# Patient Record
Sex: Male | Born: 1975 | Race: Black or African American | Hispanic: No | Marital: Single | Smoking: Current every day smoker
Health system: Southern US, Community
[De-identification: ages and names within clinical notes are randomized; demographics above are authoritative.]

## PROBLEM LIST (undated history)

## (undated) DIAGNOSIS — I10 Essential (primary) hypertension: Secondary | ICD-10-CM

## (undated) DIAGNOSIS — E119 Type 2 diabetes mellitus without complications: Secondary | ICD-10-CM

## (undated) DIAGNOSIS — K859 Acute pancreatitis without necrosis or infection, unspecified: Secondary | ICD-10-CM

## (undated) HISTORY — PX: CHOLECYSTECTOMY: SHX55

---

## 2007-03-17 ENCOUNTER — Emergency Department (HOSPITAL_COMMUNITY): Admission: EM | Admit: 2007-03-17 | Discharge: 2007-03-17 | Payer: Self-pay | Admitting: Emergency Medicine

## 2007-06-23 ENCOUNTER — Emergency Department (HOSPITAL_COMMUNITY): Admission: EM | Admit: 2007-06-23 | Discharge: 2007-06-23 | Payer: Self-pay | Admitting: Emergency Medicine

## 2007-09-03 ENCOUNTER — Emergency Department (HOSPITAL_COMMUNITY): Admission: EM | Admit: 2007-09-03 | Discharge: 2007-09-03 | Payer: Self-pay | Admitting: Emergency Medicine

## 2009-04-30 ENCOUNTER — Ambulatory Visit: Payer: Self-pay | Admitting: Pulmonary Disease

## 2009-04-30 DIAGNOSIS — Z8669 Personal history of other diseases of the nervous system and sense organs: Secondary | ICD-10-CM

## 2009-04-30 DIAGNOSIS — G4733 Obstructive sleep apnea (adult) (pediatric): Secondary | ICD-10-CM | POA: Insufficient documentation

## 2009-05-01 ENCOUNTER — Telehealth: Payer: Self-pay | Admitting: Pulmonary Disease

## 2009-05-06 ENCOUNTER — Encounter: Payer: Self-pay | Admitting: Pulmonary Disease

## 2009-05-07 ENCOUNTER — Telehealth: Payer: Self-pay | Admitting: Pulmonary Disease

## 2009-05-09 ENCOUNTER — Ambulatory Visit: Payer: Self-pay | Admitting: Pulmonary Disease

## 2009-05-15 ENCOUNTER — Ambulatory Visit: Payer: Self-pay | Admitting: Pulmonary Disease

## 2009-09-03 ENCOUNTER — Emergency Department (HOSPITAL_COMMUNITY): Admission: EM | Admit: 2009-09-03 | Discharge: 2009-09-03 | Payer: Self-pay | Admitting: Family Medicine

## 2011-05-02 LAB — RAPID URINE DRUG SCREEN, HOSP PERFORMED: Tetrahydrocannabinol: NOT DETECTED

## 2011-05-02 LAB — I-STAT 8, (EC8 V) (CONVERTED LAB)
BUN: 9
Glucose, Bld: 112 — ABNORMAL HIGH
Potassium: 4.2
Sodium: 140
pH, Ven: 7.394 — ABNORMAL HIGH

## 2011-05-02 LAB — POCT CARDIAC MARKERS
CKMB, poc: 2.3
Myoglobin, poc: 114
Operator id: 234501

## 2011-05-02 LAB — POCT I-STAT CREATININE: Operator id: 234501

## 2015-04-04 ENCOUNTER — Emergency Department (HOSPITAL_BASED_OUTPATIENT_CLINIC_OR_DEPARTMENT_OTHER)
Admission: EM | Admit: 2015-04-04 | Discharge: 2015-04-04 | Disposition: A | Discharge status: Home / Self Care | Payer: Medicaid Other | Attending: Emergency Medicine | Admitting: Emergency Medicine

## 2015-04-04 ENCOUNTER — Encounter (HOSPITAL_BASED_OUTPATIENT_CLINIC_OR_DEPARTMENT_OTHER): Payer: Self-pay

## 2015-04-04 DIAGNOSIS — R531 Weakness: Secondary | ICD-10-CM | POA: Insufficient documentation

## 2015-04-04 DIAGNOSIS — E119 Type 2 diabetes mellitus without complications: Secondary | ICD-10-CM | POA: Diagnosis not present

## 2015-04-04 DIAGNOSIS — Z8719 Personal history of other diseases of the digestive system: Secondary | ICD-10-CM | POA: Insufficient documentation

## 2015-04-04 DIAGNOSIS — M5441 Lumbago with sciatica, right side: Secondary | ICD-10-CM | POA: Diagnosis not present

## 2015-04-04 DIAGNOSIS — I10 Essential (primary) hypertension: Secondary | ICD-10-CM | POA: Diagnosis not present

## 2015-04-04 DIAGNOSIS — R350 Frequency of micturition: Secondary | ICD-10-CM | POA: Diagnosis not present

## 2015-04-04 DIAGNOSIS — Z720 Tobacco use: Secondary | ICD-10-CM | POA: Diagnosis not present

## 2015-04-04 DIAGNOSIS — Z79899 Other long term (current) drug therapy: Secondary | ICD-10-CM | POA: Diagnosis not present

## 2015-04-04 DIAGNOSIS — G8929 Other chronic pain: Secondary | ICD-10-CM | POA: Insufficient documentation

## 2015-04-04 DIAGNOSIS — M549 Dorsalgia, unspecified: Secondary | ICD-10-CM | POA: Diagnosis present

## 2015-04-04 HISTORY — DX: Essential (primary) hypertension: I10

## 2015-04-04 HISTORY — DX: Acute pancreatitis without necrosis or infection, unspecified: K85.90

## 2015-04-04 HISTORY — DX: Type 2 diabetes mellitus without complications: E11.9

## 2015-04-04 LAB — URINALYSIS, ROUTINE W REFLEX MICROSCOPIC
Bilirubin Urine: NEGATIVE
Glucose, UA: NEGATIVE mg/dL
Hgb urine dipstick: NEGATIVE
KETONES UR: NEGATIVE mg/dL
NITRITE: NEGATIVE
PH: 7 (ref 5.0–8.0)
Protein, ur: NEGATIVE mg/dL
Specific Gravity, Urine: 1.028 (ref 1.005–1.030)
Urobilinogen, UA: 1 mg/dL (ref 0.0–1.0)

## 2015-04-04 LAB — URINE MICROSCOPIC-ADD ON

## 2015-04-04 LAB — CBG MONITORING, ED: GLUCOSE-CAPILLARY: 114 mg/dL — AB (ref 65–99)

## 2015-04-04 MED ORDER — PREDNISONE 20 MG PO TABS: 40.0000 mg | ORAL_TABLET | Freq: Every day | ORAL | Status: DC

## 2015-04-04 NOTE — Discharge Instructions (Signed)
Back Pain, Adult °Back pain is very common. The pain often gets better over time. The cause of back pain is usually not dangerous. Most people can learn to manage their back pain on their own.  °HOME CARE  °· Stay active. Start with short walks on flat ground if you can. Try to walk farther each day. °· Do not sit, drive, or stand in one place for more than 30 minutes. Do not stay in bed. °· Do not avoid exercise or work. Activity can help your back heal faster. °· Be careful when you bend or lift an object. Bend at your knees, keep the object close to you, and do not twist. °· Sleep on a firm mattress. Lie on your side, and bend your knees. If you lie on your back, put a pillow under your knees. °· Only take medicines as told by your doctor. °· Put ice on the injured area. °¨ Put ice in a plastic bag. °¨ Place a towel between your skin and the bag. °¨ Leave the ice on for 15-20 minutes, 03-04 times a day for the first 2 to 3 days. After that, you can switch between ice and heat packs. °· Ask your doctor about back exercises or massage. °· Avoid feeling anxious or stressed. Find good ways to deal with stress, such as exercise. °GET HELP RIGHT AWAY IF:  °· Your pain does not go away with rest or medicine. °· Your pain does not go away in 1 week. °· You have new problems. °· You do not feel well. °· The pain spreads into your legs. °· You cannot control when you poop (bowel movement) or pee (urinate). °· Your arms or legs feel weak or lose feeling (numbness). °· You feel sick to your stomach (nauseous) or throw up (vomit). °· You have belly (abdominal) pain. °· You feel like you may pass out (faint). °MAKE SURE YOU:  °· Understand these instructions. °· Will watch your condition. °· Will get help right away if you are not doing well or get worse. °Document Released: 12/24/2007 Document Revised: 09/29/2011 Document Reviewed: 11/08/2013 °[employer]® Patient Information ©2015 [employer]. This information is not intended  to replace advice given to you by your health care provider. Make sure you discuss any questions you have with your health care provider. ° °

## 2015-04-04 NOTE — ED Notes (Signed)
MD at bedside. 

## 2015-04-04 NOTE — ED Notes (Signed)
Pt reports lower back pain "for awhile".  Reports getting worse. Pt reports pain with walking, pain with laying flat. Denies any known injury.  Reports pain radiates down right thigh,

## 2015-04-04 NOTE — ED Provider Notes (Signed)
CSN: [id number]     Arrival date & time 04/04/15  1004 History   First MD Initiated Contact with Patient 04/04/15 1057     Chief Complaint  Patient presents with  . Back Pain     (Consider location/radiation/quality/duration/timing/severity/associated sxs/prior Treatment) Patient is a 39 y.o. male presenting with back pain. The history is provided by the patient.  Back Pain Associated symptoms: numbness and weakness   Associated symptoms: no abdominal pain, no chest pain and no headaches    patient presents with back pain. Acute on chronic. Has been worse over the last week or 2. Since he's not been able to do much because he has to take care of his 6 children. States he has had back problems before. No new injury. States he has been up walking around more than he had recently. No abdominal pain. States he has been urinating more frequently. States he's not been checking his sugars much. States the pain radiates down his right leg. Worse after walking. States it limits his physical activity somewhat. No fevers or chills. No loss of bladder bowel control.  Past Medical History  Diagnosis Date  . Hypertension   . Diabetes mellitus without complication   . Pancreatitis    Past Surgical History  Procedure Laterality Date  . Cholecystectomy     No family history on file. Social History  Substance Use Topics  . Smoking status: Current Every Day Smoker -- 1.00 packs/day    Types: Cigarettes  . Smokeless tobacco: None  . Alcohol Use: No    Review of Systems  Constitutional: Negative for activity change and appetite change.  Eyes: Negative for pain.  Respiratory: Negative for chest tightness and shortness of breath.   Cardiovascular: Negative for chest pain and leg swelling.  Gastrointestinal: Negative for nausea, abdominal pain and diarrhea.  Genitourinary: Positive for frequency. Negative for flank pain.  Musculoskeletal: Positive for back pain. Negative for neck stiffness.  Skin:  Negative for rash.  Neurological: Positive for weakness and numbness. Negative for headaches.  Psychiatric/Behavioral: Negative for behavioral problems.      Allergies  Hydrocodone  Home Medications   Prior to Admission medications   Medication Sig Start Date End Date Taking? Authorizing Provider  LABETALOL HCL PO Take by mouth.   Yes Historical Provider, MD  METFORMIN HCL ER, MOD, PO Take by mouth.   Yes Historical Provider, MD  METOPROLOL SUCCINATE ER PO Take by mouth.   Yes Historical Provider, MD  predniSONE (DELTASONE) 20 MG tablet Take 2 tablets (40 mg total) by mouth daily. 04/04/15   Davonna Belling, MD   BP 136/87 mmHg  Pulse 73  Temp(Src) 98.1 F (36.7 C) (Oral)  Resp 18  Ht 5\' 9"  (1.753 m)  Wt 320 lb (145.151 kg)  BMI 47.23 kg/m2  SpO2 100% Physical Exam  Constitutional: He appears well-developed.  Patient is morbidly obese  HENT:  Head: Atraumatic.  Neck: Neck supple.  Cardiovascular: Normal rate.   Pulmonary/Chest: Effort normal.  Abdominal: Soft.  Musculoskeletal: He exhibits tenderness.  Lumbar tenderness on the midline without step-off deformity. Neurovascularly intact over bilateral lower extremities. Good pulse in right foot. Good capillary refill.  Neurological: He is alert.  Skin: Skin is warm.    ED Course  Procedures (including critical care time) Labs Review Labs Reviewed  URINALYSIS, ROUTINE W REFLEX MICROSCOPIC (NOT AT [facility]) - Abnormal; Notable for the following:    Leukocytes, UA TRACE (*)    All other components within normal limits  CBG MONITORING, ED - Abnormal; Notable for the following:    Glucose-Capillary 114 (*)    All other components within normal limits  URINE MICROSCOPIC-ADD ON    Imaging Review No results found. I have personally reviewed and evaluated these images and lab results as part of my medical decision-making.   EKG Interpretation None      MDM   Final diagnoses:  Right-sided low back pain with  right-sided sciatica    Patient with back pain. Some radicular component to it. No red flags. There is some dysuria but negative urine. Will discharge home with short course of steroids.    Davonna Belling, MD 04/04/15 1233

## 2018-03-12 ENCOUNTER — Other Ambulatory Visit: Payer: Self-pay

## 2018-03-12 ENCOUNTER — Encounter (HOSPITAL_BASED_OUTPATIENT_CLINIC_OR_DEPARTMENT_OTHER): Payer: Self-pay | Admitting: *Deleted

## 2018-03-12 ENCOUNTER — Emergency Department (HOSPITAL_BASED_OUTPATIENT_CLINIC_OR_DEPARTMENT_OTHER)
Admission: EM | Admit: 2018-03-12 | Discharge: 2018-03-12 | Disposition: A | Discharge status: Home / Self Care | Payer: Medicaid Other | Attending: Emergency Medicine | Admitting: Emergency Medicine

## 2018-03-12 DIAGNOSIS — M545 Low back pain, unspecified: Secondary | ICD-10-CM

## 2018-03-12 DIAGNOSIS — G8929 Other chronic pain: Secondary | ICD-10-CM | POA: Diagnosis not present

## 2018-03-12 DIAGNOSIS — Z7984 Long term (current) use of oral hypoglycemic drugs: Secondary | ICD-10-CM | POA: Diagnosis not present

## 2018-03-12 DIAGNOSIS — I10 Essential (primary) hypertension: Secondary | ICD-10-CM | POA: Insufficient documentation

## 2018-03-12 DIAGNOSIS — E119 Type 2 diabetes mellitus without complications: Secondary | ICD-10-CM | POA: Insufficient documentation

## 2018-03-12 DIAGNOSIS — F17210 Nicotine dependence, cigarettes, uncomplicated: Secondary | ICD-10-CM | POA: Diagnosis not present

## 2018-03-12 LAB — CBG MONITORING, ED: GLUCOSE-CAPILLARY: 154 mg/dL — AB (ref 70–99)

## 2018-03-12 MED ORDER — MELOXICAM 15 MG PO TABS: 15.0000 mg | ORAL_TABLET | Freq: Every day | ORAL | 0 refills | Status: DC

## 2018-03-12 MED ORDER — CYCLOBENZAPRINE HCL 10 MG PO TABS: 5.0000 mg | ORAL_TABLET | Freq: Two times a day (BID) | ORAL | 0 refills | Status: DC | PRN

## 2018-03-12 MED ORDER — KETOROLAC TROMETHAMINE 60 MG/2ML IM SOLN
60.0000 mg | Freq: Once | INTRAMUSCULAR | Status: AC
  Administered 2018-03-12: 60 mg via INTRAMUSCULAR
  Filled 2018-03-12: qty 2

## 2018-03-12 MED FILL — MELOXICAM 15 MG TABLET: 15 | 10 days supply | Qty: 10 | Fill #0

## 2018-03-12 MED FILL — CYCLOBENZAPRINE HCL 10 MG T: 10 | 10 days supply | Qty: 20 | Fill #0

## 2018-03-12 NOTE — ED Notes (Signed)
Pt appears to be more alert when reassessing. Reports he is able to drive. Pt and this RN walked down hallway to assess gait and alertness. Pt talking in clear complete sentences. Did not appear to be drowsy. D/c instructions given to pt and pt 40yo daughter. Pt d/c.

## 2018-03-12 NOTE — Discharge Instructions (Signed)
SEEK IMMEDIATE MEDICAL ATTENTION IF: New numbness, tingling, weakness, or problem with the use of your arms or legs.  Severe back pain not relieved with medications.  Change in bowel or bladder control.  Increasing pain in any areas of the body (such as chest or abdominal pain).  Shortness of breath, dizziness or fainting.  Nausea (feeling sick to your stomach), vomiting, fever, or sweats.  

## 2018-03-12 NOTE — ED Notes (Signed)
Patient desat while snoring into mid 80s.  Placed on 2l/m [place]

## 2018-03-12 NOTE — ED Notes (Addendum)
Attempted to discharge pt at 1320- pt very drowsy. Unable to call a ride. Pt helped to side of bed and given a snack. Lights turned on. Pt to be discharged when more alert.

## 2018-03-12 NOTE — ED Notes (Signed)
Pt educated about hypertension.

## 2018-03-12 NOTE — ED Provider Notes (Signed)
[facility] EMERGENCY DEPARTMENT Provider Note   CSN: [id number] Arrival date & time: 03/12/18  1116     History   Chief Complaint Chief Complaint  Patient presents with  . Back Pain    HPI Samuel Chavez is a 42 y.o. male with a history of hypertension, morbid obesity and diabetes who presents the emergency department with acute on chronic back pain exacerbation.  The patient states that he has known degenerative disc disease and bulging disc with sciatica.  He has had pain for very long time and has been evaluated previously by neurosurgery.  Patient states that on a regular basis he is only able to walk for about 30 minutes at a time before he gets significant pain and numbness down his legs that makes it have to stop and sit down.  He is on current chronic pain management and is also working with the bariatric clinic to try and reduce his weight.  The patient states that this morning when he was trying to get up out of his bed his legs went numb and he was unable to stand up on them.  His symptoms have resolved and he is feeling somewhat better.  He states that he usually has about 10 minutes of gel time when he is been at rest for a while.  His pain is worse with standing for long periods of time or sitting in certain positions for long periods of time.  He states that he stays in the bed most of the day due to his pain.  He denies weakness of the lower extremities numbness, saddle anesthesia, recent procedures to his back, or loss of control of his bowel or bladder.  He is a current daily smoker.  I have reviewed the patient's chart from [facility] and care everywhere including recent visits and imaging of his lumbar spine. HPI  Past Medical History:  Diagnosis Date  . Diabetes mellitus without complication ([place])   . Hypertension   . Pancreatitis     Patient Active Problem List   Diagnosis Date Noted  . OBSTRUCTIVE SLEEP APNEA 04/30/2009  . 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 CHOLECYSTECTOMY          Home Medications    Prior to Admission medications   Medication Sig Start Date End Date Taking? Authorizing Provider  LABETALOL HCL PO Take by mouth.   Yes [provider]  METFORMIN HCL ER, MOD, PO Take by mouth.   Yes [provider]  METOPROLOL SUCCINATE ER PO Take by mouth.   Yes [provider]  predniSONE (DELTASONE) 20 MG tablet Take 2 tablets (40 mg total) by mouth daily. 04/04/15   Davonna Belling, MD    Family History No family history on file.  Social History Social History   Tobacco Use  . Smoking status: Current Every Day Smoker    Packs/day: 1.00    Types: Cigarettes  . Smokeless tobacco: Never Used  Substance Use Topics  . Alcohol use: No  . Drug use: No     Allergies   Hydrocodone   Review of Systems Review of Systems Ten systems reviewed and are negative for acute change, except as noted in the HPI.    Physical Exam Updated Vital Signs BP (!) 207/137 Comment: States he did not take his BP medication today.  Pulse (!) 102   Temp 98.3 F (36.8 C) (Oral)  Resp 18   Ht 5\' 9"  (1.753 m)   Wt (!) 154.2 kg   SpO2 97%   BMI 50.21 kg/m   Physical Exam  Constitutional: He appears well-developed and well-nourished. No distress.  HENT:  Head: Normocephalic and atraumatic.  Eyes: Conjunctivae are normal. No scleral icterus.  Neck: Normal range of motion. Neck supple.  Cardiovascular: Normal rate, regular rhythm and normal heart sounds.  Pulmonary/Chest: Effort normal and breath sounds normal. No respiratory distress.  Abdominal: Soft. There is no tenderness.  Musculoskeletal: He exhibits no edema.  No midline spinal tenderness.  Tenderness in the lumbar paraspinal muscles.  Negative straight leg test.  Patient is able to ambulate and move but has limited range of motion due to pain.  His left dorsalis pedis is 2+, 1+ in the  left tibialis posterior.  Unable to palpate pulses in the right foot but both are audible Doppler.   Neurological: He is alert.  Skin: Skin is warm and dry. He is not diaphoretic.  Psychiatric: His behavior is normal.  Nursing note and vitals reviewed.    ED Treatments / Results  Labs (all labs ordered are listed, but only abnormal results are displayed) Labs Reviewed - No data to display  EKG None  Radiology No results found.  Procedures Procedures (including critical care time)  Medications Ordered in ED Medications - No data to display   Initial Impression / Assessment and Plan / ED Course  I have reviewed the triage vital signs and the nursing notes.  Pertinent labs & imaging results that were available during my care of the patient were reviewed by me and considered in my medical decision making (see chart for details).     Patient with back pain.  No neurological deficits and normal neuro exam.  Patient can walk but states is painful.  No loss of bowel or bladder control.  No concern for cauda equina.  No fever, night sweats, weight loss, h/o cancer, IVDU.  RICE protocol and pain medicine indicated and discussed with patient.    Final Clinical Impressions(s) / ED Diagnoses   Final diagnoses:  Acute exacerbation of chronic low back pain  Hypertension, unspecified type    ED Discharge Orders    None       Margarita Mail, PA-C 03/12/18 1316    Long, [facility], MD 03/12/18 1820

## 2018-03-12 NOTE — ED Notes (Signed)
Pedal and posterior tibial pulses checked on right with doppler. Both pulses found

## 2018-03-12 NOTE — ED Triage Notes (Signed)
Lower back pain this am. States he has a hx of a pinched nerve in his lower back.

## 2018-12-16 ENCOUNTER — Emergency Department (HOSPITAL_COMMUNITY): Payer: Medicaid Other

## 2018-12-16 ENCOUNTER — Emergency Department (HOSPITAL_COMMUNITY)
Admission: EM | Admit: 2018-12-16 | Discharge: 2018-12-16 | Disposition: A | Discharge status: Home / Self Care | Payer: Medicaid Other | Attending: Emergency Medicine | Admitting: Emergency Medicine

## 2018-12-16 DIAGNOSIS — R000 Tachycardia, unspecified: Secondary | ICD-10-CM | POA: Diagnosis not present

## 2018-12-16 DIAGNOSIS — Y999 Unspecified external cause status: Secondary | ICD-10-CM | POA: Diagnosis not present

## 2018-12-16 DIAGNOSIS — S39012A Strain of muscle, fascia and tendon of lower back, initial encounter: Secondary | ICD-10-CM | POA: Diagnosis not present

## 2018-12-16 DIAGNOSIS — Y929 Unspecified place or not applicable: Secondary | ICD-10-CM | POA: Diagnosis not present

## 2018-12-16 DIAGNOSIS — S81811A Laceration without foreign body, right lower leg, initial encounter: Secondary | ICD-10-CM | POA: Insufficient documentation

## 2018-12-16 DIAGNOSIS — S91311A Laceration without foreign body, right foot, initial encounter: Secondary | ICD-10-CM

## 2018-12-16 DIAGNOSIS — T07XXXA Unspecified multiple injuries, initial encounter: Secondary | ICD-10-CM | POA: Diagnosis present

## 2018-12-16 DIAGNOSIS — Y939 Activity, unspecified: Secondary | ICD-10-CM | POA: Diagnosis not present

## 2018-12-16 DIAGNOSIS — S91209A Unspecified open wound of unspecified toe(s) with damage to nail, initial encounter: Secondary | ICD-10-CM

## 2018-12-16 DIAGNOSIS — R4182 Altered mental status, unspecified: Secondary | ICD-10-CM | POA: Insufficient documentation

## 2018-12-16 LAB — COMPREHENSIVE METABOLIC PANEL
ALT: 45 U/L — ABNORMAL HIGH (ref 0–44)
AST: 26 U/L (ref 15–41)
Albumin: 3.8 g/dL (ref 3.5–5.0)
Alkaline Phosphatase: 71 U/L (ref 38–126)
Anion gap: 13 (ref 5–15)
BUN: 10 mg/dL (ref 6–20)
CO2: 21 mmol/L — ABNORMAL LOW (ref 22–32)
Calcium: 9 mg/dL (ref 8.9–10.3)
Chloride: 103 mmol/L (ref 98–111)
Creatinine, Ser: 1.08 mg/dL (ref 0.61–1.24)
GFR calc Af Amer: >60 mL/min (ref >60)
GFR calc non Af Amer: >60 mL/min (ref >60)
Glucose, Bld: 199 mg/dL — ABNORMAL HIGH (ref 70–99)
Potassium: 3.5 mmol/L (ref 3.5–5.1)
Sodium: 137 mmol/L (ref 135–145)
Total Bilirubin: 0.8 mg/dL (ref 0.3–1.2)
Total Protein: 6.9 g/dL (ref 6.5–8.1)

## 2018-12-16 LAB — CBC WITH DIFFERENTIAL/PLATELET
Abs Immature Granulocytes: 0.02 10*3/uL (ref 0.00–0.07)
Basophils Absolute: 0.1 10*3/uL (ref 0.0–0.1)
Basophils Relative: 1 %
Eosinophils Absolute: 0.4 10*3/uL (ref 0.0–0.5)
Eosinophils Relative: 6 %
HCT: 44.9 % (ref 39.0–52.0)
Hemoglobin: 14.1 g/dL (ref 13.0–17.0)
Immature Granulocytes: 0 %
Lymphocytes Relative: 26 %
Lymphs Abs: 1.8 10*3/uL (ref 0.7–4.0)
MCH: 27.5 pg (ref 26.0–34.0)
MCHC: 31.4 g/dL (ref 30.0–36.0)
MCV: 87.7 fL (ref 80.0–100.0)
Monocytes Absolute: 0.7 10*3/uL (ref 0.1–1.0)
Monocytes Relative: 10 %
Neutro Abs: 3.8 10*3/uL (ref 1.7–7.7)
Neutrophils Relative %: 57 %
Platelets: 223 10*3/uL (ref 150–400)
RBC: 5.12 MIL/uL (ref 4.22–5.81)
RDW: 13.6 % (ref 11.5–15.5)
WBC: 6.7 10*3/uL (ref 4.0–10.5)
nRBC: 0 % (ref 0.0–0.2)

## 2018-12-16 LAB — SAMPLE TO BLOOD BANK

## 2018-12-16 LAB — ETHANOL: Alcohol, Ethyl (B): <10 mg/dL (ref <10)

## 2018-12-16 MED ORDER — SODIUM CHLORIDE 0.9 % IV BOLUS
500.0000 mL | Freq: Once | INTRAVENOUS | Status: AC
  Administered 2018-12-16: 500 mL via INTRAVENOUS

## 2018-12-16 MED ORDER — CEPHALEXIN 500 MG PO CAPS: 500.0000 mg | ORAL_CAPSULE | Freq: Four times a day (QID) | ORAL | 0 refills | Status: DC

## 2018-12-16 MED ORDER — FENTANYL CITRATE (PF) 100 MCG/2ML IJ SOLN
100.0000 ug | Freq: Once | INTRAMUSCULAR | Status: AC
  Administered 2018-12-16: 100 ug via INTRAVENOUS
  Filled 2018-12-16: qty 2

## 2018-12-16 MED ORDER — IOHEXOL 300 MG/ML  SOLN
100.0000 mL | Freq: Once | INTRAMUSCULAR | Status: AC | PRN
  Administered 2018-12-16: 100 mL via INTRAVENOUS

## 2018-12-16 MED ORDER — IOHEXOL 300 MG/ML  SOLN
75.0000 mL | Freq: Once | INTRAMUSCULAR | Status: AC | PRN
  Administered 2018-12-16: 75 mL via INTRAVENOUS

## 2018-12-16 MED ORDER — OXYCODONE-ACETAMINOPHEN 5-325 MG PO TABS: 1.0000 | ORAL_TABLET | Freq: Three times a day (TID) | ORAL | 0 refills | Status: DC | PRN

## 2018-12-16 MED ORDER — FENTANYL CITRATE (PF) 100 MCG/2ML IJ SOLN
100.0000 ug | Freq: Once | INTRAMUSCULAR | Status: AC
  Administered 2018-12-16: 17:00:00 100 ug via INTRAVENOUS
  Filled 2018-12-16: qty 2

## 2018-12-16 MED ORDER — LIDOCAINE HCL (PF) 1 % IJ SOLN
5.0000 mL | Freq: Once | INTRAMUSCULAR | Status: AC
  Administered 2018-12-16: 5 mL
  Filled 2018-12-16: qty 5

## 2018-12-16 NOTE — Discharge Instructions (Addendum)
Have the stitches out in 10 days.

## 2018-12-16 NOTE — Progress Notes (Signed)
Orthopedic Tech Progress Note Patient Details:  Samuel Chavez 07/21/1873 [id number] Level 2 trauma. Maybe needed Patient ID: Samuel Chavez, male   DOB: 07/21/1873, 43 y.o.   MRN: [id number]   [name] 12/16/2018, 4:00 PM

## 2018-12-16 NOTE — ED Notes (Signed)
Pt being taken back to CT to complete CT of chest

## 2018-12-16 NOTE — ED Notes (Signed)
Patient's dressing reinforced prior to discharge.

## 2018-12-16 NOTE — ED Notes (Signed)
Pt took c collar off refuses to keep it on states " my neck is fine I'm not keeping it on".

## 2018-12-16 NOTE — ED Notes (Signed)
Pt being taken to CT.
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
## 2022-02-11 ENCOUNTER — Other Ambulatory Visit: Payer: Self-pay

## 2022-02-11 ENCOUNTER — Inpatient Hospital Stay: Admit: 2022-02-11 | Payer: Medicaid Other

## 2022-02-11 ENCOUNTER — Inpatient Hospital Stay (HOSPITAL_COMMUNITY)
Admit: 2022-02-11 | Discharge: 2022-02-11 | Disposition: A | Discharge status: Home / Self Care | Payer: Medicaid Other | Attending: Family Medicine | Admitting: Family Medicine

## 2022-02-11 ENCOUNTER — Emergency Department: Payer: Medicaid Other

## 2022-02-11 ENCOUNTER — Inpatient Hospital Stay: Payer: Medicaid Other

## 2022-02-11 ENCOUNTER — Inpatient Hospital Stay
Admission: EM | Admit: 2022-02-11 | Discharge: 2022-04-07 | DRG: 003 | Disposition: A | Discharge status: Rehabilitation Facility | Payer: Medicaid Other | Attending: Internal Medicine | Admitting: Internal Medicine

## 2022-02-11 DIAGNOSIS — G6281 Critical illness polyneuropathy: Secondary | ICD-10-CM | POA: Diagnosis not present

## 2022-02-11 DIAGNOSIS — F32A Depression, unspecified: Secondary | ICD-10-CM | POA: Diagnosis not present

## 2022-02-11 DIAGNOSIS — E875 Hyperkalemia: Secondary | ICD-10-CM | POA: Diagnosis present

## 2022-02-11 DIAGNOSIS — Z7984 Long term (current) use of oral hypoglycemic drugs: Secondary | ICD-10-CM

## 2022-02-11 DIAGNOSIS — N189 Chronic kidney disease, unspecified: Secondary | ICD-10-CM | POA: Diagnosis not present

## 2022-02-11 DIAGNOSIS — E662 Morbid (severe) obesity with alveolar hypoventilation: Secondary | ICD-10-CM | POA: Diagnosis present

## 2022-02-11 DIAGNOSIS — J15212 Pneumonia due to Methicillin resistant Staphylococcus aureus: Secondary | ICD-10-CM | POA: Diagnosis not present

## 2022-02-11 DIAGNOSIS — Q382 Macroglossia: Secondary | ICD-10-CM

## 2022-02-11 DIAGNOSIS — Z6841 Body Mass Index (BMI) 40.0 and over, adult: Secondary | ICD-10-CM

## 2022-02-11 DIAGNOSIS — G510 Bell's palsy: Secondary | ICD-10-CM | POA: Diagnosis present

## 2022-02-11 DIAGNOSIS — M25561 Pain in right knee: Secondary | ICD-10-CM | POA: Diagnosis not present

## 2022-02-11 DIAGNOSIS — N184 Chronic kidney disease, stage 4 (severe): Secondary | ICD-10-CM | POA: Diagnosis present

## 2022-02-11 DIAGNOSIS — I160 Hypertensive urgency: Secondary | ICD-10-CM | POA: Diagnosis not present

## 2022-02-11 DIAGNOSIS — I5043 Acute on chronic combined systolic (congestive) and diastolic (congestive) heart failure: Secondary | ICD-10-CM | POA: Diagnosis present

## 2022-02-11 DIAGNOSIS — R609 Edema, unspecified: Secondary | ICD-10-CM

## 2022-02-11 DIAGNOSIS — E1169 Type 2 diabetes mellitus with other specified complication: Secondary | ICD-10-CM | POA: Diagnosis not present

## 2022-02-11 DIAGNOSIS — I161 Hypertensive emergency: Secondary | ICD-10-CM | POA: Diagnosis present

## 2022-02-11 DIAGNOSIS — I5023 Acute on chronic systolic (congestive) heart failure: Secondary | ICD-10-CM | POA: Diagnosis not present

## 2022-02-11 DIAGNOSIS — J9602 Acute respiratory failure with hypercapnia: Secondary | ICD-10-CM | POA: Diagnosis not present

## 2022-02-11 DIAGNOSIS — Y848 Other medical procedures as the cause of abnormal reaction of the patient, or of later complication, without mention of misadventure at the time of the procedure: Secondary | ICD-10-CM | POA: Diagnosis not present

## 2022-02-11 DIAGNOSIS — J80 Acute respiratory distress syndrome: Secondary | ICD-10-CM | POA: Diagnosis present

## 2022-02-11 DIAGNOSIS — J14 Pneumonia due to Hemophilus influenzae: Secondary | ICD-10-CM | POA: Diagnosis present

## 2022-02-11 DIAGNOSIS — Z992 Dependence on renal dialysis: Secondary | ICD-10-CM

## 2022-02-11 DIAGNOSIS — R34 Anuria and oliguria: Secondary | ICD-10-CM | POA: Diagnosis not present

## 2022-02-11 DIAGNOSIS — G7281 Critical illness myopathy: Secondary | ICD-10-CM | POA: Diagnosis not present

## 2022-02-11 DIAGNOSIS — E119 Type 2 diabetes mellitus without complications: Secondary | ICD-10-CM

## 2022-02-11 DIAGNOSIS — J440 Chronic obstructive pulmonary disease with acute lower respiratory infection: Secondary | ICD-10-CM | POA: Diagnosis present

## 2022-02-11 DIAGNOSIS — E8721 Acute metabolic acidosis: Secondary | ICD-10-CM | POA: Diagnosis present

## 2022-02-11 DIAGNOSIS — I5022 Chronic systolic (congestive) heart failure: Secondary | ICD-10-CM | POA: Diagnosis not present

## 2022-02-11 DIAGNOSIS — J13 Pneumonia due to Streptococcus pneumoniae: Secondary | ICD-10-CM | POA: Diagnosis present

## 2022-02-11 DIAGNOSIS — Z91148 Patient's other noncompliance with medication regimen for other reason: Secondary | ICD-10-CM

## 2022-02-11 DIAGNOSIS — I82A11 Acute embolism and thrombosis of right axillary vein: Secondary | ICD-10-CM | POA: Diagnosis not present

## 2022-02-11 DIAGNOSIS — K5900 Constipation, unspecified: Secondary | ICD-10-CM | POA: Diagnosis not present

## 2022-02-11 DIAGNOSIS — R1013 Epigastric pain: Secondary | ICD-10-CM | POA: Diagnosis not present

## 2022-02-11 DIAGNOSIS — H6593 Unspecified nonsuppurative otitis media, bilateral: Secondary | ICD-10-CM | POA: Diagnosis not present

## 2022-02-11 DIAGNOSIS — I272 Pulmonary hypertension, unspecified: Secondary | ICD-10-CM | POA: Diagnosis present

## 2022-02-11 DIAGNOSIS — N179 Acute kidney failure, unspecified: Secondary | ICD-10-CM | POA: Diagnosis present

## 2022-02-11 DIAGNOSIS — I130 Hypertensive heart and chronic kidney disease with heart failure and stage 1 through stage 4 chronic kidney disease, or unspecified chronic kidney disease: Secondary | ICD-10-CM | POA: Diagnosis present

## 2022-02-11 DIAGNOSIS — G44209 Tension-type headache, unspecified, not intractable: Secondary | ICD-10-CM | POA: Diagnosis not present

## 2022-02-11 DIAGNOSIS — Y95 Nosocomial condition: Secondary | ICD-10-CM | POA: Diagnosis not present

## 2022-02-11 DIAGNOSIS — J189 Pneumonia, unspecified organism: Secondary | ICD-10-CM

## 2022-02-11 DIAGNOSIS — B9689 Other specified bacterial agents as the cause of diseases classified elsewhere: Secondary | ICD-10-CM | POA: Diagnosis not present

## 2022-02-11 DIAGNOSIS — A409 Streptococcal sepsis, unspecified: Principal | ICD-10-CM | POA: Diagnosis present

## 2022-02-11 DIAGNOSIS — E1165 Type 2 diabetes mellitus with hyperglycemia: Secondary | ICD-10-CM | POA: Diagnosis present

## 2022-02-11 DIAGNOSIS — E1122 Type 2 diabetes mellitus with diabetic chronic kidney disease: Secondary | ICD-10-CM | POA: Diagnosis present

## 2022-02-11 DIAGNOSIS — J9600 Acute respiratory failure, unspecified whether with hypoxia or hypercapnia: Secondary | ICD-10-CM | POA: Insufficient documentation

## 2022-02-11 DIAGNOSIS — F329 Major depressive disorder, single episode, unspecified: Secondary | ICD-10-CM | POA: Diagnosis not present

## 2022-02-11 DIAGNOSIS — T4275XA Adverse effect of unspecified antiepileptic and sedative-hypnotic drugs, initial encounter: Secondary | ICD-10-CM | POA: Diagnosis not present

## 2022-02-11 DIAGNOSIS — R1310 Dysphagia, unspecified: Secondary | ICD-10-CM | POA: Diagnosis present

## 2022-02-11 DIAGNOSIS — Z20822 Contact with and (suspected) exposure to covid-19: Secondary | ICD-10-CM | POA: Diagnosis present

## 2022-02-11 DIAGNOSIS — R790 Abnormal level of blood mineral: Secondary | ICD-10-CM | POA: Diagnosis not present

## 2022-02-11 DIAGNOSIS — I5031 Acute diastolic (congestive) heart failure: Secondary | ICD-10-CM | POA: Diagnosis not present

## 2022-02-11 DIAGNOSIS — R770 Abnormality of albumin: Secondary | ICD-10-CM | POA: Diagnosis not present

## 2022-02-11 DIAGNOSIS — M5416 Radiculopathy, lumbar region: Secondary | ICD-10-CM | POA: Diagnosis present

## 2022-02-11 DIAGNOSIS — F17210 Nicotine dependence, cigarettes, uncomplicated: Secondary | ICD-10-CM | POA: Diagnosis not present

## 2022-02-11 DIAGNOSIS — Z791 Long term (current) use of non-steroidal anti-inflammatories (NSAID): Secondary | ICD-10-CM

## 2022-02-11 DIAGNOSIS — Z91199 Patient's noncompliance with other medical treatment and regimen due to unspecified reason: Secondary | ICD-10-CM

## 2022-02-11 DIAGNOSIS — J9690 Respiratory failure, unspecified, unspecified whether with hypoxia or hypercapnia: Secondary | ICD-10-CM | POA: Diagnosis present

## 2022-02-11 DIAGNOSIS — L853 Xerosis cutis: Secondary | ICD-10-CM | POA: Diagnosis not present

## 2022-02-11 DIAGNOSIS — D649 Anemia, unspecified: Secondary | ICD-10-CM | POA: Diagnosis present

## 2022-02-11 DIAGNOSIS — I952 Hypotension due to drugs: Secondary | ICD-10-CM | POA: Diagnosis not present

## 2022-02-11 DIAGNOSIS — M792 Neuralgia and neuritis, unspecified: Secondary | ICD-10-CM | POA: Diagnosis not present

## 2022-02-11 DIAGNOSIS — Z9911 Dependence on respirator [ventilator] status: Secondary | ICD-10-CM

## 2022-02-11 DIAGNOSIS — T82868A Thrombosis of vascular prosthetic devices, implants and grafts, initial encounter: Secondary | ICD-10-CM | POA: Diagnosis not present

## 2022-02-11 DIAGNOSIS — L8990 Pressure ulcer of unspecified site, unspecified stage: Secondary | ICD-10-CM | POA: Insufficient documentation

## 2022-02-11 DIAGNOSIS — A413 Sepsis due to Hemophilus influenzae: Secondary | ICD-10-CM | POA: Diagnosis present

## 2022-02-11 DIAGNOSIS — E870 Hyperosmolality and hypernatremia: Secondary | ICD-10-CM | POA: Diagnosis not present

## 2022-02-11 DIAGNOSIS — Z7985 Long-term (current) use of injectable non-insulin antidiabetic drugs: Secondary | ICD-10-CM

## 2022-02-11 DIAGNOSIS — Z79899 Other long term (current) drug therapy: Secondary | ICD-10-CM

## 2022-02-11 DIAGNOSIS — L89152 Pressure ulcer of sacral region, stage 2: Secondary | ICD-10-CM | POA: Diagnosis not present

## 2022-02-11 DIAGNOSIS — E1142 Type 2 diabetes mellitus with diabetic polyneuropathy: Secondary | ICD-10-CM | POA: Diagnosis present

## 2022-02-11 DIAGNOSIS — J328 Other chronic sinusitis: Secondary | ICD-10-CM | POA: Diagnosis present

## 2022-02-11 DIAGNOSIS — Z515 Encounter for palliative care: Secondary | ICD-10-CM | POA: Diagnosis not present

## 2022-02-11 DIAGNOSIS — G4733 Obstructive sleep apnea (adult) (pediatric): Secondary | ICD-10-CM | POA: Diagnosis not present

## 2022-02-11 DIAGNOSIS — I429 Cardiomyopathy, unspecified: Secondary | ICD-10-CM | POA: Diagnosis present

## 2022-02-11 DIAGNOSIS — J9601 Acute respiratory failure with hypoxia: Secondary | ICD-10-CM

## 2022-02-11 DIAGNOSIS — I10 Essential (primary) hypertension: Secondary | ICD-10-CM | POA: Diagnosis not present

## 2022-02-11 DIAGNOSIS — J9621 Acute and chronic respiratory failure with hypoxia: Secondary | ICD-10-CM | POA: Diagnosis not present

## 2022-02-11 DIAGNOSIS — F17200 Nicotine dependence, unspecified, uncomplicated: Secondary | ICD-10-CM | POA: Diagnosis not present

## 2022-02-11 DIAGNOSIS — J181 Lobar pneumonia, unspecified organism: Secondary | ICD-10-CM | POA: Diagnosis not present

## 2022-02-11 DIAGNOSIS — Z7189 Other specified counseling: Secondary | ICD-10-CM | POA: Diagnosis not present

## 2022-02-11 LAB — BLOOD GAS, ARTERIAL
Acid-Base Excess: 7.7 mmol/L — ABNORMAL HIGH (ref 0.0–2.0)
Bicarbonate: 34.9 mmol/L — ABNORMAL HIGH (ref 20.0–28.0)
FIO2: 100 %
MECHVT: 570 mL
O2 Saturation: 97.8 %
PEEP: 10 cmH2O
Patient temperature: 37
RATE: 20 resp/min
pCO2 arterial: 59 mmHg — ABNORMAL HIGH (ref 32–48)
pH, Arterial: 7.38 (ref 7.35–7.45)
pO2, Arterial: 83 mmHg (ref 83–108)

## 2022-02-11 LAB — TROPONIN I (HIGH SENSITIVITY): Troponin I (High Sensitivity): 24 ng/L — ABNORMAL HIGH (ref <18)

## 2022-02-11 LAB — GLUCOSE, CAPILLARY
Glucose-Capillary: 308 mg/dL — ABNORMAL HIGH (ref 70–99)
Glucose-Capillary: 338 mg/dL — ABNORMAL HIGH (ref 70–99)

## 2022-02-11 LAB — COMPREHENSIVE METABOLIC PANEL
ALT: 20 U/L (ref 0–44)
AST: 23 U/L (ref 15–41)
Albumin: 3.5 g/dL (ref 3.5–5.0)
Alkaline Phosphatase: 69 U/L (ref 38–126)
Anion gap: 12 (ref 5–15)
BUN: 14 mg/dL (ref 6–20)
CO2: 29 mmol/L (ref 22–32)
Calcium: 8.3 mg/dL — ABNORMAL LOW (ref 8.9–10.3)
Chloride: 92 mmol/L — ABNORMAL LOW (ref 98–111)
Creatinine, Ser: 1.32 mg/dL — ABNORMAL HIGH (ref 0.61–1.24)
GFR, Estimated: >60 mL/min (ref >60)
Glucose, Bld: 299 mg/dL — ABNORMAL HIGH (ref 70–99)
Potassium: 4.6 mmol/L (ref 3.5–5.1)
Sodium: 133 mmol/L — ABNORMAL LOW (ref 135–145)
Total Bilirubin: 0.8 mg/dL (ref 0.3–1.2)
Total Protein: 7.2 g/dL (ref 6.5–8.1)

## 2022-02-11 LAB — BRAIN NATRIURETIC PEPTIDE: B Natriuretic Peptide: 35.6 pg/mL (ref 0.0–100.0)

## 2022-02-11 LAB — CBC
HCT: 44.1 % (ref 39.0–52.0)
Hemoglobin: 13.2 g/dL (ref 13.0–17.0)
MCH: 26.7 pg (ref 26.0–34.0)
MCHC: 29.9 g/dL — ABNORMAL LOW (ref 30.0–36.0)
MCV: 89.3 fL (ref 80.0–100.0)
Platelets: 198 10*3/uL (ref 150–400)
RBC: 4.94 MIL/uL (ref 4.22–5.81)
RDW: 14.4 % (ref 11.5–15.5)
WBC: 7.7 10*3/uL (ref 4.0–10.5)
nRBC: 0 % (ref 0.0–0.2)

## 2022-02-11 LAB — BASIC METABOLIC PANEL
Anion gap: 7 (ref 5–15)
BUN: 11 mg/dL (ref 6–20)
CO2: 34 mmol/L — ABNORMAL HIGH (ref 22–32)
Calcium: 8.8 mg/dL — ABNORMAL LOW (ref 8.9–10.3)
Chloride: 94 mmol/L — ABNORMAL LOW (ref 98–111)
Creatinine, Ser: 0.96 mg/dL (ref 0.61–1.24)
GFR, Estimated: >60 mL/min (ref >60)
Glucose, Bld: 319 mg/dL — ABNORMAL HIGH (ref 70–99)
Potassium: 4.1 mmol/L (ref 3.5–5.1)
Sodium: 135 mmol/L (ref 135–145)

## 2022-02-11 LAB — MAGNESIUM: Magnesium: 1.6 mg/dL — ABNORMAL LOW (ref 1.7–2.4)

## 2022-02-11 LAB — T4, FREE: Free T4: 1.08 ng/dL (ref 0.61–1.12)

## 2022-02-11 LAB — MRSA NEXT GEN BY PCR, NASAL: MRSA by PCR Next Gen: NOT DETECTED

## 2022-02-11 LAB — LACTIC ACID, PLASMA: Lactic Acid, Venous: 4 mmol/L (ref 0.5–1.9)

## 2022-02-11 LAB — SARS CORONAVIRUS 2 BY RT PCR: SARS Coronavirus 2 by RT PCR: NEGATIVE

## 2022-02-11 LAB — CBG MONITORING, ED: Glucose-Capillary: 359 mg/dL — ABNORMAL HIGH (ref 70–99)

## 2022-02-11 LAB — TSH: TSH: 0.745 u[IU]/mL (ref 0.350–4.500)

## 2022-02-11 MED ORDER — KETAMINE HCL 10 MG/ML IJ SOLN
INTRAMUSCULAR | Status: AC
  Filled 2022-02-11: qty 1

## 2022-02-11 MED ORDER — TRAZODONE HCL 50 MG PO TABS: 25.0000 mg | ORAL_TABLET | Freq: Every evening | ORAL | Status: DC | PRN

## 2022-02-11 MED ORDER — IPRATROPIUM-ALBUTEROL 0.5-2.5 (3) MG/3ML IN SOLN
3.0000 mL | RESPIRATORY_TRACT | Status: DC
  Administered 2022-02-11 – 2022-02-12 (×2): 3 mL via RESPIRATORY_TRACT
  Filled 2022-02-11 (×2): qty 3

## 2022-02-11 MED ORDER — ROCURONIUM BROMIDE 10 MG/ML (PF) SYRINGE
PREFILLED_SYRINGE | INTRAVENOUS | Status: AC
  Filled 2022-02-11: qty 10

## 2022-02-11 MED ORDER — METHYLPREDNISOLONE SODIUM SUCC 40 MG IJ SOLR
40.0000 mg | Freq: Two times a day (BID) | INTRAMUSCULAR | Status: DC
  Administered 2022-02-11 – 2022-02-13 (×4): 40 mg via INTRAVENOUS
  Filled 2022-02-11 (×4): qty 1

## 2022-02-11 MED ORDER — VECURONIUM BROMIDE 10 MG IV SOLR
20.0000 mg | INTRAVENOUS | Status: DC | PRN
  Administered 2022-02-12 – 2022-02-16 (×18): 20 mg via INTRAVENOUS
  Filled 2022-02-11 (×18): qty 20

## 2022-02-11 MED ORDER — KETAMINE HCL 50 MG/5ML IJ SOSY: 1.0000 mg/kg | PREFILLED_SYRINGE | Freq: Once | INTRAMUSCULAR | Status: DC

## 2022-02-11 MED ORDER — KETAMINE HCL 50 MG/ML IJ SOLN
INTRAMUSCULAR | Status: AC | PRN
  Administered 2022-02-11: 50 mg via INTRAVENOUS

## 2022-02-11 MED ORDER — POLYETHYLENE GLYCOL 3350 17 G PO PACK: 17.0000 g | PACK | Freq: Every day | ORAL | Status: DC | PRN

## 2022-02-11 MED ORDER — SODIUM CHLORIDE 0.9 % IV SOLN
250.0000 mL | INTRAVENOUS | Status: DC | PRN
  Administered 2022-02-19: 250 mL via INTRAVENOUS

## 2022-02-11 MED ORDER — ONDANSETRON HCL 4 MG/2ML IJ SOLN: 4.0000 mg | Freq: Four times a day (QID) | INTRAMUSCULAR | Status: DC | PRN

## 2022-02-11 MED ORDER — PANTOPRAZOLE SODIUM 40 MG IV SOLR
40.0000 mg | Freq: Every day | INTRAVENOUS | Status: DC
  Administered 2022-02-11 – 2022-02-12 (×2): 40 mg via INTRAVENOUS
  Filled 2022-02-11 (×2): qty 10

## 2022-02-11 MED ORDER — PERFLUTREN LIPID MICROSPHERE
1.0000 mL | INTRAVENOUS | Status: AC | PRN
  Administered 2022-02-11: 2 mL via INTRAVENOUS

## 2022-02-11 MED ORDER — FUROSEMIDE 10 MG/ML IJ SOLN
60.0000 mg | Freq: Once | INTRAMUSCULAR | Status: AC
  Administered 2022-02-11: 60 mg via INTRAVENOUS
  Filled 2022-02-11: qty 8

## 2022-02-11 MED ORDER — DOCUSATE SODIUM 100 MG PO CAPS
100.0000 mg | ORAL_CAPSULE | Freq: Two times a day (BID) | ORAL | Status: DC
  Administered 2022-02-12 – 2022-02-13 (×2): 100 mg via ORAL
  Filled 2022-02-11 (×2): qty 1

## 2022-02-11 MED ORDER — ROCURONIUM BROMIDE 50 MG/5ML IV SOLN
100.0000 mg | Freq: Once | INTRAVENOUS | Status: AC
  Administered 2022-02-11: 100 mg via INTRAVENOUS
  Filled 2022-02-11: qty 10

## 2022-02-11 MED ORDER — SODIUM CHLORIDE 0.9 % IV SOLN
250.0000 mL | INTRAVENOUS | Status: DC
  Administered 2022-02-28 – 2022-03-03 (×3): 250 mL via INTRAVENOUS

## 2022-02-11 MED ORDER — NITROGLYCERIN 2 % TD OINT
1.0000 [in_us] | TOPICAL_OINTMENT | Freq: Once | TRANSDERMAL | Status: AC
  Administered 2022-02-11: 1 [in_us] via TOPICAL
  Filled 2022-02-11: qty 1

## 2022-02-11 MED ORDER — CYCLOBENZAPRINE HCL 10 MG PO TABS: 5.0000 mg | ORAL_TABLET | Freq: Two times a day (BID) | ORAL | Status: DC | PRN

## 2022-02-11 MED ORDER — MIDAZOLAM-SODIUM CHLORIDE 100-0.9 MG/100ML-% IV SOLN
2.0000 mg/h | INTRAVENOUS | Status: DC
  Administered 2022-02-11 – 2022-02-20 (×22): 10 mg/h via INTRAVENOUS
  Administered 2022-02-21: 6 mg/h via INTRAVENOUS
  Administered 2022-02-21: 8 mg/h via INTRAVENOUS
  Administered 2022-02-22 – 2022-02-23 (×2): 6 mg/h via INTRAVENOUS
  Filled 2022-02-11 (×27): qty 100

## 2022-02-11 MED ORDER — MIDAZOLAM HCL 2 MG/2ML IJ SOLN
INTRAMUSCULAR | Status: AC
  Administered 2022-02-11: 2 mg via INTRAVENOUS
  Filled 2022-02-11: qty 2

## 2022-02-11 MED ORDER — KETAMINE HCL 10 MG/ML IJ SOLN
INTRAMUSCULAR | Status: AC | PRN
  Administered 2022-02-11: 150 mg via INTRAVENOUS

## 2022-02-11 MED ORDER — HEPARIN SODIUM (PORCINE) 5000 UNIT/ML IJ SOLN: 5000.0000 [IU] | Freq: Three times a day (TID) | INTRAMUSCULAR | Status: DC

## 2022-02-11 MED ORDER — PROPOFOL 1000 MG/100ML IV EMUL
0.0000 ug/kg/min | INTRAVENOUS | Status: DC
  Administered 2022-02-11: 50 ug/kg/min via INTRAVENOUS
  Administered 2022-02-11 – 2022-02-12 (×3): 45 ug/kg/min via INTRAVENOUS
  Administered 2022-02-12: 50 ug/kg/min via INTRAVENOUS
  Administered 2022-02-12: 30 ug/kg/min via INTRAVENOUS
  Administered 2022-02-12: 50 ug/kg/min via INTRAVENOUS
  Administered 2022-02-12: 45 ug/kg/min via INTRAVENOUS
  Administered 2022-02-12: 20 ug/kg/min via INTRAVENOUS
  Filled 2022-02-11 (×10): qty 100

## 2022-02-11 MED ORDER — OXYCODONE-ACETAMINOPHEN 5-325 MG PO TABS: 1.0000 | ORAL_TABLET | Freq: Three times a day (TID) | ORAL | Status: DC | PRN

## 2022-02-11 MED ORDER — INSULIN ASPART 100 UNIT/ML IJ SOLN
0.0000 [IU] | INTRAMUSCULAR | Status: DC
  Administered 2022-02-11 – 2022-02-12 (×2): 15 [IU] via SUBCUTANEOUS
  Administered 2022-02-12: 20 [IU] via SUBCUTANEOUS
  Administered 2022-02-12: 11 [IU] via SUBCUTANEOUS
  Administered 2022-02-12: 7 [IU] via SUBCUTANEOUS
  Administered 2022-02-12: 11 [IU] via SUBCUTANEOUS
  Administered 2022-02-13: 7 [IU] via SUBCUTANEOUS
  Administered 2022-02-13 (×3): 11 [IU] via SUBCUTANEOUS
  Administered 2022-02-13: 7 [IU] via SUBCUTANEOUS
  Administered 2022-02-13 (×2): 11 [IU] via SUBCUTANEOUS
  Administered 2022-02-14 (×2): 15 [IU] via SUBCUTANEOUS
  Administered 2022-02-14: 11 [IU] via SUBCUTANEOUS
  Administered 2022-02-14 (×2): 15 [IU] via SUBCUTANEOUS
  Administered 2022-02-14: 11 [IU] via SUBCUTANEOUS
  Administered 2022-02-15: 7 [IU] via SUBCUTANEOUS
  Administered 2022-02-15: 4 [IU] via SUBCUTANEOUS
  Administered 2022-02-15 (×2): 7 [IU] via SUBCUTANEOUS
  Administered 2022-02-15: 11 [IU] via SUBCUTANEOUS
  Administered 2022-02-16 (×2): 3 [IU] via SUBCUTANEOUS
  Administered 2022-02-16: 6 [IU] via SUBCUTANEOUS
  Administered 2022-02-16: 4 [IU] via SUBCUTANEOUS
  Administered 2022-02-16 (×2): 3 [IU] via SUBCUTANEOUS
  Administered 2022-02-17 – 2022-02-18 (×8): 4 [IU] via SUBCUTANEOUS
  Administered 2022-02-18: 7 [IU] via SUBCUTANEOUS
  Administered 2022-02-18 – 2022-02-19 (×3): 4 [IU] via SUBCUTANEOUS
  Administered 2022-02-19 (×2): 7 [IU] via SUBCUTANEOUS
  Administered 2022-02-19: 4 [IU] via SUBCUTANEOUS
  Administered 2022-02-19 (×2): 11 [IU] via SUBCUTANEOUS
  Administered 2022-02-19: 15 [IU] via SUBCUTANEOUS
  Administered 2022-02-20: 7 [IU] via SUBCUTANEOUS
  Administered 2022-02-20: 4 [IU] via SUBCUTANEOUS
  Administered 2022-02-20: 11 [IU] via SUBCUTANEOUS
  Administered 2022-02-20: 4 [IU] via SUBCUTANEOUS
  Administered 2022-02-20 (×2): 11 [IU] via SUBCUTANEOUS
  Administered 2022-02-21 (×2): 7 [IU] via SUBCUTANEOUS
  Administered 2022-02-21: 3 [IU] via SUBCUTANEOUS
  Administered 2022-02-21: 7 [IU] via SUBCUTANEOUS
  Administered 2022-02-22 (×4): 4 [IU] via SUBCUTANEOUS
  Administered 2022-02-22: 3 [IU] via SUBCUTANEOUS
  Administered 2022-02-22 – 2022-02-23 (×2): 4 [IU] via SUBCUTANEOUS
  Administered 2022-02-23: 11 [IU] via SUBCUTANEOUS
  Administered 2022-02-23: 4 [IU] via SUBCUTANEOUS
  Administered 2022-02-23: 3 [IU] via SUBCUTANEOUS
  Administered 2022-02-23: 7 [IU] via SUBCUTANEOUS
  Administered 2022-02-24: 3 [IU] via SUBCUTANEOUS
  Administered 2022-02-24: 4 [IU] via SUBCUTANEOUS
  Administered 2022-02-24: 11 [IU] via SUBCUTANEOUS
  Administered 2022-02-24: 7 [IU] via SUBCUTANEOUS
  Administered 2022-02-24: 15 [IU] via SUBCUTANEOUS
  Filled 2022-02-11 (×75): qty 1

## 2022-02-11 MED ORDER — CHLORHEXIDINE GLUCONATE CLOTH 2 % EX PADS
6.0000 | MEDICATED_PAD | Freq: Every day | CUTANEOUS | Status: DC
  Administered 2022-02-11 – 2022-03-26 (×39): 6 via TOPICAL

## 2022-02-11 MED ORDER — NOREPINEPHRINE 4 MG/250ML-% IV SOLN
INTRAVENOUS | Status: AC
  Administered 2022-02-11: 4 ug/min via INTRAVENOUS
  Filled 2022-02-11: qty 250

## 2022-02-11 MED ORDER — IPRATROPIUM-ALBUTEROL 0.5-2.5 (3) MG/3ML IN SOLN
3.0000 mL | Freq: Once | RESPIRATORY_TRACT | Status: AC
  Administered 2022-02-11: 3 mL via RESPIRATORY_TRACT
  Filled 2022-02-11: qty 3

## 2022-02-11 MED ORDER — ENOXAPARIN SODIUM 80 MG/0.8ML IJ SOSY
0.5000 mg/kg | PREFILLED_SYRINGE | INTRAMUSCULAR | Status: DC
Start: 2022-02-11 — End: 2022-02-12
  Administered 2022-02-11: 75 mg via SUBCUTANEOUS
  Filled 2022-02-11: qty 0.75
  Filled 2022-02-11: qty 0.8

## 2022-02-11 MED ORDER — BUDESONIDE 0.5 MG/2ML IN SUSP
0.5000 mg | Freq: Two times a day (BID) | RESPIRATORY_TRACT | Status: DC
  Administered 2022-02-11 – 2022-02-22 (×22): 0.5 mg via RESPIRATORY_TRACT
  Filled 2022-02-11 (×22): qty 2

## 2022-02-11 MED ORDER — IPRATROPIUM-ALBUTEROL 0.5-2.5 (3) MG/3ML IN SOLN
RESPIRATORY_TRACT | Status: AC
  Filled 2022-02-11: qty 6

## 2022-02-11 MED ORDER — PANTOPRAZOLE 2 MG/ML SUSPENSION
40.0000 mg | Freq: Every day | ORAL | Status: DC
  Filled 2022-02-11: qty 20

## 2022-02-11 MED ORDER — LORAZEPAM 2 MG/ML IJ SOLN
INTRAMUSCULAR | Status: AC
  Administered 2022-02-11: 0.5 mg via INTRAVENOUS
  Filled 2022-02-11: qty 1

## 2022-02-11 MED ORDER — DOCUSATE SODIUM 100 MG PO CAPS
100.0000 mg | ORAL_CAPSULE | Freq: Two times a day (BID) | ORAL | Status: DC | PRN
  Administered 2022-03-23 – 2022-03-24 (×2): 100 mg via ORAL
  Filled 2022-02-11 (×3): qty 1

## 2022-02-11 MED ORDER — FUROSEMIDE 10 MG/ML IJ SOLN: 60.0000 mg | Freq: Once | INTRAMUSCULAR | Status: DC

## 2022-02-11 MED ORDER — NOREPINEPHRINE 4 MG/250ML-% IV SOLN
2.0000 ug/min | INTRAVENOUS | Status: DC
  Administered 2022-02-12: 4 ug/min via INTRAVENOUS
  Filled 2022-02-11: qty 250

## 2022-02-11 MED ORDER — ACETAMINOPHEN 325 MG PO TABS: 650.0000 mg | ORAL_TABLET | ORAL | Status: DC | PRN

## 2022-02-11 MED ORDER — METOPROLOL SUCCINATE ER 50 MG PO TB24: 25.0000 mg | ORAL_TABLET | Freq: Every day | ORAL | Status: DC

## 2022-02-11 MED ORDER — METHYLPREDNISOLONE SODIUM SUCC 125 MG IJ SOLR: 125.0000 mg | Freq: Once | INTRAMUSCULAR | Status: DC

## 2022-02-11 MED ORDER — LORAZEPAM 2 MG/ML IJ SOLN: 0.5000 mg | Freq: Once | INTRAMUSCULAR | Status: AC

## 2022-02-11 MED ORDER — ACETAMINOPHEN 325 MG PO TABS
650.0000 mg | ORAL_TABLET | Freq: Four times a day (QID) | ORAL | Status: DC | PRN
  Administered 2022-02-16 – 2022-04-02 (×31): 650 mg via ORAL
  Filled 2022-02-11 (×35): qty 2

## 2022-02-11 MED ORDER — MAGNESIUM HYDROXIDE 400 MG/5ML PO SUSP: 30.0000 mL | Freq: Every day | ORAL | Status: DC | PRN

## 2022-02-11 MED ORDER — POLYETHYLENE GLYCOL 3350 17 G PO PACK
17.0000 g | PACK | Freq: Every day | ORAL | Status: DC
  Administered 2022-02-12 – 2022-02-14 (×3): 17 g via ORAL
  Filled 2022-02-11 (×3): qty 1

## 2022-02-11 MED ORDER — STERILE WATER FOR INJECTION IJ SOLN
INTRAMUSCULAR | Status: AC
  Administered 2022-02-11: 10 mL
  Filled 2022-02-11: qty 20

## 2022-02-11 MED ORDER — FENTANYL BOLUS VIA INFUSION
50.0000 ug | INTRAVENOUS | Status: DC | PRN
  Administered 2022-02-11: 75 ug via INTRAVENOUS
  Administered 2022-02-12 – 2022-02-15 (×6): 100 ug via INTRAVENOUS

## 2022-02-11 MED ORDER — FUROSEMIDE 10 MG/ML IJ SOLN
40.0000 mg | Freq: Two times a day (BID) | INTRAMUSCULAR | Status: DC
  Administered 2022-02-12 – 2022-02-13 (×3): 40 mg via INTRAVENOUS
  Filled 2022-02-11 (×6): qty 4

## 2022-02-11 MED ORDER — ORAL CARE MOUTH RINSE: 15.0000 mL | OROMUCOSAL | Status: DC | PRN

## 2022-02-11 MED ORDER — FENTANYL 2500MCG IN NS 250ML (10MCG/ML) PREMIX INFUSION
50.0000 ug/h | INTRAVENOUS | Status: DC
  Administered 2022-02-11: 50 ug/h via INTRAVENOUS
  Administered 2022-02-12 – 2022-02-15 (×8): 200 ug/h via INTRAVENOUS
  Administered 2022-02-16: 275 ug/h via INTRAVENOUS
  Administered 2022-02-16 – 2022-02-20 (×12): 300 ug/h via INTRAVENOUS
  Administered 2022-02-21 – 2022-02-25 (×9): 200 ug/h via INTRAVENOUS
  Administered 2022-02-26: 175 ug/h via INTRAVENOUS
  Administered 2022-02-27: 150 ug/h via INTRAVENOUS
  Filled 2022-02-11 (×34): qty 250

## 2022-02-11 MED ORDER — ONDANSETRON HCL 4 MG PO TABS
4.0000 mg | ORAL_TABLET | Freq: Four times a day (QID) | ORAL | Status: DC | PRN
Start: 2022-02-11 — End: 2022-03-01

## 2022-02-11 MED ORDER — SUCCINYLCHOLINE CHLORIDE 200 MG/10ML IV SOSY
1.5000 mg/kg | PREFILLED_SYRINGE | Freq: Once | INTRAVENOUS | Status: AC
  Administered 2022-02-11: 224.6 mg via INTRAVENOUS
  Filled 2022-02-11: qty 20

## 2022-02-11 MED ORDER — PROPOFOL 1000 MG/100ML IV EMUL
INTRAVENOUS | Status: AC
  Administered 2022-02-11: 50 ug/kg/min via INTRAVENOUS
  Filled 2022-02-11: qty 100

## 2022-02-11 MED ORDER — FENTANYL CITRATE PF 50 MCG/ML IJ SOSY: 50.0000 ug | PREFILLED_SYRINGE | Freq: Once | INTRAMUSCULAR | Status: DC

## 2022-02-11 MED ORDER — SODIUM CHLORIDE 0.9% FLUSH
3.0000 mL | Freq: Two times a day (BID) | INTRAVENOUS | Status: DC
  Administered 2022-02-11 – 2022-02-25 (×27): 3 mL via INTRAVENOUS

## 2022-02-11 MED ORDER — ORAL CARE MOUTH RINSE
15.0000 mL | OROMUCOSAL | Status: DC
  Administered 2022-02-11 – 2022-03-07 (×282): 15 mL via OROMUCOSAL

## 2022-02-11 MED ORDER — MIDAZOLAM HCL 2 MG/2ML IJ SOLN: 2.0000 mg | INTRAMUSCULAR | Status: AC

## 2022-02-11 MED ORDER — FENTANYL 2500MCG IN NS 250ML (10MCG/ML) PREMIX INFUSION
INTRAVENOUS | Status: AC
  Filled 2022-02-11: qty 250

## 2022-02-11 MED ORDER — ACETAMINOPHEN 650 MG RE SUPP: 650.0000 mg | Freq: Four times a day (QID) | RECTAL | Status: DC | PRN

## 2022-02-11 MED ORDER — SODIUM CHLORIDE 0.9% FLUSH: 3.0000 mL | INTRAVENOUS | Status: DC | PRN

## 2022-02-11 NOTE — ED Provider Notes (Addendum)
[facility] Provider Note    Event Date/Time   First MD Initiated Contact with Patient 02/11/22 1310     (approximate)   History   Leg Swelling   HPI  Samuel Chavez is a 46 y.o. male with a history of COPD as well as CHF as well as sleep apnea presents to the ER for evaluation of worsening leg swelling and shortness of breath.  Patient states he been taking his torsemide without improvement and feels like he is greater than 30 pounds up from his dry weight.  Is having worsening orthopnea does not wear home oxygen.  Was supposed to follow-up for evaluation of possible sleep apnea.  Denies any pain.     Physical Exam   Triage Vital Signs: ED Triage Vitals  Enc Vitals Group     BP 02/11/22 1019 (!) 187/102     Pulse Rate 02/11/22 1019 99     Resp 02/11/22 1019 20     Temp 02/11/22 1019 99.1 F (37.3 C)     Temp Source 02/11/22 1019 Oral     SpO2 02/11/22 1019 (!) 89 %     Weight 02/11/22 1027 (!) 330 lb (149.7 kg)     Height 02/11/22 1027 5\' 9"  (1.753 m)     Head Circumference --      Peak Flow --      Pain Score 02/11/22 1026 10     Pain Loc --      Pain Edu? --      Excl. in [place]? --     Most recent vital signs: Vitals:   02/11/22 1700 02/11/22 1715  BP: (!) 84/62 129/61  Pulse: (!) 102 (!) 118  Resp: 20 15  Temp:    SpO2: (!) 78% (!) 71%     Constitutional: Alert moderate respiratory distress Eyes: Conjunctivae are normal.  Head: Atraumatic. Nose: No congestion/rhinnorhea. Mouth/Throat: Mucous membranes are moist.   Neck: Painless ROM.  Cardiovascular:   Good peripheral circulation. Respiratory: Tachypnea with diminished breath sounds throughout. Gastrointestinal: Soft and nontender.  Musculoskeletal:  no deformity, 3+ bilateral pitting edema. Neurologic:  MAE spontaneously. No gross focal neurologic deficits are appreciated.  Skin:  Skin is warm, dry and intact. No rash noted. Psychiatric: Mood and affect are normal. Speech  and behavior are normal.    ED Results / Procedures / Treatments   Labs (all labs ordered are listed, but only abnormal results are displayed) Labs Reviewed  CBC - Abnormal; Notable for the following components:      Result Value   MCHC 29.9 (*)    All other components within normal limits  BASIC METABOLIC PANEL - Abnormal; Notable for the following components:   Chloride 94 (*)    CO2 34 (*)    Glucose, Bld 319 (*)    Calcium 8.8 (*)    All other components within normal limits  BLOOD GAS, VENOUS - Abnormal; Notable for the following components:   pCO2, Ven 87 (*)    Bicarbonate 40.0 (*)    Acid-Base Excess 9.4 (*)    All other components within normal limits  CBG MONITORING, ED - Abnormal; Notable for the following components:   Glucose-Capillary 359 (*)    All other components within normal limits  SARS CORONAVIRUS 2 BY RT PCR  BRAIN NATRIURETIC PEPTIDE  HIV ANTIBODY (ROUTINE TESTING W REFLEX)  CBC  CREATININE, SERUM  BASIC METABOLIC PANEL  CBC  TRIGLYCERIDES     EKG ED  ECG REPORT I, Merlyn Lot, the attending physician, personally viewed and interpreted this ECG.   Date: 02/11/2022  EKG Time: 15:53  Rate: 100  Rhythm: sinus  Axis: left  Intervals: normal  ST&T Change: suspect limb lead reversal, no stemi, or depression   RADIOLOGY Please see ED Course for my review and interpretation.  I personally reviewed all radiographic images ordered to evaluate for the above acute complaints and reviewed radiology reports and findings.  These findings were personally discussed with the patient.  Please see medical record for radiology report.    PROCEDURES:  Critical Care performed: Yes, see critical care procedure note(s)  .Critical Care  Performed by: Merlyn Lot, MD Authorized by: Merlyn Lot, MD   Critical care provider statement:    Critical care time (minutes):  45   Critical care was necessary to treat or prevent imminent or  life-threatening deterioration of the following conditions:  Respiratory failure   Critical care was time spent personally by me on the following activities:  Ordering and performing treatments and interventions, ordering and review of laboratory studies, ordering and review of radiographic studies, pulse oximetry, re-evaluation of patient's condition, review of old charts, obtaining history from patient or surrogate, examination of patient, evaluation of patient's response to treatment, discussions with primary provider, discussions with consultants and development of treatment plan with patient or surrogate Procedure Name: Intubation Date/Time: 02/11/2022 4:31 PM  Performed by: Merlyn Lot, MDPre-anesthesia Checklist: Patient identified, Emergency Drugs available, Suction available and Patient being monitored Preoxygenation: Pre-oxygenation with 100% oxygen Induction Type: IV induction and Rapid sequence Laryngoscope Size: Glidescope and 4 Grade View: Grade II Tube size: 7.0 mm Number of attempts: 1 Airway Equipment and Method: Video-laryngoscopy Placement Confirmation: ETT inserted through vocal cords under direct vision, CO2 detector and Breath sounds checked- equal and bilateral Secured at: 23 cm Tube secured with: ETT holder Dental Injury: Teeth and Oropharynx as per pre-operative assessment        MEDICATIONS ORDERED IN ED: Medications  methylPREDNISolone sodium succinate (SOLU-MEDROL) 125 mg/2 mL injection 125 mg (has no administration in time range)  oxyCODONE-acetaminophen (PERCOCET/ROXICET) 5-325 MG per tablet 1 tablet (has no administration in time range)  metoprolol succinate (TOPROL-XL) 24 hr tablet 25 mg (has no administration in time range)  cyclobenzaprine (FLEXERIL) tablet 5-10 mg (has no administration in time range)  enoxaparin (LOVENOX) injection 75 mg (has no administration in time range)  acetaminophen (TYLENOL) tablet 650 mg (has no administration in time  range)    Or  acetaminophen (TYLENOL) suppository 650 mg (has no administration in time range)  traZODone (DESYREL) tablet 25 mg (has no administration in time range)  magnesium hydroxide (MILK OF MAGNESIA) suspension 30 mL (has no administration in time range)  ondansetron (ZOFRAN) tablet 4 mg (has no administration in time range)    Or  ondansetron (ZOFRAN) injection 4 mg (has no administration in time range)  furosemide (LASIX) injection 40 mg (has no administration in time range)  ketamine (KETALAR) injection (150 mg Intravenous Given 02/11/22 1616)  fentaNYL 10 mcg/ml infusion (has no administration in time range)  sodium chloride flush (NS) 0.9 % injection 3 mL (has no administration in time range)  sodium chloride flush (NS) 0.9 % injection 3 mL (has no administration in time range)  0.9 %  sodium chloride infusion (has no administration in time range)  docusate sodium (COLACE) capsule 100 mg (has no administration in time range)  polyethylene glycol (MIRALAX / GLYCOLAX) packet 17 g (has no administration  in time range)  pantoprazole (PROTONIX) injection 40 mg (has no administration in time range)  docusate sodium (COLACE) capsule 100 mg (has no administration in time range)  polyethylene glycol (MIRALAX / GLYCOLAX) packet 17 g (has no administration in time range)  fentaNYL (SUBLIMAZE) injection 50 mcg (50 mcg Intravenous Not Given 02/11/22 1657)  fentaNYL 2559mcg in NS 221mL (12mcg/ml) infusion-PREMIX (50 mcg/hr Intravenous New Bag/Given 02/11/22 1654)  fentaNYL (SUBLIMAZE) bolus via infusion 50-100 mcg (has no administration in time range)  propofol (DIPRIVAN) 1000 MG/100ML infusion (50 mcg/kg/min  149.7 kg Intravenous New Bag/Given 02/11/22 1715)  ipratropium-albuterol (DUONEB) 0.5-2.5 (3) MG/3ML nebulizer solution (has no administration in time range)  ketamine (KETALAR) injection (50 mg Intravenous Given 02/11/22 1705)  rocuronium bromide 100 MG/10ML SOSY (has no administration in  time range)  furosemide (LASIX) injection 60 mg (60 mg Intravenous Given 02/11/22 1417)  ipratropium-albuterol (DUONEB) 0.5-2.5 (3) MG/3ML nebulizer solution 3 mL (3 mLs Nebulization Given 02/11/22 1445)  ipratropium-albuterol (DUONEB) 0.5-2.5 (3) MG/3ML nebulizer solution 3 mL (3 mLs Nebulization Given 02/11/22 1445)  nitroGLYCERIN (NITROGLYN) 2 % ointment 1 inch (1 inch Topical Given 02/11/22 1443)  LORazepam (ATIVAN) injection 0.5 mg (0.5 mg Intravenous Given 02/11/22 1443)  succinylcholine (ANECTINE) syringe 224.6 mg (224.6 mg Intravenous Given 02/11/22 1631)  rocuronium (ZEMURON) injection 100 mg (100 mg Intravenous Given 02/11/22 1714)     IMPRESSION / MDM / ASSESSMENT AND PLAN / ED COURSE  I reviewed the triage vital signs and the nursing notes.                              Differential diagnosis includes, but is not limited to, Asthma, copd, CHF, pna, ptx, malignancy, Pe, anemia  Patient presented to the ER for evaluation of symptoms as described above.  This presenting complaint could reflect a potentially life-threatening illness therefore the patient will be placed on continuous pulse oximetry and telemetry for monitoring.  Laboratory evaluation will be sent to evaluate for the above complaints.  Clinically patient does appear volume overloaded will order IV Lasix also with some wheezing on exam and his bicarb is elevated therefore I am concerned for component of hypercapnia as he does seem somewhat drowsy but he is protecting his airway and is answering appropriately.  Does have evidence of acute respiratory failure with hypoxia.  Chest x-ray on my review and interpretation does not show any evidence of pneumothorax but does show evidence of probable acute on chronic congestive heart failure.  Clinical Course as of 02/11/22 1718  Tue Feb 11, 2022  1449 Patient with evidence of acute respiratory failure with hypoxia and hypercapnia.  Patient will be placed on BiPAP. Patient states he gets  very anxious on bipap therefor will order low dose anxiolytic. Patient given Lasix for diuresis given volume overload will give Solu-Medrol as well as additional nebulizer treatments.  Patient will require hospitalization. [PR]  1525 Patient's clinical status is improving on BiPAP.  We will continue observe [PR]  1608 Patient had improved on bipap but hospitalist came to evaluate patient and patient difficult to awaken.  Snoring respirations.  Concern for worsening hypercapnic resp failure.  Will intube for airway protection. [PR]  1709 Patient was a difficult intubation.  Difficult to maintain sats above 80 even with bagging.  Suspect worsening edema by XR.  Tube in appropriate position.   [PR]  1717 Patient given roc paralytic with improvement in oxygenation to the 80's.  Patient  critically ill but stable for admission to ICU. [PR]    Clinical Course User Index [PR] Merlyn Lot, MD   Patient's respiratory status stabilized and improved on ventilator.  He is on propofol and fentanyl for sedation.  Case discussed in consultation with Dr. Mortimer Fries of ICU who will admit patient   FINAL CLINICAL IMPRESSION(S) / ED DIAGNOSES   Final diagnoses:  Acute respiratory failure with hypoxia and hypercapnia ([place])  Peripheral edema     Rx / DC Orders   ED Discharge Orders     None        Note:  This document was prepared using Dragon voice recognition software and may include unintentional dictation errors.      Merlyn Lot, MD 02/11/22 [phone]

## 2022-02-11 NOTE — Assessment & Plan Note (Signed)
-   I ordered a 2D echo that was performed.  The report is currently pending. - We will continue diuresis with IV Lasix. - He will need cardiology consultation.  Will defer to the ICU team. - Serial troponins to be followed.

## 2022-02-11 NOTE — Progress Notes (Signed)
Pt was transported to CCU from the ED while on the vent.

## 2022-02-11 NOTE — Progress Notes (Signed)
PHARMACIST - PHYSICIAN COMMUNICATION  CONCERNING:  Enoxaparin (Lovenox) for DVT Prophylaxis    RECOMMENDATION: Patient was prescribed enoxaprin 40mg  q24 hours for VTE prophylaxis.   Filed Weights   02/11/22 1027  Weight: (!) 149.7 kg (330 lb)    Body mass index is 48.73 kg/m.  Estimated Creatinine Clearance: 142.1 mL/min (by C-G formula based on SCr of 0.96 mg/dL).   Based on [place] patient is candidate for enoxaparin 0.5mg /kg TBW SQ every 24 hours based on BMI being >30.  DESCRIPTION: Pharmacy has adjusted enoxaparin dose per [facility] policy.  Patient is now receiving enoxaparin 0.5 mg/kg every 24 hours    Delena Bali, PharmD PGY-1 Pharmacy Resident 02/11/2022 3:43 PM

## 2022-02-11 NOTE — Assessment & Plan Note (Signed)
-   He will be placed on supplement coverage with NovoLog. - His metformin will be held off. - His Neurontin can be continued.

## 2022-02-11 NOTE — Progress Notes (Signed)
An USGPIV (ultrasound guided PIV) has been placed for short-term vasopressor infusion. A correctly placed ivWatch must be used when administering Vasopressors. Should this treatment be needed beyond 72 hours, central line access should be obtained.  It will be the responsibility of the bedside nurse to follow best practice to prevent extravasations.   ?

## 2022-02-11 NOTE — Assessment & Plan Note (Signed)
-   The patient will be admitted to an ICU bed. - She was sedated and intubated in the ER. - ICU team was notified about the patient. - This is clearly secondary to acute on chronic systolic CHF and obesity hypoventilation syndrome with likely untreated obstructive sleep apnea.

## 2022-02-11 NOTE — Consult Note (Signed)
NAME:  Samuel Chavez, MRN:  [id number], DOB:  06-22-1976, LOS: 0 ADMISSION DATE:  02/11/2022, CONSULTATION DATE: 02/11/22 REFERRING MD:  Eugenie Norrie MD  CHIEF COMPLAINT: SOB    HPI  46 y.o male with significant PMH as below who presented to the ED with chief complaints of SOB and worsening bilateral lower extremities edema.  ED Course: In the emergency department, the temperature was 37.3C, the heart rate 99 beats/minute, the blood pressure 187/102 mm Hg, the respiratory rate 20 breaths/minute, and the oxygen saturation 89% on. He was noted to be drowsy but still protecting his airway and responding appropriately.  Pertinent Labs/Diagnostics Findings: Chemistry:Glucose:319 Calcium: 8.8 otherwise unremarkable CBC: unremarkable Other Lab findings:  COVID PCR: Negative, BNP: pending, Venous Blood Gas result:  pO2 pending; pCO2 87; pH 7.27;  HCO3 40, %O2 Sat 48.7.  Imaging: Chest x-ray showed cardiomegaly and pulmonary vascular congestion without overt pulmonary edema  Patient was placed on BiPAP for acute on chronic hypoxic hypercapnic respiratory failure.  He was treated with IV Lasix, Solu-Medrol, DuoNebs and admitted to hospitalist service.  Patient was initially improving on BiPAP however on reassessment he was noted to be minimally responsive with snoring respirations.  Due to concerns for worsening hypercapnia and impending respiratory failure he was intubated for airway protection.  Past Medical History  Hypertension  OSA (obstructive sleep apnea)  CHF (congestive heart failure), NYHA class III, acute on chronic, diastolic (HCC) LVEF about 35% Bell's palsy of the Left face Type 2 diabetes mellitus, without long-term current use of insulin  Morbid obesity with BMI of 50.0-59.9  Diabetic neuropathy  Noncompliance with medications Pulmonary hypertension COPD Tobacco abuse currently 1 pack/day x 22 years  Significant Hospital Events   7/25: Admitted to hospitalist service w/acute  on chronic hypoxic hypercapnic resp. failure requiring BiPAP.Failed BiPAP and intubated. PCCM consulted  Consults:  PCCM  Procedures:  7/25: Intubation  Significant Diagnostic Tests:  7/25: Chest Xray> Chest x-ray showed cardiomegaly and pulmonary vascular congestion without overt pulmonary edema  Micro Data:  7/25: SARS-CoV-2 PCR> negative 7/25: MRSA PCR>>   Antimicrobials:  None  OBJECTIVE  Blood pressure 107/64, pulse 99, temperature 98.1 F (36.7 C), temperature source Bladder, resp. rate 19, height 5\' 9"  (1.753 m), weight (!) 170.9 kg, SpO2 93 %.    Vent Mode: AC FiO2 (%):  [100 %] 100 % Set Rate:  [20 bmp] 20 bmp Vt Set:  [525 mL] 525 mL PEEP:  [10 cmH20] 10 cmH20   Intake/Output Summary (Last 24 hours) at 02/11/2022 1945 Last data filed at 02/11/2022 1740 Gross per 24 hour  Intake 22.54 ml  Output 500 ml  Net -477.46 ml   Filed Weights   02/11/22 1027 02/11/22 1740  Weight: (!) 149.7 kg (!) 170.9 kg   Physical Examination  GENERAL: year-old critically ill patient lying in the bed with no acute distress.  EYES: Pupils equal, round, reactive to light and accommodation. No scleral icterus. Extraocular muscles intact.  HEENT: Head atraumatic, normocephalic. Oropharynx and nasopharynx clear.  NECK:  Supple, no jugular venous distention. No thyroid enlargement, no tenderness.  LUNGS: Normal breath sounds bilaterally, no wheezing, rales,rhonchi or crepitation. No use of accessory muscles of respiration.  CARDIOVASCULAR: S1, S2 normal. No murmurs, rubs, or gallops.  ABDOMEN: Soft, nontender, nondistended. Bowel sounds present. No organomegaly or mass.  EXTREMITIES: No pedal edema, cyanosis, or clubbing.  NEUROLOGIC: Cranial nerves II through XII are intact.  Muscle strength 5/5 in all extremities. Sensation intact. Gait  not checked.  PSYCHIATRIC: The patient is alert and oriented x 3.  SKIN: No obvious rash, lesion, or ulcer.   Labs/imaging that I havepersonally  reviewed  (right click and "Reselect all SmartList Selections" daily)      Labs   CBC: Recent Labs  Lab 02/11/22 1029  WBC 7.7  HGB 13.2  HCT 44.1  MCV 89.3  PLT 568    Basic Metabolic Panel: Recent Labs  Lab 02/11/22 1029  NA 135  K 4.1  CL 94*  CO2 34*  GLUCOSE 319*  BUN 11  CREATININE 0.96  CALCIUM 8.8*   GFR: Estimated Creatinine Clearance: 153.9 mL/min (by C-G formula based on SCr of 0.96 mg/dL). Recent Labs  Lab 02/11/22 1029  WBC 7.7    Liver Function Tests: No results for input(s): "AST", "ALT", "ALKPHOS", "BILITOT", "PROT", "ALBUMIN" in the last 168 hours. No results for input(s): "LIPASE", "AMYLASE" in the last 168 hours. No results for input(s): "AMMONIA" in the last 168 hours.  ABG    Component Value Date/Time   PHART 7.28 (L) 02/11/2022 1836   PCO2ART 78 (HH) 02/11/2022 1836   PO2ART 71 (L) 02/11/2022 1836   HCO3 36.7 (H) 02/11/2022 1836   TCO2 26 03/17/2007 1822   O2SAT 94.6 02/11/2022 1836     Coagulation Profile: No results for input(s): "INR", "PROTIME" in the last 168 hours.  Cardiac Enzymes: No results for input(s): "CKTOTAL", "CKMB", "CKMBINDEX", "TROPONINI" in the last 168 hours.  HbA1C: No results found for: "HGBA1C"  CBG: Recent Labs  Lab 02/11/22 1033 02/11/22 1755  GLUCAP 359* 308*    Review of Systems:   UNABLE TO OBTAIN, PATIENT IS INTUBATED AND SEDATED  Past Medical History  He,  has a past medical history of Diabetes mellitus without complication ([place]), Hypertension, and Pancreatitis.   Surgical History    Past Surgical History:  Procedure Laterality Date   CHOLECYSTECTOMY       Social History   reports that he has been smoking cigarettes. He has been smoking an average of 1 pack per day. He has never used smokeless tobacco. He reports that he does not drink alcohol and does not use drugs.   Family History   His family history is not on file.   Allergies Allergies  Allergen Reactions    Hydrocodone Swelling     Home Medications  Prior to Admission medications   Medication Sig Start Date End Date Taking? Authorizing Provider  losartan (COZAAR) 100 MG tablet Take 100 mg by mouth daily. 02/01/22  Yes [provider]  OZEMPIC, 0.25 OR 0.5 MG/DOSE, 2 MG/3ML SOPN SMARTSIG:0.25 Milligram(s) SUB-Q Once a Week 12/09/21  Yes [provider]  torsemide (DEMADEX) 20 MG tablet Take 20 mg by mouth daily. 12/18/21  Yes [provider]  Aspirin-Salicylamide-Caffeine (BC HEADACHE POWDER PO) Take 1 packet by mouth as needed (for pain).    [provider]  cephALEXin (KEFLEX) 500 MG capsule Take 1 capsule (500 mg total) by mouth 4 (four) times daily. Patient not taking: Reported on 02/11/2022 12/16/18   Davonna Belling, MD  cyclobenzaprine (FLEXERIL) 10 MG tablet Take 0.5-1 tablets (5-10 mg total) by mouth 2 (two) times daily as needed for muscle spasms. Patient not taking: Reported on 02/11/2022 03/12/18   Margarita Mail, PA-C  gabapentin (NEURONTIN) 600 MG tablet Take 600 mg by mouth at bedtime. Patient not taking: Reported on 02/11/2022 10/13/21   [provider]  LABETALOL HCL PO Take by mouth. Patient not taking: Reported  on 02/11/2022    [provider]  meloxicam (MOBIC) 15 MG tablet Take 1 tablet (15 mg total) by mouth daily. Take 1 daily with food. Patient not taking: Reported on 02/11/2022 03/12/18   Margarita Mail, PA-C  metFORMIN (GLUCOPHAGE) 500 MG tablet Take 500 mg by mouth 2 (two) times daily with a meal. Patient not taking: Reported on 02/11/2022    [provider]  METFORMIN HCL ER, MOD, PO Take by mouth. Patient not taking: Reported on 02/11/2022    [provider]  METOPROLOL SUCCINATE ER PO Take by mouth. Patient not taking: Reported on 02/11/2022    [provider]  oxyCODONE-acetaminophen (PERCOCET) 10-325 MG tablet Take 1 tablet by mouth See admin instructions. Take 1 tablet by mouth 4-5 times a day  as needed for pain Patient not taking: Reported on 02/11/2022    [provider]  oxyCODONE-acetaminophen (PERCOCET/ROXICET) 5-325 MG tablet Take 1 tablet by mouth every 8 (eight) hours as needed for severe pain. Patient not taking: Reported on 02/11/2022 12/16/18   Davonna Belling, MD  predniSONE (DELTASONE) 20 MG tablet Take 2 tablets (40 mg total) by mouth daily. Patient not taking: Reported on 02/11/2022 04/04/15   Davonna Belling, MD   Scheduled Meds:  budesonide (PULMICORT) nebulizer solution  0.5 mg Nebulization BID   [START ON 02/12/2022] Chlorhexidine Gluconate Cloth  6 each Topical Q0600   docusate sodium  100 mg Oral BID   enoxaparin (LOVENOX) injection  0.5 mg/kg Subcutaneous Q24H   fentaNYL (SUBLIMAZE) injection  50 mcg Intravenous Once   [START ON 02/12/2022] furosemide  40 mg Intravenous Q12H   ipratropium-albuterol  3 mL Nebulization Q4H   ipratropium-albuterol       methylPREDNISolone (SOLU-MEDROL) injection  40 mg Intravenous Q12H   metoprolol succinate  25 mg Oral Daily   mouth rinse  15 mL Mouth Rinse Q2H   pantoprazole (PROTONIX) IV  40 mg Intravenous QHS   polyethylene glycol  17 g Oral Daily   rocuronium bromide       sodium chloride flush  3 mL Intravenous Q12H   Continuous Infusions:  sodium chloride     sodium chloride     fentaNYL     fentaNYL infusion INTRAVENOUS 50 mcg/hr (02/11/22 1740)   midazolam 10 mg/hr (02/11/22 1924)   norepinephrine     norepinephrine (LEVOPHED) Adult infusion     propofol (DIPRIVAN) infusion 50 mcg/kg/min (02/11/22 1926)   PRN Meds:.sodium chloride, acetaminophen **OR** acetaminophen, cyclobenzaprine, docusate sodium, fentaNYL, fentaNYL, ipratropium-albuterol, magnesium hydroxide, norepinephrine, ondansetron **OR** ondansetron (ZOFRAN) IV, mouth rinse, oxyCODONE-acetaminophen, polyethylene glycol, rocuronium bromide, sodium chloride flush, traZODone, vecuronium  Active Hospital Problem list     Assessment & Plan:    Acute Hypoxic Hypercapnic Respiratory Failure secondary to  -Wean PEEP and FiO2 for sats greater than 90% -Plateau pressures less than 30 cm H20 -Vecuronium PRN for significant vent dyssynchrony -VAP bundle in place -Intermittent chest x-ray & ABG -wean sedation/analgesia for RASS goal 0 -As needed bronchodilators -Encourage smoking cessation  Acute on Chronic Diastolic CHF Hypertensive Urgency PMHx:  Continuous cardiac monitoring -Maintain MAP greater than 65 -IV Lasix as blood pressure and renal function permits; currently on Lasix 40 mg IV BID -Continue Norvasc, metoprolol. -Cardiology following, appreciate input -Repeat 2D Echocardiogram   Diabetes mellitus HgbA1c  -CBG's AC & hs; Target range of 140 to 180 -SSI -Follow ICU Hypo/Hyperglycemia protocol   Best practice:  Diet:  NPO Pain/Anxiety/Delirium protocol (if indicated): Yes (RASS goal 0) VAP protocol (if  indicated): Yes DVT prophylaxis: LMWH GI prophylaxis: PPI Glucose control:  SSI Yes Central venous access:  N/A Arterial line:  N/A Foley:  Yes, and it is still needed Mobility:  bed rest  PT consulted: N/A Last date of multidisciplinary goals of care discussion [7/25] Code Status:  full code Disposition: ICU   = Goals of Care = Code Status Order: FULL  Primary Emergency Contact: Woods,[name] updated daughter at the bedside Wishes to pursue full aggressive treatment and intervention options, including CPR and intubation, but goals of care will be addressed on going with family if that should become necessary.  Critical care time: 45 minutes     Rufina Falco, DNP, CCRN, FNP-C, AGACNP-BC Acute Care Nurse Practitioner  [facility] Pager: [phone] [place] at [facility]

## 2022-02-11 NOTE — ED Triage Notes (Addendum)
Pt here with bilateral leg swelling x1 month. Pt has been taking his fluid pills and been urinating a lot but the swelling has not went down. Pt also having back pain. Pt also hypoxic in triage at 88% on RA, pt placed on 2L BNC with sats increasing to 96%.

## 2022-02-11 NOTE — Assessment & Plan Note (Signed)
-   This could be contributing to his acute CHF. - He will be placed on as needed IV labetalol and his antihypertensives can be placed through NG/OG tube.

## 2022-02-11 NOTE — ED Notes (Signed)
MD at the bedside for pt evaluation. Provided pt with a warm blanket. Pt requesting beverage but informed he would have to wait until the MD cleared him to have a drink.

## 2022-02-11 NOTE — H&P (Signed)
PATIENT NAME: Samuel Chavez    MR#:  [id number]  DATE OF BIRTH:  1976-03-18  DATE OF ADMISSION:  02/11/2022  PRIMARY CARE PHYSICIAN: [facility] [place]   Patient is coming from: Home   REQUESTING/REFERRING PHYSICIAN: Merlyn Lot, MD  CHIEF COMPLAINT:   Chief Complaint  Patient presents with   Leg Swelling    HISTORY OF PRESENT ILLNESS:  Samuel Chavez is a 46 y.o. morbid obese African-American male with medical history significant for type 2 diabetes mellitus, hypertension, pancreatitis, ongoing tobacco abuse, systolic CHF, OSA, type 2 diabetes mellitus and diabetic neuropathy, lumbar radiculopathy, and morbid obesity, who presented to the ER with acute onset of worsening lower extremity edema with associated dyspnea as well as orthopnea.  He has been taking his torsemide without improvement.  He feels he gained over 30 pounds above his dry weight.  He is not on home O2.  He was expected to follow-up for evaluation of his obstructive sleep apnea.  During my interview the patient with fairly lethargic and was not responding to commands.  He was on BiPAP that was started in the ER especially after significant hypercarbia on VBG.  Therefore no further history could be obtained from the patient. ED Course: When he came to the ER BP was 158/141 with a heart rate of 91 and pulse symmetry was 91 and later 82% when he was placed on BiPAP at 60% FiO2 and it did improve to 98-100%.  Labs revealed VBG with PCO2 of 87 and HCO3 of 40.  CMP was remarkable for blood glucose of 319 and CO2 34 with chloride of 94 and calcium 8.8.  CBC was unremarkable. EKG as reviewed by me : EKG showed sinus rhythm with a rate of 98 with Q waves anteroseptally. Imaging: Two-view chest x-ray showed cardiomegaly and pulmonary vascular congestion without overt pulmonary edema.  The patient was given DuoNebs twice, 0.5 mg of IV Ativan, 1 inch of Nitropaste, and 60 mg of IV Lasix.  He  remains somnolent and given significant hypercarbia decision was made to intubate him and placed on mechanical ventilation.  ICU team was notified.  He will be admitted to an ICU bed for further evaluation and management. PAST MEDICAL HISTORY:   Past Medical History:  Diagnosis Date   Diabetes mellitus without complication ([place])    Hypertension    Pancreatitis     PAST SURGICAL HISTORY:   Past Surgical History:  Procedure Laterality Date   CHOLECYSTECTOMY      SOCIAL HISTORY:   Social History   Tobacco Use   Smoking status: Every Day    Packs/day: 1.00    Types: Cigarettes   Smokeless tobacco: Never  Substance Use Topics   Alcohol use: No    FAMILY HISTORY:  No family history on file.  Unobtainable due to admissions altered mental status.  DRUG ALLERGIES:   Allergies  Allergen Reactions   Hydrocodone Swelling    REVIEW OF SYSTEMS:   ROS As per history of present illness. All pertinent systems were reviewed above. Constitutional, HEENT, cardiovascular, respiratory, GI, GU, musculoskeletal, neuro, psychiatric, endocrine, integumentary and hematologic systems were reviewed and are otherwise negative/unremarkable except for positive findings mentioned above in the HPI.   MEDICATIONS AT HOME:   Prior to Admission medications   Medication Sig Start Date End Date Taking? Authorizing Provider  Aspirin-Salicylamide-Caffeine (BC HEADACHE POWDER PO) Take 1 packet by mouth as needed (for pain).    [provider]  cephALEXin (KEFLEX) 500 MG capsule Take 1 capsule (500 mg total) by mouth 4 (four) times daily. 12/16/18   Davonna Belling, MD  cyclobenzaprine (FLEXERIL) 10 MG tablet Take 0.5-1 tablets (5-10 mg total) by mouth 2 (two) times daily as needed for muscle spasms. 03/12/18   Margarita Mail, PA-C  LABETALOL HCL PO Take by mouth.    [provider]  meloxicam (MOBIC) 15 MG tablet Take 1 tablet (15 mg total) by mouth daily. Take 1 daily with food.  03/12/18   Margarita Mail, PA-C  metFORMIN (GLUCOPHAGE) 500 MG tablet Take 500 mg by mouth 2 (two) times daily with a meal.    [provider]  METFORMIN HCL ER, MOD, PO Take by mouth.    [provider]  METOPROLOL SUCCINATE ER PO Take by mouth.    [provider]  oxyCODONE-acetaminophen (PERCOCET) 10-325 MG tablet Take 1 tablet by mouth See admin instructions. Take 1 tablet by mouth 4-5 times a day as needed for pain    [provider]  oxyCODONE-acetaminophen (PERCOCET/ROXICET) 5-325 MG tablet Take 1 tablet by mouth every 8 (eight) hours as needed for severe pain. 12/16/18   Davonna Belling, MD  predniSONE (DELTASONE) 20 MG tablet Take 2 tablets (40 mg total) by mouth daily. 04/04/15   Davonna Belling, MD      VITAL SIGNS:  Blood pressure 129/61, pulse (!) 118, temperature 99.1 F (37.3 C), temperature source Oral, resp. rate 15, height 5\' 9"  (1.753 m), weight (!) 149.7 kg, SpO2 (!) 71 %.  PHYSICAL EXAMINATION:  Physical Exam  GENERAL: Acutely ill 46 y.o.-year-old African-American male patient lying in the bed mild to moderate respiratory distress on BiPAP and significant lethargy.   EYES: Pupils equal, round, reactive to light and accommodation. No scleral icterus. Extraocular muscles intact.  HEENT: Head atraumatic, normocephalic. Oropharynx and nasopharynx clear.  NECK:  Supple, no jugular venous distention. No thyroid enlargement, no tenderness.  LUNGS: Diminished bibasilar breath sounds with bibasal rales.   CARDIOVASCULAR: Regular rate and rhythm, S1, S2 normal. No murmurs, rubs, or gallops.  ABDOMEN: Soft, nondistended, nontender. Bowel sounds present. No organomegaly or mass.  EXTREMITIES: Bilateral 2+ pitting lower extremity edema, with no cyanosis, or clubbing.  NEUROLOGIC: He is not following commands.  Exam with otherwise nonfocal with no lateralizing signs. PSYCHIATRIC: The patient is very somnolent, arousable but confused. SKIN: No  obvious rash, lesion, or ulcer.   LABORATORY PANEL:   CBC Recent Labs  Lab 02/11/22 1029  WBC 7.7  HGB 13.2  HCT 44.1  PLT 198   ------------------------------------------------------------------------------------------------------------------  Chemistries  Recent Labs  Lab 02/11/22 1029  NA 135  K 4.1  CL 94*  CO2 34*  GLUCOSE 319*  BUN 11  CREATININE 0.96  CALCIUM 8.8*   ------------------------------------------------------------------------------------------------------------------  Cardiac Enzymes No results for input(s): "TROPONINI" in the last 168 hours. ------------------------------------------------------------------------------------------------------------------  RADIOLOGY:  DG Abdomen 1 View  Result Date: 02/11/2022 CLINICAL DATA:  OG tube placement EXAM: ABDOMEN - 1 VIEW COMPARISON:  None Available. FINDINGS: Esophageal tube tip overlies the stomach, side-port in the region of distal esophagus. Airspace disease at left base IMPRESSION: Esophageal tube side-port in the region of distal esophagus, suggest further advancement by 5-10 cm for more optimal positioning Electronically Signed   By: Donavan Foil M.D.   On: 02/11/2022 17:12   DG Chest Portable 1 View  Result Date: 02/11/2022 CLINICAL DATA:  Tube placement EXAM: PORTABLE CHEST 1 VIEW COMPARISON:  02/11/2022 FINDINGS: Interval intubation,  tip of the endotracheal tube is about 3.3 cm superior to carina. Esophageal tube tip poorly visible in the region of GE junction. Low lung volumes. Cardiomegaly with central congestion. No pleural effusion or pneumothorax. IMPRESSION: 1. Endotracheal tube tip about 3.3 cm superior to carina 2. Low lung volumes with cardiomegaly and central congestion Electronically Signed   By: Donavan Foil M.D.   On: 02/11/2022 17:12   DG Chest 2 View  Result Date: 02/11/2022 CLINICAL DATA:  Hypoxia EXAM: CHEST - 2 VIEW COMPARISON:  12/26/2020 FINDINGS: Cardiomegaly. Pulmonary  vascular congestion. No overt pulmonary edema. No focal airspace consolidation. No pleural effusion or pneumothorax. IMPRESSION: Cardiomegaly and pulmonary vascular congestion without overt pulmonary edema. Electronically Signed   By: Davina Poke D.O.   On: 02/11/2022 10:59      IMPRESSION AND PLAN:  Assessment and Plan: * Acute respiratory failure with hypoxia and hypercarbia (HCC) - The patient will be admitted to an ICU bed. - She was sedated and intubated in the ER. - ICU team was notified about the patient. - This is clearly secondary to acute on chronic systolic CHF and obesity hypoventilation syndrome with likely untreated obstructive sleep apnea.  Acute on chronic systolic CHF (congestive heart failure) ([place]) - I ordered a 2D echo that was performed.  The report is currently pending. - We will continue diuresis with IV Lasix. - He will need cardiology consultation.  Will defer to the ICU team. - Serial troponins to be followed.  Hypertensive urgency - This could be contributing to his acute CHF. - He will be placed on as needed IV labetalol and his antihypertensives can be placed through NG/OG tube.  Type 2 diabetes mellitus with peripheral neuropathy (HCC) - He will be placed on supplement coverage with NovoLog. - His metformin will be held off. - His Neurontin can be continued.    DVT prophylaxis: Lovenox.  Advanced Care Planning:  Code Status: full code.  Family Communication:  The plan of care was discussed in details with the patient (and family). I answered all questions. The patient agreed to proceed with the above mentioned plan. Further management will depend upon hospital course. Disposition Plan: Back to previous home environment Consults called: Intensivist. All the records are reviewed and case discussed with ED provider.  Status is: Inpatient    At the time of the admission, it appears that the appropriate admission status for this patient is  inpatient.  This is judged to be reasonable and necessary in order to provide the required intensity of service to ensure the patient's safety given the presenting symptoms, physical exam findings and initial radiographic and laboratory data in the context of comorbid conditions.  The patient requires inpatient status due to high intensity of service, high risk of further deterioration and high frequency of surveillance required.  I certify that at the time of admission, it is my clinical judgment that the patient will require inpatient hospital care extending more than 2 midnights.                            Dispo: The patient is from: Home              Anticipated d/c is to: Home              Patient currently is not medically stable to d/c.              Difficult to place patient: No  Authorized and performed by: Eugenie Norrie, MD Total critical care time: Approximately  50    minutes. Due to a high probability of clinically significant, life-threatening deterioration, the patient required my highest level of preparedness to intervene emergently and I personally spent this critical care time directly and personally managing the patient.  This critical care time included obtaining a history, examining the patient, pulse oximetry, ordering and review of studies, arranging urgent treatment with development of management plan, evaluation of patient's response to treatment, frequent reassessment, and discussions with other providers. This critical care time was performed to assess and manage the high probability of imminent, life-threatening deterioration that could result in multiorgan failure.  It was exclusive of separately billable procedures and treating other patients and teaching time.   Christel Mormon M.D on 02/11/2022 at 5:20 PM  [facility]   From 7 PM-7 AM, contact night-coverage [email or web address]  CC: Primary care physician; [facility]

## 2022-02-12 ENCOUNTER — Inpatient Hospital Stay: Payer: Medicaid Other

## 2022-02-12 ENCOUNTER — Inpatient Hospital Stay: Payer: Self-pay

## 2022-02-12 DIAGNOSIS — J9602 Acute respiratory failure with hypercapnia: Secondary | ICD-10-CM | POA: Diagnosis not present

## 2022-02-12 DIAGNOSIS — J9601 Acute respiratory failure with hypoxia: Secondary | ICD-10-CM | POA: Diagnosis not present

## 2022-02-12 LAB — GLUCOSE, CAPILLARY
Glucose-Capillary: 214 mg/dL — ABNORMAL HIGH (ref 70–99)
Glucose-Capillary: 244 mg/dL — ABNORMAL HIGH (ref 70–99)
Glucose-Capillary: 286 mg/dL — ABNORMAL HIGH (ref 70–99)
Glucose-Capillary: 295 mg/dL — ABNORMAL HIGH (ref 70–99)
Glucose-Capillary: 309 mg/dL — ABNORMAL HIGH (ref 70–99)
Glucose-Capillary: 360 mg/dL — ABNORMAL HIGH (ref 70–99)

## 2022-02-12 LAB — BASIC METABOLIC PANEL
Anion gap: 13 (ref 5–15)
BUN: 17 mg/dL (ref 6–20)
CO2: 28 mmol/L (ref 22–32)
Calcium: 8.3 mg/dL — ABNORMAL LOW (ref 8.9–10.3)
Chloride: 91 mmol/L — ABNORMAL LOW (ref 98–111)
Creatinine, Ser: 1.44 mg/dL — ABNORMAL HIGH (ref 0.61–1.24)
GFR, Estimated: >60 mL/min (ref >60)
Glucose, Bld: 335 mg/dL — ABNORMAL HIGH (ref 70–99)
Potassium: 4.5 mmol/L (ref 3.5–5.1)
Sodium: 132 mmol/L — ABNORMAL LOW (ref 135–145)

## 2022-02-12 LAB — HIV ANTIBODY (ROUTINE TESTING W REFLEX): HIV Screen 4th Generation wRfx: NONREACTIVE

## 2022-02-12 LAB — HEMOGLOBIN A1C
Hgb A1c MFr Bld: 10.1 % — ABNORMAL HIGH (ref 4.8–5.6)
Mean Plasma Glucose: 243.17 mg/dL

## 2022-02-12 LAB — ECHOCARDIOGRAM COMPLETE
Area-P 1/2: 3.37 cm2
Height: 69 in
S' Lateral: 3.9 cm
Weight: 6028.26 oz

## 2022-02-12 LAB — PROCALCITONIN: Procalcitonin: <0.1 ng/mL

## 2022-02-12 LAB — LACTIC ACID, PLASMA
Lactic Acid, Venous: 4.1 mmol/L (ref 0.5–1.9)
Lactic Acid, Venous: 3.4 mmol/L (ref 0.5–1.9)

## 2022-02-12 LAB — CBC
HCT: 42.5 % (ref 39.0–52.0)
Hemoglobin: 12.8 g/dL — ABNORMAL LOW (ref 13.0–17.0)
MCH: 26.8 pg (ref 26.0–34.0)
MCHC: 30.1 g/dL (ref 30.0–36.0)
MCV: 88.9 fL (ref 80.0–100.0)
Platelets: 229 10*3/uL (ref 150–400)
RBC: 4.78 MIL/uL (ref 4.22–5.81)
RDW: 14.2 % (ref 11.5–15.5)
WBC: 11.1 10*3/uL — ABNORMAL HIGH (ref 4.0–10.5)
nRBC: 0 % (ref 0.0–0.2)

## 2022-02-12 LAB — TRIGLYCERIDES: Triglycerides: 363 mg/dL — ABNORMAL HIGH (ref <150)

## 2022-02-12 LAB — TROPONIN I (HIGH SENSITIVITY): Troponin I (High Sensitivity): 22 ng/L — ABNORMAL HIGH (ref <18)

## 2022-02-12 MED ORDER — NOREPINEPHRINE 16 MG/250ML-% IV SOLN
0.0000 ug/min | INTRAVENOUS | Status: DC
  Filled 2022-02-12 (×2): qty 250

## 2022-02-12 MED ORDER — VITAL HIGH PROTEIN PO LIQD
1000.0000 mL | ORAL | Status: DC
  Administered 2022-02-12: 1000 mL

## 2022-02-12 MED ORDER — LIVING WELL WITH DIABETES BOOK
Freq: Once | Status: DC
  Filled 2022-02-12: qty 1

## 2022-02-12 MED ORDER — SODIUM CHLORIDE 0.9% FLUSH
10.0000 mL | INTRAVENOUS | Status: DC | PRN
  Administered 2022-02-15: 40 mL

## 2022-02-12 MED ORDER — DIAZEPAM 5 MG PO TABS
10.0000 mg | ORAL_TABLET | Freq: Four times a day (QID) | ORAL | Status: DC
  Administered 2022-02-12 – 2022-02-16 (×16): 10 mg
  Filled 2022-02-12 (×16): qty 2

## 2022-02-12 MED ORDER — PROSOURCE TF PO LIQD
90.0000 mL | Freq: Every day | ORAL | Status: DC
Start: 2022-02-12 — End: 2022-02-15
  Administered 2022-02-12 – 2022-02-14 (×12): 90 mL
  Filled 2022-02-12: qty 90

## 2022-02-12 MED ORDER — LORAZEPAM 2 MG/ML IJ SOLN
2.0000 mg | INTRAMUSCULAR | Status: DC | PRN
  Administered 2022-02-12: 2 mg via INTRAVENOUS
  Administered 2022-02-12: 4 mg via INTRAVENOUS
  Administered 2022-02-12 (×3): 2 mg via INTRAVENOUS
  Administered 2022-02-13 (×5): 4 mg via INTRAVENOUS
  Administered 2022-02-13 – 2022-02-14 (×4): 2 mg via INTRAVENOUS
  Filled 2022-02-12 (×6): qty 2
  Filled 2022-02-12 (×2): qty 1
  Filled 2022-02-12 (×2): qty 2
  Filled 2022-02-12 (×5): qty 1

## 2022-02-12 MED ORDER — INSULIN GLARGINE-YFGN 100 UNIT/ML ~~LOC~~ SOLN
10.0000 [IU] | Freq: Every day | SUBCUTANEOUS | Status: DC
  Administered 2022-02-12: 10 [IU] via SUBCUTANEOUS
  Filled 2022-02-12 (×2): qty 0.1

## 2022-02-12 MED ORDER — ADULT MULTIVITAMIN LIQUID CH
15.0000 mL | Freq: Every day | ORAL | Status: DC
  Administered 2022-02-13: 15 mL
  Filled 2022-02-12: qty 15

## 2022-02-12 MED ORDER — IPRATROPIUM-ALBUTEROL 0.5-2.5 (3) MG/3ML IN SOLN
3.0000 mL | Freq: Four times a day (QID) | RESPIRATORY_TRACT | Status: DC
  Administered 2022-02-12 – 2022-03-29 (×178): 3 mL via RESPIRATORY_TRACT
  Filled 2022-02-12 (×177): qty 3

## 2022-02-12 MED ORDER — FREE WATER
30.0000 mL | Status: DC
  Administered 2022-02-12 – 2022-02-18 (×35): 30 mL

## 2022-02-12 MED ORDER — SODIUM CHLORIDE 0.9 % IV SOLN
0.5000 mg/kg/h | INTRAVENOUS | Status: DC
  Administered 2022-02-12: 0.5 mg/kg/h via INTRAVENOUS
  Administered 2022-02-13: 1.5 mg/kg/h via INTRAVENOUS
  Administered 2022-02-14: 2 mg/kg/h via INTRAVENOUS
  Administered 2022-02-15 (×2): 3 mg/kg/h via INTRAVENOUS
  Administered 2022-02-15: 3.001 mg/kg/h via INTRAVENOUS
  Administered 2022-02-16 (×2): 3 mg/kg/h via INTRAVENOUS
  Filled 2022-02-12 (×12): qty 50

## 2022-02-12 MED ORDER — ENOXAPARIN SODIUM 100 MG/ML IJ SOSY
0.5000 mg/kg | PREFILLED_SYRINGE | INTRAMUSCULAR | Status: DC
  Administered 2022-02-12 – 2022-02-13 (×2): 85 mg via SUBCUTANEOUS
  Filled 2022-02-12 (×2): qty 0.85

## 2022-02-12 MED ORDER — OXYCODONE HCL 5 MG PO TABS
5.0000 mg | ORAL_TABLET | Freq: Four times a day (QID) | ORAL | Status: DC
  Administered 2022-02-12 – 2022-02-13 (×5): 5 mg
  Filled 2022-02-12 (×5): qty 1

## 2022-02-12 MED ORDER — INSULIN GLARGINE 100 UNIT/ML ~~LOC~~ SOLN
10.0000 [IU] | Freq: Every day | SUBCUTANEOUS | Status: DC
  Filled 2022-02-12: qty 0.1

## 2022-02-12 MED ORDER — SODIUM CHLORIDE 0.9% FLUSH
10.0000 mL | Freq: Two times a day (BID) | INTRAVENOUS | Status: DC
  Administered 2022-02-12 – 2022-02-16 (×9): 10 mL
  Administered 2022-02-17: 20 mL
  Administered 2022-02-17 – 2022-02-18 (×2): 10 mL
  Administered 2022-02-18: 30 mL
  Administered 2022-02-19: 20 mL
  Administered 2022-02-19 – 2022-02-28 (×18): 10 mL
  Administered 2022-02-28: 30 mL
  Administered 2022-03-01 (×2): 10 mL
  Administered 2022-03-02 – 2022-03-03 (×2): 20 mL
  Administered 2022-03-03: 30 mL
  Administered 2022-03-04 – 2022-03-07 (×7): 10 mL

## 2022-02-12 MED ORDER — INSULIN STARTER KIT- PEN NEEDLES (ENGLISH)
1.0000 | Freq: Once | Status: DC
  Filled 2022-02-12: qty 1

## 2022-02-12 NOTE — Inpatient Diabetes Management (Signed)
Inpatient Diabetes Program Recommendations  AACE/ADA: New Consensus Statement on Inpatient Glycemic Control (2015)  Target Ranges:  Prepandial:   less than 140 mg/dL      Peak postprandial:   less than 180 mg/dL (1-2 hours)      Critically ill patients:  140 - 180 mg/dL   Lab Results  Component Value Date   GLUCAP 295 (H) 02/12/2022   HGBA1C 10.1 (H) 02/11/2022    Review of Glycemic Control  Latest Reference Range & Units 02/11/22 17:55 02/11/22 23:28 02/12/22 03:59 02/12/22 07:23  Glucose-Capillary 70 - 99 mg/dL 308 (H) 338 (H) 360 (H) 295 (H)  (H): Data is abnormally high   Latest Reference Range & Units 02/11/22 10:29  Hemoglobin A1C 4.8 - 5.6 % 10.1 (H)  (H): Data is abnormally high  Diabetes history:  DM2 Outpatient Diabetes medications: Metformin 500 mg BID Current orders for Inpatient glycemic control: novolog 0-20 units Q4H, Solumedrol 40 mg Q12H, NPO  Inpatient Diabetes Program Recommendations:    Please consider:  Semglee 26 units QD (170.9 kg x 0.15 units)   Will speak to him today regarding A1C of 10.1%  Will continue to follow while inpatient.  Thank you, Reche Dixon, MSN, [place] Diabetes Coordinator Inpatient Diabetes Program [phone] (team pager from 8a-5p) ding A1C.  Ordered LWWD booklet.

## 2022-02-12 NOTE — Progress Notes (Signed)
NAME:  Samuel Chavez, MRN:  [id number], DOB:  08-26-1975, LOS: 1 ADMISSION DATE:  02/11/2022, CONSULTATION DATE: 02/11/22 REFERRING MD:  Eugenie Norrie MD  CHIEF COMPLAINT: SOB   CC follow up RESP FAILURE/acute CHF exacerbation  HPI  46 y.o male with significant PMH as below who presented to the ED with chief complaints of SOB and worsening bilateral lower extremities edema.  ED Course: In the emergency department, the temperature was 37.3C, the heart rate 99 beats/minute, the blood pressure 187/102 mm Hg, the respiratory rate 20 breaths/minute, and the oxygen saturation 89% on. He was noted to be drowsy but still protecting his airway and responding appropriately.  Pertinent Labs/Diagnostics Findings: Chemistry:Glucose:319 Calcium: 8.8 otherwise unremarkable CBC: unremarkable Other Lab findings:  COVID PCR: Negative, BNP: pending, Venous Blood Gas result:  pO2 pending; pCO2 87; pH 7.27;  HCO3 40, %O2 Sat 48.7.  Imaging: Chest x-ray showed cardiomegaly and pulmonary vascular congestion without overt pulmonary edema  Patient was placed on BiPAP for acute on chronic hypoxic hypercapnic respiratory failure.  He was treated with IV Lasix, Solu-Medrol, DuoNebs and admitted to hospitalist service.  Patient was initially improving on BiPAP however on reassessment he was noted to be minimally responsive with snoring respirations.  Due to concerns for worsening hypercapnia and impending respiratory failure he was intubated for airway protection.  Past Medical History  Hypertension  OSA (obstructive sleep apnea)  CHF (congestive heart failure), NYHA class III, acute on chronic, diastolic (HCC) LVEF about 35% Bell's palsy of the Left face Type 2 diabetes mellitus, without long-term current use of insulin  Morbid obesity with BMI of 50.0-59.9  Diabetic neuropathy  Noncompliance with medications Pulmonary hypertension COPD Tobacco abuse currently 1 pack/day x 22 years  Significant Hospital  Events   7/25: Admitted to hospitalist service w/acute on chronic hypoxic hypercapnic resp. failure requiring BiPAP.Failed BiPAP and intubated. PCCM consulted 7/26 INTUBATED  Consults:  PCCM  Procedures:  7/25: Intubation  Significant Diagnostic Tests:  7/25: Chest Xray> Chest x-ray showed cardiomegaly and pulmonary vascular congestion without overt pulmonary edema   Micro Data:  7/25: SARS-CoV-2 PCR> negative 7/25: MRSA PCR>>   Antimicrobials:  None  OBJECTIVE  Blood pressure 126/75, pulse 90, temperature 99.3 F (37.4 C), temperature source Bladder, resp. rate 20, height 5' 9.02" (1.753 m), weight (!) 170.9 kg, SpO2 97 %.    Vent Mode: PRVC FiO2 (%):  [100 %] 100 % Set Rate:  [20 bmp] 20 bmp Vt Set:  [525 mL-570 mL] 570 mL PEEP:  [10 cmH20] 10 cmH20   Intake/Output Summary (Last 24 hours) at 02/12/2022 0706 Last data filed at 02/12/2022 0600 Gross per 24 hour  Intake 1047.76 ml  Output 1725 ml  Net -677.24 ml    Filed Weights   02/11/22 1027 02/11/22 1740 02/12/22 0322  Weight: (!) 149.7 kg (!) 170.9 kg (!) 170.9 kg    REVIEW OF SYSTEMS  PATIENT IS UNABLE TO PROVIDE COMPLETE REVIEW OF SYSTEMS DUE TO SEVERE CRITICAL ILLNESS    PHYSICAL EXAMINATION:  GENERAL:critically ill appearing, +resp distress EYES: Pupils equal, round, reactive to light.  No scleral icterus.  MOUTH: Moist mucosal membrane. INTUBATED NECK: Supple.  PULMONARY: +rhonchi, +wheezing CARDIOVASCULAR: S1 and S2.  No murmurs  GASTROINTESTINAL: Soft, nontender, -distended. Positive bowel sounds.  MUSCULOSKELETAL: No swelling, clubbing, or edema.  NEUROLOGIC: obtunded SKIN:intact,warm,dry   Labs/imaging that I havepersonally reviewed  (right click and "Reselect all SmartList Selections" daily)     Labs   CBC: Recent  Labs  Lab 02/11/22 1029 02/12/22 0254  WBC 7.7 11.1*  HGB 13.2 12.8*  HCT 44.1 42.5  MCV 89.3 88.9  PLT 198 229     Basic Metabolic Panel: Recent Labs  Lab  02/11/22 1029 02/11/22 2218 02/12/22 0254  NA 135 133* 132*  K 4.1 4.6 4.5  CL 94* 92* 91*  CO2 34* 29 28  GLUCOSE 319* 299* 335*  BUN 11 14 17   CREATININE 0.96 1.32* 1.44*  CALCIUM 8.8* 8.3* 8.3*  MG  --  1.6*  --     GFR: Estimated Creatinine Clearance: 102.6 mL/min (A) (by C-G formula based on SCr of 1.44 mg/dL (H)). Recent Labs  Lab 02/11/22 1029 02/11/22 2218 02/12/22 0051 02/12/22 0254 02/12/22 0602  PROCALCITON  --  <0.10  --   --   --   WBC 7.7  --   --  11.1*  --   LATICACIDVEN  --  4.0* 4.1*  --  3.4*     Liver Function Tests: Recent Labs  Lab 02/11/22 2218  AST 23  ALT 20  ALKPHOS 69  BILITOT 0.8  PROT 7.2  ALBUMIN 3.5   No results for input(s): "LIPASE", "AMYLASE" in the last 168 hours. No results for input(s): "AMMONIA" in the last 168 hours.  ABG    Component Value Date/Time   PHART 7.38 02/11/2022 2318   PCO2ART 59 (H) 02/11/2022 2318   PO2ART 83 02/11/2022 2318   HCO3 34.9 (H) 02/11/2022 2318   TCO2 26 03/17/2007 1822   O2SAT 97.8 02/11/2022 2318     Home Medications  Prior to Admission medications   Medication Sig Start Date End Date Taking? Authorizing Provider  losartan (COZAAR) 100 MG tablet Take 100 mg by mouth daily. 02/01/22  Yes [provider]  OZEMPIC, 0.25 OR 0.5 MG/DOSE, 2 MG/3ML SOPN SMARTSIG:0.25 Milligram(s) SUB-Q Once a Week 12/09/21  Yes [provider]  torsemide (DEMADEX) 20 MG tablet Take 20 mg by mouth daily. 12/18/21  Yes [provider]  Aspirin-Salicylamide-Caffeine (BC HEADACHE POWDER PO) Take 1 packet by mouth as needed (for pain).    [provider]  cephALEXin (KEFLEX) 500 MG capsule Take 1 capsule (500 mg total) by mouth 4 (four) times daily. Patient not taking: Reported on 02/11/2022 12/16/18   Davonna Belling, MD  cyclobenzaprine (FLEXERIL) 10 MG tablet Take 0.5-1 tablets (5-10 mg total) by mouth 2 (two) times daily as needed for muscle spasms. Patient not taking: Reported  on 02/11/2022 03/12/18   Margarita Mail, PA-C  gabapentin (NEURONTIN) 600 MG tablet Take 600 mg by mouth at bedtime. Patient not taking: Reported on 02/11/2022 10/13/21   [provider]  LABETALOL HCL PO Take by mouth. Patient not taking: Reported on 02/11/2022    [provider]  meloxicam (MOBIC) 15 MG tablet Take 1 tablet (15 mg total) by mouth daily. Take 1 daily with food. Patient not taking: Reported on 02/11/2022 03/12/18   Margarita Mail, PA-C  metFORMIN (GLUCOPHAGE) 500 MG tablet Take 500 mg by mouth 2 (two) times daily with a meal. Patient not taking: Reported on 02/11/2022    [provider]  METFORMIN HCL ER, MOD, PO Take by mouth. Patient not taking: Reported on 02/11/2022    [provider]  METOPROLOL SUCCINATE ER PO Take by mouth. Patient not taking: Reported on 02/11/2022    [provider]  oxyCODONE-acetaminophen (PERCOCET) 10-325 MG tablet Take 1 tablet by mouth See admin instructions. Take 1 tablet by mouth 4-5  times a day as needed for pain Patient not taking: Reported on 02/11/2022    [provider]  oxyCODONE-acetaminophen (PERCOCET/ROXICET) 5-325 MG tablet Take 1 tablet by mouth every 8 (eight) hours as needed for severe pain. Patient not taking: Reported on 02/11/2022 12/16/18   Davonna Belling, MD  predniSONE (DELTASONE) 20 MG tablet Take 2 tablets (40 mg total) by mouth daily. Patient not taking: Reported on 02/11/2022 04/04/15   Davonna Belling, MD   Scheduled Meds:  budesonide (PULMICORT) nebulizer solution  0.5 mg Nebulization BID   Chlorhexidine Gluconate Cloth  6 each Topical Q0600   diazepam  10 mg Oral Q6H   docusate sodium  100 mg Oral BID   enoxaparin (LOVENOX) injection  0.5 mg/kg Subcutaneous Q24H   fentaNYL (SUBLIMAZE) injection  50 mcg Intravenous Once   furosemide  40 mg Intravenous Q12H   insulin aspart  0-20 Units Subcutaneous Q4H   ipratropium-albuterol  3 mL Nebulization Q4H   methylPREDNISolone  (SOLU-MEDROL) injection  40 mg Intravenous Q12H   metoprolol succinate  25 mg Oral Daily   mouth rinse  15 mL Mouth Rinse Q2H   oxyCODONE  5 mg Oral Q6H   pantoprazole (PROTONIX) IV  40 mg Intravenous QHS   polyethylene glycol  17 g Oral Daily   sodium chloride flush  3 mL Intravenous Q12H   Continuous Infusions:  sodium chloride     sodium chloride Stopped (02/11/22 2011)   fentaNYL infusion INTRAVENOUS 200 mcg/hr (02/12/22 0600)   midazolam 10 mg/hr (02/12/22 0600)   norepinephrine (LEVOPHED) Adult infusion 4 mcg/min (02/12/22 0600)   propofol (DIPRIVAN) infusion 50 mcg/kg/min (02/12/22 0600)   PRN Meds:.sodium chloride, acetaminophen **OR** acetaminophen, cyclobenzaprine, docusate sodium, fentaNYL, magnesium hydroxide, ondansetron **OR** ondansetron (ZOFRAN) IV, mouth rinse, oxyCODONE-acetaminophen, polyethylene glycol, sodium chloride flush, traZODone, vecuronium  Active Hospital Problem list     Assessment & Plan:   46 yo morbidly obese AAM with severe decompensated acute systolic and diastolic heart failure with high likelihood of underlying OSA/OHS active smoker   Acute Hypoxic Hypercapnic Respiratory Failure secondary to CHF exacerbation and possible AECOPD PMHx: COPD, OSA noncompliant w/CPAP, Current everyday smoker -continue Mechanical Ventilator support -Wean Fio2 and PEEP as tolerated -VAP/VENT bundle implementation - Wean PEEP & FiO2 as tolerated, maintain SpO2 > 88% - Head of bed elevated 30 degrees, VAP protocol in place - Plateau pressures less than 30 cm H20  - Intermittent chest x-ray & ABG PRN - Ensure adequate pulmonary hygiene  -will NOT  perform SAT/SBT   Vent Mode: PRVC FiO2 (%):  [100 %] 100 % Set Rate:  [20 bmp] 20 bmp Vt Set:  [525 mL-570 mL] 570 mL PEEP:  [10 cmH20] 10 [id number]   ACUTE SYSTOLIC CARDIAC FAILURE- EF 35% Vent support -follow up cardiac enzymes as indicated -follow up cardiology recs -IV Lasix as blood pressure and renal function  permits; currently on Lasix 40 mg IV BID -Hold metoprolol and Losartan due to sedation related hypotension -Repeat 2D Echocardiogram   AKI -continue Foley Catheter-assess need -Avoid nephrotoxic agents -Follow urine output, BMP -Ensure adequate renal perfusion, optimize oxygenation -Renal dose medications   Intake/Output Summary (Last 24 hours) at 02/12/2022 0711 Last data filed at 02/12/2022 0600 Gross per 24 hour  Intake 1047.76 ml  Output 1725 ml  Net -677.24 ml      Latest Ref Rng & Units 02/12/2022    2:54 AM 02/11/2022   10:18 PM 02/11/2022   10:29 AM  BMP  Glucose 70 -  99 mg/dL 335  299  319   BUN 6 - 20 mg/dL 17  14  11    Creatinine 0.61 - 1.24 mg/dL 1.44  1.32  0.96   Sodium 135 - 145 mmol/L 132  133  135   Potassium 3.5 - 5.1 mmol/L 4.5  4.6  4.1   Chloride 98 - 111 mmol/L 91  92  94   CO2 22 - 32 mmol/L 28  29  34   Calcium 8.9 - 10.3 mg/dL 8.3  8.3  8.8     ENDO - ICU hypoglycemic\Hyperglycemia protocol -check FSBS per protocol Diabetes mellitus HgbA1c  10.1 -CBG's AC & hs; Target range of 140 to 180 -SSI -Follow ICU Hypo/Hyperglycemia protocol -Hold Metformin  GI GI PROPHYLAXIS as indicated  NUTRITIONAL STATUS DIET-->TF's as tolerated Constipation protocol as indicated   ELECTROLYTES -follow labs as needed -replace as needed -pharmacy consultation and following   AT THIS POINT, PATIENT WITH SEVERE DECOMPENSATED HEART FAILURE AND COPD ACTIVE SMOKER, WITH OSA/OHS WITH SEVERE HYPOXIA WITH LARGE TONGUE AND DIFFICULT AIRWAY WITH SEVERE AGITATION, PATIENT WILL NEED TRACH TO SURVIVE  WILL DISCUSS WITH FAMILY AND THEN OBTAIN ENT CONSULT SOONER THAN LATER   Best practice:  Diet:  NPO Pain/Anxiety/Delirium protocol (if indicated): Yes (RASS goal 0) VAP protocol (if indicated): Yes DVT prophylaxis: LMWH GI prophylaxis: PPI Glucose control:  SSI Yes Central venous access:  N/A Arterial line:  N/A Foley:  Yes, and it is still needed Mobility:  bed  rest  PT consulted: N/A Last date of multidisciplinary goals of care discussion [7/25] Code Status:  full code Disposition: ICU    DVT/GI PRX  assessed I Assessed the need for Labs I Assessed the need for Foley I Assessed the need for Central Venous Line Family Discussion when available I Assessed the need for Mobilization I made an Assessment of medications to be adjusted accordingly Safety Risk assessment completed  CASE DISCUSSED IN MULTIDISCIPLINARY ROUNDS WITH ICU TEAM     Critical Care Time devoted to patient care services described in this note is 55 minutes.  Critical care was necessary to treat /prevent imminent and life-threatening deterioration. Overall, patient is critically ill, prognosis is guarded.  Patient with Multiorgan failure and at high risk for cardiac arrest and death.    Corrin Parker, M.D.  [facility]  Medical Director [place] Director [facility] Cardio-Pulmonary Department

## 2022-02-12 NOTE — Progress Notes (Signed)
Peripherally Inserted Central Catheter Placement  The IV Nurse has discussed with the patient and/or persons authorized to consent for the patient, the purpose of this procedure and the potential benefits and risks involved with this procedure.  The benefits include less needle sticks, lab draws from the catheter, and the patient may be discharged home with the catheter. Risks include, but not limited to, infection, bleeding, blood clot (thrombus formation), and puncture of an artery; nerve damage and irregular heartbeat and possibility to perform a PICC exchange if needed/ordered by physician.  Alternatives to this procedure were also discussed.  Bard Power PICC patient education guide, fact sheet on infection prevention and patient information card has been provided to patient /or left at bedside.    PICC Placement Documentation  PICC Triple Lumen 93/90/30 Right Basilic 48 cm 0 cm (Active)  Indication for Insertion or Continuance of Line Vasoactive infusions 02/12/22 1135  Exposed Catheter (cm) 0 cm 02/12/22 1135  Site Assessment Clean, Dry, Intact 02/12/22 1135  Lumen #1 Status Flushed;Saline locked;Blood return noted 02/12/22 1135  Lumen #2 Status Flushed;Saline locked;Blood return noted 02/12/22 1135  Lumen #3 Status Flushed;Saline locked;Blood return noted 02/12/22 1135  Dressing Type Transparent;Securing device 02/12/22 1135  Dressing Status Antimicrobial disc in place;Clean, Dry, Intact 02/12/22 1135  Safety Lock Not Applicable 04/12/29 0762  Line Adjustment (NICU/IV Team Only) No 02/12/22 1135  Dressing Intervention New dressing;Other (Comment) 02/12/22 1135  Dressing Change Due 02/19/22 02/12/22 1135    Patient's daughter, Letta Kocher, is at bedside.Signed PICC consent d/t patient is sedated and intubated.   Enos Fling 02/12/2022, 11:48 AM

## 2022-02-12 NOTE — Progress Notes (Signed)
Initial Nutrition Assessment  DOCUMENTATION CODES:   Morbid obesity  INTERVENTION:   Vital HP @20ml /hr + ProSource TF 48ml five times daily via tube  Free water flushes 49ml q4 hours to maintain tube patency   Propofol: 44.9 ml/hr- provides 1185kcal/day   Regimen provides 2065kcal/day, 152g/day protein and 567ml/day of free water   Liquid MVI daily via tube   NUTRITION DIAGNOSIS:   Inadequate oral intake related to inability to eat (pt sedated and ventilated) as evidenced by NPO status.  GOAL:   Provide needs based on ASPEN/SCCM guidelines  MONITOR:   Vent status, Labs, Weight trends, TF tolerance, Skin, I & O's  REASON FOR ASSESSMENT:   Ventilator    ASSESSMENT:   46 y/o male with h/o COPD, CHF, HTN, DM and OSA who is admitted with CHF exacerbation.  Pt sedated and ventilated. OGT in place. Will plan to start trickle feeds today. Per chart, pt reported a recent 30lb weight gain. Per chart, pt's UBW appears to be ~345lbs; pt is currently up ~31lbs from his UBW. Pt -285ml on his I & Os.   Medications reviewed and include: colace, lovenox, lasix, insulin, solu-medrol, oxycodone, protonix, miralax, levophed, propofol   Labs reviewed: Na 132(L), creat 1.44(H), Mg 1.6(L) Triglycerides- 363(H) Wbc- 11.1(H) Cbgs- 295, 360 x 24 hrs AIC 10.1(H)- 7/15  Patient is currently intubated on ventilator support MV: 10.8 L/min Temp (24hrs), Avg:98.8 F (37.1 C), Min:98.1 F (36.7 C), Max:99.3 F (37.4 C)  Propofol: 44.9 ml/hr- provides 1185kcal/day   MAP- >54mmHg   UOP- 1724ml   NUTRITION - FOCUSED PHYSICAL EXAM:  Flowsheet Row Most Recent Value  Orbital Region No depletion  Upper Arm Region No depletion  Thoracic and Lumbar Region No depletion  Buccal Region No depletion  Temple Region No depletion  Clavicle Bone Region No depletion  Clavicle and Acromion Bone Region No depletion  Scapular Bone Region No depletion  Dorsal Hand No depletion  Patellar Region No  depletion  Anterior Thigh Region No depletion  Posterior Calf Region No depletion  Edema (RD Assessment) Moderate  Hair Reviewed  Eyes Reviewed  Mouth Reviewed  Skin Reviewed  Nails Reviewed   Diet Order:   Diet Order             Diet NPO time specified  Diet effective now                  EDUCATION NEEDS:   Not appropriate for education at this time  Skin:  Skin Assessment: Reviewed RN Assessment  Last BM:  pta  Height:   Ht Readings from Last 1 Encounters:  02/12/22 5' 9.02" (1.753 m)    Weight:   Wt Readings from Last 1 Encounters:  02/12/22 (!) 170.9 kg    Ideal Body Weight:  72.7 kg  BMI:  Body mass index is 55.61 kg/m.  Estimated Nutritional Needs:   Kcal:  1600-1800kcal/day  Protein:  182g/day protein  Fluid:  2.0L/day  Koleen Distance MS, RD, LDN Please refer to [facility] for RD and/or RD on-call/weekend/after hours pager

## 2022-02-12 NOTE — Consult Note (Signed)
PHARMACY CONSULT NOTE  Pharmacy Consult for Electrolyte Monitoring and Replacement   Recent Labs: Potassium (mmol/L)  Date Value  02/12/2022 4.5   Magnesium (mg/dL)  Date Value  02/11/2022 1.6 (L)   Calcium (mg/dL)  Date Value  02/12/2022 8.3 (L)   Albumin (g/dL)  Date Value  02/11/2022 3.5   Sodium (mmol/L)  Date Value  02/12/2022 132 (L)   Assessment: Patient is a 46 y/o M with medical history including DM, HTN, pancreatitis, tobacco use disorder, systolic CHF, OSA, DM c/b diabetic neuropathy, lumbar radiculopathy, morbid obesity who is admitted with acute respiratory failure in setting of acute CHF and hypertensive urgency. Patient is currently intubated, sedated, and on mechanical ventilation in the ICU. Pharmacy consulted to assist with electrolyte monitoring and replacement as indicated.  Nutrition: Trickle feeds started 7/26 Diuretics: IV Lasix 40 mg BID  Goal of Therapy:  Electrolytes within normal limits  Plan:  --Pseudohyponatremia in setting of hyperglycemia --No electrolyte replacement indicated at this time --Follow-up electrolytes with AM labs tomorrow  Benita Gutter 02/12/2022 8:45 AM

## 2022-02-12 NOTE — IPAL (Signed)
  Interdisciplinary Goals of Care Family Meeting   Date carried out: 02/12/2022  Location of the meeting: Bedside  Member's involved: Physician, Bedside Registered Nurse, and Family Member or next of kin       GOALS OF CARE DISCUSSION  The Clinical status was relayed to family in detail-Daughter at Bedside Sister on the phone  Updated and notified of patients medical condition- Patient remains unresponsive and will not open eyes to command.   Patient is having a weak cough and struggling to remove secretions.   Patient with increased WOB and using accessory muscles to breathe Explained to family course of therapy and the modalities   Patient with Progressive multiorgan failure with a very high probablity of a very minimal chance of meaningful recovery despite all aggressive and optimal medical therapy.  PATIENT REMAINS FULL CODE  Family does NOT quite understand the situation. I explained that patient has long road ahead for recovery, has heart and lung disease and now kidneys comprised. I have explained that patient will likely need [facility] for survival    Family are satisfied with Plan of action and management. All questions answered  Additional CC time 25 mins   Mykala Mccready Patricia Pesa, M.D.  [facility]  Medical Director [place] Director [facility] Cardio-Pulmonary Department

## 2022-02-13 ENCOUNTER — Inpatient Hospital Stay: Payer: Medicaid Other

## 2022-02-13 DIAGNOSIS — J9602 Acute respiratory failure with hypercapnia: Secondary | ICD-10-CM | POA: Diagnosis not present

## 2022-02-13 DIAGNOSIS — J9601 Acute respiratory failure with hypoxia: Secondary | ICD-10-CM | POA: Diagnosis not present

## 2022-02-13 LAB — PROCALCITONIN: Procalcitonin: <0.1 ng/mL

## 2022-02-13 LAB — GLUCOSE, CAPILLARY
Glucose-Capillary: 261 mg/dL — ABNORMAL HIGH (ref 70–99)
Glucose-Capillary: 257 mg/dL — ABNORMAL HIGH (ref 70–99)
Glucose-Capillary: 243 mg/dL — ABNORMAL HIGH (ref 70–99)
Glucose-Capillary: 296 mg/dL — ABNORMAL HIGH (ref 70–99)
Glucose-Capillary: 274 mg/dL — ABNORMAL HIGH (ref 70–99)
Glucose-Capillary: 260 mg/dL — ABNORMAL HIGH (ref 70–99)

## 2022-02-13 LAB — MAGNESIUM: Magnesium: 2.1 mg/dL (ref 1.7–2.4)

## 2022-02-13 LAB — CBC
HCT: 39.2 % (ref 39.0–52.0)
Hemoglobin: 11.7 g/dL — ABNORMAL LOW (ref 13.0–17.0)
MCH: 26.5 pg (ref 26.0–34.0)
MCHC: 29.8 g/dL — ABNORMAL LOW (ref 30.0–36.0)
MCV: 88.7 fL (ref 80.0–100.0)
Platelets: 197 10*3/uL (ref 150–400)
RBC: 4.42 MIL/uL (ref 4.22–5.81)
RDW: 14.8 % (ref 11.5–15.5)
WBC: 16.3 10*3/uL — ABNORMAL HIGH (ref 4.0–10.5)
nRBC: 0 % (ref 0.0–0.2)

## 2022-02-13 LAB — BASIC METABOLIC PANEL
Anion gap: 10 (ref 5–15)
Anion gap: 7 (ref 5–15)
BUN: 36 mg/dL — ABNORMAL HIGH (ref 6–20)
BUN: 43 mg/dL — ABNORMAL HIGH (ref 6–20)
CO2: 31 mmol/L (ref 22–32)
CO2: 33 mmol/L — ABNORMAL HIGH (ref 22–32)
Calcium: 8.2 mg/dL — ABNORMAL LOW (ref 8.9–10.3)
Calcium: 8.2 mg/dL — ABNORMAL LOW (ref 8.9–10.3)
Chloride: 94 mmol/L — ABNORMAL LOW (ref 98–111)
Chloride: 97 mmol/L — ABNORMAL LOW (ref 98–111)
Creatinine, Ser: 1.68 mg/dL — ABNORMAL HIGH (ref 0.61–1.24)
Creatinine, Ser: 1.51 mg/dL — ABNORMAL HIGH (ref 0.61–1.24)
GFR, Estimated: 51 mL/min — ABNORMAL LOW (ref >60)
GFR, Estimated: 58 mL/min — ABNORMAL LOW (ref >60)
Glucose, Bld: 273 mg/dL — ABNORMAL HIGH (ref 70–99)
Glucose, Bld: 296 mg/dL — ABNORMAL HIGH (ref 70–99)
Potassium: 4.4 mmol/L (ref 3.5–5.1)
Potassium: 4.5 mmol/L (ref 3.5–5.1)
Sodium: 135 mmol/L (ref 135–145)
Sodium: 137 mmol/L (ref 135–145)

## 2022-02-13 LAB — LACTIC ACID, PLASMA
Lactic Acid, Venous: 2.4 mmol/L (ref 0.5–1.9)
Lactic Acid, Venous: 2.6 mmol/L (ref 0.5–1.9)

## 2022-02-13 LAB — BLOOD GAS, ARTERIAL
Acid-Base Excess: 7 mmol/L — ABNORMAL HIGH (ref 0.0–2.0)
Bicarbonate: 35.1 mmol/L — ABNORMAL HIGH (ref 20.0–28.0)
FIO2: 100 %
MECHVT: 570 mL
Mechanical Rate: 20
O2 Saturation: 95.7 %
PEEP: 15 cmH2O
Patient temperature: 37
pCO2 arterial: 65 mmHg — ABNORMAL HIGH (ref 32–48)
pH, Arterial: 7.34 — ABNORMAL LOW (ref 7.35–7.45)
pO2, Arterial: 74 mmHg — ABNORMAL LOW (ref 83–108)

## 2022-02-13 LAB — TRIGLYCERIDES: Triglycerides: 235 mg/dL — ABNORMAL HIGH (ref <150)

## 2022-02-13 LAB — PHOSPHORUS: Phosphorus: 6.3 mg/dL — ABNORMAL HIGH (ref 2.5–4.6)

## 2022-02-13 MED ORDER — DOCUSATE SODIUM 50 MG/5ML PO LIQD
100.0000 mg | Freq: Two times a day (BID) | ORAL | Status: DC
  Administered 2022-02-13 – 2022-02-17 (×8): 100 mg
  Filled 2022-02-13 (×8): qty 10

## 2022-02-13 MED ORDER — PANTOPRAZOLE 2 MG/ML SUSPENSION
40.0000 mg | Freq: Every day | ORAL | Status: DC
  Administered 2022-02-13 – 2022-02-26 (×14): 40 mg
  Filled 2022-02-13 (×14): qty 20

## 2022-02-13 MED ORDER — METOLAZONE 2.5 MG PO TABS
2.5000 mg | ORAL_TABLET | Freq: Once | ORAL | Status: AC
Start: 2022-02-13 — End: 2022-02-13
  Administered 2022-02-13: 2.5 mg
  Filled 2022-02-13: qty 1

## 2022-02-13 MED ORDER — INSULIN GLARGINE-YFGN 100 UNIT/ML ~~LOC~~ SOLN
26.0000 [IU] | Freq: Every day | SUBCUTANEOUS | Status: DC
  Administered 2022-02-13 – 2022-02-19 (×7): 26 [IU] via SUBCUTANEOUS
  Filled 2022-02-13 (×8): qty 0.26

## 2022-02-13 MED ORDER — VITAL 1.5 CAL PO LIQD
1000.0000 mL | ORAL | Status: DC
  Administered 2022-02-13: 1000 mL

## 2022-02-13 MED ORDER — RENA-VITE PO TABS
1.0000 | ORAL_TABLET | Freq: Every day | ORAL | Status: DC
  Administered 2022-02-13 – 2022-02-16 (×4): 1
  Filled 2022-02-13 (×4): qty 1

## 2022-02-13 MED ORDER — OXYCODONE HCL 5 MG PO TABS
10.0000 mg | ORAL_TABLET | Freq: Four times a day (QID) | ORAL | Status: DC
  Administered 2022-02-13 – 2022-02-28 (×58): 10 mg
  Filled 2022-02-13 (×59): qty 2

## 2022-02-13 MED ORDER — VANCOMYCIN HCL 1500 MG/300ML IV SOLN
1500.0000 mg | Freq: Once | INTRAVENOUS | Status: AC
  Administered 2022-02-14: 1500 mg via INTRAVENOUS
  Filled 2022-02-13: qty 300

## 2022-02-13 MED ORDER — FUROSEMIDE 10 MG/ML IJ SOLN
4.0000 mg/h | INTRAMUSCULAR | Status: DC
  Administered 2022-02-13 – 2022-02-15 (×2): 4 mg/h via INTRAVENOUS
  Filled 2022-02-13 (×3): qty 20

## 2022-02-13 MED ORDER — METHYLPREDNISOLONE SODIUM SUCC 125 MG IJ SOLR
60.0000 mg | Freq: Two times a day (BID) | INTRAMUSCULAR | Status: DC
  Administered 2022-02-13 – 2022-02-15 (×4): 60 mg via INTRAVENOUS
  Filled 2022-02-13 (×4): qty 2

## 2022-02-13 MED ORDER — VANCOMYCIN VARIABLE DOSE PER UNSTABLE RENAL FUNCTION (PHARMACIST DOSING): Status: DC

## 2022-02-13 MED ORDER — MAGNESIUM SULFATE 2 GM/50ML IV SOLN
2.0000 g | Freq: Once | INTRAVENOUS | Status: AC
  Administered 2022-02-13: 2 g via INTRAVENOUS
  Filled 2022-02-13: qty 50

## 2022-02-13 MED ORDER — VANCOMYCIN HCL IN DEXTROSE 1-5 GM/200ML-% IV SOLN
1000.0000 mg | Freq: Once | INTRAVENOUS | Status: AC
  Administered 2022-02-13: 1000 mg via INTRAVENOUS
  Filled 2022-02-13: qty 200

## 2022-02-13 MED ORDER — SODIUM CHLORIDE 0.9 % IV SOLN
2.0000 g | Freq: Three times a day (TID) | INTRAVENOUS | Status: DC
  Administered 2022-02-13 – 2022-02-17 (×11): 2 g via INTRAVENOUS
  Filled 2022-02-13 (×5): qty 2
  Filled 2022-02-13: qty 12.5
  Filled 2022-02-13: qty 2
  Filled 2022-02-13: qty 12.5
  Filled 2022-02-13 (×4): qty 2

## 2022-02-13 NOTE — Consult Note (Signed)
[facility]  CONSULT NOTE    Date: 02/13/2022                  Patient Name:  Samuel Chavez  MRN: [id number]  DOB: 1976-03-21  Age / Sex: 46 y.o., male         PCP: [facility] [place]                 Service Requesting Consult: Critical care                 Reason for Consult: Acute kidney injury            History of Present Illness: [name] is a 46 y.o.  male with past medical history of diabetes, hypertension, sCHF, OSA, and obesity, who was admitted to [facility] on 02/11/2022 for Acute respiratory failure ([place]) [J96.00] Respiratory failure ([place]) [J96.90] Peripheral edema [R60.9] Acute respiratory failure with hypoxia and hypercapnia ([place]) [J96.01, J96.02]  Follow patient is seen and evaluated at bedside in ICU.  Patient has 2 daughters at bedside during this visit.  Patiently currently intubated and sedated.  Chart review conducted to obtain history.  Patient presented to the emergency department with increased lower extremity edema with shortness of breath.  Is recorded that he is continue taking torsemide but states he gained about 30 pounds.  Room air at baseline.  He was currently awaiting evaluation for obstructive sleep apnea.  Patient very somnolent and lethargic while in ED.  Decision made to intubate and place patient on ventilator.  Patient intubated on vent 100% FiO2, sedation on Fentanyl and propofol. No pressors. Tube feeds. Foley.   Labs on ED arrival include sodium 135, potassium 4.1, serum bicarb 34, glucose 319, calcium 8.8.  Chest x-ray shows cardiomegaly and pulmonary vascular congestion without overt pulmonary edema.  Creatinine within normal limits on admission however began to rise later that day and has continued to do so.   Medications: Outpatient medications: Medications Prior to Admission  Medication Sig Dispense Refill Last Dose   losartan (COZAAR) 100 MG tablet Take 100 mg by mouth daily.   Past Week   OZEMPIC,  0.25 OR 0.5 MG/DOSE, 2 MG/3ML SOPN SMARTSIG:0.25 Milligram(s) SUB-Q Once a Week   Past Week   torsemide (DEMADEX) 20 MG tablet Take 20 mg by mouth daily.   Past Week   Aspirin-Salicylamide-Caffeine (BC HEADACHE POWDER PO) Take 1 packet by mouth as needed (for pain).      cephALEXin (KEFLEX) 500 MG capsule Take 1 capsule (500 mg total) by mouth 4 (four) times daily. (Patient not taking: Reported on 02/11/2022) 20 capsule 0 Not Taking   cyclobenzaprine (FLEXERIL) 10 MG tablet Take 0.5-1 tablets (5-10 mg total) by mouth 2 (two) times daily as needed for muscle spasms. (Patient not taking: Reported on 02/11/2022) 20 tablet 0 Not Taking   gabapentin (NEURONTIN) 600 MG tablet Take 600 mg by mouth at bedtime. (Patient not taking: Reported on 02/11/2022)   Not Taking   LABETALOL HCL PO Take by mouth. (Patient not taking: Reported on 02/11/2022)   Not Taking   meloxicam (MOBIC) 15 MG tablet Take 1 tablet (15 mg total) by mouth daily. Take 1 daily with food. (Patient not taking: Reported on 02/11/2022) 10 tablet 0 Not Taking   metFORMIN (GLUCOPHAGE) 500 MG tablet Take 500 mg by mouth 2 (two) times daily with a meal. (Patient not taking: Reported on 02/11/2022)   Not Taking   METFORMIN HCL ER,  MOD, PO Take by mouth. (Patient not taking: Reported on 02/11/2022)   Not Taking   METOPROLOL SUCCINATE ER PO Take by mouth. (Patient not taking: Reported on 02/11/2022)   Not Taking   oxyCODONE-acetaminophen (PERCOCET) 10-325 MG tablet Take 1 tablet by mouth See admin instructions. Take 1 tablet by mouth 4-5 times a day as needed for pain (Patient not taking: Reported on 02/11/2022)   Not Taking   oxyCODONE-acetaminophen (PERCOCET/ROXICET) 5-325 MG tablet Take 1 tablet by mouth every 8 (eight) hours as needed for severe pain. (Patient not taking: Reported on 02/11/2022) 4 tablet 0 Not Taking   predniSONE (DELTASONE) 20 MG tablet Take 2 tablets (40 mg total) by mouth daily. (Patient not taking: Reported on 02/11/2022) 6 tablet 0 Not  Taking    Current medications: Current Facility-Administered Medications  Medication Dose Route Frequency Provider Last Rate Last Admin   0.9 %  sodium chloride infusion  250 mL Intravenous PRN Kasa, Kurian, MD       0.9 %  sodium chloride infusion  250 mL Intravenous Continuous Lang Snow, NP   Held at 02/11/22 2011   acetaminophen (TYLENOL) tablet 650 mg  650 mg Oral Q6H PRN Mansy, Jan A, MD       Or   acetaminophen (TYLENOL) suppository 650 mg  650 mg Rectal Q6H PRN Mansy, Jan A, MD       budesonide (PULMICORT) nebulizer solution 0.5 mg  0.5 mg Nebulization BID Flora Lipps, MD   0.5 mg at 02/13/22 0753   Chlorhexidine Gluconate Cloth 2 % PADS 6 each  6 each Topical [id number] Flora Lipps, MD   6 each at 02/13/22 0524   cyclobenzaprine (FLEXERIL) tablet 5-10 mg  5-10 mg Oral BID PRN Mansy, Jan A, MD       diazepam (VALIUM) tablet 10 mg  10 mg Per Tube Q6H Flora Lipps, MD   10 mg at 02/13/22 1419   docusate sodium (COLACE) capsule 100 mg  100 mg Oral BID PRN Flora Lipps, MD       docusate sodium (COLACE) capsule 100 mg  100 mg Oral BID Kasa, Kurian, MD   100 mg at 02/13/22 1118   enoxaparin (LOVENOX) injection 85 mg  0.5 mg/kg Subcutaneous Q24H Benita Gutter, RPH   85 mg at 02/12/22 2212   feeding supplement (PROSource TF) liquid 90 mL  90 mL Per Tube 5 X Daily Kasa, Maretta Bees, MD   90 mL at 02/13/22 1419   feeding supplement (VITAL 1.5 CAL) liquid 1,000 mL  1,000 mL Per Tube Q24H Flora Lipps, MD   Infusion Verify at 02/13/22 1700   fentaNYL (SUBLIMAZE) bolus via infusion 50-100 mcg  50-100 mcg Intravenous Q15 min PRN Flora Lipps, MD   100 mcg at 02/12/22 2343   fentaNYL 2580mg in NS 2511m(1058mml) infusion-PREMIX  50-200 mcg/hr Intravenous Continuous KasFlora LippsD 20 mL/hr at 02/13/22 1700 200 mcg/hr at 02/13/22 1700   free water 30 mL  30 mL Per Tube Q4H KasFlora LippsD   30 mL at 02/13/22 1635   furosemide (LASIX) 200 mg in dextrose 5 % 100 mL (2 mg/mL) infusion  4  mg/hr Intravenous Continuous KasFlora LippsD 2 mL/hr at 02/13/22 1700 4 mg/hr at 02/13/22 1700   insulin aspart (novoLOG) injection 0-20 Units  0-20 Units Subcutaneous Q4H OumLang SnowP   11 Units at 02/13/22 1634   insulin glargine-yfgn (SEMGLEE) injection 26 Units  26 Units Subcutaneous Daily KasFlora LippsD  26 Units at 02/13/22 1117   insulin starter kit- pen needles (English) 1 kit  1 kit Other Once Flora Lipps, MD       ipratropium-albuterol (DUONEB) 0.5-2.5 (3) MG/3ML nebulizer solution 3 mL  3 mL Nebulization Q6H Flora Lipps, MD   3 mL at 02/13/22 1457   ketamine (KETALAR) 2,500 mg in sodium chloride 0.9 % 500 mL (5 mg/mL) infusion  0.5-3 mg/kg/hr (Adjusted) Intravenous Continuous Flora Lipps, MD 33.2 mL/hr at 02/13/22 1700 1.5 mg/kg/hr at 02/13/22 1700   living well with diabetes book MISC   Does not apply Once Flora Lipps, MD       LORazepam (ATIVAN) injection 2-4 mg  2-4 mg Intravenous Q1H PRN Flora Lipps, MD   2 mg at 02/13/22 1341   magnesium hydroxide (MILK OF MAGNESIA) suspension 30 mL  30 mL Oral Daily PRN Mansy, Jan A, MD       methylPREDNISolone sodium succinate (SOLU-MEDROL) 40 mg/mL injection 40 mg  40 mg Intravenous Q12H Flora Lipps, MD   40 mg at 02/13/22 0735   midazolam (VERSED) 100 mg/100 mL (1 mg/mL) premix infusion  0.5-10 mg/hr Intravenous Continuous Flora Lipps, MD 10 mL/hr at 02/13/22 1700 10 mg/hr at 02/13/22 1700   multivitamin (RENA-VIT) tablet 1 tablet  1 tablet Per Tube QHS Flora Lipps, MD       ondansetron (ZOFRAN) tablet 4 mg  4 mg Oral Q6H PRN Mansy, Jan A, MD       Or   ondansetron [facility]) injection 4 mg  4 mg Intravenous Q6H PRN Mansy, Arvella Merles, MD       Oral care mouth rinse  15 mL Mouth Rinse Q2H Flora Lipps, MD   15 mL at 02/13/22 1634   Oral care mouth rinse  15 mL Mouth Rinse PRN Flora Lipps, MD       oxyCODONE (Oxy IR/ROXICODONE) immediate release tablet 10 mg  10 mg Per Tube Q6H Flora Lipps, MD   10 mg at 02/13/22 1419    oxyCODONE-acetaminophen (PERCOCET/ROXICET) 5-325 MG per tablet 1 tablet  1 tablet Oral Q8H PRN Mansy, Jan A, MD       pantoprazole sodium (PROTONIX) 40 mg/20 mL oral suspension 40 mg  40 mg Per Tube QHS Chappell, Alex B, RPH       polyethylene glycol (MIRALAX / GLYCOLAX) packet 17 g  17 g Oral Daily PRN Flora Lipps, MD       polyethylene glycol (MIRALAX / GLYCOLAX) packet 17 g  17 g Oral Daily Flora Lipps, MD   17 g at 02/13/22 1109   sodium chloride flush (NS) 0.9 % injection 10-40 mL  10-40 mL Intracatheter Q12H Flora Lipps, MD   10 mL at 02/13/22 1000   sodium chloride flush (NS) 0.9 % injection 10-40 mL  10-40 mL Intracatheter PRN Flora Lipps, MD       sodium chloride flush (NS) 0.9 % injection 3 mL  3 mL Intravenous Q12H Flora Lipps, MD   3 mL at 02/13/22 1000   sodium chloride flush (NS) 0.9 % injection 3 mL  3 mL Intravenous PRN Flora Lipps, MD       traZODone (DESYREL) tablet 25 mg  25 mg Oral QHS PRN Mansy, Jan A, MD       vecuronium (NORCURON) injection 20 mg  20 mg Intravenous Q1H PRN Flora Lipps, MD   20 mg at 02/13/22 1346      Allergies: Allergies  Allergen Reactions   Hydrocodone Swelling  Past Medical History: Past Medical History:  Diagnosis Date   Diabetes mellitus without complication ([place])    Hypertension    Pancreatitis      Past Surgical History: Past Surgical History:  Procedure Laterality Date   CHOLECYSTECTOMY       Family History: No family history on file.   Social History: Social History   Socioeconomic History   Marital status: Single    Spouse name: Not on file   Number of children: Not on file   Years of education: Not on file   Highest education level: Not on file  Occupational History   Not on file  Tobacco Use   Smoking status: Every Day    Packs/day: 1.00    Types: Cigarettes   Smokeless tobacco: Never  Substance and Sexual Activity   Alcohol use: No   Drug use: No   Sexual activity: Not on file  Other Topics  Concern   Not on file  Social History Narrative   Not on file   Social Determinants of Health   Financial Resource Strain: Not on file  Food Insecurity: Not on file  Transportation Needs: Not on file  Physical Activity: Not on file  Stress: Not on file  Social Connections: Not on file  Intimate Partner Violence: Not on file     Review of Systems: Review of Systems  Unable to perform ROS: Intubated    Vital Signs: Blood pressure 127/66, pulse (!) 101, temperature 98.7 F (37.1 C), temperature source Axillary, resp. rate 20, height 5' 9.02" (1.753 m), weight (!) 203 kg, SpO2 90 %.  Weight trends: Filed Weights   02/11/22 1740 02/12/22 0322 02/13/22 0422  Weight: (!) 170.9 kg (!) 170.9 kg (!) 203 kg    Physical Exam: General: Critically ill-appearing  Head: Normocephalic, atraumatic.   Eyes: Anicteric  Neck: Supple  Lungs:  Intubated on vent  Heart: Regular rate and rhythm  Abdomen:  Soft, nontender, obese  Extremities: No peripheral edema.  Neurologic: Nonfocal, moving all four extremities  Skin: No lesions  Access: None     Lab results: Basic Metabolic Panel: Recent Labs  Lab 02/11/22 2218 02/12/22 0254 02/13/22 0411 02/13/22 1625  NA 133* 132* 135 137  K 4.6 4.5 4.4 4.5  CL 92* 91* 94* 97*  CO2 _0 33*  GLUCOSE 299* 335* 273* 296*  BUN 14 17 36* 43*  CREATININE 1.32* 1.44* 1.68* 1.51*  CALCIUM 8.3* 8.3* 8.2* 8.2*  MG 1.6*  --  2.1  --   PHOS  --   --  6.3*  --     Liver Function Tests: Recent Labs  Lab 02/11/22 2218  AST 23  ALT 20  ALKPHOS 69  BILITOT 0.8  PROT 7.2  ALBUMIN 3.5   No results for input(s): "LIPASE", "AMYLASE" in the last 168 hours. No results for input(s): "AMMONIA" in the last 168 hours.  CBC: Recent Labs  Lab 02/11/22 1029 02/12/22 0254 02/13/22 0411  WBC 7.7 11.1* 16.3*  HGB 13.2 12.8* 11.7*  HCT 44.1 42.5 39.2  MCV 89.3 88.9 88.7  PLT 198 229 197    Cardiac Enzymes: No results for input(s):  "CKTOTAL", "CKMB", "CKMBINDEX", "TROPONINI" in the last 168 hours.  BNP: Invalid input(s): "POCBNP"  CBG: Recent Labs  Lab 02/12/22 2356 02/13/22 0407 02/13/22 0720 02/13/22 1109 02/13/22 1605  GLUCAP 244* 261* 257* 243* 296*    Microbiology: Results for orders placed or performed during the hospital encounter of 02/11/22  SARS Coronavirus 2 by RT PCR (hospital order, performed in [facility] hospital lab) *cepheid single result test* Anterior Nasal Swab     Status: None   Collection Time: 02/11/22  4:33 PM   Specimen: Anterior Nasal Swab  Result Value Ref Range Status   SARS Coronavirus 2 by RT PCR NEGATIVE NEGATIVE Final    Comment: (NOTE) SARS-CoV-2 target nucleic acids are NOT DETECTED.  The SARS-CoV-2 RNA is generally detectable in upper and lower respiratory specimens during the acute phase of infection. The lowest concentration of SARS-CoV-2 viral copies this assay can detect is 250 copies / mL. A negative result does not preclude SARS-CoV-2 infection and should not be used as the sole basis for treatment or other patient management decisions.  A negative result may occur with improper specimen collection / handling, submission of specimen other than nasopharyngeal swab, presence of viral mutation(s) within the areas targeted by this assay, and inadequate number of viral copies (<250 copies / mL). A negative result must be combined with clinical observations, patient history, and epidemiological information.  Fact Sheet for Patients:   https://www.patel.info/  Fact Sheet for Healthcare Providers: https://hall.com/  This test is not yet approved or  cleared by the [place] FDA and has been authorized for detection and/or diagnosis of SARS-CoV-2 by FDA under an Emergency Use Authorization (EUA).  This EUA will remain in effect (meaning this test can be used) for the duration of the COVID-19 declaration under Section  564(b)(1) of the Act, 21 U.S.C. section 360bbb-3(b)(1), unless the authorization is terminated or revoked sooner.  Performed at [facility], [address] [place], [place] [address]   MRSA Next Gen by PCR, Nasal     Status: None   Collection Time: 02/11/22  5:48 PM   Specimen: Nasal Mucosa; Nasal Swab  Result Value Ref Range Status   MRSA by PCR Next Gen NOT DETECTED NOT DETECTED Final    Comment: (NOTE) The GeneXpert MRSA Assay (FDA approved for NASAL specimens only), is one component of a comprehensive MRSA colonization surveillance program. It is not intended to diagnose MRSA infection nor to guide or monitor treatment for MRSA infections. Test performance is not FDA approved in patients less than 45 years old. Performed at [facility], [address] [place], [place] [address]     Coagulation Studies: No results for input(s): "LABPROT", "INR" in the last 72 hours.  Urinalysis: No results for input(s): "COLORURINE", "LABSPEC", "PHURINE", "GLUCOSEU", "HGBUR", "BILIRUBINUR", "KETONESUR", "PROTEINUR", "UROBILINOGEN", "NITRITE", "LEUKOCYTESUR" in the last 72 hours.  Invalid input(s): "APPERANCEUR"    Imaging: DG Chest Port 1 View  Result Date: 02/13/2022 CLINICAL DATA:  Acute on chronic respiratory failure. EXAM: PORTABLE CHEST 1 VIEW COMPARISON:  Portable chest yesterday at 7:40 a.m. FINDINGS: 5:15 a.m. ETT tip is 4.5 cm from the carina. NGT is not well seen in the stomach due to body habitus but tip is probably in the body of the stomach. A right PICC has been inserted. The tip is at the level of the superior cavoatrial junction. Moderate to severe cardiomegaly.  Stable mediastinal configuration. There is mild central vascular prominence without overt edema, with small to moderate-sized pleural effusions and opacities in the lower lung fields which could be due to atelectasis or consolidation. There are perihilar atelectatic bands which appears similar.  The upper lung fields are otherwise clear. No pneumothorax. IMPRESSION: 1. Support tubes as above, with interval right PICC with tip at the superior cavoatrial junction. 2. Cardiomegaly and  mild central vascular fullness without visible edema, small to moderate-sized pleural effusions, and stable overlying opacities in the lung bases. Electronically Signed   By: Telford Nab M.D.   On: 02/13/2022 06:59   ECHOCARDIOGRAM COMPLETE  Result Date: 02/12/2022    ECHOCARDIOGRAM REPORT   Patient Name:   Samuel Chavez Date of Exam: 02/11/2022 Medical Rec #:  [id number]        Height:       69.0 in Accession #:    [id number]       Weight:       376.8 lb Date of Birth:  04-12-1976        BSA:          2.704 m Patient Age:    46 years         BP:           144/78 mmHg Patient Gender: M                HR:           94 bpm. Exam Location:  [facility] Procedure: 2D Echo, Cardiac Doppler, Color Doppler and Intracardiac            Opacification Agent Indications:     D78.24 Acute diastolic CHF  History:         Patient has no prior history of Echocardiogram examinations.                  Risk Factors:Current Smoker, Sleep Apnea, Diabetes and                  Hypertension.  Sonographer:     Cresenciano Lick RDCS Referring Phys:  [id number] Arvella Merles MANSY Diagnosing Phys: Nelva Bush MD IMPRESSIONS  1. Left ventricular ejection fraction, by estimation, is 55 to 60%. The left ventricle has normal function. Left ventricular endocardial border not optimally defined to evaluate regional wall motion. The left ventricular internal cavity size was mildly dilated. There is moderate left ventricular hypertrophy. Left ventricular diastolic parameters are consistent with Grade I diastolic dysfunction (impaired relaxation).  2. Right ventricular systolic function is normal. The right ventricular size is mildly enlarged. Tricuspid regurgitation signal is inadequate for assessing PA pressure.  3. Left atrial size was mild to moderately dilated.   4. Right atrial size was moderately dilated.  5. The mitral valve is abnormal. Trivial mitral valve regurgitation. No evidence of mitral stenosis.  6. The aortic valve has an indeterminant number of cusps. Aortic valve regurgitation is not visualized. No aortic stenosis is present.  7. Aortic dilatation noted. There is borderline dilatation of the aortic root, measuring 39 mm. There is mild dilatation of the ascending aorta, measuring 39 mm. FINDINGS  Left Ventricle: Left ventricular ejection fraction, by estimation, is 55 to 60%. The left ventricle has normal function. Left ventricular endocardial border not optimally defined to evaluate regional wall motion. Definity contrast agent was given IV to delineate the left ventricular endocardial borders. The left ventricular internal cavity size was mildly dilated. There is moderate left ventricular hypertrophy. Left ventricular diastolic parameters are consistent with Grade I diastolic dysfunction (impaired relaxation). Right Ventricle: The right ventricular size is mildly enlarged. No increase in right ventricular wall thickness. Right ventricular systolic function is normal. Tricuspid regurgitation signal is inadequate for assessing PA pressure. Left Atrium: Left atrial size was mild to moderately dilated. Right Atrium: Right atrial size was moderately dilated. Pericardium: The pericardium was not well visualized. Mitral Valve: The  mitral valve is abnormal. There is mild thickening of the mitral valve leaflet(s). Trivial mitral valve regurgitation. No evidence of mitral valve stenosis. Tricuspid Valve: The tricuspid valve is not well visualized. Tricuspid valve regurgitation is not demonstrated. Aortic Valve: The aortic valve has an indeterminant number of cusps. Aortic valve regurgitation is not visualized. No aortic stenosis is present. Pulmonic Valve: The pulmonic valve was not well visualized. Pulmonic valve regurgitation is not visualized. No evidence of  pulmonic stenosis. Aorta: Aortic dilatation noted. There is borderline dilatation of the aortic root, measuring 39 mm. There is mild dilatation of the ascending aorta, measuring 39 mm. Pulmonary Artery: The pulmonary artery is not well seen. Venous: The inferior vena cava was not well visualized. IAS/Shunts: The interatrial septum was not well visualized.  LEFT VENTRICLE PLAX 2D LVIDd:         5.80 cm   Diastology LVIDs:         3.90 cm   LV e' medial:    8.70 cm/s LV PW:         1.40 cm   LV E/e' medial:  8.4 LV IVS:        1.40 cm   LV e' lateral:   7.18 cm/s LVOT diam:     2.40 cm   LV E/e' lateral: 10.2 LV SV:         77 LV SV Index:   29 LVOT Area:     4.52 cm  RIGHT VENTRICLE RV Basal diam:  5.90 cm RV S prime:     13.60 cm/s TAPSE (M-mode): 3.5 cm LEFT ATRIUM              Index        RIGHT ATRIUM           Index LA diam:        5.30 cm  1.96 cm/m   RA Area:     28.40 cm LA Vol (A2C):   116.0 ml 42.90 ml/m  RA Volume:   113.00 ml 41.79 ml/m LA Vol (A4C):   113.0 ml 41.79 ml/m LA Biplane Vol: 117.0 ml 43.27 ml/m  AORTIC VALVE LVOT Vmax:   106.00 cm/s LVOT Vmean:  88.000 cm/s LVOT VTI:    0.170 m  AORTA Ao Root diam: 3.90 cm Ao Asc diam:  3.90 cm MITRAL VALVE MV Area (PHT): 3.37 cm    SHUNTS MV Decel Time: 225 msec    Systemic VTI:  0.17 m MV E velocity: 73.30 cm/s  Systemic Diam: 2.40 cm MV A velocity: 84.00 cm/s MV E/A ratio:  0.87 Harrell Gave End MD Electronically signed by Nelva Bush MD Signature Date/Time: 02/12/2022/11:15:50 AM    Final    [place] EKG SITE RITE  Result Date: 02/12/2022 If Site Rite image not attached, placement could not be confirmed due to current cardiac rhythm.  DG Chest Port 1 View  Result Date: 02/12/2022 CLINICAL DATA:  46 year old male with respiratory failure. EXAM: PORTABLE CHEST 1 VIEW COMPARISON:  Portable chest 02/11/2022 and earlier. FINDINGS: Portable AP semi upright view at 0740 hours. Intubated. Endotracheal tube tip in satisfactory position just below the  clavicles. Enteric tube courses to the abdomen, tip not included. Continued somewhat low lung volumes. There is cardiomegaly. Other mediastinal contours are within normal limits. Streaky and confluent bilateral lung base opacity. No pneumothorax. Pulmonary vascularity appears to remain normal. Streaky, more platelike perihilar opacity. No definite pleural effusion. Ventilation not significantly changed. No acute osseous abnormality identified. IMPRESSION: 1.  Stable lines and tubes. 2. Stable ventilation from yesterday. Low lung volumes with cardiomegaly, bilateral lung base collapse or consolidation, and perihilar atelectasis. Electronically Signed   By: Genevie Ann M.D.   On: 02/12/2022 07:49   DG Abd 1 View  Result Date: 02/11/2022 CLINICAL DATA:  Impaired nasogastric feeding tube, subsequent encounter. Endotracheal tube present. EXAM: ABDOMEN - 1 VIEW COMPARISON:  Chest and abdominal radiographs done earlier the same date. FINDINGS: 2121 hours. Mild motion artifact. Tip of the enteric tube is seen in the left subphrenic area, consistent with position in the proximal to mid stomach. Side hole appears below the gastroesophageal junction. The visualized bowel gas pattern is nonobstructive. Cholecystectomy clips are noted. IMPRESSION: Interval advancement of the enteric tube to the level of the proximal to mid stomach. Electronically Signed   By: Richardean Sale M.D.   On: 02/11/2022 21:39   DG Chest 1 View  Result Date: 02/11/2022 CLINICAL DATA:  Impaired nasogastric feeding tube, subsequent encounter. Endotracheal tube present. EXAM: CHEST  1 VIEW COMPARISON:  Chest radiographs earlier the same date. Chest CT 06/04/2020. FINDINGS: 2108 hours. Tip of the endotracheal tube projects over the mid trachea. The enteric tube is not well seen within the lower chest or upper abdomen on this view. See separate abdominal examination. Stable cardiomegaly with low lung volumes and probable bibasilar atelectasis. No  evidence of pneumothorax or significant pleural effusion. No acute osseous findings are evident. IMPRESSION: 1. Satisfactory position of the endotracheal tube. 2. Inferior aspect of the enteric tube is not well seen on this examination. See separate abdominal radiograph report. 3. Similar low lung volumes with bibasilar atelectasis. Electronically Signed   By: Richardean Sale M.D.   On: 02/11/2022 21:37     Assessment & Plan: Mr. TYMOTHY CASS is a 46 y.o.  male with past medical history of diabetes, hypertension, sCHF, OSA, and obesity, who was admitted to [facility] on 02/11/2022 for Acute respiratory failure ([place]) [J96.00] Respiratory failure ([place]) [J96.90] Peripheral edema [R60.9] Acute respiratory failure with hypoxia and hypercapnia ([place]) [J96.01, J96.02]   Respiratory distress with acute kidney injury likely due to cardiorenal syndrome with decreased blood pressure and perfusion.No IV contrast exposure. Patient intubated on vent due to hypercapnia. Blood pressure variations vs increased fluid volume may have caused kidney injury. No acute need for dialysis at this time. Will continue to monitor renal function. Avoid nephrotoxic agents and therapies. Avoid hypotension.   2.  Combined systolic and diastolic heart failure.  Echo from 02/11/2022 shows EF 55 to 60% with a mildly enlarged left ventricular cavity, moderate LVH, and grade 1 diastolic dysfunction.  Right ventricle mildly enlarged with tricuspid regurgitation.  No aortic stenosis, mild dilation of ascending aorta.   LOS: 2 Colon Flattery 7/27/20235:46 PM

## 2022-02-13 NOTE — Consult Note (Signed)
PHARMACY CONSULT NOTE  Pharmacy Consult for Electrolyte Monitoring and Replacement   Recent Labs: Potassium (mmol/L)  Date Value  02/13/2022 4.4   Magnesium (mg/dL)  Date Value  02/13/2022 2.1   Calcium (mg/dL)  Date Value  02/13/2022 8.2 (L)   Albumin (g/dL)  Date Value  02/11/2022 3.5   Phosphorus (mg/dL)  Date Value  02/13/2022 6.3 (H)   Sodium (mmol/L)  Date Value  02/13/2022 135   Assessment: Patient is a 46 y/o M with medical history including DM, HTN, pancreatitis, tobacco use disorder, systolic CHF, OSA, DM c/b diabetic neuropathy, lumbar radiculopathy, morbid obesity who is admitted with acute respiratory failure in setting of acute CHF and hypertensive urgency. Patient is currently intubated, sedated, and on mechanical ventilation in the ICU. Pharmacy consulted to assist with electrolyte monitoring and replacement as indicated.  Nutrition: Trickle feeds started 7/26 Diuretics: IV Lasix 40 mg BID  Goal of Therapy:  Electrolytes within normal limits  Plan:  --No electrolyte replacement indicated at this time --Follow-up electrolytes with AM labs tomorrow  Benita Gutter 02/13/2022 7:44 AM

## 2022-02-13 NOTE — Progress Notes (Signed)
Nutrition Brief Follow Up Note   Propofol change   INTERVENTION:   Change to Vital 1.5 @45ml /hr + ProSource TF 33ml five times daily via tube  Free water flushes 5ml q4 hours to maintain tube patency   Regimen provides 2020kcal/day, 183g/day protein and 1024ml/day of free water   Rena-vit daily via tube   Estimated Nutritional Needs:   Kcal:  1600-1800kcal/day  Protein:  182g/day protein  Fluid:  2.0L/day  Koleen Distance MS, RD, LDN Please refer to [facility] for RD and/or RD on-call/weekend/after hours pager

## 2022-02-13 NOTE — Progress Notes (Signed)
NAME:  Samuel Chavez, MRN:  [id number], DOB:  1976-06-20, LOS: 2 ADMISSION DATE:  02/11/2022, CONSULTATION DATE: 02/11/22 REFERRING MD:  Eugenie Norrie MD  CHIEF COMPLAINT: SOB   CC  follow up RESP FAILURE/acute CHF exacerbation  HPI/SYNOPSIS  46 y.o male with significant PMH as below who presented to the ED with chief complaints of SOB and worsening bilateral lower extremities edema.  ED Course: In the emergency department, the temperature was 37.3C, the heart rate 99 beats/minute, the blood pressure 187/102 mm Hg, the respiratory rate 20 breaths/minute, and the oxygen saturation 89% on. He was noted to be drowsy but still protecting his airway and responding appropriately.  Pertinent Labs/Diagnostics Findings: Chemistry:Glucose:319 Calcium: 8.8 otherwise unremarkable CBC: unremarkable Other Lab findings:  COVID PCR: Negative, BNP: pending, Venous Blood Gas result:  pO2 pending; pCO2 87; pH 7.27;  HCO3 40, %O2 Sat 48.7.  Imaging: Chest x-ray showed cardiomegaly and pulmonary vascular congestion without overt pulmonary edema  Patient was placed on BiPAP for acute on chronic hypoxic hypercapnic respiratory failure.  He was treated with IV Lasix, Solu-Medrol, DuoNebs and admitted to hospitalist service.  Patient was initially improving on BiPAP however on reassessment he was noted to be minimally responsive with snoring respirations.  Due to concerns for worsening hypercapnia and impending respiratory failure he was intubated for airway protection.    Significant Hospital Events   7/25: Admitted to hospitalist service w/acute on chronic hypoxic hypercapnic resp. failure requiring BiPAP.Failed BiPAP and intubated. PCCM consulted 7/26 INTUBATED, difficlut to sedate, started KETAMINE INFUSION 7/27 severe hypoxia, PEEP increased to 15  Consults:  PCCM  Procedures:  7/25: Intubation  Significant Diagnostic Tests:  7/25: Chest Xray> Chest x-ray showed cardiomegaly and pulmonary vascular  congestion without overt pulmonary edema   Micro Data:  7/25: SARS-CoV-2 PCR> negative 7/25: MRSA PCR>> NEG  Antimicrobials:  None   EVENTS OVERNIGHT SEVERE HYPOXIA UNABLE TO WEAN FROM VENT DIFFICULT TO SEDATE PROGNOSIS IS POOR WILL NEED TRACH       OBJECTIVE  Blood pressure 114/66, pulse 93, temperature 98.7 F (37.1 C), temperature source Axillary, resp. rate 20, height 5' 9.02" (1.753 m), weight (!) 203 kg, SpO2 (!) 89 %. CVP:  [11 mmHg-21 mmHg] 21 mmHg  Vent Mode: PRVC FiO2 (%):  [100 %] 100 % Set Rate:  [20 bmp] 20 bmp Vt Set:  [570 mL] 570 mL PEEP:  [10 cmH20] 10 cmH20 Plateau Pressure:  [29 cmH20] 29 cmH20   Intake/Output Summary (Last 24 hours) at 02/13/2022 0733 Last data filed at 02/13/2022 0600 Gross per 24 hour  Intake 2048.32 ml  Output 2175 ml  Net -126.68 ml    Filed Weights   02/11/22 1740 02/12/22 0322 02/13/22 0422  Weight: (!) 170.9 kg (!) 170.9 kg (!) 203 kg    REVIEW OF SYSTEMS  PATIENT IS UNABLE TO PROVIDE COMPLETE REVIEW OF SYSTEMS DUE TO SEVERE CRITICAL ILLNESS    PHYSICAL EXAMINATION:  GENERAL:critically ill appearing, +resp distress EYES: Pupils equal, round, reactive to light.  No scleral icterus.  MOUTH: Moist mucosal membrane. INTUBATED NECK: Supple.  PULMONARY: +rhonchi, +wheezing CARDIOVASCULAR: S1 and S2.  No murmurs  GASTROINTESTINAL: Soft, nontender, -distended. Positive bowel sounds.  MUSCULOSKELETAL: No swelling, clubbing, or edema.  NEUROLOGIC: obtunded SKIN:intact,warm,dry     Labs/imaging that I havepersonally reviewed  (right click and "Reselect all SmartList Selections" daily)     Labs   CBC: Recent Labs  Lab 02/11/22 1029 02/12/22 0254 02/13/22 0411  WBC 7.7 11.1*  16.3*  HGB 13.2 12.8* 11.7*  HCT 44.1 42.5 39.2  MCV 89.3 88.9 88.7  PLT 198 229 197     Basic Metabolic Panel: Recent Labs  Lab 02/11/22 1029 02/11/22 2218 02/12/22 0254 02/13/22 0411  NA 135 133* 132* 135  K 4.1 4.6 4.5  4.4  CL 94* 92* 91* 94*  CO2 34* _0 GLUCOSE 319* 299* 335* 273*  BUN _1 36*  CREATININE 0.96 1.32* 1.44* 1.68*  CALCIUM 8.8* 8.3* 8.3* 8.2*  MG  --  1.6*  --  2.1  PHOS  --   --   --  6.3*    GFR: Estimated Creatinine Clearance: 98.1 mL/min (A) (by C-G formula based on SCr of 1.68 mg/dL (H)). Recent Labs  Lab 02/11/22 1029 02/11/22 2218 02/12/22 0051 02/12/22 0254 02/12/22 0602 02/13/22 0411  PROCALCITON  --  <0.10  --   --   --   --   WBC 7.7  --   --  11.1*  --  16.3*  LATICACIDVEN  --  4.0* 4.1*  --  3.4*  --      Liver Function Tests: Recent Labs  Lab 02/11/22 2218  AST 23  ALT 20  ALKPHOS 69  BILITOT 0.8  PROT 7.2  ALBUMIN 3.5    No results for input(s): "LIPASE", "AMYLASE" in the last 168 hours. No results for input(s): "AMMONIA" in the last 168 hours.  ABG    Component Value Date/Time   PHART 7.38 02/11/2022 2318   PCO2ART 59 (H) 02/11/2022 2318   PO2ART 83 02/11/2022 2318   HCO3 34.9 (H) 02/11/2022 2318   TCO2 26 03/17/2007 1822   O2SAT 97.8 02/11/2022 2318     Home Medications  Prior to Admission medications   Medication Sig Start Date End Date Taking? Authorizing Provider  losartan (COZAAR) 100 MG tablet Take 100 mg by mouth daily. 02/01/22  Yes [provider]  OZEMPIC, 0.25 OR 0.5 MG/DOSE, 2 MG/3ML SOPN SMARTSIG:0.25 Milligram(s) SUB-Q Once a Week 12/09/21  Yes [provider]  torsemide (DEMADEX) 20 MG tablet Take 20 mg by mouth daily. 12/18/21  Yes [provider]  Aspirin-Salicylamide-Caffeine (BC HEADACHE POWDER PO) Take 1 packet by mouth as needed (for pain).    [provider]  cephALEXin (KEFLEX) 500 MG capsule Take 1 capsule (500 mg total) by mouth 4 (four) times daily. Patient not taking: Reported on 02/11/2022 12/16/18   Davonna Belling, MD  cyclobenzaprine (FLEXERIL) 10 MG tablet Take 0.5-1 tablets (5-10 mg total) by mouth 2 (two) times daily as needed for muscle spasms. Patient not  taking: Reported on 02/11/2022 03/12/18   Margarita Mail, PA-C  gabapentin (NEURONTIN) 600 MG tablet Take 600 mg by mouth at bedtime. Patient not taking: Reported on 02/11/2022 10/13/21   [provider]  LABETALOL HCL PO Take by mouth. Patient not taking: Reported on 02/11/2022    [provider]  meloxicam (MOBIC) 15 MG tablet Take 1 tablet (15 mg total) by mouth daily. Take 1 daily with food. Patient not taking: Reported on 02/11/2022 03/12/18   Margarita Mail, PA-C  metFORMIN (GLUCOPHAGE) 500 MG tablet Take 500 mg by mouth 2 (two) times daily with a meal. Patient not taking: Reported on 02/11/2022    [provider]  METFORMIN HCL ER, MOD, PO Take by mouth. Patient not taking: Reported on 02/11/2022    [provider]  METOPROLOL SUCCINATE ER PO Take by mouth. Patient not taking: Reported on  02/11/2022    [provider]  oxyCODONE-acetaminophen (PERCOCET) 10-325 MG tablet Take 1 tablet by mouth See admin instructions. Take 1 tablet by mouth 4-5 times a day as needed for pain Patient not taking: Reported on 02/11/2022    [provider]  oxyCODONE-acetaminophen (PERCOCET/ROXICET) 5-325 MG tablet Take 1 tablet by mouth every 8 (eight) hours as needed for severe pain. Patient not taking: Reported on 02/11/2022 12/16/18   Davonna Belling, MD  predniSONE (DELTASONE) 20 MG tablet Take 2 tablets (40 mg total) by mouth daily. Patient not taking: Reported on 02/11/2022 04/04/15   Davonna Belling, MD   Scheduled Meds:  budesonide (PULMICORT) nebulizer solution  0.5 mg Nebulization BID   Chlorhexidine Gluconate Cloth  6 each Topical Q0600   diazepam  10 mg Per Tube Q6H   docusate sodium  100 mg Oral BID   enoxaparin (LOVENOX) injection  0.5 mg/kg Subcutaneous Q24H   feeding supplement (PROSource TF)  90 mL Per Tube 5 X Daily   feeding supplement (VITAL HIGH PROTEIN)  1,000 mL Per Tube Q24H   free water  30 mL Per Tube Q4H   furosemide  40 mg  Intravenous Q12H   insulin aspart  0-20 Units Subcutaneous Q4H   insulin glargine-yfgn  10 Units Subcutaneous Daily   insulin starter kit- pen needles  1 kit Other Once   ipratropium-albuterol  3 mL Nebulization Q6H   living well with diabetes book   Does not apply Once   methylPREDNISolone (SOLU-MEDROL) injection  40 mg Intravenous Q12H   multivitamin  15 mL Per Tube Daily   mouth rinse  15 mL Mouth Rinse Q2H   oxyCODONE  5 mg Per Tube Q6H   pantoprazole sodium  40 mg Per Tube QHS   polyethylene glycol  17 g Oral Daily   sodium chloride flush  10-40 mL Intracatheter Q12H   sodium chloride flush  3 mL Intravenous Q12H   Continuous Infusions:  sodium chloride     sodium chloride Stopped (02/11/22 2011)   fentaNYL infusion INTRAVENOUS 200 mcg/hr (02/13/22 0720)   ketamine (KETALAR) 2,500 mg in sodium chloride 0.9 % 500 mL (5 mg/mL) infusion 1.2 mg/kg/hr (02/13/22 0600)   midazolam 10 mg/hr (02/13/22 0600)   norepinephrine (LEVOPHED) Adult infusion Stopped (02/12/22 1437)   PRN Meds:.sodium chloride, acetaminophen **OR** acetaminophen, cyclobenzaprine, docusate sodium, fentaNYL, LORazepam, magnesium hydroxide, ondansetron **OR** ondansetron (ZOFRAN) IV, mouth rinse, oxyCODONE-acetaminophen, polyethylene glycol, sodium chloride flush, sodium chloride flush, traZODone, vecuronium  Active Hospital Problem list     Assessment & Plan:   46 yo morbidly obese AAM with severe decompensated acute systolic and diastolic heart failure with high likelihood of underlying OSA/OHS active smoker leading to severe hypoxic resp failure with encephalopathy and difficulty with sedation  Severe ACUTE Hypoxic and Hypercapnic Respiratory Failure -continue Mechanical Ventilator support -Wean Fio2 and PEEP as tolerated -VAP/VENT bundle implementation - Wean PEEP & FiO2 as tolerated, maintain SpO2 > 88% - Head of bed elevated 30 degrees, VAP protocol in place - Plateau pressures less than 30 cm H20  -  Intermittent chest x-ray & ABG PRN - Ensure adequate pulmonary hygiene  -will NOT perform SAT/SBT    Vent Mode: PRVC FiO2 (%):  [100 %] 100 % Set Rate:  [20 bmp] 20 bmp Vt Set:  [570 mL] 570 mL PEEP:  [10 cmH20] 10 cmH20 Plateau Pressure:  [29 cmH20] 29 LFY10   ACUTE SYSTOLIC CARDIAC FAILURE- EF 35% VENT SUPPORT HOLD LASIX CREATINE ELEVATED -Hold metoprolol and  Losartan due to sedation related hypotension -Repeat 2D Echocardiogram   ACUTE KIDNEY INJURY/Renal Failure PROGRESSIVE CARDIORENAL SYDNROME -continue Foley Catheter-assess need -Avoid nephrotoxic agents -Follow urine output, BMP -Ensure adequate renal perfusion, optimize oxygenation -Renal dose medications May need HD/CRRT   Intake/Output Summary (Last 24 hours) at 02/13/2022 0737 Last data filed at 02/13/2022 0600 Gross per 24 hour  Intake 2048.32 ml  Output 2175 ml  Net -126.68 ml       Latest Ref Rng & Units 02/13/2022    4:11 AM 02/12/2022    2:54 AM 02/11/2022   10:18 PM  BMP  Glucose 70 - 99 mg/dL 273  335  299   BUN 6 - 20 mg/dL 36  17  14   Creatinine 0.61 - 1.24 mg/dL 1.68  1.44  1.32   Sodium 135 - 145 mmol/L 135  132  133   Potassium 3.5 - 5.1 mmol/L 4.4  4.5  4.6   Chloride 98 - 111 mmol/L 94  91  92   CO2 22 - 32 mmol/L _0 Calcium 8.9 - 10.3 mg/dL 8.2  8.3  8.3     ENDO - ICU hypoglycemic\Hyperglycemia protocol -check FSBS per protocol Diabetes mellitus HgbA1c  10.1 -CBG's AC & hs; Target range of 140 to 180 -SSI -Follow ICU Hypo/Hyperglycemia protocol -Hold Metformin    GI GI PROPHYLAXIS as indicated  NUTRITIONAL STATUS DIET-->TF's as tolerated Constipation protocol as indicated  ELECTROLYTES -follow labs as needed -replace as needed -pharmacy consultation and following  ACUTE ANEMIA- TRANSFUSE AS NEEDED CONSIDER TRANSFUSION  IF HGB<7   AT THIS POINT, PATIENT WITH SEVERE DECOMPENSATED HEART FAILURE AND COPD ACTIVE SMOKER, WITH OSA/OHS WITH SEVERE HYPOXIA WITH  LARGE TONGUE AND DIFFICULT AIRWAY WITH SEVERE AGITATION, PATIENT WILL NEED TRACH TO SURVIVE    Best practice:  Diet:  NPO Pain/Anxiety/Delirium protocol (if indicated): Yes (RASS goal 0) VAP protocol (if indicated): Yes DVT prophylaxis: LMWH GI prophylaxis: PPI Glucose control:  SSI Yes Central venous access:  N/A Arterial line:  N/A Foley:  Yes, and it is still needed Mobility:  bed rest  PT consulted: N/A Last date of multidisciplinary goals of care discussion [7/25] Code Status:  full code Disposition: ICU    DVT/GI PRX  assessed I Assessed the need for Labs I Assessed the need for Foley I Assessed the need for Central Venous Line Family Discussion when available I Assessed the need for Mobilization I made an Assessment of medications to be adjusted accordingly Safety Risk assessment completed  CASE DISCUSSED IN MULTIDISCIPLINARY ROUNDS WITH ICU TEAM     Critical Care Time devoted to patient care services described in this note is 50 minutes.  Critical care was necessary to treat /prevent imminent and life-threatening deterioration. Overall, patient is critically ill, prognosis is guarded.  Patient with Multiorgan failure and at high risk for cardiac arrest and death.    Corrin Parker, M.D.  [facility]  Medical Director [place] Director [facility] Cardio-Pulmonary Department

## 2022-02-13 NOTE — Consult Note (Signed)
Pharmacy Antibiotic Note  Samuel Chavez is a 46 y.o. male admitted on 02/11/2022 with acute respiratory failure. Pharmacy has been consulted for cefepime and vancomycin dosing for PNA.  7/25 MRSA PCR negative   Plan: Initiate cefepime 2 gram Q8H Vancomycin 2500 mg LD x 1 Will withhold scheduling maintenance regimen at this time given changing renal function  Height: 5' 9.02" (175.3 cm) Weight: (!) 203 kg (447 lb 8.5 oz) IBW/kg (Calculated) : 70.74  Temp (24hrs), Avg:99.8 F (37.7 C), Min:98.6 F (37 C), Max:100.6 F (38.1 C)  Recent Labs  Lab 02/11/22 1029 02/11/22 2218 02/12/22 0051 02/12/22 0254 02/12/22 0602 02/13/22 0411 02/13/22 1146 02/13/22 1625  WBC 7.7  --   --  11.1*  --  16.3*  --   --   CREATININE 0.96 1.32*  --  1.44*  --  1.68*  --  1.51*  LATICACIDVEN  --  4.0* 4.1*  --  3.4*  --  2.4* 2.6*    Estimated Creatinine Clearance: 109.1 mL/min (A) (by C-G formula based on SCr of 1.51 mg/dL (H)).    Allergies  Allergen Reactions   Hydrocodone Swelling    Antimicrobials this admission: 7/27 cefepime >>  7/27 Vancomycin >>   Dose adjustments this admission:   Microbiology results: 7/27 Sputum: sent  7/25 MRSA PCR: neg  Thank you for allowing pharmacy to be a part of this patient's care.  Dorothe Pea, PharmD, BCPS Clinical Pharmacist   02/13/2022 8:13 PM

## 2022-02-14 DIAGNOSIS — J9602 Acute respiratory failure with hypercapnia: Secondary | ICD-10-CM | POA: Diagnosis not present

## 2022-02-14 DIAGNOSIS — J9601 Acute respiratory failure with hypoxia: Secondary | ICD-10-CM | POA: Diagnosis not present

## 2022-02-14 LAB — CBC
HCT: 36.3 % — ABNORMAL LOW (ref 39.0–52.0)
Hemoglobin: 10.9 g/dL — ABNORMAL LOW (ref 13.0–17.0)
MCH: 26.5 pg (ref 26.0–34.0)
MCHC: 30 g/dL (ref 30.0–36.0)
MCV: 88.3 fL (ref 80.0–100.0)
Platelets: 174 10*3/uL (ref 150–400)
RBC: 4.11 MIL/uL — ABNORMAL LOW (ref 4.22–5.81)
RDW: 15 % (ref 11.5–15.5)
WBC: 14.1 10*3/uL — ABNORMAL HIGH (ref 4.0–10.5)
nRBC: 0 % (ref 0.0–0.2)

## 2022-02-14 LAB — STREP PNEUMONIAE URINARY ANTIGEN: Strep Pneumo Urinary Antigen: NEGATIVE

## 2022-02-14 LAB — BASIC METABOLIC PANEL
Anion gap: 8 (ref 5–15)
BUN: 47 mg/dL — ABNORMAL HIGH (ref 6–20)
CO2: 33 mmol/L — ABNORMAL HIGH (ref 22–32)
Calcium: 8.2 mg/dL — ABNORMAL LOW (ref 8.9–10.3)
Chloride: 97 mmol/L — ABNORMAL LOW (ref 98–111)
Creatinine, Ser: 1.34 mg/dL — ABNORMAL HIGH (ref 0.61–1.24)
GFR, Estimated: >60 mL/min (ref >60)
Glucose, Bld: 316 mg/dL — ABNORMAL HIGH (ref 70–99)
Potassium: 4.3 mmol/L (ref 3.5–5.1)
Sodium: 138 mmol/L (ref 135–145)

## 2022-02-14 LAB — URINE DRUG SCREEN, QUALITATIVE (ARMC ONLY)
Amphetamines, Ur Screen: NOT DETECTED
Barbiturates, Ur Screen: NOT DETECTED
Benzodiazepine, Ur Scrn: POSITIVE — AB
Cannabinoid 50 Ng, Ur ~~LOC~~: NOT DETECTED
Cocaine Metabolite,Ur ~~LOC~~: NOT DETECTED
MDMA (Ecstasy)Ur Screen: NOT DETECTED
Methadone Scn, Ur: NOT DETECTED
Opiate, Ur Screen: NOT DETECTED
Phencyclidine (PCP) Ur S: NOT DETECTED
Tricyclic, Ur Screen: NOT DETECTED

## 2022-02-14 LAB — PHOSPHORUS: Phosphorus: 4.9 mg/dL — ABNORMAL HIGH (ref 2.5–4.6)

## 2022-02-14 LAB — GLUCOSE, CAPILLARY
Glucose-Capillary: 295 mg/dL — ABNORMAL HIGH (ref 70–99)
Glucose-Capillary: 312 mg/dL — ABNORMAL HIGH (ref 70–99)
Glucose-Capillary: 271 mg/dL — ABNORMAL HIGH (ref 70–99)
Glucose-Capillary: 301 mg/dL — ABNORMAL HIGH (ref 70–99)
Glucose-Capillary: 304 mg/dL — ABNORMAL HIGH (ref 70–99)
Glucose-Capillary: 313 mg/dL — ABNORMAL HIGH (ref 70–99)
Glucose-Capillary: 251 mg/dL — ABNORMAL HIGH (ref 70–99)

## 2022-02-14 LAB — MAGNESIUM: Magnesium: 2.4 mg/dL (ref 1.7–2.4)

## 2022-02-14 MED ORDER — INSULIN ASPART 100 UNIT/ML IJ SOLN
8.0000 [IU] | INTRAMUSCULAR | Status: DC
  Administered 2022-02-14: 8 [IU] via SUBCUTANEOUS
  Filled 2022-02-14: qty 1

## 2022-02-14 MED ORDER — ENOXAPARIN SODIUM 100 MG/ML IJ SOSY
0.5000 mg/kg | PREFILLED_SYRINGE | INTRAMUSCULAR | Status: DC
  Administered 2022-02-14: 100 mg via SUBCUTANEOUS
  Filled 2022-02-14: qty 1

## 2022-02-14 MED ORDER — INSULIN ASPART 100 UNIT/ML IJ SOLN
10.0000 [IU] | Freq: Once | INTRAMUSCULAR | Status: AC
Start: 2022-02-14 — End: 2022-02-14
  Administered 2022-02-14: 10 [IU] via SUBCUTANEOUS
  Filled 2022-02-14: qty 1

## 2022-02-14 MED ORDER — POLYETHYLENE GLYCOL 3350 17 G PO PACK
17.0000 g | PACK | Freq: Every day | ORAL | Status: DC
  Administered 2022-02-15 – 2022-03-01 (×11): 17 g
  Filled 2022-02-14 (×11): qty 1

## 2022-02-14 MED ORDER — INSULIN ASPART 100 UNIT/ML IJ SOLN
4.0000 [IU] | INTRAMUSCULAR | Status: DC
  Administered 2022-02-14 (×3): 4 [IU] via SUBCUTANEOUS
  Filled 2022-02-14 (×3): qty 1

## 2022-02-14 MED ORDER — POLYETHYLENE GLYCOL 3350 17 G PO PACK
17.0000 g | PACK | Freq: Every day | ORAL | Status: DC | PRN
  Filled 2022-02-14: qty 1

## 2022-02-14 MED ORDER — VITAL 1.5 CAL PO LIQD
1000.0000 mL | ORAL | Status: DC
  Administered 2022-02-14 – 2022-02-25 (×11): 1000 mL

## 2022-02-14 NOTE — Progress Notes (Signed)
Placed back in PRVC mode Vt 570  PEEP 12 cm

## 2022-02-14 NOTE — TOC Initial Note (Signed)
Transition of Care [facility]) - Initial/Assessment Note    Patient Details  Name: Samuel Chavez MRN: [id number] Date of Birth: 1976/05/08  Transition of [facility]) CM/SW Contact:    Shelbie Hutching, RN Phone Number: 02/14/2022, 12:44 PM  Clinical Narrative:                 Patient admitted to the hospital with acute respiratory failure, patient has congestive heart failure and COPD.  Patient is currently in the ICU intubated and sedated.  Patient is on extremely high vent settings and 100% FiO2 and still not oxygenating well.  Prognosis is poor.  RNCM met with patient's daughter's Samuel Chavez and Samuel Chavez at the beside yesterday.  Patient is from home where he lives with the daughter Samuel Chavez and her 2 other brothers and their mother.  Patient was independent, not currently working.  Has had a CPAP in the past but not currently.    TOC will follow.    Expected Discharge Plan:  (TBD) Barriers to Discharge: Continued Medical Work up   Patient Goals and CMS Choice Patient states their goals for this hospitalization and ongoing recovery are:: patient is intubated and sedated unable to voice      Expected Discharge Plan and Services Expected Discharge Plan:  (TBD)   Discharge Planning Services: CM Consult   Living arrangements for the past 2 months: Single Family Home                                      Prior Living Arrangements/Services Living arrangements for the past 2 months: Single Family Home Lives with:: Adult Children, Relatives Patient language and need for interpreter reviewed:: Yes Do you feel safe going back to the place where you live?: Yes      Need for Family Participation in Patient Care: Yes (Comment) Care giver support system in place?: Yes (comment) (daughters)   Criminal Activity/Legal Involvement Pertinent to Current Situation/Hospitalization: No - Comment as needed  Activities of Daily Living      Permission Sought/Granted Permission sought to  share information with : Case Manager, Family Supports    Share Information with NAME: Samuel Chavez     Permission granted to share info w Relationship: daughter  Permission granted to share info w Contact Information: [phone]  Emotional Assessment Appearance:: Appears stated age Attitude/Demeanor/Rapport: Intubated (Following Commands or Not Following Commands) Affect (typically observed): Unable to Assess   Alcohol / Substance Use: Tobacco Use Psych Involvement: No (comment)  Admission diagnosis:  Acute respiratory failure (HCC) [J96.00] Respiratory failure ([place]) [J96.90] Peripheral edema [R60.9] Acute respiratory failure with hypoxia and hypercapnia (HCC) [J96.01, J96.02] Patient Active Problem List   Diagnosis Date Noted   Acute respiratory failure ([place]) 02/11/2022   Respiratory failure ([place]) 02/11/2022   Acute respiratory failure with hypoxia and hypercarbia ([place]) 02/11/2022   Acute on chronic systolic CHF (congestive heart failure) ([place]) 02/11/2022   Hypertensive urgency 02/11/2022   Type 2 diabetes mellitus with peripheral neuropathy ([place]) 02/11/2022   OBSTRUCTIVE SLEEP APNEA 04/30/2009   BELL'S PALSY, 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

## 2022-02-14 NOTE — Progress Notes (Signed)
   02/14/22 1545  Clinical Encounter Type  Visited With Family  Visit Type Initial;Spiritual support;Social support   Daryel November met with pt's sister and her friend/partner for first time. Chaplain B offered that she had met with pt's daughter earlier and that spiritual care support extended to all the family. Chaplain B provided a chance for the sister to express her feelings about his condition and to share her faith outlook. Chaplain B affirmed her faith and offered compassionate presence and named ways to remain hopeful and that prayers would be offered. Visit was brief but appreciated.  Chaplain B also spoke with care team because this is a large family and earlier there may have been some frustration about the limitation of visitation   Will continue to follow.

## 2022-02-14 NOTE — Progress Notes (Signed)
[facility] [place], [place] 02/14/22  Subjective:   Hospital day # 3  Patient remains critically ill intubated and sedated.  Daughter at bedside. Urine output of 5700 cc yesterday on Lasix drip.  Sedated with ketamine, fentanyl and midazolam.  Tube feeds at 45 cc/min.  FiO2 100 % Serum creatinine trends are improving as noted below.  Renal: 07/27 0701 - 07/28 0700 In: 3305.9 [I.V.:1552.8; NG/GT:1053.3; IV Piggyback:699.9] Out: 5700 [Urine:5700] Lab Results  Component Value Date   CREATININE 1.34 (H) 02/14/2022   CREATININE 1.51 (H) 02/13/2022   CREATININE 1.68 (H) 02/13/2022     Objective:  Vital signs in last 24 hours:  Temp:  [98.4 F (36.9 C)-99.4 F (37.4 C)] 98.8 F (37.1 C) (07/28 0700) Pulse Rate:  [92-105] 100 (07/28 0700) Resp:  [20] 20 (07/28 0700) BP: (118-132)/(64-75) 119/70 (07/28 0700) SpO2:  [89 %-91 %] 91 % (07/28 0700) FiO2 (%):  [100 %] 100 % (07/28 0300) Weight:  [200 kg] 200 kg (07/28 0437)  Weight change: -3 kg Filed Weights   02/12/22 0322 02/13/22 0422 02/14/22 0437  Weight: (!) 170.9 kg (!) 203 kg (!) 200 kg    Intake/Output:    Intake/Output Summary (Last 24 hours) at 02/14/2022 0826 Last data filed at 02/14/2022 0600 Gross per 24 hour  Intake 3305.88 ml  Output 5700 ml  Net -2394.12 ml     Physical Exam: General: Critically ill-appearing, laying in the bed  HEENT ET tube, OG tube in place  Pulm/lungs Ventilator assisted, coarse breath sounds  CVS/Heart Regular  Abdomen:  Soft, nontender, nondistended  Extremities: Trace to 1+ dependent edema  Neurologic: Sedated  Skin: Warm, dry  Access:        Basic Metabolic Panel:  Recent Labs  Lab 02/11/22 2218 02/12/22 0254 02/13/22 0411 02/13/22 1625 02/14/22 0445  NA 133* 132* 135 137 138  K 4.6 4.5 4.4 4.5 4.3  CL 92* 91* 94* 97* 97*  CO2 29 28 31  33* 33*  GLUCOSE 299* 335* 273* 296* 316*  BUN 14 17 36* 43* 47*  CREATININE 1.32* 1.44* 1.68* 1.51*  1.34*  CALCIUM 8.3* 8.3* 8.2* 8.2* 8.2*  MG 1.6*  --  2.1  --  2.4  PHOS  --   --  6.3*  --  4.9*     CBC: Recent Labs  Lab 02/11/22 1029 02/12/22 0254 02/13/22 0411 02/14/22 0445  WBC 7.7 11.1* 16.3* 14.1*  HGB 13.2 12.8* 11.7* 10.9*  HCT 44.1 42.5 39.2 36.3*  MCV 89.3 88.9 88.7 88.3  PLT 198 229 197 174     No results found for: "HEPBSAG", "HEPBSAB", "HEPBIGM"    Microbiology:  Recent Results (from the past 240 hour(s))  SARS Coronavirus 2 by RT PCR (hospital order, performed in [facility] hospital lab) *cepheid single result test* Anterior Nasal Swab     Status: None   Collection Time: 02/11/22  4:33 PM   Specimen: Anterior Nasal Swab  Result Value Ref Range Status   SARS Coronavirus 2 by RT PCR NEGATIVE NEGATIVE Final    Comment: (NOTE) SARS-CoV-2 target nucleic acids are NOT DETECTED.  The SARS-CoV-2 RNA is generally detectable in upper and lower respiratory specimens during the acute phase of infection. The lowest concentration of SARS-CoV-2 viral copies this assay can detect is 250 copies / mL. A negative result does not preclude SARS-CoV-2 infection and should not be used as the sole basis for treatment or other patient management decisions.  A negative result may  occur with improper specimen collection / handling, submission of specimen other than nasopharyngeal swab, presence of viral mutation(s) within the areas targeted by this assay, and inadequate number of viral copies (<250 copies / mL). A negative result must be combined with clinical observations, patient history, and epidemiological information.  Fact Sheet for Patients:   https://www.patel.info/  Fact Sheet for Healthcare Providers: https://hall.com/  This test is not yet approved or  cleared by the [place] FDA and has been authorized for detection and/or diagnosis of SARS-CoV-2 by FDA under an Emergency Use Authorization (EUA).  This EUA will  remain in effect (meaning this test can be used) for the duration of the COVID-19 declaration under Section 564(b)(1) of the Act, 21 U.S.C. section 360bbb-3(b)(1), unless the authorization is terminated or revoked sooner.  Performed at [facility], [address] [place], [place] [address]   MRSA Next Gen by PCR, Nasal     Status: None   Collection Time: 02/11/22  5:48 PM   Specimen: Nasal Mucosa; Nasal Swab  Result Value Ref Range Status   MRSA by PCR Next Gen NOT DETECTED NOT DETECTED Final    Comment: (NOTE) The GeneXpert MRSA Assay (FDA approved for NASAL specimens only), is one component of a comprehensive MRSA colonization surveillance program. It is not intended to diagnose MRSA infection nor to guide or monitor treatment for MRSA infections. Test performance is not FDA approved in patients less than 15 years old. Performed at [facility], [address] [place], [place] [address]   Culture, Respiratory w Gram Stain     Status: None (Preliminary result)   Collection Time: 02/13/22  9:16 PM   Specimen: Tracheal Aspirate; Respiratory  Result Value Ref Range Status   Specimen Description   Final    TRACHEAL ASPIRATE Performed at [facility], [address] [place], [place] [address]    Special Requests   Final    NONE Performed at [facility], [place], [place] [address]    Gram Stain   Final    NO SQUAMOUS EPITHELIAL CELLS SEEN FEW WBC SEEN FEW GRAM NEGATIVE RODS MODERATE GRAM POSITIVE COCCI Performed at [facility], [place] [address], [place], [place] [address]    Culture PENDING  Incomplete   Report Status PENDING  Incomplete    Coagulation Studies: No results for input(s): "LABPROT", "INR" in the last 72 hours.  Urinalysis: No results for input(s): "COLORURINE", "LABSPEC", "PHURINE", "GLUCOSEU", "HGBUR", "BILIRUBINUR", "KETONESUR", "PROTEINUR", "UROBILINOGEN", "NITRITE", "LEUKOCYTESUR" in the  last 72 hours.  Invalid input(s): "APPERANCEUR"    Imaging: DG Chest 1 View  Result Date: 02/13/2022 CLINICAL DATA:  Mucous plugging the respiratory tract. EXAM: CHEST  1 VIEW COMPARISON:  February 13, 2022 FINDINGS: An endotracheal tube is seen with its distal tip proximally 7.6 cm from the carina. A nasogastric tube is seen with its distal end extending below the level of the diaphragm. The cardiac silhouette is enlarged and unchanged in size. There is mild to moderate severity prominence of the perihilar pulmonary vasculature. Mild bibasilar airspace disease is seen. There is no evidence of a pleural effusion or pneumothorax. The visualized skeletal structures are unremarkable. IMPRESSION: 1. Cardiomegaly with mild to moderate severity pulmonary vascular congestion. 2. Mild bibasilar airspace disease which may represent atelectasis and/or infiltrate. Electronically Signed   By: Virgina Norfolk M.D.   On: 02/13/2022 20:03   DG Chest Port 1 View  Result Date: 02/13/2022 CLINICAL DATA:  Acute on  chronic respiratory failure. EXAM: PORTABLE CHEST 1 VIEW COMPARISON:  Portable chest yesterday at 7:40 a.m. FINDINGS: 5:15 a.m. ETT tip is 4.5 cm from the carina. NGT is not well seen in the stomach due to body habitus but tip is probably in the body of the stomach. A right PICC has been inserted. The tip is at the level of the superior cavoatrial junction. Moderate to severe cardiomegaly.  Stable mediastinal configuration. There is mild central vascular prominence without overt edema, with small to moderate-sized pleural effusions and opacities in the lower lung fields which could be due to atelectasis or consolidation. There are perihilar atelectatic bands which appears similar. The upper lung fields are otherwise clear. No pneumothorax. IMPRESSION: 1. Support tubes as above, with interval right PICC with tip at the superior cavoatrial junction. 2. Cardiomegaly and mild central vascular fullness without visible  edema, small to moderate-sized pleural effusions, and stable overlying opacities in the lung bases. Electronically Signed   By: Telford Nab M.D.   On: 02/13/2022 06:59   [place] EKG SITE RITE  Result Date: 02/12/2022 If Site Rite image not attached, placement could not be confirmed due to current cardiac rhythm.    Medications:    sodium chloride     sodium chloride Stopped (02/11/22 2011)   ceFEPime (MAXIPIME) IV Stopped (02/14/22 0546)   fentaNYL infusion INTRAVENOUS 200 mcg/hr (02/14/22 0600)   furosemide (LASIX) 200 mg in dextrose 5 % 100 mL (2 mg/mL) infusion 4 mg/hr (02/14/22 0600)   ketamine (KETALAR) 2,500 mg in sodium chloride 0.9 % 500 mL (5 mg/mL) infusion 1.5 mg/kg/hr (02/14/22 0600)   midazolam 10 mg/hr (02/14/22 0600)    budesonide (PULMICORT) nebulizer solution  0.5 mg Nebulization BID   Chlorhexidine Gluconate Cloth  6 each Topical Q0600   diazepam  10 mg Per Tube Q6H   docusate  100 mg Per Tube BID   enoxaparin (LOVENOX) injection  0.5 mg/kg Subcutaneous Q24H   feeding supplement (PROSource TF)  90 mL Per Tube 5 X Daily   feeding supplement (VITAL 1.5 CAL)  1,000 mL Per Tube Q24H   free water  30 mL Per Tube Q4H   insulin aspart  0-20 Units Subcutaneous Q4H   insulin glargine-yfgn  26 Units Subcutaneous Daily   insulin starter kit- pen needles  1 kit Other Once   ipratropium-albuterol  3 mL Nebulization Q6H   living well with diabetes book   Does not apply Once   methylPREDNISolone (SOLU-MEDROL) injection  60 mg Intravenous Q12H   multivitamin  1 tablet Per Tube QHS   mouth rinse  15 mL Mouth Rinse Q2H   oxyCODONE  10 mg Per Tube Q6H   pantoprazole sodium  40 mg Per Tube QHS   polyethylene glycol  17 g Oral Daily   sodium chloride flush  10-40 mL Intracatheter Q12H   sodium chloride flush  3 mL Intravenous Q12H   vancomycin variable dose per unstable renal function (pharmacist dosing)   Does not apply See admin instructions   sodium chloride, acetaminophen  **OR** acetaminophen, cyclobenzaprine, docusate sodium, fentaNYL, LORazepam, magnesium hydroxide, ondansetron **OR** ondansetron (ZOFRAN) IV, mouth rinse, oxyCODONE-acetaminophen, polyethylene glycol, sodium chloride flush, sodium chloride flush, traZODone, vecuronium  Assessment/ Plan:  46 y.o. male with medical problems of poorly controlled diabetes, hypertension, systolic CHF, obstructive sleep apnea, obesity    admitted on 02/11/2022 for Acute respiratory failure (HCC) [J96.00] Respiratory failure (HCC) [J96.90] Peripheral edema [R60.9] Acute respiratory failure with hypoxia and hypercapnia (HCC) [J96.01, J96.02]  Acute kidney injury.  Baseline creatinine 0.96 from February 11, 2022. Secondary to hemodynamic compromise caused by cardiorenal syndrome.  At present he is responding well to IV furosemide infusion.  Urine output of about 5700 cc yesterday.  Serum creatinine trend is improving.  Continue to monitor.  No acute indication for dialysis.  Acute respiratory failure Ventilator assisted.  Management as per ICU team.  Still requiring high dose of FiO2.  Acute exacerbation of systolic and diastolic CHF 2D echo from February 11, 2022-LVEF 55 to 60%, moderate LVH, grade 1 diastolic dysfunction, enlarged right ventricle, moderately dilated right atrium, moderately dilated left atrium,  Poorly controlled diabetes type 2  Lab Results  Component Value Date   HGBA1C 10.1 (H) 02/11/2022  Obtain urine protein to creatinine ratio.    LOS: [place] 7/28/20238:26 AM  [place], [place]  Note: This note was prepared with Dragon dictation. Any transcription errors are unintentional

## 2022-02-14 NOTE — Progress Notes (Signed)
Mode changed to pc

## 2022-02-14 NOTE — Progress Notes (Addendum)
   02/14/22 1105  Clinical Encounter Type  Visited With Family;Health care provider  Visit Type Initial  Referral From Family  Consult/Referral To Chaplain  Spiritual Encounters  Spiritual Needs Emotional   Daryel November met with pt's daughter, Wandra Arthurs, and her mother, Ardelle Park, to support them as they seek clarity on pt's prognosis. We were joined by Darlyn Chamber, NP who met with family to offer them better understanding of Mr. Strausser condition and what to consider over the next days.  Family emotional but did feel better about what to expect and also more accepting of possibility of a trach; currently need to see trend of response to interventions.  Ardelle Park shared with this chaplain some additional stressors on this family; Destinie was experiencing a lot of emotion and not really wanting to talk at this time. Chaplain B let her know she would follow-up to support her needs.  Chaplain B reached out by secure chat to Doran Clay, CSW and she is now more aware of additional specifics in order to help connect family with resources and also how to contact pt's ex-wife/Destinie's mom, Ardelle Park.

## 2022-02-14 NOTE — Consult Note (Signed)
PHARMACY CONSULT NOTE  Pharmacy Consult for Electrolyte Monitoring and Replacement   Recent Labs: Potassium (mmol/L)  Date Value  02/14/2022 4.3   Magnesium (mg/dL)  Date Value  02/14/2022 2.4   Calcium (mg/dL)  Date Value  02/14/2022 8.2 (L)   Albumin (g/dL)  Date Value  02/11/2022 3.5   Phosphorus (mg/dL)  Date Value  02/14/2022 4.9 (H)   Sodium (mmol/L)  Date Value  02/14/2022 138   Assessment: Patient is a 46 y/o M with medical history including DM, HTN, pancreatitis, tobacco use disorder, systolic CHF, OSA, DM c/b diabetic neuropathy, lumbar radiculopathy, morbid obesity who is admitted with acute respiratory failure in setting of acute CHF and hypertensive urgency. Patient is currently intubated, sedated, and on mechanical ventilation in the ICU. Pharmacy consulted to assist with electrolyte monitoring and replacement as indicated.  Nutrition: Trickle feeds started 7/26 Diuretics: Lasix gtt at 4 mg/hr + metolazone 2.5 mg per tube 7/27 x 1  Goal of Therapy:  Electrolytes within normal limits  Plan:  --No electrolyte replacement indicated at this time --Follow-up electrolytes with AM labs tomorrow  Benita Gutter 02/14/2022 8:33 AM

## 2022-02-14 NOTE — Progress Notes (Signed)
Peep to 15 cm Rate to 24 per lDr Patsey Berthold

## 2022-02-14 NOTE — Inpatient Diabetes Management (Signed)
Inpatient Diabetes Program Recommendations  AACE/ADA: New Consensus Statement on Inpatient Glycemic Control (2015)  Target Ranges:  Prepandial:   less than 140 mg/dL      Peak postprandial:   less than 180 mg/dL (1-2 hours)      Critically ill patients:  140 - 180 mg/dL   Lab Results  Component Value Date   GLUCAP 295 (H) 02/14/2022   HGBA1C 10.1 (H) 02/11/2022    Latest Reference Range & Units 02/13/22 19:32 02/13/22 23:09 02/14/22 04:09  Glucose-Capillary 70 - 99 mg/dL 274 (H) 260 (H) 295 (H)  (H): Data is abnormally high   Current orders for Inpatient glycemic control: Novolog 0-20 units Q4H, Semglee 26 units QD, Solumedrol 60 mg Q12H,Vital 1.5 @ 45 ml/hr  Inpatient Diabetes Program Recommendations:    Please consider:  Novolog 4 units Q4H tube feed coverage.  Stop if feeds ar held or discontinued.  Will continue to follow while inpatient.  Thank you, Reche Dixon, MSN, [place] Diabetes Coordinator Inpatient Diabetes Program [phone] (team pager from 8a-5p)

## 2022-02-14 NOTE — IPAL (Signed)
  Interdisciplinary Goals of Care Family Meeting   Date carried out: 02/14/2022  Location of the meeting: Bedside  Member's involved: Nurse Practitioner, Chaplain, and Family Member or next of kin  Durable Power of Attorney or acting medical decision maker: Daughter, pt's significant other (children's mother)   Discussion: We discussed goals of care for [name] .  Discussed severe Acute Hypoxic & Hypercapnic Respiratory Failure due to Acute CHF, pneumonia, and OSA/OHS requiring very high ventilator support.  Discussion to allow 24-48 hrs to assess for response to current treatments.  Also discussed that pt would likely benefit from Tracheostomy (once more stable) to help wean from ventilator and prevent recurrent hospitalizations from chronic illnesses (OSA/OHS not on CPAP, CHF, etc.).  Code status: Full Code  Disposition: Continue current acute care  Time spent for the meeting: 20 minutes   Samuel Chavez, AGACNP-BC [place] epic messenger for cross cover needs If after hours, please call E-link  Samuel Bienenstock, NP  02/14/2022, 11:29 AM

## 2022-02-14 NOTE — Progress Notes (Signed)
Recruitment Maneuver of 35 cmH2O for 30 sec x 3 completed O2 sat increased to 92%

## 2022-02-14 NOTE — Progress Notes (Signed)
NAME:  Samuel Chavez, MRN:  [id number], DOB:  [birth year], LOS: 3 ADMISSION DATE:  02/11/2022, CONSULTATION DATE: 02/11/22 REFERRING MD:  Valente David MD  CHIEF COMPLAINT: SOB   CC  follow up RESP FAILURE/acute CHF exacerbation  HPI/SYNOPSIS  46 y.o male with significant PMH as below who presented to the ED with chief complaints of SOB and worsening bilateral lower extremities edema.  ED Course: In the emergency department, the temperature was 37.3C, the heart rate 99 beats/minute, the blood pressure 187/102 mm Hg, the respiratory rate 20 breaths/minute, and the oxygen saturation 89% on. He was noted to be drowsy but still protecting his airway and responding appropriately.  Pertinent Labs/Diagnostics Findings: Chemistry:Glucose:319 Calcium: 8.8 otherwise unremarkable CBC: unremarkable Other Lab findings:  COVID PCR: Negative, BNP: pending, Venous Blood Gas result:  pO2 pending; pCO2 87; pH 7.27;  HCO3 40, %O2 Sat 48.7.  Imaging: Chest x-ray showed cardiomegaly and pulmonary vascular congestion without overt pulmonary edema  Patient was placed on BiPAP for acute on chronic hypoxic hypercapnic respiratory failure.  He was treated with IV Lasix, Solu-Medrol, DuoNebs and admitted to hospitalist service.  Patient was initially improving on BiPAP however on reassessment he was noted to be minimally responsive with snoring respirations.  Due to concerns for worsening hypercapnia and impending respiratory failure he was intubated for airway protection.    Significant Hospital Events   7/25: Admitted to hospitalist service w/acute on chronic hypoxic hypercapnic resp. failure requiring BiPAP.Failed BiPAP and intubated. PCCM consulted 7/26: INTUBATED, difficlut to sedate, started KETAMINE INFUSION 7/27: severe hypoxia, PEEP increased to 15 but decreased back to 10 7/28: Persistent severe hypoxia, ventilator changes made  Consults:  PCCM  Procedures:  7/25: Intubation  Significant  Diagnostic Tests:  7/25: Chest Xray> Chest x-ray showed cardiomegaly and pulmonary vascular congestion without overt pulmonary edema   Micro Data:  7/25: SARS-CoV-2 PCR> negative 7/25: MRSA PCR>> NEG 7/27: Strep pneumoniae Ag>> negative  Antimicrobials:  None   EVENTS OVERNIGHT Persistent severe hypoxia on 100% FiO2 Unable to wean from vent due to increased FiO2 requirement Difficult to sedate, becomes asynchronous with vent when sedation lightened Prognosis is guarded, family updated, they are upset Will need trach  OBJECTIVE  Blood pressure 119/70, pulse 100, temperature 98.8 F (37.1 C), resp. rate 20, height 5' 9.02" (1.753 m), weight (!) 200 kg, SpO2 93 %. CVP:  [13 mmHg-20 mmHg] 20 mmHg   Vent Mode: PRVC FiO2 (%):  [100 %] 100 % Set Rate:  [20 bmp] 20 bmp Vt Set:  [570 mL] 570 mL PEEP:  [1 cmH20-10 cmH20] 10 cmH20 Plateau Pressure:  [25 cmH20-29 cmH20] 29 cmH20   Intake/Output Summary (Last 24 hours) at 02/14/2022 9179 Last data filed at 02/14/2022 0600 Gross per 24 hour  Intake 3305.88 ml  Output 5700 ml  Net -2394.12 ml    Filed Weights   02/12/22 0322 02/13/22 0422 02/14/22 0437  Weight: (!) 170.9 kg (!) 203 kg (!) 200 kg    REVIEW OF SYSTEMS  PATIENT IS UNABLE TO PROVIDE COMPLETE REVIEW OF SYSTEMS DUE TO SEVERE CRITICAL ILLNESS    PHYSICAL EXAMINATION: GENERAL: Morbidly obese man, intubated, mechanically ventilated, sedated. HEAD: Normocephalic, atraumatic.  EYES: Pupils equal, round, reactive to light.  No scleral icterus.  MOUTH: Orotracheally intubated, OG in place. NECK: Supple. No thyromegaly. Trachea midline. No JVD.  No adenopathy. PULMONARY: Good air entry bilaterally.  Occasional rhonchi noted. CARDIOVASCULAR: S1 and S2. Regular rate and rhythm.  ABDOMEN: Obese, soft, nondistended,  tolerating tube MUSCULOSKELETAL: No joint deformity, no clubbing, no edema.  SCDs in place. NEUROLOGIC: Sedated, becomes agitated when sedation lightened, not  following commands.  Moves all 4 spontaneously when agitated. SKIN: Intact,warm,dry. PSYCH: Not assessed due to mechanically ventilated status.  Labs/imaging that I havepersonally reviewed    Labs   CBC: Recent Labs  Lab 02/11/22 1029 02/12/22 0254 02/13/22 0411 02/14/22 0445  WBC 7.7 11.1* 16.3* 14.1*  HGB 13.2 12.8* 11.7* 10.9*  HCT 44.1 42.5 39.2 36.3*  MCV 89.3 88.9 88.7 88.3  PLT 198 229 197 174     Basic Metabolic Panel: Recent Labs  Lab 02/11/22 2218 02/12/22 0254 02/13/22 0411 02/13/22 1625 02/14/22 0445  NA 133* 132* 135 137 138  K 4.6 4.5 4.4 4.5 4.3  CL 92* 91* 94* 97* 97*  CO2 29 28 31  33* 33*  GLUCOSE 299* 335* 273* 296* 316*  BUN 14 17 36* 43* 47*  CREATININE 1.32* 1.44* 1.68* 1.51* 1.34*  CALCIUM 8.3* 8.3* 8.2* 8.2* 8.2*  MG 1.6*  --  2.1  --  2.4  PHOS  --   --  6.3*  --  4.9*    GFR: Estimated Creatinine Clearance: 121.8 mL/min (A) (by C-G formula based on SCr of 1.34 mg/dL (H)). Recent Labs  Lab 02/11/22 1029 02/11/22 2218 02/11/22 2218 02/12/22 0051 02/12/22 0254 02/12/22 0602 02/13/22 0411 02/13/22 1146 02/13/22 1625 02/14/22 0445  PROCALCITON  --  <0.10  --   --   --   --  <0.10  --   --   --   WBC 7.7  --   --   --  11.1*  --  16.3*  --   --  14.1*  LATICACIDVEN  --  4.0*   < > 4.1*  --  3.4*  --  2.4* 2.6*  --    < > = values in this interval not displayed.     Liver Function Tests: Recent Labs  Lab 02/11/22 2218  AST 23  ALT 20  ALKPHOS 69  BILITOT 0.8  PROT 7.2  ALBUMIN 3.5    No results for input(s): "LIPASE", "AMYLASE" in the last 168 hours. No results for input(s): "AMMONIA" in the last 168 hours.  ABG    Component Value Date/Time   PHART 7.34 (L) 02/13/2022 1201   PCO2ART 65 (H) 02/13/2022 1201   PO2ART 74 (L) 02/13/2022 1201   HCO3 35.1 (H) 02/13/2022 1201   TCO2 26 03/17/2007 1822   O2SAT 95.7 02/13/2022 1201     Home Medications  Prior to Admission medications   Medication Sig Start Date End  Date Taking? Authorizing Provider  losartan (COZAAR) 100 MG tablet Take 100 mg by mouth daily. 02/01/22  Yes [provider]  OZEMPIC, 0.25 OR 0.5 MG/DOSE, 2 MG/3ML SOPN SMARTSIG:0.25 Milligram(s) SUB-Q Once a Week 12/09/21  Yes [provider]  torsemide (DEMADEX) 20 MG tablet Take 20 mg by mouth daily. 12/18/21  Yes [provider]  Aspirin-Salicylamide-Caffeine (BC HEADACHE POWDER PO) Take 1 packet by mouth as needed (for pain).    [provider]  cephALEXin (KEFLEX) 500 MG capsule Take 1 capsule (500 mg total) by mouth 4 (four) times daily. Patient not taking: Reported on 02/11/2022 12/16/18   12/18/18, MD  cyclobenzaprine (FLEXERIL) 10 MG tablet Take 0.5-1 tablets (5-10 mg total) by mouth 2 (two) times daily as needed for muscle spasms. Patient not taking: Reported on 02/11/2022 03/12/18   03/14/18, PA-C  gabapentin (NEURONTIN)  600 MG tablet Take 600 mg by mouth at bedtime. Patient not taking: Reported on 02/11/2022 10/13/21   [provider]  LABETALOL HCL PO Take by mouth. Patient not taking: Reported on 02/11/2022    [provider]  meloxicam (MOBIC) 15 MG tablet Take 1 tablet (15 mg total) by mouth daily. Take 1 daily with food. Patient not taking: Reported on 02/11/2022 03/12/18   Margarita Mail, PA-C  metFORMIN (GLUCOPHAGE) 500 MG tablet Take 500 mg by mouth 2 (two) times daily with a meal. Patient not taking: Reported on 02/11/2022    [provider]  METFORMIN HCL ER, MOD, PO Take by mouth. Patient not taking: Reported on 02/11/2022    [provider]  METOPROLOL SUCCINATE ER PO Take by mouth. Patient not taking: Reported on 02/11/2022    [provider]  oxyCODONE-acetaminophen (PERCOCET) 10-325 MG tablet Take 1 tablet by mouth See admin instructions. Take 1 tablet by mouth 4-5 times a day as needed for pain Patient not taking: Reported on 02/11/2022    [provider]   oxyCODONE-acetaminophen (PERCOCET/ROXICET) 5-325 MG tablet Take 1 tablet by mouth every 8 (eight) hours as needed for severe pain. Patient not taking: Reported on 02/11/2022 12/16/18   Davonna Belling, MD  predniSONE (DELTASONE) 20 MG tablet Take 2 tablets (40 mg total) by mouth daily. Patient not taking: Reported on 02/11/2022 04/04/15   Davonna Belling, MD   Hospital Scheduled Meds:  budesonide (PULMICORT) nebulizer solution  0.5 mg Nebulization BID   Chlorhexidine Gluconate Cloth  6 each Topical Q0600   diazepam  10 mg Per Tube Q6H   docusate  100 mg Per Tube BID   enoxaparin (LOVENOX) injection  0.5 mg/kg Subcutaneous Q24H   feeding supplement (PROSource TF)  90 mL Per Tube 5 X Daily   feeding supplement (VITAL 1.5 CAL)  1,000 mL Per Tube Q24H   free water  30 mL Per Tube Q4H   insulin aspart  0-20 Units Subcutaneous Q4H   insulin glargine-yfgn  26 Units Subcutaneous Daily   insulin starter kit- pen needles  1 kit Other Once   ipratropium-albuterol  3 mL Nebulization Q6H   living well with diabetes book   Does not apply Once   methylPREDNISolone (SOLU-MEDROL) injection  60 mg Intravenous Q12H   multivitamin  1 tablet Per Tube QHS   mouth rinse  15 mL Mouth Rinse Q2H   oxyCODONE  10 mg Per Tube Q6H   pantoprazole sodium  40 mg Per Tube QHS   [START ON 02/15/2022] polyethylene glycol  17 g Per Tube Daily   sodium chloride flush  10-40 mL Intracatheter Q12H   sodium chloride flush  3 mL Intravenous Q12H   vancomycin variable dose per unstable renal function (pharmacist dosing)   Does not apply See admin instructions   Continuous Infusions:  sodium chloride     sodium chloride Stopped (02/11/22 2011)   ceFEPime (MAXIPIME) IV Stopped (02/14/22 0546)   fentaNYL infusion INTRAVENOUS 200 mcg/hr (02/14/22 0830)   furosemide (LASIX) 200 mg in dextrose 5 % 100 mL (2 mg/mL) infusion 4 mg/hr (02/14/22 0600)   ketamine (KETALAR) 2,500 mg in sodium chloride 0.9 % 500 mL (5 mg/mL) infusion  1.5 mg/kg/hr (02/14/22 0600)   midazolam 10 mg/hr (02/14/22 0829)   PRN Meds:.sodium chloride, acetaminophen **OR** acetaminophen, cyclobenzaprine, docusate sodium, fentaNYL, LORazepam, magnesium hydroxide, ondansetron **OR** ondansetron (ZOFRAN) IV, mouth rinse, oxyCODONE-acetaminophen, polyethylene glycol, sodium chloride flush, sodium chloride flush, traZODone, vecuronium  Active Hospital  Problem list     Assessment & Plan:   46 yo morbidly obese AAM with severe decompensated acute systolic and diastolic heart failure with previously documented severe OSA/OHS, noncompliant with CPAP, active smoker, leading to severe hypoxic resp failure with encephalopathy and difficulty with sedation  Severe ACUTE Hypoxic and Hypercapnic Respiratory Failure - continue Mechanical Ventilator support - Wean Fio2 and PEEP as tolerated - VAP/VENT bundle implementation - Wean PEEP & FiO2 as tolerated, maintain SpO2 > 88% - Head of bed elevated 30 degrees, VAP protocol in place - Plateau pressures less than 30 cm H20  - Intermittent chest x-ray & ABG PRN - Ensure adequate pulmonary hygiene  - will NOT perform SAT/SBT due to high FiO2 requirements, asynchrony when sedation lightened   ACUTE SYSTOLIC CARDIAC FAILURE- EF 35% Continue ventilator support Lasix infusion Creatinine 1.34 today Hold metoprolol and Losartan due to sedation related hypotension 2D Echocardiogram shows diastolic dysfunction and enlarged left and right atria consistent with restrictive physiology  ACUTE KIDNEY INJURY/Renal Failure PROGRESSIVE CARDIORENAL SYDNROME -continue Foley catheter-assess need -Avoid nephrotoxic agents -Follow urine output, BMP -Ensure adequate renal perfusion, optimize oxygenation -Renal dose medications -Renal consulted and following, appreciate input   Intake/Output Summary (Last 24 hours) at 02/14/2022 0937 Last data filed at 02/14/2022 0600 Gross per 24 hour  Intake 3305.88 ml  Output 5700 ml   Net -2394.12 ml        Latest Ref Rng & Units 02/14/2022    4:45 AM 02/13/2022    4:25 PM 02/13/2022    4:11 AM  BMP  Glucose 70 - 99 mg/dL 316  296  273   BUN 6 - 20 mg/dL 47  43  36   Creatinine 0.61 - 1.24 mg/dL 1.34  1.51  1.68   Sodium 135 - 145 mmol/L 138  137  135   Potassium 3.5 - 5.1 mmol/L 4.3  4.5  4.4   Chloride 98 - 111 mmol/L 97  97  94   CO2 22 - 32 mmol/L 33  33  31   Calcium 8.9 - 10.3 mg/dL 8.2  8.2  8.2     ENDO - ICU hypoglycemic\Hyperglycemia protocol -check FSBS per protocol Diabetes mellitus HgbA1c  10.1 -CBG's AC & hs; Target range of 140 to 180 -SSI -Follow ICU Hypo/Hyperglycemia protocol -Hold Metformin  GI GI PROPHYLAXIS PPI  NUTRITIONAL STATUS DIET-->TF's as tolerated Constipation protocol as indicated  ACUTE ANEMIA- Transfuse as needed Consider transfusion  if hgb<7  Best practice:  Diet:  NPO Pain/Anxiety/Delirium protocol (if indicated): Yes (RASS goal 0) VAP protocol (if indicated): Yes DVT prophylaxis: LMWH GI prophylaxis: PPI Glucose control:  SSI Yes Central venous access:  N/A Arterial line:  N/A Foley:  Yes, and it is still needed Mobility:  bed rest  PT consulted: N/A Last date of multidisciplinary goals of care discussion [7/25] Code Status:  full code Disposition: ICU  CASE DISCUSSED IN [place] ICU TEAM   The patient is critically ill with multiple organ systems failure and requires high complexity decision making for assessment and support, frequent evaluation and titration of therapies, application of advanced monitoring technologies and extensive interpretation of multiple databases. Critical Care Time devoted to patient care services described in this note is 45 minutes.   Renold Don, MD Advanced Bronchoscopy PCCM [facility]    *This note was dictated using voice recognition software/Dragon.  Despite best efforts to proofread, errors can occur which can change  the meaning. Any transcriptional errors that  result from this process are unintentional and may not be fully corrected at the time of dictation.

## 2022-02-15 ENCOUNTER — Encounter: Payer: Self-pay | Admitting: Internal Medicine

## 2022-02-15 ENCOUNTER — Inpatient Hospital Stay: Payer: Medicaid Other

## 2022-02-15 DIAGNOSIS — J9601 Acute respiratory failure with hypoxia: Secondary | ICD-10-CM | POA: Diagnosis not present

## 2022-02-15 DIAGNOSIS — J9602 Acute respiratory failure with hypercapnia: Secondary | ICD-10-CM | POA: Diagnosis not present

## 2022-02-15 LAB — GLUCOSE, CAPILLARY
Glucose-Capillary: 161 mg/dL — ABNORMAL HIGH (ref 70–99)
Glucose-Capillary: 210 mg/dL — ABNORMAL HIGH (ref 70–99)
Glucose-Capillary: 219 mg/dL — ABNORMAL HIGH (ref 70–99)
Glucose-Capillary: 255 mg/dL — ABNORMAL HIGH (ref 70–99)
Glucose-Capillary: 203 mg/dL — ABNORMAL HIGH (ref 70–99)
Glucose-Capillary: 134 mg/dL — ABNORMAL HIGH (ref 70–99)

## 2022-02-15 LAB — HEPARIN LEVEL (UNFRACTIONATED): Heparin Unfractionated: 0.62 IU/mL (ref 0.30–0.70)

## 2022-02-15 LAB — CBC
HCT: 38 % — ABNORMAL LOW (ref 39.0–52.0)
Hemoglobin: 11.1 g/dL — ABNORMAL LOW (ref 13.0–17.0)
MCH: 26.6 pg (ref 26.0–34.0)
MCHC: 29.2 g/dL — ABNORMAL LOW (ref 30.0–36.0)
MCV: 90.9 fL (ref 80.0–100.0)
Platelets: 180 10*3/uL (ref 150–400)
RBC: 4.18 MIL/uL — ABNORMAL LOW (ref 4.22–5.81)
RDW: 15.3 % (ref 11.5–15.5)
WBC: 14.6 10*3/uL — ABNORMAL HIGH (ref 4.0–10.5)
nRBC: 0 % (ref 0.0–0.2)

## 2022-02-15 LAB — BLOOD GAS, ARTERIAL
Acid-Base Excess: 12.9 mmol/L — ABNORMAL HIGH (ref 0.0–2.0)
Bicarbonate: 41.6 mmol/L — ABNORMAL HIGH (ref 20.0–28.0)
FIO2: 1 %
MECHVT: 570 mL
Mechanical Rate: 20
O2 Saturation: 93.4 %
PEEP: 12 cmH2O
Patient temperature: 37
pCO2 arterial: 72 mmHg (ref 32–48)
pH, Arterial: 7.37 (ref 7.35–7.45)
pO2, Arterial: 67 mmHg — ABNORMAL LOW (ref 83–108)

## 2022-02-15 LAB — BASIC METABOLIC PANEL
Anion gap: 5 (ref 5–15)
BUN: 46 mg/dL — ABNORMAL HIGH (ref 6–20)
CO2: 36 mmol/L — ABNORMAL HIGH (ref 22–32)
Calcium: 8.3 mg/dL — ABNORMAL LOW (ref 8.9–10.3)
Chloride: 104 mmol/L (ref 98–111)
Creatinine, Ser: 1.16 mg/dL (ref 0.61–1.24)
GFR, Estimated: >60 mL/min (ref >60)
Glucose, Bld: 174 mg/dL — ABNORMAL HIGH (ref 70–99)
Potassium: 4 mmol/L (ref 3.5–5.1)
Sodium: 145 mmol/L (ref 135–145)

## 2022-02-15 LAB — MAGNESIUM: Magnesium: 2.4 mg/dL (ref 1.7–2.4)

## 2022-02-15 LAB — PHOSPHORUS: Phosphorus: 3.9 mg/dL (ref 2.5–4.6)

## 2022-02-15 LAB — D-DIMER, QUANTITATIVE: D-Dimer, Quant: 0.55 ug/mL-FEU — ABNORMAL HIGH (ref 0.00–0.50)

## 2022-02-15 MED ORDER — HEPARIN BOLUS VIA INFUSION
7500.0000 [IU] | Freq: Once | INTRAVENOUS | Status: AC
  Administered 2022-02-15: 7500 [IU] via INTRAVENOUS
  Filled 2022-02-15: qty 7500

## 2022-02-15 MED ORDER — INSULIN ASPART 100 UNIT/ML IJ SOLN
6.0000 [IU] | INTRAMUSCULAR | Status: DC
  Administered 2022-02-15 (×4): 6 [IU] via SUBCUTANEOUS
  Filled 2022-02-15 (×4): qty 1

## 2022-02-15 MED ORDER — FUROSEMIDE 10 MG/ML IJ SOLN
40.0000 mg | Freq: Two times a day (BID) | INTRAMUSCULAR | Status: DC
  Administered 2022-02-15 – 2022-02-16 (×3): 40 mg via INTRAVENOUS
  Filled 2022-02-15 (×4): qty 4

## 2022-02-15 MED ORDER — INSULIN ASPART 100 UNIT/ML IJ SOLN
8.0000 [IU] | INTRAMUSCULAR | Status: DC
  Administered 2022-02-15 – 2022-02-24 (×51): 8 [IU] via SUBCUTANEOUS
  Filled 2022-02-15 (×47): qty 1

## 2022-02-15 MED ORDER — ACETAZOLAMIDE SODIUM 500 MG IJ SOLR
500.0000 mg | Freq: Once | INTRAMUSCULAR | Status: AC
  Administered 2022-02-15: 500 mg via INTRAVENOUS
  Filled 2022-02-15: qty 500

## 2022-02-15 MED ORDER — PROSOURCE TF PO LIQD
90.0000 mL | Freq: Every day | ORAL | Status: DC
  Administered 2022-02-15 – 2022-02-25 (×50): 90 mL
  Filled 2022-02-15 (×54): qty 90

## 2022-02-15 MED ORDER — HEPARIN (PORCINE) 25000 UT/250ML-% IV SOLN
2000.0000 [IU]/h | INTRAVENOUS | Status: DC
  Administered 2022-02-15 – 2022-02-17 (×4): 2000 [IU]/h via INTRAVENOUS
  Filled 2022-02-15 (×4): qty 250

## 2022-02-15 MED ORDER — MIDAZOLAM BOLUS VIA INFUSION
4.0000 mg | Freq: Once | INTRAVENOUS | Status: AC
  Administered 2022-02-15: 4 mg via INTRAVENOUS
  Filled 2022-02-15: qty 4

## 2022-02-15 MED ORDER — MIDAZOLAM BOLUS VIA INFUSION
2.0000 mg | INTRAVENOUS | Status: DC | PRN
  Administered 2022-02-15 – 2022-02-16 (×3): 4 mg via INTRAVENOUS
  Administered 2022-02-23: 2 mg via INTRAVENOUS

## 2022-02-15 NOTE — Progress Notes (Signed)
  NAME:  Samuel Chavez, MRN:  [id number], DOB:  10/02/1975, LOS: 4 ADMISSION DATE:  02/11/2022, CONSULTATION DATE: 02/11/22 REFERRING MD:  Mansy, Jan MD  CHIEF COMPLAINT: SOB   CC  follow up RESP FAILURE/acute CHF exacerbation  HPI/SYNOPSIS  46 y.o male with significant PMH as below who presented to the ED with chief complaints of SOB and worsening bilateral lower extremities edema.  ED Course: In the emergency department, the temperature was 37.3C, the heart rate 99 beats/minute, the blood pressure 187/102 mm Hg, the respiratory rate 20 breaths/minute, and the oxygen saturation 89% on. He was noted to be drowsy but still protecting his airway and responding appropriately.  Pertinent Labs/Diagnostics Findings: Chemistry:Glucose:319 Calcium: 8.8 otherwise unremarkable CBC: unremarkable Other Lab findings:  COVID PCR: Negative, BNP: pending, Venous Blood Gas result:  pO2 pending; pCO2 87; pH 7.27;  HCO3 40, %O2 Sat 48.7.  Imaging: Chest x-ray showed cardiomegaly and pulmonary vascular congestion without overt pulmonary edema  Patient was placed on BiPAP for acute on chronic hypoxic hypercapnic respiratory failure.  He was treated with IV Lasix, Solu-Medrol, DuoNebs and admitted to hospitalist service.  Patient was initially improving on BiPAP however on reassessment he was noted to be minimally responsive with snoring respirations.  Due to concerns for worsening hypercapnia and impending respiratory failure he was intubated for airway protection.    Significant Hospital Events   7/25: Admitted to hospitalist service w/acute on chronic hypoxic hypercapnic resp. failure requiring BiPAP.Failed BiPAP and intubated. PCCM consulted 7/26: INTUBATED, difficlut to sedate, started KETAMINE INFUSION 7/27: severe hypoxia, PEEP increased to 15 but decreased back to 10 7/28: Persistent severe hypoxia, ventilator changes made 7/29: Persistent hypoxia, recruitment maneuvers instituted, ventilator  changes made, empiric heparin as patient cannot be scanned query PE  Consults:  PCCM  Procedures:  7/25: Intubation  Significant Diagnostic Tests:  7/25: Chest Xray> Chest x-ray showed cardiomegaly and pulmonary vascular congestion without overt pulmonary edema 7/26: PICC triple-lumen, right basilic vein   Micro Data:  7/25: SARS-CoV-2 PCR> negative 7/25: MRSA PCR>> NEG 7/27: Strep pneumoniae Ag>> negative  Antimicrobials:  None   EVENTS OVERNIGHT Persistent severe hypoxia on 100% FiO2 Unable to wean from vent due to increased FiO2 requirement Difficult to sedate, becomes asynchronous with vent when sedation lightened Prognosis is guarded, family updated, they are upset Will need trach  REVIEW OF SYSTEMS Patient is unable to provide complete review of systems due to severe critical illness/ventilator dependence  OBJECTIVE  Blood pressure 131/75, pulse 96, temperature 99.4 F (37.4 C), temperature source Axillary, resp. rate 18, height 5' 9.02" (1.753 m), weight (!) 197 kg, SpO2 (!) 87 %. CVP:  [15 mmHg-17 mmHg] 17 mmHg   Vent Mode: PRVC FiO2 (%):  [100 %] 100 % Set Rate:  [20 bmp-24 bmp] 20 bmp Vt Set:  [570 mL] 570 mL PEEP:  [10 cmH20-15 cmH20] 14 cmH20 Plateau Pressure:  [32 cmH20] 32 cmH20   Intake/Output Summary (Last 24 hours) at 02/15/2022 0942 Last data filed at 02/15/2022 0900 Gross per 24 hour  Intake 3283.55 ml  Output 7000 ml  Net -3716.45 ml    Filed Weights   02/13/22 0422 02/14/22 0437 02/15/22 0417  Weight: (!) 203 kg (!) 200 kg (!) 197 kg   PHYSICAL EXAMINATION: GENERAL: Morbidly obese man, intubated, mechanically ventilated, sedated. HEAD: Normocephalic, atraumatic.  EYES: Pupils equal, round, reactive to light.  No scleral icterus.  MOUTH: Macroglossia (massive).  Orotracheally intubated, OG in place. NECK: Supple. No thyromegaly. Trachea   midline. No JVD.  No adenopathy. PULMONARY: Good air entry bilaterally.  Occasional rhonchi  noted. CARDIOVASCULAR: S1 and S2. Regular rate and rhythm.  ABDOMEN: Obese, soft, nondistended, tolerating tube MUSCULOSKELETAL: No joint deformity, no clubbing, no edema.  SCDs in place. NEUROLOGIC: Sedated, becomes agitated when sedation lightened, not following commands.  Moves all 4 spontaneously when agitated. SKIN: Intact,warm,dry. PSYCH: Not assessed due to mechanically ventilated status.  Labs/imaging that I havepersonally reviewed    Labs   CBC: Recent Labs  Lab 02/11/22 1029 02/12/22 0254 02/13/22 0411 02/14/22 0445 02/15/22 0352  WBC 7.7 11.1* 16.3* 14.1* 14.6*  HGB 13.2 12.8* 11.7* 10.9* 11.1*  HCT 44.1 42.5 39.2 36.3* 38.0*  MCV 89.3 88.9 88.7 88.3 90.9  PLT 198 229 197 174 180     Basic Metabolic Panel: Recent Labs  Lab 02/11/22 2218 02/12/22 0254 02/13/22 0411 02/13/22 1625 02/14/22 0445 02/15/22 0352  NA 133* 132* 135 137 138 145  K 4.6 4.5 4.4 4.5 4.3 4.0  CL 92* 91* 94* 97* 97* 104  CO2 _0 33* 33* 36*  GLUCOSE 299* 335* 273* 296* 316* 174*  BUN 14 17 36* 43* 47* 46*  CREATININE 1.32* 1.44* 1.68* 1.51* 1.34* 1.16  CALCIUM 8.3* 8.3* 8.2* 8.2* 8.2* 8.3*  MG 1.6*  --  2.1  --  2.4 2.4  PHOS  --   --  6.3*  --  4.9* 3.9    GFR: Estimated Creatinine Clearance: 139.3 mL/min (by C-G formula based on SCr of 1.16 mg/dL). Recent Labs  Lab 02/11/22 2218 02/12/22 0051 02/12/22 0254 02/12/22 0602 02/13/22 0411 02/13/22 1146 02/13/22 1625 02/14/22 0445 02/15/22 0352  PROCALCITON <0.10  --   --   --  <0.10  --   --   --   --   WBC  --   --  11.1*  --  16.3*  --   --  14.1* 14.6*  LATICACIDVEN 4.0* 4.1*  --  3.4*  --  2.4* 2.6*  --   --      Liver Function Tests: Recent Labs  Lab 02/11/22 2218  AST 23  ALT 20  ALKPHOS 69  BILITOT 0.8  PROT 7.2  ALBUMIN 3.5    No results for input(s): "LIPASE", "AMYLASE" in the last 168 hours. No results for input(s): "AMMONIA" in the last 168 hours.  ABG    Component Value Date/Time   PHART  7.37 02/15/2022 0453   PCO2ART 72 (HH) 02/15/2022 0453   PO2ART 67 (L) 02/15/2022 0453   HCO3 41.6 (H) 02/15/2022 0453   TCO2 26 03/17/2007 1822   O2SAT 93.4 02/15/2022 0453     Home Medications  Prior to Admission medications   Medication Sig Start Date End Date Taking? Authorizing Provider  losartan (COZAAR) 100 MG tablet Take 100 mg by mouth daily. 02/01/22  Yes [provider]  OZEMPIC, 0.25 OR 0.5 MG/DOSE, 2 MG/3ML SOPN SMARTSIG:0.25 Milligram(s) SUB-Q Once a Week 12/09/21  Yes [provider]  torsemide (DEMADEX) 20 MG tablet Take 20 mg by mouth daily. 12/18/21  Yes [provider]  Aspirin-Salicylamide-Caffeine (BC HEADACHE POWDER PO) Take 1 packet by mouth as needed (for pain).    [provider]  cephALEXin (KEFLEX) 500 MG capsule Take 1 capsule (500 mg total) by mouth 4 (four) times daily. Patient not taking: Reported on 02/11/2022 12/16/18   Davonna Belling, MD  cyclobenzaprine (FLEXERIL) 10 MG tablet Take 0.5-1 tablets (5-10 mg total) by mouth 2 (two) times  daily as needed for muscle spasms. Patient not taking: Reported on 02/11/2022 03/12/18   Harris, Abigail, PA-C  gabapentin (NEURONTIN) 600 MG tablet Take 600 mg by mouth at bedtime. Patient not taking: Reported on 02/11/2022 10/13/21   [provider]  LABETALOL HCL PO Take by mouth. Patient not taking: Reported on 02/11/2022    [provider]  meloxicam (MOBIC) 15 MG tablet Take 1 tablet (15 mg total) by mouth daily. Take 1 daily with food. Patient not taking: Reported on 02/11/2022 03/12/18   Harris, Abigail, PA-C  metFORMIN (GLUCOPHAGE) 500 MG tablet Take 500 mg by mouth 2 (two) times daily with a meal. Patient not taking: Reported on 02/11/2022    [provider]  METFORMIN HCL ER, MOD, PO Take by mouth. Patient not taking: Reported on 02/11/2022    [provider]  METOPROLOL SUCCINATE ER PO Take by mouth. Patient not taking: Reported on 02/11/2022     [provider]  oxyCODONE-acetaminophen (PERCOCET) 10-325 MG tablet Take 1 tablet by mouth See admin instructions. Take 1 tablet by mouth 4-5 times a day as needed for pain Patient not taking: Reported on 02/11/2022    [provider]  oxyCODONE-acetaminophen (PERCOCET/ROXICET) 5-325 MG tablet Take 1 tablet by mouth every 8 (eight) hours as needed for severe pain. Patient not taking: Reported on 02/11/2022 12/16/18   Pickering, Nathan, MD  predniSONE (DELTASONE) 20 MG tablet Take 2 tablets (40 mg total) by mouth daily. Patient not taking: Reported on 02/11/2022 04/04/15   Pickering, Nathan, MD   Hospital Scheduled Meds:  budesonide (PULMICORT) nebulizer solution  0.5 mg Nebulization BID   Chlorhexidine Gluconate Cloth  6 each Topical Q0600   diazepam  10 mg Per Tube Q6H   docusate  100 mg Per Tube BID   feeding supplement (PROSource TF)  90 mL Per Tube 5 X Daily   free water  30 mL Per Tube Q4H   insulin aspart  0-20 Units Subcutaneous Q4H   insulin aspart  6 Units Subcutaneous Q4H   insulin glargine-yfgn  26 Units Subcutaneous Daily   insulin starter kit- pen needles  1 kit Other Once   ipratropium-albuterol  3 mL Nebulization Q6H   living well with diabetes book   Does not apply Once   methylPREDNISolone (SOLU-MEDROL) injection  60 mg Intravenous Q12H   multivitamin  1 tablet Per Tube QHS   mouth rinse  15 mL Mouth Rinse Q2H   oxyCODONE  10 mg Per Tube Q6H   pantoprazole sodium  40 mg Per Tube QHS   polyethylene glycol  17 g Per Tube Daily   sodium chloride flush  10-40 mL Intracatheter Q12H   sodium chloride flush  3 mL Intravenous Q12H   Continuous Infusions:  sodium chloride     sodium chloride Stopped (02/11/22 2011)   ceFEPime (MAXIPIME) IV Stopped (02/15/22 0634)   feeding supplement (VITAL 1.5 CAL) 45 mL/hr at 02/15/22 0900   fentaNYL infusion INTRAVENOUS 200 mcg/hr (02/15/22 0900)   furosemide (LASIX) 200 mg in dextrose 5 % 100 mL (2 mg/mL) infusion 4 mg/hr  (02/15/22 0900)   ketamine (KETALAR) 2,500 mg in sodium chloride 0.9 % 500 mL (5 mg/mL) infusion 3 mg/kg/hr (02/15/22 0900)   midazolam 10 mg/hr (02/15/22 0900)   PRN Meds:.sodium chloride, acetaminophen **OR** acetaminophen, cyclobenzaprine, docusate sodium, fentaNYL, ondansetron **OR** ondansetron (ZOFRAN) IV, mouth rinse, oxyCODONE-acetaminophen, polyethylene glycol, sodium chloride flush, sodium chloride flush, traZODone, vecuronium  Active Hospital Problem list       Assessment & Plan:   46 yo morbidly obese AAM with severe decompensated acute systolic and diastolic heart failure with previously documented severe OSA/OHS, noncompliant with CPAP, active smoker, leading to severe hypoxic resp failure with encephalopathy and difficulty with sedation  Severe ACUTE Hypoxic and Hypercapnic Respiratory Failure Persistent hypoxemia despite trial of several ventilator strategies continue Mechanical Ventilator support Wean Fio2 and PEEP as tolerated VAP/VENT bundle implementation Wean PEEP & FiO2 as tolerated, maintain SpO2 > 88% Head of bed elevated 30 degrees, VAP protocol in place Plateau pressures less than 30 cm H20  Intermittent chest x-ray & ABG PRN Ensure adequate pulmonary hygiene  will NOT perform SAT/SBT due to high FiO2 requirements, asynchrony off sedation  Recruitment maneuvers every 4h and as needed. Persistent hypoxemia, patient over limit CT angio, AKI also precludes contrast dye,.  Heparin infusion, query PE Patient cannot be proned, also over weight limit for safe proning  Acute on chronic diastolic heart failure Cardiac restrictive physiology supported by biatrial enlargement Continue ventilator support Lasix infusion to be DC'd today, continue Lasix twice daily Creatinine 1. 68 today Diamox 500 mg x 1 Hold metoprolol and Losartan due to sedation related hypotension 2D Echocardiogram shows diastolic dysfunction and enlarged left and right atria consistent with  restrictive physiology  ACUTE KIDNEY INJURY/Renal Failure - improving -continue Foley catheter-assess need -Avoid nephrotoxic agents -Follow urine output, BMP -Ensure adequate renal perfusion, optimize oxygenation -Renal dose medications -Discussed with Dr. Candiss Norse, renal, they will sign off -Adding Diamox x1 today due to developing metabolic alkalosis -Lasix infusion switched to twice daily dosing   Intake/Output Summary (Last 24 hours) at 02/15/2022 0942 Last data filed at 02/15/2022 0900 Gross per 24 hour  Intake 3283.55 ml  Output 7000 ml  Net -3716.45 ml        Latest Ref Rng & Units 02/15/2022    3:52 AM 02/14/2022    4:45 AM 02/13/2022    4:25 PM  BMP  Glucose 70 - 99 mg/dL 174  316  296   BUN 6 - 20 mg/dL 46  47  43   Creatinine 0.61 - 1.24 mg/dL 1.16  1.34  1.51   Sodium 135 - 145 mmol/L 145  138  137   Potassium 3.5 - 5.1 mmol/L 4.0  4.3  4.5   Chloride 98 - 111 mmol/L 104  97  97   CO2 22 - 32 mmol/L 36  33  33   Calcium 8.9 - 10.3 mg/dL 8.3  8.2  8.2     ENDO - ICU hypoglycemic\Hyperglycemia protocol -check FSBS per protocol -Diabetes mellitus -HgbA1c  10.1 -CBG's AC & hs; Target range of 140 to 180 -SSI -Follow ICU Hypo/Hyperglycemia protocol -Hold Metformin  GI GI PROPHYLAXIS PPI  NUTRITIONAL STATUS DIET-->TF's as tolerated Constipation protocol as indicated  ACUTE ANEMIA- Transfuse as needed Consider transfusion  if hgb<7  Best practice:  Diet:  NPO Pain/Anxiety/Delirium protocol (if indicated): Yes (RASS goal 0) VAP protocol (if indicated): Yes DVT prophylaxis: Systemic AC GI prophylaxis: PPI Glucose control:  SSI Yes Central venous access:  N/A Arterial line:  N/A Foley:  Yes, and it is still needed Mobility:  bed rest  PT consulted: N/A Last date of multidisciplinary goals of care discussion [7/25] Code Status:  full code Disposition: ICU  CASE DISCUSSED IN [facility] ICU TEAM   The patient is critically ill  with multiple organ systems failure and requires high complexity decision making for assessment and support, frequent evaluation and titration of  therapies, application of advanced monitoring technologies and extensive interpretation of multiple databases. Critical Care Time devoted to patient care services described in this note is 40 minutes.   C. Laura Gonzalez, MD Advanced Bronchoscopy PCCM [facility]    *This note was dictated using voice recognition software/Dragon.  Despite best efforts to proofread, errors can occur which can change the meaning. Any transcriptional errors that result from this process are unintentional and may not be fully corrected at the time of dictation.      

## 2022-02-15 NOTE — Progress Notes (Signed)
[facility], [place] 02/15/22  Subjective:   Hospital day # 4  Patient remains critically ill intubated and sedated.  Daughter at bedside. Urine output of 6500 cc yesterday on Lasix drip.  Sedated with ketamine, fentanyl and midazolam.  Tube feeds at 45 cc/min.  FiO2 100 % Serum creatinine trends are improving as noted below.  Renal: 07/28 0701 - 07/29 0700 In: 3001.3 [I.V.:1877.1; NG/GT:824.3; IV Piggyback:300] Out: 6500 [Urine:6500] Lab Results  Component Value Date   CREATININE 1.16 02/15/2022   CREATININE 1.34 (H) 02/14/2022   CREATININE 1.51 (H) 02/13/2022     Objective:  Vital signs in last 24 hours:  Temp:  [98.6 F (37 C)-100.1 F (37.8 C)] 99.4 F (37.4 C) (07/29 0731) Pulse Rate:  [90-103] 96 (07/29 0613) Resp:  [18-27] 18 (07/29 8944) BP: (122-153)/(67-90) 135/75 (07/29 0613) SpO2:  [72 %-93 %] 87 % (07/29 0746) FiO2 (%):  [100 %] 100 % (07/29 0746) Weight:  [207 kg] 197 kg (07/29 0417)  Weight change: -3 kg Filed Weights   02/13/22 0422 02/14/22 0437 02/15/22 0417  Weight: (!) 203 kg (!) 200 kg (!) 197 kg    Intake/Output:    Intake/Output Summary (Last 24 hours) at 02/15/2022 0833 Last data filed at 02/15/2022 0800 Gross per 24 hour  Intake 3142.15 ml  Output 6700 ml  Net -3557.85 ml      Physical Exam: General: Critically ill-appearing, laying in the bed  HEENT ET tube, OG tube in place  Pulm/lungs Ventilator assisted, coarse breath sounds  CVS/Heart Regular  Abdomen:  Soft, nontender, nondistended  Extremities: Trace to 1+ dependent edema  Neurologic: Sedated  Skin: Warm, dry  Access:        Basic Metabolic Panel:  Recent Labs  Lab 02/11/22 2218 02/12/22 0254 02/13/22 0411 02/13/22 1625 02/14/22 0445 02/15/22 0352  NA 133* 132* 135 137 138 145  K 4.6 4.5 4.4 4.5 4.3 4.0  CL 92* 91* 94* 97* 97* 104  CO2 29 28 31  33* 33* 36*  GLUCOSE 299* 335* 273* 296* 316* 174*  BUN 14 17 36* 43* 47* 46*   CREATININE 1.32* 1.44* 1.68* 1.51* 1.34* 1.16  CALCIUM 8.3* 8.3* 8.2* 8.2* 8.2* 8.3*  MG 1.6*  --  2.1  --  2.4 2.4  PHOS  --   --  6.3*  --  4.9* 3.9      CBC: Recent Labs  Lab 02/11/22 1029 02/12/22 0254 02/13/22 0411 02/14/22 0445 02/15/22 0352  WBC 7.7 11.1* 16.3* 14.1* 14.6*  HGB 13.2 12.8* 11.7* 10.9* 11.1*  HCT 44.1 42.5 39.2 36.3* 38.0*  MCV 89.3 88.9 88.7 88.3 90.9  PLT 198 229 197 174 180      No results found for: "HEPBSAG", "HEPBSAB", "HEPBIGM"    Microbiology:  Recent Results (from the past 240 hour(s))  SARS Coronavirus 2 by RT PCR (hospital order, performed in [facility] hospital lab) *cepheid single result test* Anterior Nasal Swab     Status: None   Collection Time: 02/11/22  4:33 PM   Specimen: Anterior Nasal Swab  Result Value Ref Range Status   SARS Coronavirus 2 by RT PCR NEGATIVE NEGATIVE Final    Comment: (NOTE) SARS-CoV-2 target nucleic acids are NOT DETECTED.  The SARS-CoV-2 RNA is generally detectable in upper and lower respiratory specimens during the acute phase of infection. The lowest concentration of SARS-CoV-2 viral copies this assay can detect is 250 copies / mL. A negative result does not preclude SARS-CoV-2 infection  and should not be used as the sole basis for treatment or other patient management decisions.  A negative result may occur with improper specimen collection / handling, submission of specimen other than nasopharyngeal swab, presence of viral mutation(s) within the areas targeted by this assay, and inadequate number of viral copies (<250 copies / mL). A negative result must be combined with clinical observations, patient history, and epidemiological information.  Fact Sheet for Patients:   https://www.patel.info/  Fact Sheet for Healthcare Providers: https://hall.com/  This test is not yet approved or  cleared by the [place] FDA and has been authorized for  detection and/or diagnosis of SARS-CoV-2 by FDA under an Emergency Use Authorization (EUA).  This EUA will remain in effect (meaning this test can be used) for the duration of the COVID-19 declaration under Section 564(b)(1) of the Act, 21 U.S.C. section 360bbb-3(b)(1), unless the authorization is terminated or revoked sooner.  Performed at [facility], [address] [place], [place] [address]   MRSA Next Gen by PCR, Nasal     Status: None   Collection Time: 02/11/22  5:48 PM   Specimen: Nasal Mucosa; Nasal Swab  Result Value Ref Range Status   MRSA by PCR Next Gen NOT DETECTED NOT DETECTED Final    Comment: (NOTE) The GeneXpert MRSA Assay (FDA approved for NASAL specimens only), is one component of a comprehensive MRSA colonization surveillance program. It is not intended to diagnose MRSA infection nor to guide or monitor treatment for MRSA infections. Test performance is not FDA approved in patients less than 2 years old. Performed at [facility], [address] [place], [place] [address]   Culture, Respiratory w Gram Stain     Status: None (Preliminary result)   Collection Time: 02/13/22  9:16 PM   Specimen: Tracheal Aspirate; Respiratory  Result Value Ref Range Status   Specimen Description   Final    TRACHEAL ASPIRATE Performed at [facility], [address] [place], [place] [address]    Special Requests   Final    NONE Performed at [facility], [place], [place] [address]    Gram Stain   Final    NO SQUAMOUS EPITHELIAL CELLS SEEN FEW WBC SEEN FEW GRAM NEGATIVE RODS MODERATE GRAM POSITIVE COCCI Performed at [facility], [place] [address], [place], [place] [address]    Culture PENDING  Incomplete   Report Status PENDING  Incomplete    Coagulation Studies: No results for input(s): "LABPROT", "INR" in the last 72 hours.  Urinalysis: No results for input(s): "COLORURINE", "LABSPEC", "PHURINE",  "GLUCOSEU", "HGBUR", "BILIRUBINUR", "KETONESUR", "PROTEINUR", "UROBILINOGEN", "NITRITE", "LEUKOCYTESUR" in the last 72 hours.  Invalid input(s): "APPERANCEUR"    Imaging: DG Chest Port 1 View  Result Date: 02/15/2022 CLINICAL DATA:  Ventilator dependent respiratory failure. EXAM: PORTABLE CHEST 1 VIEW COMPARISON:  02/13/2022. FINDINGS: 5:07 a.m. ETT is 6.4 cm from the carina. Tip of the NGT is in the body of stomach. Right PICC terminates at the superior cavoatrial junction. Moderate cardiomegaly. Central vascular engorgement continues to be seen. Left-greater-than-right pleural effusions are noted with persistent dense opacity at the retrocardiac area consistent with left lower lobe atelectasis, consolidation or combination. There is increased left upper lobe linear atelectasis. Hazy opacity right infrahilar area. Remaining lungs are clear with no other significant opacities. Stable mediastinum. IMPRESSION: 1. Cardiomegaly with continued central vascular prominence. No overt edema findings. 2. Left-greater-than-right lower zonal opacities and pleural effusions. 3. Increased linear atelectasis left  upper lobe. Electronically Signed   By: Telford Nab M.D.   On: 02/15/2022 07:23   DG Chest 1 View  Result Date: 02/13/2022 CLINICAL DATA:  Mucous plugging the respiratory tract. EXAM: CHEST  1 VIEW COMPARISON:  February 13, 2022 FINDINGS: An endotracheal tube is seen with its distal tip proximally 7.6 cm from the carina. A nasogastric tube is seen with its distal end extending below the level of the diaphragm. The cardiac silhouette is enlarged and unchanged in size. There is mild to moderate severity prominence of the perihilar pulmonary vasculature. Mild bibasilar airspace disease is seen. There is no evidence of a pleural effusion or pneumothorax. The visualized skeletal structures are unremarkable. IMPRESSION: 1. Cardiomegaly with mild to moderate severity pulmonary vascular congestion. 2. Mild bibasilar  airspace disease which may represent atelectasis and/or infiltrate. Electronically Signed   By: Virgina Norfolk M.D.   On: 02/13/2022 20:03     Medications:    sodium chloride     sodium chloride Stopped (02/11/22 2011)   ceFEPime (MAXIPIME) IV Stopped (02/15/22 1751)   feeding supplement (VITAL 1.5 CAL) 45 mL/hr at 02/15/22 0800   fentaNYL infusion INTRAVENOUS 200 mcg/hr (02/15/22 0830)   furosemide (LASIX) 200 mg in dextrose 5 % 100 mL (2 mg/mL) infusion 4 mg/hr (02/15/22 0800)   ketamine (KETALAR) 2,500 mg in sodium chloride 0.9 % 500 mL (5 mg/mL) infusion 3 mg/kg/hr (02/15/22 0800)   midazolam 10 mg/hr (02/15/22 0800)    budesonide (PULMICORT) nebulizer solution  0.5 mg Nebulization BID   Chlorhexidine Gluconate Cloth  6 each Topical Q0600   diazepam  10 mg Per Tube Q6H   docusate  100 mg Per Tube BID   feeding supplement (PROSource TF)  90 mL Per Tube 5 X Daily   free water  30 mL Per Tube Q4H   insulin aspart  0-20 Units Subcutaneous Q4H   insulin aspart  6 Units Subcutaneous Q4H   insulin glargine-yfgn  26 Units Subcutaneous Daily   insulin starter kit- pen needles  1 kit Other Once   ipratropium-albuterol  3 mL Nebulization Q6H   living well with diabetes book   Does not apply Once   methylPREDNISolone (SOLU-MEDROL) injection  60 mg Intravenous Q12H   multivitamin  1 tablet Per Tube QHS   mouth rinse  15 mL Mouth Rinse Q2H   oxyCODONE  10 mg Per Tube Q6H   pantoprazole sodium  40 mg Per Tube QHS   polyethylene glycol  17 g Per Tube Daily   sodium chloride flush  10-40 mL Intracatheter Q12H   sodium chloride flush  3 mL Intravenous Q12H   sodium chloride, acetaminophen **OR** acetaminophen, cyclobenzaprine, docusate sodium, fentaNYL, ondansetron **OR** ondansetron (ZOFRAN) IV, mouth rinse, oxyCODONE-acetaminophen, polyethylene glycol, sodium chloride flush, sodium chloride flush, traZODone, vecuronium  Assessment/ Plan:  46 y.o. male with medical problems of poorly  controlled diabetes, hypertension, systolic CHF, obstructive sleep apnea, obesity    admitted on 02/11/2022 for Acute respiratory failure (HCC) [J96.00] Respiratory failure (HCC) [J96.90] Peripheral edema [R60.9] Acute respiratory failure with hypoxia and hypercapnia (HCC) [J96.01, J96.02]  Acute kidney injury.  Baseline creatinine 0.96 from February 11, 2022. Secondary to hemodynamic compromise caused by cardiorenal syndrome.  At present he is responding well to IV furosemide infusion.  Urine output of about 6500 cc yesterday.  Serum creatinine trend is improving.  Continue to monitor.  No acute indication for dialysis. Renal function has recovered and creatinine is back to normal.  We will  sign off.  Please reconsult as necessary.  Acute respiratory failure Ventilator assisted.  Management as per ICU team.  Still requiring high dose of FiO2.  Acute exacerbation of systolic and diastolic CHF 2D echo from February 11, 2022-LVEF 55 to 60%, moderate LVH, grade 1 diastolic dysfunction, enlarged right ventricle, moderately dilated right atrium, moderately dilated left atrium,  Poorly controlled diabetes type 2  Lab Results  Component Value Date   HGBA1C 10.1 (H) 02/11/2022      LOS: [place] 7/29/20238:33 AM  [place], [place]  Note: This note was prepared with Dragon dictation. Any transcription errors are unintentional

## 2022-02-15 NOTE — Consult Note (Signed)
  ANTICOAGULATION CONSULT NOTE - Initial Consult  Pharmacy Consult for Heparin infusion Indication:  concern for possible PE  Allergies  Allergen Reactions   Hydrocodone Swelling    Patient Measurements: Height: 5' 9.02" (175.3 cm) Weight: (!) 197 kg (434 lb 4.9 oz) IBW/kg (Calculated) : 70.74 Heparin Dosing Weight: 121 kg  Vital Signs: Temp: 100 F (37.8 C) (07/29 1145) Temp Source: Axillary (07/29 1145) BP: 127/75 (07/29 1300) Pulse Rate: 92 (07/29 1300)  Labs: Recent Labs    02/13/22 0411 02/13/22 1625 02/14/22 0445 02/15/22 0352  HGB 11.7*  --  10.9* 11.1*  HCT 39.2  --  36.3* 38.0*  PLT 197  --  174 180  CREATININE 1.68* 1.51* 1.34* 1.16    Estimated Creatinine Clearance: 139.3 mL/min (by C-G formula based on SCr of 1.16 mg/dL).   Medical History: Past Medical History:  Diagnosis Date   Diabetes mellitus without complication ([place])    Hypertension    Pancreatitis     Medications:  No history of chronic anticoagulant use PTA  Assessment: Pharmacy has been consulted to initiate and monitor heparin in 46yo patient with severe acute hypoxic and hypercapnic respiratory failure despite trial of several ventilator strategies. Patient was receiving lovenox 0.5mg /kg Q24 hours during hospitalization. Will now transition to heparin infusion  Goal of Therapy:  Heparin level 0.3-0.7 units/ml Monitor platelets by anticoagulation protocol: Yes   Plan:  Give 7500 units bolus x 1 Start heparin infusion at 2000 units/hr Check anti-Xa level in 6 hours and daily while on heparin Continue to monitor H&H and platelets  [name] A [name] 02/15/2022,2:05 PM

## 2022-02-15 NOTE — Consult Note (Signed)
PHARMACY CONSULT NOTE  Pharmacy Consult for Electrolyte Monitoring and Replacement   Recent Labs: Potassium (mmol/L)  Date Value  02/15/2022 4.0   Magnesium (mg/dL)  Date Value  02/15/2022 2.4   Calcium (mg/dL)  Date Value  02/15/2022 8.3 (L)   Albumin (g/dL)  Date Value  02/11/2022 3.5   Phosphorus (mg/dL)  Date Value  02/15/2022 3.9   Sodium (mmol/L)  Date Value  02/15/2022 145   Assessment: Patient is a 46 y/o M with medical history including DM, HTN, pancreatitis, tobacco use disorder, systolic CHF, OSA, DM c/b diabetic neuropathy, lumbar radiculopathy, morbid obesity who is admitted with acute respiratory failure in setting of acute CHF and hypertensive urgency. Patient is currently intubated, sedated, and on mechanical ventilation in the ICU. Pharmacy consulted to assist with electrolyte monitoring and replacement as indicated.  Nutrition: Trickle feeds started 7/26 Diuretics: Lasix gtt at 4 mg/hr + metolazone 2.5 mg per tube 7/27 x 1  Goal of Therapy:  Electrolytes within normal limits  Plan:  --No electrolyte replacement indicated at this time --Follow-up electrolytes with AM labs tomorrow  Pearla Dubonnet 02/15/2022 8:12 AM

## 2022-02-15 NOTE — Consult Note (Signed)
  ANTICOAGULATION CONSULT NOTE   Pharmacy Consult for Heparin infusion Indication:  concern for possible PE  Allergies  Allergen Reactions   Hydrocodone Swelling    Patient Measurements: Height: 5' 9.02" (175.3 cm) Weight: (!) 197 kg (434 lb 4.9 oz) IBW/kg (Calculated) : 70.74 Heparin Dosing Weight: 121 kg  Vital Signs: Temp: 99.8 F (37.7 C) (07/29 2100) Temp Source: Oral (07/29 2100) BP: 123/75 (07/29 2100) Pulse Rate: 95 (07/29 2100)  Labs: Recent Labs    02/13/22 0411 02/13/22 1625 02/14/22 0445 02/15/22 0352 02/15/22 2048  HGB 11.7*  --  10.9* 11.1*  --   HCT 39.2  --  36.3* 38.0*  --   PLT 197  --  174 180  --   HEPARINUNFRC  --   --   --   --  0.62  CREATININE 1.68* 1.51* 1.34* 1.16  --      Estimated Creatinine Clearance: 139.3 mL/min (by C-G formula based on SCr of 1.16 mg/dL).   Medical History: Past Medical History:  Diagnosis Date   Diabetes mellitus without complication ([place])    Hypertension    Pancreatitis     Medications:  No history of chronic anticoagulant use PTA  Assessment: Pharmacy has been consulted to initiate and monitor heparin in 46yo patient with severe acute hypoxic and hypercapnic respiratory failure despite trial of several ventilator strategies. Patient was receiving lovenox 0.5mg /kg Q24 hours during hospitalization. Will now transition to heparin infusion  7/29 20:48    HL 0.62, therapeutic x 1  Goal of Therapy:  Heparin level 0.3-0.7 units/ml Monitor platelets by anticoagulation protocol: Yes   Plan:  Continue Heparin infusion at current rate of 2000 units/hr Recheck HL in 6 hours for confirmation Daily CBC while on Heparin infusion  Paulina Fusi, PharmD, BCPS 02/15/2022 9:18 PM

## 2022-02-16 ENCOUNTER — Inpatient Hospital Stay
Admit: 2022-02-16 | Discharge: 2022-02-16 | Disposition: A | Discharge status: Home / Self Care | Payer: Medicaid Other | Attending: Pulmonary Disease | Admitting: Pulmonary Disease

## 2022-02-16 ENCOUNTER — Inpatient Hospital Stay: Payer: Medicaid Other

## 2022-02-16 DIAGNOSIS — J9601 Acute respiratory failure with hypoxia: Secondary | ICD-10-CM | POA: Diagnosis not present

## 2022-02-16 DIAGNOSIS — J9602 Acute respiratory failure with hypercapnia: Secondary | ICD-10-CM | POA: Diagnosis not present

## 2022-02-16 LAB — CBC WITH DIFFERENTIAL/PLATELET
Abs Immature Granulocytes: 0.07 10*3/uL (ref 0.00–0.07)
Basophils Absolute: 0 10*3/uL (ref 0.0–0.1)
Basophils Relative: 0 %
Eosinophils Absolute: 0.1 10*3/uL (ref 0.0–0.5)
Eosinophils Relative: 1 %
HCT: 34.6 % — ABNORMAL LOW (ref 39.0–52.0)
Hemoglobin: 9.7 g/dL — ABNORMAL LOW (ref 13.0–17.0)
Immature Granulocytes: 1 %
Lymphocytes Relative: 13 %
Lymphs Abs: 1.6 10*3/uL (ref 0.7–4.0)
MCH: 26.3 pg (ref 26.0–34.0)
MCHC: 28 g/dL — ABNORMAL LOW (ref 30.0–36.0)
MCV: 93.8 fL (ref 80.0–100.0)
Monocytes Absolute: 1.7 10*3/uL — ABNORMAL HIGH (ref 0.1–1.0)
Monocytes Relative: 14 %
Neutro Abs: 8.7 10*3/uL — ABNORMAL HIGH (ref 1.7–7.7)
Neutrophils Relative %: 71 %
Platelets: 154 10*3/uL (ref 150–400)
RBC: 3.69 MIL/uL — ABNORMAL LOW (ref 4.22–5.81)
RDW: 15.4 % (ref 11.5–15.5)
WBC: 12.1 10*3/uL — ABNORMAL HIGH (ref 4.0–10.5)
nRBC: 0 % (ref 0.0–0.2)

## 2022-02-16 LAB — BLOOD GAS, ARTERIAL
Acid-Base Excess: 10.5 mmol/L — ABNORMAL HIGH (ref 0.0–2.0)
Bicarbonate: 39.4 mmol/L — ABNORMAL HIGH (ref 20.0–28.0)
FIO2: 100 %
MECHVT: 570 mL
Mechanical Rate: 20
O2 Saturation: 92 %
PEEP: 14 cmH2O
Patient temperature: 37
pCO2 arterial: 73 mmHg (ref 32–48)
pH, Arterial: 7.34 — ABNORMAL LOW (ref 7.35–7.45)
pO2, Arterial: 66 mmHg — ABNORMAL LOW (ref 83–108)

## 2022-02-16 LAB — ECHOCARDIOGRAM LIMITED
Area-P 1/2: 4.8 cm2
Calc EF: 40.2 %
Height: 69.016 in
S' Lateral: 4.75 cm
Single Plane A2C EF: 40.5 %
Single Plane A4C EF: 40.1 %
Weight: 7019.45 oz

## 2022-02-16 LAB — GLUCOSE, CAPILLARY
Glucose-Capillary: 104 mg/dL — ABNORMAL HIGH (ref 70–99)
Glucose-Capillary: 144 mg/dL — ABNORMAL HIGH (ref 70–99)
Glucose-Capillary: 160 mg/dL — ABNORMAL HIGH (ref 70–99)
Glucose-Capillary: 146 mg/dL — ABNORMAL HIGH (ref 70–99)
Glucose-Capillary: 133 mg/dL — ABNORMAL HIGH (ref 70–99)
Glucose-Capillary: 158 mg/dL — ABNORMAL HIGH (ref 70–99)

## 2022-02-16 LAB — BASIC METABOLIC PANEL
Anion gap: 2 — ABNORMAL LOW (ref 5–15)
BUN: 38 mg/dL — ABNORMAL HIGH (ref 6–20)
CO2: 31 mmol/L (ref 22–32)
Calcium: 7.7 mg/dL — ABNORMAL LOW (ref 8.9–10.3)
Chloride: 112 mmol/L — ABNORMAL HIGH (ref 98–111)
Creatinine, Ser: 0.9 mg/dL (ref 0.61–1.24)
GFR, Estimated: >60 mL/min (ref >60)
Glucose, Bld: 95 mg/dL (ref 70–99)
Potassium: 3.5 mmol/L (ref 3.5–5.1)
Sodium: 145 mmol/L (ref 135–145)

## 2022-02-16 LAB — COOXEMETRY PANEL
Carboxyhemoglobin: 1.7 % — ABNORMAL HIGH (ref 0.5–1.5)
Methemoglobin: 0.9 % (ref 0.0–1.5)
O2 Saturation: 79.8 %
Total hemoglobin: 10.2 g/dL — ABNORMAL LOW (ref 12.0–16.0)
Total oxygen content: 77.8 %

## 2022-02-16 LAB — MAGNESIUM: Magnesium: 2.1 mg/dL (ref 1.7–2.4)

## 2022-02-16 LAB — PHOSPHORUS: Phosphorus: 2.4 mg/dL — ABNORMAL LOW (ref 2.5–4.6)

## 2022-02-16 LAB — HEPARIN LEVEL (UNFRACTIONATED): Heparin Unfractionated: 0.56 IU/mL (ref 0.30–0.70)

## 2022-02-16 MED ORDER — MIDAZOLAM-SODIUM CHLORIDE 100-0.9 MG/100ML-% IV SOLN: 2.0000 mg/h | INTRAVENOUS | Status: DC

## 2022-02-16 MED ORDER — POTASSIUM CHLORIDE 20 MEQ PO PACK
40.0000 meq | PACK | Freq: Once | ORAL | Status: AC
  Administered 2022-02-16: 40 meq
  Filled 2022-02-16: qty 2

## 2022-02-16 MED ORDER — MILRINONE LACTATE IN DEXTROSE 20-5 MG/100ML-% IV SOLN
0.3750 ug/kg/min | INTRAVENOUS | Status: DC
  Administered 2022-02-16: 0.375 ug/kg/min via INTRAVENOUS
  Filled 2022-02-16: qty 100

## 2022-02-16 MED ORDER — VECURONIUM BOLUS VIA INFUSION
0.0800 mg/kg | Freq: Once | INTRAVENOUS | Status: AC
  Administered 2022-02-16: 15.8 mg via INTRAVENOUS
  Filled 2022-02-16: qty 16

## 2022-02-16 MED ORDER — POTASSIUM & SODIUM PHOSPHATES 280-160-250 MG PO PACK
2.0000 | PACK | Freq: Three times a day (TID) | ORAL | Status: AC
  Administered 2022-02-16 (×3): 2
  Filled 2022-02-16 (×3): qty 2

## 2022-02-16 MED ORDER — OXYCODONE-ACETAMINOPHEN 5-325 MG PO TABS: 1.0000 | ORAL_TABLET | Freq: Three times a day (TID) | ORAL | Status: DC | PRN

## 2022-02-16 MED ORDER — PERFLUTREN LIPID MICROSPHERE
1.0000 mL | INTRAVENOUS | Status: AC | PRN
  Administered 2022-02-16: 6 mL via INTRAVENOUS

## 2022-02-16 MED ORDER — ARTIFICIAL TEARS OPHTHALMIC OINT
1.0000 | TOPICAL_OINTMENT | Freq: Three times a day (TID) | OPHTHALMIC | Status: DC
  Administered 2022-02-16 – 2022-03-01 (×41): 1 via OPHTHALMIC
  Filled 2022-02-16 (×2): qty 3.5

## 2022-02-16 MED ORDER — VANCOMYCIN HCL 2000 MG/400ML IV SOLN
2000.0000 mg | Freq: Two times a day (BID) | INTRAVENOUS | Status: DC
Start: 2022-02-16 — End: 2022-02-17
  Administered 2022-02-16 – 2022-02-17 (×2): 2000 mg via INTRAVENOUS
  Filled 2022-02-16 (×2): qty 400

## 2022-02-16 MED ORDER — SODIUM CHLORIDE 0.9 % IV SOLN
0.5000 mg/kg/h | INTRAVENOUS | Status: DC
  Administered 2022-02-16 – 2022-02-18 (×5): 3 mg/kg/h via INTRAVENOUS
  Administered 2022-02-18: 2.9 mg/kg/h via INTRAVENOUS
  Administered 2022-02-19: 2.2 mg/kg/h via INTRAVENOUS
  Administered 2022-02-19: 2.9 mg/kg/h via INTRAVENOUS
  Administered 2022-02-19: 3 mg/kg/h via INTRAVENOUS
  Administered 2022-02-19: 2.9 mg/kg/h via INTRAVENOUS
  Administered 2022-02-20: 0.5 mg/kg/h via INTRAVENOUS
  Filled 2022-02-16: qty 250
  Filled 2022-02-16: qty 240
  Filled 2022-02-16 (×8): qty 250

## 2022-02-16 MED ORDER — TRAZODONE HCL 50 MG PO TABS: 25.0000 mg | ORAL_TABLET | Freq: Every evening | ORAL | Status: DC | PRN

## 2022-02-16 MED ORDER — VECURONIUM BROMIDE 10 MG IV SOLR
0.0000 ug/kg/min | Status: DC
  Administered 2022-02-16 – 2022-02-17 (×4): 1 ug/kg/min via INTRAVENOUS
  Filled 2022-02-16 (×6): qty 100

## 2022-02-16 MED ORDER — MILRINONE LACTATE IN DEXTROSE 20-5 MG/100ML-% IV SOLN
0.2500 ug/kg/min | INTRAVENOUS | Status: DC
  Administered 2022-02-16 (×3): 0.25 ug/kg/min via INTRAVENOUS
  Filled 2022-02-16 (×2): qty 100

## 2022-02-16 MED ORDER — ACETAMINOPHEN 10 MG/ML IV SOLN
1000.0000 mg | Freq: Once | INTRAVENOUS | Status: AC
  Administered 2022-02-16: 1000 mg via INTRAVENOUS
  Filled 2022-02-16: qty 100

## 2022-02-16 MED ORDER — FENTANYL 2500MCG IN NS 250ML (10MCG/ML) PREMIX INFUSION: 50.0000 ug/h | INTRAVENOUS | Status: DC

## 2022-02-16 NOTE — Consult Note (Signed)
  ANTICOAGULATION CONSULT NOTE   Pharmacy Consult for Heparin infusion Indication:  concern for possible PE  Allergies  Allergen Reactions   Hydrocodone Swelling    Patient Measurements: Height: 5' 9.02" (175.3 cm) Weight: (!) 197 kg (434 lb 4.9 oz) IBW/kg (Calculated) : 70.74 Heparin Dosing Weight: 121 kg  Vital Signs: Temp: 100.7 F (38.2 C) (07/30 0430) Temp Source: Oral (07/30 0430) BP: 112/72 (07/30 0430) Pulse Rate: 90 (07/30 0430)  Labs: Recent Labs    02/14/22 0445 02/15/22 0352 02/15/22 2048 02/16/22 0301  HGB 10.9* 11.1*  --  9.7*  HCT 36.3* 38.0*  --  34.6*  PLT 174 180  --  154  HEPARINUNFRC  --   --  0.62 0.56  CREATININE 1.34* 1.16  --  0.90     Estimated Creatinine Clearance: 179.6 mL/min (by C-G formula based on SCr of 0.9 mg/dL).   Medical History: Past Medical History:  Diagnosis Date   Diabetes mellitus without complication ([place])    Hypertension    Pancreatitis     Medications:  No history of chronic anticoagulant use PTA  Assessment: Pharmacy has been consulted to initiate and monitor heparin in 46yo patient with severe acute hypoxic and hypercapnic respiratory failure despite trial of several ventilator strategies. Patient was receiving lovenox 0.5mg /kg Q24 hours during hospitalization. Will now transition to heparin infusion  7/29 20:48    HL 0.62, therapeutic x 1 7/30  3:01     HL 0.90, therapeutic x2  Goal of Therapy:  Heparin level 0.3-0.7 units/ml Monitor platelets by anticoagulation protocol: Yes   Plan:  Continue Heparin infusion at current rate of 2000 units/hr Recheck HL tomorrow with AM labs Daily CBC while on Heparin infusion  Darrick Penna Clinical Pharmacist 02/16/2022 4:56 AM

## 2022-02-16 NOTE — Consult Note (Signed)
PHARMACY CONSULT NOTE  Pharmacy Consult for Electrolyte Monitoring and Replacement   Recent Labs: Potassium (mmol/L)  Date Value  02/16/2022 3.5   Magnesium (mg/dL)  Date Value  02/16/2022 2.1   Calcium (mg/dL)  Date Value  02/16/2022 7.7 (L)   Albumin (g/dL)  Date Value  02/11/2022 3.5   Phosphorus (mg/dL)  Date Value  02/16/2022 2.4 (L)   Sodium (mmol/L)  Date Value  02/16/2022 145   Assessment: Patient is a 46 y/o M with medical history including DM, HTN, pancreatitis, tobacco use disorder, systolic CHF, OSA, DM c/b diabetic neuropathy, lumbar radiculopathy, morbid obesity who is admitted with acute respiratory failure in setting of acute CHF and hypertensive urgency. Patient is currently intubated, sedated, and on mechanical ventilation in the ICU. Pharmacy consulted to assist with electrolyte monitoring and replacement as indicated.  Nutrition: Trickle feeds started 7/26 Diuretics: Lasix gtt at 4 mg/hr + metolazone 2.5 mg per tube 7/27 x 1  Goal of Therapy:  Electrolytes within normal limits  Plan:  --K 3.5, Phos 2.4, KCL 2mEq PO x 1 and PhosNaK x 3 total doses ordered --Follow-up electrolytes with AM labs tomorrow  Pearla Dubonnet 02/16/2022 7:52 AM

## 2022-02-16 NOTE — Consult Note (Signed)
Pharmacy Antibiotic Note  Samuel Chavez is a 46 y.o. male admitted on 02/11/2022 with acute respiratory failure. Pharmacy has been consulted for cefepime and vancomycin dosing for PNA.  7/25 MRSA PCR negative  02/16/22: Day 4 antibiotics. Patient spiking fever 102.4  Plan: Continue cefepime 2 gram Q8H Initiate Vancomycin 2000 mg Q12H. Goal AUC 400-550 Estimated AUC 440/ Scr 0.9, IBW, Vd 0.5 (BMI 64)    Height: 5' 9.02" (175.3 cm) Weight: (!) 199 kg (438 lb 11.5 oz) IBW/kg (Calculated) : 70.74  Temp (24hrs), Avg:101 F (38.3 C), Min:99.8 F (37.7 C), Max:102.4 F (39.1 C)  Recent Labs  Lab 02/11/22 2218 02/12/22 0051 02/12/22 0254 02/12/22 0602 02/13/22 0411 02/13/22 1146 02/13/22 1625 02/14/22 0445 02/15/22 0352 02/16/22 0301  WBC  --   --  11.1*  --  16.3*  --   --  14.1* 14.6* 12.1*  CREATININE 1.32*  --  1.44*  --  1.68*  --  1.51* 1.34* 1.16 0.90  LATICACIDVEN 4.0* 4.1*  --  3.4*  --  2.4* 2.6*  --   --   --      Estimated Creatinine Clearance: 180.7 mL/min (by C-G formula based on SCr of 0.9 mg/dL).    Allergies  Allergen Reactions   Hydrocodone Swelling    Antimicrobials this admission: 7/27 cefepime >>  7/27 Vancomycin >> 7/28 7/30: Vancomycin >>  Dose adjustments this admission:   Microbiology results: 7/30: sputum Cx: sent 7/27 Sputum: Strep Pnu[place], H. influenzae 7/25 MRSA PCR: neg  Thank you for allowing pharmacy to be a part of this patient's care.  Dorothe Pea, PharmD, BCPS Clinical Pharmacist   02/16/2022 4:58 PM

## 2022-02-16 NOTE — Progress Notes (Signed)
NAME:  Samuel Chavez, MRN:  [id number], DOB:  Nov 03, 1975, LOS: 5 ADMISSION DATE:  02/11/2022, CONSULTATION DATE: 02/11/22 REFERRING MD:  Eugenie Norrie MD  CHIEF COMPLAINT: SOB   CC  follow up RESP FAILURE/acute CHF exacerbation  HPI/SYNOPSIS  46 y.o male with significant PMH as below who presented to the ED with chief complaints of SOB and worsening bilateral lower extremities edema.  ED Course: In the emergency department, the temperature was 37.3C, the heart rate 99 beats/minute, the blood pressure 187/102 mm Hg, the respiratory rate 20 breaths/minute, and the oxygen saturation 89% on. He was noted to be drowsy but still protecting his airway and responding appropriately.  Pertinent Labs/Diagnostics Findings: Chemistry:Glucose:319 Calcium: 8.8 otherwise unremarkable CBC: unremarkable Other Lab findings:  COVID PCR: Negative, BNP: pending, Venous Blood Gas result:  pO2 pending; pCO2 87; pH 7.27;  HCO3 40, %O2 Sat 48.7.  Imaging: Chest x-ray showed cardiomegaly and pulmonary vascular congestion without overt pulmonary edema  Patient was placed on BiPAP for acute on chronic hypoxic hypercapnic respiratory failure.  He was treated with IV Lasix, Solu-Medrol, DuoNebs and admitted to hospitalist service.  Patient was initially improving on BiPAP however on reassessment he was noted to be minimally responsive with snoring respirations.  Due to concerns for worsening hypercapnia and impending respiratory failure he was intubated for airway protection.    Significant Hospital Events   7/25: Admitted to hospitalist service w/acute on chronic hypoxic hypercapnic resp. failure requiring BiPAP.Failed BiPAP and intubated. PCCM consulted 7/26: INTUBATED, difficlut to sedate, started [place] 7/27: severe hypoxia, PEEP increased to 15 but decreased back to 10 7/28: Persistent severe hypoxia, ventilator changes made 7/29: Persistent hypoxia, recruitment maneuvers instituted, ventilator  changes made, empiric heparin as patient cannot be scanned query PE  Consults:  PCCM  Procedures:  7/25: Intubation 7/26: PICC triple-lumen, right basilic vein  Significant Diagnostic Tests:  7/25  Chest Xray:Chest x-ray showed cardiomegaly and pulmonary vascular congestion without overt pulmonary edema 7/25 Echocardiogram: difficult study, LVEF was estimated at 55 to 60%, dilated LV moderate LVH, grade 1 DD, enlargement of the right ventricle, left atrial size moderately dilated, right atrial size moderately dilated this is consistent with restrictive physiology (echo reading not congruous with prior echoes from [facility]) 7/30 Limited echo: LVEF 40 to 45% decreased LV function, no wall motion abnormalities, moderate dilation of the LV concentric LVH, right ventricular size moderately enlarged, right atrial enlargement, this is more consistent with prior [facility] scans  Micro Data:  7/25: SARS-CoV-2 PCR> negative 7/25: MRSA PCR>> NEG 7/27: Strep pneumoniae Ag>> negative 7/27: Legionella Ag>> 7/27: Mycoplasma>> 7/27: Sputum>> Strep pneumo,Haemophilus influenzae 7/30 Sputum cult >>  Antimicrobials:  7/27: Maxipime >> 7/27: Vancomycin >> 7/29 7/30: Vancomycin (resumed due to fever spike on Maxipime) ? Resistant Strep pneumo   EVENTS OVERNIGHT Persistent severe hypoxia on 100% FiO2 Unable to wean from vent due to increased FiO2 requirement Difficult to sedate, becomes asynchronous with vent when sedation lightened, required lytic infusion overnight Prognosis is guarded, family updated (daughter in room) Will need trach if FiO2 can be decreased to be able to perform  REVIEW OF SYSTEMS Patient is unable to provide complete review of systems due to severe critical illness/ventilator dependence  OBJECTIVE  Blood pressure 120/68, pulse (!) 103, temperature (!) 100.9 F (38.3 C), temperature source Axillary, resp. rate 20, height 5' 9.02" (1.753 m), weight (!) 199  kg, SpO2 (!) 89 %. CVP:  [9 mmHg-14 mmHg] 9 mmHg   Vent  Mode: PRVC FiO2 (%):  [100 %] 100 % Set Rate:  [20 bmp] 20 bmp Vt Set:  [570 mL] 570 mL PEEP:  [14 cmH20] 14 cmH20 Plateau Pressure:  [29 cmH20-30 cmH20] 30 cmH20   Intake/Output Summary (Last 24 hours) at 02/16/2022 1139 Last data filed at 02/16/2022 1100 Gross per 24 hour  Intake 5417.62 ml  Output 5490 ml  Net -72.38 ml    Filed Weights   02/14/22 0437 02/15/22 0417 02/16/22 0500  Weight: (!) 200 kg (!) 197 kg (!) 199 kg   PHYSICAL EXAMINATION: GENERAL: Morbidly obese man, intubated, mechanically ventilated, sedated.  Malodorous mouth secretions. HEAD: Normocephalic, atraumatic.  EYES: Pupils equal, round, reactive to light.  No scleral icterus.  MOUTH: Macroglossia (massive).  Orotracheally intubated, OG in place. NECK: Supple. No thyromegaly. Trachea midline. No JVD.  No adenopathy. PULMONARY: Good air entry bilaterally.  Occasional rhonchi noted. CARDIOVASCULAR: S1 and S2.  Sinus tach.  ABDOMEN: Obese, soft, nondistended, tolerating tube feeds. MUSCULOSKELETAL: No joint deformity, no clubbing, no edema.  SCDs in place. NEUROLOGIC: Sedated, required paralytic last night, is on infusion, cannot make any further assessment.   SKIN: Intact,warm,dry.  Chronic stasis changes in the lower extremities. PSYCH: Not assessed due to mechanically ventilated status.  Labs/imaging that I havepersonally reviewed    Labs   CBC: Recent Labs  Lab 02/12/22 0254 02/13/22 0411 02/14/22 0445 02/15/22 0352 02/16/22 0301  WBC 11.1* 16.3* 14.1* 14.6* 12.1*  NEUTROABS  --   --   --   --  8.7*  HGB 12.8* 11.7* 10.9* 11.1* 9.7*  HCT 42.5 39.2 36.3* 38.0* 34.6*  MCV 88.9 88.7 88.3 90.9 93.8  PLT 229 197 174 180 154     Basic Metabolic Panel: Recent Labs  Lab 02/11/22 2218 02/12/22 0254 02/13/22 0411 02/13/22 1625 02/14/22 0445 02/15/22 0352 02/16/22 0301  NA 133*   < > 135 137 138 145 145  K 4.6   < > 4.4 4.5 4.3 4.0 3.5   CL 92*   < > 94* 97* 97* 104 112*  CO2 29   < > 31 33* 33* 36* 31  GLUCOSE 299*   < > 273* 296* 316* 174* 95  BUN 14   < > 36* 43* 47* 46* 38*  CREATININE 1.32*   < > 1.68* 1.51* 1.34* 1.16 0.90  CALCIUM 8.3*   < > 8.2* 8.2* 8.2* 8.3* 7.7*  MG 1.6*  --  2.1  --  2.4 2.4 2.1  PHOS  --   --  6.3*  --  4.9* 3.9 2.4*   < > = values in this interval not displayed.    GFR: Estimated Creatinine Clearance: 180.7 mL/min (by C-G formula based on SCr of 0.9 mg/dL). Recent Labs  Lab 02/11/22 2218 02/12/22 0051 02/12/22 0254 02/12/22 0602 02/13/22 0411 02/13/22 1146 02/13/22 1625 02/14/22 0445 02/15/22 0352 02/16/22 0301  PROCALCITON <0.10  --   --   --  <0.10  --   --   --   --   --   WBC  --   --    < >  --  16.3*  --   --  14.1* 14.6* 12.1*  LATICACIDVEN 4.0* 4.1*  --  3.4*  --  2.4* 2.6*  --   --   --    < > = values in this interval not displayed.     Liver Function Tests: Recent Labs  Lab 02/11/22 2218  AST 23  ALT 20  ALKPHOS 69  BILITOT 0.8  PROT 7.2  ALBUMIN 3.5    ABG    Component Value Date/Time   PHART 7.34 (L) 02/16/2022 0545   PCO2ART 73 (HH) 02/16/2022 0545   PO2ART 66 (L) 02/16/2022 0545   HCO3 39.4 (H) 02/16/2022 0545   TCO2 26 03/17/2007 1822   O2SAT 92 02/16/2022 0545     Home Medications  Prior to Admission medications   Medication Sig Start Date End Date Taking? Authorizing Provider  losartan (COZAAR) 100 MG tablet Take 100 mg by mouth daily. 02/01/22  Yes [provider]  OZEMPIC, 0.25 OR 0.5 MG/DOSE, 2 MG/3ML SOPN SMARTSIG:0.25 Milligram(s) SUB-Q Once a Week 12/09/21  Yes [provider]  torsemide (DEMADEX) 20 MG tablet Take 20 mg by mouth daily. 12/18/21  Yes [provider]  Aspirin-Salicylamide-Caffeine (BC HEADACHE POWDER PO) Take 1 packet by mouth as needed (for pain).    [provider]  cephALEXin (KEFLEX) 500 MG capsule Take 1 capsule (500 mg total) by mouth 4 (four) times daily. Patient not taking:  Reported on 02/11/2022 12/16/18   Davonna Belling, MD  cyclobenzaprine (FLEXERIL) 10 MG tablet Take 0.5-1 tablets (5-10 mg total) by mouth 2 (two) times daily as needed for muscle spasms. Patient not taking: Reported on 02/11/2022 03/12/18   Margarita Mail, PA-C  gabapentin (NEURONTIN) 600 MG tablet Take 600 mg by mouth at bedtime. Patient not taking: Reported on 02/11/2022 10/13/21   [provider]  LABETALOL HCL PO Take by mouth. Patient not taking: Reported on 02/11/2022    [provider]  meloxicam (MOBIC) 15 MG tablet Take 1 tablet (15 mg total) by mouth daily. Take 1 daily with food. Patient not taking: Reported on 02/11/2022 03/12/18   Margarita Mail, PA-C  metFORMIN (GLUCOPHAGE) 500 MG tablet Take 500 mg by mouth 2 (two) times daily with a meal. Patient not taking: Reported on 02/11/2022    [provider]  METFORMIN HCL ER, MOD, PO Take by mouth. Patient not taking: Reported on 02/11/2022    [provider]  METOPROLOL SUCCINATE ER PO Take by mouth. Patient not taking: Reported on 02/11/2022    [provider]  oxyCODONE-acetaminophen (PERCOCET) 10-325 MG tablet Take 1 tablet by mouth See admin instructions. Take 1 tablet by mouth 4-5 times a day as needed for pain Patient not taking: Reported on 02/11/2022    [provider]  oxyCODONE-acetaminophen (PERCOCET/ROXICET) 5-325 MG tablet Take 1 tablet by mouth every 8 (eight) hours as needed for severe pain. Patient not taking: Reported on 02/11/2022 12/16/18   Davonna Belling, MD  predniSONE (DELTASONE) 20 MG tablet Take 2 tablets (40 mg total) by mouth daily. Patient not taking: Reported on 02/11/2022 04/04/15   Davonna Belling, MD   Hospital Scheduled Meds:  artificial tears  1 Application Both Eyes Q1J   budesonide (PULMICORT) nebulizer solution  0.5 mg Nebulization BID   Chlorhexidine Gluconate Cloth  6 each Topical Q0600   docusate  100 mg Per Tube BID   feeding supplement  (PROSource TF)  90 mL Per Tube 5 X Daily   free water  30 mL Per Tube Q4H   furosemide  40 mg Intravenous BID   insulin aspart  0-20 Units Subcutaneous Q4H   insulin aspart  8 Units Subcutaneous Q4H   insulin glargine-yfgn  26 Units Subcutaneous Daily   insulin starter kit- pen needles  1 kit Other Once   ipratropium-albuterol  3 mL Nebulization Q6H   living well  with diabetes book   Does not apply Once   multivitamin  1 tablet Per Tube QHS   mouth rinse  15 mL Mouth Rinse Q2H   oxyCODONE  10 mg Per Tube Q6H   pantoprazole sodium  40 mg Per Tube QHS   polyethylene glycol  17 g Per Tube Daily   potassium & sodium phosphates  2 packet Per Tube TID WC & HS   sodium chloride flush  10-40 mL Intracatheter Q12H   sodium chloride flush  3 mL Intravenous Q12H   Continuous Infusions:  sodium chloride     sodium chloride Stopped (02/11/22 2011)   ceFEPime (MAXIPIME) IV Stopped (02/16/22 0654)   feeding supplement (VITAL 1.5 CAL) 45 mL/hr at 02/16/22 1100   fentaNYL infusion INTRAVENOUS 300 mcg/hr (02/16/22 1100)   heparin 2,000 Units/hr (02/16/22 1100)   ketamine (KETALAR) 2,500 mg in sodium chloride 0.9 % 500 mL (5 mg/mL) infusion 3 mg/kg/hr (02/16/22 1100)   midazolam 10 mg/hr (02/16/22 1100)   vecuronium (NORCURON) infusion 100mg /183mL (1 mg/mL) 1 mcg/kg/min (02/16/22 1100)   PRN Meds:.sodium chloride, acetaminophen **OR** acetaminophen, cyclobenzaprine, docusate sodium, fentaNYL, midazolam, ondansetron **OR** ondansetron (ZOFRAN) IV, mouth rinse, oxyCODONE-acetaminophen, polyethylene glycol, sodium chloride flush, sodium chloride flush, traZODone, vecuronium      Active Hospital Problem list    Patient Active Problem List   Diagnosis Date Noted   Acute respiratory failure ([place]) 02/11/2022   Respiratory failure ([place]) 02/11/2022   Acute respiratory failure with hypoxia and hypercarbia ([facility]) 02/11/2022   Acute on chronic systolic CHF (congestive heart failure) ([place]) 02/11/2022    Hypertensive urgency 02/11/2022   Type 2 diabetes mellitus with peripheral neuropathy ([place]) 02/11/2022   OBSTRUCTIVE SLEEP APNEA 04/30/2009    Assessment & Plan:   46 yo morbidly obese AAM with severe decompensated acute systolic and diastolic heart failure with previously documented severe OSA/OHS, noncompliant with CPAP, active smoker, leading to severe hypoxic resp failure with encephalopathy and difficulty with sedation  Severe acute on chronic hypoxic and hypercapnic respiratory failure due to CAP and acute decompensation of chronic heart failure Persistent hypoxemia despite trial of several ventilator strategies continue Mechanical Ventilator support Wean Fio2 and PEEP as tolerated Continue VAP/VENT bundles  Wean PEEP & FiO2 as tolerated, maintain SpO2 > 88% Head of bed elevated 30 degrees, VAP protocol in place Plateau pressures less than 30 cm H20 as able Intermittent chest x-ray & ABG PRN Ensure adequate pulmonary hygiene  will NOT perform SAT/SBT due to high FiO2 requirements, asynchrony off sedation  Requiring paralytic infusion for ventilator synchrony Recruitment maneuvers every 4h and as needed. On heparin infusion as cannot exclude PE, however no DVT on LE Dopplers assuring, Patient cannot be proned, due to weight limit for safe proning and unstable airway (ET tube with constant need of repositioning)  Community-acquired pneumonia: Streptococcus pneumoniae and Haemophilus influenzae Fever spike today Sputum re-cultured On cefepime Added vancomycin, query resistant Streptococcus  Acute on chronic systolic and diastolic heart failure LVEF 40 to 45% consistent with prior studies performed at [facility] ventilator support Lasix twice daily IV Trial of milrinone Creatinine 0.9 today Diamox 500 mg x 1 Hold metoprolol and Losartan due to sedation related hypotension 2D Echocardiogram shows diastolic dysfunction and enlarged left and right atria  consistent with restrictive physiology  ACUTE KIDNEY INJURY/Renal Failure -continues to improve Element of cardiorenal syndrome -continue Foley catheter-assess need -Avoid nephrotoxic agents -Follow urine output, BMP -Ensure adequate renal perfusion, optimize oxygenation -Renal dose medications -Nephrology has  signed off -On Lasix twice daily -Added milrinone   Intake/Output Summary (Last 24 hours) at 02/16/2022 1139 Last data filed at 02/16/2022 1100 Gross per 24 hour  Intake 5417.62 ml  Output 5490 ml  Net -72.38 ml        Latest Ref Rng & Units 02/16/2022    3:01 AM 02/15/2022    3:52 AM 02/14/2022    4:45 AM  BMP  Glucose 70 - 99 mg/dL 95  174  316   BUN 6 - 20 mg/dL 38  46  47   Creatinine 0.61 - 1.24 mg/dL 0.90  1.16  1.34   Sodium 135 - 145 mmol/L 145  145  138   Potassium 3.5 - 5.1 mmol/L 3.5  4.0  4.3   Chloride 98 - 111 mmol/L 112  104  97   CO2 22 - 32 mmol/L 31  36  33   Calcium 8.9 - 10.3 mg/dL 7.7  8.3  8.2     ENDO - ICU hypoglycemic\Hyperglycemia protocol -check FSBS per protocol -Diabetes mellitus -HgbA1c  10.1 -CBG's AC & hs; Target range of 140 to 180 -SSI -Follow ICU Hypo/Hyperglycemia protocol -Hold Metformin  GI GI PROPHYLAXIS PPI  NUTRITIONAL STATUS DIET-->TF's as tolerated Constipation protocol as indicated  ACUTE ANEMIA- Transfuse as needed Consider transfusion  if hgb<7  Best practice:  Diet: Tube feeds Pain/Anxiety/Delirium protocol (if indicated): Yes (RASS goal 0) VAP protocol (if indicated): Yes DVT prophylaxis: Systemic AC GI prophylaxis: PPI Glucose control:  SSI Yes Central venous access:  N/A Arterial line:  N/A Foley:  Yes, and it is still needed Mobility:  bed rest  PT consulted: N/A Last date of multidisciplinary goals of care discussion [7/25] Code Status:  full code Disposition: ICU   The patient is critically ill with multiple organ systems failure and requires high complexity decision making for  assessment and support, frequent evaluation and titration of therapies, application of advanced monitoring technologies and extensive interpretation of multiple databases. Critical Care Time devoted to patient care services described in this note is 45 minutes.  Updated patient's daughter at bedside.  Patient's prognosis is exceedingly guarded.  We will place palliative care consultation to help with establishing goals of care for the patient and to assist family with decision making.   Renold Don, MD Advanced Bronchoscopy PCCM [facility]    *This note was dictated using voice recognition software/Dragon.  Despite best efforts to proofread, errors can occur which can change the meaning. Any transcriptional errors that result from this process are unintentional and may not be fully corrected at the time of dictation.

## 2022-02-16 NOTE — Progress Notes (Signed)
Acute Hypoxic Respiratory Failure Called bedside multiple times in shift as patient asynchronous with ventilator, peak pressures at 49, stacking breaths, hypoxic as low as 50%. Vecuronium IVP provided multiple times during day and night shifts.  Decision made to provide continuous vecuronium drip with TOF & BIS monitoring to better control oxygenation and ventilation. Will continue fentanyl, versed & ketamine drips.  Daughter updated bedside.    Domingo Pulse Rust[place], AGACNP-BC Acute Care Nurse Practitioner [place] [facility]   [phone] / [phone] Please see Amion for pager details.

## 2022-02-17 ENCOUNTER — Inpatient Hospital Stay: Payer: Medicaid Other

## 2022-02-17 LAB — CBC WITH DIFFERENTIAL/PLATELET
Abs Immature Granulocytes: 0.1 10*3/uL — ABNORMAL HIGH (ref 0.00–0.07)
Basophils Absolute: 0 10*3/uL (ref 0.0–0.1)
Basophils Relative: 0 %
Eosinophils Absolute: 0.2 10*3/uL (ref 0.0–0.5)
Eosinophils Relative: 2 %
HCT: 34.7 % — ABNORMAL LOW (ref 39.0–52.0)
Hemoglobin: 9.5 g/dL — ABNORMAL LOW (ref 13.0–17.0)
Immature Granulocytes: 1 %
Lymphocytes Relative: 6 %
Lymphs Abs: 0.8 10*3/uL (ref 0.7–4.0)
MCH: 27 pg (ref 26.0–34.0)
MCHC: 27.4 g/dL — ABNORMAL LOW (ref 30.0–36.0)
MCV: 98.6 fL (ref 80.0–100.0)
Monocytes Absolute: 1.8 10*3/uL — ABNORMAL HIGH (ref 0.1–1.0)
Monocytes Relative: 13 %
Neutro Abs: 11.6 10*3/uL — ABNORMAL HIGH (ref 1.7–7.7)
Neutrophils Relative %: 78 %
Platelets: 134 10*3/uL — ABNORMAL LOW (ref 150–400)
RBC: 3.52 MIL/uL — ABNORMAL LOW (ref 4.22–5.81)
RDW: 15.8 % — ABNORMAL HIGH (ref 11.5–15.5)
WBC: 14.6 10*3/uL — ABNORMAL HIGH (ref 4.0–10.5)
nRBC: 0 % (ref 0.0–0.2)

## 2022-02-17 LAB — BLOOD GAS, ARTERIAL
Acid-Base Excess: 5.5 mmol/L — ABNORMAL HIGH (ref 0.0–2.0)
Acid-Base Excess: 8.4 mmol/L — ABNORMAL HIGH (ref 0.0–2.0)
Bicarbonate: 35 mmol/L — ABNORMAL HIGH (ref 20.0–28.0)
Bicarbonate: 37.1 mmol/L — ABNORMAL HIGH (ref 20.0–28.0)
Drawn by: 51910
FIO2: 100 %
MECHVT: 570 mL
MECHVT: 500 mL
Mechanical Rate: 30
O2 Saturation: 72.7 %
O2 Saturation: 85.8 %
PEEP: 14 cmH2O
PEEP: 18 cmH2O
Patient temperature: 37
Patient temperature: 37
RATE: 20 resp/min
pCO2 arterial: 78 mmHg (ref 32–48)
pCO2 arterial: 72 mmHg (ref 32–48)
pH, Arterial: 7.26 — ABNORMAL LOW (ref 7.35–7.45)
pH, Arterial: 7.32 — ABNORMAL LOW (ref 7.35–7.45)
pO2, Arterial: 48 mmHg — ABNORMAL LOW (ref 83–108)
pO2, Arterial: 55 mmHg — ABNORMAL LOW (ref 83–108)

## 2022-02-17 LAB — CULTURE, RESPIRATORY W GRAM STAIN: Gram Stain: NONE SEEN

## 2022-02-17 LAB — BASIC METABOLIC PANEL
Anion gap: 6 (ref 5–15)
BUN: 36 mg/dL — ABNORMAL HIGH (ref 6–20)
CO2: 27 mmol/L (ref 22–32)
Calcium: 6.8 mg/dL — ABNORMAL LOW (ref 8.9–10.3)
Chloride: 112 mmol/L — ABNORMAL HIGH (ref 98–111)
Creatinine, Ser: 0.84 mg/dL (ref 0.61–1.24)
GFR, Estimated: >60 mL/min (ref >60)
Glucose, Bld: 181 mg/dL — ABNORMAL HIGH (ref 70–99)
Potassium: 3.9 mmol/L (ref 3.5–5.1)
Sodium: 145 mmol/L (ref 135–145)

## 2022-02-17 LAB — LEGIONELLA PNEUMOPHILA SEROGP 1 UR AG: L. pneumophila Serogp 1 Ur Ag: NEGATIVE

## 2022-02-17 LAB — PHOSPHORUS: Phosphorus: 4.6 mg/dL (ref 2.5–4.6)

## 2022-02-17 LAB — GLUCOSE, CAPILLARY
Glucose-Capillary: 186 mg/dL — ABNORMAL HIGH (ref 70–99)
Glucose-Capillary: 188 mg/dL — ABNORMAL HIGH (ref 70–99)
Glucose-Capillary: 178 mg/dL — ABNORMAL HIGH (ref 70–99)
Glucose-Capillary: 176 mg/dL — ABNORMAL HIGH (ref 70–99)
Glucose-Capillary: 162 mg/dL — ABNORMAL HIGH (ref 70–99)
Glucose-Capillary: 159 mg/dL — ABNORMAL HIGH (ref 70–99)

## 2022-02-17 LAB — HEPARIN LEVEL (UNFRACTIONATED): Heparin Unfractionated: 0.4 IU/mL (ref 0.30–0.70)

## 2022-02-17 LAB — HEPARIN ANTI-XA: Heparin LMW: 0.67 IU/mL

## 2022-02-17 LAB — MYCOPLASMA PNEUMONIAE ANTIBODY, IGM: Mycoplasma pneumo IgM: <770 U/mL (ref 0–769)

## 2022-02-17 LAB — MAGNESIUM: Magnesium: 2.1 mg/dL (ref 1.7–2.4)

## 2022-02-17 MED ORDER — SENNOSIDES-DOCUSATE SODIUM 8.6-50 MG PO TABS
1.0000 | ORAL_TABLET | Freq: Two times a day (BID) | ORAL | Status: DC
  Administered 2022-02-17 – 2022-03-02 (×19): 1
  Filled 2022-02-17 (×22): qty 1

## 2022-02-17 MED ORDER — PHENOBARBITAL 32.4 MG PO TABS
97.2000 mg | ORAL_TABLET | Freq: Three times a day (TID) | ORAL | Status: DC
Start: 2022-02-17 — End: 2022-02-26
  Administered 2022-02-17 – 2022-02-25 (×26): 97.2 mg
  Filled 2022-02-17 (×26): qty 3

## 2022-02-17 MED ORDER — VECURONIUM BROMIDE 10 MG IV SOLR
0.0000 ug/kg/min | Status: DC
  Administered 2022-02-17: 0.5 ug/kg/min via INTRAVENOUS
  Administered 2022-02-17: 0.7 ug/kg/min via INTRAVENOUS
  Filled 2022-02-17: qty 100

## 2022-02-17 MED ORDER — IBUPROFEN 100 MG/5ML PO SUSP
600.0000 mg | Freq: Once | ORAL | Status: AC
Start: 2022-02-17 — End: 2022-02-17
  Administered 2022-02-17: 600 mg
  Filled 2022-02-17 (×2): qty 30

## 2022-02-17 MED ORDER — CEFTRIAXONE SODIUM 2 G IJ SOLR
2.0000 g | Freq: Two times a day (BID) | INTRAMUSCULAR | Status: AC
  Administered 2022-02-17 – 2022-02-21 (×10): 2 g via INTRAVENOUS
  Filled 2022-02-17 (×5): qty 2
  Filled 2022-02-17 (×2): qty 20
  Filled 2022-02-17 (×3): qty 2

## 2022-02-17 MED ORDER — ENOXAPARIN SODIUM 300 MG/3ML IJ SOLN
1.0000 mg/kg | Freq: Two times a day (BID) | INTRAMUSCULAR | Status: DC
  Administered 2022-02-17 (×2): 195 mg via SUBCUTANEOUS
  Filled 2022-02-17 (×6): qty 1.95

## 2022-02-17 MED ORDER — METOLAZONE 5 MG PO TABS
5.0000 mg | ORAL_TABLET | Freq: Once | ORAL | Status: AC
  Administered 2022-02-17: 5 mg
  Filled 2022-02-17: qty 1

## 2022-02-17 MED ORDER — IBUPROFEN 100 MG/5ML PO SUSP: 600.0000 mg | Freq: Once | ORAL | Status: DC

## 2022-02-17 MED ORDER — FUROSEMIDE 10 MG/ML IJ SOLN
40.0000 mg | Freq: Two times a day (BID) | INTRAMUSCULAR | Status: DC
Start: 2022-02-17 — End: 2022-02-18
  Administered 2022-02-17 (×2): 40 mg via INTRAVENOUS
  Filled 2022-02-17: qty 4

## 2022-02-17 MED ORDER — IBUPROFEN 100 MG/5ML PO SUSP
600.0000 mg | Freq: Once | ORAL | Status: AC
Start: 2022-02-17 — End: 2022-02-17
  Administered 2022-02-17: 600 mg
  Filled 2022-02-17: qty 30

## 2022-02-17 NOTE — Procedures (Signed)
Arterial Catheter Insertion Procedure Note  Samuel Chavez  [id number]  08/15/1975  Date:02/17/22  Time:10:23 AM    Provider Performing: Teressa Lower    Procedure: Insertion of Arterial Line [phone]) with US guidance (17001)   Indication(s) Blood pressure monitoring and/or need for frequent ABGs  Consent Risks of the procedure as well as the alternatives and risks of each were explained to the patient and/or caregiver.  Consent for the procedure was obtained and is signed in the bedside chart  Anesthesia None   Time Out Verified patient identification, verified procedure, site/side was marked, verified correct patient position, special equipment/implants available, medications/allergies/relevant history reviewed, required imaging and test results available.   Sterile Technique Maximal sterile technique including full sterile barrier drape, hand hygiene, sterile gown, sterile gloves, mask, hair covering, sterile ultrasound probe cover (if used).   Procedure Description Area of catheter insertion was cleaned with chlorhexidine and draped in sterile fashion. With real-time ultrasound guidance an arterial catheter was placed into the left radial artery.  Appropriate arterial tracings confirmed on monitor.     Complications/Tolerance None; patient tolerated the procedure well.   EBL Minimal   Specimen(s) None  Samuel Chavez, AGNP  Pulmonary/Critical Care Pager [phone] (please enter 7 digits) PCCM Consult Pager [phone] (please enter 7 digits)

## 2022-02-17 NOTE — Progress Notes (Signed)
Recruitment Maneuver done    PCV 40 ,RR 10,PEEP +5, 100%, I:E 1:1  X 2 MIN         Note Details  Samuel Chavez, RRT File Time 02/17/2022  7:59 PM  Author Type Respiratory Therapist Status Signed  Last Editor Pryor Montes, RRT Specialty Respiratory [place] # [id number] Admit Date 02/11/2022

## 2022-02-17 NOTE — Progress Notes (Signed)
Severe ARDS  Patient continues to require high ventilator settings without improvement. Overnight on 100% FiO2 and a PEEP of 14, SpO2 fluctuating between 85-97%. Around 3 am, SpO2 readings dropped fluctuating between 50-79%. ABG drawn at that time deteriorated revealing respiratory acidosis and a worsened PF ratio of 48. Discussed case with Dr. Tamala Julian at [facility]. Ventilator settings adjusted with PEEP increased to 16 then 18, TV dropped to 500, RR increased to 30. On these settings minute ventilation increased to 14, peak pressures in the low 40's. SpO2 remains in the 50's. CXR shows R lung completely white, LLL also shows worsening aeration. Bedside lung [place] technically difficult due to positioning and habitus, but lung visualized most consistent with increased interstitial fluid. Secretions clear/ white and frothy.  Repeat ABG consistent with previous except PO2 has dropped from 48 to 40 - AM 40 mg of Lasix given early - patient positioned on R side to improve oxygenation - repeat ABG at 8 am  Daughter updated bedside to critical deterioration and overall grave prognosis. Discussed that at this time the patient is at a [place] for cardiac arrest. All questions and concerns answered.  Additional CC time 30 minutes  Venetia Night, AGACNP-BC Acute Care Nurse Practitioner [place] [facility]   [phone] / [phone] Please see Amion for pager details.

## 2022-02-17 NOTE — Progress Notes (Signed)
Pt began decompensating around 3am, O2 stats went down into the 70s while obtaining ABG, lowest stats being in the 50s. His HR increased from 110s to the 120s and milrinone was stopped per NP. Temp also began increasing from 38 celsius to 38.6 celsius. RT, NP and This RN were at bedside, vent setting were adjust based on ABG results  Latest Reference Range & Units 02/17/22 03:05  Mode  PRESSURE REGULATED VOLUME CONTROL  pH, Arterial 7.35 - 7.45  7.26 (L)  pCO2 arterial 32 - 48 mmHg 78 (HH)  pO2, Arterial 83 - 108 mmHg 48 (L)  Acid-Base Excess 0.0 - 2.0 mmol/L 5.5 (H)  Bicarbonate 20.0 - 28.0 mmol/L 35.0 (H)  O2 Saturation % 72.7  Patient temperature  37.0  Collection site  RIGHT RADIAL  [place] test (pass/fail) PASS  PASS  [facility]): Data is critically high (L): Data is abnormally low (H): Data is abnormally high  CXR was obtained and NP attempted bedside ultrasound, 40 mg of IV lasix was given and pt was turned on right side after xray revealed poor ventilation of right lung. Daughter is at bedside at this time and Elink was also notified by NP. Tylenol was given for increasing temp.

## 2022-02-17 NOTE — Progress Notes (Signed)
Nutrition Follow Up Note   DOCUMENTATION CODES:   Morbid obesity  INTERVENTION:   Continue Vital 1.5 _0 /hr + ProSource TF 104m five times daily via tube  Free water flushes 337mq4 hours to maintain tube patency   Regimen provides 2020kcal/day, 183g/day protein and 100566may of free water   NUTRITION DIAGNOSIS:   Inadequate oral intake related to inability to eat (pt sedated and ventilated) as evidenced by NPO status.  GOAL:   Provide needs based on ASPEN/SCCM guidelines -met   MONITOR:   Vent status, Labs, Weight trends, TF tolerance, Skin, I & O's  ASSESSMENT:   [age]o male with h/o COPD, CHF, HTN, DM and OSA who is admitted with CHF exacerbation, CAP and severe ARDS.  Pt remains sedated and ventilated. OGT in place. Pt tolerating tube feeds at goal rate. Refeed labs stable. Will discontinue rena-vit as pt did not require CRRT. Per chart, pt is up ~56lbs from admission. Pt -8.3L on his I & Os. Pt is being diuresed. Plan is for possible thoracentesis. No BM since admission; NP notified.   Medications reviewed and include: colace, lovenox, lasix, insulin, rena-vit, oxycodone, protonix, miralax, ceftriaxone  Labs reviewed: Na 145 wnl, K 3.9 wnl, BUN 36(H), P 4.6 wnl, Mg 2.1 wnl Wbc- 14.6(H), Hgb 9.5(L), Hct 34.7(L) Cbgs- 178, 188, 186 x 24 hrs  Patient is currently intubated on ventilator support MV: 14.7 L/min Temp (24hrs), Avg:101.2 F (38.4 C), Min:100.4 F (38 C), Max:102.4 F (39.1 C)  Propofol: none   MAP- >26m57m  UOP- [id number] Order:   Diet Order             Diet NPO time specified  Diet effective now                  EDUCATION NEEDS:   Not appropriate for education at this time  Skin:  Skin Assessment: Reviewed RN Assessment  Last BM:  pta  Height:   Ht Readings from Last 1 Encounters:  02/13/22 5' 9.02" (1.753 m)    Weight:   Wt Readings from Last 1 Encounters:  02/17/22 (!) 196 kg    Ideal Body Weight:  72.7  kg  BMI:  Body mass index is 63.78 kg/m.  Estimated Nutritional Needs:   Kcal:  1600-1800kcal/day  Protein:  182g/day protein  Fluid:  2.0L/day  CaseyKoleen DistanceRD, LDN Please refer to AMIONPorterville Developmental CenterRD and/or RD on-call/weekend/after hours pager

## 2022-02-17 NOTE — Progress Notes (Signed)
NAME:  Samuel Chavez, MRN:  [id number], DOB:  Nov 30, 1975, LOS: 6 ADMISSION DATE:  02/11/2022, CONSULTATION DATE: 02/11/22 REFERRING MD:  Eugenie Norrie MD  CHIEF COMPLAINT: SOB   CC  follow up RESP FAILURE/acute CHF exacerbation  HPI/SYNOPSIS  46 y.o male with significant PMH as below who presented to the ED with chief complaints of SOB and worsening bilateral lower extremities edema.  ED Course: In the emergency department, the temperature was 37.3C, the heart rate 99 beats/minute, the blood pressure 187/102 mm Hg, the respiratory rate 20 breaths/minute, and the oxygen saturation 89% on. He was noted to be drowsy but still protecting his airway and responding appropriately.  Pertinent INITIAL Labs/Diagnostics Findings: Chemistry:Glucose:319 Calcium: 8.8 otherwise unremarkable CBC: unremarkable Other Lab findings: ABG with hypoxemia and hypercapnia, .  Imaging: Chest x-ray showed bilateral infiltrates worse on right  -02/17/22- patient is severely critically ill with bilateral multifocal pneumonia on sedation and paralysis with mechanical ventilation maximal settings.  He is at cusp of death, we met with daughter today and discussed severity of illness. Unable to perform CT due to severity of critical illness. Met with previous PCCM doc and discussed medical plan.  Patient had recruitment maneuvers last few days without improvement, on heparin gtt for possible PE.   Significant Hospital Events   7/25: Admitted to hospitalist service w/acute on chronic hypoxic hypercapnic resp. failure requiring BiPAP.Failed BiPAP and intubated. PCCM consulted 7/26: INTUBATED, difficlut to sedate, started George 7/27: severe hypoxia, PEEP increased to 15 but decreased back to 10 7/28: Persistent severe hypoxia, ventilator changes made 7/29: Persistent hypoxia, recruitment maneuvers instituted, ventilator changes made, empiric heparin as patient cannot be scanned query PE  Consults:   PCCM  Procedures:  7/25: Intubation 7/26: PICC triple-lumen, right basilic vein  Significant Diagnostic Tests:  7/25  Chest Xray:Chest x-ray showed cardiomegaly and pulmonary vascular congestion without overt pulmonary edema 7/25 Echocardiogram: difficult study, LVEF was estimated at 55 to 60%, dilated LV moderate LVH, grade 1 DD, enlargement of the right ventricle, left atrial size moderately dilated, right atrial size moderately dilated this is consistent with restrictive physiology (echo reading not congruous with prior echoes from [facility]) 7/30 Limited echo: LVEF 40 to 45% decreased LV function, no wall motion abnormalities, moderate dilation of the LV concentric LVH, right ventricular size moderately enlarged, right atrial enlargement, this is more consistent with prior [facility] scans  Micro Data:  7/25: SARS-CoV-2 PCR> negative 7/25: MRSA PCR>> NEG 7/27: Strep pneumoniae Ag>> negative 7/27: Legionella Ag>> 7/27: Mycoplasma>> 7/27: Sputum>> Strep pneumo,Haemophilus influenzae 7/30 Sputum cult >>  Antimicrobials:  7/27: Maxipime >> 7/27: Vancomycin >> 7/29 7/30: Vancomycin (resumed due to fever spike on Maxipime) ? Resistant Strep pneumo   REVIEW OF SYSTEMS Patient is unable to provide complete review of systems due to severe critical illness/ventilator dependence  OBJECTIVE  Blood pressure 137/90, pulse (!) 124, temperature (!) 101.5 F (38.6 C), temperature source Esophageal, resp. rate (!) 30, height 5' 9.02" (1.753 m), weight (!) 196 kg, SpO2 (!) 57 %. CVP:  [9 mmHg-31 mmHg] 31 mmHg   Vent Mode: PRVC FiO2 (%):  [100 %] 100 % Set Rate:  [20 bmp-30 bmp] 30 bmp Vt Set:  [500 mL-570 mL] 500 mL PEEP:  [14 cmH20-16 cmH20] 16 cmH20 Plateau Pressure:  [30 cmH20-38 cmH20] 38 cmH20   Intake/Output Summary (Last 24 hours) at 02/17/2022 0727 Last data filed at 02/17/2022 0551 Gross per 24 hour  Intake 5141.25 ml  Output 6925 ml  Net -1783.75 ml     Filed Weights   02/15/22 0417 02/16/22 0500 02/17/22 0410  Weight: (!) 197 kg (!) 199 kg (!) 196 kg   PHYSICAL EXAMINATION: GENERAL: Morbidly obese man, intubated, mechanically ventilated, sedated.  Malodorous mouth secretions. HEAD: Normocephalic, atraumatic.  EYES: Pupils equal, round, reactive to light.  No scleral icterus.  MOUTH: Macroglossia (massive).  Orotracheally intubated, OG in place. NECK: Supple. No thyromegaly. Trachea midline. No JVD.  No adenopathy. PULMONARY: Good air entry bilaterally.  Occasional rhonchi noted. CARDIOVASCULAR: S1 and S2.  Sinus tach.  ABDOMEN: Obese, soft, nondistended, tolerating tube feeds. MUSCULOSKELETAL: No joint deformity, no clubbing, no edema.  SCDs in place. NEUROLOGIC: Sedated, required paralytic last night, is on infusion, cannot make any further assessment.   SKIN: Intact,warm,dry.  Chronic stasis changes in the lower extremities. PSYCH: Not assessed due to mechanically ventilated status.  Labs/imaging that I havepersonally reviewed    Labs   CBC: Recent Labs  Lab 02/13/22 0411 02/14/22 0445 02/15/22 0352 02/16/22 0301 02/17/22 0305  WBC 16.3* 14.1* 14.6* 12.1* 14.6*  NEUTROABS  --   --   --  8.7* 11.6*  HGB 11.7* 10.9* 11.1* 9.7* 9.5*  HCT 39.2 36.3* 38.0* 34.6* 34.7*  MCV 88.7 88.3 90.9 93.8 98.6  PLT 197 174 180 154 134*     Basic Metabolic Panel: Recent Labs  Lab 02/13/22 0411 02/13/22 1625 02/14/22 0445 02/15/22 0352 02/16/22 0301 02/17/22 0305  NA 135 137 138 145 145  --   K 4.4 4.5 4.3 4.0 3.5  --   CL 94* 97* 97* 104 112*  --   CO2 31 33* 33* 36* 31  --   GLUCOSE 273* 296* 316* 174* 95  --   BUN 36* 43* 47* 46* 38*  --   CREATININE 1.68* 1.51* 1.34* 1.16 0.90  --   CALCIUM 8.2* 8.2* 8.2* 8.3* 7.7*  --   MG 2.1  --  2.4 2.4 2.1 2.1  PHOS 6.3*  --  4.9* 3.9 2.4* 4.6    GFR: Estimated Creatinine Clearance: 179 mL/min (by C-G formula based on SCr of 0.9 mg/dL). Recent Labs  Lab 02/11/22 2218  02/12/22 0051 02/12/22 0254 02/12/22 0602 02/13/22 0411 02/13/22 1146 02/13/22 1625 02/14/22 0445 02/15/22 0352 02/16/22 0301 02/17/22 0305  PROCALCITON <0.10  --   --   --  <0.10  --   --   --   --   --   --   WBC  --   --    < >  --  16.3*  --   --  14.1* 14.6* 12.1* 14.6*  LATICACIDVEN 4.0* 4.1*  --  3.4*  --  2.4* 2.6*  --   --   --   --    < > = values in this interval not displayed.     Liver Function Tests: Recent Labs  Lab 02/11/22 2218  AST 23  ALT 20  ALKPHOS 69  BILITOT 0.8  PROT 7.2  ALBUMIN 3.5    ABG    Component Value Date/Time   PHART 7.32 (L) 02/17/2022 0701   PCO2ART 72 (HH) 02/17/2022 0701   PO2ART 55 (L) 02/17/2022 0701   HCO3 37.1 (H) 02/17/2022 0701   TCO2 26 03/17/2007 1822   O2SAT 85.8 02/17/2022 0701     Home Medications  Prior to Admission medications   Medication Sig Start Date End Date Taking? Authorizing Provider  losartan (COZAAR) 100 MG tablet Take 100 mg by mouth  daily. 02/01/22  Yes [provider]  OZEMPIC, 0.25 OR 0.5 MG/DOSE, 2 MG/3ML SOPN SMARTSIG:0.25 Milligram(s) SUB-Q Once a Week 12/09/21  Yes [provider]  torsemide (DEMADEX) 20 MG tablet Take 20 mg by mouth daily. 12/18/21  Yes [provider]  Aspirin-Salicylamide-Caffeine (BC HEADACHE POWDER PO) Take 1 packet by mouth as needed (for pain).    [provider]  cephALEXin (KEFLEX) 500 MG capsule Take 1 capsule (500 mg total) by mouth 4 (four) times daily. Patient not taking: Reported on 02/11/2022 12/16/18   Davonna Belling, MD  cyclobenzaprine (FLEXERIL) 10 MG tablet Take 0.5-1 tablets (5-10 mg total) by mouth 2 (two) times daily as needed for muscle spasms. Patient not taking: Reported on 02/11/2022 03/12/18   Margarita Mail, PA-C  gabapentin (NEURONTIN) 600 MG tablet Take 600 mg by mouth at bedtime. Patient not taking: Reported on 02/11/2022 10/13/21   [provider]  LABETALOL HCL PO Take by mouth. Patient not taking:  Reported on 02/11/2022    [provider]  meloxicam (MOBIC) 15 MG tablet Take 1 tablet (15 mg total) by mouth daily. Take 1 daily with food. Patient not taking: Reported on 02/11/2022 03/12/18   Margarita Mail, PA-C  metFORMIN (GLUCOPHAGE) 500 MG tablet Take 500 mg by mouth 2 (two) times daily with a meal. Patient not taking: Reported on 02/11/2022    [provider]  METFORMIN HCL ER, MOD, PO Take by mouth. Patient not taking: Reported on 02/11/2022    [provider]  METOPROLOL SUCCINATE ER PO Take by mouth. Patient not taking: Reported on 02/11/2022    [provider]  oxyCODONE-acetaminophen (PERCOCET) 10-325 MG tablet Take 1 tablet by mouth See admin instructions. Take 1 tablet by mouth 4-5 times a day as needed for pain Patient not taking: Reported on 02/11/2022    [provider]  oxyCODONE-acetaminophen (PERCOCET/ROXICET) 5-325 MG tablet Take 1 tablet by mouth every 8 (eight) hours as needed for severe pain. Patient not taking: Reported on 02/11/2022 12/16/18   Davonna Belling, MD  predniSONE (DELTASONE) 20 MG tablet Take 2 tablets (40 mg total) by mouth daily. Patient not taking: Reported on 02/11/2022 04/04/15   Davonna Belling, MD   Hospital Scheduled Meds:  artificial tears  1 Application Both Eyes F1T   budesonide (PULMICORT) nebulizer solution  0.5 mg Nebulization BID   Chlorhexidine Gluconate Cloth  6 each Topical Q0600   docusate  100 mg Per Tube BID   feeding supplement (PROSource TF)  90 mL Per Tube 5 X Daily   free water  30 mL Per Tube Q4H   furosemide  40 mg Intravenous BID   insulin aspart  0-20 Units Subcutaneous Q4H   insulin aspart  8 Units Subcutaneous Q4H   insulin glargine-yfgn  26 Units Subcutaneous Daily   insulin starter kit- pen needles  1 kit Other Once   ipratropium-albuterol  3 mL Nebulization Q6H   living well with diabetes book   Does not apply Once   multivitamin  1 tablet Per Tube QHS   mouth rinse  15 mL  Mouth Rinse Q2H   oxyCODONE  10 mg Per Tube Q6H   pantoprazole sodium  40 mg Per Tube QHS   polyethylene glycol  17 g Per Tube Daily   sodium chloride flush  10-40 mL Intracatheter Q12H   sodium chloride flush  3 mL Intravenous Q12H   Continuous Infusions:  sodium chloride     sodium chloride Stopped (02/11/22 2011)  ceFEPime (MAXIPIME) IV 2 g (02/17/22 0535)   feeding supplement (VITAL 1.5 CAL) 45 mL/hr at 02/17/22 0445   fentaNYL infusion INTRAVENOUS 300 mcg/hr (02/17/22 0445)   heparin 2,000 Units/hr (02/17/22 0445)   ketamine (KETALAR) 2,500 mg in sodium chloride 0.9 % 500 mL (5 mg/mL) infusion 3 mg/kg/hr (02/17/22 0445)   midazolam 10 mg/hr (02/17/22 0644)   milrinone Stopped (02/16/22 1548)   milrinone 0.25 mcg/kg/min (02/17/22 0445)   vancomycin 2,000 mg (02/17/22 0609)   vecuronium (NORCURON) infusion 145m/100mL (1 mg/mL) 1 mcg/kg/min (02/17/22 0445)   PRN Meds:.sodium chloride, acetaminophen **OR** acetaminophen, cyclobenzaprine, docusate sodium, fentaNYL, midazolam, ondansetron **OR** ondansetron (ZOFRAN) IV, mouth rinse, polyethylene glycol, sodium chloride flush, sodium chloride flush, traZODone      Active Hospital Problem list    Patient Active Problem List   Diagnosis Date Noted   Acute respiratory failure ([facility] 02/11/2022   Respiratory failure ([facility] 02/11/2022   Acute respiratory failure with hypoxia and hypercarbia (HCC) 02/11/2022   Acute on chronic systolic CHF (congestive heart failure) ([facility] 02/11/2022   Hypertensive urgency 02/11/2022   Type 2 diabetes mellitus with peripheral neuropathy ([facility] 02/11/2022   OBSTRUCTIVE SLEEP APNEA 04/30/2009    Assessment & Plan:   46yo morbidly obese AAM with severe decompensated acute systolic and diastolic heart failure with previously documented severe OSA/OHS, noncompliant with CPAP, active smoker, leading to severe hypoxic resp failure with encephalopathy and difficulty with sedation  Severe acute on chronic  hypoxic and hypercapnic respiratory failure due to CAP and acute decompensation of chronic heart failure Persistent hypoxemia despite trial of several ventilator strategies continue Mechanical Ventilator support Wean Fio2 and PEEP as tolerated Continue VAP/VENT bundles  Wean PEEP & FiO2 as tolerated, maintain SpO2 > 88% Head of bed elevated 30 degrees, VAP protocol in place Plateau pressures less than 30 cm H20 as able Intermittent chest x-ray & ABG PRN Ensure adequate pulmonary hygiene  will NOT perform SAT/SBT due to high FiO2 requirements, asynchrony off sedation  Requiring paralytic infusion for ventilator synchrony Recruitment maneuvers every 4h and as needed. On heparin infusion as cannot exclude PE, however no DVT on LE Dopplers assuring, Patient cannot be proned, due to weight limit for safe proning and unstable airway (ET tube with constant need of repositioning)  Community-acquired pneumonia: Streptococcus pneumoniae and Haemophilus influenzae Fever spike today Sputum re-cultured Changed antibiotics to ceftaz today -  Acute on chronic systolic and diastolic heart failure LVEF 40 to 45% [facility] [place] Las[place] twice daily IV Trial of milrinone Creatinine 0.9 today Diamox 500 mg x 1 Hold metoprolol and Losartan due to sedation related hypotension 2D Echocardiogram shows diastolic dysfunction and enlarged left and right atria consistent with restrictive physiology  ACUTE KIDNEY INJURY/Renal Failure -continues to improve Element of cardiorenal syndrome -continue Foley catheter-assess need -Avoid nephrotoxic agents -Follow urine output, BMP -Ensure adequate renal perfusion, optimize oxygenation -Renal dose medications -Nephrology has signed off -On Lasix twice daily -Added milrinone   Intake/Output Summary (Last 24 hours) at 02/17/2022 0727 Last data filed at 02/17/2022 0551 Gross per 24 hour   Intake 5141.25 ml  Output 6925 ml  Net -1783.75 ml        Latest Ref Rng & Units 02/16/2022    3:01 AM 02/15/2022    3:52 AM 02/14/2022    4:45 AM  BMP  Glucose 70 - 99 mg/dL 95  174  316   BUN 6 - 20 mg/dL 38  46  47   Creatinine 0.61 - 1.24 mg/dL 0.90  1.16  1.34   Sodium 135 - 145 mmol/L 145  145  138   Potassium 3.5 - 5.1 mmol/L 3.5  4.0  4.3   Chloride 98 - 111 mmol/L 112  104  97   CO2 22 - 32 mmol/L 31  36  33   Calcium 8.9 - 10.3 mg/dL 7.7  8.3  8.2     ENDO - ICU hypoglycemic\Hyperglycemia protocol -check FSBS per protocol -Diabetes mellitus -HgbA1c  10.1 -CBG's AC & hs; Target range of 140 to 180 -SSI -Follow ICU Hypo/Hyperglycemia protocol -Hold Metformin  GI GI PROPHYLAXIS PPI  NUTRITIONAL STATUS DIET-->TF's as tolerated Constipation protocol as indicated  ACUTE ANEMIA- Transfuse as needed Consider transfusion  if hgb<7  Best practice:  Diet: Tube feeds Pain/Anxiety/Delirium protocol (if indicated): Yes (RASS goal 0) VAP protocol (if indicated): Yes DVT prophylaxis: Systemic AC GI prophylaxis: PPI Glucose control:  SSI Yes Central venous access:  N/A Arterial line:  N/A Foley:  Yes, and it is still needed Mobility:  bed rest  PT consulted: N/A Last date of multidisciplinary goals of care discussion [7/25] Code Status:  full code Disposition: ICU   Critical care provider statement:   Total critical care time: 33 minutes   Performed by: Lanney Gins MD   Critical care time was exclusive of separately billable procedures and treating other patients.   Critical care was necessary to treat or prevent imminent or life-threatening deterioration.   Critical care was time spent personally by me on the following activities: development of treatment plan with patient and/or surrogate as well as nursing, discussions with consultants, evaluation of patient's response to treatment, examination of patient, obtaining history from patient or surrogate,  ordering and performing treatments and interventions, ordering and review of laboratory studies, ordering and review of radiographic studies, pulse oximetry and re-evaluation of patient's condition.    Ottie Glazier, M.D.  Pulmonary & Critical Care Medicine

## 2022-02-17 NOTE — Progress Notes (Signed)
Recruitment Maneuver (PSV 30/30 100% for 30 sec) completed x 2

## 2022-02-17 NOTE — Consult Note (Signed)
PHARMACY CONSULT NOTE  Pharmacy Consult for Electrolyte Monitoring and Replacement   Recent Labs: Potassium (mmol/L)  Date Value  02/17/2022 3.9   Magnesium (mg/dL)  Date Value  02/17/2022 2.1   Calcium (mg/dL)  Date Value  02/17/2022 6.8 (L)   Albumin (g/dL)  Date Value  02/11/2022 3.5   Phosphorus (mg/dL)  Date Value  02/17/2022 4.6   Sodium (mmol/L)  Date Value  02/17/2022 145   Assessment: Patient is a 46 y/o M with medical history including DM, HTN, pancreatitis, tobacco use disorder, systolic CHF, OSA, DM c/b diabetic neuropathy, lumbar radiculopathy, morbid obesity who is admitted with acute respiratory failure in setting of acute CHF and hypertensive urgency. Patient is currently intubated, sedated, and on mechanical ventilation in the ICU. Pharmacy consulted to assist with electrolyte monitoring and replacement as indicated.  Nutrition: Tube feeds started 7/26 Diuretics: IV Lasix 40 mg BID + metolazone 5 mg x 1 7/31  Goal of Therapy:  Electrolytes within normal limits  Plan:  --No electrolyte replacement indicated at this time --Follow-up electrolytes with AM labs tomorrow  Benita Gutter 02/17/2022 11:10 AM

## 2022-02-17 NOTE — Consult Note (Signed)
  ANTICOAGULATION CONSULT NOTE   Pharmacy Consult for Heparin infusion Indication:  concern for possible PE  Allergies  Allergen Reactions   Hydrocodone Swelling    Patient Measurements: Height: 5' 9.02" (175.3 cm) Weight: (!) 196 kg (432 lb 1.6 oz) IBW/kg (Calculated) : 70.74 Heparin Dosing Weight: 121 kg  Vital Signs: Temp: 100.4 F (38 C) (07/31 0012) Temp Source: Esophageal (07/31 0012) BP: 127/75 (07/30 2300) Pulse Rate: 115 (07/30 2100)  Labs: Recent Labs    02/14/22 0445 02/15/22 0352 02/15/22 2048 02/16/22 0301 02/17/22 0305  HGB 10.9* 11.1*  --  9.7* 9.5*  HCT 36.3* 38.0*  --  34.6* 34.7*  PLT 174 180  --  154 134*  HEPARINUNFRC  --   --  0.62 0.56 0.40  CREATININE 1.34* 1.16  --  0.90  --      Estimated Creatinine Clearance: 179 mL/min (by C-G formula based on SCr of 0.9 mg/dL).   Medical History: Past Medical History:  Diagnosis Date   Diabetes mellitus without complication ([place])    Hypertension    Pancreatitis     Medications:  No history of chronic anticoagulant use PTA  Assessment: Pharmacy has been consulted to initiate and monitor heparin in 46yo patient with severe acute hypoxic and hypercapnic respiratory failure despite trial of several ventilator strategies. Patient was receiving lovenox 0.5mg /kg Q24 hours during hospitalization. Will now transition to heparin infusion  7/29 2048 HL 0.62, therapeutic x 1 7/30 0301 HL 0.90, therapeutic x2 7/30 0305 HL 0.40, therapeutic x3  Goal of Therapy:  Heparin level 0.3-0.7 units/ml Monitor platelets by anticoagulation protocol: Yes   Plan:  Continue Heparin infusion at current rate of 2000 units/hr Recheck HL tomorrow with AM labs Daily CBC while on Heparin infusion  Darrick Penna Clinical Pharmacist 02/17/2022 4:29 AM

## 2022-02-17 NOTE — Progress Notes (Signed)
   02/17/22 1540  Clinical Encounter Type  Visited With Family  Visit Type Follow-up;Spiritual support;Social support  Spiritual Encounters  Spiritual Needs Prayer;Emotional   Chaplain Nesta Kimple f/u with pt' S daughter, Destinie. Chaplain B offered compassionate presence and ongoing support, also active listening as Destinie shared feelings about impact of what she experiences as negativity, even when the intention is to offer possible real outcomes. She is trying to hold open the possibility of hope and recovery.  This chaplain sought to join her in this, highlighting that although outcomes are uncertain that still leaves the possibility for change. This chaplain offered words of encouragement including drawing upon faith to offer was to sustain hope. Chaplain B offered prayer and ongoing support.  Will continue to follow family and pt condition.

## 2022-02-17 NOTE — Progress Notes (Signed)
Recruitment Maneuver done    PCV 40 ,RR 10,PEEP +5, 100%, I:E 1:1  X 2 MIN

## 2022-02-18 ENCOUNTER — Inpatient Hospital Stay: Payer: Medicaid Other

## 2022-02-18 DIAGNOSIS — R609 Edema, unspecified: Secondary | ICD-10-CM | POA: Diagnosis not present

## 2022-02-18 DIAGNOSIS — Z515 Encounter for palliative care: Secondary | ICD-10-CM

## 2022-02-18 DIAGNOSIS — J9601 Acute respiratory failure with hypoxia: Secondary | ICD-10-CM | POA: Diagnosis not present

## 2022-02-18 DIAGNOSIS — N179 Acute kidney failure, unspecified: Secondary | ICD-10-CM

## 2022-02-18 DIAGNOSIS — Z7189 Other specified counseling: Secondary | ICD-10-CM | POA: Diagnosis not present

## 2022-02-18 LAB — BASIC METABOLIC PANEL
Anion gap: 8 (ref 5–15)
Anion gap: 6 (ref 5–15)
BUN: 72 mg/dL — ABNORMAL HIGH (ref 6–20)
BUN: 72 mg/dL — ABNORMAL HIGH (ref 6–20)
CO2: 32 mmol/L (ref 22–32)
CO2: 31 mmol/L (ref 22–32)
Calcium: 8.3 mg/dL — ABNORMAL LOW (ref 8.9–10.3)
Calcium: 8.4 mg/dL — ABNORMAL LOW (ref 8.9–10.3)
Chloride: 111 mmol/L (ref 98–111)
Chloride: 114 mmol/L — ABNORMAL HIGH (ref 98–111)
Creatinine, Ser: 1.88 mg/dL — ABNORMAL HIGH (ref 0.61–1.24)
Creatinine, Ser: 1.64 mg/dL — ABNORMAL HIGH (ref 0.61–1.24)
GFR, Estimated: 45 mL/min — ABNORMAL LOW (ref >60)
GFR, Estimated: 53 mL/min — ABNORMAL LOW (ref >60)
Glucose, Bld: 177 mg/dL — ABNORMAL HIGH (ref 70–99)
Glucose, Bld: 222 mg/dL — ABNORMAL HIGH (ref 70–99)
Potassium: 5 mmol/L (ref 3.5–5.1)
Potassium: 5.3 mmol/L — ABNORMAL HIGH (ref 3.5–5.1)
Sodium: 151 mmol/L — ABNORMAL HIGH (ref 135–145)
Sodium: 151 mmol/L — ABNORMAL HIGH (ref 135–145)

## 2022-02-18 LAB — CBC WITH DIFFERENTIAL/PLATELET
Abs Immature Granulocytes: 0.13 10*3/uL — ABNORMAL HIGH (ref 0.00–0.07)
Basophils Absolute: 0.1 10*3/uL (ref 0.0–0.1)
Basophils Relative: 0 %
Eosinophils Absolute: 0.3 10*3/uL (ref 0.0–0.5)
Eosinophils Relative: 2 %
HCT: 39.8 % (ref 39.0–52.0)
Hemoglobin: 10.8 g/dL — ABNORMAL LOW (ref 13.0–17.0)
Immature Granulocytes: 1 %
Lymphocytes Relative: 8 %
Lymphs Abs: 1.3 10*3/uL (ref 0.7–4.0)
MCH: 26.3 pg (ref 26.0–34.0)
MCHC: 27.1 g/dL — ABNORMAL LOW (ref 30.0–36.0)
MCV: 96.8 fL (ref 80.0–100.0)
Monocytes Absolute: 1.6 10*3/uL — ABNORMAL HIGH (ref 0.1–1.0)
Monocytes Relative: 11 %
Neutro Abs: 11.9 10*3/uL — ABNORMAL HIGH (ref 1.7–7.7)
Neutrophils Relative %: 78 %
Platelets: 172 10*3/uL (ref 150–400)
RBC: 4.11 MIL/uL — ABNORMAL LOW (ref 4.22–5.81)
RDW: 15.7 % — ABNORMAL HIGH (ref 11.5–15.5)
WBC: 15.3 10*3/uL — ABNORMAL HIGH (ref 4.0–10.5)
nRBC: 0 % (ref 0.0–0.2)

## 2022-02-18 LAB — GLUCOSE, CAPILLARY
Glucose-Capillary: 153 mg/dL — ABNORMAL HIGH (ref 70–99)
Glucose-Capillary: 193 mg/dL — ABNORMAL HIGH (ref 70–99)
Glucose-Capillary: 169 mg/dL — ABNORMAL HIGH (ref 70–99)
Glucose-Capillary: 186 mg/dL — ABNORMAL HIGH (ref 70–99)
Glucose-Capillary: 201 mg/dL — ABNORMAL HIGH (ref 70–99)

## 2022-02-18 LAB — C-REACTIVE PROTEIN: CRP: 47.5 mg/dL — ABNORMAL HIGH (ref <1.0)

## 2022-02-18 LAB — PROTEIN / CREATININE RATIO, URINE
Creatinine, Urine: 132 mg/dL
Protein Creatinine Ratio: 0.91 mg/mg{Cre} — ABNORMAL HIGH (ref 0.00–0.15)
Total Protein, Urine: 120 mg/dL

## 2022-02-18 LAB — MAGNESIUM: Magnesium: 2.7 mg/dL — ABNORMAL HIGH (ref 1.7–2.4)

## 2022-02-18 LAB — PHOSPHORUS: Phosphorus: 4.1 mg/dL (ref 2.5–4.6)

## 2022-02-18 MED ORDER — SODIUM ZIRCONIUM CYCLOSILICATE 5 G PO PACK
10.0000 g | PACK | Freq: Once | ORAL | Status: AC
  Administered 2022-02-18: 10 g
  Filled 2022-02-18: qty 2

## 2022-02-18 MED ORDER — VECURONIUM BROMIDE 10 MG IV SOLR
INTRAVENOUS | Status: AC
  Filled 2022-02-18: qty 20

## 2022-02-18 MED ORDER — FREE WATER
100.0000 mL | Status: DC
  Administered 2022-02-18 – 2022-02-21 (×19): 100 mL

## 2022-02-18 MED ORDER — VECURONIUM BROMIDE 10 MG IV SOLR
20.0000 mg | Freq: Once | INTRAVENOUS | Status: AC
  Administered 2022-02-18: 20 mg via INTRAVENOUS

## 2022-02-18 MED ORDER — BISACODYL 10 MG RE SUPP
10.0000 mg | Freq: Once | RECTAL | Status: AC
  Administered 2022-02-18: 10 mg via RECTAL
  Filled 2022-02-18: qty 1

## 2022-02-18 MED ORDER — VECURONIUM BROMIDE 10 MG IV SOLR
20.0000 mg | INTRAVENOUS | Status: DC | PRN
  Administered 2022-02-19 (×2): 20 mg via INTRAVENOUS
  Filled 2022-02-18 (×2): qty 20

## 2022-02-18 MED ORDER — METHYLPREDNISOLONE SODIUM SUCC 125 MG IJ SOLR
60.0000 mg | Freq: Two times a day (BID) | INTRAMUSCULAR | Status: DC
  Administered 2022-02-18 – 2022-02-19 (×2): 60 mg via INTRAVENOUS
  Filled 2022-02-18 (×2): qty 2

## 2022-02-18 NOTE — Progress Notes (Signed)
Recruitment Maneuver performed Peep 40 Rate 5 100% x 40 seconds with a drop of saturation to 78%

## 2022-02-18 NOTE — Progress Notes (Addendum)
Early in shift, received okay from ICU NP to be able to change patient's position and patient tolerated a moderate turn to the right with no significant decrease in oxygen saturations. Attempted a more extreme turn to the right but patient's oxygen saturations dropped to the upper 60/70s and would not recover until position changed to left lung down. Stayed left lung down until xray needed reposition for xray. After xray patient's oxygen saturations dropped to the 60s, repositioned left lung down but still fluctuated. Dana NP at bedside with [facility] RT, patient ended being bagged for several minutes. Oxygen saturations dropped as low as the 40s. At end of bagging and suctioning, patient was upper 70s and mid 80s oxygen saturations.  Paralytic infusion had been discontinued earlier in shift but patient received paralytic IV push to assist with bagging and suctioning during that event.

## 2022-02-18 NOTE — Progress Notes (Signed)
Recruitment Maneuver completed  PC 0/40 R4 for 30 sec x2 with 2 min break between maneuvers

## 2022-02-18 NOTE — Consult Note (Signed)
PHARMACY CONSULT NOTE  Pharmacy Consult for Electrolyte Monitoring and Replacement   Recent Labs: Potassium (mmol/L)  Date Value  02/18/2022 5.0   Magnesium (mg/dL)  Date Value  02/18/2022 2.7 (H)   Calcium (mg/dL)  Date Value  02/18/2022 8.3 (L)   Albumin (g/dL)  Date Value  02/11/2022 3.5   Phosphorus (mg/dL)  Date Value  02/18/2022 4.1   Sodium (mmol/L)  Date Value  02/18/2022 151 (H)   Assessment: Patient is a 46 y/o M with medical history including DM, HTN, pancreatitis, tobacco use disorder, systolic CHF, OSA, DM c/b diabetic neuropathy, lumbar radiculopathy, morbid obesity who is admitted with acute respiratory failure in setting of acute CHF and hypertensive urgency. Patient is currently intubated, sedated, and on mechanical ventilation in the ICU. Pharmacy consulted to assist with electrolyte monitoring and replacement as indicated.  Nutrition: Tube feeds started 7/26 Diuretics: IV Lasix 40 mg BID + metolazone 5 mg x 1 7/31  Goal of Therapy:  Electrolytes within normal limits  Plan:  --Hypernatremia & potassium at ULN with associated AKI secondary to diuresis. Defer management to PCCM --Follow-up electrolytes with AM labs tomorrow  Benita Gutter 02/18/2022 7:37 AM

## 2022-02-18 NOTE — Consult Note (Signed)
Palliative Care Consult Note                                  Date: 02/18/2022   Patient Name: Samuel Chavez  DOB: 30-Nov-1975  MRN: [id number]  Age / Sex: 46 y.o., male  PCP: [facility] Referring Physician: Vida Rigger, MD  Reason for Consultation: Establishing goals of care  HPI/Patient Profile: 46 y.o. male  with past medical history of type 2 diabetes mellitus, hypertension, pancreatitis, ongoing tobacco abuse, systolic CHF, OSA, type 2 diabetes mellitus and diabetic neuropathy, lumbar radiculopathy, and morbid obesity, who presented to the ER with acute onset of worsening lower extremity edema with associated dyspnea as well as orthopnea. He was found to have acute on chronic hypoxic and hypercapnic respiratory failure requiring BiPAP, which he failed.  He was subsequently intubated and transferred to the ICU he was admitted on 02/11/2022 with severe acute on chronic hypoxic and hypercapnic respiratory failure due to CAP and acute decompensated heart failure, persistent hypoxemia despite ventilator, community-acquired pneumonia, acute on chronic systolic and diastolic heart failure, acute kidney injury, and others.   During the patient's ICU stay he has continued to decompensate and decline despite maximal therapies.  PMT was consulted for goals of care conversation and support of patient and family.  Past Medical History:  Diagnosis Date   Diabetes mellitus without complication (HCC)    Hypertension    Pancreatitis     Subjective:   This NP Wynne Dust reviewed medical records, received report from team, assessed the patient and then meet at the patient's bedside to discuss diagnosis, prognosis, GOC, EOL wishes disposition and options.  I met with the patient's daughter Samuel Chavez at the bedside.  The patient is intubated and unable to meaningfully interact.   Concept of Palliative Care was introduced as specialized  medical care for people and their families living with serious illness.  If focuses on providing relief from the symptoms and stress of a serious illness.  The goal is to improve quality of life for both the patient and the family. Values and goals of care important to patient and family were attempted to be elicited.  Created space and opportunity for patient  and family to explore thoughts and feelings regarding current medical situation   Natural trajectory and current clinical status were discussed. Questions and concerns addressed. Patient  encouraged to call with questions or concerns.    Patient/Family Understanding of Illness: She has a firm understanding of the patient's situation as the ICU team has kept her very well informed of the details.  She understands he is really sick and 1 lung is worsening others.  She also understands his kidneys are getting bad and all of this is related to pneumonia.  She states "I have been sitting here and seeing him getting worse and everyone insists on coming in and telling me how bad it is."  Life Review: The patient has 13 children and an ex-wife.  His daughter Samuel Chavez lives with him along with her 2 brothers who are ages 73 and 42.  He is on so security disability due to his back and sleep apnea.  He enjoys working on cars when he is able to (back pain) and is described as a night owl.  He likes to get out of his swelling in his legs and feeling okay.  He does not have a big appetite and  when he eats he generally eats unhealthy foods.  He is described as social and is always around his kids and grandkids.  Patient Values: Family  Goals: Continued aggressive care, full scope of care  Today's Discussion: In addition to life review and discussions described above we had various discussions on multiple topics.  We talked about the patient's daughter taking care of herself, getting enough sleep, eating, etc.  She states she is doing the best she can but  she does not want to leave her father's bedside.  She describes her brothers aged 5 and 103, will also live with the patient, is likely having the hardest time as they generally received all of their support from their father.  They do not talk much to their mother.  The patient's daughter states that when the children are around she notices that her dad does get somewhat depressed.  She describes that she is somewhat of a functional caregiver for her father ensuring he has his medications, takes his medications, goes to his doctor's appointments, goes to court when needed, etc.  She states that he always puts up with his first evening when they are not there for him.  We discussed the chaplain/spiritual care visits.  She states that this is helped.  She seems to rely a lot on her faith.  She does feel that the medical team is always coming and explaining the bad news, what they are trying to do, what they are planning today.  She states that nobody ever tells her "I hope he gets better".  She feels that there is a lot of negativity and she is trying to remain positive.  She describes them as a Management consultant" and because of his age she is hopeful for a recovery.  She stated that she has talked with family and they are all in agreement for trach if that becomes necessary.  I shared my concern that he may not be stable enough for trach when the time comes.  She verbalized understanding.  I provided emotional and general support through therapeutic listening, empathy, sharing stories, techniques.  Answered all questions and addressed all concerns best my ability.  Before I left, I told the patient I will continue to keep her in her father and that also encouraged.  I told her that I hope he gets better but regardless of what happens she will be here forever.  She seemed to appreciate the sentiment.  Review of Systems  Unable to perform ROS: Intubated    Objective:   Primary Diagnoses: Present on  Admission:  Respiratory failure [facility])   Physical Exam Vitals and nursing note reviewed.  Constitutional:      General: He is not in acute distress.    Appearance: He is ill-appearing and toxic-appearing.     Interventions: He is sedated, chemically paralyzed and intubated.  HENT:     Head: Normocephalic and atraumatic.  Cardiovascular:     Rate and Rhythm: Tachycardia present.  Pulmonary:     Effort: He is intubated.     Breath sounds: Decreased air movement present. Rhonchi present.  Abdominal:     General: Abdomen is protuberant.     Palpations: Abdomen is soft.  Neurological:     Mental Status: He is unresponsive.     Vital Signs:  BP (!) 147/75   Pulse (!) 108   Temp (!) 100.4 F (38 C) (Esophageal)   Resp (!) 30   Ht 5' 9.02" (1.753 m)   Wt [facility])  195 kg   SpO2 (!) 85%   BMI 63.46 kg/m   Palliative Assessment/Data: 10% (on tube feedings)    Advanced Care Planning:   Primary Decision Maker: NEXT OF KIN  Code Status/Advance Care Planning: Full code  A discussion was had today regarding advanced directives. Concepts specific to code status, artifical feeding and hydration, continued IV antibiotics and rehospitalization was had.  The difference between a aggressive medical intervention path and a palliative comfort care path for this patient at this time was had.   Decisions/Changes to ACP: None today  Assessment & Plan:   Impression: 46 year old male who is critically ill with respiratory failure, pneumonia, CHF, AKI all worsening despite maximal medical therapy.  He is currently on 100% FiO2 with a respiratory rate of 30 and PEEP of 18.  He is turned on his side and is having recruitment maneuvers.  However, he is too unstable to prone and too unstable to go to CT for follow-up imaging.  Despite maximal medical therapy he occasionally drops his saturations into the 60s or 70s, typically averages low 80s to 90%.  His daughter seems to understand how sick he is,  but continues to hold out hope for recovery.  She is steadfast in her request for full scope of treatment and aggressive treatment.  SUMMARY OF RECOMMENDATIONS   Continue full scope of treatment Remain full code Continued emotional support of patient's family PMT will continue to follow  Symptom Management:  Per primary team PMT is available to assist as needed  Prognosis:  Unable to determine  Discharge Planning:  To Be Determined   Discussed with: Medical team, nursing team, patient's family    Thank you for allowing [place] to participate in the care of Samuel Chavez PMT will continue to support holistically.  Time Total: 90 min  Greater than 50%  of this time was spent counseling and coordinating care related to the above assessment and plan.  Signed by: Walden Field, NP Palliative Medicine Team  Team Phone # [phone] (Nights/Weekends)  02/18/2022, 12:19 PM

## 2022-02-18 NOTE — Progress Notes (Signed)
NAME:  Samuel Chavez, MRN:  [id number], DOB:  02-28-1976, LOS: 7 ADMISSION DATE:  02/11/2022, CONSULTATION DATE: 02/11/22 REFERRING MD:  Valente David MD  CHIEF COMPLAINT: SOB   CC  follow up RESP FAILURE/acute CHF exacerbation  HPI/SYNOPSIS  46 y.o male with significant PMH as below who presented to the ED with chief complaints of SOB and worsening bilateral lower extremities edema.  ED Course: In the emergency department, the temperature was 37.3C, the heart rate 99 beats/minute, the blood pressure 187/102 mm Hg, the respiratory rate 20 breaths/minute, and the oxygen saturation 89% on. He was noted to be drowsy but still protecting his airway and responding appropriately.  Pertinent INITIAL Labs/Diagnostics Findings: Chemistry:Glucose:319 Calcium: 8.8 otherwise unremarkable CBC: unremarkable Other Lab findings: ABG with hypoxemia and hypercapnia, .  Imaging: Chest x-ray showed bilateral infiltrates worse on right  -02/17/22- patient is severely critically ill with bilateral multifocal pneumonia on sedation and paralysis with mechanical ventilation maximal settings.  He is at cusp of death, we met with daughter today and discussed severity of illness. Unable to perform CT due to severity of critical illness. Met with previous PCCM doc and discussed medical plan.  Patient had recruitment maneuvers last few days without improvement, on heparin gtt for possible PE.   02/18/22- patient continues to require maximal settings on ventilator despite good UOP.  He destaturated to <50% spO2 on MV today required BGV.     Significant Hospital Events   7/25: Admitted to hospitalist service w/acute on chronic hypoxic hypercapnic resp. failure requiring BiPAP.Failed BiPAP and intubated. PCCM consulted 7/26: INTUBATED, difficlut to sedate, started KETAMINE INFUSION 7/27: severe hypoxia, PEEP increased to 15 but decreased back to 10 7/28: Persistent severe hypoxia, ventilator changes made 7/29: Persistent  hypoxia, recruitment maneuvers instituted, ventilator changes made, empiric heparin as patient cannot be scanned query PE  Consults:  PCCM  Procedures:  7/25: Intubation 7/26: PICC triple-lumen, right basilic vein  Significant Diagnostic Tests:  7/25  Chest Xray:Chest x-ray showed cardiomegaly and pulmonary vascular congestion without overt pulmonary edema 7/25 Echocardiogram: difficult study, LVEF was estimated at 55 to 60%, dilated LV moderate LVH, grade 1 DD, enlargement of the right ventricle, left atrial size moderately dilated, right atrial size moderately dilated this is consistent with restrictive physiology (echo reading not congruous with prior echoes from [facility]) 7/30 Limited echo: LVEF 40 to 45% decreased LV function, no wall motion abnormalities, moderate dilation of the LV concentric LVH, right ventricular size moderately enlarged, right atrial enlargement, this is more consistent with prior [facility] scans  Micro Data:  7/25: SARS-CoV-2 PCR> negative 7/25: MRSA PCR>> NEG 7/27: Strep pneumoniae Ag>> negative 7/27: Legionella Ag>> 7/27: Mycoplasma>> 7/27: Sputum>> Strep pneumo,Haemophilus influenzae 7/30 Sputum cult >>  Antimicrobials:  7/27: Maxipime >> 7/27: Vancomycin >> 7/29 7/30: Vancomycin (resumed due to fever spike on Maxipime) ? Resistant Strep pneumo   REVIEW OF SYSTEMS Patient is unable to provide complete review of systems due to severe critical illness/ventilator dependence  OBJECTIVE  Blood pressure 138/67, pulse (!) 109, temperature (!) 100.4 F (38 C), temperature source Esophageal, resp. rate (!) 30, height 5' 9.02" (1.753 m), weight (!) 195 kg, SpO2 (!) 89 %. CVP:  [9 mmHg-43 mmHg] 11 mmHg   Vent Mode: PRVC FiO2 (%):  [100 %] 100 % Set Rate:  [30 bmp] 30 bmp Vt Set:  [500 mL] 500 mL PEEP:  [15 cmH20-18 cmH20] 18 cmH20 Plateau Pressure:  [31 cmH20-36 cmH20] 35 cmH20   Intake/Output  Summary (Last 24 hours) at 02/18/2022  0913 Last data filed at 02/18/2022 0800 Gross per 24 hour  Intake 4260.04 ml  Output 3110 ml  Net 1150.04 ml    Filed Weights   02/16/22 0500 02/17/22 0410 02/18/22 0454  Weight: (!) 199 kg (!) 196 kg (!) 195 kg   PHYSICAL EXAMINATION: GENERAL: Morbidly obese man, intubated, mechanically ventilated, sedated.  Malodorous mouth secretions. HEAD: Normocephalic, atraumatic.  EYES: Pupils equal, round, reactive to light.  No scleral icterus.  MOUTH: Macroglossia (massive).  Orotracheally intubated, OG in place. NECK: Supple. No thyromegaly. Trachea midline. No JVD.  No adenopathy. PULMONARY: Good air entry bilaterally.  Occasional rhonchi noted. CARDIOVASCULAR: S1 and S2.  Sinus tach.  ABDOMEN: Obese, soft, nondistended, tolerating tube feeds. MUSCULOSKELETAL: No joint deformity, no clubbing, no edema.  SCDs in place. NEUROLOGIC: Sedated, required paralytic last night, is on infusion, cannot make any further assessment.   SKIN: Intact,warm,dry.  Chronic stasis changes in the lower extremities. PSYCH: Not assessed due to mechanically ventilated status.  Labs/imaging that I havepersonally reviewed    Labs   CBC: Recent Labs  Lab 02/14/22 0445 02/15/22 0352 02/16/22 0301 02/17/22 0305 02/18/22 0326  WBC 14.1* 14.6* 12.1* 14.6* 15.3*  NEUTROABS  --   --  8.7* 11.6* 11.9*  HGB 10.9* 11.1* 9.7* 9.5* 10.8*  HCT 36.3* 38.0* 34.6* 34.7* 39.8  MCV 88.3 90.9 93.8 98.6 96.8  PLT 174 180 154 134* 172     Basic Metabolic Panel: Recent Labs  Lab 02/14/22 0445 02/15/22 0352 02/16/22 0301 02/17/22 0305 02/18/22 0326  NA 138 145 145 145 151*  K 4.3 4.0 3.5 3.9 5.0  CL 97* 104 112* 112* 111  CO2 33* 36* 31 27 32  GLUCOSE 316* 174* 95 181* 177*  BUN 47* 46* 38* 36* 72*  CREATININE 1.34* 1.16 0.90 0.84 1.88*  CALCIUM 8.2* 8.3* 7.7* 6.8* 8.3*  MG 2.4 2.4 2.1 2.1 2.7*  PHOS 4.9* 3.9 2.4* 4.6 4.1    GFR: Estimated Creatinine Clearance: 85.4 mL/min (A) (by C-G formula based on SCr  of 1.88 mg/dL (H)). Recent Labs  Lab 02/11/22 2218 02/12/22 0051 02/12/22 0254 02/12/22 0602 02/13/22 0411 02/13/22 1146 02/13/22 1625 02/14/22 0445 02/15/22 0352 02/16/22 0301 02/17/22 0305 02/18/22 0326  PROCALCITON <0.10  --   --   --  <0.10  --   --   --   --   --   --   --   WBC  --   --    < >  --  16.3*  --   --    < > 14.6* 12.1* 14.6* 15.3*  LATICACIDVEN 4.0* 4.1*  --  3.4*  --  2.4* 2.6*  --   --   --   --   --    < > = values in this interval not displayed.     Liver Function Tests: Recent Labs  Lab 02/11/22 2218  AST 23  ALT 20  ALKPHOS 69  BILITOT 0.8  PROT 7.2  ALBUMIN 3.5    ABG    Component Value Date/Time   PHART 7.26 (L) 02/18/2022 0326   PCO2ART PENDING 02/18/2022 0326   PO2ART 66 (L) 02/18/2022 0326   HCO3 35.0 (H) 02/18/2022 0326   TCO2 26 03/17/2007 1822   O2SAT PENDING 02/18/2022 0326     Home Medications  Prior to Admission medications   Medication Sig Start Date End Date Taking? Authorizing Provider  losartan (COZAAR) 100 MG tablet  Take 100 mg by mouth daily. 02/01/22  Yes [provider]  OZEMPIC, 0.25 OR 0.5 MG/DOSE, 2 MG/3ML SOPN SMARTSIG:0.25 Milligram(s) SUB-Q Once a Week 12/09/21  Yes [provider]  torsemide (DEMADEX) 20 MG tablet Take 20 mg by mouth daily. 12/18/21  Yes [provider]  Aspirin-Salicylamide-Caffeine (BC HEADACHE POWDER PO) Take 1 packet by mouth as needed (for pain).    [provider]  cephALEXin (KEFLEX) 500 MG capsule Take 1 capsule (500 mg total) by mouth 4 (four) times daily. Patient not taking: Reported on 02/11/2022 12/16/18   Davonna Belling, MD  cyclobenzaprine (FLEXERIL) 10 MG tablet Take 0.5-1 tablets (5-10 mg total) by mouth 2 (two) times daily as needed for muscle spasms. Patient not taking: Reported on 02/11/2022 03/12/18   Margarita Mail, PA-C  gabapentin (NEURONTIN) 600 MG tablet Take 600 mg by mouth at bedtime. Patient not taking: Reported on 02/11/2022 10/13/21    [provider]  LABETALOL HCL PO Take by mouth. Patient not taking: Reported on 02/11/2022    [provider]  meloxicam (MOBIC) 15 MG tablet Take 1 tablet (15 mg total) by mouth daily. Take 1 daily with food. Patient not taking: Reported on 02/11/2022 03/12/18   Margarita Mail, PA-C  metFORMIN (GLUCOPHAGE) 500 MG tablet Take 500 mg by mouth 2 (two) times daily with a meal. Patient not taking: Reported on 02/11/2022    [provider]  METFORMIN HCL ER, MOD, PO Take by mouth. Patient not taking: Reported on 02/11/2022    [provider]  METOPROLOL SUCCINATE ER PO Take by mouth. Patient not taking: Reported on 02/11/2022    [provider]  oxyCODONE-acetaminophen (PERCOCET) 10-325 MG tablet Take 1 tablet by mouth See admin instructions. Take 1 tablet by mouth 4-5 times a day as needed for pain Patient not taking: Reported on 02/11/2022    [provider]  oxyCODONE-acetaminophen (PERCOCET/ROXICET) 5-325 MG tablet Take 1 tablet by mouth every 8 (eight) hours as needed for severe pain. Patient not taking: Reported on 02/11/2022 12/16/18   Davonna Belling, MD  predniSONE (DELTASONE) 20 MG tablet Take 2 tablets (40 mg total) by mouth daily. Patient not taking: Reported on 02/11/2022 04/04/15   Davonna Belling, MD   Hospital Scheduled Meds:  artificial tears  1 Application Both Eyes G9Q   budesonide (PULMICORT) nebulizer solution  0.5 mg Nebulization BID   Chlorhexidine Gluconate Cloth  6 each Topical Q0600   enoxaparin (LOVENOX) injection  1 mg/kg Subcutaneous Q12H   feeding supplement (PROSource TF)  90 mL Per Tube 5 X Daily   free water  100 mL Per Tube Q4H   insulin aspart  0-20 Units Subcutaneous Q4H   insulin aspart  8 Units Subcutaneous Q4H   insulin glargine-yfgn  26 Units Subcutaneous Daily   insulin starter kit- pen needles  1 kit Other Once   ipratropium-albuterol  3 mL Nebulization Q6H   living well with diabetes book   Does not  apply Once   mouth rinse  15 mL Mouth Rinse Q2H   oxyCODONE  10 mg Per Tube Q6H   pantoprazole sodium  40 mg Per Tube QHS   phenobarbital  97.2 mg Per Tube TID   polyethylene glycol  17 g Per Tube Daily   senna-docusate  1 tablet Per Tube BID   sodium chloride flush  10-40 mL Intracatheter Q12H   sodium chloride flush  3 mL Intravenous Q12H   Continuous Infusions:  sodium chloride  sodium chloride Stopped (02/11/22 2011)   cefTRIAXone (ROCEPHIN)  IV Stopped (02/17/22 2159)   feeding supplement (VITAL 1.5 CAL) 45 mL/hr at 02/18/22 0800   fentaNYL infusion INTRAVENOUS 300 mcg/hr (02/18/22 0800)   ketamine (KETALAR) 2,500 mg in sodium chloride 0.9 % 500 mL (5 mg/mL) infusion 3 mg/kg/hr (02/18/22 0847)   midazolam 10 mg/hr (02/18/22 0800)   vecuronium (NORCURON) infusion 100mg /122mL (1 mg/mL) 0.4 mcg/kg/min (02/18/22 0800)   PRN Meds:.sodium chloride, acetaminophen **OR** acetaminophen, cyclobenzaprine, docusate sodium, fentaNYL, midazolam, ondansetron **OR** ondansetron (ZOFRAN) IV, mouth rinse, polyethylene glycol, sodium chloride flush, sodium chloride flush, traZODone      Active Hospital Problem list    Patient Active Problem List   Diagnosis Date Noted   Acute respiratory failure ([place]) 02/11/2022   Respiratory failure ([place]) 02/11/2022   Acute respiratory failure with hypoxia and hypercarbia ([facility]) 02/11/2022   Acute on chronic systolic CHF (congestive heart failure) ([place]) 02/11/2022   Hypertensive urgency 02/11/2022   Type 2 diabetes mellitus with peripheral neuropathy ([place]) 02/11/2022   OBSTRUCTIVE SLEEP APNEA 04/30/2009    Assessment & Plan:   46 yo morbidly obese AAM with severe decompensated acute systolic and diastolic heart failure with previously documented severe OSA/OHS, noncompliant with CPAP, active smoker, leading to severe hypoxic resp failure with encephalopathy and difficulty with sedation  Severe acute on chronic hypoxic and hypercapnic respiratory  failure due to CAP and acute decompensation of chronic heart failure Persistent hypoxemia despite trial of several ventilator strategies continue Mechanical Ventilator support Wean Fio2 and PEEP as tolerated Continue VAP/VENT bundles  Wean PEEP & FiO2 as tolerated, maintain SpO2 > 88% Head of bed elevated 30 degrees, VAP protocol in place Plateau pressures less than 30 cm H20 as able Intermittent chest x-ray & ABG PRN Ensure adequate pulmonary hygiene  will NOT perform SAT/SBT due to high FiO2 requirements, asynchrony off sedation  Requiring paralytic infusion for ventilator synchrony Recruitment maneuvers every 4h and as needed. On heparin infusion as cannot exclude PE, however no DVT on LE Dopplers assuring, Patient cannot be proned, due to weight limit for safe proning and unstable airway (ET tube with constant need of repositioning)  Community-acquired pneumonia: Streptococcus pneumoniae and Haemophilus influenzae Fever spike today Sputum re-cultured Changed antibiotics to ceftaz today -  Acute on chronic systolic and diastolic heart failure LVEF 40 to 45% consistent with prior studies performed at [place] ventilator support Lasix twice daily IV Trial of milrinone Creatinine 0.9 today Diamox 500 mg x 1 Hold metoprolol and Losartan due to sedation related hypotension 2D Echocardiogram shows diastolic dysfunction and enlarged left and right atria consistent with restrictive physiology  ACUTE KIDNEY INJURY/Renal Failure -continues to improve Element of cardiorenal syndrome -continue Foley catheter-assess need -Avoid nephrotoxic agents -Follow urine output, BMP -Ensure adequate renal perfusion, optimize oxygenation -Renal dose medications -Nephrology has signed off -On Lasix twice daily -Added milrinone   Intake/Output Summary (Last 24 hours) at 02/18/2022 0913 Last data filed at 02/18/2022 0800 Gross per 24 hour  Intake 4260.04 ml  Output 3110 ml   Net 1150.04 ml        Latest Ref Rng & Units 02/18/2022    3:26 AM 02/17/2022    3:05 AM 02/16/2022    3:01 AM  BMP  Glucose 70 - 99 mg/dL 177  181  95   BUN 6 - 20 mg/dL 72  36  38   Creatinine 0.61 - 1.24 mg/dL 1.88  0.84  0.90   Sodium 135 -  145 mmol/L 151  145  145   Potassium 3.5 - 5.1 mmol/L 5.0  3.9  3.5   Chloride 98 - 111 mmol/L 111  112  112   CO2 22 - 32 mmol/L 32  27  31   Calcium 8.9 - 10.3 mg/dL 8.3  6.8  7.7     ENDO - ICU hypoglycemic\Hyperglycemia protocol -check FSBS per protocol -Diabetes mellitus -HgbA1c  10.1 -CBG's AC & hs; Target range of 140 to 180 -SSI -Follow ICU Hypo/Hyperglycemia protocol -Hold Metformin  GI GI PROPHYLAXIS PPI  NUTRITIONAL STATUS DIET-->TF's as tolerated Constipation protocol as indicated  ACUTE ANEMIA- Transfuse as needed Consider transfusion  if hgb<7  Best practice:  Diet: Tube feeds Pain/Anxiety/Delirium protocol (if indicated): Yes (RASS goal 0) VAP protocol (if indicated): Yes DVT prophylaxis: Systemic AC GI prophylaxis: PPI Glucose control:  SSI Yes Central venous access:  N/A Arterial line:  N/A Foley:  Yes, and it is still needed Mobility:  bed rest  PT consulted: N/A Last date of multidisciplinary goals of care discussion [7/25] Code Status:  full code Disposition: ICU   Critical care provider statement:   Total critical care time: 33 minutes   Performed by: Lanney Gins MD   Critical care time was exclusive of separately billable procedures and treating other patients.   Critical care was necessary to treat or prevent imminent or life-threatening deterioration.   Critical care was time spent personally by me on the following activities: development of treatment plan with patient and/or surrogate as well as nursing, discussions with consultants, evaluation of patient's response to treatment, examination of patient, obtaining history from patient or surrogate, ordering and performing treatments and  interventions, ordering and review of laboratory studies, ordering and review of radiographic studies, pulse oximetry and re-evaluation of patient's condition.    Ottie Glazier, M.D.  Pulmonary & Critical Care Medicine

## 2022-02-18 NOTE — Progress Notes (Signed)
Recruitment Maneuver (PSV 30/30 100% for 30 sec) completed  x 2

## 2022-02-18 NOTE — Progress Notes (Signed)
Recruitment Maneuver completed  PC 0/40 R4 100% for 30 sec x3 with 2 min break between maneuvers

## 2022-02-18 NOTE — Progress Notes (Signed)
Recruitment maneuver completed  PC 0/30 for R4 100% 40 sec x 3 with 2 min break in between

## 2022-02-18 NOTE — Progress Notes (Signed)
Recruitment Maneuver done    PCV 40 ,RR 10,PEEP +5, 100%, I:E 1:1  X 2 MIN

## 2022-02-19 ENCOUNTER — Inpatient Hospital Stay: Payer: Medicaid Other

## 2022-02-19 DIAGNOSIS — J9601 Acute respiratory failure with hypoxia: Secondary | ICD-10-CM | POA: Diagnosis not present

## 2022-02-19 DIAGNOSIS — R609 Edema, unspecified: Secondary | ICD-10-CM | POA: Diagnosis not present

## 2022-02-19 DIAGNOSIS — Z515 Encounter for palliative care: Secondary | ICD-10-CM | POA: Diagnosis not present

## 2022-02-19 DIAGNOSIS — Z7189 Other specified counseling: Secondary | ICD-10-CM | POA: Diagnosis not present

## 2022-02-19 LAB — BLOOD GAS, ARTERIAL
Acid-Base Excess: 7 mmol/L — ABNORMAL HIGH (ref 0.0–2.0)
Acid-Base Excess: 6.3 mmol/L — ABNORMAL HIGH (ref 0.0–2.0)
Acid-Base Excess: 8.4 mmol/L — ABNORMAL HIGH (ref 0.0–2.0)
Acid-Base Excess: 7.6 mmol/L — ABNORMAL HIGH (ref 0.0–2.0)
Acid-Base Excess: 5.7 mmol/L — ABNORMAL HIGH (ref 0.0–2.0)
Acid-Base Excess: 7 mmol/L — ABNORMAL HIGH (ref 0.0–2.0)
Acid-Base Excess: 5.1 mmol/L — ABNORMAL HIGH (ref 0.0–2.0)
Bicarbonate: 36.7 mmol/L — ABNORMAL HIGH (ref 20.0–28.0)
Bicarbonate: 35.7 mmol/L — ABNORMAL HIGH (ref 20.0–28.0)
Bicarbonate: 37.9 mmol/L — ABNORMAL HIGH (ref 20.0–28.0)
Bicarbonate: 36.9 mmol/L — ABNORMAL HIGH (ref 20.0–28.0)
Bicarbonate: 35 mmol/L — ABNORMAL HIGH (ref 20.0–28.0)
Bicarbonate: 37.2 mmol/L — ABNORMAL HIGH (ref 20.0–28.0)
Bicarbonate: 34.3 mmol/L — ABNORMAL HIGH (ref 20.0–28.0)
FIO2: 100 %
FIO2: 100 %
FIO2: 100 %
FIO2: 100 %
FIO2: 100 %
MECHVT: 530 mL
MECHVT: 500 mL
MECHVT: 500 mL
MECHVT: 500 mL
MECHVT: 500 mL
MECHVT: 500 mL
MECHVT: 500 mL
Mechanical Rate: 20
Mechanical Rate: 30
Mechanical Rate: 30
Mechanical Rate: 30
Mechanical Rate: 30
O2 Saturation: 94.6 %
O2 Saturation: 65.8 %
O2 Saturation: 92.1 %
O2 Saturation: 91.7 %
O2 Saturation: 91.7 %
O2 Saturation: 79 %
O2 Saturation: 92 %
PEEP: 10 cmH2O
PEEP: 18 cmH2O
PEEP: 18 cmH2O
PEEP: 18 cmH2O
PEEP: 18 cmH2O
PEEP: 18 cmH2O
PEEP: 18 cmH2O
Patient temperature: 37
Patient temperature: 37
Patient temperature: 37
Patient temperature: 37
Patient temperature: 37
Patient temperature: 37
Patient temperature: 37
RATE: 30 resp/min
RATE: 30 resp/min
pCO2 arterial: 78 mmHg (ref 32–48)
pCO2 arterial: 76 mmHg (ref 32–48)
pCO2 arterial: 77 mmHg (ref 32–48)
pCO2 arterial: 75 mmHg (ref 32–48)
pCO2 arterial: 78 mmHg (ref 32–48)
pCO2 arterial: 83 mmHg (ref 32–48)
pCO2 arterial: 73 mmHg (ref 32–48)
pH, Arterial: 7.28 — ABNORMAL LOW (ref 7.35–7.45)
pH, Arterial: 7.28 — ABNORMAL LOW (ref 7.35–7.45)
pH, Arterial: 7.3 — ABNORMAL LOW (ref 7.35–7.45)
pH, Arterial: 7.3 — ABNORMAL LOW (ref 7.35–7.45)
pH, Arterial: 7.26 — ABNORMAL LOW (ref 7.35–7.45)
pH, Arterial: 7.26 — ABNORMAL LOW (ref 7.35–7.45)
pH, Arterial: 7.28 — ABNORMAL LOW (ref 7.35–7.45)
pO2, Arterial: 71 mmHg — ABNORMAL LOW (ref 83–108)
pO2, Arterial: 40 mmHg — CL (ref 83–108)
pO2, Arterial: 65 mmHg — ABNORMAL LOW (ref 83–108)
pO2, Arterial: 64 mmHg — ABNORMAL LOW (ref 83–108)
pO2, Arterial: 66 mmHg — ABNORMAL LOW (ref 83–108)
pO2, Arterial: 50 mmHg — ABNORMAL LOW (ref 83–108)
pO2, Arterial: 64 mmHg — ABNORMAL LOW (ref 83–108)

## 2022-02-19 LAB — BASIC METABOLIC PANEL
Anion gap: 8 (ref 5–15)
BUN: 94 mg/dL — ABNORMAL HIGH (ref 6–20)
CO2: 30 mmol/L (ref 22–32)
Calcium: 8.2 mg/dL — ABNORMAL LOW (ref 8.9–10.3)
Chloride: 113 mmol/L — ABNORMAL HIGH (ref 98–111)
Creatinine, Ser: 2.61 mg/dL — ABNORMAL HIGH (ref 0.61–1.24)
GFR, Estimated: 30 mL/min — ABNORMAL LOW (ref >60)
Glucose, Bld: 246 mg/dL — ABNORMAL HIGH (ref 70–99)
Potassium: 5.7 mmol/L — ABNORMAL HIGH (ref 3.5–5.1)
Sodium: 151 mmol/L — ABNORMAL HIGH (ref 135–145)

## 2022-02-19 LAB — CBC WITH DIFFERENTIAL/PLATELET
Abs Immature Granulocytes: 0.15 10*3/uL — ABNORMAL HIGH (ref 0.00–0.07)
Basophils Absolute: 0 10*3/uL (ref 0.0–0.1)
Basophils Relative: 0 %
Eosinophils Absolute: 0 10*3/uL (ref 0.0–0.5)
Eosinophils Relative: 0 %
HCT: 37.4 % — ABNORMAL LOW (ref 39.0–52.0)
Hemoglobin: 10.1 g/dL — ABNORMAL LOW (ref 13.0–17.0)
Immature Granulocytes: 1 %
Lymphocytes Relative: 5 %
Lymphs Abs: 0.7 10*3/uL (ref 0.7–4.0)
MCH: 26.1 pg (ref 26.0–34.0)
MCHC: 27 g/dL — ABNORMAL LOW (ref 30.0–36.0)
MCV: 96.6 fL (ref 80.0–100.0)
Monocytes Absolute: 1.2 10*3/uL — ABNORMAL HIGH (ref 0.1–1.0)
Monocytes Relative: 9 %
Neutro Abs: 10.3 10*3/uL — ABNORMAL HIGH (ref 1.7–7.7)
Neutrophils Relative %: 85 %
Platelets: 181 10*3/uL (ref 150–400)
RBC: 3.87 MIL/uL — ABNORMAL LOW (ref 4.22–5.81)
RDW: 15.9 % — ABNORMAL HIGH (ref 11.5–15.5)
WBC: 12.3 10*3/uL — ABNORMAL HIGH (ref 4.0–10.5)
nRBC: 0.5 % — ABNORMAL HIGH (ref 0.0–0.2)

## 2022-02-19 LAB — POTASSIUM
Potassium: 5.5 mmol/L — ABNORMAL HIGH (ref 3.5–5.1)
Potassium: 5.8 mmol/L — ABNORMAL HIGH (ref 3.5–5.1)

## 2022-02-19 LAB — GLUCOSE, CAPILLARY
Glucose-Capillary: 173 mg/dL — ABNORMAL HIGH (ref 70–99)
Glucose-Capillary: 194 mg/dL — ABNORMAL HIGH (ref 70–99)
Glucose-Capillary: 212 mg/dL — ABNORMAL HIGH (ref 70–99)
Glucose-Capillary: 219 mg/dL — ABNORMAL HIGH (ref 70–99)
Glucose-Capillary: 290 mg/dL — ABNORMAL HIGH (ref 70–99)
Glucose-Capillary: 295 mg/dL — ABNORMAL HIGH (ref 70–99)

## 2022-02-19 LAB — BLOOD GAS, VENOUS
Acid-Base Excess: 9.4 mmol/L — ABNORMAL HIGH (ref 0.0–2.0)
Bicarbonate: 40 mmol/L — ABNORMAL HIGH (ref 20.0–28.0)
O2 Saturation: 48.7 %
Patient temperature: 37
pCO2, Ven: 87 mmHg (ref 44–60)
pH, Ven: 7.27 (ref 7.25–7.43)

## 2022-02-19 LAB — MAGNESIUM: Magnesium: 2.9 mg/dL — ABNORMAL HIGH (ref 1.7–2.4)

## 2022-02-19 LAB — PROCALCITONIN: Procalcitonin: 8.62 ng/mL

## 2022-02-19 LAB — D-DIMER, QUANTITATIVE: D-Dimer, Quant: 2.7 ug/mL-FEU — ABNORMAL HIGH (ref 0.00–0.50)

## 2022-02-19 LAB — HEPARIN LEVEL (UNFRACTIONATED): Heparin Unfractionated: 0.44 IU/mL (ref 0.30–0.70)

## 2022-02-19 LAB — C-REACTIVE PROTEIN: CRP: 36.3 mg/dL — ABNORMAL HIGH (ref <1.0)

## 2022-02-19 LAB — CK: Total CK: 663 U/L — ABNORMAL HIGH (ref 49–397)

## 2022-02-19 LAB — HEPARIN ANTI-XA: Heparin LMW: 0.27 IU/mL

## 2022-02-19 LAB — PHOSPHORUS: Phosphorus: 3.4 mg/dL (ref 2.5–4.6)

## 2022-02-19 MED ORDER — VECURONIUM BROMIDE 10 MG IV SOLR
10.0000 mg | Freq: Once | INTRAVENOUS | Status: AC
  Administered 2022-02-19: 10 mg via INTRAVENOUS

## 2022-02-19 MED ORDER — SODIUM ZIRCONIUM CYCLOSILICATE 5 G PO PACK
10.0000 g | PACK | Freq: Once | ORAL | Status: AC
  Administered 2022-02-19: 10 g
  Filled 2022-02-19: qty 2

## 2022-02-19 MED ORDER — METHYLPREDNISOLONE SODIUM SUCC 40 MG IJ SOLR
40.0000 mg | Freq: Two times a day (BID) | INTRAMUSCULAR | Status: DC
  Administered 2022-02-19 – 2022-02-20 (×2): 40 mg via INTRAVENOUS
  Filled 2022-02-19 (×2): qty 1

## 2022-02-19 MED ORDER — VECURONIUM BROMIDE 10 MG IV SOLR
INTRAVENOUS | Status: AC
  Filled 2022-02-19: qty 10

## 2022-02-19 MED ORDER — HEPARIN (PORCINE) 25000 UT/250ML-% IV SOLN
2000.0000 [IU]/h | INTRAVENOUS | Status: DC
Start: 2022-02-19 — End: 2022-02-21
  Administered 2022-02-19 – 2022-02-21 (×4): 2000 [IU]/h via INTRAVENOUS
  Filled 2022-02-19 (×4): qty 250

## 2022-02-19 MED ORDER — SODIUM POLYSTYRENE SULFONATE 15 GM/60ML PO SUSP
45.0000 g | Freq: Two times a day (BID) | ORAL | Status: AC
Start: 2022-02-19 — End: 2022-02-19
  Administered 2022-02-19 (×2): 45 g
  Filled 2022-02-19 (×2): qty 180

## 2022-02-19 MED ORDER — LACTULOSE 10 GM/15ML PO SOLN
20.0000 g | Freq: Two times a day (BID) | ORAL | Status: DC
  Administered 2022-02-19: 20 g
  Filled 2022-02-19: qty 30

## 2022-02-19 NOTE — Progress Notes (Signed)
   02/19/22 1320  Clinical Encounter Type  Visited With Health care provider   Chaplain Katilyn Miltenberger came by to check on family well-being. Spoke with Judson Roch, RN and offered my on-going support for family and care team.  Will continue to follow.

## 2022-02-19 NOTE — Progress Notes (Signed)
[place], [place] 02/19/22  Subjective:   Hospital day # 8  [name] is a 46 y.o.  male with past medical history of diabetes, hypertension, sCHF, OSA, and obesity, who was admitted to [facility] on 02/11/2022 for Acute respiratory failure ([place]) [J96.00] Respiratory failure ([place]) [J96.90] Peripheral edema [R60.9] Acute respiratory failure with hypoxia and hypercapnia ([place]) [J96.01, J96.02]  We have been re-consulted for elevated renal function. We were consulted during the initial admission for acute kidney injury which was corrected with IV Furosemide. Creatinine began to increase on 02/18/22. No exposure to IV contrast, hypotension, or nephrotoxic medications. Urine output remains adequate, 2.6L. No edema.   Remains sedated on Fentanyl, ketamine and versed No pressors Vent- 100%FiO2 with 18 PEEP Tube feeds at 65ml/hr Foley placed  Renal: 08/01 0701 - 08/02 0700 In: 5153.5 [I.V.:2523.7; NG/GT:2430; IV Piggyback:199.9] Out: 2645 [Urine:2645] Lab Results  Component Value Date   CREATININE 2.61 (H) 02/19/2022   CREATININE 1.64 (H) 02/18/2022   CREATININE 1.88 (H) 02/18/2022     Objective:  Vital signs in last 24 hours:  Temp:  [97.7 F (36.5 C)-101.7 F (38.7 C)] 99.9 F (37.7 C) (08/02 1100) Pulse Rate:  [98-112] 98 (08/02 1000) Resp:  [16-30] 30 (08/02 1000) BP: (113-154)/(56-81) 113/68 (08/02 1000) SpO2:  [62 %-100 %] 100 % (08/02 1107) Arterial Line BP: (100-152)/(64-85) 118/70 (08/02 0900) FiO2 (%):  [100 %] 100 % (08/02 1107) Weight:  [078 kg] 197 kg (08/02 0400)  Weight change: 2 kg Filed Weights   02/17/22 0410 02/18/22 0454 02/19/22 0400  Weight: (!) 196 kg (!) 195 kg (!) 197 kg    Intake/Output:    Intake/Output Summary (Last 24 hours) at 02/19/2022 1128 Last data filed at 02/19/2022 1100 Gross per 24 hour  Intake 5133.62 ml  Output 1825 ml  Net 3308.62 ml      Physical Exam: General: Critically ill-appearing,  laying in the bed  HEENT ET tube, OG tube in place  Pulm/lungs Ventilator assisted, coarse breath sounds  CVS/Heart Regular  Abdomen:  Soft, nontender, nondistended  Extremities: Trace dependent edema  Neurologic: Sedated  Skin: Warm, dry  Access:        Basic Metabolic Panel:  Recent Labs  Lab 02/15/22 0352 02/16/22 0301 02/17/22 0305 02/18/22 0326 02/18/22 1331 02/19/22 0412  NA 145 145 145 151* 151* 151*  K 4.0 3.5 3.9 5.0 5.3* 5.7*  CL 104 112* 112* 111 114* 113*  CO2 36* 31 27 32 31 30  GLUCOSE 174* 95 181* 177* 222* 246*  BUN 46* 38* 36* 72* 72* 94*  CREATININE 1.16 0.90 0.84 1.88* 1.64* 2.61*  CALCIUM 8.3* 7.7* 6.8* 8.3* 8.4* 8.2*  MG 2.4 2.1 2.1 2.7*  --  2.9*  PHOS 3.9 2.4* 4.6 4.1  --  3.4      CBC: Recent Labs  Lab 02/15/22 0352 02/16/22 0301 02/17/22 0305 02/18/22 0326 02/19/22 0412  WBC 14.6* 12.1* 14.6* 15.3* 12.3*  NEUTROABS  --  8.7* 11.6* 11.9* 10.3*  HGB 11.1* 9.7* 9.5* 10.8* 10.1*  HCT 38.0* 34.6* 34.7* 39.8 37.4*  MCV 90.9 93.8 98.6 96.8 96.6  PLT 180 154 134* 172 181      No results found for: "HEPBSAG", "HEPBSAB", "HEPBIGM"    Microbiology:  Recent Results (from the past 240 hour(s))  SARS Coronavirus 2 by RT PCR (hospital order, performed in [facility] hospital lab) *cepheid single result test* Anterior Nasal Swab     Status: None  Collection Time: 02/11/22  4:33 PM   Specimen: Anterior Nasal Swab  Result Value Ref Range Status   SARS Coronavirus 2 by RT PCR NEGATIVE NEGATIVE Final    Comment: (NOTE) SARS-CoV-2 target nucleic acids are NOT DETECTED.  The SARS-CoV-2 RNA is generally detectable in upper and lower respiratory specimens during the acute phase of infection. The lowest concentration of SARS-CoV-2 viral copies this assay can detect is 250 copies / mL. A negative result does not preclude SARS-CoV-2 infection and should not be used as the sole basis for treatment or other patient management decisions.  A  negative result may occur with improper specimen collection / handling, submission of specimen other than nasopharyngeal swab, presence of viral mutation(s) within the areas targeted by this assay, and inadequate number of viral copies (<250 copies / mL). A negative result must be combined with clinical observations, patient history, and epidemiological information.  Fact Sheet for Patients:   https://www.patel.info/  Fact Sheet for Healthcare Providers: https://hall.com/  This test is not yet approved or  cleared by the [place] FDA and has been authorized for detection and/or diagnosis of SARS-CoV-2 by FDA under an Emergency Use Authorization (EUA).  This EUA will remain in effect (meaning this test can be used) for the duration of the COVID-19 declaration under Section 564(b)(1) of the Act, 21 U.S.C. section 360bbb-3(b)(1), unless the authorization is terminated or revoked sooner.  Performed at [facility], [address] [place], [place] [address]   MRSA Next Gen by PCR, Nasal     Status: None   Collection Time: 02/11/22  5:48 PM   Specimen: Nasal Mucosa; Nasal Swab  Result Value Ref Range Status   MRSA by PCR Next Gen NOT DETECTED NOT DETECTED Final    Comment: (NOTE) The GeneXpert MRSA Assay (FDA approved for NASAL specimens only), is one component of a comprehensive MRSA colonization surveillance program. It is not intended to diagnose MRSA infection nor to guide or monitor treatment for MRSA infections. Test performance is not FDA approved in patients less than 41 years old. Performed at [facility], [address] [place], [place] [address]   Culture, Respiratory w Gram Stain     Status: None   Collection Time: 02/13/22  9:16 PM   Specimen: Tracheal Aspirate; Respiratory  Result Value Ref Range Status   Specimen Description   Final    TRACHEAL ASPIRATE Performed at [facility], [address] [place], [place] [address]    Special Requests   Final    NONE Performed at [facility], [address] [place], [place] [address]    Gram Stain   Final    NO SQUAMOUS EPITHELIAL CELLS SEEN FEW WBC SEEN FEW GRAM NEGATIVE RODS MODERATE GRAM POSITIVE COCCI    Culture   Final    ABUNDANT STREPTOCOCCUS PNEUMONIAE ABUNDANT HAEMOPHILUS INFLUENZAE BETA LACTAMASE NEGATIVE NO STAPHYLOCOCCUS AUREUS ISOLATED No Pseudomonas species isolated Performed at [facility], [place] [address] [place], [place] [address]    Report Status 02/17/2022 FINAL  Final   Organism ID, Bacteria STREPTOCOCCUS PNEUMONIAE  Final      Susceptibility   Streptococcus pneumoniae - MIC*    ERYTHROMYCIN <=0.12 SENSITIVE Sensitive     LEVOFLOXACIN 0.5 SENSITIVE Sensitive     VANCOMYCIN 0.5 SENSITIVE Sensitive     PENICILLIN (meningitis) <=0.06 SENSITIVE Sensitive     PENO - penicillin <=0.06      PENICILLIN (non-meningitis) <=0.06 SENSITIVE Sensitive  PENICILLIN (oral) <=0.06 SENSITIVE Sensitive     CEFTRIAXONE (non-meningitis) <=0.12 SENSITIVE Sensitive     CEFTRIAXONE (meningitis) <=0.12 SENSITIVE Sensitive     * ABUNDANT STREPTOCOCCUS PNEUMONIAE  Culture, Respiratory w Gram Stain     Status: None (Preliminary result)   Collection Time: 02/16/22  4:27 PM   Specimen: Tracheal Aspirate; Respiratory  Result Value Ref Range Status   Specimen Description   Final    TRACHEAL ASPIRATE Performed at [facility], [address] [place], [place] [address]    Special Requests   Final    NONE Performed at [facility], [address] [place], [place] [address]    Gram Stain   Final    RARE WBC PRESENT,BOTH PMN AND MONONUCLEAR NO ORGANISMS SEEN    Culture   Final    NO GROWTH 2 DAYS Performed at [facility], [place] [address] [place], [place] [address]    Report Status PENDING  Incomplete    Coagulation Studies: No results for input(s): "LABPROT", "INR"  in the last 72 hours.  Urinalysis: No results for input(s): "COLORURINE", "LABSPEC", "PHURINE", "GLUCOSEU", "HGBUR", "BILIRUBINUR", "KETONESUR", "PROTEINUR", "UROBILINOGEN", "NITRITE", "LEUKOCYTESUR" in the last 72 hours.  Invalid input(s): "APPERANCEUR"    Imaging: DG Chest Port 1 View  Result Date: 02/19/2022 CLINICAL DATA:  Pulmonary edema EXAM: PORTABLE CHEST 1 VIEW COMPARISON:  Chest x-ray dated February 17, 2022 FINDINGS: Stable position of ET tube, enteric tube and right arm PICC. Cardiac and mediastinal contours are not visualized. New near complete opacification of the left hemithorax, likely due to lobar collapse. Decreased consolidation of the right upper lobe. No definite pleural effusion pneumothorax. IMPRESSION: 1. New near complete opacification of the left hemithorax, likely due to lobar collapse. 2. Decreased consolidation of the right upper lobe. Electronically Signed   By: Yetta Glassman M.D.   On: 02/19/2022 08:43   DG Abd 1 View  Result Date: 02/18/2022 CLINICAL DATA:  Orogastric tube placement. EXAM: ABDOMEN - 1 VIEW COMPARISON:  02/12/2019 FINDINGS: Moderate to markedly limited exam secondary to patient's size and technique. Multiple overlying wires and leads. A nasogastric tube is followed to the level of the gastric body. IMPRESSION: Nasogastric tube terminates at the gastric body. Otherwise, severely limited exam. Electronically Signed   By: Abigail Miyamoto M.D.   On: 02/18/2022 14:39   DG Chest Port 1 View  Result Date: 02/17/2022 CLINICAL DATA:  Intubation, oxygen saturation changes EXAM: PORTABLE CHEST 1 VIEW COMPARISON:  Portable exam 1059 hours compared to 02/16/2022 FINDINGS: Tip of endotracheal tube projects 11.4 cm above carina. Nasogastric tube extends into stomach. Enlargement of cardiac silhouette. Significantly increased RIGHT upper lobe infiltrate consistent with pneumonia. Persistent atelectasis or infiltrate in LEFT lung. No pleural effusion or pneumothorax.  IMPRESSION: Significantly increased RIGHT upper lobe infiltrate consistent with pneumonia. Persistent atelectasis versus consolidation LEFT lower lobe. Electronically Signed   By: Lavonia Dana M.D.   On: 02/17/2022 11:46     Medications:    sodium chloride 250 mL (02/19/22 1011)   sodium chloride Stopped (02/11/22 2011)   cefTRIAXone (ROCEPHIN)  IV Stopped (02/19/22 1043)   feeding supplement (VITAL 1.5 CAL) 45 mL/hr at 02/19/22 1100   fentaNYL infusion INTRAVENOUS 300 mcg/hr (02/19/22 1100)   ketamine (KETALAR) 2,500 mg in sodium chloride 0.9 % 500 mL (5 mg/mL) infusion 2.9 mg/kg/hr (02/19/22 1100)   midazolam 10 mg/hr (02/19/22 1100)    artificial tears  1 Application Both Eyes T0Y   budesonide (PULMICORT) nebulizer  solution  0.5 mg Nebulization BID   Chlorhexidine Gluconate Cloth  6 each Topical Q0600   enoxaparin (LOVENOX) injection  1 mg/kg Subcutaneous Q12H   feeding supplement (PROSource TF)  90 mL Per Tube 5 X Daily   free water  100 mL Per Tube Q4H   insulin aspart  0-20 Units Subcutaneous Q4H   insulin aspart  8 Units Subcutaneous Q4H   insulin glargine-yfgn  26 Units Subcutaneous Daily   insulin starter kit- pen needles  1 kit Other Once   ipratropium-albuterol  3 mL Nebulization Q6H   lactulose  20 g Per Tube BID   living well with diabetes book   Does not apply Once   methylPREDNISolone (SOLU-MEDROL) injection  40 mg Intravenous BID   mouth rinse  15 mL Mouth Rinse Q2H   oxyCODONE  10 mg Per Tube Q6H   pantoprazole sodium  40 mg Per Tube QHS   phenobarbital  97.2 mg Per Tube TID   polyethylene glycol  17 g Per Tube Daily   senna-docusate  1 tablet Per Tube BID   sodium chloride flush  10-40 mL Intracatheter Q12H   sodium chloride flush  3 mL Intravenous Q12H   sodium chloride, acetaminophen **OR** acetaminophen, cyclobenzaprine, docusate sodium, fentaNYL, midazolam, ondansetron **OR** ondansetron (ZOFRAN) IV, mouth rinse, polyethylene glycol, sodium chloride flush,  sodium chloride flush, traZODone  Assessment/ Plan:  46 y.o. male with medical problems of poorly controlled diabetes, hypertension, systolic CHF, obstructive sleep apnea, obesity    admitted on 02/11/2022 for Acute respiratory failure (HCC) [J96.00] Respiratory failure (HCC) [J96.90] Peripheral edema [R60.9] Acute respiratory failure with hypoxia and hypercapnia (HCC) [J96.01, J96.02]  Acute kidney injury with hyperkalemia/hypernatremia.  Baseline creatinine 0.96 from February 11, 2022. Suspected secondary to elevated CK. No IV contrast exposure or nephrotoxic agents. CK 663, No obvious cause. Elevated potassium, 5.7, treated with Lokelma 10 g.  Suggest changing tube feeds to Nepro or other low potassium source.  May consider increasing water flushes to manage elevated sodium.  IV fluids would help clear CK, but will monitor for now.  Palliative care consulted and ongoing goals of care discussions underway.  Acute respiratory failure Ventilator assisted.  Management as per ICU team.  Still requiring high dose of FiO2.  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  Glucose remains elevated however receiving steroid therapy.    LOS: [place] 8/2/202311:28 [place], [place]  Note: This note was prepared with Dragon dictation. Any transcription errors are unintentional

## 2022-02-19 NOTE — Consult Note (Signed)
[facility] for IV Heparin Indication: pulmonary embolus (rule-out / work-up pending)  Patient Measurements: Height: 5' 9.02" (175.3 cm) Weight: (!) 197 kg (434 lb 4.9 oz) IBW/kg (Calculated) : 70.74 Heparin Dosing Weight: 121 kg  Labs: Recent Labs    02/17/22 0305 02/17/22 1651 02/18/22 0326 02/18/22 1331 02/19/22 0412 02/19/22 0920 02/19/22 2124  HGB 9.5*  --  10.8*  --  10.1*  --   --   HCT 34.7*  --  39.8  --  37.4*  --   --   PLT 134*  --  172  --  181  --   --   HEPARINUNFRC 0.40  --   --   --   --   --  0.44  HEPRLOWMOCWT  --  0.67  --   --   --  0.27  --   CREATININE 0.84  --  1.88* 1.64* 2.61*  --   --   CKTOTAL  --   --   --   --  663*  --   --      Estimated Creatinine Clearance: 61.9 mL/min (A) (by C-G formula based on SCr of 2.61 mg/dL (H)).   Medical History: Past Medical History:  Diagnosis Date   Diabetes mellitus without complication ([place])    Hypertension    Pancreatitis     Medications:  --No anticoagulation prior to admission per my chart review --Patient was on therapeutic IV heparin from 7/29 thru 7/31  --Patient was on therapeutic Lovenox from 7/31 thru 8/2  Assessment: Patient is a 46 y/o M with medical history including DM, HTN, pancreatitis, tobacco use disorder, systolic CHF, OSA, morbid obesity who is admitted with severe respiratory failure requiring mechanical ventilatory support. There is concern for pulmonary embolism. Work-up is still pending. Pharmacy consulted to re-initiate IV heparin from therapeutic Lovenox partly secondary to AKI / concern for reduced clearance.   Patient was previously on IV heparin this admission and was therapeutic at 2000 units/hr  Goal of Therapy:  Heparin level 0.3-0.7 units/ml Monitor platelets by anticoagulation protocol: Yes   Plan:  --8/2@2124 : HL 0.44, therapeutic x 1 --recheck HL with AM labs --Daily CBC per protocol while on IV heparin  Samuel Chavez A  Samuel Chavez 02/19/2022,9:54 PM

## 2022-02-19 NOTE — Consult Note (Signed)
ANTICOAGULATION CONSULT NOTE  Pharmacy Consult for IV Heparin Indication: pulmonary embolus (rule-out / work-up pending)  Patient Measurements: Height: 5' 9.02" (175.3 cm) Weight: (!) 197 kg (434 lb 4.9 oz) IBW/kg (Calculated) : 70.74 Heparin Dosing Weight: 121 kg  Labs: Recent Labs    02/17/22 0305 02/17/22 1651 02/18/22 0326 02/18/22 1331 02/19/22 0412 02/19/22 0920  HGB 9.5*  --  10.8*  --  10.1*  --   HCT 34.7*  --  39.8  --  37.4*  --   PLT 134*  --  172  --  181  --   HEPARINUNFRC 0.40  --   --   --   --   --   HEPRLOWMOCWT  --  0.67  --   --   --  0.27  CREATININE 0.84  --  1.88* 1.64* 2.61*  --   CKTOTAL  --   --   --   --  663*  --     Estimated Creatinine Clearance: 61.9 mL/min (A) (by C-G formula based on SCr of 2.61 mg/dL (H)).   Medical History: Past Medical History:  Diagnosis Date   Diabetes mellitus without complication ([place])    Hypertension    Pancreatitis     Medications:  --No anticoagulation prior to admission per my chart review --Patient was on therapeutic IV heparin from 7/29 thru 7/31  --Patient was on therapeutic Lovenox from 7/31 thru 8/2  Assessment: Patient is a 46 y/o M with medical history including DM, HTN, pancreatitis, tobacco use disorder, systolic CHF, OSA, morbid obesity who is admitted with severe respiratory failure requiring mechanical ventilatory support. There is concern for pulmonary embolism. Work-up is still pending. Pharmacy consulted to re-initiate IV heparin from therapeutic Lovenox partly secondary to AKI / concern for reduced clearance.   Patient was previously on IV heparin this admission and was therapeutic at 2000 units/hr  Goal of Therapy:  Heparin level 0.3-0.7 units/ml Monitor platelets by anticoagulation protocol: Yes   Plan:  --Start IV heparin at 2000 units/hr, no bolus given residual Lovenox --HL 6 hours after initiation of infusion --Daily CBC per protocol while on IV heparin  Benita Gutter 02/19/2022,3:02 PM

## 2022-02-19 NOTE — Progress Notes (Addendum)
NAME:  Samuel Chavez, MRN:  [id number], DOB:  Nov 03, 1975, LOS: 8 ADMISSION DATE:  02/11/2022, CONSULTATION DATE: 02/11/22 REFERRING MD:  Valente David MD  CHIEF COMPLAINT: SOB   CC  follow up RESP FAILURE/acute CHF exacerbation  HPI/SYNOPSIS  46 y.o male with significant PMH as below who presented to the ED with chief complaints of SOB and worsening bilateral lower extremities edema.  ED Course: In the emergency department, the temperature was 37.3C, the heart rate 99 beats/minute, the blood pressure 187/102 mm Hg, the respiratory rate 20 breaths/minute, and the oxygen saturation 89% on. He was noted to be drowsy but still protecting his airway and responding appropriately.  Pertinent INITIAL Labs/Diagnostics Findings: Chemistry:Glucose:319 Calcium: 8.8 otherwise unremarkable CBC: unremarkable Other Lab findings: ABG with hypoxemia and hypercapnia, .  Imaging: Chest x-ray showed bilateral infiltrates worse on right  -02/17/22- patient is severely critically ill with bilateral multifocal pneumonia on sedation and paralysis with mechanical ventilation maximal settings.  He is at cusp of death, we met with daughter today and discussed severity of illness. Unable to perform CT due to severity of critical illness. Met with previous PCCM doc and discussed medical plan.  Patient had recruitment maneuvers last few days without improvement, on heparin gtt for possible PE.   02/18/22- patient continues to require maximal settings on ventilator despite good UOP.  He destaturated to <50% spO2 on MV today required BGV.   02/19/22- patient continues to require 100% FiO2 on maximal setting with high PEEP ladder for possible ARDS.  He was diuresed and on steroids with development of AKI.  His CXR had slight interval improvement on right. We discussed case with vascular surgery regarding possible empiric tPA for PE.   Significant Hospital Events   7/25: Admitted to hospitalist service w/acute on chronic hypoxic  hypercapnic resp. failure requiring BiPAP.Failed BiPAP and intubated. PCCM consulted 7/26: INTUBATED, difficlut to sedate, started KETAMINE INFUSION 7/27: severe hypoxia, PEEP increased to 15 but decreased back to 10 7/28: Persistent severe hypoxia, ventilator changes made 7/29: Persistent hypoxia, recruitment maneuvers instituted, ventilator changes made, empiric heparin as patient cannot be scanned query PE  Consults:  PCCM  Procedures:  7/25: Intubation 7/26: PICC triple-lumen, right basilic vein  Significant Diagnostic Tests:  7/25  Chest Xray:Chest x-ray showed cardiomegaly and pulmonary vascular congestion without overt pulmonary edema 7/25 Echocardiogram: difficult study, LVEF was estimated at 55 to 60%, dilated LV moderate LVH, grade 1 DD, enlargement of the right ventricle, left atrial size moderately dilated, right atrial size moderately dilated this is consistent with restrictive physiology (echo reading not congruous with prior echoes from [facility]) 7/30 Limited echo: LVEF 40 to 45% decreased LV function, no wall motion abnormalities, moderate dilation of the LV concentric LVH, right ventricular size moderately enlarged, right atrial enlargement, this is more consistent with prior [facility] scans  Micro Data:  7/25: SARS-CoV-2 PCR> negative 7/25: MRSA PCR>> NEG 7/27: Strep pneumoniae Ag>> negative 7/27: Legionella Ag>> 7/27: Mycoplasma>> 7/27: Sputum>> Strep pneumo,Haemophilus influenzae 7/30 Sputum cult >>  Antimicrobials:  7/27: Maxipime >> 7/27: Vancomycin >> 7/29 7/30: Vancomycin (resumed due to fever spike on Maxipime) ? Resistant Strep pneumo   REVIEW OF SYSTEMS Patient is unable to provide complete review of systems due to severe critical illness/ventilator dependence  OBJECTIVE  Blood pressure 113/68, pulse 98, temperature 99.7 F (37.6 C), temperature source Esophageal, resp. rate (!) 30, height 5' 9.02" (1.753 m), weight (!) 197 kg,  SpO2 98 %. CVP:  [9 mmHg-17  mmHg] 12 mmHg   Vent Mode: PRVC FiO2 (%):  [100 %] 100 % Set Rate:  [30 bmp] 30 bmp Vt Set:  [500 mL] 500 mL PEEP:  [18 cmH20] 18 cmH20 Plateau Pressure:  [36 cmH20] 36 cmH20   Intake/Output Summary (Last 24 hours) at 02/19/2022 1032 Last data filed at 02/19/2022 1000 Gross per 24 hour  Intake 5044.31 ml  Output 2125 ml  Net 2919.31 ml    Filed Weights   02/17/22 0410 02/18/22 0454 02/19/22 0400  Weight: (!) 196 kg (!) 195 kg (!) 197 kg   PHYSICAL EXAMINATION: GENERAL: Morbidly obese man, intubated, mechanically ventilated, sedated.  Malodorous mouth secretions. HEAD: Normocephalic, atraumatic.  EYES: Pupils equal, round, reactive to light.  No scleral icterus.  MOUTH: Macroglossia (massive).  Orotracheally intubated, OG in place. NECK: Supple. No thyromegaly. Trachea midline. No JVD.  No adenopathy. PULMONARY: Good air entry bilaterally.  Occasional rhonchi noted. CARDIOVASCULAR: S1 and S2.  Sinus tach.  ABDOMEN: Obese, soft, nondistended, tolerating tube feeds. MUSCULOSKELETAL: No joint deformity, no clubbing, no edema.  SCDs in place. NEUROLOGIC: Sedated and paralyzed on ventilator GCS 4T SKIN: Intact,warm,dry.  Chronic stasis changes in the lower extremities. PSYCH: Not assessed due to mechanically ventilated status.  Labs/imaging that I havepersonally reviewed    Labs   CBC: Recent Labs  Lab 02/15/22 0352 02/16/22 0301 02/17/22 0305 02/18/22 0326 02/19/22 0412  WBC 14.6* 12.1* 14.6* 15.3* 12.3*  NEUTROABS  --  8.7* 11.6* 11.9* 10.3*  HGB 11.1* 9.7* 9.5* 10.8* 10.1*  HCT 38.0* 34.6* 34.7* 39.8 37.4*  MCV 90.9 93.8 98.6 96.8 96.6  PLT 180 154 134* 172 181     Basic Metabolic Panel: Recent Labs  Lab 02/15/22 0352 02/16/22 0301 02/17/22 0305 02/18/22 0326 02/18/22 1331 02/19/22 0412  NA 145 145 145 151* 151* 151*  K 4.0 3.5 3.9 5.0 5.3* 5.7*  CL 104 112* 112* 111 114* 113*  CO2 36* 31 27 32 31 30  GLUCOSE 174* 95 181* 177*  222* 246*  BUN 46* 38* 36* 72* 72* 94*  CREATININE 1.16 0.90 0.84 1.88* 1.64* 2.61*  CALCIUM 8.3* 7.7* 6.8* 8.3* 8.4* 8.2*  MG 2.4 2.1 2.1 2.7*  --  2.9*  PHOS 3.9 2.4* 4.6 4.1  --  3.4    GFR: Estimated Creatinine Clearance: 61.9 mL/min (A) (by C-G formula based on SCr of 2.61 mg/dL (H)). Recent Labs  Lab 02/13/22 0411 02/13/22 1146 02/13/22 1625 02/14/22 0445 02/16/22 0301 02/17/22 0305 02/18/22 0326 02/19/22 0412 02/19/22 0920  PROCALCITON <0.10  --   --   --   --   --   --   --  8.62  WBC 16.3*  --   --    < > 12.1* 14.6* 15.3* 12.3*  --   LATICACIDVEN  --  2.4* 2.6*  --   --   --   --   --   --    < > = values in this interval not displayed.     Liver Function Tests: No results for input(s): "AST", "ALT", "ALKPHOS", "BILITOT", "PROT", "ALBUMIN" in the last 168 hours.  ABG    Component Value Date/Time   PHART 7.28 (L) 02/19/2022 0500   PCO2ART 73 (HH) 02/19/2022 0500   PO2ART 64 (L) 02/19/2022 0500   HCO3 34.3 (H) 02/19/2022 0500   TCO2 26 03/17/2007 1822   O2SAT 92 02/19/2022 0500     Home Medications  Prior to Admission medications   Medication Sig Start  Date End Date Taking? Authorizing Provider  losartan (COZAAR) 100 MG tablet Take 100 mg by mouth daily. 02/01/22  Yes [provider]  OZEMPIC, 0.25 OR 0.5 MG/DOSE, 2 MG/3ML SOPN SMARTSIG:0.25 Milligram(s) SUB-Q Once a Week 12/09/21  Yes [provider]  torsemide (DEMADEX) 20 MG tablet Take 20 mg by mouth daily. 12/18/21  Yes [provider]  Aspirin-Salicylamide-Caffeine (BC HEADACHE POWDER PO) Take 1 packet by mouth as needed (for pain).    [provider]  cephALEXin (KEFLEX) 500 MG capsule Take 1 capsule (500 mg total) by mouth 4 (four) times daily. Patient not taking: Reported on 02/11/2022 12/16/18   Davonna Belling, MD  cyclobenzaprine (FLEXERIL) 10 MG tablet Take 0.5-1 tablets (5-10 mg total) by mouth 2 (two) times daily as needed for muscle spasms. Patient not  taking: Reported on 02/11/2022 03/12/18   Margarita Mail, PA-C  gabapentin (NEURONTIN) 600 MG tablet Take 600 mg by mouth at bedtime. Patient not taking: Reported on 02/11/2022 10/13/21   [provider]  LABETALOL HCL PO Take by mouth. Patient not taking: Reported on 02/11/2022    [provider]  meloxicam (MOBIC) 15 MG tablet Take 1 tablet (15 mg total) by mouth daily. Take 1 daily with food. Patient not taking: Reported on 02/11/2022 03/12/18   Margarita Mail, PA-C  metFORMIN (GLUCOPHAGE) 500 MG tablet Take 500 mg by mouth 2 (two) times daily with a meal. Patient not taking: Reported on 02/11/2022    [provider]  METFORMIN HCL ER, MOD, PO Take by mouth. Patient not taking: Reported on 02/11/2022    [provider]  METOPROLOL SUCCINATE ER PO Take by mouth. Patient not taking: Reported on 02/11/2022    [provider]  oxyCODONE-acetaminophen (PERCOCET) 10-325 MG tablet Take 1 tablet by mouth See admin instructions. Take 1 tablet by mouth 4-5 times a day as needed for pain Patient not taking: Reported on 02/11/2022    [provider]  oxyCODONE-acetaminophen (PERCOCET/ROXICET) 5-325 MG tablet Take 1 tablet by mouth every 8 (eight) hours as needed for severe pain. Patient not taking: Reported on 02/11/2022 12/16/18   Davonna Belling, MD  predniSONE (DELTASONE) 20 MG tablet Take 2 tablets (40 mg total) by mouth daily. Patient not taking: Reported on 02/11/2022 04/04/15   Davonna Belling, MD   Hospital Scheduled Meds:  artificial tears  1 Application Both Eyes L8X   budesonide (PULMICORT) nebulizer solution  0.5 mg Nebulization BID   Chlorhexidine Gluconate Cloth  6 each Topical Q0600   enoxaparin (LOVENOX) injection  1 mg/kg Subcutaneous Q12H   feeding supplement (PROSource TF)  90 mL Per Tube 5 X Daily   free water  100 mL Per Tube Q4H   insulin aspart  0-20 Units Subcutaneous Q4H   insulin aspart  8 Units Subcutaneous Q4H   insulin  glargine-yfgn  26 Units Subcutaneous Daily   insulin starter kit- pen needles  1 kit Other Once   ipratropium-albuterol  3 mL Nebulization Q6H   lactulose  20 g Per Tube BID   living well with diabetes book   Does not apply Once   methylPREDNISolone (SOLU-MEDROL) injection  40 mg Intravenous BID   mouth rinse  15 mL Mouth Rinse Q2H   oxyCODONE  10 mg Per Tube Q6H   pantoprazole sodium  40 mg Per Tube QHS   phenobarbital  97.2 mg Per Tube TID   polyethylene glycol  17 g Per Tube Daily   senna-docusate  1 tablet Per Tube  BID   sodium chloride flush  10-40 mL Intracatheter Q12H   sodium chloride flush  3 mL Intravenous Q12H   Continuous Infusions:  sodium chloride 250 mL (02/19/22 1011)   sodium chloride Stopped (02/11/22 2011)   cefTRIAXone (ROCEPHIN)  IV 2 g (02/19/22 1013)   feeding supplement (VITAL 1.5 CAL) 45 mL/hr at 02/19/22 1000   fentaNYL infusion INTRAVENOUS 300 mcg/hr (02/19/22 1000)   ketamine (KETALAR) 2,500 mg in sodium chloride 0.9 % 500 mL (5 mg/mL) infusion 2.9 mg/kg/hr (02/19/22 1000)   midazolam 10 mg/hr (02/19/22 1032)   PRN Meds:.sodium chloride, acetaminophen **OR** acetaminophen, cyclobenzaprine, docusate sodium, fentaNYL, midazolam, ondansetron **OR** ondansetron (ZOFRAN) IV, mouth rinse, polyethylene glycol, sodium chloride flush, sodium chloride flush, traZODone, vecuronium      Active Hospital Problem list    Patient Active Problem List   Diagnosis Date Noted   Acute respiratory failure (HCC) 02/11/2022   Respiratory failure (HCC) 02/11/2022   Acute respiratory failure with hypoxia and hypercarbia (HCC) 02/11/2022   Acute on chronic systolic CHF (congestive heart failure) (HCC) 02/11/2022   Hypertensive urgency 02/11/2022   Type 2 diabetes mellitus with peripheral neuropathy (HCC) 02/11/2022   OBSTRUCTIVE SLEEP APNEA 04/30/2009    Assessment & Plan:   46 yo morbidly obese AAM with severe decompensated acute systolic and diastolic heart failure  with previously documented severe OSA/OHS, noncompliant with CPAP, active smoker, leading to severe hypoxic resp failure with encephalopathy and difficulty with sedation  Severe acute on chronic hypoxic and hypercapnic respiratory failure due to CAP and acute decompensation of chronic heart failure Persistent hypoxemia despite trial of several ventilator strategies continue Mechanical Ventilator support Wean Fio2 and PEEP as tolerated Continue VAP/VENT bundles  Wean PEEP & FiO2 as tolerated, maintain SpO2 > 88% Head of bed elevated 30 degrees, VAP protocol in place Plateau pressures less than 30 cm H20 as able Intermittent chest x-ray & ABG PRN Ensure adequate pulmonary hygiene  will NOT perform SAT/SBT due to high FiO2 requirements, asynchrony off sedation  Requiring paralytic infusion for ventilator synchrony Recruitment maneuvers every 4h and as needed. On heparin infusion as cannot exclude PE, however no DVT on LE Dopplers assuring, Patient cannot be proned, due to weight limit for safe proning and unstable airway (ET tube with constant need of repositioning) Vascular surgery consult - possible PE , we are considering tPA - appreciate recommendations   Community-acquired pneumonia: Streptococcus pneumoniae and Haemophilus influenzae Fever spike today Sputum re-cultured Changed antibiotics to ceftaz today -  Acute on chronic systolic and diastolic heart failure LVEF 40 to 45% consistent with prior studies performed at [facility] Continue ventilator support Lasix twice daily IV Trial of milrinone Creatinine 0.9 today Diamox 500 mg x 1 Hold metoprolol and Losartan due to sedation related hypotension 2D Echocardiogram shows diastolic dysfunction and enlarged left and right atria consistent with restrictive physiology  ACUTE KIDNEY INJURY/Renal Failure -continues to improve Element of cardiorenal syndrome -continue Foley catheter-assess need -Avoid nephrotoxic  agents -Follow urine output, BMP -Ensure adequate renal perfusion, optimize oxygenation -Renal dose medications -Nephrology has signed off -On Lasix twice daily -Added milrinone   Intake/Output Summary (Last 24 hours) at 02/19/2022 1032 Last data filed at 02/19/2022 1000 Gross per 24 hour  Intake 5044.31 ml  Output 2125 ml  Net 2919.31 ml        Latest Ref Rng & Units 02/19/2022    4:12 AM 02/18/2022    1:31 PM 02/18/2022    3:26 AM  BMP  Glucose 70 - 99 mg/dL 246  222  177   BUN 6 - 20 mg/dL 94  72  72   Creatinine 0.61 - 1.24 mg/dL 2.61  1.64  1.88   Sodium 135 - 145 mmol/L 151  151  151   Potassium 3.5 - 5.1 mmol/L 5.7  5.3  5.0   Chloride 98 - 111 mmol/L 113  114  111   CO2 22 - 32 mmol/L 30  31  32   Calcium 8.9 - 10.3 mg/dL 8.2  8.4  8.3     ENDO - ICU hypoglycemic\Hyperglycemia protocol -check FSBS per protocol -Diabetes mellitus -HgbA1c  10.1 -CBG's AC & hs; Target range of 140 to 180 -SSI -Follow ICU Hypo/Hyperglycemia protocol -Hold Metformin  GI GI PROPHYLAXIS PPI  NUTRITIONAL STATUS DIET-->TF's as tolerated Constipation protocol as indicated  ACUTE ANEMIA- Transfuse as needed Consider transfusion  if hgb<7  Best practice:  Diet: Tube feeds Pain/Anxiety/Delirium protocol (if indicated): Yes (RASS goal 0) VAP protocol (if indicated): Yes DVT prophylaxis: Systemic AC GI prophylaxis: PPI Glucose control:  SSI Yes Central venous access:  N/A Arterial line:  N/A Foley:  Yes, and it is still needed Mobility:  bed rest  PT consulted: N/A Last date of multidisciplinary goals of care discussion [7/25] Code Status:  full code Disposition: ICU   Critical care provider statement:   Total critical care time: 33 minutes   Performed by: Lanney Gins MD   Critical care time was exclusive of separately billable procedures and treating other patients.   Critical care was necessary to treat or prevent imminent or life-threatening deterioration.   Critical  care was time spent personally by me on the following activities: development of treatment plan with patient and/or surrogate as well as nursing, discussions with consultants, evaluation of patient's response to treatment, examination of patient, obtaining history from patient or surrogate, ordering and performing treatments and interventions, ordering and review of laboratory studies, ordering and review of radiographic studies, pulse oximetry and re-evaluation of patient's condition.    Ottie Glazier, M.D.  Pulmonary & Critical Care Medicine

## 2022-02-19 NOTE — Progress Notes (Signed)
Nutrition Follow Up Note   DOCUMENTATION CODES:   Morbid obesity  INTERVENTION:   Continue Vital 1.5 @45ml/hr + ProSource TF 90ml five times daily via tube  Free water flushes 100ml q4 hours   Regimen provides 2020kcal/day, 183g/day protein and 1425ml/day of free water   NUTRITION DIAGNOSIS:   Inadequate oral intake related to inability to eat (pt sedated and ventilated) as evidenced by NPO status.  GOAL:   Provide needs based on ASPEN/SCCM guidelines -met   MONITOR:   Vent status, Labs, Weight trends, TF tolerance, Skin, I & O's  ASSESSMENT:   46 y/o male with h/o COPD, CHF, HTN, DM and OSA who is admitted with CHF exacerbation, CAP and severe ARDS.  Pt remains sedated and ventilated. OGT in place. Pt tolerating tube feeds at goal rate. Pt with new AKI, hypernatremia and hyperkalemia today. Nephrology is following; no plans for HD at this time. Pt has not had BM since admission; suppository given yesterday and lactulose added today. Per chart, pt is up ~42lbs from his UBW; pt -3.1L on his I & Os.   Medications reviewed and include: lovenox, insulin, lactulose, solu-medrol, oxycodone, protonix, miralax, senokot, ceftriaxone  Labs reviewed: Na 151(H), K 5.5(H), BUN 94(H), creat 2.61(H), P 3.4 wnl, Mg 2.9(H) Wbc- 12.3(H), Hgb 10.1(L), Hct 37.4(L) Cbgs- 212, 173, 219, 194 x 24 hrs  Patient is currently intubated on ventilator support MV: 14.8 L/min Temp (24hrs), Avg:100.8 F (38.2 C), Min:97.7 F (36.5 C), Max:101.7 F (38.7 C)  Propofol: none   MAP- >65mmHg   UOP- 2645ml   Diet Order:   Diet Order             Diet NPO time specified  Diet effective now                  EDUCATION NEEDS:   Not appropriate for education at this time  Skin:  Skin Assessment: Reviewed RN Assessment  Last BM:  pta  Height:   Ht Readings from Last 1 Encounters:  02/13/22 5' 9.02" (1.753 m)    Weight:   Wt Readings from Last 1 Encounters:  02/19/22 (!) 197 kg     Ideal Body Weight:  72.7 kg  BMI:  Body mass index is 64.11 kg/m.  Estimated Nutritional Needs:   Kcal:  2100-2400kcal/day  Protein:  182g/day protein  Fluid:  2.0L/day  Casey Campbell MS, RD, LDN Please refer to AMION for RD and/or RD on-call/weekend/after hours pager 

## 2022-02-19 NOTE — Progress Notes (Addendum)
Recruitment Maneuver performed Peep 40 Rate 5 100% x 40 seconds. Saturation dropped to 70%

## 2022-02-19 NOTE — Progress Notes (Signed)
Recruitment Maneuver completed PC 0/40 100% x 45 seconds, saturation dropped from 98% to 93%

## 2022-02-19 NOTE — Progress Notes (Signed)
Recruitment Maneuver Completed   PC 0/40 100% for 30 sec x 3  Sat initially low 90's with sat mid to upper 90's upon completion

## 2022-02-19 NOTE — Progress Notes (Addendum)
Recruitment Maneuver Completed  PC 0/40 100% for 30 sec x 3  Sats initially dropped to mid 80's with slight improvement overall to 90%

## 2022-02-19 NOTE — Progress Notes (Signed)
  Daily Progress Note   Patient Name: Samuel Chavez       Date: 02/19/2022 DOB: [birth year]  Age: 46 y.o. MRN#: [id number] Attending Physician: Ottie Glazier, MD Primary Care Physician: [facility] Admit Date: 02/11/2022 Length of Stay: 8 days  Reason for Consultation/Follow-up: {Reason for Consult:23484}  HPI/Patient Profile:  ***  Subjective:   Subjective: Chart Reviewed. Updates received. Patient Assessed. Created space and opportunity for patient  and family to explore thoughts and feelings regarding current medical situation.  Today's Discussion: ***  Review of Systems  Objective:   Vital Signs:  BP 113/68   Pulse 98   Temp 99.9 F (37.7 C) (Esophageal)   Resp (!) 30   Ht 5' 9.02" (1.753 m)   Wt (!) 197 kg   SpO2 100%   BMI 64.11 kg/m   Physical Exam: Physical Exam  Palliative Assessment/Data: ***   Assessment & Plan:   Impression: Present on Admission:  Respiratory failure ([place])  ***  SUMMARY OF RECOMMENDATIONS   ***  Symptom Management:  ***  Code Status: {Palliative Code status:23503}  Prognosis: {Palliative Care Prognosis:23504}  Discharge Planning: {Palliative dispostion:23505}  Discussed with: ***  Thank you for allowing [place] to participate in the care of Samuel Chavez PMT will continue to support holistically.  Time Total: ***  Visit consisted of counseling and education dealing with the complex and emotionally intense issues of symptom management and palliative care in the setting of serious and potentially life-threatening illness. Greater than 50%  of this time was spent counseling and coordinating care related to the above assessment and plan.  Walden Field, NP Palliative Medicine Team  Team Phone # [phone] (Nights/Weekends)  03/19/2021, 8:17 AM

## 2022-02-19 NOTE — Consult Note (Signed)
[facility] Consult Note  MRN : [id number]  Samuel Chavez is a 46 y.o. (1975/10/10) male who presents with chief complaint of  Chief Complaint  Patient presents with   Leg Swelling  .   Consulting Physician: Dianah Field, MD Reason for consult: Concern for pulmonary embolism History of Present Illness: The patient is a 46 year old male with a previous history significant of hypertension, HFrEF, and diabetes mellitus type 2 who presented to [facility] with hypoxic respiratory failure.  Initially the patient was started on BiPAP and he ultimately required intubation.  Despite this he still continues to have significant persistent hypoxia.  The patient's respiratory status is very tenuous as small shows movement, significant desaturation.  Bilateral lower extremity Dopplers were done and no DVT was found.  Due to the patient's tenuous respiratory status, he is unable to undergo a VQ scan or CT scan to evaluate for possible pulmonary embolism.  Any movement at all places the patient at great risk.  The patient has been on heparin for several days which has been therapeutic in nature.  Current Facility-Administered Medications  Medication Dose Route Frequency Provider Last Rate Last Admin   0.9 %  sodium chloride infusion  250 mL Intravenous PRN Flora Lipps, MD 10 mL/hr at 02/19/22 1011 250 mL at 02/19/22 1011   0.9 %  sodium chloride infusion  250 mL Intravenous Continuous Lang Snow, NP   Held at 02/11/22 2011   acetaminophen (TYLENOL) tablet 650 mg  650 mg Oral Q6H PRN Mansy, Jan A, MD   650 mg at 02/19/22 8185   Or   acetaminophen (TYLENOL) suppository 650 mg  650 mg Rectal Q6H PRN Mansy, Jan A, MD       artificial tears (LACRILUBE) ophthalmic ointment 1 Application  1 Application Both Eyes U3J Rust-Chester, Britton L, NP   1 Application at 49/70/26 0522   budesonide (PULMICORT) nebulizer solution 0.5 mg  0.5 mg  Nebulization BID Flora Lipps, MD   0.5 mg at 02/19/22 0802   cefTRIAXone (ROCEPHIN) 2 g in sodium chloride 0.9 % 100 mL IVPB  2 g Intravenous Q12H Aleskerov, Fuad, MD 200 mL/hr at 02/19/22 1013 2 g at 02/19/22 1013   Chlorhexidine Gluconate Cloth 2 % PADS 6 each  6 each Topical [id number] Flora Lipps, MD   6 each at 02/19/22 0523   cyclobenzaprine (FLEXERIL) tablet 5-10 mg  5-10 mg Oral BID PRN Mansy, Jan A, MD       docusate sodium (COLACE) capsule 100 mg  100 mg Oral BID PRN Flora Lipps, MD       enoxaparin (LOVENOX) 100 mg/mL injection 195 mg  1 mg/kg Subcutaneous Q12H Aleskerov, Fuad, MD   195 mg at 02/17/22 2341   feeding supplement (PROSource TF) liquid 90 mL  90 mL Per Tube 5 X Daily Rust-Chester, Britton L, NP   90 mL at 02/19/22 0834   feeding supplement (VITAL 1.5 CAL) liquid 1,000 mL  1,000 mL Per Tube Continuous Tyler Pita, MD 45 mL/hr at 02/19/22 1000 Infusion Verify at 02/19/22 1000   fentaNYL (SUBLIMAZE) bolus via infusion 50-100 mcg  50-100 mcg Intravenous Q15 min PRN Flora Lipps, MD   100 mcg at 02/15/22 0350   fentaNYL 2541mg in NS 2579m(1041mml) infusion-PREMIX  50-300 mcg/hr Intravenous Continuous Rust-Chester, Britton L, NP 30 mL/hr at 02/19/22 1000 300 mcg/hr at 02/19/22 1000   free water 100 mL  100 mL Per Tube Q4H Rust-Chester,  Huel Cote, NP   100 mL at 02/19/22 0834   insulin aspart (novoLOG) injection 0-20 Units  0-20 Units Subcutaneous Q4H Lang Snow, NP   4 Units at 02/19/22 1540   insulin aspart (novoLOG) injection 8 Units  8 Units Subcutaneous Q4H Rust-Chester, Britton L, NP   8 Units at 02/19/22 0833   insulin glargine-yfgn (SEMGLEE) injection 26 Units  26 Units Subcutaneous Daily Flora Lipps, MD   26 Units at 02/19/22 1019   insulin starter kit- pen needles (English) 1 kit  1 kit Other Once Flora Lipps, MD       ipratropium-albuterol (DUONEB) 0.5-2.5 (3) MG/3ML nebulizer solution 3 mL  3 mL Nebulization Q6H Flora Lipps, MD   3 mL at 02/19/22  0802   ketamine (KETALAR) 2,500 mg in sodium chloride 0.9 % 500 mL (5 mg/mL) infusion  0.5-3 mg/kg/hr (Adjusted) Intravenous Continuous Vira Blanco, RPH 64.3 mL/hr at 02/19/22 1042 2.9 mg/kg/hr at 02/19/22 1042   lactulose (CHRONULAC) 10 GM/15ML solution 20 g  20 g Per Tube BID Ottie Glazier, MD   20 g at 02/19/22 1018   living well with diabetes book MISC   Does not apply Once Flora Lipps, MD       methylPREDNISolone sodium succinate (SOLU-MEDROL) 40 mg/mL injection 40 mg  40 mg Intravenous BID Ottie Glazier, MD       midazolam (VERSED) 100 mg/100 mL (1 mg/mL) premix infusion  2-10 mg/hr Intravenous Continuous Rust-Chester, Britton L, NP 10 mL/hr at 02/19/22 1032 10 mg/hr at 02/19/22 1032   midazolam (VERSED) bolus via infusion 2-4 mg  2-4 mg Intravenous Q2H PRN Rust-Chester, Huel Cote, NP   4 mg at 02/16/22 0418   ondansetron (ZOFRAN) tablet 4 mg  4 mg Oral Q6H PRN Mansy, Jan A, MD       Or   ondansetron [facility]) injection 4 mg  4 mg Intravenous Q6H PRN Mansy, Jan A, MD       Oral care mouth rinse  15 mL Mouth Rinse Q2H Kasa, Kurian, MD   15 mL at 02/19/22 1019   Oral care mouth rinse  15 mL Mouth Rinse PRN Flora Lipps, MD       oxyCODONE (Oxy IR/ROXICODONE) immediate release tablet 10 mg  10 mg Per Tube Q6H Flora Lipps, MD   10 mg at 02/19/22 0834   pantoprazole sodium (PROTONIX) 40 mg/20 mL oral suspension 40 mg  40 mg Per Tube QHS Lockie Mola B, RPH   40 mg at 02/18/22 2126   PHENobarbital (LUMINAL) tablet 97.2 mg  97.2 mg Per Tube TID Ottie Glazier, MD   97.2 mg at 02/19/22 1018   polyethylene glycol (MIRALAX / GLYCOLAX) packet 17 g  17 g Per Tube Daily Delena Bali, RPH   17 g at 02/19/22 1018   polyethylene glycol (MIRALAX / GLYCOLAX) packet 17 g  17 g Per Tube Daily PRN Delena Bali, RPH       senna-docusate (Senokot-S) tablet 1 tablet  1 tablet Per Tube BID Teressa Lower, NP   1 tablet at 02/19/22 1018   sodium chloride flush (NS) 0.9 % injection 10-40 mL   10-40 mL Intracatheter Q12H Kasa, Kurian, MD   20 mL at 02/19/22 1014   sodium chloride flush (NS) 0.9 % injection 10-40 mL  10-40 mL Intracatheter PRN Flora Lipps, MD   40 mL at 02/15/22 0907   sodium chloride flush (NS) 0.9 % injection 3 mL  3 mL Intravenous Q12H  Flora Lipps, MD   3 mL at 02/19/22 1015   sodium chloride flush (NS) 0.9 % injection 3 mL  3 mL Intravenous PRN Flora Lipps, MD       traZODone (DESYREL) tablet 25 mg  25 mg Per NG tube QHS PRN Darrick Penna, [facility]        Past Medical History:  Diagnosis Date   Diabetes mellitus without complication ([place])    Hypertension    Pancreatitis     Past Surgical History:  Procedure Laterality Date   CHOLECYSTECTOMY      Social History Social History   Tobacco Use   Smoking status: Every Day    Packs/day: 1.00    Types: Cigarettes   Smokeless tobacco: Never  Substance Use Topics   Alcohol use: No   Drug use: No    Family History History reviewed. No pertinent family history.  Allergies  Allergen Reactions   Hydrocodone Swelling     REVIEW OF SYSTEMS (Negative unless checked)  Patient unable to provide complete review of systems due to severe critical illness and required ventilator dependence.  Physical Examination  Vitals:   02/19/22 0802 02/19/22 0900 02/19/22 0950 02/19/22 1000  BP:  115/64  113/68  Pulse: (!) 102 (!) 103 99 98  Resp: (!) 30 (!) 30 (!) 30 (!) 30  Temp:  100.2 F (37.9 C) 100 F (37.8 C) 99.7 F (37.6 C)  TempSrc:  Esophageal Esophageal Esophageal  SpO2: (!) 87% 93% 100% 98%  Weight:      Height:       Body mass index is 64.11 kg/m.  Neurologic: Sedated    CBC Lab Results  Component Value Date   WBC 12.3 (H) 02/19/2022   HGB 10.1 (L) 02/19/2022   HCT 37.4 (L) 02/19/2022   MCV 96.6 02/19/2022   PLT 181 02/19/2022    BMET    Component Value Date/Time   NA 151 (H) 02/19/2022 0412   K 5.7 (H) 02/19/2022 0412   CL 113 (H) 02/19/2022 0412   CO2 30 02/19/2022 0412    GLUCOSE 246 (H) 02/19/2022 0412   BUN 94 (H) 02/19/2022 0412   CREATININE 2.61 (H) 02/19/2022 0412   CALCIUM 8.2 (L) 02/19/2022 0412   GFRNONAA 30 (L) 02/19/2022 0412   GFRAA >60 12/16/2018 1601   Estimated Creatinine Clearance: 61.9 mL/min (A) (by C-G formula based on SCr of 2.61 mg/dL (H)).  COAG No results found for: "INR", "PROTIME"  Radiology DG Chest Port 1 View  Result Date: 02/19/2022 CLINICAL DATA:  Pulmonary edema EXAM: PORTABLE CHEST 1 VIEW COMPARISON:  Chest x-ray dated February 17, 2022 FINDINGS: Stable position of ET tube, enteric tube and right arm PICC. Cardiac and mediastinal contours are not visualized. New near complete opacification of the left hemithorax, likely due to lobar collapse. Decreased consolidation of the right upper lobe. No definite pleural effusion pneumothorax. IMPRESSION: 1. New near complete opacification of the left hemithorax, likely due to lobar collapse. 2. Decreased consolidation of the right upper lobe. Electronically Signed   By: Yetta Glassman M.D.   On: 02/19/2022 08:43   DG Abd 1 View  Result Date: 02/18/2022 CLINICAL DATA:  Orogastric tube placement. EXAM: ABDOMEN - 1 VIEW COMPARISON:  02/12/2019 FINDINGS: Moderate to markedly limited exam secondary to patient's size and technique. Multiple overlying wires and leads. A nasogastric tube is followed to the level of the gastric body. IMPRESSION: Nasogastric tube terminates at the gastric body. Otherwise, severely limited exam. Electronically Signed  By: Abigail Miyamoto M.D.   On: 02/18/2022 14:39   DG Chest Port 1 View  Result Date: 02/17/2022 CLINICAL DATA:  Intubation, oxygen saturation changes EXAM: PORTABLE CHEST 1 VIEW COMPARISON:  Portable exam 1059 hours compared to 02/16/2022 FINDINGS: Tip of endotracheal tube projects 11.4 cm above carina. Nasogastric tube extends into stomach. Enlargement of cardiac silhouette. Significantly increased RIGHT upper lobe infiltrate consistent with pneumonia.  Persistent atelectasis or infiltrate in LEFT lung. No pleural effusion or pneumothorax. IMPRESSION: Significantly increased RIGHT upper lobe infiltrate consistent with pneumonia. Persistent atelectasis versus consolidation LEFT lower lobe. Electronically Signed   By: Lavonia Dana M.D.   On: 02/17/2022 11:46   DG Chest Port 1 View  Result Date: 02/16/2022 CLINICAL DATA:  Hypoxia EXAM: PORTABLE CHEST 1 VIEW COMPARISON:  02/15/2022 FINDINGS: Cardiac shadow remains enlarged. Endotracheal tube and gastric catheter are seen in satisfactory position. Right-sided PICC is noted in stable position. The lungs are slightly hypoinflated with increasing airspace opacities bilaterally but particularly on the right when compared with the prior exam. No acute bony abnormality is noted. IMPRESSION: Increasing airspace opacities particularly on the right when compared with the prior exam. Electronically Signed   By: Inez Catalina M.D.   On: 02/16/2022 19:58   ECHOCARDIOGRAM LIMITED  Result Date: 02/16/2022    ECHOCARDIOGRAM LIMITED REPORT   Patient Name:   ALPHONSUS DOYEL Date of Exam: 02/16/2022 Medical Rec #:  [id number]        Height:       69.0 in Accession #:    [id number]       Weight:       438.7 lb Date of Birth:  06-04-1976        BSA:          2.885 m Patient Age:    89 years         BP:           117/94 mmHg Patient Gender: M                HR:           96 bpm. Exam Location:  [facility] Procedure: Limited Echo and Intracardiac Opacification Agent Indications:     Cardiomyopathy  History:         Patient has prior history of Echocardiogram examinations. CHF;                  Risk Factors:Hypertension, Diabetes, Sleep Apnea, Current                  Smoker and 69/438lbs Morbidly Obese.  Sonographer:     L. Thornton-Maynard Referring Phys:  [id number] [place] RUST-CHESTER Diagnosing Phys: Neoma Laming  Sonographer Comments: Image acquisition challenging due to patient body habitus. IMPRESSIONS  1. Left ventricular ejection  fraction, by estimation, is 40 to 45%. The left ventricle has mildly decreased function. The left ventricle has no regional wall motion abnormalities. The left ventricular internal cavity size was moderately dilated. There is mild concentric left ventricular hypertrophy.  2. Right ventricular systolic function is normal. The right ventricular size is moderately enlarged.  3. Left atrial size was moderately dilated.  4. Right atrial size was moderately dilated.  5. The mitral valve is normal in structure. Trivial mitral valve regurgitation. No evidence of mitral stenosis.  6. The aortic valve is normal in structure. Aortic valve regurgitation is not visualized. Aortic valve sclerosis is present, with no evidence of aortic valve stenosis.  7.  The inferior vena cava is normal in size with greater than 50% respiratory variability, suggesting right atrial pressure of 3 mmHg. FINDINGS  Left Ventricle: Left ventricular ejection fraction, by estimation, is 40 to 45%. The left ventricle has mildly decreased function. The left ventricle has no regional wall motion abnormalities. Definity contrast agent was given IV to delineate the left ventricular endocardial borders. The left ventricular internal cavity size was moderately dilated. There is mild concentric left ventricular hypertrophy. Right Ventricle: The right ventricular size is moderately enlarged. No increase in right ventricular wall thickness. Right ventricular systolic function is normal. Left Atrium: Left atrial size was moderately dilated. Right Atrium: Right atrial size was moderately dilated. Pericardium: There is no evidence of pericardial effusion. Mitral Valve: The mitral valve is normal in structure. Trivial mitral valve regurgitation. No evidence of mitral valve stenosis. Tricuspid Valve: The tricuspid valve is normal in structure. Tricuspid valve regurgitation is trivial. No evidence of tricuspid stenosis. Aortic Valve: The aortic valve is normal in  structure. Aortic valve regurgitation is not visualized. Aortic valve sclerosis is present, with no evidence of aortic valve stenosis. Pulmonic Valve: The pulmonic valve was normal in structure. Pulmonic valve regurgitation is not visualized. No evidence of pulmonic stenosis. Aorta: The aortic root is normal in size and structure. Venous: The inferior vena cava is normal in size with greater than 50% respiratory variability, suggesting right atrial pressure of 3 mmHg. IAS/Shunts: No atrial level shunt detected by color flow Doppler. LEFT VENTRICLE PLAX 2D LVIDd:         6.34 cm      Diastology LVIDs:         4.75 cm      LV e' medial:    5.55 cm/s LV PW:         1.61 cm      LV E/e' medial:  13.8 LV IVS:        1.55 cm      LV e' lateral:   8.27 cm/s                             LV E/e' lateral: 9.3  LV Volumes (MOD) LV vol d, MOD A2C: 173.0 ml LV vol d, MOD A4C: 192.0 ml LV vol s, MOD A2C: 103.0 ml LV vol s, MOD A4C: 115.0 ml LV SV MOD A2C:     70.0 ml LV SV MOD A4C:     192.0 ml LV SV MOD BP:      75.6 ml MITRAL VALVE MV Area (PHT): 4.80 cm MV Decel Time: 158 msec MV E velocity: 76.70 cm/s MV A velocity: 65.10 cm/s MV E/A ratio:  1.18 Shaukat Khan Electronically signed by Neoma Laming Signature Date/Time: 02/16/2022/9:58:01 AM    Final    US Venous Img Lower Bilateral (DVT)  Result Date: 02/15/2022 CLINICAL DATA:  Edema Shortness of breath EXAM: BILATERAL LOWER EXTREMITY VENOUS DOPPLER ULTRASOUND TECHNIQUE: Gray-scale sonography with compression, as well as color and duplex ultrasound, were performed to evaluate the deep venous system(s) from the level of the common femoral vein through the popliteal and proximal calf veins. COMPARISON:  None available FINDINGS: VENOUS Normal compressibility of the common femoral, superficial femoral, and popliteal veins, as well as the visualized calf veins. Visualized portions of profunda femoral vein and great saphenous vein unremarkable. No filling defects to suggest DVT on  grayscale or color Doppler imaging. Doppler waveforms show normal direction of venous flow, normal  respiratory plasticity and response to augmentation. OTHER None. Limitations: none IMPRESSION: No lower extremity DVT. Electronically Signed   By: Miachel Roux M.D.   On: 02/15/2022 21:34   DG Chest Port 1 View  Result Date: 02/15/2022 CLINICAL DATA:  Ventilator dependent respiratory failure. EXAM: PORTABLE CHEST 1 VIEW COMPARISON:  02/13/2022. FINDINGS: 5:07 a.m. ETT is 6.4 cm from the carina. Tip of the NGT is in the body of stomach. Right PICC terminates at the superior cavoatrial junction. Moderate cardiomegaly. Central vascular engorgement continues to be seen. Left-greater-than-right pleural effusions are noted with persistent dense opacity at the retrocardiac area consistent with left lower lobe atelectasis, consolidation or combination. There is increased left upper lobe linear atelectasis. Hazy opacity right infrahilar area. Remaining lungs are clear with no other significant opacities. Stable mediastinum. IMPRESSION: 1. Cardiomegaly with continued central vascular prominence. No overt edema findings. 2. Left-greater-than-right lower zonal opacities and pleural effusions. 3. Increased linear atelectasis left upper lobe. Electronically Signed   By: Telford Nab M.D.   On: 02/15/2022 07:23   DG Chest 1 View  Result Date: 02/13/2022 CLINICAL DATA:  Mucous plugging the respiratory tract. EXAM: CHEST  1 VIEW COMPARISON:  February 13, 2022 FINDINGS: An endotracheal tube is seen with its distal tip proximally 7.6 cm from the carina. A nasogastric tube is seen with its distal end extending below the level of the diaphragm. The cardiac silhouette is enlarged and unchanged in size. There is mild to moderate severity prominence of the perihilar pulmonary vasculature. Mild bibasilar airspace disease is seen. There is no evidence of a pleural effusion or pneumothorax. The visualized skeletal structures are  unremarkable. IMPRESSION: 1. Cardiomegaly with mild to moderate severity pulmonary vascular congestion. 2. Mild bibasilar airspace disease which may represent atelectasis and/or infiltrate. Electronically Signed   By: Virgina Norfolk M.D.   On: 02/13/2022 20:03   DG Chest Port 1 View  Result Date: 02/13/2022 CLINICAL DATA:  Acute on chronic respiratory failure. EXAM: PORTABLE CHEST 1 VIEW COMPARISON:  Portable chest yesterday at 7:40 a.m. FINDINGS: 5:15 a.m. ETT tip is 4.5 cm from the carina. NGT is not well seen in the stomach due to body habitus but tip is probably in the body of the stomach. A right PICC has been inserted. The tip is at the level of the superior cavoatrial junction. Moderate to severe cardiomegaly.  Stable mediastinal configuration. There is mild central vascular prominence without overt edema, with small to moderate-sized pleural effusions and opacities in the lower lung fields which could be due to atelectasis or consolidation. There are perihilar atelectatic bands which appears similar. The upper lung fields are otherwise clear. No pneumothorax. IMPRESSION: 1. Support tubes as above, with interval right PICC with tip at the superior cavoatrial junction. 2. Cardiomegaly and mild central vascular fullness without visible edema, small to moderate-sized pleural effusions, and stable overlying opacities in the lung bases. Electronically Signed   By: Telford Nab M.D.   On: 02/13/2022 06:59   ECHOCARDIOGRAM COMPLETE  Result Date: 02/12/2022    ECHOCARDIOGRAM REPORT   Patient Name:   CORNELLIUS KROPP Date of Exam: 02/11/2022 Medical Rec #:  [id number]        Height:       69.0 in Accession #:    [id number]       Weight:       376.8 lb Date of Birth:  [birth year]        BSA:  2.704 m Patient Age:    82 years         BP:           144/78 mmHg Patient Gender: M                HR:           94 bpm. Exam Location:  [facility] Procedure: 2D Echo, Cardiac Doppler, Color Doppler and Intracardiac             Opacification Agent Indications:     K59.93 Acute diastolic CHF  History:         Patient has no prior history of Echocardiogram examinations.                  Risk Factors:Current Smoker, Sleep Apnea, Diabetes and                  Hypertension.  Sonographer:     Cresenciano Lick RDCS Referring Phys:  [id number] Arvella Merles MANSY Diagnosing Phys: Nelva Bush MD IMPRESSIONS  1. Left ventricular ejection fraction, by estimation, is 55 to 60%. The left ventricle has normal function. Left ventricular endocardial border not optimally defined to evaluate regional wall motion. The left ventricular internal cavity size was mildly dilated. There is moderate left ventricular hypertrophy. Left ventricular diastolic parameters are consistent with Grade I diastolic dysfunction (impaired relaxation).  2. Right ventricular systolic function is normal. The right ventricular size is mildly enlarged. Tricuspid regurgitation signal is inadequate for assessing PA pressure.  3. Left atrial size was mild to moderately dilated.  4. Right atrial size was moderately dilated.  5. The mitral valve is abnormal. Trivial mitral valve regurgitation. No evidence of mitral stenosis.  6. The aortic valve has an indeterminant number of cusps. Aortic valve regurgitation is not visualized. No aortic stenosis is present.  7. Aortic dilatation noted. There is borderline dilatation of the aortic root, measuring 39 mm. There is mild dilatation of the ascending aorta, measuring 39 mm. FINDINGS  Left Ventricle: Left ventricular ejection fraction, by estimation, is 55 to 60%. The left ventricle has normal function. Left ventricular endocardial border not optimally defined to evaluate regional wall motion. Definity contrast agent was given IV to delineate the left ventricular endocardial borders. The left ventricular internal cavity size was mildly dilated. There is moderate left ventricular hypertrophy. Left ventricular diastolic parameters are  consistent with Grade I diastolic dysfunction (impaired relaxation). Right Ventricle: The right ventricular size is mildly enlarged. No increase in right ventricular wall thickness. Right ventricular systolic function is normal. Tricuspid regurgitation signal is inadequate for assessing PA pressure. Left Atrium: Left atrial size was mild to moderately dilated. Right Atrium: Right atrial size was moderately dilated. Pericardium: The pericardium was not well visualized. Mitral Valve: The mitral valve is abnormal. There is mild thickening of the mitral valve leaflet(s). Trivial mitral valve regurgitation. No evidence of mitral valve stenosis. Tricuspid Valve: The tricuspid valve is not well visualized. Tricuspid valve regurgitation is not demonstrated. Aortic Valve: The aortic valve has an indeterminant number of cusps. Aortic valve regurgitation is not visualized. No aortic stenosis is present. Pulmonic Valve: The pulmonic valve was not well visualized. Pulmonic valve regurgitation is not visualized. No evidence of pulmonic stenosis. Aorta: Aortic dilatation noted. There is borderline dilatation of the aortic root, measuring 39 mm. There is mild dilatation of the ascending aorta, measuring 39 mm. Pulmonary Artery: The pulmonary artery is not well seen. Venous: The inferior vena cava was not well  visualized. IAS/Shunts: The interatrial septum was not well visualized.  LEFT VENTRICLE PLAX 2D LVIDd:         5.80 cm   Diastology LVIDs:         3.90 cm   LV e' medial:    8.70 cm/s LV PW:         1.40 cm   LV E/e' medial:  8.4 LV IVS:        1.40 cm   LV e' lateral:   7.18 cm/s LVOT diam:     2.40 cm   LV E/e' lateral: 10.2 LV SV:         77 LV SV Index:   29 LVOT Area:     4.52 cm  RIGHT VENTRICLE RV Basal diam:  5.90 cm RV S prime:     13.60 cm/s TAPSE (M-mode): 3.5 cm LEFT ATRIUM              Index        RIGHT ATRIUM           Index LA diam:        5.30 cm  1.96 cm/m   RA Area:     28.40 cm LA Vol (A2C):   116.0 ml  42.90 ml/m  RA Volume:   113.00 ml 41.79 ml/m LA Vol (A4C):   113.0 ml 41.79 ml/m LA Biplane Vol: 117.0 ml 43.27 ml/m  AORTIC VALVE LVOT Vmax:   106.00 cm/s LVOT Vmean:  88.000 cm/s LVOT VTI:    0.170 m  AORTA Ao Root diam: 3.90 cm Ao Asc diam:  3.90 cm MITRAL VALVE MV Area (PHT): 3.37 cm    SHUNTS MV Decel Time: 225 msec    Systemic VTI:  0.17 m MV E velocity: 73.30 cm/s  Systemic Diam: 2.40 cm MV A velocity: 84.00 cm/s MV E/A ratio:  0.87 Harrell Gave End MD Electronically signed by Nelva Bush MD Signature Date/Time: 02/12/2022/11:15:50 AM    Final    [place] EKG SITE RITE  Result Date: 02/12/2022 If Site Rite image not attached, placement could not be confirmed due to current cardiac rhythm.  DG Chest Port 1 View  Result Date: 02/12/2022 CLINICAL DATA:  46 year old male with respiratory failure. EXAM: PORTABLE CHEST 1 VIEW COMPARISON:  Portable chest 02/11/2022 and earlier. FINDINGS: Portable AP semi upright view at 0740 hours. Intubated. Endotracheal tube tip in satisfactory position just below the clavicles. Enteric tube courses to the abdomen, tip not included. Continued somewhat low lung volumes. There is cardiomegaly. Other mediastinal contours are within normal limits. Streaky and confluent bilateral lung base opacity. No pneumothorax. Pulmonary vascularity appears to remain normal. Streaky, more platelike perihilar opacity. No definite pleural effusion. Ventilation not significantly changed. No acute osseous abnormality identified. IMPRESSION: 1.  Stable lines and tubes. 2. Stable ventilation from yesterday. Low lung volumes with cardiomegaly, bilateral lung base collapse or consolidation, and perihilar atelectasis. Electronically Signed   By: Genevie Ann M.D.   On: 02/12/2022 07:49   DG Abd 1 View  Result Date: 02/11/2022 CLINICAL DATA:  Impaired nasogastric feeding tube, subsequent encounter. Endotracheal tube present. EXAM: ABDOMEN - 1 VIEW COMPARISON:  Chest and abdominal radiographs done  earlier the same date. FINDINGS: 2121 hours. Mild motion artifact. Tip of the enteric tube is seen in the left subphrenic area, consistent with position in the proximal to mid stomach. Side hole appears below the gastroesophageal junction. The visualized bowel gas pattern is nonobstructive. Cholecystectomy clips are noted. IMPRESSION: Interval advancement  of the enteric tube to the level of the proximal to mid stomach. Electronically Signed   By: Richardean Sale M.D.   On: 02/11/2022 21:39   DG Chest 1 View  Result Date: 02/11/2022 CLINICAL DATA:  Impaired nasogastric feeding tube, subsequent encounter. Endotracheal tube present. EXAM: CHEST  1 VIEW COMPARISON:  Chest radiographs earlier the same date. Chest CT 06/04/2020. FINDINGS: 2108 hours. Tip of the endotracheal tube projects over the mid trachea. The enteric tube is not well seen within the lower chest or upper abdomen on this view. See separate abdominal examination. Stable cardiomegaly with low lung volumes and probable bibasilar atelectasis. No evidence of pneumothorax or significant pleural effusion. No acute osseous findings are evident. IMPRESSION: 1. Satisfactory position of the endotracheal tube. 2. Inferior aspect of the enteric tube is not well seen on this examination. See separate abdominal radiograph report. 3. Similar low lung volumes with bibasilar atelectasis. Electronically Signed   By: Richardean Sale M.D.   On: 02/11/2022 21:37   DG Abdomen 1 View  Result Date: 02/11/2022 CLINICAL DATA:  OG tube placement EXAM: ABDOMEN - 1 VIEW COMPARISON:  None Available. FINDINGS: Esophageal tube tip overlies the stomach, side-port in the region of distal esophagus. Airspace disease at left base IMPRESSION: Esophageal tube side-port in the region of distal esophagus, suggest further advancement by 5-10 cm for more optimal positioning Electronically Signed   By: Donavan Foil M.D.   On: 02/11/2022 17:12   DG Chest Portable 1 View  Result Date:  02/11/2022 CLINICAL DATA:  Tube placement EXAM: PORTABLE CHEST 1 VIEW COMPARISON:  02/11/2022 FINDINGS: Interval intubation, tip of the endotracheal tube is about 3.3 cm superior to carina. Esophageal tube tip poorly visible in the region of GE junction. Low lung volumes. Cardiomegaly with central congestion. No pleural effusion or pneumothorax. IMPRESSION: 1. Endotracheal tube tip about 3.3 cm superior to carina 2. Low lung volumes with cardiomegaly and central congestion Electronically Signed   By: Donavan Foil M.D.   On: 02/11/2022 17:12   DG Chest 2 View  Result Date: 02/11/2022 CLINICAL DATA:  Hypoxia EXAM: CHEST - 2 VIEW COMPARISON:  12/26/2020 FINDINGS: Cardiomegaly. Pulmonary vascular congestion. No overt pulmonary edema. No focal airspace consolidation. No pleural effusion or pneumothorax. IMPRESSION: Cardiomegaly and pulmonary vascular congestion without overt pulmonary edema. Electronically Signed   By: Davina Poke D.O.   On: 02/11/2022 10:59      Assessment/Plan 1. Pulmonary Embolism Suspicion   This is a very difficult situation.  The patient has signs and symptoms that are concerning for possible massive pulmonary embolism.  The patient has been therapeutic on heparin for the last 4 days however no significant noted improvement.  The patient's respiratory status is very tenuous and minimal movement causes significant desaturation and instability.  Based on this we would not be able to have the patient undergo a VQ scan, CT scan or even a pulmonary angiogram.  Given the high suspicion for pulmonary embolism as well as the critical status of the patient, we recommend that the patient have 100 mg tPA bolus to determine if there is improvement in pulmonary status.  If the patient is shown to improve we may be able to consider a pulmonary angiogram with possible thrombectomy as he becomes more stable.     Thank you for allowing [place] to participate in the care of this patient.   Kris Hartmann, NP  Vein and Vascular Surgery [phone] (Office Phone) [phone] (Office Fax) [phone] (Pager)  02/19/2022 10:50 AM  Staff may message me via secure chat in [place]  but this may not receive immediate response,  please page for urgent matters!  Dictation software was used to generate the above note. Typos may occur and escape review, as with typed/written notes. Any error is purely unintentional.  Please contact me directly for clarity if needed.

## 2022-02-19 NOTE — Consult Note (Signed)
PHARMACY CONSULT NOTE  Pharmacy Consult for Electrolyte Monitoring and Replacement   Recent Labs: Potassium (mmol/L)  Date Value  02/19/2022 5.7 (H)   Magnesium (mg/dL)  Date Value  02/19/2022 2.9 (H)   Calcium (mg/dL)  Date Value  02/19/2022 8.2 (L)   Albumin (g/dL)  Date Value  02/11/2022 3.5   Phosphorus (mg/dL)  Date Value  02/19/2022 3.4   Sodium (mmol/L)  Date Value  02/19/2022 151 (H)   Assessment: Patient is a 46 y/o M with medical history including DM, HTN, pancreatitis, tobacco use disorder, systolic CHF, OSA, DM c/b diabetic neuropathy, lumbar radiculopathy, morbid obesity who is admitted with acute respiratory failure in setting of acute CHF and hypertensive urgency. Patient is currently intubated, sedated, and on mechanical ventilation in the ICU. Pharmacy consulted to assist with electrolyte monitoring and replacement as indicated.  Nutrition: Tube feeds started 7/26 Diuretics: IV Lasix 40 mg BID (discontinued 8/1 for AKI)  Pending nephrology consult for AKI  Goal of Therapy:  Electrolytes within normal limits  Plan:  --Na 151 (stable), defer management to PCCM (free water flushes increased 8/1) --K 5.7, Lokelma 10 g per tube x 1 dose --Re-check K+ at 1400 and all other electrolytes with AM labs tomorrow  Benita Gutter 02/19/2022 7:26 AM

## 2022-02-19 NOTE — Progress Notes (Signed)
Recruitment maneuver done PCV 0/40 100% x 1 minute, saturation dropped from 97% to 90%

## 2022-02-20 ENCOUNTER — Inpatient Hospital Stay: Payer: Medicaid Other

## 2022-02-20 ENCOUNTER — Ambulatory Visit: Payer: Medicaid Other | Admitting: Family

## 2022-02-20 DIAGNOSIS — R609 Edema, unspecified: Secondary | ICD-10-CM | POA: Diagnosis not present

## 2022-02-20 DIAGNOSIS — Z7189 Other specified counseling: Secondary | ICD-10-CM | POA: Diagnosis not present

## 2022-02-20 DIAGNOSIS — Z515 Encounter for palliative care: Secondary | ICD-10-CM | POA: Diagnosis not present

## 2022-02-20 DIAGNOSIS — J9601 Acute respiratory failure with hypoxia: Secondary | ICD-10-CM | POA: Diagnosis not present

## 2022-02-20 LAB — BLOOD GAS, ARTERIAL
Acid-Base Excess: 1.6 mmol/L (ref 0.0–2.0)
Bicarbonate: 30.3 mmol/L — ABNORMAL HIGH (ref 20.0–28.0)
FIO2: 75 %
MECHVT: 500 mL
Mechanical Rate: 30
O2 Saturation: 95.5 %
PEEP: 18 cmH2O
Patient temperature: 37
pCO2 arterial: 66 mmHg (ref 32–48)
pH, Arterial: 7.27 — ABNORMAL LOW (ref 7.35–7.45)
pO2, Arterial: 74 mmHg — ABNORMAL LOW (ref 83–108)

## 2022-02-20 LAB — GLUCOSE, CAPILLARY
Glucose-Capillary: 308 mg/dL — ABNORMAL HIGH (ref 70–99)
Glucose-Capillary: 294 mg/dL — ABNORMAL HIGH (ref 70–99)
Glucose-Capillary: 168 mg/dL — ABNORMAL HIGH (ref 70–99)
Glucose-Capillary: 179 mg/dL — ABNORMAL HIGH (ref 70–99)
Glucose-Capillary: 204 mg/dL — ABNORMAL HIGH (ref 70–99)
Glucose-Capillary: 253 mg/dL — ABNORMAL HIGH (ref 70–99)
Glucose-Capillary: 256 mg/dL — ABNORMAL HIGH (ref 70–99)
Glucose-Capillary: 264 mg/dL — ABNORMAL HIGH (ref 70–99)

## 2022-02-20 LAB — CULTURE, RESPIRATORY W GRAM STAIN: Culture: NO GROWTH

## 2022-02-20 LAB — CBC WITH DIFFERENTIAL/PLATELET
Abs Immature Granulocytes: 0.16 10*3/uL — ABNORMAL HIGH (ref 0.00–0.07)
Basophils Absolute: 0 10*3/uL (ref 0.0–0.1)
Basophils Relative: 0 %
Eosinophils Absolute: 0 10*3/uL (ref 0.0–0.5)
Eosinophils Relative: 0 %
HCT: 34.6 % — ABNORMAL LOW (ref 39.0–52.0)
Hemoglobin: 9.7 g/dL — ABNORMAL LOW (ref 13.0–17.0)
Immature Granulocytes: 1 %
Lymphocytes Relative: 6 %
Lymphs Abs: 1 10*3/uL (ref 0.7–4.0)
MCH: 26.9 pg (ref 26.0–34.0)
MCHC: 28 g/dL — ABNORMAL LOW (ref 30.0–36.0)
MCV: 95.8 fL (ref 80.0–100.0)
Monocytes Absolute: 1.3 10*3/uL — ABNORMAL HIGH (ref 0.1–1.0)
Monocytes Relative: 8 %
Neutro Abs: 14 10*3/uL — ABNORMAL HIGH (ref 1.7–7.7)
Neutrophils Relative %: 85 %
Platelets: 192 10*3/uL (ref 150–400)
RBC: 3.61 MIL/uL — ABNORMAL LOW (ref 4.22–5.81)
RDW: 16 % — ABNORMAL HIGH (ref 11.5–15.5)
WBC: 16.6 10*3/uL — ABNORMAL HIGH (ref 4.0–10.5)
nRBC: 0.5 % — ABNORMAL HIGH (ref 0.0–0.2)

## 2022-02-20 LAB — BASIC METABOLIC PANEL
Anion gap: 7 (ref 5–15)
BUN: 125 mg/dL — ABNORMAL HIGH (ref 6–20)
CO2: 27 mmol/L (ref 22–32)
Calcium: 8.4 mg/dL — ABNORMAL LOW (ref 8.9–10.3)
Chloride: 117 mmol/L — ABNORMAL HIGH (ref 98–111)
Creatinine, Ser: 3.94 mg/dL — ABNORMAL HIGH (ref 0.61–1.24)
GFR, Estimated: 18 mL/min — ABNORMAL LOW (ref >60)
Glucose, Bld: 322 mg/dL — ABNORMAL HIGH (ref 70–99)
Potassium: 5.4 mmol/L — ABNORMAL HIGH (ref 3.5–5.1)
Sodium: 151 mmol/L — ABNORMAL HIGH (ref 135–145)

## 2022-02-20 LAB — C-REACTIVE PROTEIN: CRP: 26.1 mg/dL — ABNORMAL HIGH (ref <1.0)

## 2022-02-20 LAB — PHOSPHORUS: Phosphorus: 4.8 mg/dL — ABNORMAL HIGH (ref 2.5–4.6)

## 2022-02-20 LAB — MAGNESIUM: Magnesium: 3.3 mg/dL — ABNORMAL HIGH (ref 1.7–2.4)

## 2022-02-20 LAB — PROCALCITONIN: Procalcitonin: 7.3 ng/mL

## 2022-02-20 LAB — HEPARIN LEVEL (UNFRACTIONATED): Heparin Unfractionated: 0.49 IU/mL (ref 0.30–0.70)

## 2022-02-20 MED ORDER — INSULIN GLARGINE-YFGN 100 UNIT/ML ~~LOC~~ SOLN
20.0000 [IU] | Freq: Two times a day (BID) | SUBCUTANEOUS | Status: DC
  Administered 2022-02-20 – 2022-02-23 (×7): 20 [IU] via SUBCUTANEOUS
  Filled 2022-02-20 (×8): qty 0.2

## 2022-02-20 MED ORDER — RENA-VITE PO TABS
1.0000 | ORAL_TABLET | Freq: Every day | ORAL | Status: DC
  Administered 2022-02-20 – 2022-02-24 (×5): 1
  Filled 2022-02-20 (×5): qty 1

## 2022-02-20 MED ORDER — DEXAMETHASONE SODIUM PHOSPHATE 10 MG/ML IJ SOLN
8.0000 mg | Freq: Three times a day (TID) | INTRAMUSCULAR | Status: DC
  Administered 2022-02-20 – 2022-02-21 (×3): 8 mg via INTRAVENOUS
  Filled 2022-02-20 (×4): qty 0.8

## 2022-02-20 MED ORDER — DIPHENHYDRAMINE HCL 50 MG/ML IJ SOLN
25.0000 mg | Freq: Three times a day (TID) | INTRAMUSCULAR | Status: DC
  Administered 2022-02-20 – 2022-02-21 (×3): 25 mg via INTRAVENOUS
  Filled 2022-02-20 (×3): qty 1

## 2022-02-20 MED ORDER — SODIUM ZIRCONIUM CYCLOSILICATE 5 G PO PACK
10.0000 g | PACK | Freq: Once | ORAL | Status: AC
  Administered 2022-02-20: 10 g
  Filled 2022-02-20: qty 2

## 2022-02-20 NOTE — Progress Notes (Signed)
Recruitment maneuver performed, pt tolerated well with no complications

## 2022-02-20 NOTE — Progress Notes (Signed)
Recruitment maneuver performed. Patient tolerated interventions well

## 2022-02-20 NOTE — Progress Notes (Signed)
NAME:  SHELLEY POOLEY, MRN:  [id number], DOB:  01/25/1976, LOS: 9 ADMISSION DATE:  02/11/2022, CONSULTATION DATE: 02/11/22 REFERRING MD:  Eugenie Norrie MD  CHIEF COMPLAINT: SOB   CC  follow up RESP FAILURE/acute CHF exacerbation  HPI/SYNOPSIS  46 y.o male with significant PMH as below who presented to the ED with chief complaints of SOB and worsening bilateral lower extremities edema.  ED Course: In the emergency department, the temperature was 37.3C, the heart rate 99 beats/minute, the blood pressure 187/102 mm Hg, the respiratory rate 20 breaths/minute, and the oxygen saturation 89% on. He was noted to be drowsy but still protecting his airway and responding appropriately.  Pertinent INITIAL Labs/Diagnostics Findings: Chemistry:Glucose:319 Calcium: 8.8 otherwise unremarkable CBC: unremarkable Other Lab findings: ABG with hypoxemia and hypercapnia, .  Imaging: Chest x-ray showed bilateral infiltrates worse on right  -02/17/22- patient is severely critically ill with bilateral multifocal pneumonia on sedation and paralysis with mechanical ventilation maximal settings.  He is at cusp of death, we met with daughter today and discussed severity of illness. Unable to perform CT due to severity of critical illness. Met with previous PCCM doc and discussed medical plan.  Patient had recruitment maneuvers last few days without improvement, on heparin gtt for possible PE.   02/18/22- patient continues to require maximal settings on ventilator despite good UOP.  He destaturated to <50% spO2 on MV today required BGV.   02/19/22- patient continues to require 100% FiO2 on maximal setting with high PEEP ladder for possible ARDS.  He was diuresed and on steroids with development of AKI.  His CXR had slight interval improvement on right. We discussed case with vascular surgery regarding possible empiric tPA for PE.  02/20/22- patient was unable to get CT today due to continued severe hypoxemia.  Daughter and other  family members at bedside we reviewed case and medical plan.  There seems to be some confusion from family as they had asked me to wake patient up and take off ventilator even though I had explained multiple times that he is at very high risk for death.  They laughed at my comments and seemed to think it was not true. We may still be able to do bronchoscopy but its high risk.  We have reduced IV infusions by switching some medications to OGT route.   Significant Hospital Events   7/25: Admitted to hospitalist service w/acute on chronic hypoxic hypercapnic resp. failure requiring BiPAP.Failed BiPAP and intubated. PCCM consulted 7/26: INTUBATED, difficlut to sedate, started [place] 7/27: severe hypoxia, PEEP increased to 15 but decreased back to 10 7/28: Persistent severe hypoxia, ventilator changes made 7/29: Persistent hypoxia, recruitment maneuvers instituted, ventilator changes made, empiric heparin as patient cannot be scanned query PE  Consults:  PCCM  Procedures:  7/25: Intubation 7/26: PICC triple-lumen, right basilic vein  Significant Diagnostic Tests:  7/25  Chest Xray:Chest x-ray showed cardiomegaly and pulmonary vascular congestion without overt pulmonary edema 7/25 Echocardiogram: difficult study, LVEF was estimated at 55 to 60%, dilated LV moderate LVH, grade 1 DD, enlargement of the right ventricle, left atrial size moderately dilated, right atrial size moderately dilated this is consistent with restrictive physiology (echo reading not congruous with prior echoes from [facility]) 7/30 Limited echo: LVEF 40 to 45% decreased LV function, no wall motion abnormalities, moderate dilation of the LV concentric LVH, right ventricular size moderately enlarged, right atrial enlargement, this is more consistent with prior [facility] scans  Micro Data:  7/25: SARS-CoV-2  PCR> negative 7/25: MRSA PCR>> NEG 7/27: Strep pneumoniae Ag>> negative 7/27: Legionella  Ag>> 7/27: Mycoplasma>> 7/27: Sputum>> Strep pneumo,Haemophilus influenzae 7/30 Sputum cult >>  Antimicrobials:  7/27: Maxipime >> 7/27: Vancomycin >> 7/29 7/30: Vancomycin (resumed due to fever spike on Maxipime) ? Resistant Strep pneumo   REVIEW OF SYSTEMS Patient is unable to provide complete review of systems due to severe critical illness/ventilator dependence  OBJECTIVE  Blood pressure 133/72, pulse 100, temperature (!) 100.8 F (38.2 C), temperature source Esophageal, resp. rate (!) 0, height 5' 9.02" (1.753 m), weight (!) 208 kg, SpO2 99 %. CVP:  [23 mmHg-27 mmHg] 27 mmHg   Vent Mode: PRVC FiO2 (%):  [75 %-100 %] 75 % Set Rate:  [18 bmp-30 bmp] 30 bmp Vt Set:  [500 mL] 500 mL PEEP:  [18 cmH20] 18 cmH20 Plateau Pressure:  [30 cmH20-33 cmH20] 30 cmH20   Intake/Output Summary (Last 24 hours) at 02/20/2022 1036 Last data filed at 02/20/2022 9480 Gross per 24 hour  Intake 4748.43 ml  Output 2470 ml  Net 2278.43 ml    Filed Weights   02/18/22 0454 02/19/22 0400 02/20/22 0420  Weight: (!) 195 kg (!) 197 kg (!) 208 kg   PHYSICAL EXAMINATION: GENERAL: Morbidly obese man, intubated, mechanically ventilated, sedated.  Malodorous mouth secretions. HEAD: Normocephalic, atraumatic.  EYES: Pupils equal, round, reactive to light.  No scleral icterus.  MOUTH: Macroglossia (massive).  Orotracheally intubated, OG in place. NECK: Supple. No thyromegaly. Trachea midline. No JVD.  No adenopathy. PULMONARY: Good air entry bilaterally.  Occasional rhonchi noted. CARDIOVASCULAR: S1 and S2.  Sinus tach.  ABDOMEN: Obese, soft, nondistended, tolerating tube feeds. MUSCULOSKELETAL: No joint deformity, no clubbing, no edema.  SCDs in place. NEUROLOGIC: Sedated and paralyzed on ventilator GCS 4T SKIN: Intact,warm,dry.  Chronic stasis changes in the lower extremities. PSYCH: Not assessed due to mechanically ventilated status.  Labs/imaging that I havepersonally reviewed    Labs    CBC: Recent Labs  Lab 02/16/22 0301 02/17/22 0305 02/18/22 0326 02/19/22 0412 02/20/22 0359  WBC 12.1* 14.6* 15.3* 12.3* 16.6*  NEUTROABS 8.7* 11.6* 11.9* 10.3* 14.0*  HGB 9.7* 9.5* 10.8* 10.1* 9.7*  HCT 34.6* 34.7* 39.8 37.4* 34.6*  MCV 93.8 98.6 96.8 96.6 95.8  PLT 154 134* 172 181 192     Basic Metabolic Panel: Recent Labs  Lab 02/16/22 0301 02/17/22 0305 02/18/22 0326 02/18/22 1331 02/19/22 0412 02/19/22 0920 02/19/22 1337 02/20/22 0359  NA 145 145 151* 151* 151*  --   --  151*  K 3.5 3.9 5.0 5.3* 5.7* 5.5* 5.8* 5.4*  CL 112* 112* 111 114* 113*  --   --  117*  CO2 31 27 32 31 30  --   --  27  GLUCOSE 95 181* 177* 222* 246*  --   --  322*  BUN 38* 36* 72* 72* 94*  --   --  125*  CREATININE 0.90 0.84 1.88* 1.64* 2.61*  --   --  3.94*  CALCIUM 7.7* 6.8* 8.3* 8.4* 8.2*  --   --  8.4*  MG 2.1 2.1 2.7*  --  2.9*  --   --  3.3*  PHOS 2.4* 4.6 4.1  --  3.4  --   --  4.8*    GFR: Estimated Creatinine Clearance: 42.5 mL/min (A) (by C-G formula based on SCr of 3.94 mg/dL (H)). Recent Labs  Lab 02/13/22 1146 02/13/22 1625 02/14/22 0445 02/17/22 0305 02/18/22 0326 02/19/22 0412 02/19/22 0920 02/20/22 0359  PROCALCITON  --   --   --   --   --   --  8.62 7.30  WBC  --   --    < > 14.6* 15.3* 12.3*  --  16.6*  LATICACIDVEN 2.4* 2.6*  --   --   --   --   --   --    < > = values in this interval not displayed.     Liver Function Tests: No results for input(s): "AST", "ALT", "ALKPHOS", "BILITOT", "PROT", "ALBUMIN" in the last 168 hours.  ABG    Component Value Date/Time   PHART 7.27 (L) 02/20/2022 0432   PCO2ART 66 (HH) 02/20/2022 0432   PO2ART 74 (L) 02/20/2022 0432   HCO3 30.3 (H) 02/20/2022 0432   TCO2 26 03/17/2007 1822   O2SAT 95.5 02/20/2022 0432     Home Medications  Prior to Admission medications   Medication Sig Start Date End Date Taking? Authorizing Provider  losartan (COZAAR) 100 MG tablet Take 100 mg by mouth daily. 02/01/22  Yes [provider]  OZEMPIC, 0.25 OR 0.5 MG/DOSE, 2 MG/3ML SOPN SMARTSIG:0.25 Milligram(s) SUB-Q Once a Week 12/09/21  Yes [provider]  torsemide (DEMADEX) 20 MG tablet Take 20 mg by mouth daily. 12/18/21  Yes [provider]  Aspirin-Salicylamide-Caffeine (BC HEADACHE POWDER PO) Take 1 packet by mouth as needed (for pain).    [provider]  cephALEXin (KEFLEX) 500 MG capsule Take 1 capsule (500 mg total) by mouth 4 (four) times daily. Patient not taking: Reported on 02/11/2022 12/16/18   Benjiman Core, MD  cyclobenzaprine (FLEXERIL) 10 MG tablet Take 0.5-1 tablets (5-10 mg total) by mouth 2 (two) times daily as needed for muscle spasms. Patient not taking: Reported on 02/11/2022 03/12/18   Arthor Captain, PA-C  gabapentin (NEURONTIN) 600 MG tablet Take 600 mg by mouth at bedtime. Patient not taking: Reported on 02/11/2022 10/13/21   [provider]  LABETALOL HCL PO Take by mouth. Patient not taking: Reported on 02/11/2022    [provider]  meloxicam (MOBIC) 15 MG tablet Take 1 tablet (15 mg total) by mouth daily. Take 1 daily with food. Patient not taking: Reported on 02/11/2022 03/12/18   Arthor Captain, PA-C  metFORMIN (GLUCOPHAGE) 500 MG tablet Take 500 mg by mouth 2 (two) times daily with a meal. Patient not taking: Reported on 02/11/2022    [provider]  METFORMIN HCL ER, MOD, PO Take by mouth. Patient not taking: Reported on 02/11/2022    [provider]  METOPROLOL SUCCINATE ER PO Take by mouth. Patient not taking: Reported on 02/11/2022    [provider]  oxyCODONE-acetaminophen (PERCOCET) 10-325 MG tablet Take 1 tablet by mouth See admin instructions. Take 1 tablet by mouth 4-5 times a day as needed for pain Patient not taking: Reported on 02/11/2022    [provider]  oxyCODONE-acetaminophen (PERCOCET/ROXICET) 5-325 MG tablet Take 1 tablet by mouth every 8 (eight) hours as needed for severe  pain. Patient not taking: Reported on 02/11/2022 12/16/18   Benjiman Core, MD  predniSONE (DELTASONE) 20 MG tablet Take 2 tablets (40 mg total) by mouth daily. Patient not taking: Reported on 02/11/2022 04/04/15   Benjiman Core, MD   Hospital Scheduled Meds:  artificial tears  1 Application Both Eyes Q8H   budesonide (PULMICORT) nebulizer solution  0.5 mg Nebulization BID   Chlorhexidine Gluconate Cloth  6 each Topical Q0600   feeding supplement (PROSource TF)  90 mL Per Tube 5 X Daily   free water  100 mL Per Tube Q4H   insulin aspart  0-20 Units Subcutaneous Q4H   insulin aspart  8 Units Subcutaneous Q4H   insulin glargine-yfgn  20 Units Subcutaneous BID   insulin starter kit- pen needles  1 kit Other Once   ipratropium-albuterol  3 mL Nebulization Q6H   living well with diabetes book   Does not apply Once   methylPREDNISolone (SOLU-MEDROL) injection  40 mg Intravenous BID   mouth rinse  15 mL Mouth Rinse Q2H   oxyCODONE  10 mg Per Tube Q6H   pantoprazole sodium  40 mg Per Tube QHS   phenobarbital  97.2 mg Per Tube TID   polyethylene glycol  17 g Per Tube Daily   senna-docusate  1 tablet Per Tube BID   sodium chloride flush  10-40 mL Intracatheter Q12H   sodium chloride flush  3 mL Intravenous Q12H   Continuous Infusions:  sodium chloride 250 mL (02/19/22 1011)   sodium chloride Stopped (02/11/22 2011)   cefTRIAXone (ROCEPHIN)  IV 2 g (02/20/22 1026)   feeding supplement (VITAL 1.5 CAL) 45 mL/hr at 02/20/22 0937   fentaNYL infusion INTRAVENOUS 300 mcg/hr (02/20/22 0937)   heparin 2,000 Units/hr (02/20/22 0937)   ketamine (KETALAR) 2,500 mg in sodium chloride 0.9 % 500 mL (5 mg/mL) infusion 0.8 mg/kg/hr (02/20/22 0937)   midazolam 10 mg/hr (02/20/22 0937)   PRN Meds:.sodium chloride, acetaminophen **OR** acetaminophen, cyclobenzaprine, docusate sodium, fentaNYL, midazolam, ondansetron **OR** ondansetron (ZOFRAN) IV, mouth rinse, polyethylene glycol, sodium chloride flush,  sodium chloride flush, traZODone      Active Hospital Problem list    Patient Active Problem List   Diagnosis Date Noted   Acute respiratory failure ([place]) 02/11/2022   Respiratory failure ([place]) 02/11/2022   Acute respiratory failure with hypoxia and hypercarbia ([facility]) 02/11/2022   Acute on chronic systolic CHF (congestive heart failure) ([place]) 02/11/2022   Hypertensive urgency 02/11/2022   Type 2 diabetes mellitus with peripheral neuropathy ([place]) 02/11/2022   OBSTRUCTIVE SLEEP APNEA 04/30/2009    Assessment & Plan:   46 yo morbidly obese AAM with severe decompensated acute systolic and diastolic heart failure with previously documented severe OSA/OHS, noncompliant with CPAP, active smoker, leading to severe hypoxic resp failure with encephalopathy and difficulty with sedation  Severe acute on chronic hypoxic and hypercapnic respiratory failure due to CAP and acute decompensation of chronic heart failure Persistent hypoxemia despite trial of several ventilator strategies continue Mechanical Ventilator support Wean Fio2 and PEEP as tolerated Continue VAP/VENT bundles  Wean PEEP & FiO2 as tolerated, maintain SpO2 > 88% Head of bed elevated 30 degrees, VAP protocol in place Plateau pressures less than 30 cm H20 as able Intermittent chest x-ray & ABG PRN Ensure adequate pulmonary hygiene  will NOT perform SAT/SBT due to high FiO2 requirements, asynchrony off sedation  Requiring paralytic infusion for ventilator synchrony Recruitment maneuvers every 4h and as needed. On heparin infusion as cannot exclude PE, however no DVT on LE Dopplers assuring, Patient cannot be proned, due to weight limit for safe proning and unstable airway (ET tube with constant need of repositioning) Vascular surgery consult - possible PE , we are considering tPA - appreciate recommendations   Community-acquired pneumonia: Streptococcus pneumoniae and Haemophilus influenzae Fever spike today Sputum  re-cultured Changed antibiotics to ceftaz today -  Acute on chronic systolic and diastolic heart failure LVEF 40 to 45% consistent with prior studies performed at [facility] ventilator support Lasix twice daily IV Trial of milrinone Creatinine 0.9 today Diamox 500 mg x 1 Hold metoprolol and Losartan  due to sedation related hypotension 2D Echocardiogram shows diastolic dysfunction and enlarged left and right atria consistent with restrictive physiology  ACUTE KIDNEY INJURY/Renal Failure -continues to improve Element of cardiorenal syndrome -continue Foley catheter-assess need -Avoid nephrotoxic agents -Follow urine output, BMP -Ensure adequate renal perfusion, optimize oxygenation -Renal dose medications -Nephrology has signed off -On Lasix twice daily -Added milrinone   Intake/Output Summary (Last 24 hours) at 02/20/2022 1036 Last data filed at 02/20/2022 4132 Gross per 24 hour  Intake 4748.43 ml  Output 2470 ml  Net 2278.43 ml        Latest Ref Rng & Units 02/20/2022    3:59 AM 02/19/2022    1:37 PM 02/19/2022    9:20 AM  BMP  Glucose 70 - 99 mg/dL 322     BUN 6 - 20 mg/dL 125     Creatinine 0.61 - 1.24 mg/dL 3.94     Sodium 135 - 145 mmol/L 151     Potassium 3.5 - 5.1 mmol/L 5.4  5.8  5.5   Chloride 98 - 111 mmol/L 117     CO2 22 - 32 mmol/L 27     Calcium 8.9 - 10.3 mg/dL 8.4       ENDO - ICU hypoglycemic\Hyperglycemia protocol -check FSBS per protocol -Diabetes mellitus -HgbA1c  10.1 -CBG's AC & hs; Target range of 140 to 180 -SSI -Follow ICU Hypo/Hyperglycemia protocol -Hold Metformin  GI GI PROPHYLAXIS PPI  NUTRITIONAL STATUS DIET-->TF's as tolerated Constipation protocol as indicated  ACUTE ANEMIA- Transfuse as needed Consider transfusion  if hgb<7  Best practice:  Diet: Tube feeds Pain/Anxiety/Delirium protocol (if indicated): Yes (RASS goal 0) VAP protocol (if indicated): Yes DVT prophylaxis: Systemic AC GI prophylaxis:  PPI Glucose control:  SSI Yes Central venous access:  N/A Arterial line:  N/A Foley:  Yes, and it is still needed Mobility:  bed rest  PT consulted: N/A Last date of multidisciplinary goals of care discussion [7/25] Code Status:  full code Disposition: ICU   Critical care provider statement:   Total critical care time: 33 minutes   Performed by: Lanney Gins MD   Critical care time was exclusive of separately billable procedures and treating other patients.   Critical care was necessary to treat or prevent imminent or life-threatening deterioration.   Critical care was time spent personally by me on the following activities: development of treatment plan with patient and/or surrogate as well as nursing, discussions with consultants, evaluation of patient's response to treatment, examination of patient, obtaining history from patient or surrogate, ordering and performing treatments and interventions, ordering and review of laboratory studies, ordering and review of radiographic studies, pulse oximetry and re-evaluation of patient's condition.    Ottie Glazier, M.D.  Pulmonary & Critical Care Medicine

## 2022-02-20 NOTE — Consult Note (Signed)
ANTICOAGULATION CONSULT NOTE  Pharmacy Consult for IV Heparin Indication: pulmonary embolus (rule-out / work-up pending)  Patient Measurements: Height: 5' 9.02" (175.3 cm) Weight: (!) 208 kg (458 lb 8.9 oz) IBW/kg (Calculated) : 70.74 Heparin Dosing Weight: 121 kg  Labs: Recent Labs    02/17/22 1651 02/18/22 0326 02/18/22 0326 02/18/22 1331 02/19/22 0412 02/19/22 0920 02/19/22 2124 02/20/22 0359  HGB  --  10.8*   < >  --  10.1*  --   --  9.7*  HCT  --  39.8  --   --  37.4*  --   --  34.6*  PLT  --  172  --   --  181  --   --  192  HEPARINUNFRC  --   --   --   --   --   --  0.44 0.49  HEPRLOWMOCWT 0.67  --   --   --   --  0.27  --   --   CREATININE  --  1.88*  --  1.64* 2.61*  --   --   --   CKTOTAL  --   --   --   --  663*  --   --   --    < > = values in this interval not displayed.     Estimated Creatinine Clearance: 64.2 mL/min (A) (by C-G formula based on SCr of 2.61 mg/dL (H)).   Medical History: Past Medical History:  Diagnosis Date   Diabetes mellitus without complication ([place])    Hypertension    Pancreatitis     Medications:  --No anticoagulation prior to admission per my chart review --Patient was on therapeutic IV heparin from 7/29 thru 7/31  --Patient was on therapeutic Lovenox from 7/31 thru 8/2  Assessment: Patient is a 46 y/o M with medical history including DM, HTN, pancreatitis, tobacco use disorder, systolic CHF, OSA, morbid obesity who is admitted with severe respiratory failure requiring mechanical ventilatory support. There is concern for pulmonary embolism. Work-up is still pending. Pharmacy consulted to re-initiate IV heparin from therapeutic Lovenox partly secondary to AKI / concern for reduced clearance.   Patient was previously on IV heparin this admission and was therapeutic at 2000 units/hr  Goal of Therapy:  Heparin level 0.3-0.7 units/ml Monitor platelets by anticoagulation protocol: Yes   Plan:  8/3:  HL @ 0359 = 0.49,  therapeutic X 2 Will continue pt on current rate and recheck HL on 8/4 with AM labs.   [place] Olund D 02/20/2022,4:37 AM

## 2022-02-20 NOTE — Progress Notes (Addendum)
[place], [place] 02/20/22  Subjective:   Hospital day # 9  [name] is a 46 y.o.  male with past medical history of diabetes, hypertension, sCHF, OSA, and obesity, who was admitted to [facility] on 02/11/2022 for Acute respiratory failure ([place]) [J96.00] Respiratory failure ([place]) [J96.90] Peripheral edema [R60.9] Acute respiratory failure with hypoxia and hypercapnia ([place]) [J96.01, J96.02]  We have been re-consulted for elevated renal function. We were consulted during the initial admission for acute kidney injury which was corrected with IV Furosemide. Creatinine began to increase on 02/18/22. No exposure to IV contrast, hypotension, or nephrotoxic medications. Urine output remains adequate, 2.6L. No edema.   Remains sedated on Fentanyl, ketamine and versed No pressors Vent- weaned to 75 % FiO2 with 18 PEEP Tube feeds remain 54ml/hr Foley and flexiseal placed Cooling blanket in use No edema  Renal: 08/02 0701 - 08/03 0700 In: 4651.8 [I.V.:2191.8; NG/GT:2260; IV Piggyback:200] Out: 8329 [Urine:1150; Stool:1200] Lab Results  Component Value Date   CREATININE 3.94 (H) 02/20/2022   CREATININE 2.61 (H) 02/19/2022   CREATININE 1.64 (H) 02/18/2022     Objective:  Vital signs in last 24 hours:  Temp:  [99.3 F (37.4 C)-100.8 F (38.2 C)] 100.8 F (38.2 C) (08/03 0800) Pulse Rate:  [93-219] 98 (08/03 1000) Resp:  [0-30] 20 (08/03 1000) BP: (111-139)/(63-82) 137/74 (08/03 1000) SpO2:  [68 %-100 %] 97 % (08/03 1000) Arterial Line BP: (104-127)/(61-69) 127/69 (08/03 1000) FiO2 (%):  [75 %-100 %] 75 % (08/03 0809) Weight:  [208 kg] 208 kg (08/03 0420)  Weight change: 11 kg Filed Weights   02/18/22 0454 02/19/22 0400 02/20/22 0420  Weight: (!) 195 kg (!) 197 kg (!) 208 kg    Intake/Output:    Intake/Output Summary (Last 24 hours) at 02/20/2022 1122 Last data filed at 02/20/2022 1043 Gross per 24 hour  Intake 5028.97 ml  Output 2420 ml   Net 2608.97 ml      Physical Exam: General: Critically ill-appearing, laying in the bed  HEENT ET tube, OG tube in place  Pulm/lungs Ventilator assisted, coarse breath sounds  CVS/Heart Regular  Abdomen:  Soft, nontender, nondistended  Extremities: Trace dependent edema  Neurologic: Sedated  Skin: Warm, dry  Access:        Basic Metabolic Panel:  Recent Labs  Lab 02/16/22 0301 02/17/22 0305 02/18/22 0326 02/18/22 1331 02/19/22 0412 02/19/22 0920 02/19/22 1337 02/20/22 0359  NA 145 145 151* 151* 151*  --   --  151*  K 3.5 3.9 5.0 5.3* 5.7* 5.5* 5.8* 5.4*  CL 112* 112* 111 114* 113*  --   --  117*  CO2 31 27 32 31 30  --   --  27  GLUCOSE 95 181* 177* 222* 246*  --   --  322*  BUN 38* 36* 72* 72* 94*  --   --  125*  CREATININE 0.90 0.84 1.88* 1.64* 2.61*  --   --  3.94*  CALCIUM 7.7* 6.8* 8.3* 8.4* 8.2*  --   --  8.4*  MG 2.1 2.1 2.7*  --  2.9*  --   --  3.3*  PHOS 2.4* 4.6 4.1  --  3.4  --   --  4.8*      CBC: Recent Labs  Lab 02/16/22 0301 02/17/22 0305 02/18/22 0326 02/19/22 0412 02/20/22 0359  WBC 12.1* 14.6* 15.3* 12.3* 16.6*  NEUTROABS 8.7* 11.6* 11.9* 10.3* 14.0*  HGB 9.7* 9.5* 10.8* 10.1* 9.7*  HCT  34.6* 34.7* 39.8 37.4* 34.6*  MCV 93.8 98.6 96.8 96.6 95.8  PLT 154 134* 172 181 192      No results found for: "HEPBSAG", "HEPBSAB", "HEPBIGM"    Microbiology:  Recent Results (from the past 240 hour(s))  SARS Coronavirus 2 by RT PCR (hospital order, performed in [facility] hospital lab) *cepheid single result test* Anterior Nasal Swab     Status: None   Collection Time: 02/11/22  4:33 PM   Specimen: Anterior Nasal Swab  Result Value Ref Range Status   SARS Coronavirus 2 by RT PCR NEGATIVE NEGATIVE Final    Comment: (NOTE) SARS-CoV-2 target nucleic acids are NOT DETECTED.  The SARS-CoV-2 RNA is generally detectable in upper and lower respiratory specimens during the acute phase of infection. The lowest concentration of SARS-CoV-2 viral  copies this assay can detect is 250 copies / mL. A negative result does not preclude SARS-CoV-2 infection and should not be used as the sole basis for treatment or other patient management decisions.  A negative result may occur with improper specimen collection / handling, submission of specimen other than nasopharyngeal swab, presence of viral mutation(s) within the areas targeted by this assay, and inadequate number of viral copies (<250 copies / mL). A negative result must be combined with clinical observations, patient history, and epidemiological information.  Fact Sheet for Patients:   https://www.patel.info/  Fact Sheet for Healthcare Providers: https://hall.com/  This test is not yet approved or  cleared by the [place] FDA and has been authorized for detection and/or diagnosis of SARS-CoV-2 by FDA under an Emergency Use Authorization (EUA).  This EUA will remain in effect (meaning this test can be used) for the duration of the COVID-19 declaration under Section 564(b)(1) of the Act, 21 U.S.C. section 360bbb-3(b)(1), unless the authorization is terminated or revoked sooner.  Performed at [facility], [address] [place], [place] [address]   MRSA Next Gen by PCR, Nasal     Status: None   Collection Time: 02/11/22  5:48 PM   Specimen: Nasal Mucosa; Nasal Swab  Result Value Ref Range Status   MRSA by PCR Next Gen NOT DETECTED NOT DETECTED Final    Comment: (NOTE) The GeneXpert MRSA Assay (FDA approved for NASAL specimens only), is one component of a comprehensive MRSA colonization surveillance program. It is not intended to diagnose MRSA infection nor to guide or monitor treatment for MRSA infections. Test performance is not FDA approved in patients less than 63 years old. Performed at [facility], [address] [place], [place] [address]   Culture, Respiratory w Gram Stain     Status: None    Collection Time: 02/13/22  9:16 PM   Specimen: Tracheal Aspirate; Respiratory  Result Value Ref Range Status   Specimen Description   Final    TRACHEAL ASPIRATE Performed at [facility], [address], [place], [place] [address]    Special Requests   Final    NONE Performed at [facility], [address] [place], [place] [address]    Gram Stain   Final    NO SQUAMOUS EPITHELIAL CELLS SEEN FEW WBC SEEN FEW GRAM NEGATIVE RODS MODERATE GRAM POSITIVE COCCI    Culture   Final    ABUNDANT STREPTOCOCCUS PNEUMONIAE ABUNDANT HAEMOPHILUS INFLUENZAE BETA LACTAMASE NEGATIVE NO STAPHYLOCOCCUS AUREUS ISOLATED No Pseudomonas species isolated Performed at [facility], [address] [place], [place] [address]    Report Status 02/17/2022 FINAL  Final  Organism ID, Bacteria STREPTOCOCCUS PNEUMONIAE  Final      Susceptibility   Streptococcus pneumoniae - MIC*    ERYTHROMYCIN <=0.12 SENSITIVE Sensitive     LEVOFLOXACIN 0.5 SENSITIVE Sensitive     VANCOMYCIN 0.5 SENSITIVE Sensitive     PENICILLIN (meningitis) <=0.06 SENSITIVE Sensitive     PENO - penicillin <=0.06      PENICILLIN (non-meningitis) <=0.06 SENSITIVE Sensitive     PENICILLIN (oral) <=0.06 SENSITIVE Sensitive     CEFTRIAXONE (non-meningitis) <=0.12 SENSITIVE Sensitive     CEFTRIAXONE (meningitis) <=0.12 SENSITIVE Sensitive     * ABUNDANT STREPTOCOCCUS PNEUMONIAE  Culture, Respiratory w Gram Stain     Status: None   Collection Time: 02/16/22  4:27 PM   Specimen: Tracheal Aspirate; Respiratory  Result Value Ref Range Status   Specimen Description   Final    TRACHEAL ASPIRATE Performed at [facility], [address] [place], [place] [address]    Special Requests   Final    NONE Performed at [facility], [address] [place], [place] [address]    Gram Stain   Final    RARE WBC PRESENT,BOTH PMN AND MONONUCLEAR NO ORGANISMS SEEN    Culture   Final    NO  GROWTH Performed at [facility], [address], [place], [place] [address]    Report Status 02/20/2022 FINAL  Final    Coagulation Studies: No results for input(s): "LABPROT", "INR" in the last 72 hours.  Urinalysis: No results for input(s): "COLORURINE", "LABSPEC", "PHURINE", "GLUCOSEU", "HGBUR", "BILIRUBINUR", "KETONESUR", "PROTEINUR", "UROBILINOGEN", "NITRITE", "LEUKOCYTESUR" in the last 72 hours.  Invalid input(s): "APPERANCEUR"    Imaging: DG Chest Port 1 View  Result Date: 02/20/2022 CLINICAL DATA:  Acute on chronic respiratory failure EXAM: PORTABLE CHEST 1 VIEW COMPARISON:  Yesterday FINDINGS: Cardiomegaly and diffuse pulmonary opacity. Improved aeration of the left upper lobe compared to prior. Endotracheal tube with tip at the clavicular heads. The enteric tube reaches the stomach. Right PICC with tip at the SVC. IMPRESSION: 1. Unremarkable hardware positioning. 2. Bilateral airspace disease with interval reinflation of the left upper lobe. Electronically Signed   By: Tiburcio Pea M.D.   On: 02/20/2022 05:31   DG Chest Port 1 View  Result Date: 02/19/2022 CLINICAL DATA:  Pulmonary edema EXAM: PORTABLE CHEST 1 VIEW COMPARISON:  Chest x-ray dated February 17, 2022 FINDINGS: Stable position of ET tube, enteric tube and right arm PICC. Cardiac and mediastinal contours are not visualized. New near complete opacification of the left hemithorax, likely due to lobar collapse. Decreased consolidation of the right upper lobe. No definite pleural effusion pneumothorax. IMPRESSION: 1. New near complete opacification of the left hemithorax, likely due to lobar collapse. 2. Decreased consolidation of the right upper lobe. Electronically Signed   By: Allegra Lai M.D.   On: 02/19/2022 08:43   DG Abd 1 View  Result Date: 02/18/2022 CLINICAL DATA:  Orogastric tube placement. EXAM: ABDOMEN - 1 VIEW COMPARISON:  02/12/2019 FINDINGS: Moderate to markedly limited exam secondary to patient's  size and technique. Multiple overlying wires and leads. A nasogastric tube is followed to the level of the gastric body. IMPRESSION: Nasogastric tube terminates at the gastric body. Otherwise, severely limited exam. Electronically Signed   By: Jeronimo Greaves M.D.   On: 02/18/2022 14:39     Medications:    sodium chloride 250 mL (02/19/22 1011)   sodium chloride Stopped (02/11/22 2011)   cefTRIAXone (ROCEPHIN)  IV 2 g (02/20/22 1026)  feeding supplement (VITAL 1.5 CAL) 45 mL/hr at 02/20/22 0937   fentaNYL infusion INTRAVENOUS 300 mcg/hr (02/20/22 0937)   heparin 2,000 Units/hr (02/20/22 0937)   ketamine (KETALAR) 2,500 mg in sodium chloride 0.9 % 500 mL (5 mg/mL) infusion 0.8 mg/kg/hr (02/20/22 0937)   midazolam 10 mg/hr (02/20/22 0937)    artificial tears  1 Application Both Eyes X7W   budesonide (PULMICORT) nebulizer solution  0.5 mg Nebulization BID   Chlorhexidine Gluconate Cloth  6 each Topical Q0600   feeding supplement (PROSource TF)  90 mL Per Tube 5 X Daily   free water  100 mL Per Tube Q4H   insulin aspart  0-20 Units Subcutaneous Q4H   insulin aspart  8 Units Subcutaneous Q4H   insulin glargine-yfgn  20 Units Subcutaneous BID   insulin starter kit- pen needles  1 kit Other Once   ipratropium-albuterol  3 mL Nebulization Q6H   living well with diabetes book   Does not apply Once   methylPREDNISolone (SOLU-MEDROL) injection  40 mg Intravenous BID   mouth rinse  15 mL Mouth Rinse Q2H   oxyCODONE  10 mg Per Tube Q6H   pantoprazole sodium  40 mg Per Tube QHS   phenobarbital  97.2 mg Per Tube TID   polyethylene glycol  17 g Per Tube Daily   senna-docusate  1 tablet Per Tube BID   sodium chloride flush  10-40 mL Intracatheter Q12H   sodium chloride flush  3 mL Intravenous Q12H   sodium chloride, acetaminophen **OR** acetaminophen, cyclobenzaprine, docusate sodium, fentaNYL, midazolam, ondansetron **OR** ondansetron (ZOFRAN) IV, mouth rinse, polyethylene glycol, sodium chloride  flush, sodium chloride flush, traZODone  Assessment/ Plan:  46 y.o. male with medical problems of poorly controlled diabetes, hypertension, systolic CHF, obstructive sleep apnea, obesity    admitted on 02/11/2022 for Acute respiratory failure (H[facility]) [J96.00] Respiratory failure (H[facility]) [J96.90] Peripheral edema [R60.9] Acute respiratory failure with hypoxia and hypercapnia ([facility]) [J96.01, J96.02]  #Acute kidney injury with hyperkalemia/hypernatremia.  Baseline creatinine 0.96 from February 11, 2022. Suspected secondary to fever, hypotension and sepsis. No IV contrast exposure or nephrotoxic agents. CK 663,   Potassium remains elevated at 5.4 with sodium 151. Continue to treat potassium with lokelma. We continue to recommend low potassium tube feeds to help manage elevated levels. Will continue to monitor sodium levels. Creatinine continues to rise with decreased urine output and elevated BUN, 125. Recommend vasculitis workup and patient may require dialysis if renal function does not improve by tomorrow.    #Acute respiratory failure Ventilator assisted.  Management as per ICU team.  FiO2 weaned to 75% and critical care team considering bronch.   #Acute exacerbation of systolic and diastolic CHF 2D echo from February 11, 2022-LVEF 55 to 60%, moderate LVH, grade 1 diastolic dysfunction, enlarged right ventricle, moderately dilated right atrium, moderately dilated left atrium,  #Poorly controlled diabetes type 2  Lab Results  Component Value Date   HGBA1C 10.1 (H) 02/11/2022  Steroid therapy continues. Will monitor glucose and primary team to manage.    LOS: [place] 8/3/202311:22 AM  [facility] [place], [place]

## 2022-02-20 NOTE — Inpatient Diabetes Management (Signed)
Inpatient Diabetes Program Recommendations  AACE/ADA: New Consensus Statement on Inpatient Glycemic Control   Target Ranges:  Prepandial:   less than 140 mg/dL      Peak postprandial:   less than 180 mg/dL (1-2 hours)      Critically ill patients:  140 - 180 mg/dL    Latest Reference Range & Units 02/20/22 03:08 02/20/22 07:36  Glucose-Capillary 70 - 99 mg/dL 294 (H) 168 (H)    Latest Reference Range & Units 02/19/22 07:44 02/19/22 11:37 02/19/22 15:57 02/19/22 19:08 02/19/22 23:13  Glucose-Capillary 70 - 99 mg/dL 173 (H) 212 (H) 308 (H) 290 (H) 295 (H)   Review of Glycemic Control  Current orders for Inpatient glycemic control: Semglee 20 units BID, Novolog 8 units Q4H, Novolog 0-20 units Q4H; Solumedrol 40 mg BID, Vital @ 45 ml/hr  Inpatient Diabetes Program Recommendations:    Insulin: Noted Semglee changed from 26 units daily to 20 units BID. If glucose remains consistently elevated and steroids are continued as ordered, please consider increasing Semglee to 25 units BID.  Thanks, Barnie Alderman, RN, MSN, [place] [facility] Coordinator Inpatient Diabetes Program [phone] (Team Pager from 8am to [place])

## 2022-02-20 NOTE — Progress Notes (Signed)
Recruitment maneuver performed.  Pt tolerated well with no adverse events.

## 2022-02-20 NOTE — Consult Note (Signed)
PHARMACY CONSULT NOTE  Pharmacy Consult for Electrolyte Monitoring and Replacement   Recent Labs: Potassium (mmol/L)  Date Value  02/20/2022 5.4 (H)   Magnesium (mg/dL)  Date Value  02/20/2022 3.3 (H)   Calcium (mg/dL)  Date Value  02/20/2022 8.4 (L)   Albumin (g/dL)  Date Value  02/11/2022 3.5   Phosphorus (mg/dL)  Date Value  02/20/2022 4.8 (H)   Sodium (mmol/L)  Date Value  02/20/2022 151 (H)   Assessment: Patient is a 46 y/o M with medical history including DM, HTN, pancreatitis, tobacco use disorder, systolic CHF, OSA, DM c/b diabetic neuropathy, lumbar radiculopathy, morbid obesity who is admitted with acute respiratory failure in setting of acute CHF and hypertensive urgency. Patient is currently intubated, sedated, and on mechanical ventilation in the ICU. Pharmacy consulted to assist with electrolyte monitoring and replacement as indicated.  Nutrition: Tube feeds started 7/26 Diuretics: IV Lasix 40 mg BID (discontinued 8/1 for AKI) Fluids: Free water 100 mL Q4 hours  Pending nephrology consult for AKI  Goal of Therapy:  Electrolytes within normal limits  Plan:  --Na 151 (stable), defer management to nephrology/PCCM (free water flushes increased 8/1) --K 5.7 > 5.4. Scr continues to increase. Give Lokelma 10 g per tube x 1 dose --Re-check electrolytes with AM labs tomorrow  Glean Salvo, PharmD Clinical Pharmacist  02/20/2022 7:37 AM

## 2022-02-20 NOTE — Progress Notes (Signed)
Daily Progress Note   Patient Name: Samuel Chavez       Date: 02/20/2022 DOB: 1976/02/20  Age: 46 y.o. MRN#: [id number] Attending Physician: Ottie Glazier, MD Primary Care Physician: [facility] Admit Date: 02/11/2022 Length of Stay: 9 days  Reason for Consultation/Follow-up: Establishing goals of care  HPI/Patient Profile:  46 y.o. male  with past medical history of type 2 diabetes mellitus, hypertension, pancreatitis, ongoing tobacco abuse, systolic CHF, OSA, type 2 diabetes mellitus and diabetic neuropathy, lumbar radiculopathy, and morbid obesity, who presented to the ER with acute onset of worsening lower extremity edema with associated dyspnea as well as orthopnea. He was found to have acute on chronic hypoxic and hypercapnic respiratory failure requiring BiPAP, which he failed.  He was subsequently intubated and transferred to the ICU he was admitted on 02/11/2022 with severe acute on chronic hypoxic and hypercapnic respiratory failure due to CAP and acute decompensated heart failure, persistent hypoxemia despite ventilator, community-acquired pneumonia, acute on chronic systolic and diastolic heart failure, acute kidney injury, and others.    During the patient's ICU stay he has continued to decompensate and decline despite maximal therapies.  PMT was consulted for goals of care conversation and support of patient and family.  Subjective:   Subjective: Chart Reviewed. Updates received. Patient Assessed. Created space and opportunity for patient  and family to explore thoughts and feelings regarding current medical situation.  Today's Discussion: I saw the patient at the bedside.  He is unable to meaningfully interact.  His daughter was not present at that time.  Discussed the patient and his clinical status with the nurse.  Nurse states that there has been some mild improvement and saturations during the day, although improved last night he did have to be bag and lavage.   There is still lingering question of possible PE.  He is still very critically ill. Plans for possible CT versus bronch today, if he will tolerate. I explained my role at this point with the family as per support given that the ICU team has done such a great job with sharing critical clinical details with patient's family.  I returned later in the afternoon and met with the patient's daughter to provide ongoing support.  She states that she is doing a little bit better.  She is optimistic with the mild improvements, although she knows that his kidneys are a little worse.  She does note that the FiO2 on the ventilator was decreased to 75% and saturations are improved.  Also celebrating that his fever is down some.  She was able to leave the hospital yesterday for several hours to return home and be with her brothers, eat, nap, shower.  I discussed that at any point she would like to leave the hospital for a break that the nursing staff will call her for any significant changes.  I told her I will continue to hope and pray for the best each day for her father and that I hope he continues to make improvements.  She seemed comforted by this.  I again offered that she can call for any questions or concerns.  I explained our schedule at [facility] including no palliative onsite on the weekends but 24/7 physician on call.  I provided emotional and general support through therapeutic listening, empathy, sharing of stories, and other techniques. I answered all questions and addressed all concerns to the best of my ability.   Review of Systems  Unable to perform ROS: Intubated  Objective:   Vital Signs:  BP 124/67   Pulse 99   Temp 99.9 F (37.7 C) (Esophageal)   Resp (!) 30   Ht 5' 9.02" (1.753 m)   Wt (!) 208 kg   SpO2 92%   BMI 67.69 kg/m   Physical Exam: Physical Exam Vitals and nursing note reviewed.  Constitutional:      General: He is not in acute distress.    Appearance: He is obese. He is  ill-appearing.     Interventions: He is sedated, chemically paralyzed and intubated.  HENT:     Head: Normocephalic and atraumatic.  Cardiovascular:     Rate and Rhythm: Normal rate.  Pulmonary:     Effort: Tachypnea (intended on vent settings RR=30) present. He is intubated.     Breath sounds: Decreased air movement present.  Abdominal:     General: Abdomen is protuberant.     Palpations: Abdomen is soft.  Skin:    General: Skin is warm and dry.  Neurological:     Mental Status: He is unresponsive.     Palliative Assessment/Data: 10% (feeding tube)   Assessment & Plan:   Impression: Present on Admission:  Respiratory failure ([place])  46 year old male who is critically ill with respiratory failure, pneumonia, CHF, AKI. Kidneys somewhat worse (? Eventual need for iHD vs. CRRT). Respiratory status somewhat improved today. He is currently on 75% FiO2 with a respiratory rate of 30 and PEEP of 18.  He is turned on his side and is having recruitment maneuvers. Trial of lie flat to see if he can tolerate CT chest vs. Possible Bronch. With full aggressive medical therapy he has had some mild improvement in his saturations.  His daughter seems to understand how sick he is, but continues to hold out hope for recovery.  She is steadfast in her request for full scope of treatment and aggressive treatment. Continued support provided to patient's daughter Samuel Chavez.  SUMMARY OF RECOMMENDATIONS   Continue full scope of treatment Remain full code Continued emotional support of patient's family PMT will continue to follow  Symptom Management:  Per primary team PMT is available to assist as needed  Code Status: Full code  Prognosis: Unable to determine  Discharge Planning: To Be Determined  Discussed with: Medical team, nursing team  Thank you for allowing [place] to participate in the care of Samuel Chavez PMT will continue to support holistically.  Billing based on MDM: High  Problems  Addressed: One acute or chronic illness or injury that poses a threat to life or bodily function  Amount and/or Complexity of Data: Category 3:Discussion of management or test interpretation with external physician/other qualified health care professional/appropriate source (not separately reported)  Risks: N/A   Walden Field, NP Palliative Medicine Team  Team Phone # [phone] (Nights/Weekends)  03/19/2021, 8:17 AM

## 2022-02-20 NOTE — Progress Notes (Signed)
Recruitment Maneuver done. PCV 0/40 Rate 5 100% x 45 seconds, no drop in saturation

## 2022-02-21 ENCOUNTER — Inpatient Hospital Stay: Payer: Medicaid Other

## 2022-02-21 DIAGNOSIS — Z515 Encounter for palliative care: Secondary | ICD-10-CM | POA: Diagnosis not present

## 2022-02-21 DIAGNOSIS — I5023 Acute on chronic systolic (congestive) heart failure: Secondary | ICD-10-CM | POA: Diagnosis not present

## 2022-02-21 DIAGNOSIS — Z7189 Other specified counseling: Secondary | ICD-10-CM | POA: Diagnosis not present

## 2022-02-21 DIAGNOSIS — J9601 Acute respiratory failure with hypoxia: Secondary | ICD-10-CM | POA: Diagnosis not present

## 2022-02-21 LAB — BASIC METABOLIC PANEL
Anion gap: 7 (ref 5–15)
BUN: 143 mg/dL — ABNORMAL HIGH (ref 6–20)
CO2: 27 mmol/L (ref 22–32)
Calcium: 8.6 mg/dL — ABNORMAL LOW (ref 8.9–10.3)
Chloride: 122 mmol/L — ABNORMAL HIGH (ref 98–111)
Creatinine, Ser: 3.52 mg/dL — ABNORMAL HIGH (ref 0.61–1.24)
GFR, Estimated: 21 mL/min — ABNORMAL LOW (ref >60)
Glucose, Bld: 273 mg/dL — ABNORMAL HIGH (ref 70–99)
Potassium: 5.6 mmol/L — ABNORMAL HIGH (ref 3.5–5.1)
Sodium: 156 mmol/L — ABNORMAL HIGH (ref 135–145)

## 2022-02-21 LAB — HEPARIN LEVEL (UNFRACTIONATED): Heparin Unfractionated: 0.57 IU/mL (ref 0.30–0.70)

## 2022-02-21 LAB — CBC WITH DIFFERENTIAL/PLATELET
Abs Immature Granulocytes: 0.17 10*3/uL — ABNORMAL HIGH (ref 0.00–0.07)
Basophils Absolute: 0 10*3/uL (ref 0.0–0.1)
Basophils Relative: 0 %
Eosinophils Absolute: 0 10*3/uL (ref 0.0–0.5)
Eosinophils Relative: 0 %
HCT: 35.3 % — ABNORMAL LOW (ref 39.0–52.0)
Hemoglobin: 9.7 g/dL — ABNORMAL LOW (ref 13.0–17.0)
Immature Granulocytes: 1 %
Lymphocytes Relative: 7 %
Lymphs Abs: 1.1 10*3/uL (ref 0.7–4.0)
MCH: 26.4 pg (ref 26.0–34.0)
MCHC: 27.5 g/dL — ABNORMAL LOW (ref 30.0–36.0)
MCV: 95.9 fL (ref 80.0–100.0)
Monocytes Absolute: 1.2 10*3/uL — ABNORMAL HIGH (ref 0.1–1.0)
Monocytes Relative: 7 %
Neutro Abs: 13.7 10*3/uL — ABNORMAL HIGH (ref 1.7–7.7)
Neutrophils Relative %: 85 %
Platelets: 192 10*3/uL (ref 150–400)
RBC: 3.68 MIL/uL — ABNORMAL LOW (ref 4.22–5.81)
RDW: 15.9 % — ABNORMAL HIGH (ref 11.5–15.5)
WBC: 16.2 10*3/uL — ABNORMAL HIGH (ref 4.0–10.5)
nRBC: 0.3 % — ABNORMAL HIGH (ref 0.0–0.2)

## 2022-02-21 LAB — C-REACTIVE PROTEIN: CRP: 15.4 mg/dL — ABNORMAL HIGH (ref <1.0)

## 2022-02-21 LAB — PHOSPHORUS: Phosphorus: 5.3 mg/dL — ABNORMAL HIGH (ref 2.5–4.6)

## 2022-02-21 LAB — GLUCOSE, CAPILLARY
Glucose-Capillary: 247 mg/dL — ABNORMAL HIGH (ref 70–99)
Glucose-Capillary: 223 mg/dL — ABNORMAL HIGH (ref 70–99)
Glucose-Capillary: 202 mg/dL — ABNORMAL HIGH (ref 70–99)
Glucose-Capillary: 94 mg/dL (ref 70–99)
Glucose-Capillary: 102 mg/dL — ABNORMAL HIGH (ref 70–99)
Glucose-Capillary: 149 mg/dL — ABNORMAL HIGH (ref 70–99)

## 2022-02-21 LAB — MAGNESIUM: Magnesium: 3.2 mg/dL — ABNORMAL HIGH (ref 1.7–2.4)

## 2022-02-21 MED ORDER — FREE WATER
250.0000 mL | Status: DC
  Administered 2022-02-21 – 2022-02-25 (×49): 250 mL

## 2022-02-21 MED ORDER — TECHNETIUM TO 99M ALBUMIN AGGREGATED
4.3500 | Freq: Once | INTRAVENOUS | Status: AC | PRN
  Administered 2022-02-21: 4.35 via INTRAVENOUS

## 2022-02-21 MED ORDER — DEXAMETHASONE SODIUM PHOSPHATE 4 MG/ML IJ SOLN
4.0000 mg | Freq: Two times a day (BID) | INTRAMUSCULAR | Status: DC
  Administered 2022-02-21 – 2022-02-22 (×3): 4 mg via INTRAVENOUS
  Filled 2022-02-21 (×3): qty 1

## 2022-02-21 MED ORDER — VECURONIUM BROMIDE 10 MG IV SOLR
10.0000 mg | Freq: Once | INTRAVENOUS | Status: AC
  Administered 2022-02-21: 10 mg via INTRAVENOUS
  Filled 2022-02-21: qty 10

## 2022-02-21 MED ORDER — PATIROMER SORBITEX CALCIUM 8.4 G PO PACK
16.8000 g | PACK | Freq: Every day | ORAL | Status: DC
  Administered 2022-02-21 – 2022-02-22 (×2): 16.8 g via ORAL
  Filled 2022-02-21 (×3): qty 2

## 2022-02-21 MED ORDER — VECURONIUM BROMIDE 10 MG IV SOLR
10.0000 mg | INTRAVENOUS | Status: DC | PRN
  Filled 2022-02-21: qty 10

## 2022-02-21 MED ORDER — HEPARIN SODIUM (PORCINE) 5000 UNIT/ML IJ SOLN
5000.0000 [IU] | Freq: Three times a day (TID) | INTRAMUSCULAR | Status: AC
Start: 2022-02-21 — End: 2022-02-24
  Administered 2022-02-21 – 2022-02-24 (×10): 5000 [IU] via SUBCUTANEOUS
  Filled 2022-02-21 (×10): qty 1

## 2022-02-21 MED ORDER — INSULIN ASPART 100 UNIT/ML IJ SOLN
10.0000 [IU] | Freq: Once | INTRAMUSCULAR | Status: AC
  Administered 2022-02-21: 10 [IU] via INTRAVENOUS
  Filled 2022-02-21: qty 1
  Filled 2022-02-21: qty 0.1

## 2022-02-21 MED ORDER — DEXTROSE 50 % IV SOLN
12.5000 g | Freq: Once | INTRAVENOUS | Status: AC
  Administered 2022-02-21: 12.5 g via INTRAVENOUS
  Filled 2022-02-21: qty 50

## 2022-02-21 MED ORDER — SODIUM ZIRCONIUM CYCLOSILICATE 5 G PO PACK
10.0000 g | PACK | Freq: Two times a day (BID) | ORAL | Status: DC
Start: 2022-02-21 — End: 2022-02-21
  Administered 2022-02-21: 10 g
  Filled 2022-02-21: qty 2

## 2022-02-21 NOTE — Progress Notes (Signed)
Tolerated recruitment maneuvers well

## 2022-02-21 NOTE — Progress Notes (Addendum)
[place], [place] 02/21/22  Subjective:   Hospital day # 10  [name] is a 46 y.o.  male with past medical history of diabetes, hypertension, sCHF, OSA, and obesity, who was admitted to [facility] on 02/11/2022 for Acute respiratory failure ([place]) [J96.00] Respiratory failure ([place]) [J96.90] Peripheral edema [R60.9] Acute respiratory failure with hypoxia and hypercapnia ([place]) [J96.01, J96.02]  We have been re-consulted for elevated renal function. We were consulted during the initial admission for acute kidney injury which was corrected with IV Furosemide. Creatinine began to increase on 02/18/22. No exposure to IV contrast, hypotension, or nephrotoxic medications. Urine output remains adequate, 2.6L. No edema.   Sedated on Fentanyl and versed No pressors Vent- weaned to 60 % FiO2  Heparin drip Tube feeds remain 26ml/hr Foley and flexiseal placed Cooling blanket in use No edema  Renal: 08/03 0701 - 08/04 0700 In: 3754.9 [I.V.:1604.9; NG/GT:1950; IV Piggyback:200] Out: 3575 [Urine:3575] Lab Results  Component Value Date   CREATININE 3.52 (H) 02/21/2022   CREATININE 3.94 (H) 02/20/2022   CREATININE 2.61 (H) 02/19/2022     Objective:  Vital signs in last 24 hours:  Temp:  [98.1 F (36.7 C)-100.2 F (37.9 C)] 99.9 F (37.7 C) (08/04 0800) Pulse Rate:  [90-114] 90 (08/04 1000) Resp:  [24-30] 30 (08/04 1000) BP: (114-130)/(67-77) 120/72 (08/04 1000) SpO2:  [88 %-100 %] 94 % (08/04 1000) Arterial Line BP: (114-134)/(63-74) 123/70 (08/04 0900) FiO2 (%):  [60 %-75 %] 60 % (08/04 0800) Weight:  [210 kg] 210 kg (08/04 0320)  Weight change: 2 kg Filed Weights   02/19/22 0400 02/20/22 0420 02/21/22 0320  Weight: (!) 197 kg (!) 208 kg (!) 210 kg    Intake/Output:    Intake/Output Summary (Last 24 hours) at 02/21/2022 1248 Last data filed at 02/21/2022 1000 Gross per 24 hour  Intake 2029.78 ml  Output 2775 ml  Net -745.22 ml       Physical Exam: General: Critically ill-appearing, laying in the bed  HEENT ET tube, OG tube in place  Pulm/lungs Ventilator assisted, coarse breath sounds  CVS/Heart Regular  Abdomen:  Soft, nontender, nondistended  Extremities: Trace dependent edema  Neurologic: Sedated  Skin: Warm, dry  Access:        Basic Metabolic Panel:  Recent Labs  Lab 02/17/22 0305 02/18/22 0326 02/18/22 1331 02/19/22 0412 02/19/22 0920 02/19/22 1337 02/20/22 0359 02/21/22 0354  NA 145 151* 151* 151*  --   --  151* 156*  K 3.9 5.0 5.3* 5.7* 5.5* 5.8* 5.4* 5.6*  CL 112* 111 114* 113*  --   --  117* 122*  CO2 27 32 31 30  --   --  27 27  GLUCOSE 181* 177* 222* 246*  --   --  322* 273*  BUN 36* 72* 72* 94*  --   --  125* 143*  CREATININE 0.84 1.88* 1.64* 2.61*  --   --  3.94* 3.52*  CALCIUM 6.8* 8.3* 8.4* 8.2*  --   --  8.4* 8.6*  MG 2.1 2.7*  --  2.9*  --   --  3.3* 3.2*  PHOS 4.6 4.1  --  3.4  --   --  4.8* 5.3*      CBC: Recent Labs  Lab 02/17/22 0305 02/18/22 0326 02/19/22 0412 02/20/22 0359 02/21/22 0354  WBC 14.6* 15.3* 12.3* 16.6* 16.2*  NEUTROABS 11.6* 11.9* 10.3* 14.0* 13.7*  HGB 9.5* 10.8* 10.1* 9.7* 9.7*  HCT 34.7* 39.8 37.4*  34.6* 35.3*  MCV 98.6 96.8 96.6 95.8 95.9  PLT 134* 172 181 192 192      No results found for: "HEPBSAG", "HEPBSAB", "HEPBIGM"    Microbiology:  Recent Results (from the past 240 hour(s))  SARS Coronavirus 2 by RT PCR (hospital order, performed in [facility] hospital lab) *cepheid single result test* Anterior Nasal Swab     Status: None   Collection Time: 02/11/22  4:33 PM   Specimen: Anterior Nasal Swab  Result Value Ref Range Status   SARS Coronavirus 2 by RT PCR NEGATIVE NEGATIVE Final    Comment: (NOTE) SARS-CoV-2 target nucleic acids are NOT DETECTED.  The SARS-CoV-2 RNA is generally detectable in upper and lower respiratory specimens during the acute phase of infection. The lowest concentration of SARS-CoV-2 viral copies  this assay can detect is 250 copies / mL. A negative result does not preclude SARS-CoV-2 infection and should not be used as the sole basis for treatment or other patient management decisions.  A negative result may occur with improper specimen collection / handling, submission of specimen other than nasopharyngeal swab, presence of viral mutation(s) within the areas targeted by this assay, and inadequate number of viral copies (<250 copies / mL). A negative result must be combined with clinical observations, patient history, and epidemiological information.  Fact Sheet for Patients:   https://www.patel.info/  Fact Sheet for Healthcare Providers: https://hall.com/  This test is not yet approved or  cleared by the [place] FDA and has been authorized for detection and/or diagnosis of SARS-CoV-2 by FDA under an Emergency Use Authorization (EUA).  This EUA will remain in effect (meaning this test can be used) for the duration of the COVID-19 declaration under Section 564(b)(1) of the Act, 21 U.S.C. section 360bbb-3(b)(1), unless the authorization is terminated or revoked sooner.  Performed at [facility], [address] [place], [place] [address]   MRSA Next Gen by PCR, Nasal     Status: None   Collection Time: 02/11/22  5:48 PM   Specimen: Nasal Mucosa; Nasal Swab  Result Value Ref Range Status   MRSA by PCR Next Gen NOT DETECTED NOT DETECTED Final    Comment: (NOTE) The GeneXpert MRSA Assay (FDA approved for NASAL specimens only), is one component of a comprehensive MRSA colonization surveillance program. It is not intended to diagnose MRSA infection nor to guide or monitor treatment for MRSA infections. Test performance is not FDA approved in patients less than 17 years old. Performed at [facility], [address] [place], [place] [address]   Culture, Respiratory w Gram Stain     Status: None    Collection Time: 02/13/22  9:16 PM   Specimen: Tracheal Aspirate; Respiratory  Result Value Ref Range Status   Specimen Description   Final    TRACHEAL ASPIRATE Performed at [facility], [address] [place], [place] [address]    Special Requests   Final    NONE Performed at [facility], [address] [place], [place] [address]    Gram Stain   Final    NO SQUAMOUS EPITHELIAL CELLS SEEN FEW WBC SEEN FEW GRAM NEGATIVE RODS MODERATE GRAM POSITIVE COCCI    Culture   Final    ABUNDANT STREPTOCOCCUS PNEUMONIAE ABUNDANT HAEMOPHILUS INFLUENZAE BETA LACTAMASE NEGATIVE NO STAPHYLOCOCCUS AUREUS ISOLATED No Pseudomonas species isolated Performed at [facility], [place] [address], [place], [place] [address]    Report Status 02/17/2022 FINAL  Final   Organism ID,  Bacteria STREPTOCOCCUS PNEUMONIAE  Final      Susceptibility   Streptococcus pneumoniae - MIC*    ERYTHROMYCIN <=0.12 SENSITIVE Sensitive     LEVOFLOXACIN 0.5 SENSITIVE Sensitive     VANCOMYCIN 0.5 SENSITIVE Sensitive     PENICILLIN (meningitis) <=0.06 SENSITIVE Sensitive     PENO - penicillin <=0.06      PENICILLIN (non-meningitis) <=0.06 SENSITIVE Sensitive     PENICILLIN (oral) <=0.06 SENSITIVE Sensitive     CEFTRIAXONE (non-meningitis) <=0.12 SENSITIVE Sensitive     CEFTRIAXONE (meningitis) <=0.12 SENSITIVE Sensitive     * ABUNDANT STREPTOCOCCUS PNEUMONIAE  Culture, Respiratory w Gram Stain     Status: None   Collection Time: 02/16/22  4:27 PM   Specimen: Tracheal Aspirate; Respiratory  Result Value Ref Range Status   Specimen Description   Final    TRACHEAL ASPIRATE Performed at [facility], [address] [place], [place] [address]    Special Requests   Final    NONE Performed at [facility], [address] [place], [place] [address]    Gram Stain   Final    RARE WBC PRESENT,BOTH PMN AND MONONUCLEAR NO ORGANISMS SEEN    Culture   Final    NO  GROWTH Performed at [facility], [place] [address], [place], [place] [address]    Report Status 02/20/2022 FINAL  Final    Coagulation Studies: No results for input(s): "LABPROT", "INR" in the last 72 hours.  Urinalysis: No results for input(s): "COLORURINE", "LABSPEC", "PHURINE", "GLUCOSEU", "HGBUR", "BILIRUBINUR", "KETONESUR", "PROTEINUR", "UROBILINOGEN", "NITRITE", "LEUKOCYTESUR" in the last 72 hours.  Invalid input(s): "APPERANCEUR"    Imaging: DG Chest Port 1 View  Result Date: 02/21/2022 CLINICAL DATA:  Acute respiratory failure with hypoxia EXAM: PORTABLE CHEST 1 VIEW COMPARISON:  Yesterday FINDINGS: Endotracheal tube tip appears to project just at the lower margin of the clavicular heads. An enteric tube reaches the stomach. Right PICC with tip at distal SVC. Unchanged cardiomegaly and diffuse pulmonary opacity. No visible effusion or pneumothorax IMPRESSION: 1. Unremarkable hardware positioning. 2. Unchanged diffuse airspace disease. Electronically Signed   By: Jorje Guild M.D.   On: 02/21/2022 06:14   DG Chest Port 1 View  Result Date: 02/20/2022 CLINICAL DATA:  Acute on chronic respiratory failure EXAM: PORTABLE CHEST 1 VIEW COMPARISON:  Yesterday FINDINGS: Cardiomegaly and diffuse pulmonary opacity. Improved aeration of the left upper lobe compared to prior. Endotracheal tube with tip at the clavicular heads. The enteric tube reaches the stomach. Right PICC with tip at the SVC. IMPRESSION: 1. Unremarkable hardware positioning. 2. Bilateral airspace disease with interval reinflation of the left upper lobe. Electronically Signed   By: Jorje Guild M.D.   On: 02/20/2022 05:31     Medications:    sodium chloride 250 mL (02/19/22 1011)   sodium chloride Stopped (02/11/22 2011)   cefTRIAXone (ROCEPHIN)  IV 2 g (02/21/22 0832)   feeding supplement (VITAL 1.5 CAL) 45 mL/hr at 02/21/22 0400   fentaNYL infusion INTRAVENOUS 200 mcg/hr (02/21/22 0400)   heparin 2,000 Units/hr  (02/21/22 0812)   midazolam 6 mg/hr (02/21/22 0400)    artificial tears  1 Application Both Eyes C1E   budesonide (PULMICORT) nebulizer solution  0.5 mg Nebulization BID   Chlorhexidine Gluconate Cloth  6 each Topical Q0600   dexamethasone (DECADRON) injection  4 mg Intravenous Q12H   feeding supplement (PROSource TF)  90 mL Per Tube 5 X Daily   free water  250 mL Per Tube Q2H  insulin aspart  0-20 Units Subcutaneous Q4H   insulin aspart  8 Units Subcutaneous Q4H   insulin glargine-yfgn  20 Units Subcutaneous BID   insulin starter kit- pen needles  1 kit Other Once   ipratropium-albuterol  3 mL Nebulization Q6H   living well with diabetes book   Does not apply Once   multivitamin  1 tablet Per Tube QHS   mouth rinse  15 mL Mouth Rinse Q2H   oxyCODONE  10 mg Per Tube Q6H   pantoprazole sodium  40 mg Per Tube QHS   phenobarbital  97.2 mg Per Tube TID   polyethylene glycol  17 g Per Tube Daily   senna-docusate  1 tablet Per Tube BID   sodium chloride flush  10-40 mL Intracatheter Q12H   sodium chloride flush  3 mL Intravenous Q12H   sodium zirconium cyclosilicate  10 g Per Tube BID   vecuronium  10 mg Intravenous Once   sodium chloride, acetaminophen **OR** acetaminophen, docusate sodium, fentaNYL, midazolam, ondansetron **OR** ondansetron (ZOFRAN) IV, mouth rinse, polyethylene glycol, sodium chloride flush, sodium chloride flush, traZODone, vecuronium  Assessment/ Plan:  46 y.o. male with medical problems of poorly controlled diabetes, hypertension, systolic CHF, obstructive sleep apnea, obesity    admitted on 02/11/2022 for Acute respiratory failure ([place]) [J96.00] Respiratory failure ([place]) [J96.90] Peripheral edema [R60.9] Acute respiratory failure with hypoxia and hypercapnia ([facility]) [J96.01, J96.02]  #Acute kidney injury with hyperkalemia/hypernatremia.  Baseline creatinine 0.96 from February 11, 2022. Suspected secondary to fever, hypotension and sepsis. No IV contrast exposure or  nephrotoxic agents. CK 663,   Potassium elevated to 5.6 with elevated sodium, 156. Suggest increasing flushes to 236ml every 2 hours to manage sodium. Continue to lokelma for potassium. Patient made great urine output, 3.5L Will hold dialysis for now and continue to monitor.    #Acute respiratory failure Ventilator assisted.  Management as per ICU team.  Weaning continues to 60% and bronch scheduled today.  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     LOS: [place] 8/4/202312:48 PM  [facility], [place]

## 2022-02-21 NOTE — Progress Notes (Signed)
Patient tolerated recruitment maneuver well.

## 2022-02-21 NOTE — Consult Note (Signed)
ANTICOAGULATION CONSULT NOTE  Pharmacy Consult for IV Heparin Indication: pulmonary embolus (rule-out / work-up pending)  Patient Measurements: Height: 5' 9.02" (175.3 cm) Weight: (!) 210 kg (462 lb 15.5 oz) IBW/kg (Calculated) : 70.74 Heparin Dosing Weight: 121 kg  Labs: Recent Labs    02/19/22 0412 02/19/22 0920 02/19/22 2124 02/20/22 0359 02/21/22 0354  HGB 10.1*  --   --  9.7* 9.7*  HCT 37.4*  --   --  34.6* 35.3*  PLT 181  --   --  192 192  HEPARINUNFRC  --   --  0.44 0.49 0.57  HEPRLOWMOCWT  --  0.27  --   --   --   CREATININE 2.61*  --   --  3.94* 3.52*  CKTOTAL 663*  --   --   --   --      Estimated Creatinine Clearance: 47.9 mL/min (A) (by C-G formula based on SCr of 3.52 mg/dL (H)).   Medical History: Past Medical History:  Diagnosis Date   Diabetes mellitus without complication ([place])    Hypertension    Pancreatitis     Medications:  --No anticoagulation prior to admission per my chart review --Patient was on therapeutic IV heparin from 7/29 thru 7/31  --Patient was on therapeutic Lovenox from 7/31 thru 8/2  Assessment: Patient is a 46 y/o M with medical history including DM, HTN, pancreatitis, tobacco use disorder, systolic CHF, OSA, morbid obesity who is admitted with severe respiratory failure requiring mechanical ventilatory support. There is concern for pulmonary embolism. Work-up is still pending. Pharmacy consulted to re-initiate IV heparin from therapeutic Lovenox partly secondary to AKI / concern for reduced clearance.   Patient was previously on IV heparin this admission and was therapeutic at 2000 units/hr  Goal of Therapy:  Heparin level 0.3-0.7 units/ml Monitor platelets by anticoagulation protocol: Yes   Plan:  8/4:  HL @ 0354 = 0.57, therapeutic X 3 Will continue pt on current rate and recheck HL on 8/5 with AM labs.   Milon Dethloff D 02/21/2022,5:14 AM

## 2022-02-21 NOTE — Inpatient Diabetes Management (Signed)
Inpatient Diabetes Program Recommendations  AACE/ADA: New Consensus Statement on Inpatient Glycemic Control   Target Ranges:  Prepandial:   less than 140 mg/dL      Peak postprandial:   less than 180 mg/dL (1-2 hours)      Critically ill patients:  140 - 180 mg/dL    Latest Reference Range & Units 02/21/22 03:13  Glucose-Capillary 70 - 99 mg/dL 247 (H)    Latest Reference Range & Units 02/20/22 07:36 02/20/22 11:05 02/20/22 15:12 02/20/22 18:56 02/20/22 19:19 02/20/22 23:24  Glucose-Capillary 70 - 99 mg/dL 168 (H) 179 (H) 204 (H) 256 (H) 253 (H) 264 (H)   Review of Glycemic Control  Current orders for Inpatient glycemic control: Semglee 20 units BID, Novolog 8 units Q4H, Novolog 0-20 units Q4H; Decadron 8 mg Q8H, Vital @ 45 ml/hr   Inpatient Diabetes Program Recommendations:     Insulin: If steroids are continued as ordered, please consider increasing Semglee to 30 units BID and tube feeding coverage to Novolog 10 units Q4H.  If orders are changed as recommended and glucose continues to be consistently over 180 mg/dl, would recommend changing to ICU Glycemic Control order set Phase 2 IV insulin.    Thanks, Barnie Alderman, RN, MSN, [place] Diabetes Coordinator Inpatient Diabetes Program [phone] (Team Pager from 8am to [place])

## 2022-02-21 NOTE — Procedures (Signed)
PROCEDURE: BRONCHOSCOPY Therapeutic Aspiration of Tracheobronchial Tree  PROCEDURE DATE: 02/21/2022  TIME:  NAME:  Samuel Chavez  DOB:October 09, 1975  MRN: [id number] LOC:  [facility]11A/[facility]11A-AA    HOSP DAY: @LENGTHOFSTAYDAYS @ CODE STATUS:      Code Status Orders  (From admission, onward)           Start     Ordered   02/11/22 1632  Full code  Continuous        02/11/22 1633           Code Status History     Date Active Date Inactive Code Status Order ID Comments User Context   02/11/2022 1529 02/11/2022 1633 Full Code [id number]  Mansy, Arvella Merles, MD ED           Indications/Preliminary Diagnosis:   Consent: (Place X beside choice/s below)  The benefits, risks and possible complications of the procedure were        explained to:  ___ patient  __x_ patient's family  ___ other:___________  who verbalized understanding and gave:  ___ verbal  __x_ written  ___ verbal and written  ___ telephone  ___ other:________ consent.      Unable to obtain consent; procedure performed on emergent basis.     Other:       PRESEDATION ASSESSMENT: History and Physical has been performed. Patient meds and allergies have been reviewed. Presedation airway examination has been performed and documented. Baseline vital signs, sedation score, oxygenation status, and cardiac rhythm were reviewed. Patient was deemed to be in satisfactory condition to undergo the procedure.    PREMEDICATIONS:   Sedative/Narcotic Amt Dose   Versed gtt mg   Fentanyl gtt mcg  vecuronium 10 mg            PROCEDURE DETAILS: Timeout performed and correct patient, name, & ID confirmed. Following prep per Pulmonary policy, appropriate sedation was administered. The Bronchoscope was inserted in to oral cavity with bite block in place. Therapeutic aspiration of Tracheobronchial tree was performed.  Airway exam proceeded with findings, technical procedures, and specimen collection as noted below. At the end of exam the  scope was withdrawn without incident. Impression and Plan as noted below.           Airway Prep (Place X beside choice below)   1% Transtracheal Lidocaine Anesthetization 7 cc   Patient prepped per Bronchoscopy Lab Policy       Insertion Route (Place X beside choice below)   Nasal   Oral  x Endotracheal Tube   Tracheostomy     Medication Amt Dose  Medication Amt Dose  Lidocaine 1% 0 cc  Epinephrine 1:10,000 sol  cc  Xylocaine 4% 0 cc  Cocaine  cc   TECHNICAL PROCEDURES: (Place X beside choice below)   Procedures  Description    None     Electrocautery     Cryotherapy     Balloon Dilatation     Bronchography     Stent Placement   x  Therapeutic Aspiration At LLL, RLL, RML    Laser/Argon Plasma    Brachytherapy Catheter Placement    Foreign Body Removal         SPECIMENS (Sites): (Place X beside choice below)  Specimens Description   No Specimens Obtained     [place]    Lavage    Biopsies    Fine Needle Aspirates    Brushings    Sputum    FINDINGS:  ESTIMATED BLOOD LOSS: none COMPLICATIONS/RESOLUTION: none  IMPRESSION:POST-PROCEDURE DX:   Await cultures from microbiology   Ottie Glazier, M.D.  [facility] [place]

## 2022-02-21 NOTE — Consult Note (Signed)
Samuel Chavez, Samuel Chavez [id number] Feb 10, 1976 [name], MD  Reason for Consult: Tracheostomy Requesting Physician: Ottie Glazier, MD Consulting Physician: [name], MD  HPI: This 46 y.o. year old male was admitted on 02/11/2022 for Acute respiratory failure ([place]) [J96.00] Respiratory failure ([name]) [J96.90] Peripheral edema [R60.9] Acute respiratory failure with hypoxia and hypercapnia ([name]) [J96.01, J96.02]. The patient is a 46 year old male with a previous history significant of hypertension, HFrEF, and diabetes mellitus type 2 who presented to [facility] with hypoxic respiratory failure.  Initially the patient was started on BiPAP and he ultimately required intubation.  Despite this he still continues to have significant persistent hypoxia.  Bilateral lower extremity Dopplers were done and no DVT was found. Hospital summary:  02/17/22- patient is severely critically ill with bilateral multifocal pneumonia on sedation and paralysis with mechanical ventilation maximal settings.  He is at cusp of death, team met with daughter today and discussed severity of illness. Unable to perform CT due to severity of critical illness. Met with previous PCCM doc and discussed medical plan.  Patient had recruitment maneuvers last few days without improvement, on heparin gtt for possible PE.   02/18/22- patient continues to require maximal settings on ventilator despite good UOP.  He destaturated to <50% spO2 on MV today required BGV.   02/19/22- patient continues to require 100% FiO2 on maximal setting with high PEEP ladder for possible ARDS.  He was diuresed and on steroids with development of AKI.  His CXR had slight interval improvement on right.  02/20/22- patient was unable to get CT today due to continued severe hypoxemia.  Daughter and other family members at bedside and team reviewed case and medical plan.   02/21/22- patient is weaned to 60%FiO2.  Plan for possible bronch if patient is  tolerating.  Family at bedside.  If able to will obtain more imaging and VQ scan to rule out PE.  Medications:  Current Facility-Administered Medications  Medication Dose Route Frequency Provider Last Rate Last Admin   0.9 %  sodium chloride infusion  250 mL Intravenous PRN Flora Lipps, MD 10 mL/hr at 02/19/22 1011 250 mL at 02/19/22 1011   0.9 %  sodium chloride infusion  250 mL Intravenous Continuous Lang Snow, NP   Held at 02/11/22 2011   acetaminophen (TYLENOL) tablet 650 mg  650 mg Oral Q6H PRN Mansy, Jan A, MD   650 mg at 02/20/22 4098   Or   acetaminophen (TYLENOL) suppository 650 mg  650 mg Rectal Q6H PRN Mansy, Jan A, MD       artificial tears (LACRILUBE) ophthalmic ointment 1 Application  1 Application Both Eyes J1B Rust-Chester, Britton L, NP   1 Application at 14/78/29 1410   budesonide (PULMICORT) nebulizer solution 0.5 mg  0.5 mg Nebulization BID Mortimer Fries, Kurian, MD   0.5 mg at 02/21/22 0725   cefTRIAXone (ROCEPHIN) 2 g in sodium chloride 0.9 % 100 mL IVPB  2 g Intravenous Q12H Ottie Glazier, MD 200 mL/hr at 02/21/22 0832 2 g at 02/21/22 5621   Chlorhexidine Gluconate Cloth 2 % PADS 6 each  6 each Topical Q0600 Flora Lipps, MD   6 each at 02/20/22 2215   dexamethasone (DECADRON) injection 4 mg  4 mg Intravenous Q12H Ottie Glazier, MD       docusate sodium (COLACE) capsule 100 mg  100 mg Oral BID PRN Flora Lipps, MD       feeding supplement (PROSource TF) liquid 90 mL  90 mL  Per Tube 5 X Daily Rust-Chester, Britton L, NP   90 mL at 02/21/22 1320   feeding supplement (VITAL 1.5 CAL) liquid 1,000 mL  1,000 mL Per Tube Continuous Tyler Pita, MD 45 mL/hr at 02/21/22 0400 Infusion Verify at 02/21/22 0400   fentaNYL (SUBLIMAZE) bolus via infusion 50-100 mcg  50-100 mcg Intravenous Q15 min PRN Flora Lipps, MD   100 mcg at 02/15/22 0350   fentaNYL 2560mcg in NS 289mL (54mcg/ml) infusion-PREMIX  50-300 mcg/hr Intravenous Continuous Rust-Chester, Britton L, NP 20 mL/hr  at 02/21/22 0400 200 mcg/hr at 02/21/22 0400   free water 250 mL  250 mL Per Tube Q2H Darel Hong D, NP   250 mL at 02/21/22 1530   heparin ADULT infusion 100 units/mL (25000 units/289mL)  2,000 Units/hr Intravenous Continuous Benita Gutter, RPH 20 mL/hr at 02/21/22 0812 2,000 Units/hr at 02/21/22 7092   insulin aspart (novoLOG) injection 0-20 Units  0-20 Units Subcutaneous Q4H Lang Snow, NP   7 Units at 02/21/22 1114   insulin aspart (novoLOG) injection 8 Units  8 Units Subcutaneous Q4H Rust-Chester, Britton L, NP   8 Units at 02/21/22 1330   insulin glargine-yfgn (SEMGLEE) injection 20 Units  20 Units Subcutaneous BID Lang Snow, NP   20 Units at 02/21/22 0920   insulin starter kit- pen needles (English) 1 kit  1 kit Other Once Flora Lipps, MD       ipratropium-albuterol (DUONEB) 0.5-2.5 (3) MG/3ML nebulizer solution 3 mL  3 mL Nebulization Q6H Flora Lipps, MD   3 mL at 02/21/22 1325   living well with diabetes book MISC   Does not apply Once Flora Lipps, MD       midazolam (VERSED) 100 mg/100 mL (1 mg/mL) premix infusion  2-10 mg/hr Intravenous Continuous Rust-Chester, Britton L, NP 6 mL/hr at 02/21/22 0400 6 mg/hr at 02/21/22 0400   midazolam (VERSED) bolus via infusion 2-4 mg  2-4 mg Intravenous Q2H PRN Rust-Chester, Huel Cote, NP   4 mg at 02/16/22 0418   multivitamin (RENA-VIT) tablet 1 tablet  1 tablet Per Tube QHS Ottie Glazier, MD   1 tablet at 02/20/22 2125   ondansetron (ZOFRAN) tablet 4 mg  4 mg Oral Q6H PRN Mansy, Jan A, MD       Or   ondansetron [facility]) injection 4 mg  4 mg Intravenous Q6H PRN Mansy, Jan A, MD       Oral care mouth rinse  15 mL Mouth Rinse Q2H Flora Lipps, MD   15 mL at 02/21/22 1644   Oral care mouth rinse  15 mL Mouth Rinse PRN Flora Lipps, MD       oxyCODONE (Oxy IR/ROXICODONE) immediate release tablet 10 mg  10 mg Per Tube Q6H Flora Lipps, MD   10 mg at 02/21/22 1644   pantoprazole sodium (PROTONIX) 40 mg/20 mL oral  suspension 40 mg  40 mg Per Tube QHS Benita Gutter, RPH   40 mg at 02/20/22 2125   patiromer (VELTASSA) packet 16.8 g  16.8 g Oral Daily Murlean Iba, MD       PHENobarbital (LUMINAL) tablet 97.2 mg  97.2 mg Per Tube TID Ottie Glazier, MD   97.2 mg at 02/21/22 1643   polyethylene glycol (MIRALAX / GLYCOLAX) packet 17 g  17 g Per Tube Daily Delena Bali, RPH   17 g at 02/20/22 1046   polyethylene glycol (MIRALAX / GLYCOLAX) packet 17 g  17 g Per Tube Daily PRN  Orson Aloe, RPH       senna-docusate (Senokot-S) tablet 1 tablet  1 tablet Per Tube BID Ezequiel Essex, NP   1 tablet at 02/20/22 1045   sodium chloride flush (NS) 0.9 % injection 10-40 mL  10-40 mL Intracatheter Q12H Kasa, Wallis Bamberg, MD   10 mL at 02/21/22 0827   sodium chloride flush (NS) 0.9 % injection 10-40 mL  10-40 mL Intracatheter PRN Erin Fulling, MD   40 mL at 02/15/22 0907   sodium chloride flush (NS) 0.9 % injection 3 mL  3 mL Intravenous Q12H Erin Fulling, MD   3 mL at 02/21/22 0828   sodium chloride flush (NS) 0.9 % injection 3 mL  3 mL Intravenous PRN Erin Fulling, MD       traZODone (DESYREL) tablet 25 mg  25 mg Per NG tube QHS PRN Selinda Eon, RPH       vecuronium (NORCURON) injection 10 mg  10 mg Intravenous Q2H PRN Judithe Modest, NP      .  Medications Prior to Admission  Medication Sig Dispense Refill   losartan (COZAAR) 100 MG tablet Take 100 mg by mouth daily.     OZEMPIC, 0.25 OR 0.5 MG/DOSE, 2 MG/3ML SOPN SMARTSIG:0.25 Milligram(s) SUB-Q Once a Week     torsemide (DEMADEX) 20 MG tablet Take 20 mg by mouth daily.     Aspirin-Salicylamide-Caffeine (BC HEADACHE POWDER PO) Take 1 packet by mouth as needed (for pain).     cephALEXin (KEFLEX) 500 MG capsule Take 1 capsule (500 mg total) by mouth 4 (four) times daily. (Patient not taking: Reported on 02/11/2022) 20 capsule 0   cyclobenzaprine (FLEXERIL) 10 MG tablet Take 0.5-1 tablets (5-10 mg total) by mouth 2 (two) times daily as needed for  muscle spasms. (Patient not taking: Reported on 02/11/2022) 20 tablet 0   gabapentin (NEURONTIN) 600 MG tablet Take 600 mg by mouth at bedtime. (Patient not taking: Reported on 02/11/2022)     LABETALOL HCL PO Take by mouth. (Patient not taking: Reported on 02/11/2022)     meloxicam (MOBIC) 15 MG tablet Take 1 tablet (15 mg total) by mouth daily. Take 1 daily with food. (Patient not taking: Reported on 02/11/2022) 10 tablet 0   metFORMIN (GLUCOPHAGE) 500 MG tablet Take 500 mg by mouth 2 (two) times daily with a meal. (Patient not taking: Reported on 02/11/2022)     METFORMIN HCL ER, MOD, PO Take by mouth. (Patient not taking: Reported on 02/11/2022)     METOPROLOL SUCCINATE ER PO Take by mouth. (Patient not taking: Reported on 02/11/2022)     oxyCODONE-acetaminophen (PERCOCET) 10-325 MG tablet Take 1 tablet by mouth See admin instructions. Take 1 tablet by mouth 4-5 times a day as needed for pain (Patient not taking: Reported on 02/11/2022)     oxyCODONE-acetaminophen (PERCOCET/ROXICET) 5-325 MG tablet Take 1 tablet by mouth every 8 (eight) hours as needed for severe pain. (Patient not taking: Reported on 02/11/2022) 4 tablet 0   predniSONE (DELTASONE) 20 MG tablet Take 2 tablets (40 mg total) by mouth daily. (Patient not taking: Reported on 02/11/2022) 6 tablet 0    Allergies:  Allergies  Allergen Reactions   Hydrocodone Swelling    PMH:  Past Medical History:  Diagnosis Date   Diabetes mellitus without complication (HCC)    Hypertension    Pancreatitis     Fam Hx: History reviewed. No pertinent family history.  Soc Hx:  Social History   Socioeconomic History  Marital status: Single    Spouse name: Not on file   Number of children: Not on file   Years of education: Not on file   Highest education level: Not on file  Occupational History   Not on file  Tobacco Use   Smoking status: Every Day    Packs/day: 1.00    Types: Cigarettes   Smokeless tobacco: Never  Substance and Sexual  Activity   Alcohol use: No   Drug use: No   Sexual activity: Not on file  Other Topics Concern   Not on file  Social History Narrative   Not on file   Social Determinants of Health   Financial Resource Strain: Not on file  Food Insecurity: Not on file  Transportation Needs: Not on file  Physical Activity: Not on file  Stress: Not on file  Social Connections: Not on file  Intimate Partner Violence: Not on file    PSH:  Past Surgical History:  Procedure Laterality Date   CHOLECYSTECTOMY    . Procedures since admission: No admission procedures for hospital encounter.  ROS: Review of systems normal other than 12 systems except per HPI.  PHYSICAL EXAM  Vitals: Blood pressure 117/76, pulse 91, temperature 99.9 F (37.7 C), temperature source Esophageal, resp. rate (!) 30, height 5' 9.02" (1.753 m), weight (!) 210 kg, SpO2 96 %.. General: Obese patient on vent and sedated Mood: Unable to assess. Patient on vent and sedated Orientation: Patient on vent and sedated. Vocal Quality: Unable to assess. Patient intubated, on vent and sedated Head and Face: NCAT. No facial asymmetry. No visible skin lesions. No significant facial scars. No tenderness with sinus percussion. Facial strength normal and symmetric. Ears: External ears with normal landmarks, no lesions. External auditory canals free of infection, cerumen impaction or lesions. Tympanic membranes intact with good landmarks and normal mobility on pneumatic otoscopy. No middle ear effusion. Hearing: Unable to assess. Patient on vent and sedated Nose: External nose normal with midline dorsum and no lesions or deformity. Nasal Cavity reveals essentially midline septum with normal inferior turbinates. No significant mucosal congestion or erythema. Nasal secretions are minimal and clear. No polyps seen on anterior rhinoscopy. Oral Cavity/ Oropharynx: Lips are normal with no lesions. Exam is limited due to presence of endotracheal tube.  Tongue is prominent, no gross sign of infection but some edema.  Indirect Laryngoscopy/Nasopharyngoscopy: Visualization of the larynx, hypopharynx and nasopharynx is not possible in this setting with routine examination. Neck: Supple and symmetric with no palpable masses, tenderness or crepitance. The trachea is midline. Thyroid gland is soft, nontender and symmetric with no masses or enlargement. Parotid and submandibular glands are soft, nontender and symmetric, without masses. Lymphatic: Cervical lymph nodes are without palpable lymphadenopathy or tenderness. Respiratory: Patient on ventilator for respiratory support Cardiovascular: Carotid pulse shows regular rate and rhythm Neurologic: Unable to assess. Patient on vent and sedated Eyes: Unable to assess. Patient on vent and sedated  MEDICAL DECISION MAKING: Data Review:  Results for orders placed or performed during the hospital encounter of 02/11/22 (from the past 48 hour(s))  Glucose, capillary     Status: Abnormal   Collection Time: 02/19/22  7:08 PM  Result Value Ref Range   Glucose-Capillary 290 (H) 70 - 99 mg/dL    Comment: Glucose reference range applies only to samples taken after fasting for at least 8 hours.  Heparin level (unfractionated)     Status: None   Collection Time: 02/19/22  9:24 PM  Result Value Ref  Range   Heparin Unfractionated 0.44 0.30 - 0.70 IU/mL    Comment: (NOTE) The clinical reportable range upper limit is being lowered to >1.10 to align with the FDA approved guidance for the current laboratory assay.  If heparin results are below expected values, and patient dosage has  been confirmed, suggest follow up testing of antithrombin III levels. Performed at [facility], [address] [place], [place] [address]   Glucose, capillary     Status: Abnormal   Collection Time: 02/19/22 11:13 PM  Result Value Ref Range   Glucose-Capillary 295 (H) 70 - 99 mg/dL    Comment: Glucose reference range  applies only to samples taken after fasting for at least 8 hours.  Glucose, capillary     Status: Abnormal   Collection Time: 02/20/22  3:08 AM  Result Value Ref Range   Glucose-Capillary 294 (H) 70 - 99 mg/dL    Comment: Glucose reference range applies only to samples taken after fasting for at least 8 hours.  Magnesium     Status: Abnormal   Collection Time: 02/20/22  3:59 AM  Result Value Ref Range   Magnesium 3.3 (H) 1.7 - 2.4 mg/dL    Comment: Performed at [facility], [address] [place], [place] [address]  Phosphorus     Status: Abnormal   Collection Time: 02/20/22  3:59 AM  Result Value Ref Range   Phosphorus 4.8 (H) 2.5 - 4.6 mg/dL    Comment: Performed at [facility], [address] [place], [place] [address]  CBC with Differential/Platelet     Status: Abnormal   Collection Time: 02/20/22  3:59 AM  Result Value Ref Range   WBC 16.6 (H) 4.0 - 10.5 K/uL   RBC 3.61 (L) 4.22 - 5.81 MIL/uL   Hemoglobin 9.7 (L) 13.0 - 17.0 g/dL   HCT 34.6 (L) 39.0 - 52.0 %   MCV 95.8 80.0 - 100.0 fL   MCH 26.9 26.0 - 34.0 pg   MCHC 28.0 (L) 30.0 - 36.0 g/dL   RDW 16.0 (H) 11.5 - 15.5 %   Platelets 192 150 - 400 K/uL   nRBC 0.5 (H) 0.0 - 0.2 %   Neutrophils Relative % 85 %   Neutro Abs 14.0 (H) 1.7 - 7.7 K/uL   Lymphocytes Relative 6 %   Lymphs Abs 1.0 0.7 - 4.0 K/uL   Monocytes Relative 8 %   Monocytes Absolute 1.3 (H) 0.1 - 1.0 K/uL   Eosinophils Relative 0 %   Eosinophils Absolute 0.0 0.0 - 0.5 K/uL   Basophils Relative 0 %   Basophils Absolute 0.0 0.0 - 0.1 K/uL   Immature Granulocytes 1 %   Abs Immature Granulocytes 0.16 (H) 0.00 - 0.07 K/uL    Comment: Performed at [facility], [address], [place], [place] [address]  Basic metabolic panel     Status: Abnormal   Collection Time: 02/20/22  3:59 AM  Result Value Ref Range   Sodium 151 (H) 135 - 145 mmol/L   Potassium 5.4 (H) 3.5 - 5.1 mmol/L   Chloride 117 (H) 98 - 111 mmol/L   CO2 27 22  - 32 mmol/L   Glucose, Bld 322 (H) 70 - 99 mg/dL    Comment: Glucose reference range applies only to samples taken after fasting for at least 8 hours.   BUN 125 (H) 6 - 20 mg/dL    Comment: RESULT CONFIRMED BY MANUAL DILUTION HNM   Creatinine, Ser 3.94 (H)  0.61 - 1.24 mg/dL   Calcium 8.4 (L) 8.9 - 10.3 mg/dL   GFR, Estimated 18 (L) >60 mL/min    Comment: (NOTE) Calculated using the CKD-EPI Creatinine Equation (2021)    Anion gap 7 5 - 15    Comment: Performed at [facility], [address] [place], [place] [address]  C-reactive protein     Status: Abnormal   Collection Time: 02/20/22  3:59 AM  Result Value Ref Range   CRP 26.1 (H) <1.0 mg/dL    Comment: Performed at [facility] [address], [place], [place] [address]  Procalcitonin - Baseline     Status: None   Collection Time: 02/20/22  3:59 AM  Result Value Ref Range   Procalcitonin 7.30 ng/mL    Comment:        Interpretation: PCT > 2 ng/mL: Systemic infection (sepsis) is likely, unless other causes are known. (NOTE)       Sepsis PCT Algorithm           Lower Respiratory Tract                                      Infection PCT Algorithm    ----------------------------     ----------------------------         PCT < 0.25 ng/mL                PCT < 0.10 ng/mL          Strongly encourage             Strongly discourage   discontinuation of antibiotics    initiation of antibiotics    ----------------------------     -----------------------------       PCT 0.25 - 0.50 ng/mL            PCT 0.10 - 0.25 ng/mL               OR       >80% decrease in PCT            Discourage initiation of                                            antibiotics      Encourage discontinuation           of antibiotics    ----------------------------     -----------------------------         PCT >= 0.50 ng/mL              PCT 0.26 - 0.50 ng/mL               AND       <80% decrease in PCT              Encourage initiation of                                              antibiotics       Encourage continuation           of antibiotics    ----------------------------     -----------------------------        PCT >= 0.50  ng/mL                  PCT > 0.50 ng/mL               AND         increase in PCT                  Strongly encourage                                      initiation of antibiotics    Strongly encourage escalation           of antibiotics                                     -----------------------------                                           PCT <= 0.25 ng/mL                                                 OR                                        > 80% decrease in PCT                                      Discontinue / Do not initiate                                             antibiotics  Performed at [facility], [address] [place], [place] [address]   Heparin level (unfractionated)     Status: None   Collection Time: 02/20/22  3:59 AM  Result Value Ref Range   Heparin Unfractionated 0.49 0.30 - 0.70 IU/mL    Comment: (NOTE) The clinical reportable range upper limit is being lowered to >1.10 to align with the FDA approved guidance for the current laboratory assay.  If heparin results are below expected values, and patient dosage has  been confirmed, suggest follow up testing of antithrombin III levels. Performed at [facility], [address] [place], [place] [address]   Blood gas, arterial     Status: Abnormal   Collection Time: 02/20/22  4:32 AM  Result Value Ref Range   FIO2 75 %   Delivery systems VENTILATOR    MECHVT 500 mL   PEEP 18 cm H20   pH, Arterial 7.27 (L) 7.35 - 7.45   pCO2 arterial 66 (HH) 32 - 48 mmHg   pO2, Arterial 74 (L) 83 - 108 mmHg   Bicarbonate 30.3 (H) 20.0 - 28.0 mmol/L   Acid-Base Excess 1.6 0.0 - 2.0 mmol/L   O2 Saturation 95.5 %   Patient temperature  37.0    Collection site A-LINE    Drawn by ARTERIAL DRAW     Mechanical Rate 30     Comment: Performed at [facility], [address] [place], [place] [address]  Glucose, capillary     Status: Abnormal   Collection Time: 02/20/22  7:36 AM  Result Value Ref Range   Glucose-Capillary 168 (H) 70 - 99 mg/dL    Comment: Glucose reference range applies only to samples taken after fasting for at least 8 hours.  Glucose, capillary     Status: Abnormal   Collection Time: 02/20/22 11:05 AM  Result Value Ref Range   Glucose-Capillary 179 (H) 70 - 99 mg/dL    Comment: Glucose reference range applies only to samples taken after fasting for at least 8 hours.  Glucose, capillary     Status: Abnormal   Collection Time: 02/20/22  3:12 PM  Result Value Ref Range   Glucose-Capillary 204 (H) 70 - 99 mg/dL    Comment: Glucose reference range applies only to samples taken after fasting for at least 8 hours.  Glucose, capillary     Status: Abnormal   Collection Time: 02/20/22  6:56 PM  Result Value Ref Range   Glucose-Capillary 256 (H) 70 - 99 mg/dL    Comment: Glucose reference range applies only to samples taken after fasting for at least 8 hours.  Glucose, capillary     Status: Abnormal   Collection Time: 02/20/22  7:19 PM  Result Value Ref Range   Glucose-Capillary 253 (H) 70 - 99 mg/dL    Comment: Glucose reference range applies only to samples taken after fasting for at least 8 hours.  Glucose, capillary     Status: Abnormal   Collection Time: 02/20/22 11:24 PM  Result Value Ref Range   Glucose-Capillary 264 (H) 70 - 99 mg/dL    Comment: Glucose reference range applies only to samples taken after fasting for at least 8 hours.  Glucose, capillary     Status: Abnormal   Collection Time: 02/21/22  3:13 AM  Result Value Ref Range   Glucose-Capillary 247 (H) 70 - 99 mg/dL    Comment: Glucose reference range applies only to samples taken after fasting for at least 8 hours.  Magnesium     Status: Abnormal   Collection Time: 02/21/22  3:54 AM  Result  Value Ref Range   Magnesium 3.2 (H) 1.7 - 2.4 mg/dL    Comment: Performed at [facility], [address] [place], [place] [address]  Phosphorus     Status: Abnormal   Collection Time: 02/21/22  3:54 AM  Result Value Ref Range   Phosphorus 5.3 (H) 2.5 - 4.6 mg/dL    Comment: Performed at [facility], [address] [place], [place] [address]  CBC with Differential/Platelet     Status: Abnormal   Collection Time: 02/21/22  3:54 AM  Result Value Ref Range   WBC 16.2 (H) 4.0 - 10.5 K/uL   RBC 3.68 (L) 4.22 - 5.81 MIL/uL   Hemoglobin 9.7 (L) 13.0 - 17.0 g/dL   HCT 35.3 (L) 39.0 - 52.0 %   MCV 95.9 80.0 - 100.0 fL   MCH 26.4 26.0 - 34.0 pg   MCHC 27.5 (L) 30.0 - 36.0 g/dL   RDW 15.9 (H) 11.5 - 15.5 %   Platelets 192 150 - 400 K/uL   nRBC 0.3 (H) 0.0 - 0.2 %   Neutrophils Relative % 85 %   Neutro Abs  13.7 (H) 1.7 - 7.7 K/uL   Lymphocytes Relative 7 %   Lymphs Abs 1.1 0.7 - 4.0 K/uL   Monocytes Relative 7 %   Monocytes Absolute 1.2 (H) 0.1 - 1.0 K/uL   Eosinophils Relative 0 %   Eosinophils Absolute 0.0 0.0 - 0.5 K/uL   Basophils Relative 0 %   Basophils Absolute 0.0 0.0 - 0.1 K/uL   Immature Granulocytes 1 %   Abs Immature Granulocytes 0.17 (H) 0.00 - 0.07 K/uL    Comment: Performed at [facility], [address] [place], [place] [address]  Basic metabolic panel     Status: Abnormal   Collection Time: 02/21/22  3:54 AM  Result Value Ref Range   Sodium 156 (H) 135 - 145 mmol/L   Potassium 5.6 (H) 3.5 - 5.1 mmol/L   Chloride 122 (H) 98 - 111 mmol/L   CO2 27 22 - 32 mmol/L   Glucose, Bld 273 (H) 70 - 99 mg/dL    Comment: Glucose reference range applies only to samples taken after fasting for at least 8 hours.   BUN 143 (H) 6 - 20 mg/dL    Comment: RESULT CONFIRMED BY MANUAL DILUTION RH   Creatinine, Ser 3.52 (H) 0.61 - 1.24 mg/dL   Calcium 8.6 (L) 8.9 - 10.3 mg/dL   GFR, Estimated 21 (L) >60 mL/min    Comment: (NOTE) Calculated using the  CKD-EPI Creatinine Equation (2021)    Anion gap 7 5 - 15    Comment: Performed at [facility], [address] [place], [place] [address]  C-reactive protein     Status: Abnormal   Collection Time: 02/21/22  3:54 AM  Result Value Ref Range   CRP 15.4 (H) <1.0 mg/dL    Comment: Performed at [facility] [address], [place], [place] [address]  Heparin level (unfractionated)     Status: None   Collection Time: 02/21/22  3:54 AM  Result Value Ref Range   Heparin Unfractionated 0.57 0.30 - 0.70 IU/mL    Comment: (NOTE) The clinical reportable range upper limit is being lowered to >1.10 to align with the FDA approved guidance for the current laboratory assay.  If heparin results are below expected values, and patient dosage has  been confirmed, suggest follow up testing of antithrombin III levels. Performed at [facility], [address] [place], [place] [address]   Glucose, capillary     Status: Abnormal   Collection Time: 02/21/22  7:21 AM  Result Value Ref Range   Glucose-Capillary 223 (H) 70 - 99 mg/dL    Comment: Glucose reference range applies only to samples taken after fasting for at least 8 hours.  Glucose, capillary     Status: Abnormal   Collection Time: 02/21/22 11:11 AM  Result Value Ref Range   Glucose-Capillary 202 (H) 70 - 99 mg/dL    Comment: Glucose reference range applies only to samples taken after fasting for at least 8 hours.  Glucose, capillary     Status: None   Collection Time: 02/21/22  4:43 PM  Result Value Ref Range   Glucose-Capillary 94 70 - 99 mg/dL    Comment: Glucose reference range applies only to samples taken after fasting for at least 8 hours.  . CT CHEST ABDOMEN PELVIS WO CONTRAST  Result Date: 02/21/2022 CLINICAL DATA:  Sepsis EXAM: CT CHEST, ABDOMEN AND PELVIS WITHOUT CONTRAST TECHNIQUE: Multidetector CT imaging of the chest, abdomen and pelvis was performed following the standard  protocol without IV contrast.  RADIATION DOSE REDUCTION: This exam was performed according to the departmental dose-optimization program which includes automated exposure control, adjustment of the mA and/or kV according to patient size and/or use of iterative reconstruction technique. COMPARISON:  Previous studies including chest radiograph done earlier today, CT chest done on 06/04/2020 FINDINGS: CT CHEST FINDINGS Cardiovascular: Heart is enlarged in size. There is ectasia of main pulmonary artery measuring 4.2 cm suggesting pulmonary arterial hypertension. Mediastinum/Nodes: There are slightly enlarged lymph nodes in mediastinum largest in the precarinal region measuring 1.3 cm in short axis. Tip of endotracheal tube is at the level of aortic arch. Enteric tube is noted traversing the esophagus. Tip of right PICC line is seen in superior vena cava. Lungs/Pleura: Patchy ground-glass and alveolar infiltrates are seen in both lungs. Dense infiltrates are seen in the posterior aspects of both lower lung fields with air bronchograms. Small pleural effusions are seen. There is no pneumothorax. Musculoskeletal: No acute abnormalities are seen in the bony structures in the thorax. CT ABDOMEN PELVIS FINDINGS Hepatobiliary: No focal abnormalities are seen in liver. There is no dilation of bile ducts. Surgical clips are seen in gallbladder fossa. Pancreas: No focal abnormalities are seen. Spleen: Spleen measures 13.9 cm in maximum diameter. Adrenals/Urinary Tract: Adrenals are unremarkable. There is no hydronephrosis. There are no renal or ureteral stones. Foley catheter is seen in the bladder. There is no significant wall thickening in the bladder. Stomach/Bowel: Tip of NG tube is seen in the antrum of the stomach. There is contrast in the lumen of stomach and few small bowel loops. Small bowel loops are not dilated. The appendix is not dilated. There is no significant wall thickening in colon. There is no pericolic stranding. Few diverticula seen in  the colon without signs of focal diverticulitis. Vascular/Lymphatic: Scattered arterial calcifications are seen. Reproductive: Coarse calcifications are seen prostate. Other: There is no ascites or pneumoperitoneum. Musculoskeletal: Severe degenerative changes are noted at the L4-L5 level in lumbar spine with significant interval worsening in comparison with the study of 01/28/2016. IMPRESSION: Extensive ground-glass, alveolar and patchy dense infiltrates in both lungs suggesting multifocal pneumonia. Part of this finding may suggest underlying pulmonary edema. Small bilateral pleural effusions are seen. Cardiomegaly. There is ectasia of the main pulmonary artery suggesting pulmonary arterial hypertension. There is no evidence of intestinal obstruction or pneumoperitoneum. There is no hydronephrosis. Appendix is not dilated. UB diverticula are seen in the colon without signs of focal diverticulitis. Severe degenerative changes are noted at L4-L5 level with significant interval worsening. Findings may be due to severe disc degeneration. If there is clinical suspicion for discitis or osteomyelitis, follow-up MRI may be considered. Electronically Signed   By: Elmer Picker M.D.   On: 02/21/2022 16:17   CT HEAD WO CONTRAST (5MM)  Result Date: 02/21/2022 CLINICAL DATA:  Mental status change EXAM: CT HEAD WITHOUT CONTRAST TECHNIQUE: Contiguous axial images were obtained from the base of the skull through the vertex without intravenous contrast. RADIATION DOSE REDUCTION: This exam was performed according to the departmental dose-optimization program which includes automated exposure control, adjustment of the mA and/or kV according to patient size and/or use of iterative reconstruction technique. COMPARISON:  Head CT dated December 26, 2020 FINDINGS: Brain: No evidence of acute infarction, hemorrhage, hydrocephalus, extra-axial collection or mass lesion/mass effect. Vascular: No hyperdense vessel or unexpected  calcification. Skull: Normal. Negative for fracture or focal lesion. Sinuses/Orbits: Diffuse wall thickening of the paranasal sinuses with air-fluid level seen in the bilateral  maxillary and left frontal sinus. Bilateral complete opacification of the mastoid air cells and inner ears. Other: None. IMPRESSION: 1. No acute intracranial abnormality. 2. Paranasal sinusitis. 3. Bilateral complete opacification of the mastoid air cells and inner ears, as can be seen in the setting of otitis media. Correlate with symptoms. Electronically Signed   By: Yetta Glassman M.D.   On: 02/21/2022 16:04   NM Pulmonary Perfusion  Result Date: 02/21/2022 CLINICAL DATA:  Shortness of breath, BILATERAL lower extremity edema EXAM: NUCLEAR MEDICINE PERFUSION LUNG SCAN TECHNIQUE: Perfusion images were obtained in multiple projections after intravenous injection of radiopharmaceutical. Ventilation scans intentionally deferred if perfusion scan and chest x-ray adequate for interpretation during COVID 19 epidemic. RADIOPHARMACEUTICALS:  4.35 mCi Tc-56m MAA IV COMPARISON:  Chest radiograph 02/21/2022 FINDINGS: Enlargement of cardiac silhouette. Small subsegmental perfusion defect lateral LEFT lower lobe corresponding to dense atelectasis versus consolidation on chest radiograph. No additional perfusion defects identified. IMPRESSION: Pulmonary embolism absent. Electronically Signed   By: Lavonia Dana M.D.   On: 02/21/2022 15:56   DG Chest Port 1 View  Result Date: 02/21/2022 CLINICAL DATA:  Acute respiratory failure with hypoxia EXAM: PORTABLE CHEST 1 VIEW COMPARISON:  Yesterday FINDINGS: Endotracheal tube tip appears to project just at the lower margin of the clavicular heads. An enteric tube reaches the stomach. Right PICC with tip at distal SVC. Unchanged cardiomegaly and diffuse pulmonary opacity. No visible effusion or pneumothorax IMPRESSION: 1. Unremarkable hardware positioning. 2. Unchanged diffuse airspace disease. Electronically  Signed   By: Jorje Guild M.D.   On: 02/21/2022 06:14   DG Chest Port 1 View  Result Date: 02/20/2022 CLINICAL DATA:  Acute on chronic respiratory failure EXAM: PORTABLE CHEST 1 VIEW COMPARISON:  Yesterday FINDINGS: Cardiomegaly and diffuse pulmonary opacity. Improved aeration of the left upper lobe compared to prior. Endotracheal tube with tip at the clavicular heads. The enteric tube reaches the stomach. Right PICC with tip at the SVC. IMPRESSION: 1. Unremarkable hardware positioning. 2. Bilateral airspace disease with interval reinflation of the left upper lobe. Electronically Signed   By: Jorje Guild M.D.   On: 02/20/2022 05:31  .   ASSESSMENT: Respiratory failure PLAN: Critical care consult involving over 31 minutes of consultation, chart review, examination, discussion with family, and care coordination. Will schedule tracheostomy. Risks including bleeding, infection, scarring, recurrent laryngeal nerve injury to be discussed with patient's daughter and consent obtained. Heparin will need to be held day of surgery. Tentatively scheduled for next Tuesday unless condition changes   [name], MD 02/21/2022 4:57 PM

## 2022-02-21 NOTE — Progress Notes (Addendum)
NAME:  Samuel Chavez, MRN:  [id number], DOB:  May 03, 1976, LOS: 46 ADMISSION DATE:  02/11/2022, CONSULTATION DATE: 02/11/22 REFERRING MD:  Eugenie Norrie MD  CHIEF COMPLAINT: SOB   CC  follow up RESP FAILURE/acute CHF exacerbation  HPI/SYNOPSIS  46 y.o male with significant PMH as below who presented to the ED with chief complaints of SOB and worsening bilateral lower extremities edema.  ED Course: In the emergency department, the temperature was 37.3C, the heart rate 99 beats/minute, the blood pressure 187/102 mm Hg, the respiratory rate 20 breaths/minute, and the oxygen saturation 89% on. He was noted to be drowsy but still protecting his airway and responding appropriately.  Pertinent INITIAL Labs/Diagnostics Findings: Chemistry:Glucose:319 Calcium: 8.8 otherwise unremarkable CBC: unremarkable Other Lab findings: ABG with hypoxemia and hypercapnia, .  Imaging: Chest x-ray showed bilateral infiltrates worse on right  -02/17/22- patient is severely critically ill with bilateral multifocal pneumonia on sedation and paralysis with mechanical ventilation maximal settings.  He is at cusp of death, we met with daughter today and discussed severity of illness. Unable to perform CT due to severity of critical illness. Met with previous PCCM doc and discussed medical plan.  Patient had recruitment maneuvers last few days without improvement, on heparin gtt for possible PE.   02/18/22- patient continues to require maximal settings on ventilator despite good UOP.  He destaturated to <50% spO2 on MV today required BGV.   02/19/22- patient continues to require 100% FiO2 on maximal setting with high PEEP ladder for possible ARDS.  He was diuresed and on steroids with development of AKI.  His CXR had slight interval improvement on right. We discussed case with vascular surgery regarding possible empiric tPA for PE.  02/20/22- patient was unable to get CT today due to continued severe hypoxemia.  Daughter and  other family members at bedside we reviewed case and medical plan.  There seems to be some confusion from family as they had asked me to wake patient up and take off ventilator even though I had explained multiple times that he is at very high risk for death.  They laughed at my comments and seemed to think it was not true. We may still be able to do bronchoscopy but its high risk.  We have reduced IV infusions by switching some medications to OGT route.   02/21/22- patient is weaned to 60%FiO2.  Plan for possible bronch if patient is tolerating.  Family at bedside.  If able to we will obtain more imaging and VQ scan to rule out PE.   Significant Hospital Events   7/25: Admitted to hospitalist service w/acute on chronic hypoxic hypercapnic resp. failure requiring BiPAP.Failed BiPAP and intubated. PCCM consulted 7/26: INTUBATED, difficlut to sedate, started [place] 7/27: severe hypoxia, PEEP increased to 15 but decreased back to 10 7/28: Persistent severe hypoxia, ventilator changes made 7/29: Persistent hypoxia, recruitment maneuvers instituted, ventilator changes made, empiric heparin as patient cannot be scanned query PE  Consults:  PCCM  Procedures:  7/25: Intubation 7/26: PICC triple-lumen, right basilic vein  Significant Diagnostic Tests:  7/25  Chest Xray:Chest x-ray showed cardiomegaly and pulmonary vascular congestion without overt pulmonary edema 7/25 Echocardiogram: difficult study, LVEF was estimated at 55 to 60%, dilated LV moderate LVH, grade 1 DD, enlargement of the right ventricle, left atrial size moderately dilated, right atrial size moderately dilated this is consistent with restrictive physiology (echo reading not congruous with prior echoes from [facility]) 7/30 Limited echo: LVEF 40 to 45%  decreased LV function, no wall motion abnormalities, moderate dilation of the LV concentric LVH, right ventricular size moderately enlarged, right atrial enlargement, this  is more consistent with prior [facility] scans  Micro Data:  7/25: SARS-CoV-2 PCR> negative 7/25: MRSA PCR>> NEG 7/27: Strep pneumoniae Ag>> negative 7/27: Legionella Ag>> 7/27: Mycoplasma>> 7/27: Sputum>> Strep pneumo,Haemophilus influenzae 7/30 Sputum cult >>  Antimicrobials:  7/27: Maxipime >> 7/27: Vancomycin >> 7/29 7/30: Vancomycin (resumed due to fever spike on Maxipime) ? Resistant Strep pneumo   REVIEW OF SYSTEMS Patient is unable to provide complete review of systems due to severe critical illness/ventilator dependence  OBJECTIVE  Blood pressure 120/72, pulse 90, temperature 99.9 F (37.7 C), temperature source Esophageal, resp. rate (!) 30, height 5' 9.02" (1.753 m), weight (!) 210 kg, SpO2 94 %. CVP:  [21 mmHg-31 mmHg] 21 mmHg   Vent Mode: PRVC FiO2 (%):  [60 %-75 %] 60 % Set Rate:  [30 bmp] 30 bmp Vt Set:  [500 mL] 500 mL PEEP:  [18 cmH20] 18 cmH20 Plateau Pressure:  [34 cmH20-42 cmH20] 34 cmH20   Intake/Output Summary (Last 24 hours) at 02/21/2022 1028 Last data filed at 02/21/2022 1000 Gross per 24 hour  Intake 2666.83 ml  Output 3350 ml  Net -683.17 ml    Filed Weights   02/19/22 0400 02/20/22 0420 02/21/22 0320  Weight: (!) 197 kg (!) 208 kg (!) 210 kg   PHYSICAL EXAMINATION: GENERAL: Morbidly obese man, intubated, mechanically ventilated, sedated.  Malodorous mouth secretions. HEAD: Normocephalic, atraumatic.  EYES: Pupils equal, round, reactive to light.  No scleral icterus.  MOUTH: Macroglossia (massive).  Orotracheally intubated, OG in place. NECK: Supple. No thyromegaly. Trachea midline. No JVD.  No adenopathy. PULMONARY: Good air entry bilaterally.  Occasional rhonchi noted. CARDIOVASCULAR: S1 and S2.  Sinus tach.  ABDOMEN: Obese, soft, nondistended, tolerating tube feeds. MUSCULOSKELETAL: No joint deformity, no clubbing, no edema.  SCDs in place. NEUROLOGIC: Sedated and paralyzed on ventilator GCS 4T SKIN: Intact,warm,dry.  Chronic  stasis changes in the lower extremities. PSYCH: Not assessed due to mechanically ventilated status.  Labs/imaging that I havepersonally reviewed    Labs   CBC: Recent Labs  Lab 02/17/22 0305 02/18/22 0326 02/19/22 0412 02/20/22 0359 02/21/22 0354  WBC 14.6* 15.3* 12.3* 16.6* 16.2*  NEUTROABS 11.6* 11.9* 10.3* 14.0* 13.7*  HGB 9.5* 10.8* 10.1* 9.7* 9.7*  HCT 34.7* 39.8 37.4* 34.6* 35.3*  MCV 98.6 96.8 96.6 95.8 95.9  PLT 134* 172 181 192 192     Basic Metabolic Panel: Recent Labs  Lab 02/17/22 0305 02/18/22 0326 02/18/22 1331 02/19/22 0412 02/19/22 0920 02/19/22 1337 02/20/22 0359 02/21/22 0354  NA 145 151* 151* 151*  --   --  151* 156*  K 3.9 5.0 5.3* 5.7* 5.5* 5.8* 5.4* 5.6*  CL 112* 111 114* 113*  --   --  117* 122*  CO2 27 32 31 30  --   --  27 27  GLUCOSE 181* 177* 222* 246*  --   --  322* 273*  BUN 36* 72* 72* 94*  --   --  125* 143*  CREATININE 0.84 1.88* 1.64* 2.61*  --   --  3.94* 3.52*  CALCIUM 6.8* 8.3* 8.4* 8.2*  --   --  8.4* 8.6*  MG 2.1 2.7*  --  2.9*  --   --  3.3* 3.2*  PHOS 4.6 4.1  --  3.4  --   --  4.8* 5.3*    GFR: Estimated Creatinine Clearance:  47.9 mL/min (A) (by C-G formula based on SCr of 3.52 mg/dL (H)). Recent Labs  Lab 02/18/22 0326 02/19/22 0412 02/19/22 0920 02/20/22 0359 02/21/22 0354  PROCALCITON  --   --  8.62 7.30  --   WBC 15.3* 12.3*  --  16.6* 16.2*     Liver Function Tests: No results for input(s): "AST", "ALT", "ALKPHOS", "BILITOT", "PROT", "ALBUMIN" in the last 168 hours.  ABG    Component Value Date/Time   PHART 7.27 (L) 02/20/2022 0432   PCO2ART 66 (HH) 02/20/2022 0432   PO2ART 74 (L) 02/20/2022 0432   HCO3 30.3 (H) 02/20/2022 0432   TCO2 26 03/17/2007 1822   O2SAT 95.5 02/20/2022 0432     Home Medications  Prior to Admission medications   Medication Sig Start Date End Date Taking? Authorizing Provider  losartan (COZAAR) 100 MG tablet Take 100 mg by mouth daily. 02/01/22  Yes [provider]   OZEMPIC, 0.25 OR 0.5 MG/DOSE, 2 MG/3ML SOPN SMARTSIG:0.25 Milligram(s) SUB-Q Once a Week 12/09/21  Yes [provider]  torsemide (DEMADEX) 20 MG tablet Take 20 mg by mouth daily. 12/18/21  Yes [provider]  Aspirin-Salicylamide-Caffeine (BC HEADACHE POWDER PO) Take 1 packet by mouth as needed (for pain).    [provider]  cephALEXin (KEFLEX) 500 MG capsule Take 1 capsule (500 mg total) by mouth 4 (four) times daily. Patient not taking: Reported on 02/11/2022 12/16/18   Davonna Belling, MD  cyclobenzaprine (FLEXERIL) 10 MG tablet Take 0.5-1 tablets (5-10 mg total) by mouth 2 (two) times daily as needed for muscle spasms. Patient not taking: Reported on 02/11/2022 03/12/18   Margarita Mail, PA-C  gabapentin (NEURONTIN) 600 MG tablet Take 600 mg by mouth at bedtime. Patient not taking: Reported on 02/11/2022 10/13/21   [provider]  LABETALOL HCL PO Take by mouth. Patient not taking: Reported on 02/11/2022    [provider]  meloxicam (MOBIC) 15 MG tablet Take 1 tablet (15 mg total) by mouth daily. Take 1 daily with food. Patient not taking: Reported on 02/11/2022 03/12/18   Margarita Mail, PA-C  metFORMIN (GLUCOPHAGE) 500 MG tablet Take 500 mg by mouth 2 (two) times daily with a meal. Patient not taking: Reported on 02/11/2022    [provider]  METFORMIN HCL ER, MOD, PO Take by mouth. Patient not taking: Reported on 02/11/2022    [provider]  METOPROLOL SUCCINATE ER PO Take by mouth. Patient not taking: Reported on 02/11/2022    [provider]  oxyCODONE-acetaminophen (PERCOCET) 10-325 MG tablet Take 1 tablet by mouth See admin instructions. Take 1 tablet by mouth 4-5 times a day as needed for pain Patient not taking: Reported on 02/11/2022    [provider]  oxyCODONE-acetaminophen (PERCOCET/ROXICET) 5-325 MG tablet Take 1 tablet by mouth every 8 (eight) hours as needed for severe pain. Patient not taking:  Reported on 02/11/2022 12/16/18   Davonna Belling, MD  predniSONE (DELTASONE) 20 MG tablet Take 2 tablets (40 mg total) by mouth daily. Patient not taking: Reported on 02/11/2022 04/04/15   Davonna Belling, MD   Hospital Scheduled Meds:  artificial tears  1 Application Both Eyes K8L   budesonide (PULMICORT) nebulizer solution  0.5 mg Nebulization BID   Chlorhexidine Gluconate Cloth  6 each Topical Q0600   dexamethasone (DECADRON) injection  8 mg Intravenous Q8H   diphenhydrAMINE  25 mg Intravenous Q8H   feeding supplement (PROSource TF)  90 mL Per Tube 5 X Daily  free water  100 mL Per Tube Q4H   insulin aspart  0-20 Units Subcutaneous Q4H   insulin aspart  8 Units Subcutaneous Q4H   insulin glargine-yfgn  20 Units Subcutaneous BID   insulin starter kit- pen needles  1 kit Other Once   ipratropium-albuterol  3 mL Nebulization Q6H   living well with diabetes book   Does not apply Once   multivitamin  1 tablet Per Tube QHS   mouth rinse  15 mL Mouth Rinse Q2H   oxyCODONE  10 mg Per Tube Q6H   pantoprazole sodium  40 mg Per Tube QHS   phenobarbital  97.2 mg Per Tube TID   polyethylene glycol  17 g Per Tube Daily   senna-docusate  1 tablet Per Tube BID   sodium chloride flush  10-40 mL Intracatheter Q12H   sodium chloride flush  3 mL Intravenous Q12H   Continuous Infusions:  sodium chloride 250 mL (02/19/22 1011)   sodium chloride Stopped (02/11/22 2011)   cefTRIAXone (ROCEPHIN)  IV 2 g (02/21/22 0832)   feeding supplement (VITAL 1.5 CAL) 45 mL/hr at 02/21/22 0400   fentaNYL infusion INTRAVENOUS 200 mcg/hr (02/21/22 0400)   heparin 2,000 Units/hr (02/21/22 0812)   midazolam 6 mg/hr (02/21/22 0400)   PRN Meds:.sodium chloride, acetaminophen **OR** acetaminophen, cyclobenzaprine, docusate sodium, fentaNYL, midazolam, ondansetron **OR** ondansetron (ZOFRAN) IV, mouth rinse, polyethylene glycol, sodium chloride flush, sodium chloride flush, traZODone      Active Hospital Problem  list    Patient Active Problem List   Diagnosis Date Noted   Acute respiratory failure ([place]) 02/11/2022   Respiratory failure ([place]) 02/11/2022   Acute respiratory failure with hypoxia and hypercarbia (HCC) 02/11/2022   Acute on chronic systolic CHF (congestive heart failure) ([place]) 02/11/2022   Hypertensive urgency 02/11/2022   Type 2 diabetes mellitus with peripheral neuropathy ([place]) 02/11/2022   OBSTRUCTIVE SLEEP APNEA 04/30/2009    Assessment & Plan:   45 yo morbidly obese AAM with severe decompensated acute systolic and diastolic heart failure with previously documented severe OSA/OHS, noncompliant with CPAP, active smoker, leading to severe hypoxic resp failure with encephalopathy and difficulty with sedation  Severe acute on chronic hypoxic and hypercapnic respiratory failure due to CAP and acute decompensation of chronic heart failure Persistent hypoxemia despite trial of several ventilator strategies continue Mechanical Ventilator support Wean Fio2 and PEEP as tolerated Continue VAP/VENT bundles  Wean PEEP & FiO2 as tolerated, maintain SpO2 > 88% Head of bed elevated 30 degrees, VAP protocol in place Plateau pressures less than 30 cm H20 as able Intermittent chest x-ray & ABG PRN Ensure adequate pulmonary hygiene  will NOT perform SAT/SBT due to high FiO2 requirements, asynchrony off sedation  Requiring paralytic infusion for ventilator synchrony Recruitment maneuvers every 4h and as needed. On heparin infusion as cannot exclude PE, however no DVT on LE Dopplers assuring, Patient cannot be proned, due to weight limit for safe proning and unstable airway (ET tube with constant need of repositioning) Vascular surgery consult - possible PE , we are considering tPA - appreciate recommendations   Community-acquired pneumonia: Streptococcus pneumoniae and Haemophilus influenzae Fever spike today Sputum re-cultured Changed antibiotics to ceftaz today -  Acute on chronic  systolic and diastolic heart failure LVEF 40 to 45% consistent with prior studies performed at [place] ventilator support Lasix twice daily IV Trial of milrinone Creatinine 0.9 today Diamox 500 mg x 1 Hold metoprolol and Losartan due to sedation related hypotension 2D Echocardiogram shows  diastolic dysfunction and enlarged left and right atria consistent with restrictive physiology  ACUTE KIDNEY INJURY/Renal Failure -continues to improve Element of cardiorenal syndrome -continue Foley catheter-assess need -Avoid nephrotoxic agents -Follow urine output, BMP -Ensure adequate renal perfusion, optimize oxygenation -Renal dose medications -Nephrology has signed off -On Lasix twice daily -Added milrinone   Intake/Output Summary (Last 24 hours) at 02/21/2022 1028 Last data filed at 02/21/2022 1000 Gross per 24 hour  Intake 2666.83 ml  Output 3350 ml  Net -683.17 ml        Latest Ref Rng & Units 02/21/2022    3:54 AM 02/20/2022    3:59 AM 02/19/2022    1:37 PM  BMP  Glucose 70 - 99 mg/dL 273  322    BUN 6 - 20 mg/dL 143  125    Creatinine 0.61 - 1.24 mg/dL 3.52  3.94    Sodium 135 - 145 mmol/L 156  151    Potassium 3.5 - 5.1 mmol/L 5.6  5.4  5.8   Chloride 98 - 111 mmol/L 122  117    CO2 22 - 32 mmol/L 27  27    Calcium 8.9 - 10.3 mg/dL 8.6  8.4      ENDO - ICU hypoglycemic\Hyperglycemia protocol -check FSBS per protocol -Diabetes mellitus -HgbA1c  10.1 -CBG's AC & hs; Target range of 140 to 180 -SSI -Follow ICU Hypo/Hyperglycemia protocol -Hold Metformin  GI GI PROPHYLAXIS PPI  NUTRITIONAL STATUS DIET-->TF's as tolerated Constipation protocol as indicated  ACUTE ANEMIA- Transfuse as needed Consider transfusion  if hgb<7  Best practice:  Diet: Tube feeds Pain/Anxiety/Delirium protocol (if indicated): Yes (RASS goal 0) VAP protocol (if indicated): Yes DVT prophylaxis: Systemic AC GI prophylaxis: PPI Glucose control:  SSI Yes Central venous  access:  N/A Arterial line:  N/A Foley:  Yes, and it is still needed Mobility:  bed rest  PT consulted: N/A Last date of multidisciplinary goals of care discussion [7/25] Code Status:  full code Disposition: ICU   Critical care provider statement:   Total critical care time: 33 minutes   Performed by: Lanney Gins MD   Critical care time was exclusive of separately billable procedures and treating other patients.   Critical care was necessary to treat or prevent imminent or life-threatening deterioration.   Critical care was time spent personally by me on the following activities: development of treatment plan with patient and/or surrogate as well as nursing, discussions with consultants, evaluation of patient's response to treatment, examination of patient, obtaining history from patient or surrogate, ordering and performing treatments and interventions, ordering and review of laboratory studies, ordering and review of radiographic studies, pulse oximetry and re-evaluation of patient's condition.    Ottie Glazier, M.D.  Pulmonary & Critical Care Medicine

## 2022-02-21 NOTE — Progress Notes (Signed)
Daily Progress Note   Patient Name: Samuel Chavez       Date: 02/21/2022 DOB: 1975/07/25  Age: 46 y.o. MRN#: [id number] Attending Physician: Ottie Glazier, MD Primary Care Physician: [facility] Medical Admit Date: 02/11/2022 Length of Stay: 10 days  Reason for Consultation/Follow-up: Establishing goals of care  HPI/Patient Profile:  46 y.o. male  with past medical history of type 2 diabetes mellitus, hypertension, pancreatitis, ongoing tobacco abuse, systolic CHF, OSA, type 2 diabetes mellitus and diabetic neuropathy, lumbar radiculopathy, and morbid obesity, who presented to the ER with acute onset of worsening lower extremity edema with associated dyspnea as well as orthopnea. He was found to have acute on chronic hypoxic and hypercapnic respiratory failure requiring BiPAP, which he failed.  He was subsequently intubated and transferred to the ICU he was admitted on 02/11/2022 with severe acute on chronic hypoxic and hypercapnic respiratory failure due to CAP and acute decompensated heart failure, persistent hypoxemia despite ventilator, community-acquired pneumonia, acute on chronic systolic and diastolic heart failure, acute kidney injury, and others.    During the patient's ICU stay he has continued to decompensate and decline despite maximal therapies.  PMT was consulted for goals of care conversation and support of patient and family.  Subjective:   Subjective: Chart Reviewed. Updates received. Patient Assessed. Created space and opportunity for patient  and family to explore thoughts and feelings regarding current medical situation.  Today's Discussion: I saw the patient at the bedside.  He is unable to meaningfully interact.  His daughter was not present at that time.  Discussed patient with the nurse and RT who stated he continues to have small improvements.  He just completed a bronchoscopy and got some words out.  He is currently on 30% FiO2.  Plan today is to take him to CT of  his chest and VQ scan as well.  There is an ENT consult placed for evaluation for trach candidacy given that he is unlikely to recover/be extubated soon.  I provided emotional and general support through therapeutic listening, empathy, sharing of stories, and other techniques. I answered all questions and addressed all concerns to the best of my ability.   Review of Systems  Unable to perform ROS: Intubated    Objective:   Vital Signs:  BP 120/72   Pulse 90   Temp 99.9 F (37.7 C) (Esophageal)   Resp (!) 30   Ht 5' 9.02" (1.753 m)   Wt (!) 210 kg   SpO2 94%   BMI 68.34 kg/m   Physical Exam: Physical Exam Vitals and nursing note reviewed.  Constitutional:      General: He is not in acute distress.    Appearance: He is obese. He is ill-appearing.     Interventions: He is sedated, chemically paralyzed and intubated.  HENT:     Head: Normocephalic and atraumatic.  Cardiovascular:     Rate and Rhythm: Normal rate.  Pulmonary:     Effort: Tachypnea (intended on vent settings RR=30) present. He is intubated.     Breath sounds: Decreased air movement present.  Abdominal:     General: Abdomen is protuberant.     Palpations: Abdomen is soft.  Skin:    General: Skin is warm and dry.  Neurological:     Mental Status: He is unresponsive.     Palliative Assessment/Data: 10% (feeding tube)   Assessment & Plan:   Impression: Present on Admission:  Respiratory failure ([place])  46 year old male who is critically ill with  respiratory failure, pneumonia, CHF, AKI. Kidneys somewhat worse overall but mild improvement today (3.94 -> 3.52). Respiratory status somewhat improved today. He is currently on 50% FiO2. Continued recruitment maneuvers. Planned CT Chest and VQ scan today. With full aggressive medical therapy he has had some mild improvement in his saturations.  His daughter seems to understand how sick he is, but continues to hold out hope for recovery.  She is steadfast in her  request for full scope of treatment and aggressive treatment. Continued support provided to patient's daughter Samuel Chavez.  SUMMARY OF RECOMMENDATIONS   Continue full scope of treatment Remain full code Continued emotional support of patient's family PMT will continue to follow  Symptom Management:  Per primary team PMT is available to assist as needed  Code Status: Full code  Prognosis: Unable to determine  Discharge Planning: To Be Determined  Discussed with: Medical team, nursing team  Thank you for allowing [place] to participate in the care of Samuel Chavez PMT will continue to support holistically.  Billing based on MDM: High  Problems Addressed: One acute or chronic illness or injury that poses a threat to life or bodily function  Amount and/or Complexity of Data: Category 3:Discussion of management or test interpretation with external physician/other qualified health care professional/appropriate source (not separately reported)  Risks: N/A   Walden Field, NP Palliative Medicine Team  Team Phone # [phone] (Nights/Weekends)  03/19/2021, 8:17 AM

## 2022-02-21 NOTE — Consult Note (Signed)
PHARMACY CONSULT NOTE  Pharmacy Consult for Electrolyte Monitoring and Replacement   Recent Labs: Potassium (mmol/L)  Date Value  02/21/2022 5.6 (H)   Magnesium (mg/dL)  Date Value  02/21/2022 3.2 (H)   Calcium (mg/dL)  Date Value  02/21/2022 8.6 (L)   Albumin (g/dL)  Date Value  02/11/2022 3.5   Phosphorus (mg/dL)  Date Value  02/21/2022 5.3 (H)   Sodium (mmol/L)  Date Value  02/21/2022 156 (H)   Assessment: Patient is a 46 y/o M with medical history including DM, HTN, pancreatitis, tobacco use disorder, systolic CHF, OSA, DM c/b diabetic neuropathy, lumbar radiculopathy, morbid obesity who is admitted with acute respiratory failure in setting of acute CHF and hypertensive urgency. Patient is currently intubated, sedated, and on mechanical ventilation in the ICU. Pharmacy consulted to assist with electrolyte monitoring and replacement as indicated.  Nutrition: Tube feeds at 45 mL/hr (~ 1 L/day) + free water increased to 250 mL q2h on 8/4 (3 L/day)   Diuretics: IV Lasix 40 mg BID (discontinued 8/1 for AKI)  Nephrology consulted for AKI, monitoring closely for need for RRT  Goal of Therapy:  Electrolytes within normal limits  Plan:  --Na 160 from 156, defer management to nephrology/PCCM (free water flushes increased 8/4) --K 5.2. Scr improved today. Continue with [place] 16.8 g daily --Re-check electrolytes with AM labs tomorrow  Benita Gutter  02/21/2022 2:12 PM

## 2022-02-22 LAB — CBC WITH DIFFERENTIAL/PLATELET
Abs Immature Granulocytes: 0.14 10*3/uL — ABNORMAL HIGH (ref 0.00–0.07)
Basophils Absolute: 0 10*3/uL (ref 0.0–0.1)
Basophils Relative: 0 %
Eosinophils Absolute: 0.2 10*3/uL (ref 0.0–0.5)
Eosinophils Relative: 1 %
HCT: 34 % — ABNORMAL LOW (ref 39.0–52.0)
Hemoglobin: 9.4 g/dL — ABNORMAL LOW (ref 13.0–17.0)
Immature Granulocytes: 1 %
Lymphocytes Relative: 10 %
Lymphs Abs: 1.5 10*3/uL (ref 0.7–4.0)
MCH: 26.1 pg (ref 26.0–34.0)
MCHC: 27.6 g/dL — ABNORMAL LOW (ref 30.0–36.0)
MCV: 94.4 fL (ref 80.0–100.0)
Monocytes Absolute: 1.1 10*3/uL — ABNORMAL HIGH (ref 0.1–1.0)
Monocytes Relative: 7 %
Neutro Abs: 12.3 10*3/uL — ABNORMAL HIGH (ref 1.7–7.7)
Neutrophils Relative %: 81 %
Platelets: 194 10*3/uL (ref 150–400)
RBC: 3.6 MIL/uL — ABNORMAL LOW (ref 4.22–5.81)
RDW: 15.9 % — ABNORMAL HIGH (ref 11.5–15.5)
WBC: 15.3 10*3/uL — ABNORMAL HIGH (ref 4.0–10.5)
nRBC: 0.3 % — ABNORMAL HIGH (ref 0.0–0.2)

## 2022-02-22 LAB — C-REACTIVE PROTEIN: CRP: 10.5 mg/dL — ABNORMAL HIGH (ref <1.0)

## 2022-02-22 LAB — GLUCOSE, CAPILLARY
Glucose-Capillary: 182 mg/dL — ABNORMAL HIGH (ref 70–99)
Glucose-Capillary: 140 mg/dL — ABNORMAL HIGH (ref 70–99)
Glucose-Capillary: 166 mg/dL — ABNORMAL HIGH (ref 70–99)
Glucose-Capillary: 199 mg/dL — ABNORMAL HIGH (ref 70–99)
Glucose-Capillary: 190 mg/dL — ABNORMAL HIGH (ref 70–99)
Glucose-Capillary: 189 mg/dL — ABNORMAL HIGH (ref 70–99)

## 2022-02-22 LAB — SODIUM
Sodium: 159 mmol/L — ABNORMAL HIGH (ref 135–145)
Sodium: 160 mmol/L — ABNORMAL HIGH (ref 135–145)
Sodium: 160 mmol/L — ABNORMAL HIGH (ref 135–145)

## 2022-02-22 LAB — OSMOLALITY, URINE
Osmolality, Ur: 577 mOsm/kg (ref 300–900)
Osmolality, Ur: 570 mOsm/kg (ref 300–900)
Osmolality, Ur: 568 mOsm/kg (ref 300–900)

## 2022-02-22 LAB — C3 COMPLEMENT: C3 Complement: 165 mg/dL (ref 82–167)

## 2022-02-22 LAB — BASIC METABOLIC PANEL
Anion gap: 9 (ref 5–15)
BUN: 144 mg/dL — ABNORMAL HIGH (ref 6–20)
CO2: 27 mmol/L (ref 22–32)
Calcium: 8.5 mg/dL — ABNORMAL LOW (ref 8.9–10.3)
Chloride: 124 mmol/L — ABNORMAL HIGH (ref 98–111)
Creatinine, Ser: 2.77 mg/dL — ABNORMAL HIGH (ref 0.61–1.24)
GFR, Estimated: 28 mL/min — ABNORMAL LOW (ref >60)
Glucose, Bld: 192 mg/dL — ABNORMAL HIGH (ref 70–99)
Potassium: 5.2 mmol/L — ABNORMAL HIGH (ref 3.5–5.1)
Sodium: 160 mmol/L — ABNORMAL HIGH (ref 135–145)

## 2022-02-22 LAB — MAGNESIUM: Magnesium: 2.9 mg/dL — ABNORMAL HIGH (ref 1.7–2.4)

## 2022-02-22 LAB — PHOSPHORUS: Phosphorus: 4.1 mg/dL (ref 2.5–4.6)

## 2022-02-22 LAB — ANA W/REFLEX IF POSITIVE: Anti Nuclear Antibody (ANA): NEGATIVE

## 2022-02-22 LAB — C4 COMPLEMENT: Complement C4, Body Fluid: 28 mg/dL (ref 12–38)

## 2022-02-22 MED ORDER — DESMOPRESSIN ACETATE 4 MCG/ML IJ SOLN
4.0000 ug | Freq: Once | INTRAMUSCULAR | Status: AC
  Administered 2022-02-22: 4 ug via INTRAVENOUS
  Filled 2022-02-22: qty 1

## 2022-02-22 MED ORDER — SODIUM CHLORIDE 0.9 % IV SOLN: 20.0000 ug | Freq: Once | INTRAVENOUS | Status: DC

## 2022-02-22 NOTE — Progress Notes (Addendum)
NAME:  Samuel Chavez, MRN:  [id number], DOB:  07/10/1976, LOS: 11 ADMISSION DATE:  02/11/2022, CONSULTATION DATE: 02/11/22 REFERRING MD:  Eugenie Norrie MD  CHIEF COMPLAINT: SOB   CC  follow up RESP FAILURE/acute CHF exacerbation  HPI/SYNOPSIS  46 y.o male with significant PMH as below who presented to the ED with chief complaints of SOB and worsening bilateral lower extremities edema.  ED Course: In the emergency department, the temperature was 37.3C, the heart rate 99 beats/minute, the blood pressure 187/102 mm Hg, the respiratory rate 20 breaths/minute, and the oxygen saturation 89% on. He was noted to be drowsy but still protecting his airway and responding appropriately.  Pertinent INITIAL Labs/Diagnostics Findings: Chemistry:Glucose:319 Calcium: 8.8 otherwise unremarkable CBC: unremarkable Other Lab findings: ABG with hypoxemia and hypercapnia, .  Imaging: Chest x-ray showed bilateral infiltrates worse on right  -02/17/22- patient is severely critically ill with bilateral multifocal pneumonia on sedation and paralysis with mechanical ventilation maximal settings.  He is at cusp of death, we met with daughter today and discussed severity of illness. Unable to perform CT due to severity of critical illness. Met with previous PCCM doc and discussed medical plan.  Patient had recruitment maneuvers last few days without improvement, on heparin gtt for possible PE.   02/18/22- patient continues to require maximal settings on ventilator despite good UOP.  He destaturated to <50% spO2 on MV today required BGV.   02/19/22- patient continues to require 100% FiO2 on maximal setting with high PEEP ladder for possible ARDS.  He was diuresed and on steroids with development of AKI.  His CXR had slight interval improvement on right. We discussed case with vascular surgery regarding possible empiric tPA for PE.  02/20/22- patient was unable to get CT today due to continued severe hypoxemia.  Daughter and  other family members at bedside we reviewed case and medical plan.  There seems to be some confusion from family as they had asked me to wake patient up and take off ventilator even though I had explained multiple times that he is at very high risk for death.  They laughed at my comments and seemed to think it was not true. We may still be able to do bronchoscopy but its high risk.  We have reduced IV infusions by switching some medications to OGT route.   02/21/22- patient is weaned to 60%FiO2.  Plan for possible bronch if patient is tolerating.  Family at bedside.  If able to we will obtain more imaging and VQ scan to rule out PE.  02/22/22- Patient weaned to 50% on PRVC.  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 CBC stable , CMP with improved GFR. Monitoring Na, pharmacy and renal following for electrolytes/renal function.   Significant Hospital Events   7/25: Admitted to hospitalist service w/acute on chronic hypoxic hypercapnic resp. failure requiring BiPAP.Failed BiPAP and intubated. PCCM consulted 7/26: INTUBATED, difficlut to sedate, started [place] 7/27: severe hypoxia, PEEP increased to 15 but decreased back to 10 7/28: Persistent severe hypoxia, ventilator changes made 7/29: Persistent hypoxia, recruitment maneuvers instituted, ventilator changes made, empiric heparin as patient cannot be scanned query PE  Consults:  PCCM  Procedures:  7/25: Intubation 7/26: PICC triple-lumen, right basilic vein  Significant Diagnostic Tests:  7/25  Chest Xray:Chest x-ray showed cardiomegaly and pulmonary vascular congestion without overt pulmonary edema 7/25 Echocardiogram: difficult study, LVEF was estimated at 55 to 60%, dilated LV moderate LVH, grade 1 DD, enlargement  of the right ventricle, left atrial size moderately dilated, right atrial size moderately dilated this is consistent with restrictive physiology (echo reading not congruous with prior  echoes from [facility]) 7/30 Limited echo: LVEF 40 to 45% decreased LV function, no wall motion abnormalities, moderate dilation of the LV concentric LVH, right ventricular size moderately enlarged, right atrial enlargement, this is more consistent with prior [facility] scans  Micro Data:  7/25: SARS-CoV-2 PCR> negative 7/25: MRSA PCR>> NEG 7/27: Strep pneumoniae Ag>> negative 7/27: Legionella Ag>> 7/27: Mycoplasma>> 7/27: Sputum>> Strep pneumo,Haemophilus influenzae 7/30 Sputum cult >>  Antimicrobials:  7/27: Maxipime >> 7/27: Vancomycin >> 7/29 7/30: Vancomycin (resumed due to fever spike on Maxipime) ? Resistant Strep pneumo   REVIEW OF SYSTEMS Patient is unable to provide complete review of systems due to severe critical illness/ventilator dependence  OBJECTIVE  Blood pressure 110/61, pulse 94, temperature 99.9 F (37.7 C), temperature source Esophageal, resp. rate (!) 30, height 5' 9.02" (1.753 m), weight (!) 205 kg, SpO2 93 %. CVP:  [15 mmHg-23 mmHg] 23 mmHg   Vent Mode: PRVC FiO2 (%):  [50 %-65 %] 60 % Set Rate:  [30 bmp] 30 bmp Vt Set:  [500 mL] 500 mL PEEP:  [14 cmH20] 14 cmH20 Plateau Pressure:  [28 cmH20] 28 cmH20   Intake/Output Summary (Last 24 hours) at 02/22/2022 1023 Last data filed at 02/22/2022 0800 Gross per 24 hour  Intake 2430.12 ml  Output 5375 ml  Net -2944.88 ml    Filed Weights   02/20/22 0420 02/21/22 0320 02/22/22 0200  Weight: (!) 208 kg (!) 210 kg (!) 205 kg   PHYSICAL EXAMINATION: GENERAL: Morbidly obese man, intubated, mechanically ventilated, sedated.  Malodorous mouth secretions. HEAD: Normocephalic, atraumatic.  EYES: Pupils equal, round, reactive to light.  No scleral icterus.  MOUTH: Macroglossia (massive).  Orotracheally intubated, OG in place. NECK: Supple. No thyromegaly. Trachea midline. No JVD.  No adenopathy. PULMONARY: Good air entry bilaterally.  Occasional rhonchi noted. CARDIOVASCULAR: S1 and S2.  Sinus  tach.  ABDOMEN: Obese, soft, nondistended, tolerating tube feeds. MUSCULOSKELETAL: No joint deformity, no clubbing, no edema.  SCDs in place. NEUROLOGIC: Sedated and paralyzed on ventilator GCS 4T SKIN: Intact,warm,dry.  Chronic stasis changes in the lower extremities. PSYCH: Not assessed due to mechanically ventilated status.  Labs/imaging that I havepersonally reviewed    Labs   CBC: Recent Labs  Lab 02/18/22 0326 02/19/22 0412 02/20/22 0359 02/21/22 0354 02/22/22 0424  WBC 15.3* 12.3* 16.6* 16.2* 15.3*  NEUTROABS 11.9* 10.3* 14.0* 13.7* 12.3*  HGB 10.8* 10.1* 9.7* 9.7* 9.4*  HCT 39.8 37.4* 34.6* 35.3* 34.0*  MCV 96.8 96.6 95.8 95.9 94.4  PLT 172 181 192 192 194     Basic Metabolic Panel: Recent Labs  Lab 02/18/22 0326 02/18/22 1331 02/19/22 0412 02/19/22 0920 02/19/22 1337 02/20/22 0359 02/21/22 0354 02/22/22 0424 02/22/22 0919  NA 151* 151* 151*  --   --  151* 156* 160* 159*  K 5.0 5.3* 5.7* 5.5* 5.8* 5.4* 5.6* 5.2*  --   CL 111 114* 113*  --   --  117* 122* 124*  --   CO2 32 31 30  --   --  $R'27 27 27  'Il$ --   GLUCOSE 177* 222* 246*  --   --  322* 273* 192*  --   BUN 72* 72* 94*  --   --  125* 143* 144*  --   CREATININE 1.88* 1.64* 2.61*  --   --  3.94* 3.52*  2.77*  --   CALCIUM 8.3* 8.4* 8.2*  --   --  8.4* 8.6* 8.5*  --   MG 2.7*  --  2.9*  --   --  3.3* 3.2*  --  2.9*  PHOS 4.1  --  3.4  --   --  4.8* 5.3*  --  4.1    GFR: Estimated Creatinine Clearance: 59.9 mL/min (A) (by C-G formula based on SCr of 2.77 mg/dL (H)). Recent Labs  Lab 02/19/22 0412 02/19/22 0920 02/20/22 0359 02/21/22 0354 02/22/22 0424  PROCALCITON  --  8.62 7.30  --   --   WBC 12.3*  --  16.6* 16.2* 15.3*     Liver Function Tests: No results for input(s): "AST", "ALT", "ALKPHOS", "BILITOT", "PROT", "ALBUMIN" in the last 168 hours.  ABG    Component Value Date/Time   PHART 7.27 (L) 02/20/2022 0432   PCO2ART 66 (HH) 02/20/2022 0432   PO2ART 74 (L) 02/20/2022 0432   HCO3  30.3 (H) 02/20/2022 0432   TCO2 26 03/17/2007 1822   O2SAT 95.5 02/20/2022 0432     Home Medications  Prior to Admission medications   Medication Sig Start Date End Date Taking? Authorizing Provider  losartan (COZAAR) 100 MG tablet Take 100 mg by mouth daily. 02/01/22  Yes [provider]  OZEMPIC, 0.25 OR 0.5 MG/DOSE, 2 MG/3ML SOPN SMARTSIG:0.25 Milligram(s) SUB-Q Once a Week 12/09/21  Yes [provider]  torsemide (DEMADEX) 20 MG tablet Take 20 mg by mouth daily. 12/18/21  Yes [provider]  Aspirin-Salicylamide-Caffeine (BC HEADACHE POWDER PO) Take 1 packet by mouth as needed (for pain).    [provider]  cephALEXin (KEFLEX) 500 MG capsule Take 1 capsule (500 mg total) by mouth 4 (four) times daily. Patient not taking: Reported on 02/11/2022 12/16/18   Davonna Belling, MD  cyclobenzaprine (FLEXERIL) 10 MG tablet Take 0.5-1 tablets (5-10 mg total) by mouth 2 (two) times daily as needed for muscle spasms. Patient not taking: Reported on 02/11/2022 03/12/18   Margarita Mail, PA-C  gabapentin (NEURONTIN) 600 MG tablet Take 600 mg by mouth at bedtime. Patient not taking: Reported on 02/11/2022 10/13/21   [provider]  LABETALOL HCL PO Take by mouth. Patient not taking: Reported on 02/11/2022    [provider]  meloxicam (MOBIC) 15 MG tablet Take 1 tablet (15 mg total) by mouth daily. Take 1 daily with food. Patient not taking: Reported on 02/11/2022 03/12/18   Margarita Mail, PA-C  metFORMIN (GLUCOPHAGE) 500 MG tablet Take 500 mg by mouth 2 (two) times daily with a meal. Patient not taking: Reported on 02/11/2022    [provider]  METFORMIN HCL ER, MOD, PO Take by mouth. Patient not taking: Reported on 02/11/2022    [provider]  METOPROLOL SUCCINATE ER PO Take by mouth. Patient not taking: Reported on 02/11/2022    [provider]  oxyCODONE-acetaminophen (PERCOCET) 10-325 MG tablet Take 1 tablet by mouth  See admin instructions. Take 1 tablet by mouth 4-5 times a day as needed for pain Patient not taking: Reported on 02/11/2022    [provider]  oxyCODONE-acetaminophen (PERCOCET/ROXICET) 5-325 MG tablet Take 1 tablet by mouth every 8 (eight) hours as needed for severe pain. Patient not taking: Reported on 02/11/2022 12/16/18   Davonna Belling, MD  predniSONE (DELTASONE) 20 MG tablet Take 2 tablets (40 mg total) by mouth daily. Patient not taking: Reported on 02/11/2022 04/04/15   Davonna Belling, [facility]  Scheduled Meds:  artificial tears  1 Application Both Eyes F1W   Chlorhexidine Gluconate Cloth  6 each Topical Q0600   dexamethasone (DECADRON) injection  4 mg Intravenous Q12H   feeding supplement (PROSource TF)  90 mL Per Tube 5 X Daily   free water  250 mL Per Tube Q2H   heparin injection (subcutaneous)  5,000 Units Subcutaneous Q8H   insulin aspart  0-20 Units Subcutaneous Q4H   insulin aspart  8 Units Subcutaneous Q4H   insulin glargine-yfgn  20 Units Subcutaneous BID   insulin starter kit- pen needles  1 kit Other Once   ipratropium-albuterol  3 mL Nebulization Q6H   living well with diabetes book   Does not apply Once   multivitamin  1 tablet Per Tube QHS   mouth rinse  15 mL Mouth Rinse Q2H   oxyCODONE  10 mg Per Tube Q6H   pantoprazole sodium  40 mg Per Tube QHS   patiromer  16.8 g Oral Daily   phenobarbital  97.2 mg Per Tube TID   polyethylene glycol  17 g Per Tube Daily   senna-docusate  1 tablet Per Tube BID   sodium chloride flush  10-40 mL Intracatheter Q12H   sodium chloride flush  3 mL Intravenous Q12H   Continuous Infusions:  sodium chloride 250 mL (02/19/22 1011)   sodium chloride Stopped (02/11/22 2011)   feeding supplement (VITAL 1.5 CAL) 45 mL/hr at 02/22/22 0800   fentaNYL infusion INTRAVENOUS 200 mcg/hr (02/22/22 0800)   midazolam 6 mg/hr (02/22/22 1005)   PRN Meds:.sodium chloride, acetaminophen **OR** acetaminophen, docusate sodium,  fentaNYL, midazolam, ondansetron **OR** ondansetron (ZOFRAN) IV, mouth rinse, polyethylene glycol, sodium chloride flush, sodium chloride flush, traZODone, vecuronium      Active Hospital Problem list    Patient Active Problem List   Diagnosis Date Noted   Acute respiratory failure ([place]) 02/11/2022   Respiratory failure ([place]) 02/11/2022   Acute respiratory failure with hypoxia and hypercarbia ([facility]) 02/11/2022   Acute on chronic systolic CHF (congestive heart failure) ([place]) 02/11/2022   Hypertensive urgency 02/11/2022   Type 2 diabetes mellitus with peripheral neuropathy ([place]) 02/11/2022   OBSTRUCTIVE SLEEP APNEA 04/30/2009    Assessment & Plan:   46 yo morbidly obese AAM with severe decompensated acute systolic and diastolic heart failure with previously documented severe OSA/OHS, noncompliant with CPAP, active smoker, leading to severe hypoxic resp failure with encephalopathy and difficulty with sedation  Severe acute on chronic hypoxic and hypercapnic respiratory failure due to CAP and acute decompensation of chronic heart failure Persistent hypoxemia despite trial of several ventilator strategies continue Mechanical Ventilator support Wean Fio2 and PEEP as tolerated Continue VAP/VENT bundles  Wean PEEP & FiO2 as tolerated, maintain SpO2 > 88% Head of bed elevated 30 degrees, VAP protocol in place Plateau pressures less than 30 cm H20 as able Intermittent chest x-ray & ABG PRN Ensure adequate pulmonary hygiene  will NOT perform SAT/SBT due to high FiO2 requirements, asynchrony off sedation  Requiring paralytic infusion for ventilator synchrony Recruitment maneuvers every 4h and as needed. On heparin infusion as cannot exclude PE, however no DVT on LE Dopplers assuring, Patient cannot be proned, due to weight limit for safe proning and unstable airway (ET tube with constant need of repositioning)   Community-acquired pneumonia: Streptococcus pneumoniae and Haemophilus  influenzae Fever spike today Sputum re-cultured Changed antibiotics to ceftaz    Acute on chronic systolic and diastolic heart failure LVEF 40 to 45% consistent with prior studies performed  at [place] ventilator support 2D Echocardiogram shows diastolic dysfunction and enlarged left and right atria consistent with restrictive physiology  ACUTE KIDNEY INJURY/Renal Failure -STAGE 4 -continues to improve Element of cardiorenal syndrome -continue Foley catheter-assess need -Avoid nephrotoxic agents -Follow urine output, BMP -Ensure adequate renal perfusion, optimize oxygenation -Renal dose medications -Nephrology- on case - renal function improving -NON OLIGURIC    SEVERE HYPERNATREMIA    - 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        Intake/Output Summary (Last 24 hours) at 02/22/2022 1023 Last data filed at 02/22/2022 0800 Gross per 24 hour  Intake 2430.12 ml  Output 5375 ml  Net -2944.88 ml        Latest Ref Rng & Units 02/22/2022    9:19 AM 02/22/2022    4:24 AM 02/21/2022    3:54 AM  BMP  Glucose 70 - 99 mg/dL  192  273   BUN 6 - 20 mg/dL  144  143   Creatinine 0.61 - 1.24 mg/dL  2.77  3.52   Sodium 135 - 145 mmol/L 159  160  156   Potassium 3.5 - 5.1 mmol/L  5.2  5.6   Chloride 98 - 111 mmol/L  124  122   CO2 22 - 32 mmol/L  27  27   Calcium 8.9 - 10.3 mg/dL  8.5  8.6     ENDO - ICU hypoglycemic\Hyperglycemia protocol -check FSBS per protocol -Diabetes mellitus -HgbA1c  10.1 -CBG's AC & hs; Target range of 140 to 180 -SSI -Follow ICU Hypo/Hyperglycemia protocol -Hold Metformin  GI GI PROPHYLAXIS PPI  NUTRITIONAL STATUS DIET-->TF's as tolerated Constipation protocol as indicated  ACUTE ANEMIA- Transfuse as needed Consider transfusion  if hgb<7  Diet: Tube feeds Pain/Anxiety/Delirium protocol (if indicated): Yes (RASS goal 0) VAP  protocol (if indicated): Yes DVT prophylaxis: Systemic AC GI prophylaxis: PPI Glucose control:  SSI Yes Central venous access:  N/A Arterial line:  N/A Foley:  Yes, and it is still needed Mobility:  bed rest  PT consulted: N/A Last date of multidisciplinary goals of care discussion [7/25] Code Status:  full code Disposition: ICU     IMAGING    -CT CHEST WITH MULTIFOCAL BILATERAL PNEUMONIA    Critical care provider statement:   Total critical care time: 33 minutes   Performed by: Lanney Gins MD   Critical care time was exclusive of separately billable procedures and treating other patients.   Critical care was necessary to treat or prevent imminent or life-threatening deterioration.   Critical care was time spent personally by me on the following activities: development of treatment plan with patient and/or surrogate as well as nursing, discussions with consultants, evaluation of patient's response to treatment, examination of patient, obtaining history from patient or surrogate, ordering and performing treatments and interventions, ordering and review of laboratory studies, ordering and review of radiographic studies, pulse oximetry and re-evaluation of patient's condition.    Ottie Glazier, M.D.  Pulmonary & Critical Care Medicine

## 2022-02-22 NOTE — Progress Notes (Signed)
Patient with notable coughing episodes. Et tube pushed out to 24 cm at lip.  Repositioned tube back to 27cm per documentation 8/3. BBS equal. Patient tolerated interventions well

## 2022-02-22 NOTE — Progress Notes (Signed)
[place], [place] 02/22/22  Subjective:   Hospital day # 11  [name] is a 46 y.o.  male with past medical history of diabetes, hypertension, sCHF, OSA, and obesity, who was admitted to [facility] on 02/11/2022 for Acute respiratory failure ([place]) [J96.00] Respiratory failure ([place]) [J96.90] Peripheral edema [R60.9] Acute respiratory failure with hypoxia and hypercapnia ([place]) [J96.01, J96.02]  We have been re-consulted for elevated renal function. We were consulted during the initial admission for acute kidney injury which was corrected with IV Furosemide. Creatinine began to increase on 02/18/22. No exposure to IV contrast, hypotension, or nephrotoxic medications. Urine output remains adequate, 2.1L yesterday. No edema.   Sedated on Fentanyl and versed No pressors Vent- weaned to 60 % FiO2  Heparin drip Tube feeds remain 61m/hr Foley and flexiseal placed Cooling blanket in use No edema  Renal: 08/04 0701 - 08/05 0700 In: 2159.2 [I.V.:749.4; NBO/FB:5102 IV Piggyback:194.8] Out: 4825 [Urine:2725; Stool:2100] Lab Results  Component Value Date   CREATININE 2.77 (H) 02/22/2022   CREATININE 3.52 (H) 02/21/2022   CREATININE 3.94 (H) 02/20/2022     Objective:  Vital signs in last 24 hours:  Temp:  [96.8 F (36 C)-101.1 F (38.4 C)] 99.9 F (37.7 C) (08/05 0800) Pulse Rate:  [88-96] 94 (08/05 0800) Resp:  [0-30] 0 (08/05 1100) BP: (102-124)/(55-76) 111/60 (08/05 1100) SpO2:  [90 %-96 %] 93 % (08/05 0800) Arterial Line BP: (96-131)/(53-66) 111/58 (08/05 1100) FiO2 (%):  [50 %-65 %] 60 % (08/05 0800) Weight:  [205 kg] 205 kg (08/05 0200)  Weight change: -5 kg Filed Weights   02/20/22 0420 02/21/22 0320 02/22/22 0200  Weight: (!) 208 kg (!) 210 kg (!) 205 kg    Intake/Output:    Intake/Output Summary (Last 24 hours) at 02/22/2022 1335 Last data filed at 02/22/2022 1200 Gross per 24 hour  Intake 2572.08 ml  Output 6025 ml  Net -3452.92  ml      Physical Exam: General: Critically ill-appearing, laying in the bed  HEENT ET tube, OG tube in place  Pulm/lungs Ventilator assisted, coarse breath sounds  CVS/Heart Regular  Abdomen:  Soft, nontender, nondistended  Extremities: Trace dependent edema  Neurologic: Sedated  Skin: Warm, dry  Access:        Basic Metabolic Panel:  Recent Labs  Lab 02/18/22 0326 02/18/22 1331 02/19/22 0412 02/19/22 0920 02/19/22 1337 02/20/22 0359 02/21/22 0354 02/22/22 0424 02/22/22 0919  NA 151* 151* 151*  --   --  151* 156* 160* 159*  K 5.0 5.3* 5.7* 5.5* 5.8* 5.4* 5.6* 5.2*  --   CL 111 114* 113*  --   --  117* 122* 124*  --   CO2 32 31 30  --   --  _0 --   GLUCOSE 177* 222* 246*  --   --  322* 273* 192*  --   BUN 72* 72* 94*  --   --  125* 143* 144*  --   CREATININE 1.88* 1.64* 2.61*  --   --  3.94* 3.52* 2.77*  --   CALCIUM 8.3* 8.4* 8.2*  --   --  8.4* 8.6* 8.5*  --   MG 2.7*  --  2.9*  --   --  3.3* 3.2*  --  2.9*  PHOS 4.1  --  3.4  --   --  4.8* 5.3*  --  4.1      CBC: Recent Labs  Lab 02/18/22 0326 02/19/22 0412  02/20/22 0359 02/21/22 0354 02/22/22 0424  WBC 15.3* 12.3* 16.6* 16.2* 15.3*  NEUTROABS 11.9* 10.3* 14.0* 13.7* 12.3*  HGB 10.8* 10.1* 9.7* 9.7* 9.4*  HCT 39.8 37.4* 34.6* 35.3* 34.0*  MCV 96.8 96.6 95.8 95.9 94.4  PLT 172 181 192 192 194      No results found for: "HEPBSAG", "HEPBSAB", "HEPBIGM"    Microbiology:  Recent Results (from the past 240 hour(s))  Culture, Respiratory w Gram Stain     Status: None   Collection Time: 02/13/22  9:16 PM   Specimen: Tracheal Aspirate; Respiratory  Result Value Ref Range Status   Specimen Description   Final    TRACHEAL ASPIRATE Performed at [facility], [address], [place], [place] [address]    Special Requests   Final    NONE Performed at [facility], [address] [place], [place] [address]    Gram Stain   Final    NO SQUAMOUS EPITHELIAL CELLS SEEN FEW  WBC SEEN FEW GRAM NEGATIVE RODS MODERATE GRAM POSITIVE COCCI    Culture   Final    ABUNDANT STREPTOCOCCUS PNEUMONIAE ABUNDANT HAEMOPHILUS INFLUENZAE BETA LACTAMASE NEGATIVE NO STAPHYLOCOCCUS AUREUS ISOLATED No Pseudomonas species isolated Performed at [facility], [place] [address] [place], [place] [address]    Report Status 02/17/2022 FINAL  Final   Organism ID, Bacteria STREPTOCOCCUS PNEUMONIAE  Final      Susceptibility   Streptococcus pneumoniae - MIC*    ERYTHROMYCIN <=0.12 SENSITIVE Sensitive     LEVOFLOXACIN 0.5 SENSITIVE Sensitive     VANCOMYCIN 0.5 SENSITIVE Sensitive     PENICILLIN (meningitis) <=0.06 SENSITIVE Sensitive     PENO - penicillin <=0.06      PENICILLIN (non-meningitis) <=0.06 SENSITIVE Sensitive     PENICILLIN (oral) <=0.06 SENSITIVE Sensitive     CEFTRIAXONE (non-meningitis) <=0.12 SENSITIVE Sensitive     CEFTRIAXONE (meningitis) <=0.12 SENSITIVE Sensitive     * ABUNDANT STREPTOCOCCUS PNEUMONIAE  Culture, Respiratory w Gram Stain     Status: None   Collection Time: 02/16/22  4:27 PM   Specimen: Tracheal Aspirate; Respiratory  Result Value Ref Range Status   Specimen Description   Final    TRACHEAL ASPIRATE Performed at [facility], [address] [place], [place] [address]    Special Requests   Final    NONE Performed at [facility], [address] [place], [place] [address]    Gram Stain   Final    RARE WBC PRESENT,BOTH PMN AND MONONUCLEAR NO ORGANISMS SEEN    Culture   Final    NO GROWTH Performed at [facility], [place] [address], [place], [place] [address]    Report Status 02/20/2022 FINAL  Final  Culture, Respiratory w Gram Stain     Status: None (Preliminary result)   Collection Time: 02/21/22 11:41 AM   Specimen: Bronchoalveolar Lavage; Respiratory  Result Value Ref Range Status   Specimen Description   Final    BRONCHIAL ALVEOLAR LAVAGE Performed at [facility], [address]  [place], [place] [address]    Special Requests   Final    NONE Performed at [facility], [place], [place] [address]    Gram Stain   Final    FEW WBC PRESENT,BOTH PMN AND MONONUCLEAR NO ORGANISMS SEEN    Culture   Final    NO GROWTH < 24 HOURS Performed at [facility], [place] [address], [place], [place] [address]  Report Status PENDING  Incomplete    Coagulation Studies: No results for input(s): "LABPROT", "INR" in the last 72 hours.  Urinalysis: No results for input(s): "COLORURINE", "LABSPEC", "PHURINE", "GLUCOSEU", "HGBUR", "BILIRUBINUR", "KETONESUR", "PROTEINUR", "UROBILINOGEN", "NITRITE", "LEUKOCYTESUR" in the last 72 hours.  Invalid input(s): "APPERANCEUR"    Imaging: CT CHEST ABDOMEN PELVIS WO CONTRAST  Result Date: 02/21/2022 CLINICAL DATA:  Sepsis EXAM: CT CHEST, ABDOMEN AND PELVIS WITHOUT CONTRAST TECHNIQUE: Multidetector CT imaging of the chest, abdomen and pelvis was performed following the standard protocol without IV contrast. RADIATION DOSE REDUCTION: This exam was performed according to the departmental dose-optimization program which includes automated exposure control, adjustment of the mA and/or kV according to patient size and/or use of iterative reconstruction technique. COMPARISON:  Previous studies including chest radiograph done earlier today, CT chest done on 06/04/2020 FINDINGS: CT CHEST FINDINGS Cardiovascular: Heart is enlarged in size. There is ectasia of main pulmonary artery measuring 4.2 cm suggesting pulmonary arterial hypertension. Mediastinum/Nodes: There are slightly enlarged lymph nodes in mediastinum largest in the precarinal region measuring 1.3 cm in short axis. Tip of endotracheal tube is at the level of aortic arch. Enteric tube is noted traversing the esophagus. Tip of right PICC line is seen in superior vena cava. Lungs/Pleura: Patchy ground-glass and alveolar infiltrates are seen in both lungs. Dense infiltrates are  seen in the posterior aspects of both lower lung fields with air bronchograms. Small pleural effusions are seen. There is no pneumothorax. Musculoskeletal: No acute abnormalities are seen in the bony structures in the thorax. CT ABDOMEN PELVIS FINDINGS Hepatobiliary: No focal abnormalities are seen in liver. There is no dilation of bile ducts. Surgical clips are seen in gallbladder fossa. Pancreas: No focal abnormalities are seen. Spleen: Spleen measures 13.9 cm in maximum diameter. Adrenals/Urinary Tract: Adrenals are unremarkable. There is no hydronephrosis. There are no renal or ureteral stones. Foley catheter is seen in the bladder. There is no significant wall thickening in the bladder. Stomach/Bowel: Tip of NG tube is seen in the antrum of the stomach. There is contrast in the lumen of stomach and few small bowel loops. Small bowel loops are not dilated. The appendix is not dilated. There is no significant wall thickening in colon. There is no pericolic stranding. Few diverticula seen in the colon without signs of focal diverticulitis. Vascular/Lymphatic: Scattered arterial calcifications are seen. Reproductive: Coarse calcifications are seen prostate. Other: There is no ascites or pneumoperitoneum. Musculoskeletal: Severe degenerative changes are noted at the L4-L5 level in lumbar spine with significant interval worsening in comparison with the study of 01/28/2016. IMPRESSION: Extensive ground-glass, alveolar and patchy dense infiltrates in both lungs suggesting multifocal pneumonia. Part of this finding may suggest underlying pulmonary edema. Small bilateral pleural effusions are seen. Cardiomegaly. There is ectasia of the main pulmonary artery suggesting pulmonary arterial hypertension. There is no evidence of intestinal obstruction or pneumoperitoneum. There is no hydronephrosis. Appendix is not dilated. UB diverticula are seen in the colon without signs of focal diverticulitis. Severe degenerative changes  are noted at L4-L5 level with significant interval worsening. Findings may be due to severe disc degeneration. If there is clinical suspicion for discitis or osteomyelitis, follow-up MRI may be considered. Electronically Signed   By: Elmer Picker M.D.   On: 02/21/2022 16:17   CT HEAD WO CONTRAST (5MM)  Result Date: 02/21/2022 CLINICAL DATA:  Mental status change EXAM: CT HEAD WITHOUT CONTRAST TECHNIQUE: Contiguous axial images were obtained from the base of the skull through the vertex without intravenous contrast.  RADIATION DOSE REDUCTION: This exam was performed according to the departmental dose-optimization program which includes automated exposure control, adjustment of the mA and/or kV according to patient size and/or use of iterative reconstruction technique. COMPARISON:  Head CT dated December 26, 2020 FINDINGS: Brain: No evidence of acute infarction, hemorrhage, hydrocephalus, extra-axial collection or mass lesion/mass effect. Vascular: No hyperdense vessel or unexpected calcification. Skull: Normal. Negative for fracture or focal lesion. Sinuses/Orbits: Diffuse wall thickening of the paranasal sinuses with air-fluid level seen in the bilateral maxillary and left frontal sinus. Bilateral complete opacification of the mastoid air cells and inner ears. Other: None. IMPRESSION: 1. No acute intracranial abnormality. 2. Paranasal sinusitis. 3. Bilateral complete opacification of the mastoid air cells and inner ears, as can be seen in the setting of otitis media. Correlate with symptoms. Electronically Signed   By: Yetta Glassman M.D.   On: 02/21/2022 16:04   NM Pulmonary Perfusion  Result Date: 02/21/2022 CLINICAL DATA:  Shortness of breath, BILATERAL lower extremity edema EXAM: NUCLEAR MEDICINE PERFUSION LUNG SCAN TECHNIQUE: Perfusion images were obtained in multiple projections after intravenous injection of radiopharmaceutical. Ventilation scans intentionally deferred if perfusion scan and chest  x-ray adequate for interpretation during COVID 19 epidemic. RADIOPHARMACEUTICALS:  4.35 mCi Tc-75m MAA IV COMPARISON:  Chest radiograph 02/21/2022 FINDINGS: Enlargement of cardiac silhouette. Small subsegmental perfusion defect lateral LEFT lower lobe corresponding to dense atelectasis versus consolidation on chest radiograph. No additional perfusion defects identified. IMPRESSION: Pulmonary embolism absent. Electronically Signed   By: Lavonia Dana M.D.   On: 02/21/2022 15:56   DG Chest Port 1 View  Result Date: 02/21/2022 CLINICAL DATA:  Acute respiratory failure with hypoxia EXAM: PORTABLE CHEST 1 VIEW COMPARISON:  Yesterday FINDINGS: Endotracheal tube tip appears to project just at the lower margin of the clavicular heads. An enteric tube reaches the stomach. Right PICC with tip at distal SVC. Unchanged cardiomegaly and diffuse pulmonary opacity. No visible effusion or pneumothorax IMPRESSION: 1. Unremarkable hardware positioning. 2. Unchanged diffuse airspace disease. Electronically Signed   By: Jorje Guild M.D.   On: 02/21/2022 06:14     Medications:    sodium chloride 250 mL (02/19/22 1011)   sodium chloride Stopped (02/11/22 2011)   feeding supplement (VITAL 1.5 CAL) 45 mL/hr at 02/22/22 1000   fentaNYL infusion INTRAVENOUS 200 mcg/hr (02/22/22 1000)   midazolam 6 mg/hr (02/22/22 1005)    artificial tears  1 Application Both Eyes C9O   Chlorhexidine Gluconate Cloth  6 each Topical Q0600   dexamethasone (DECADRON) injection  4 mg Intravenous Q12H   feeding supplement (PROSource TF)  90 mL Per Tube 5 X Daily   free water  250 mL Per Tube Q2H   heparin injection (subcutaneous)  5,000 Units Subcutaneous Q8H   insulin aspart  0-20 Units Subcutaneous Q4H   insulin aspart  8 Units Subcutaneous Q4H   insulin glargine-yfgn  20 Units Subcutaneous BID   insulin starter kit- pen needles  1 kit Other Once   ipratropium-albuterol  3 mL Nebulization Q6H   living well with diabetes book   Does not  apply Once   multivitamin  1 tablet Per Tube QHS   mouth rinse  15 mL Mouth Rinse Q2H   oxyCODONE  10 mg Per Tube Q6H   pantoprazole sodium  40 mg Per Tube QHS   patiromer  16.8 g Oral Daily   phenobarbital  97.2 mg Per Tube TID   polyethylene glycol  17 g Per Tube Daily   senna-docusate  1 tablet Per Tube BID   sodium chloride flush  10-40 mL Intracatheter Q12H   sodium chloride flush  3 mL Intravenous Q12H   sodium chloride, acetaminophen **OR** acetaminophen, docusate sodium, fentaNYL, midazolam, ondansetron **OR** ondansetron (ZOFRAN) IV, mouth rinse, polyethylene glycol, sodium chloride flush, sodium chloride flush, traZODone, vecuronium  Assessment/ Plan:  46 y.o. male with medical problems of poorly controlled diabetes, hypertension, systolic CHF, obstructive sleep apnea, obesity    admitted on 02/11/2022 for Acute respiratory failure ([facility]) [J96.00] Respiratory failure ([place]) [J96.90] Peripheral edema [R60.9] Acute respiratory failure with hypoxia and hypercapnia ([place]) [J96.01, J96.02]  #Acute kidney injury with hyperkalemia/hypernatremia.  Baseline creatinine 0.96 from February 11, 2022. Suspected secondary to fever, hypotension and sepsis. No IV contrast exposure or nephrotoxic agents. CK 663,   Potassium elevated to 5.2 with elevated sodium, 160. Suggest increasing flushes to 296m every 2 hours to manage sodium. Continue to lokelma for potassium. Patient made great urine output, 2.1L Will hold dialysis for now and continue to monitor.    #Acute respiratory failure Ventilator assisted.  Management as per ICU team.  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     LOS: 11 Candise Crabtree P Finley Chevez 8/5/20231:35 PM  [facility] [place], [place]

## 2022-02-22 NOTE — Consult Note (Addendum)
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  If first dose of DDAVP did not illicit appropriate response and was not at top of appropriate range consider increasing repeat dose (dose range: 0.5-71mcg)   Benita Gutter 02/22/22

## 2022-02-23 ENCOUNTER — Inpatient Hospital Stay: Payer: Medicaid Other

## 2022-02-23 LAB — BLOOD GAS, ARTERIAL
Acid-Base Excess: 2.2 mmol/L — ABNORMAL HIGH (ref 0.0–2.0)
Acid-Base Excess: 3.6 mmol/L — ABNORMAL HIGH (ref 0.0–2.0)
Bicarbonate: 29.1 mmol/L — ABNORMAL HIGH (ref 20.0–28.0)
Bicarbonate: 31.1 mmol/L — ABNORMAL HIGH (ref 20.0–28.0)
Drawn by: 51910
FIO2: 40 %
FIO2: 40 %
MECHVT: 500 mL
MECHVT: 500 mL
Mechanical Rate: 30
Mechanical Rate: 30
O2 Saturation: 79 %
O2 Saturation: 82.6 %
PEEP: 12 cmH2O
PEEP: 14 cmH2O
Patient temperature: 37
Patient temperature: 37
pCO2 arterial: 54 mmHg — ABNORMAL HIGH (ref 32–48)
pCO2 arterial: 59 mmHg — ABNORMAL HIGH (ref 32–48)
pH, Arterial: 7.34 — ABNORMAL LOW (ref 7.35–7.45)
pH, Arterial: 7.33 — ABNORMAL LOW (ref 7.35–7.45)
pO2, Arterial: 51 mmHg — ABNORMAL LOW (ref 83–108)
pO2, Arterial: 53 mmHg — ABNORMAL LOW (ref 83–108)

## 2022-02-23 LAB — CBC WITH DIFFERENTIAL/PLATELET
Abs Immature Granulocytes: 0.18 10*3/uL — ABNORMAL HIGH (ref 0.00–0.07)
Basophils Absolute: 0 10*3/uL (ref 0.0–0.1)
Basophils Relative: 0 %
Eosinophils Absolute: 0.2 10*3/uL (ref 0.0–0.5)
Eosinophils Relative: 1 %
HCT: 33.8 % — ABNORMAL LOW (ref 39.0–52.0)
Hemoglobin: 9.4 g/dL — ABNORMAL LOW (ref 13.0–17.0)
Immature Granulocytes: 1 %
Lymphocytes Relative: 9 %
Lymphs Abs: 1.4 10*3/uL (ref 0.7–4.0)
MCH: 26.6 pg (ref 26.0–34.0)
MCHC: 27.8 g/dL — ABNORMAL LOW (ref 30.0–36.0)
MCV: 95.8 fL (ref 80.0–100.0)
Monocytes Absolute: 0.9 10*3/uL (ref 0.1–1.0)
Monocytes Relative: 5 %
Neutro Abs: 13.3 10*3/uL — ABNORMAL HIGH (ref 1.7–7.7)
Neutrophils Relative %: 84 %
Platelets: 180 10*3/uL (ref 150–400)
RBC: 3.53 MIL/uL — ABNORMAL LOW (ref 4.22–5.81)
RDW: 15.9 % — ABNORMAL HIGH (ref 11.5–15.5)
WBC: 16 10*3/uL — ABNORMAL HIGH (ref 4.0–10.5)
nRBC: 0.3 % — ABNORMAL HIGH (ref 0.0–0.2)

## 2022-02-23 LAB — BASIC METABOLIC PANEL
Anion gap: 6 (ref 5–15)
BUN: 129 mg/dL — ABNORMAL HIGH (ref 6–20)
CO2: 28 mmol/L (ref 22–32)
Calcium: 8.3 mg/dL — ABNORMAL LOW (ref 8.9–10.3)
Chloride: 125 mmol/L — ABNORMAL HIGH (ref 98–111)
Creatinine, Ser: 2.27 mg/dL — ABNORMAL HIGH (ref 0.61–1.24)
GFR, Estimated: 36 mL/min — ABNORMAL LOW (ref >60)
Glucose, Bld: 175 mg/dL — ABNORMAL HIGH (ref 70–99)
Potassium: 4.9 mmol/L (ref 3.5–5.1)
Sodium: 159 mmol/L — ABNORMAL HIGH (ref 135–145)

## 2022-02-23 LAB — CULTURE, RESPIRATORY W GRAM STAIN: Culture: NO GROWTH

## 2022-02-23 LAB — GLUCOSE, CAPILLARY
Glucose-Capillary: 151 mg/dL — ABNORMAL HIGH (ref 70–99)
Glucose-Capillary: 122 mg/dL — ABNORMAL HIGH (ref 70–99)
Glucose-Capillary: 157 mg/dL — ABNORMAL HIGH (ref 70–99)
Glucose-Capillary: 253 mg/dL — ABNORMAL HIGH (ref 70–99)
Glucose-Capillary: 219 mg/dL — ABNORMAL HIGH (ref 70–99)

## 2022-02-23 LAB — SODIUM: Sodium: 157 mmol/L — ABNORMAL HIGH (ref 135–145)

## 2022-02-23 LAB — PHOSPHORUS: Phosphorus: 4.6 mg/dL (ref 2.5–4.6)

## 2022-02-23 LAB — OSMOLALITY, URINE: Osmolality, Ur: 573 mOsm/kg (ref 300–900)

## 2022-02-23 LAB — C-REACTIVE PROTEIN: CRP: 16.4 mg/dL — ABNORMAL HIGH (ref <1.0)

## 2022-02-23 LAB — MAGNESIUM: Magnesium: 3 mg/dL — ABNORMAL HIGH (ref 1.7–2.4)

## 2022-02-23 MED ORDER — SODIUM CHLORIDE 0.9 % IV SOLN
2.0000 mg/h | INTRAVENOUS | Status: DC
  Administered 2022-02-23 – 2022-02-24 (×3): 6 mg/h via INTRAVENOUS
  Filled 2022-02-23 (×2): qty 10

## 2022-02-23 MED ORDER — DEXTROSE 5 % IV SOLN: INTRAVENOUS | Status: DC

## 2022-02-23 MED ORDER — DESMOPRESSIN ACETATE 4 MCG/ML IJ SOLN
4.0000 ug | Freq: Once | INTRAMUSCULAR | Status: AC
  Administered 2022-02-23: 4 ug via INTRAVENOUS
  Filled 2022-02-23: qty 1

## 2022-02-23 MED ORDER — INSULIN GLARGINE-YFGN 100 UNIT/ML ~~LOC~~ SOLN
25.0000 [IU] | Freq: Two times a day (BID) | SUBCUTANEOUS | Status: DC
  Administered 2022-02-23 – 2022-02-24 (×2): 25 [IU] via SUBCUTANEOUS
  Filled 2022-02-23 (×3): qty 0.25

## 2022-02-23 MED ORDER — DEXAMETHASONE SODIUM PHOSPHATE 10 MG/ML IJ SOLN
8.0000 mg | Freq: Two times a day (BID) | INTRAMUSCULAR | Status: DC
  Administered 2022-02-23 – 2022-02-24 (×3): 8 mg via INTRAVENOUS
  Filled 2022-02-23 (×3): qty 0.8

## 2022-02-23 NOTE — Progress Notes (Addendum)
[place], [place] 02/23/22  Subjective:   Hospital day # 12  [name] is a 46 y.o.  male with past medical history of diabetes, hypertension, sCHF, OSA, and obesity, who was admitted to [facility] on 02/11/2022 for Acute respiratory failure ([place]) [J96.00] Respiratory failure ([place]) [J96.90] Peripheral edema [R60.9] Acute respiratory failure with hypoxia and hypercapnia ([place]) [J96.01, J96.02]  We have been re-consulted for elevated renal function. We were consulted during the initial admission for acute kidney injury which was corrected with IV Furosemide. Creatinine began to increase on 02/18/22. No exposure to IV contrast, hypotension, or nephrotoxic medications. Urine output remains adequate,3.3L UOP.  No edema.   Sedated on Fentanyl and versed No pressors Vent- weaned to 40 % FiO2  Heparin drip Tube feeds remain 74m/hr Foley and flexiseal placed No edema  Renal: 08/05 0701 - 08/06 0700 In: 7564.2 [I.V.:355.9; NG/GT:7208.3] Out: 3325 [Urine:3325] Lab Results  Component Value Date   CREATININE 2.27 (H) 02/23/2022   CREATININE 2.77 (H) 02/22/2022   CREATININE 3.52 (H) 02/21/2022     Objective:  Vital signs in last 24 hours:  Temp:  [98 F (36.7 C)-100.5 F (38.1 C)] 99 F (37.2 C) (08/06 1200) Pulse Rate:  [50-99] 91 (08/06 1200) Resp:  [12-30] 30 (08/06 1200) BP: (104-126)/(54-67) 112/63 (08/06 1200) SpO2:  [88 %-100 %] 92 % (08/06 1311) Arterial Line BP: (72-125)/(55-75) 109/58 (08/06 1200) FiO2 (%):  [40 %-55 %] 40 % (08/06 1311) Weight:  [205.4 kg] 205.4 kg (08/06 0500)  Weight change: 0.4 kg Filed Weights   02/21/22 0320 02/22/22 0200 02/23/22 0500  Weight: (!) 210 kg (!) 205 kg (!) 205.4 kg    Intake/Output:    Intake/Output Summary (Last 24 hours) at 02/23/2022 1339 Last data filed at 02/23/2022 1000 Gross per 24 hour  Intake 7876.82 ml  Output 3000 ml  Net 4876.82 ml      Physical Exam: General: Critically  ill-appearing, laying in the bed  HEENT ET tube, OG tube in place  Pulm/lungs Ventilator assisted, coarse breath sounds  CVS/Heart Regular  Abdomen:  Soft, nontender, nondistended  Extremities: Trace dependent edema  Neurologic: Sedated  Skin: Warm, dry  Access:        Basic Metabolic Panel:  Recent Labs  Lab 02/19/22 0412 02/19/22 0920 02/19/22 1337 02/20/22 0359 02/21/22 0354 02/22/22 0424 02/22/22 0919 02/22/22 1450 02/22/22 2038 02/23/22 0317  NA 151*  --   --  151* 156* 160* 159* 160* 160* 159*  K 5.7*   < > 5.8* 5.4* 5.6* 5.2*  --   --   --  4.9  CL 113*  --   --  117* 122* 124*  --   --   --  125*  CO2 30  --   --  _0 --   --   --  28  GLUCOSE 246*  --   --  322* 273* 192*  --   --   --  175*  BUN 94*  --   --  125* 143* 144*  --   --   --  129*  CREATININE 2.61*  --   --  3.94* 3.52* 2.77*  --   --   --  2.27*  CALCIUM 8.2*  --   --  8.4* 8.6* 8.5*  --   --   --  8.3*  MG 2.9*  --   --  3.3* 3.2*  --  2.9*  --   --  3.0*  PHOS 3.4  --   --  4.8* 5.3*  --  4.1  --   --  4.6   < > = values in this interval not displayed.      CBC: Recent Labs  Lab 02/19/22 0412 02/20/22 0359 02/21/22 0354 02/22/22 0424 02/23/22 0317  WBC 12.3* 16.6* 16.2* 15.3* 16.0*  NEUTROABS 10.3* 14.0* 13.7* 12.3* 13.3*  HGB 10.1* 9.7* 9.7* 9.4* 9.4*  HCT 37.4* 34.6* 35.3* 34.0* 33.8*  MCV 96.6 95.8 95.9 94.4 95.8  PLT 181 192 192 194 180      No results found for: "HEPBSAG", "HEPBSAB", "HEPBIGM"    Microbiology:  Recent Results (from the past 240 hour(s))  Culture, Respiratory w Gram Stain     Status: None   Collection Time: 02/13/22  9:16 PM   Specimen: Tracheal Aspirate; Respiratory  Result Value Ref Range Status   Specimen Description   Final    TRACHEAL ASPIRATE Performed at [facility], [address] [place], [place] [address]    Special Requests   Final    NONE Performed at [facility], [address] [place], [place]  [address]    Gram Stain   Final    NO SQUAMOUS EPITHELIAL CELLS SEEN FEW WBC SEEN FEW GRAM NEGATIVE RODS MODERATE GRAM POSITIVE COCCI    Culture   Final    ABUNDANT STREPTOCOCCUS PNEUMONIAE ABUNDANT HAEMOPHILUS INFLUENZAE BETA LACTAMASE NEGATIVE NO STAPHYLOCOCCUS AUREUS ISOLATED No Pseudomonas species isolated Performed at [facility], [place] [address] [place], [place] [address]    Report Status 02/17/2022 FINAL  Final   Organism ID, Bacteria STREPTOCOCCUS PNEUMONIAE  Final      Susceptibility   Streptococcus pneumoniae - MIC*    ERYTHROMYCIN <=0.12 SENSITIVE Sensitive     LEVOFLOXACIN 0.5 SENSITIVE Sensitive     VANCOMYCIN 0.5 SENSITIVE Sensitive     PENICILLIN (meningitis) <=0.06 SENSITIVE Sensitive     PENO - penicillin <=0.06      PENICILLIN (non-meningitis) <=0.06 SENSITIVE Sensitive     PENICILLIN (oral) <=0.06 SENSITIVE Sensitive     CEFTRIAXONE (non-meningitis) <=0.12 SENSITIVE Sensitive     CEFTRIAXONE (meningitis) <=0.12 SENSITIVE Sensitive     * ABUNDANT STREPTOCOCCUS PNEUMONIAE  Culture, Respiratory w Gram Stain     Status: None   Collection Time: 02/16/22  4:27 PM   Specimen: Tracheal Aspirate; Respiratory  Result Value Ref Range Status   Specimen Description   Final    TRACHEAL ASPIRATE Performed at [facility], [address] [place], [place] [address]    Special Requests   Final    NONE Performed at [facility], [address] [place], [place] [address]    Gram Stain   Final    RARE WBC PRESENT,BOTH PMN AND MONONUCLEAR NO ORGANISMS SEEN    Culture   Final    NO GROWTH Performed at [facility], [place] [address], [place], [place] [address]    Report Status 02/20/2022 FINAL  Final  Culture, Respiratory w Gram Stain     Status: None   Collection Time: 02/21/22 11:41 AM   Specimen: Bronchoalveolar Lavage; Respiratory  Result Value Ref Range Status   Specimen Description   Final    BRONCHIAL ALVEOLAR LAVAGE Performed at  [facility], [address], [place], [place] [address]    Special Requests   Final    NONE Performed at [facility], [address] [place], [place] [address]  Gram Stain   Final    FEW WBC PRESENT,BOTH PMN AND MONONUCLEAR NO ORGANISMS SEEN    Culture   Final    NO GROWTH 2 DAYS Performed at [place] [address], [place], [place] [address]    Report Status 02/23/2022 FINAL  Final    Coagulation Studies: No results for input(s): "LABPROT", "INR" in the last 72 hours.  Urinalysis: No results for input(s): "COLORURINE", "LABSPEC", "PHURINE", "GLUCOSEU", "HGBUR", "BILIRUBINUR", "KETONESUR", "PROTEINUR", "UROBILINOGEN", "NITRITE", "LEUKOCYTESUR" in the last 72 hours.  Invalid input(s): "APPERANCEUR"    Imaging: DG Chest Port 1 View  Result Date: 02/23/2022 CLINICAL DATA:  [id number]; intubation EXAM: PORTABLE CHEST 1 VIEW COMPARISON:  Radiograph dated August 2,4, 2023 FINDINGS: Evaluation is limited by underpenetration, patient rotation and extensive grid line artifact. The cardiomediastinal silhouette is unchanged and enlarged in contour.ETT tip terminates 6.5 cm above the carina. Enteric tube courses past the carina; tip is not well visualized secondary to poor penetration. It is favored to be below the diaphragm. RIGHT upper extremity PICC tip is estimated to terminate over the superior cavoatrial junction. No large pleural effusion. No large pneumothorax. Diffuse bilateral airspace opacities, grossly similar comparison to prior given suboptimal technique. These are increased in the RIGHT lung in comparison to August 2nd with a persistent LEFT retrocardiac opacity in the LEFT lung and mildly improved aeration of the LEFT upper lung compared to August 2nd. IMPRESSION: Limited examination. Support apparatus as described above. Diffuse bilateral airspace opacities, favored to be similar comparison to most recent prior but overall worsened in the RIGHT lung  since August 2nd. Electronically Signed   By: Valentino Saxon M.D.   On: 02/23/2022 12:37   CT CHEST ABDOMEN PELVIS WO CONTRAST  Result Date: 02/21/2022 CLINICAL DATA:  Sepsis EXAM: CT CHEST, ABDOMEN AND PELVIS WITHOUT CONTRAST TECHNIQUE: Multidetector CT imaging of the chest, abdomen and pelvis was performed following the standard protocol without IV contrast. RADIATION DOSE REDUCTION: This exam was performed according to the departmental dose-optimization program which includes automated exposure control, adjustment of the mA and/or kV according to patient size and/or use of iterative reconstruction technique. COMPARISON:  Previous studies including chest radiograph done earlier today, CT chest done on 06/04/2020 FINDINGS: CT CHEST FINDINGS Cardiovascular: Heart is enlarged in size. There is ectasia of main pulmonary artery measuring 4.2 cm suggesting pulmonary arterial hypertension. Mediastinum/Nodes: There are slightly enlarged lymph nodes in mediastinum largest in the precarinal region measuring 1.3 cm in short axis. Tip of endotracheal tube is at the level of aortic arch. Enteric tube is noted traversing the esophagus. Tip of right PICC line is seen in superior vena cava. Lungs/Pleura: Patchy ground-glass and alveolar infiltrates are seen in both lungs. Dense infiltrates are seen in the posterior aspects of both lower lung fields with air bronchograms. Small pleural effusions are seen. There is no pneumothorax. Musculoskeletal: No acute abnormalities are seen in the bony structures in the thorax. CT ABDOMEN PELVIS FINDINGS Hepatobiliary: No focal abnormalities are seen in liver. There is no dilation of bile ducts. Surgical clips are seen in gallbladder fossa. Pancreas: No focal abnormalities are seen. Spleen: Spleen measures 13.9 cm in maximum diameter. Adrenals/Urinary Tract: Adrenals are unremarkable. There is no hydronephrosis. There are no renal or ureteral stones. Foley catheter is seen in the  bladder. There is no significant wall thickening in the bladder. Stomach/Bowel: Tip of NG tube is seen in the antrum of the stomach. There is contrast in the lumen of stomach and  few small bowel loops. Small bowel loops are not dilated. The appendix is not dilated. There is no significant wall thickening in colon. There is no pericolic stranding. Few diverticula seen in the colon without signs of focal diverticulitis. Vascular/Lymphatic: Scattered arterial calcifications are seen. Reproductive: Coarse calcifications are seen prostate. Other: There is no ascites or pneumoperitoneum. Musculoskeletal: Severe degenerative changes are noted at the L4-L5 level in lumbar spine with significant interval worsening in comparison with the study of 01/28/2016. IMPRESSION: Extensive ground-glass, alveolar and patchy dense infiltrates in both lungs suggesting multifocal pneumonia. Part of this finding may suggest underlying pulmonary edema. Small bilateral pleural effusions are seen. Cardiomegaly. There is ectasia of the main pulmonary artery suggesting pulmonary arterial hypertension. There is no evidence of intestinal obstruction or pneumoperitoneum. There is no hydronephrosis. Appendix is not dilated. UB diverticula are seen in the colon without signs of focal diverticulitis. Severe degenerative changes are noted at L4-L5 level with significant interval worsening. Findings may be due to severe disc degeneration. If there is clinical suspicion for discitis or osteomyelitis, follow-up MRI may be considered. Electronically Signed   By: Elmer Picker M.D.   On: 02/21/2022 16:17   CT HEAD WO CONTRAST (5MM)  Result Date: 02/21/2022 CLINICAL DATA:  Mental status change EXAM: CT HEAD WITHOUT CONTRAST TECHNIQUE: Contiguous axial images were obtained from the base of the skull through the vertex without intravenous contrast. RADIATION DOSE REDUCTION: This exam was performed according to the departmental dose-optimization  program which includes automated exposure control, adjustment of the mA and/or kV according to patient size and/or use of iterative reconstruction technique. COMPARISON:  Head CT dated December 26, 2020 FINDINGS: Brain: No evidence of acute infarction, hemorrhage, hydrocephalus, extra-axial collection or mass lesion/mass effect. Vascular: No hyperdense vessel or unexpected calcification. Skull: Normal. Negative for fracture or focal lesion. Sinuses/Orbits: Diffuse wall thickening of the paranasal sinuses with air-fluid level seen in the bilateral maxillary and left frontal sinus. Bilateral complete opacification of the mastoid air cells and inner ears. Other: None. IMPRESSION: 1. No acute intracranial abnormality. 2. Paranasal sinusitis. 3. Bilateral complete opacification of the mastoid air cells and inner ears, as can be seen in the setting of otitis media. Correlate with symptoms. Electronically Signed   By: Yetta Glassman M.D.   On: 02/21/2022 16:04   NM Pulmonary Perfusion  Result Date: 02/21/2022 CLINICAL DATA:  Shortness of breath, BILATERAL lower extremity edema EXAM: NUCLEAR MEDICINE PERFUSION LUNG SCAN TECHNIQUE: Perfusion images were obtained in multiple projections after intravenous injection of radiopharmaceutical. Ventilation scans intentionally deferred if perfusion scan and chest x-ray adequate for interpretation during COVID 19 epidemic. RADIOPHARMACEUTICALS:  4.35 mCi Tc-10mMAA IV COMPARISON:  Chest radiograph 02/21/2022 FINDINGS: Enlargement of cardiac silhouette. Small subsegmental perfusion defect lateral LEFT lower lobe corresponding to dense atelectasis versus consolidation on chest radiograph. No additional perfusion defects identified. IMPRESSION: Pulmonary embolism absent. Electronically Signed   By: MLavonia DanaM.D.   On: 02/21/2022 15:56     Medications:    sodium chloride 250 mL (02/19/22 1011)   sodium chloride Stopped (02/11/22 2011)   dextrose 125 mL/hr at 02/23/22 1054    feeding supplement (VITAL 1.5 CAL) 45 mL/hr at 02/23/22 1000   fentaNYL infusion INTRAVENOUS 200 mcg/hr (02/23/22 1000)   midazolam 6 mg/hr (02/23/22 1000)    artificial tears  1 Application Both Eyes QW1U  Chlorhexidine Gluconate Cloth  6 each Topical Q0600   dexamethasone (DECADRON) injection  8 mg Intravenous Q12H   feeding supplement (  PROSource TF)  90 mL Per Tube 5 X Daily   free water  250 mL Per Tube Q2H   heparin injection (subcutaneous)  5,000 Units Subcutaneous Q8H   insulin aspart  0-20 Units Subcutaneous Q4H   insulin aspart  8 Units Subcutaneous Q4H   insulin glargine-yfgn  20 Units Subcutaneous BID   insulin starter kit- pen needles  1 kit Other Once   ipratropium-albuterol  3 mL Nebulization Q6H   living well with diabetes book   Does not apply Once   multivitamin  1 tablet Per Tube QHS   mouth rinse  15 mL Mouth Rinse Q2H   oxyCODONE  10 mg Per Tube Q6H   pantoprazole sodium  40 mg Per Tube QHS   phenobarbital  97.2 mg Per Tube TID   polyethylene glycol  17 g Per Tube Daily   senna-docusate  1 tablet Per Tube BID   sodium chloride flush  10-40 mL Intracatheter Q12H   sodium chloride flush  3 mL Intravenous Q12H   sodium chloride, acetaminophen **OR** acetaminophen, docusate sodium, fentaNYL, midazolam, ondansetron **OR** ondansetron (ZOFRAN) IV, mouth rinse, polyethylene glycol, sodium chloride flush, sodium chloride flush, traZODone, vecuronium  Assessment/ Plan:  46 y.o. male with medical problems of poorly controlled diabetes, hypertension, systolic CHF, obstructive sleep apnea, obesity    admitted on 02/11/2022 for Acute respiratory failure (H[facility]) [J96.00] Respiratory failure (H[facility]) [J96.90] Peripheral edema [R60.9] Acute respiratory failure with hypoxia and hypercapnia ([facility]) [J96.01, J96.02]  #Acute kidney injury with hyperkalemia/hypernatremia.  Baseline creatinine 0.96 from February 11, 2022. Suspected secondary to fever, hypotension and sepsis. No IV contrast  exposure or nephrotoxic agents. CK 663,   Discontinue veltassa  Suggest D5W IV  125cc/hr for hypernatremia and discontinuing DDAVP  UOP 3.3L  Cr 2.27, trending down from 2.77   #Acute respiratory failure Ventilator assisted.  Management as per ICU team.   bronchoscopy was performed Friday    #Acute exacerbation of systolic and diastolic CHF 2D echo from February 11, 2022-LVEF 55 to 60%, moderate LVH, grade 1 diastolic dysfunction, enlarged right ventricle, moderately dilated right atrium, moderately dilated left atrium,  #Poorly controlled diabetes type 2  Lab Results  Component Value Date   HGBA1C 10.1 (H) 02/11/2022  Glucose remains elevated due to steroid use.     LOS: 12 [place] P Tedrow 8/6/20231:39 PM  [facility] [place], [place]  I saw and evaluated the patient with Zonia Kief, PA.  I personally formulated the plan of care.  I agree with the findings and plan as documented in the note except as noted below.  Patient continues to have good urine output of 3.3 L.  Creatinine currently 2.2.  Sodium high at 159.  Discussed with critical care team.  Start the patient on D5W at 125 cc/h.  Ethelwyn Gilbertson Holley Raring , [facility] 8/6/202310:59 PM

## 2022-02-23 NOTE — Consult Note (Signed)
PHARMACY CONSULT NOTE - FOLLOW UP  Pharmacy Consult for Electrolyte Monitoring and Replacement   Recent Labs: Potassium (mmol/L)  Date Value  02/23/2022 4.9   Magnesium (mg/dL)  Date Value  02/23/2022 3.0 (H)   Calcium (mg/dL)  Date Value  02/23/2022 8.3 (L)   Albumin (g/dL)  Date Value  02/11/2022 3.5   Phosphorus (mg/dL)  Date Value  02/23/2022 4.6   Sodium (mmol/L)  Date Value  02/23/2022 159 (H)     Assessment: 46 y.o male with significant PMH as below who presented to the ED with chief complaints of SOB and worsening bilateral lower extremities edema. Elevated sodium and potassium levels. Hx of CHF, DM, HTN, and pancreatitis.   On free water 250 ml q2H.   Goal of Therapy:  WNL  Plan:  No replacement needed. Pt received DDAVP on 8/6.   Samuel Chavez ,PharmD Clinical Pharmacist 02/23/2022 9:25 AM

## 2022-02-23 NOTE — Progress Notes (Signed)
  NAME:  Samuel Chavez, MRN:  [id number], DOB:  11/23/1975, LOS: 12 ADMISSION DATE:  02/11/2022, CONSULTATION DATE: 02/11/22 REFERRING MD:  Mansy, Jan MD  CHIEF COMPLAINT: SOB   CC  follow up RESP FAILURE/acute CHF exacerbation  HPI/SYNOPSIS  46 y.o male with significant PMH as below who presented to the ED with chief complaints of SOB and worsening bilateral lower extremities edema.  ED Course: In the emergency department, the temperature was 37.3C, the heart rate 99 beats/minute, the blood pressure 187/102 mm Hg, the respiratory rate 20 breaths/minute, and the oxygen saturation 89% on. He was noted to be drowsy but still protecting his airway and responding appropriately.  Pertinent INITIAL Labs/Diagnostics Findings: Chemistry:Glucose:319 Calcium: 8.8 otherwise unremarkable CBC: unremarkable Other Lab findings: ABG with hypoxemia and hypercapnia, .  Imaging: Chest x-ray showed bilateral infiltrates worse on right  -02/17/22- patient is severely critically ill with bilateral multifocal pneumonia on sedation and paralysis with mechanical ventilation maximal settings.  He is at cusp of death, we met with daughter today and discussed severity of illness. Unable to perform CT due to severity of critical illness. Met with previous PCCM doc and discussed medical plan.  Patient had recruitment maneuvers last few days without improvement, on heparin gtt for possible PE.   02/18/22- patient continues to require maximal settings on ventilator despite good UOP.  He destaturated to <50% spO2 on MV today required BGV.   02/19/22- patient continues to require 100% FiO2 on maximal setting with high PEEP ladder for possible ARDS.  He was diuresed and on steroids with development of AKI.  His CXR had slight interval improvement on right. We discussed case with vascular surgery regarding possible empiric tPA for PE.  02/20/22- patient was unable to get CT today due to continued severe hypoxemia.  Daughter and  other family members at bedside we reviewed case and medical plan.  There seems to be some confusion from family as they had asked me to wake patient up and take off ventilator even though I had explained multiple times that he is at very high risk for death.  They laughed at my comments and seemed to think it was not true. We may still be able to do bronchoscopy but its high risk.  We have reduced IV infusions by switching some medications to OGT route.   02/21/22- patient is weaned to 60%FiO2.  Plan for possible bronch if patient is tolerating.  Family at bedside.  If able to we will obtain more imaging and VQ scan to rule out PE.  02/22/22- Patient weaned to 50% on PRVC.  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 CBC stable , CMP with improved GFR. Monitoring Na, pharmacy and renal following for electrolytes/renal function.  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  Overall marked improvement on ventilator over past 48h. Met with daughter at bedside reviewed medical plan.   Significant Hospital Events   7/25: Admitted to hospitalist service w/acute on chronic hypoxic hypercapnic resp. failure requiring BiPAP.Failed BiPAP and intubated. PCCM consulted 7/26: INTUBATED, difficlut to sedate, started KETAMINE INFUSION 7/27: severe hypoxia, PEEP increased to 15 but decreased back to 10 7/28: Persistent severe hypoxia, ventilator changes made 7/29: Persistent hypoxia, recruitment maneuvers instituted, ventilator changes made, empiric heparin as patient cannot be scanned query PE  Consults:  PCCM  Procedures:  7/25: Intubation 7/26: PICC triple-lumen, right basilic vein  Significant Diagnostic Tests:    7/25  Chest Xray:Chest x-ray showed cardiomegaly and pulmonary vascular congestion without overt pulmonary edema 7/25 Echocardiogram: difficult study, LVEF was estimated at 55 to 60%, dilated LV moderate LVH,  grade 1 DD, enlargement of the right ventricle, left atrial size moderately dilated, right atrial size moderately dilated this is consistent with restrictive physiology (echo reading not congruous with prior echoes from [facility]) 7/30 Limited echo: LVEF 40 to 45% decreased LV function, no wall motion abnormalities, moderate dilation of the LV concentric LVH, right ventricular size moderately enlarged, right atrial enlargement, this is more consistent with prior [facility] scans  Micro Data:  7/25: SARS-CoV-2 PCR> negative 7/25: MRSA PCR>> NEG 7/27: Strep pneumoniae Ag>> negative 7/27: Legionella Ag>> 7/27: Mycoplasma>> 7/27: Sputum>> Strep pneumo,Haemophilus influenzae 7/30 Sputum cult >>  Antimicrobials:  7/27: Maxipime >> 7/27: Vancomycin >> 7/29 7/30: Vancomycin (resumed due to fever spike on Maxipime) ? Resistant Strep pneumo   REVIEW OF SYSTEMS Patient is unable to provide complete review of systems due to severe critical illness/ventilator dependence  OBJECTIVE  Blood pressure (!) 110/59, pulse 85, temperature 98 F (36.7 C), temperature source Esophageal, resp. rate (!) 30, height 5' 9.02" (1.753 m), weight (!) 205.4 kg, SpO2 95 %. CVP:  [21 mmHg-26 mmHg] 23 mmHg   Vent Mode: PRVC FiO2 (%):  [40 %-55 %] 40 % Set Rate:  [30 bmp] 30 bmp Vt Set:  [500 mL] 500 mL PEEP:  [10 cmH20-14 cmH20] 10 cmH20 Plateau Pressure:  [29 cmH20] 29 cmH20   Intake/Output Summary (Last 24 hours) at 02/23/2022 1040 Last data filed at 02/23/2022 1000 Gross per 24 hour  Intake 8138.73 ml  Output 3650 ml  Net 4488.73 ml    Filed Weights   02/21/22 0320 02/22/22 0200 02/23/22 0500  Weight: (!) 210 kg (!) 205 kg (!) 205.4 kg   PHYSICAL EXAMINATION: GENERAL: Morbidly obese man, intubated, mechanically ventilated, sedated.  Malodorous mouth secretions. HEAD: Normocephalic, atraumatic.  EYES: Pupils equal, round, reactive to light.  No scleral icterus.  MOUTH: Macroglossia  (massive).  Orotracheally intubated, OG in place. NECK: Supple. No thyromegaly. Trachea midline. No JVD.  No adenopathy. PULMONARY: Good air entry bilaterally.  Occasional rhonchi noted. CARDIOVASCULAR: S1 and S2.  Sinus tach.  ABDOMEN: Obese, soft, nondistended, tolerating tube feeds. MUSCULOSKELETAL: No joint deformity, no clubbing, no edema.  SCDs in place. NEUROLOGIC: Sedated and paralyzed on ventilator GCS 4T SKIN: Intact,warm,dry.  Chronic stasis changes in the lower extremities. PSYCH: Not assessed due to mechanically ventilated status.  Labs/imaging that I havepersonally reviewed    Labs   CBC: Recent Labs  Lab 02/19/22 0412 02/20/22 0359 02/21/22 0354 02/22/22 0424 02/23/22 0317  WBC 12.3* 16.6* 16.2* 15.3* 16.0*  NEUTROABS 10.3* 14.0* 13.7* 12.3* 13.3*  HGB 10.1* 9.7* 9.7* 9.4* 9.4*  HCT 37.4* 34.6* 35.3* 34.0* 33.8*  MCV 96.6 95.8 95.9 94.4 95.8  PLT 181 192 192 194 180     Basic Metabolic Panel: Recent Labs  Lab 02/19/22 0412 02/19/22 0920 02/19/22 1337 02/20/22 0359 02/21/22 0354 02/22/22 0424 02/22/22 0919 02/22/22 1450 02/22/22 2038 02/23/22 0317  NA 151*  --   --  151* 156* 160* 159* 160* 160* 159*  K 5.7*   < > 5.8* 5.4* 5.6* 5.2*  --   --   --  4.9  CL 113*  --   --  117* 122* 124*  --   --   --  125*  CO2 30  --   --  27 27 27  --   --   --    28  GLUCOSE 246*  --   --  322* 273* 192*  --   --   --  175*  BUN 94*  --   --  125* 143* 144*  --   --   --  129*  CREATININE 2.61*  --   --  3.94* 3.52* 2.77*  --   --   --  2.27*  CALCIUM 8.2*  --   --  8.4* 8.6* 8.5*  --   --   --  8.3*  MG 2.9*  --   --  3.3* 3.2*  --  2.9*  --   --  3.0*  PHOS 3.4  --   --  4.8* 5.3*  --  4.1  --   --  4.6   < > = values in this interval not displayed.    GFR: Estimated Creatinine Clearance: 73.2 mL/min (A) (by C-G formula based on SCr of 2.27 mg/dL (H)). Recent Labs  Lab 02/19/22 0920 02/20/22 0359 02/21/22 0354 02/22/22 0424 02/23/22 0317  PROCALCITON  8.62 7.30  --   --   --   WBC  --  16.6* 16.2* 15.3* 16.0*     Liver Function Tests: No results for input(s): "AST", "ALT", "ALKPHOS", "BILITOT", "PROT", "ALBUMIN" in the last 168 hours.  ABG    Component Value Date/Time   PHART 7.3 (L) 02/23/2022 0811   PCO2ART 62 (H) 02/23/2022 0811   PO2ART 39 (LL) 02/23/2022 0811   HCO3 30.5 (H) 02/23/2022 0811   TCO2 26 03/17/2007 1822   O2SAT 62.4 02/23/2022 0811     Home Medications  Prior to Admission medications   Medication Sig Start Date End Date Taking? Authorizing Provider  losartan (COZAAR) 100 MG tablet Take 100 mg by mouth daily. 02/01/22  Yes [provider]  OZEMPIC, 0.25 OR 0.5 MG/DOSE, 2 MG/3ML SOPN SMARTSIG:0.25 Milligram(s) SUB-Q Once a Week 12/09/21  Yes [provider]  torsemide (DEMADEX) 20 MG tablet Take 20 mg by mouth daily. 12/18/21  Yes [provider]  Aspirin-Salicylamide-Caffeine (BC HEADACHE POWDER PO) Take 1 packet by mouth as needed (for pain).    [provider]  cephALEXin (KEFLEX) 500 MG capsule Take 1 capsule (500 mg total) by mouth 4 (four) times daily. Patient not taking: Reported on 02/11/2022 12/16/18   Pickering, Nathan, MD  cyclobenzaprine (FLEXERIL) 10 MG tablet Take 0.5-1 tablets (5-10 mg total) by mouth 2 (two) times daily as needed for muscle spasms. Patient not taking: Reported on 02/11/2022 03/12/18   Harris, Abigail, PA-C  gabapentin (NEURONTIN) 600 MG tablet Take 600 mg by mouth at bedtime. Patient not taking: Reported on 02/11/2022 10/13/21   [provider]  LABETALOL HCL PO Take by mouth. Patient not taking: Reported on 02/11/2022    [provider]  meloxicam (MOBIC) 15 MG tablet Take 1 tablet (15 mg total) by mouth daily. Take 1 daily with food. Patient not taking: Reported on 02/11/2022 03/12/18   Harris, Abigail, PA-C  metFORMIN (GLUCOPHAGE) 500 MG tablet Take 500 mg by mouth 2 (two) times daily with a meal. Patient not taking: Reported on  02/11/2022    [provider]  METFORMIN HCL ER, MOD, PO Take by mouth. Patient not taking: Reported on 02/11/2022    [provider]  METOPROLOL SUCCINATE ER PO Take by mouth. Patient not taking: Reported on 02/11/2022    [provider]  oxyCODONE-acetaminophen (PERCOCET) 10-325 MG tablet Take 1 tablet by mouth See admin instructions. Take 1   tablet by mouth 4-5 times a day as needed for pain Patient not taking: Reported on 02/11/2022    [provider]  oxyCODONE-acetaminophen (PERCOCET/ROXICET) 5-325 MG tablet Take 1 tablet by mouth every 8 (eight) hours as needed for severe pain. Patient not taking: Reported on 02/11/2022 12/16/18   Pickering, Nathan, MD  predniSONE (DELTASONE) 20 MG tablet Take 2 tablets (40 mg total) by mouth daily. Patient not taking: Reported on 02/11/2022 04/04/15   Pickering, Nathan, MD   Hospital Scheduled Meds:  artificial tears  1 Application Both Eyes Q8H   Chlorhexidine Gluconate Cloth  6 each Topical Q0600   dexamethasone (DECADRON) injection  8 mg Intravenous Q12H   feeding supplement (PROSource TF)  90 mL Per Tube 5 X Daily   free water  250 mL Per Tube Q2H   heparin injection (subcutaneous)  5,000 Units Subcutaneous Q8H   insulin aspart  0-20 Units Subcutaneous Q4H   insulin aspart  8 Units Subcutaneous Q4H   insulin glargine-yfgn  20 Units Subcutaneous BID   insulin starter kit- pen needles  1 kit Other Once   ipratropium-albuterol  3 mL Nebulization Q6H   living well with diabetes book   Does not apply Once   multivitamin  1 tablet Per Tube QHS   mouth rinse  15 mL Mouth Rinse Q2H   oxyCODONE  10 mg Per Tube Q6H   pantoprazole sodium  40 mg Per Tube QHS   phenobarbital  97.2 mg Per Tube TID   polyethylene glycol  17 g Per Tube Daily   senna-docusate  1 tablet Per Tube BID   sodium chloride flush  10-40 mL Intracatheter Q12H   sodium chloride flush  3 mL Intravenous Q12H   Continuous Infusions:  sodium chloride 250  mL (02/19/22 1011)   sodium chloride Stopped (02/11/22 2011)   dextrose     feeding supplement (VITAL 1.5 CAL) 45 mL/hr at 02/23/22 1000   fentaNYL infusion INTRAVENOUS 200 mcg/hr (02/23/22 1000)   midazolam 6 mg/hr (02/23/22 1000)   PRN Meds:.sodium chloride, acetaminophen **OR** acetaminophen, docusate sodium, fentaNYL, midazolam, ondansetron **OR** ondansetron (ZOFRAN) IV, mouth rinse, polyethylene glycol, sodium chloride flush, sodium chloride flush, traZODone, vecuronium      Active Hospital Problem list    Patient Active Problem List   Diagnosis Date Noted   Acute respiratory failure (HCC) 02/11/2022   Respiratory failure (HCC) 02/11/2022   Acute respiratory failure with hypoxia and hypercarbia (HCC) 02/11/2022   Acute on chronic systolic CHF (congestive heart failure) (HCC) 02/11/2022   Hypertensive urgency 02/11/2022   Type 2 diabetes mellitus with peripheral neuropathy (HCC) 02/11/2022   OBSTRUCTIVE SLEEP APNEA 04/30/2009    Assessment & Plan:   46 yo morbidly obese AAM with severe decompensated acute systolic and diastolic heart failure with previously documented severe OSA/OHS, noncompliant with CPAP, active smoker, leading to severe hypoxic resp failure with encephalopathy and difficulty with sedation  Severe acute on chronic hypoxic and hypercapnic respiratory failure due to CAP and acute decompensation of chronic heart failure Persistent hypoxemia despite trial of several ventilator strategies continue Mechanical Ventilator support Wean Fio2 and PEEP as tolerated Continue VAP/VENT bundles  Wean PEEP & FiO2 as tolerated, maintain SpO2 > 88% Head of bed elevated 30 degrees, VAP protocol in place Plateau pressures less than 30 cm H20 as able Intermittent chest x-ray & ABG PRN Ensure adequate pulmonary hygiene  will NOT perform SAT/SBT due to high FiO2 requirements, asynchrony off sedation  Requiring paralytic infusion for ventilator   synchrony Recruitment maneuvers  every 4h and as needed. On heparin infusion as cannot exclude PE, however no DVT on LE Dopplers assuring, Patient cannot be proned, due to weight limit for safe proning and unstable airway (ET tube with constant need of repositioning)   Community-acquired pneumonia: Streptococcus pneumoniae and Haemophilus influenzae Fever spike today Sputum re-cultured Changed antibiotics to ceftaz  - s/p bronchoscopy with removal of ispissated mucus plugging bilaterally with interval improvement post procedure.   Acute on chronic systolic and diastolic heart failure LVEF 40 to 45% consistent with prior studies performed at [facility] Continue ventilator support 2D Echocardiogram shows diastolic dysfunction and enlarged left and right atria consistent with restrictive physiology  ACUTE KIDNEY INJURY/Renal Failure -STAGE 4 -continues to improve Element of cardiorenal syndrome -continue Foley catheter-assess need -Avoid nephrotoxic agents -Follow urine output, BMP -Ensure adequate renal perfusion, optimize oxygenation -Renal dose medications -Nephrology- on case - renal function improving -NON OLIGURIC    SEVERE HYPERNATREMIA    - 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        Intake/Output Summary (Last 24 hours) at 02/23/2022 1040 Last data filed at 02/23/2022 1000 Gross per 24 hour  Intake 8138.73 ml  Output 3650 ml  Net 4488.73 ml        Latest Ref Rng & Units 02/23/2022    3:17 AM 02/22/2022    8:38 PM 02/22/2022    2:50 PM  BMP  Glucose 70 - 99 mg/dL 175     BUN 6 - 20 mg/dL 129     Creatinine 0.61 - 1.24 mg/dL 2.27     Sodium 135 - 145 mmol/L 159  160  160   Potassium 3.5 - 5.1 mmol/L 4.9     Chloride 98 - 111 mmol/L 125     CO2 22 - 32 mmol/L 28     Calcium 8.9 - 10.3 mg/dL 8.3       ENDO - ICU hypoglycemic\Hyperglycemia protocol -check FSBS per protocol -Diabetes mellitus -HgbA1c   10.1 -CBG's AC & hs; Target range of 140 to 180 -SSI -Follow ICU Hypo/Hyperglycemia protocol -Hold Metformin  GI GI PROPHYLAXIS PPI  NUTRITIONAL STATUS DIET-->TF's as tolerated Constipation protocol as indicated  ACUTE ANEMIA- Transfuse as needed Consider transfusion  if hgb<7  Diet: Tube feeds Pain/Anxiety/Delirium protocol (if indicated): Yes (RASS goal 0) VAP protocol (if indicated): Yes DVT prophylaxis: Systemic AC GI prophylaxis: PPI Glucose control:  SSI Yes Central venous access:  N/A Arterial line:  N/A Foley:  Yes, and it is still needed Mobility:  bed rest  PT consulted: N/A Last date of multidisciplinary goals of care discussion [7/25] Code Status:  full code Disposition: ICU     IMAGING    -CT CHEST WITH MULTIFOCAL BILATERAL PNEUMONIA    Critical care provider statement:   Total critical care time: 33 minutes   Performed by: Aleskerov MD   Critical care time was exclusive of separately billable procedures and treating other patients.   Critical care was necessary to treat or prevent imminent or life-threatening deterioration.   Critical care was time spent personally by me on the following activities: development of treatment plan with patient and/or surrogate as well as nursing, discussions with consultants, evaluation of patient's response to treatment, examination of patient, obtaining history from patient or surrogate, ordering and performing treatments and interventions, ordering and review of laboratory studies, ordering and review of radiographic studies, pulse   oximetry and re-evaluation of patient's condition.    Fuad Aleskerov, M.D.  Pulmonary & Critical Care Medicine          

## 2022-02-23 NOTE — Consult Note (Signed)
PHARMACY CONSULT NOTE  Pharmacy Consult for Electrolyte Monitoring and Replacement   Recent Labs: Potassium (mmol/L)  Date Value  02/23/2022 4.9   Magnesium (mg/dL)  Date Value  02/23/2022 3.0 (H)   Calcium (mg/dL)  Date Value  02/23/2022 8.3 (L)   Albumin (g/dL)  Date Value  02/11/2022 3.5   Phosphorus (mg/dL)  Date Value  02/23/2022 4.6   Sodium (mmol/L)  Date Value  02/23/2022 159 (H)   Assessment: Patient is a 46 y/o M with medical history including DM, HTN, pancreatitis, tobacco use disorder, systolic CHF, OSA, DM c/b diabetic neuropathy, lumbar radiculopathy, morbid obesity who is admitted with acute respiratory failure in setting of acute CHF and hypertensive urgency. Patient is currently intubated, sedated, and on mechanical ventilation in the ICU. Pharmacy consulted to assist with electrolyte monitoring and replacement as indicated.  Nutrition: Tube feeds at 45 mL/hr (~ 1 L/day) + free water increased to 250 mL q2h on 8/4 (3 L/day)   Diuretics: IV Lasix 40 mg BID (discontinued 8/1 for AKI)  Misc: DDAVP 4 mcg given 8/5 and 8/6  Nephrology consulted for AKI, patient appears to be experiencing renal recovery  Goal of Therapy:  Electrolytes within normal limits  Plan:  --Persistent hypernatremia despite increase in free water flushes and DDAVP. Patient was net positive 4L yesterday --K 4.9, Scr continues to improve. Continue with Veltassa 16.8 g daily for now, may be able to discontinue this tomorrow --Re-check electrolytes with AM labs tomorrow  Benita Gutter  02/23/2022 7:33 AM

## 2022-02-23 NOTE — Consult Note (Addendum)
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   Monitoring Parameters Appropriate dose range: 0.5-22mcg  Criteria for IV DDAVP:  polyuria (>257ml/hr x 2 hrs) serum Na > 145  urine osmolality <300 mosm/kg or urine specific gravity < 1.005  Criteria for additional dose: UOP does not respond, urine osmolality <300 mosm/kg at 6 hrs after DDAVP, and serum Na would not decrease by 10-31mEq/L in 24 hr period with additional dose of DDAVP.  If first dose of DDAVP did not illicit appropriate response and was not at top of appropriate range consider increasing repeat dose (dose range: 0.5-58mcg)   Ruba Outen D 02/23/22

## 2022-02-23 NOTE — Progress Notes (Signed)
Tolerated recruitment well. Found settings per vent vt 500 r30 peep14 40%. Abg obtained. Increased fio2 to 55% due to desaturation and low po2. Will continue to monitor

## 2022-02-24 DIAGNOSIS — J9601 Acute respiratory failure with hypoxia: Secondary | ICD-10-CM | POA: Diagnosis not present

## 2022-02-24 DIAGNOSIS — Z7189 Other specified counseling: Secondary | ICD-10-CM | POA: Diagnosis not present

## 2022-02-24 DIAGNOSIS — J9602 Acute respiratory failure with hypercapnia: Secondary | ICD-10-CM | POA: Diagnosis not present

## 2022-02-24 DIAGNOSIS — Z515 Encounter for palliative care: Secondary | ICD-10-CM | POA: Diagnosis not present

## 2022-02-24 LAB — CBC WITH DIFFERENTIAL/PLATELET
Abs Immature Granulocytes: 0.16 10*3/uL — ABNORMAL HIGH (ref 0.00–0.07)
Basophils Absolute: 0 10*3/uL (ref 0.0–0.1)
Basophils Relative: 0 %
Eosinophils Absolute: 0.2 10*3/uL (ref 0.0–0.5)
Eosinophils Relative: 1 %
HCT: 34.1 % — ABNORMAL LOW (ref 39.0–52.0)
Hemoglobin: 9.4 g/dL — ABNORMAL LOW (ref 13.0–17.0)
Immature Granulocytes: 1 %
Lymphocytes Relative: 6 %
Lymphs Abs: 1 10*3/uL (ref 0.7–4.0)
MCH: 26 pg (ref 26.0–34.0)
MCHC: 27.6 g/dL — ABNORMAL LOW (ref 30.0–36.0)
MCV: 94.5 fL (ref 80.0–100.0)
Monocytes Absolute: 0.6 10*3/uL (ref 0.1–1.0)
Monocytes Relative: 4 %
Neutro Abs: 13.7 10*3/uL — ABNORMAL HIGH (ref 1.7–7.7)
Neutrophils Relative %: 88 %
Platelets: 193 10*3/uL (ref 150–400)
RBC: 3.61 MIL/uL — ABNORMAL LOW (ref 4.22–5.81)
RDW: 15.6 % — ABNORMAL HIGH (ref 11.5–15.5)
WBC: 15.6 10*3/uL — ABNORMAL HIGH (ref 4.0–10.5)
nRBC: 0.3 % — ABNORMAL HIGH (ref 0.0–0.2)

## 2022-02-24 LAB — BASIC METABOLIC PANEL
Anion gap: 7 (ref 5–15)
Anion gap: 7 (ref 5–15)
Anion gap: 7 (ref 5–15)
BUN: 117 mg/dL — ABNORMAL HIGH (ref 6–20)
BUN: 107 mg/dL — ABNORMAL HIGH (ref 6–20)
BUN: 74 mg/dL — ABNORMAL HIGH (ref 6–20)
CO2: 26 mmol/L (ref 22–32)
CO2: 28 mmol/L (ref 22–32)
CO2: 27 mmol/L (ref 22–32)
Calcium: 8.2 mg/dL — ABNORMAL LOW (ref 8.9–10.3)
Calcium: 8.2 mg/dL — ABNORMAL LOW (ref 8.9–10.3)
Calcium: 8.3 mg/dL — ABNORMAL LOW (ref 8.9–10.3)
Chloride: 122 mmol/L — ABNORMAL HIGH (ref 98–111)
Chloride: 118 mmol/L — ABNORMAL HIGH (ref 98–111)
Chloride: 119 mmol/L — ABNORMAL HIGH (ref 98–111)
Creatinine, Ser: 1.87 mg/dL — ABNORMAL HIGH (ref 0.61–1.24)
Creatinine, Ser: 1.85 mg/dL — ABNORMAL HIGH (ref 0.61–1.24)
Creatinine, Ser: 1.76 mg/dL — ABNORMAL HIGH (ref 0.61–1.24)
GFR, Estimated: 45 mL/min — ABNORMAL LOW (ref >60)
GFR, Estimated: 45 mL/min — ABNORMAL LOW (ref >60)
GFR, Estimated: 48 mL/min — ABNORMAL LOW (ref >60)
Glucose, Bld: 300 mg/dL — ABNORMAL HIGH (ref 70–99)
Glucose, Bld: 338 mg/dL — ABNORMAL HIGH (ref 70–99)
Glucose, Bld: 153 mg/dL — ABNORMAL HIGH (ref 70–99)
Potassium: 5.8 mmol/L — ABNORMAL HIGH (ref 3.5–5.1)
Potassium: 5.5 mmol/L — ABNORMAL HIGH (ref 3.5–5.1)
Potassium: 4.8 mmol/L (ref 3.5–5.1)
Sodium: 155 mmol/L — ABNORMAL HIGH (ref 135–145)
Sodium: 153 mmol/L — ABNORMAL HIGH (ref 135–145)
Sodium: 153 mmol/L — ABNORMAL HIGH (ref 135–145)

## 2022-02-24 LAB — GLUCOSE, CAPILLARY
Glucose-Capillary: 147 mg/dL — ABNORMAL HIGH (ref 70–99)
Glucose-Capillary: 149 mg/dL — ABNORMAL HIGH (ref 70–99)
Glucose-Capillary: 167 mg/dL — ABNORMAL HIGH (ref 70–99)
Glucose-Capillary: 199 mg/dL — ABNORMAL HIGH (ref 70–99)
Glucose-Capillary: 237 mg/dL — ABNORMAL HIGH (ref 70–99)
Glucose-Capillary: 243 mg/dL — ABNORMAL HIGH (ref 70–99)
Glucose-Capillary: 279 mg/dL — ABNORMAL HIGH (ref 70–99)
Glucose-Capillary: 284 mg/dL — ABNORMAL HIGH (ref 70–99)
Glucose-Capillary: 297 mg/dL — ABNORMAL HIGH (ref 70–99)
Glucose-Capillary: 311 mg/dL — ABNORMAL HIGH (ref 70–99)

## 2022-02-24 LAB — ANCA TITERS
Atypical P-ANCA titer: <1:20 {titer}
C-ANCA: <1:20 {titer}
P-ANCA: <1:20 {titer}

## 2022-02-24 LAB — CORTISOL: Cortisol, Plasma: 4.5 ug/dL

## 2022-02-24 LAB — C-REACTIVE PROTEIN: CRP: 17.4 mg/dL — ABNORMAL HIGH (ref <1.0)

## 2022-02-24 LAB — MAGNESIUM: Magnesium: 3.4 mg/dL — ABNORMAL HIGH (ref 1.7–2.4)

## 2022-02-24 LAB — PHOSPHORUS: Phosphorus: 4.8 mg/dL — ABNORMAL HIGH (ref 2.5–4.6)

## 2022-02-24 MED ORDER — INSULIN REGULAR(HUMAN) IN NACL 100-0.9 UT/100ML-% IV SOLN
INTRAVENOUS | Status: DC
  Administered 2022-02-24: 15 [IU]/h via INTRAVENOUS
  Administered 2022-02-25: 5.5 [IU]/h via INTRAVENOUS
  Administered 2022-02-26: 10 [IU]/h via INTRAVENOUS
  Administered 2022-02-26 (×2): 12 [IU]/h via INTRAVENOUS
  Administered 2022-02-27: 4.6 [IU]/h via INTRAVENOUS
  Administered 2022-02-27: 13 [IU]/h via INTRAVENOUS
  Administered 2022-02-28: 10 [IU]/h via INTRAVENOUS
  Administered 2022-02-28: 16 [IU]/h via INTRAVENOUS
  Administered 2022-03-01: 7.5 [IU]/h via INTRAVENOUS
  Administered 2022-03-01: 13 [IU]/h via INTRAVENOUS
  Administered 2022-03-02 – 2022-03-03 (×3): 7.5 [IU]/h via INTRAVENOUS
  Filled 2022-02-24 (×14): qty 100

## 2022-02-24 MED ORDER — FUROSEMIDE 10 MG/ML IJ SOLN
40.0000 mg | Freq: Once | INTRAMUSCULAR | Status: AC
  Administered 2022-02-24: 40 mg via INTRAVENOUS
  Filled 2022-02-24: qty 4

## 2022-02-24 MED ORDER — MIDAZOLAM-SODIUM CHLORIDE 100-0.9 MG/100ML-% IV SOLN
2.0000 mg/h | INTRAVENOUS | Status: DC
  Administered 2022-02-24 – 2022-02-26 (×3): 6 mg/h via INTRAVENOUS
  Administered 2022-02-27: 2 mg/h via INTRAVENOUS
  Filled 2022-02-24 (×4): qty 100

## 2022-02-24 MED ORDER — INSULIN GLARGINE-YFGN 100 UNIT/ML ~~LOC~~ SOLN
28.0000 [IU] | Freq: Two times a day (BID) | SUBCUTANEOUS | Status: DC
Start: 2022-02-24 — End: 2022-02-24
  Filled 2022-02-24: qty 0.28

## 2022-02-24 MED ORDER — DEXTROSE 50 % IV SOLN: 0.0000 mL | INTRAVENOUS | Status: DC | PRN

## 2022-02-24 MED ORDER — PATIROMER SORBITEX CALCIUM 8.4 G PO PACK
16.8000 g | PACK | Freq: Every day | ORAL | Status: DC
Start: 2022-02-24 — End: 2022-02-24

## 2022-02-24 MED ORDER — PATIROMER SORBITEX CALCIUM 8.4 G PO PACK
16.8000 g | PACK | Freq: Every day | ORAL | Status: DC
  Administered 2022-02-24 – 2022-02-27 (×4): 16.8 g via ORAL
  Filled 2022-02-24 (×5): qty 2

## 2022-02-24 MED ORDER — SODIUM ZIRCONIUM CYCLOSILICATE 5 G PO PACK
10.0000 g | PACK | Freq: Once | ORAL | Status: AC
  Administered 2022-02-24: 10 g
  Filled 2022-02-24: qty 2

## 2022-02-24 NOTE — TOC Progression Note (Signed)
Transition of Care [facility]) - Progression Note    Patient Details  Name: Samuel Chavez MRN: [id number] Date of Birth: [birth year]  [facility]) CM/SW Contact  Shelbie Hutching, RN Phone Number: 02/24/2022, 12:05 PM  Clinical Narrative:    TOC continues to follow, patient will get a trach tomorrow.  Referral for LTAC given to [facility] with [facility], their [facility] facility may be able to accept the patient's Medicaid, she will have them evaluate.     Expected Discharge Plan:  (TBD) Barriers to Discharge: Continued Medical Work up  Expected Discharge Plan and Services Expected Discharge Plan:  (TBD)   Discharge Planning Services: CM Consult   Living arrangements for the past 2 months: Single Family Home                                       Social Determinants of Health (SDOH) Interventions    Readmission Risk Interventions     No data to display

## 2022-02-24 NOTE — Consult Note (Signed)
PHARMACY CONSULT NOTE  Pharmacy Consult for Electrolyte Monitoring and Replacement   Recent Labs: Potassium (mmol/L)  Date Value  02/24/2022 5.8 (H)   Magnesium (mg/dL)  Date Value  02/24/2022 3.4 (H)   Calcium (mg/dL)  Date Value  02/24/2022 8.2 (L)   Albumin (g/dL)  Date Value  02/11/2022 3.5   Phosphorus (mg/dL)  Date Value  02/24/2022 4.8 (H)   Sodium (mmol/L)  Date Value  02/24/2022 155 (H)   Assessment: Patient is a 46 y/o M with medical history including DM, HTN, pancreatitis, tobacco use disorder, systolic CHF, OSA, DM c/b diabetic neuropathy, lumbar radiculopathy, morbid obesity who is admitted with acute respiratory failure in setting of acute CHF and hypertensive urgency. Patient is currently intubated, sedated, and on mechanical ventilation in the ICU. Pharmacy consulted to assist with electrolyte monitoring and replacement as indicated.  Nutrition: Tube feeds at 45 mL/hr (~ 1 L/day) + free water increased to 250 mL q2h on 8/4 (3 L/day)   Diuretics: IV Lasix 40 mg BID (discontinued 8/1 for AKI)  Misc: DDAVP 4 mcg given 8/5 and 8/6  Nephrology consulted for AKI, patient appears to be experiencing renal recovery  Goal of Therapy:  Electrolytes within normal limits  Plan:  --Persistent hypernatremia despite increase in free water flushes and DDAVP: on dextrose 5 % solution at 125 mL/hr --K 5.8, Scr continues to improve. Continue with Veltassa 16.8 g daily  --Re-check electrolytes with AM labs tomorrow  Dallie Piles  02/24/2022 9:20 AM

## 2022-02-24 NOTE — Progress Notes (Signed)
recruitment maneuver done with vent check.

## 2022-02-24 NOTE — Progress Notes (Signed)
Palliative: Mr. Minerva Fester is lying quietly in bed.  He is intubated/ventilated and sedated.  Bedside nursing staff is present attending needs.  Present also today is daughter/HC POA, [employer].   Destiny and I talked about Mr. Elease Hashimoto acute health concerns.  We talked about plan for tracheostomy tomorrow.  Overall, destiny asks appropriate questions about tracheostomy and future needs.  Reassurance provided that the medical team and transitional care team will make sure that his needs are met, they will not have to manage on their own.  Conference with attending, bedside nursing staff, transition of care team related to patient condition, needs, goals of care, disposition.  Plan: Continue full scope/full code, tentative plan for tracheostomy 8/8.  PMT to follow for support, as goals are set.  52 minutes Quinn Axe, NP Palliative medicine team Team phone [phone] Greater than 50% of this time was spent counseling and coordinating care related to the above assessment and plan.

## 2022-02-24 NOTE — Inpatient Diabetes Management (Signed)
Inpatient Diabetes Program Recommendations  AACE/ADA: New Consensus Statement on Inpatient Glycemic Control   Target Ranges:  Prepandial:   less than 140 mg/dL      Peak postprandial:   less than 180 mg/dL (1-2 hours)      Critically ill patients:  140 - 180 mg/dL    Latest Reference Range & Units 02/24/22 00:10 02/24/22 03:14 02/24/22 07:37  Glucose-Capillary 70 - 99 mg/dL 199 (H) 279 (H) 243 (H)    Latest Reference Range & Units 02/23/22 03:15 02/23/22 07:37 02/23/22 11:51 02/23/22 15:40 02/23/22 20:03  Glucose-Capillary 70 - 99 mg/dL 151 (H) 122 (H) 157 (H) 253 (H) 219 (H)   Review of Glycemic Control  Current orders for Inpatient glycemic control: Semglee 25 units BID, Novolog 8 units Q4H, Novolog 0-20 units Q4H; Decadron 8 mg Q8H, Vital @ 45 ml/hr   Inpatient Diabetes Program Recommendations:     Insulin: If steroids are continued as ordered, please consider increasing Semglee to 30 units BID.  Thanks, Barnie Alderman, RN, MSN, [place] [facility] Coordinator Inpatient Diabetes Program [phone] (Team Pager from 8am to [place])

## 2022-02-24 NOTE — Progress Notes (Signed)
Recruitment maneuver done with vent check

## 2022-02-24 NOTE — Progress Notes (Signed)
NAME:  Samuel Chavez, MRN:  [id number], DOB:  Nov 29, 1975, LOS: 13 ADMISSION DATE:  02/11/2022, CONSULTATION DATE: 02/11/22 REFERRING MD:  Valente David MD  CHIEF COMPLAINT: SOB   CC  follow up RESP FAILURE/acute CHF exacerbation  HPI/SYNOPSIS  46 y.o male with significant PMH as below who presented to the ED with chief complaints of SOB and worsening bilateral lower extremities edema.  ED Course: In the emergency department, the temperature was 37.3C, the heart rate 99 beats/minute, the blood pressure 187/102 mm Hg, the respiratory rate 20 breaths/minute, and the oxygen saturation 89% on. He was noted to be drowsy but still protecting his airway and responding appropriately.  Pertinent INITIAL Labs/Diagnostics Findings: Chemistry:Glucose:319 Calcium: 8.8 otherwise unremarkable CBC: unremarkable Other Lab findings: ABG with hypoxemia and hypercapnia, .  Imaging: Chest x-ray showed bilateral infiltrates worse on right    Significant Hospital Events   7/25: Admitted to hospitalist service w/acute on chronic hypoxic hypercapnic resp. failure requiring BiPAP.Failed BiPAP and intubated. PCCM consulted 7/26: INTUBATED, difficlut to sedate, started KETAMINE INFUSION 7/27: severe hypoxia, PEEP increased to 15 but decreased back to 10 7/28: Persistent severe hypoxia, ventilator changes made 7/29: Persistent hypoxia, recruitment maneuvers instituted, ventilator changes made, empiric heparin as patient cannot be scanned query PE -02/17/22- patient is severely critically ill with bilateral multifocal pneumonia on sedation and paralysis with mechanical ventilation maximal settings.  He is at cusp of death, we met with daughter today and discussed severity of illness. Unable to perform CT due to severity of critical illness. Met with previous PCCM doc and discussed medical plan.  Patient had recruitment maneuvers last few days without improvement, on heparin gtt for possible PE.   02/18/22- patient  continues to require maximal settings on ventilator despite good UOP.  He destaturated to <50% spO2 on MV today required BGV.   02/19/22- patient continues to require 100% FiO2 on maximal setting with high PEEP ladder for possible ARDS.  He was diuresed and on steroids with development of AKI.  His CXR had slight interval improvement on right. We discussed case with vascular surgery regarding possible empiric tPA for PE.  02/20/22- patient was unable to get CT today due to continued severe hypoxemia.  Daughter and other family members at bedside we reviewed case and medical plan.  There seems to be some confusion from family as they had asked me to wake patient up and take off ventilator even though I had explained multiple times that he is at very high risk for death.  They laughed at my comments and seemed to think it was not true. We may still be able to do bronchoscopy but its high risk.  We have reduced IV infusions by switching some medications to OGT route.  02/21/22- patient is weaned to 60%FiO2.  Plan for possible bronch if patient is tolerating.  Family at bedside.  If able to we will obtain more imaging and VQ scan to rule out PE.  02/22/22- Patient weaned to 50% on PRVC.  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 CBC stable , CMP with improved GFR. Monitoring Na, pharmacy and renal following for electrolytes/renal function.  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  Overall marked improvement on ventilator over past 48h. Met with daughter at bedside reviewed medical plan.  02/24/22-Vent requirements continue to slowly improve, currently 40% FiO2 and 14 PEEP.  AKI continues to slowly improve, remains  hyperkalemic with potassium of 5.8.  Placed back on Veltassa, Diurese x1.  Trach scheduled for tomorrow at 1:15 PM  Consults:  PCCM Nephrology Vascular Surgery Palliative Care ENT  Procedures:  7/25:  Intubation 7/26: PICC triple-lumen, right basilic vein  Significant Diagnostic Tests:  7/25  Chest Xray:Chest x-ray showed cardiomegaly and pulmonary vascular congestion without overt pulmonary edema 7/25 Echocardiogram: difficult study, LVEF was estimated at 55 to 60%, dilated LV moderate LVH, grade 1 DD, enlargement of the right ventricle, left atrial size moderately dilated, right atrial size moderately dilated this is consistent with restrictive physiology (echo reading not congruous with prior echoes from [facility]) 7/29: Venous US BLE>>IMPRESSION: No lower extremity DVT. 7/30 Limited echo: LVEF 40 to 45% decreased LV function, no wall motion abnormalities, moderate dilation of the LV concentric LVH, right ventricular size moderately enlarged, right atrial enlargement, this is more consistent with prior [facility] scans 8/4: CT Head>>IMPRESSION: 1. No acute intracranial abnormality. 2. Paranasal sinusitis. 3. Bilateral complete opacification of the mastoid air cells and inner ears, as can be seen in the setting of otitis media. Correlate with symptoms. 8/4: CT Chest/Abdomen/Pelvis>>IMPRESSION: Extensive ground-glass, alveolar and patchy dense infiltrates in both lungs suggesting multifocal pneumonia. Part of this finding may suggest underlying pulmonary edema. Small bilateral pleural effusions are seen. Cardiomegaly. There is ectasia of the main pulmonary artery suggesting pulmonary arterial hypertension. There is no evidence of intestinal obstruction or pneumoperitoneum. There is no hydronephrosis. Appendix is not dilated. UB diverticula are seen in the colon without signs of focal diverticulitis. Severe degenerative changes are noted at L4-L5 level with significant interval worsening. Findings may be due to severe disc degeneration. If there is clinical suspicion for discitis or osteomyelitis, follow-up MRI may be considered 8/4: Lung  V/Q>>IMPRESSION: Pulmonary embolism absent.  Micro Data:  7/25: SARS-CoV-2 PCR> negative 7/25: MRSA PCR>> NEG 7/27: Strep pneumoniae Ag>> negative 7/27: Legionella Ag>> negative 7/27: Mycoplasma>> <770 7/27: Sputum>> Strep pneumo,Haemophilus influenzae 7/30 Sputum cult >> negative 8/4: Tracheal aspirate>>negative  Antimicrobials:  Cefepime 7/27 >>7/31 Ceftriaxone 7/31>>8/4 Vancomycin 7/27 >> 7/29, restarted 7/30 (resumed due to fever spike on Maxipime) ? Resistant Strep pneumo >> 7/31    REVIEW OF SYSTEMS Patient is unable to provide complete review of systems due to severe critical illness/ventilator dependence  OBJECTIVE  Blood pressure (!) 115/103, pulse 85, temperature 98.6 F (37 C), temperature source Esophageal, resp. rate (!) 30, height 5' 9.02" (1.753 m), weight (!) 205.9 kg, SpO2 (!) 89 %. CVP:  [15 mmHg-24 mmHg] 19 mmHg   Vent Mode: PRVC FiO2 (%):  [40 %-55 %] 40 % Set Rate:  [30 bmp] 30 bmp Vt Set:  [500 mL] 500 mL PEEP:  [12 cmH20-14 cmH20] 14 cmH20 Plateau Pressure:  [29 cmH20-30 cmH20] 29 cmH20   Intake/Output Summary (Last 24 hours) at 02/24/2022 0848 Last data filed at 02/24/2022 0349 Gross per 24 hour  Intake 5630.84 ml  Output 3375 ml  Net 2255.84 ml    Filed Weights   02/22/22 0200 02/23/22 0500 02/24/22 0500  Weight: (!) 205 kg (!) 205.4 kg (!) 205.9 kg   PHYSICAL EXAMINATION: GENERAL: Acute on chronically ill appearing, Morbidly obese male, intubated, mechanically ventilated, sedated, in NAD HEAD: Normocephalic, atraumatic. EYES: Pupils equal, round, reactive to light.  No scleral icterus.  MOUTH: Macroglossia (massive).  Orotracheally intubated, OG in place. NECK: Supple. No thyromegaly. Trachea midline. Difficult to assess JVD due to body habitus.  No adenopathy. PULMONARY: coarse breath sounds bilaterally, even, synchronous with vent.  CARDIOVASCULAR: RRR, S1S2, no M/R/G ABDOMEN: Obese, soft, nondistended, tolerating tube  feeds. MUSCULOSKELETAL: No joint deformity, no clubbing, no edema.  SCDs in place. NEUROLOGIC: Heavily Sedated, withdraws from pain, pupils PERRL (sluggish 2 mm bilaterally) SKIN: Intact,warm,dry.  Chronic stasis changes in the lower extremities. PSYCH: Not assessed due to mechanically ventilated status.  Labs/imaging that I havepersonally reviewed    Labs   CBC: Recent Labs  Lab 02/20/22 0359 02/21/22 0354 02/22/22 0424 02/23/22 0317 02/24/22 0313  WBC 16.6* 16.2* 15.3* 16.0* 15.6*  NEUTROABS 14.0* 13.7* 12.3* 13.3* 13.7*  HGB 9.7* 9.7* 9.4* 9.4* 9.4*  HCT 34.6* 35.3* 34.0* 33.8* 34.1*  MCV 95.8 95.9 94.4 95.8 94.5  PLT 192 192 194 180 193     Basic Metabolic Panel: Recent Labs  Lab 02/20/22 0359 02/21/22 0354 02/22/22 0424 02/22/22 0919 02/22/22 1450 02/22/22 2038 02/23/22 0317 02/23/22 2004 02/24/22 0313  NA 151* 156* 160* 159* 160* 160* 159* 157* 155*  K 5.4* 5.6* 5.2*  --   --   --  4.9  --  5.8*  CL 117* 122* 124*  --   --   --  125*  --  122*  CO2 $Re'27 27 27  'zUN$ --   --   --  28  --  26  GLUCOSE 322* 273* 192*  --   --   --  175*  --  300*  BUN 125* 143* 144*  --   --   --  129*  --  117*  CREATININE 3.94* 3.52* 2.77*  --   --   --  2.27*  --  1.87*  CALCIUM 8.4* 8.6* 8.5*  --   --   --  8.3*  --  8.2*  MG 3.3* 3.2*  --  2.9*  --   --  3.0*  --  3.4*  PHOS 4.8* 5.3*  --  4.1  --   --  4.6  --  4.8*    GFR: Estimated Creatinine Clearance: 89 mL/min (A) (by C-G formula based on SCr of 1.87 mg/dL (H)). Recent Labs  Lab 02/19/22 0920 02/20/22 0359 02/21/22 0354 02/22/22 0424 02/23/22 0317 02/24/22 0313  PROCALCITON 8.62 7.30  --   --   --   --   WBC  --  16.6* 16.2* 15.3* 16.0* 15.6*     Liver Function Tests: No results for input(s): "AST", "ALT", "ALKPHOS", "BILITOT", "PROT", "ALBUMIN" in the last 168 hours.  ABG    Component Value Date/Time   PHART 7.33 (L) 02/23/2022 2004   PCO2ART 59 (H) 02/23/2022 2004   PO2ART 53 (L) 02/23/2022 2004   HCO3  31.1 (H) 02/23/2022 2004   TCO2 26 03/17/2007 1822   O2SAT 82.6 02/23/2022 2004     Home Medications  Prior to Admission medications   Medication Sig Start Date End Date Taking? Authorizing Provider  losartan (COZAAR) 100 MG tablet Take 100 mg by mouth daily. 02/01/22  Yes [provider]  OZEMPIC, 0.25 OR 0.5 MG/DOSE, 2 MG/3ML SOPN SMARTSIG:0.25 Milligram(s) SUB-Q Once a Week 12/09/21  Yes [provider]  torsemide (DEMADEX) 20 MG tablet Take 20 mg by mouth daily. 12/18/21  Yes [provider]  Aspirin-Salicylamide-Caffeine (BC HEADACHE POWDER PO) Take 1 packet by mouth as needed (for pain).    [provider]  cephALEXin (KEFLEX) 500 MG capsule Take 1 capsule (500 mg total) by mouth 4 (four) times daily. Patient not taking: Reported on 02/11/2022 12/16/18   Davonna Belling, MD  cyclobenzaprine (  FLEXERIL) 10 MG tablet Take 0.5-1 tablets (5-10 mg total) by mouth 2 (two) times daily as needed for muscle spasms. Patient not taking: Reported on 02/11/2022 03/12/18   Arthor Captain, PA-C  gabapentin (NEURONTIN) 600 MG tablet Take 600 mg by mouth at bedtime. Patient not taking: Reported on 02/11/2022 10/13/21   [provider]  LABETALOL HCL PO Take by mouth. Patient not taking: Reported on 02/11/2022    [provider]  meloxicam (MOBIC) 15 MG tablet Take 1 tablet (15 mg total) by mouth daily. Take 1 daily with food. Patient not taking: Reported on 02/11/2022 03/12/18   Arthor Captain, PA-C  metFORMIN (GLUCOPHAGE) 500 MG tablet Take 500 mg by mouth 2 (two) times daily with a meal. Patient not taking: Reported on 02/11/2022    [provider]  METFORMIN HCL ER, MOD, PO Take by mouth. Patient not taking: Reported on 02/11/2022    [provider]  METOPROLOL SUCCINATE ER PO Take by mouth. Patient not taking: Reported on 02/11/2022    [provider]  oxyCODONE-acetaminophen (PERCOCET) 10-325 MG tablet Take 1 tablet by mouth  See admin instructions. Take 1 tablet by mouth 4-5 times a day as needed for pain Patient not taking: Reported on 02/11/2022    [provider]  oxyCODONE-acetaminophen (PERCOCET/ROXICET) 5-325 MG tablet Take 1 tablet by mouth every 8 (eight) hours as needed for severe pain. Patient not taking: Reported on 02/11/2022 12/16/18   Benjiman Core, MD  predniSONE (DELTASONE) 20 MG tablet Take 2 tablets (40 mg total) by mouth daily. Patient not taking: Reported on 02/11/2022 04/04/15   Benjiman Core, MD   Hospital Scheduled Meds:  artificial tears  1 Application Both Eyes Q8H   Chlorhexidine Gluconate Cloth  6 each Topical Q0600   dexamethasone (DECADRON) injection  8 mg Intravenous Q12H   feeding supplement (PROSource TF)  90 mL Per Tube 5 X Daily   free water  250 mL Per Tube Q2H   heparin injection (subcutaneous)  5,000 Units Subcutaneous Q8H   insulin aspart  0-20 Units Subcutaneous Q4H   insulin aspart  8 Units Subcutaneous Q4H   insulin glargine-yfgn  25 Units Subcutaneous BID   insulin starter kit- pen needles  1 kit Other Once   ipratropium-albuterol  3 mL Nebulization Q6H   living well with diabetes book   Does not apply Once   multivitamin  1 tablet Per Tube QHS   mouth rinse  15 mL Mouth Rinse Q2H   oxyCODONE  10 mg Per Tube Q6H   pantoprazole sodium  40 mg Per Tube QHS   phenobarbital  97.2 mg Per Tube TID   polyethylene glycol  17 g Per Tube Daily   senna-docusate  1 tablet Per Tube BID   sodium chloride flush  10-40 mL Intracatheter Q12H   sodium chloride flush  3 mL Intravenous Q12H   Continuous Infusions:  sodium chloride 250 mL (02/19/22 1011)   sodium chloride Stopped (02/11/22 2011)   dextrose 125 mL/hr at 02/24/22 0323   feeding supplement (VITAL 1.5 CAL) 45 mL/hr at 02/23/22 1800   fentaNYL infusion INTRAVENOUS 200 mcg/hr (02/24/22 0458)   midazolam 6 mg/hr (02/24/22 0630)   PRN Meds:.sodium chloride, acetaminophen **OR** acetaminophen, docusate sodium,  fentaNYL, midazolam, ondansetron **OR** ondansetron (ZOFRAN) IV, mouth rinse, polyethylene glycol, sodium chloride flush, sodium chloride flush, traZODone, vecuronium      Active Hospital Problem list    Patient Active Problem List   Diagnosis Date Noted  Acute respiratory failure ([place]) 02/11/2022   Respiratory failure ([place]) 02/11/2022   Acute respiratory failure with hypoxia and hypercarbia ([facility]) 02/11/2022   Acute on chronic systolic CHF (congestive heart failure) ([place]) 02/11/2022   Hypertensive urgency 02/11/2022   Type 2 diabetes mellitus with peripheral neuropathy ([place]) 02/11/2022   OBSTRUCTIVE SLEEP APNEA 04/30/2009    Assessment & Plan:   46 yo morbidly obese AAM with severe decompensated acute systolic and diastolic heart failure with previously documented severe OSA/OHS, noncompliant with CPAP, active smoker, leading to severe hypoxic resp failure with encephalopathy and difficulty with sedation   Severe acute on chronic hypoxic and hypercapnic respiratory failure due to CAP & acute decompensation of chronic heart failure Persistent hypoxemia despite trial of several ventilator strategies -Full vent support, implement lung protective strategies -Plateau pressures less than 30 cm H20 -Wean FiO2 & PEEP as tolerated to maintain O2 sats >88% -Follow intermittent Chest X-ray & ABG as needed -Spontaneous Breathing Trials when respiratory parameters met and mental status permits -Implement VAP Bundle -Prn Bronchodilators -will NOT perform SAT/SBT due to high FiO2 requirements, asynchrony off sedation  -Recruitment maneuvers every 4h and as needed. -Patient cannot be proned, due to weight limit for safe proning and unstable airway (ET tube with constant need of repositioning) -s/p bronchoscopy with removal of ispissated mucus plugging bilaterally with interval improvement post procedure. -Plan for Tracheostomy 8/8   Community-acquired pneumonia: Streptococcus pneumoniae and  Haemophilus influenzae ~ TREATED -Monitor fever curve -Trend WBC's & Procalcitonin -Follow cultures as above -Completed course of ABX as above  Acute on chronic systolic and diastolic heart failure LVEF 40 to 45% consistent with prior studies performed at [facility] -Continuous cardiac monitoring -Maintain MAP >65 -Vasopressors as needed to maintain MAP goal ~ weaned off -Trend lactic acid until normalized -Trend HS Troponin until peaked -2D Echocardiogram shows diastolic dysfunction and enlarged left and right atria consistent with restrictive physiology -Diuresis as blood pressure and renal function permits ~ will give 40 mg IV Lasix x1 dose 8/7  ACUTE KIDNEY INJURY/Renal Failure -STAGE 4, Element of cardiorenal syndrome ~ IMPROVING Hypernatremia ~ 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 -Monitor I&O's / urinary output -Follow BMP -Ensure adequate renal perfusion -Avoid nephrotoxic agents as able -Replace electrolytes as indicated -Nephrology- on case, appreciate input      Intake/Output Summary (Last 24 hours) at 02/24/2022 0848 Last data filed at 02/24/2022 0349 Gross per 24 hour  Intake 5630.84 ml  Output 3375 ml  Net 2255.84 ml        Latest Ref Rng & Units 02/24/2022    3:13 AM 02/23/2022    8:04 PM 02/23/2022    3:17 AM  BMP  Glucose 70 - 99 mg/dL 300   175   BUN 6 - 20 mg/dL 117   129   Creatinine 0.61 - 1.24 mg/dL 1.87   2.27   Sodium 135 - 145 mmol/L 155  157  159   Potassium 3.5 - 5.1 mmol/L 5.8   4.9   Chloride 98 - 111 mmol/L 122   125   CO2 22 - 32 mmol/L 26   28   Calcium 8.9 - 10.3 mg/dL 8.2   8.3     Diabetes Mellitus -CBG's q4h; Target range of 140 to 180 -SSI -Follow ICU Hypo/Hyperglycemia protocol -HgbA1c  10.1 -Hold Metformin  GI GI PROPHYLAXIS PPI  NUTRITIONAL STATUS DIET-->TF's as tolerated Constipation protocol as  indicated  ACUTE ANEMIA- -Monitor  for S/Sx of bleeding -Trend CBC -Heparin SQ for VTE Prophylaxis  -Transfuse for Hgb <7     Diet: Tube feeds Pain/Anxiety/Delirium protocol (if indicated): Yes (RASS goal -1 to -2) VAP protocol (if indicated): Yes DVT prophylaxis: Heparin SQ GI prophylaxis: PPI Glucose control:  SSI Yes Central venous access:  PICC, and is still needed Arterial line:  yes, and is still needed Foley:  Yes, and it is still needed Mobility:  bed rest  PT consulted: N/A Last date of multidisciplinary goals of care discussion [8/7] Code Status:  full code Disposition: ICU  8/7: Pt's daughter updated at bedside.  Gives consent for Tracheostomy tomorrow 8/8.   IMAGING    -CT CHEST WITH MULTIFOCAL BILATERAL PNEUMONIA    Critical care provider statement:   Total critical care time: 40 minutes   Darel Hong, AGACNP-BC [facility] Prefer epic messenger for cross cover needs If after hours, please call E-link    Critical care was necessary to treat or prevent imminent or life-threatening deterioration.

## 2022-02-24 NOTE — Progress Notes (Signed)
[place], [place] 02/24/22  Subjective:   Hospital day # 13  [name] is a 46 y.o.  male with past medical history of diabetes, hypertension, sCHF, OSA, and obesity, who was admitted to [facility] on 02/11/2022 for Acute respiratory failure ([place]) [J96.00] Respiratory failure ([place]) [J96.90] Peripheral edema [R60.9] Acute respiratory failure with hypoxia and hypercapnia ([place]) [J96.01, J96.02]  We have been re-consulted for elevated renal function. We were consulted during the initial admission for acute kidney injury which was corrected with IV Furosemide. Creatinine began to increase on 02/18/22.   Sedated on Fentanyl and versed Vent- 40 % FiO2  Heparin drip remains for PE protocol Tube feeds- 46ml/hr Foley and flexiseal placed, good urine output No lower extremity edema  Renal: 08/06 0701 - 08/07 0700 In: 5630.8 [I.V.:1474.1; NG/GT:4156.8] Out: 0211 [Urine:3375] Lab Results  Component Value Date   CREATININE 1.87 (H) 02/24/2022   CREATININE 2.27 (H) 02/23/2022   CREATININE 2.77 (H) 02/22/2022     Objective:  Vital signs in last 24 hours:  Temp:  [98.4 F (36.9 C)-99.3 F (37.4 C)] 99.3 F (37.4 C) (08/07 1200) Pulse Rate:  [60-93] 60 (08/07 1205) Resp:  [0-30] 24 (08/07 1205) BP: (108-127)/(60-103) 127/70 (08/07 1200) SpO2:  [87 %-95 %] 95 % (08/07 1205) Arterial Line BP: (67-120)/(51-65) 113/51 (08/07 1205) FiO2 (%):  [40 %-55 %] 40 % (08/07 1200) Weight:  [205.9 kg] 205.9 kg (08/07 0500)  Weight change: 0.5 kg Filed Weights   02/22/22 0200 02/23/22 0500 02/24/22 0500  Weight: (!) 205 kg (!) 205.4 kg (!) 205.9 kg    Intake/Output:    Intake/Output Summary (Last 24 hours) at 02/24/2022 1250 Last data filed at 02/24/2022 1000 Gross per 24 hour  Intake 7375.16 ml  Output 3025 ml  Net 4350.16 ml      Physical Exam: General: Critically ill-appearing, laying in the bed  HEENT ET tube, OG tube in place, angioedema   Pulm/lungs Ventilator assisted, coarse breath sounds  CVS/Heart Regular  Abdomen:  Soft, nontender, nondistended  Extremities: Trace dependent edema  Neurologic: Sedated  Skin: Warm, dry  Access:        Basic Metabolic Panel:  Recent Labs  Lab 02/20/22 0359 02/21/22 0354 02/22/22 0424 02/22/22 0919 02/22/22 1450 02/22/22 2038 02/23/22 0317 02/23/22 2004 02/24/22 0313  NA 151* 156* 160* 159* 160* 160* 159* 157* 155*  K 5.4* 5.6* 5.2*  --   --   --  4.9  --  5.8*  CL 117* 122* 124*  --   --   --  125*  --  122*  CO2 $Re'27 27 27  'hSX$ --   --   --  28  --  26  GLUCOSE 322* 273* 192*  --   --   --  175*  --  300*  BUN 125* 143* 144*  --   --   --  129*  --  117*  CREATININE 3.94* 3.52* 2.77*  --   --   --  2.27*  --  1.87*  CALCIUM 8.4* 8.6* 8.5*  --   --   --  8.3*  --  8.2*  MG 3.3* 3.2*  --  2.9*  --   --  3.0*  --  3.4*  PHOS 4.8* 5.3*  --  4.1  --   --  4.6  --  4.8*      CBC: Recent Labs  Lab 02/20/22 0359 02/21/22 0354 02/22/22 0424 02/23/22 0317 02/24/22  0313  WBC 16.6* 16.2* 15.3* 16.0* 15.6*  NEUTROABS 14.0* 13.7* 12.3* 13.3* 13.7*  HGB 9.7* 9.7* 9.4* 9.4* 9.4*  HCT 34.6* 35.3* 34.0* 33.8* 34.1*  MCV 95.8 95.9 94.4 95.8 94.5  PLT 192 192 194 180 193      No results found for: "HEPBSAG", "HEPBSAB", "HEPBIGM"    Microbiology:  Recent Results (from the past 240 hour(s))  Culture, Respiratory w Gram Stain     Status: None   Collection Time: 02/16/22  4:27 PM   Specimen: Tracheal Aspirate; Respiratory  Result Value Ref Range Status   Specimen Description   Final    TRACHEAL ASPIRATE Performed at [facility], [address] [place], [place] [address]    Special Requests   Final    NONE Performed at [facility], [address] [place], [place] [address]    Gram Stain   Final    RARE WBC PRESENT,BOTH PMN AND MONONUCLEAR NO ORGANISMS SEEN    Culture   Final    NO GROWTH Performed at [facility], [place] [address],  [place], [place] [address]    Report Status 02/20/2022 FINAL  Final  Culture, Respiratory w Gram Stain     Status: None   Collection Time: 02/21/22 11:41 AM   Specimen: Bronchoalveolar Lavage; Respiratory  Result Value Ref Range Status   Specimen Description   Final    BRONCHIAL ALVEOLAR LAVAGE Performed at [facility], [address] [place], [place] [address]    Special Requests   Final    NONE Performed at [facility], [address] [place], [place] [address]    Gram Stain   Final    FEW WBC PRESENT,BOTH PMN AND MONONUCLEAR NO ORGANISMS SEEN    Culture   Final    NO GROWTH 2 DAYS Performed at [facility], [place] [address] [place], [place] [address]    Report Status 02/23/2022 FINAL  Final    Coagulation Studies: No results for input(s): "LABPROT", "INR" in the last 72 hours.  Urinalysis: No results for input(s): "COLORURINE", "LABSPEC", "PHURINE", "GLUCOSEU", "HGBUR", "BILIRUBINUR", "KETONESUR", "PROTEINUR", "UROBILINOGEN", "NITRITE", "LEUKOCYTESUR" in the last 72 hours.  Invalid input(s): "APPERANCEUR"    Imaging: DG Chest Port 1 View  Result Date: 02/23/2022 CLINICAL DATA:  [id number]; intubation EXAM: PORTABLE CHEST 1 VIEW COMPARISON:  Radiograph dated August 2,4, 2023 FINDINGS: Evaluation is limited by underpenetration, patient rotation and extensive grid line artifact. The cardiomediastinal silhouette is unchanged and enlarged in contour.ETT tip terminates 6.5 cm above the carina. Enteric tube courses past the carina; tip is not well visualized secondary to poor penetration. It is favored to be below the diaphragm. RIGHT upper extremity PICC tip is estimated to terminate over the superior cavoatrial junction. No large pleural effusion. No large pneumothorax. Diffuse bilateral airspace opacities, grossly similar comparison to prior given suboptimal technique. These are increased in the RIGHT lung in comparison to August 2nd with a persistent LEFT  retrocardiac opacity in the LEFT lung and mildly improved aeration of the LEFT upper lung compared to August 2nd. IMPRESSION: Limited examination. Support apparatus as described above. Diffuse bilateral airspace opacities, favored to be similar comparison to most recent prior but overall worsened in the RIGHT lung since August 2nd. Electronically Signed   By: Valentino Saxon M.D.   On: 02/23/2022 12:37     Medications:    sodium chloride 250 mL (02/19/22 1011)   sodium chloride Stopped (02/11/22 2011)   dextrose  125 mL/hr at 02/24/22 1139   feeding supplement (VITAL 1.5 CAL) 45 mL/hr at 02/24/22 1000   fentaNYL infusion INTRAVENOUS 200 mcg/hr (02/24/22 1000)   midazolam 6 mg/hr (02/24/22 1000)    artificial tears  1 Application Both Eyes Q3E   Chlorhexidine Gluconate Cloth  6 each Topical Q0600   feeding supplement (PROSource TF)  90 mL Per Tube 5 X Daily   free water  250 mL Per Tube Q2H   heparin injection (subcutaneous)  5,000 Units Subcutaneous Q8H   insulin aspart  0-20 Units Subcutaneous Q4H   insulin aspart  8 Units Subcutaneous Q4H   insulin glargine-yfgn  25 Units Subcutaneous BID   insulin starter kit- pen needles  1 kit Other Once   ipratropium-albuterol  3 mL Nebulization Q6H   living well with diabetes book   Does not apply Once   multivitamin  1 tablet Per Tube QHS   mouth rinse  15 mL Mouth Rinse Q2H   oxyCODONE  10 mg Per Tube Q6H   pantoprazole sodium  40 mg Per Tube QHS   patiromer  16.8 g Oral Daily   phenobarbital  97.2 mg Per Tube TID   polyethylene glycol  17 g Per Tube Daily   senna-docusate  1 tablet Per Tube BID   sodium chloride flush  10-40 mL Intracatheter Q12H   sodium chloride flush  3 mL Intravenous Q12H   sodium chloride, acetaminophen **OR** acetaminophen, docusate sodium, fentaNYL, midazolam, ondansetron **OR** ondansetron (ZOFRAN) IV, mouth rinse, polyethylene glycol, sodium chloride flush, sodium chloride flush, traZODone,  vecuronium  Assessment/ Plan:  46 y.o. male with medical problems of poorly controlled diabetes, hypertension, systolic CHF, obstructive sleep apnea, obesity    admitted on 02/11/2022 for Acute respiratory failure (HCC) [J96.00] Respiratory failure (HCC) [J96.90] Peripheral edema [R60.9] Acute respiratory failure with hypoxia and hypercapnia (HCC) [J96.01, J96.02]  #Acute kidney injury with hyperkalemia/hypernatremia.  Baseline creatinine 0.96 from February 11, 2022. Suspected secondary to fever, hypotension and sepsis. No IV contrast exposure or nephrotoxic agents. CK 663,   Veltassa discontinued yesterday, but restarted today due to elevated potassium, 5.8.  Continue D5W IV  125cc/hr for hypernatremia  Creatinine improved 1.87, with UOP 3.3L   #Acute respiratory failure Ventilator assisted.  Management as per ICU team.  Vent weaned to 40%.   #Acute exacerbation of systolic and diastolic CHF 2D echo from February 11, 2022-LVEF 55 to 60%, moderate LVH, grade 1 diastolic dysfunction, enlarged right ventricle, moderately dilated right atrium, moderately dilated left atrium  #Poorly controlled diabetes type 2  Lab Results  Component Value Date   HGBA1C 10.1 (H) 02/11/2022  Glucoses elevated, sliding scale insulin managed by primary team    LOS: [address] 8/7/202312:50 PM  [place], [place]   [facility] 8/7/202312:50 PM

## 2022-02-24 NOTE — Anesthesia Preprocedure Evaluation (Signed)
Anesthesia Evaluation  Patient identified by MRN, date of birth, ID band Patient unresponsive  General Assessment Comment:Patient is currently intubated, sedated, and on mechanical ventilation   Reviewed: Allergy & Precautions, NPO status , Patient's Chart, lab work & pertinent test results  History of Anesthesia Complications Negative for: history of anesthetic complications  Airway Mallampati: Intubated  TM Distance: >3 FB     Dental  (+) Chipped   Pulmonary neg shortness of breath, sleep apnea , Current Smoker,    Pulmonary exam normal        Cardiovascular hypertension, +CHF  (-) Past MI  + Peripheral Edema Peripheral edema  Acute exacerbation of systolic and diastolic CHF 2D echo from February 11, 2022-LVEF 55 to 60%, moderate LVH, grade 1 diastolic dysfunction, enlarged right ventricle, moderately dilated right atrium, moderately dilated left atrium   Neuro/Psych  Neuromuscular disease negative psych ROS   GI/Hepatic negative GI ROS, Neg liver ROS, neg GERD  ,  Endo/Other  diabetes, Poorly Controlled, Type 2, Insulin DependentMorbid obesity  Renal/GU Renal diseaseacute kidney injury     Musculoskeletal   Abdominal (+) + obese,   Peds  Hematology negative hematology ROS (+)   Anesthesia Other Findings 46 y/o male with h/o COPD, CHF, HTN, DM and OSA who is admitted with CHF exacerbation, CAP and severe ARDS.  Past Medical History: No date: Diabetes mellitus without complication (HCC) No date: Hypertension No date: Pancreatitis  Past Surgical History: No date: CHOLECYSTECTOMY  BMI    Body Mass Index: 67.00 kg/m      Reproductive/Obstetrics negative OB ROS                            Anesthesia Physical Anesthesia Plan  ASA: 4  Anesthesia Plan: General ETT   Post-op Pain Management:    Induction: Intravenous  PONV Risk Score and Plan: Ondansetron, Dexamethasone, Midazolam  and Treatment may vary due to age or medical condition  Airway Management Planned: Oral ETT  Additional Equipment:   Intra-op Plan:   Post-operative Plan: Post-operative intubation/ventilation  Informed Consent: I have reviewed the patients History and Physical, chart, labs and discussed the procedure including the risks, benefits and alternatives for the proposed anesthesia with the patient or authorized representative who has indicated his/her understanding and acceptance.     Dental Advisory Given  Plan Discussed with: Anesthesiologist, CRNA and Surgeon  Anesthesia Plan Comments: (Daughter consented for risks of anesthesia including but not limited to:  - adverse reactions to medications - damage to eyes, teeth, lips or other oral mucosa - nerve damage due to positioning  - sore throat or hoarseness - Damage to heart, brain, nerves, lungs, other parts of body or loss of life  She voiced understanding.)        Anesthesia Quick Evaluation

## 2022-02-24 NOTE — Progress Notes (Signed)
Recruitment done

## 2022-02-25 ENCOUNTER — Inpatient Hospital Stay: Payer: Medicaid Other | Admitting: Anesthesiology

## 2022-02-25 ENCOUNTER — Encounter
Admission: EM | Disposition: A | Discharge status: Rehabilitation Facility | Payer: Self-pay | Source: Home / Self Care | Attending: Internal Medicine

## 2022-02-25 ENCOUNTER — Inpatient Hospital Stay: Payer: Medicaid Other

## 2022-02-25 DIAGNOSIS — Z7189 Other specified counseling: Secondary | ICD-10-CM | POA: Diagnosis not present

## 2022-02-25 DIAGNOSIS — Z515 Encounter for palliative care: Secondary | ICD-10-CM | POA: Diagnosis not present

## 2022-02-25 DIAGNOSIS — J9602 Acute respiratory failure with hypercapnia: Secondary | ICD-10-CM | POA: Diagnosis not present

## 2022-02-25 DIAGNOSIS — J9601 Acute respiratory failure with hypoxia: Secondary | ICD-10-CM | POA: Diagnosis not present

## 2022-02-25 HISTORY — PX: TRACHEOSTOMY TUBE PLACEMENT: SHX814

## 2022-02-25 LAB — GLUCOSE, CAPILLARY
Glucose-Capillary: 113 mg/dL — ABNORMAL HIGH (ref 70–99)
Glucose-Capillary: 122 mg/dL — ABNORMAL HIGH (ref 70–99)
Glucose-Capillary: 139 mg/dL — ABNORMAL HIGH (ref 70–99)
Glucose-Capillary: 143 mg/dL — ABNORMAL HIGH (ref 70–99)
Glucose-Capillary: 148 mg/dL — ABNORMAL HIGH (ref 70–99)
Glucose-Capillary: 153 mg/dL — ABNORMAL HIGH (ref 70–99)
Glucose-Capillary: 154 mg/dL — ABNORMAL HIGH (ref 70–99)
Glucose-Capillary: 155 mg/dL — ABNORMAL HIGH (ref 70–99)
Glucose-Capillary: 162 mg/dL — ABNORMAL HIGH (ref 70–99)
Glucose-Capillary: 163 mg/dL — ABNORMAL HIGH (ref 70–99)
Glucose-Capillary: 164 mg/dL — ABNORMAL HIGH (ref 70–99)
Glucose-Capillary: 178 mg/dL — ABNORMAL HIGH (ref 70–99)
Glucose-Capillary: 185 mg/dL — ABNORMAL HIGH (ref 70–99)
Glucose-Capillary: 196 mg/dL — ABNORMAL HIGH (ref 70–99)
Glucose-Capillary: 202 mg/dL — ABNORMAL HIGH (ref 70–99)
Glucose-Capillary: 209 mg/dL — ABNORMAL HIGH (ref 70–99)
Glucose-Capillary: 217 mg/dL — ABNORMAL HIGH (ref 70–99)
Glucose-Capillary: 222 mg/dL — ABNORMAL HIGH (ref 70–99)
Glucose-Capillary: 469 mg/dL — ABNORMAL HIGH (ref 70–99)

## 2022-02-25 LAB — CBC WITH DIFFERENTIAL/PLATELET
Abs Immature Granulocytes: 0.15 10*3/uL — ABNORMAL HIGH (ref 0.00–0.07)
Basophils Absolute: 0.1 10*3/uL (ref 0.0–0.1)
Basophils Relative: 0 %
Eosinophils Absolute: 0.4 10*3/uL (ref 0.0–0.5)
Eosinophils Relative: 2 %
HCT: 34.6 % — ABNORMAL LOW (ref 39.0–52.0)
Hemoglobin: 9.6 g/dL — ABNORMAL LOW (ref 13.0–17.0)
Immature Granulocytes: 1 %
Lymphocytes Relative: 11 %
Lymphs Abs: 1.8 10*3/uL (ref 0.7–4.0)
MCH: 26.2 pg (ref 26.0–34.0)
MCHC: 27.7 g/dL — ABNORMAL LOW (ref 30.0–36.0)
MCV: 94.5 fL (ref 80.0–100.0)
Monocytes Absolute: 0.7 10*3/uL (ref 0.1–1.0)
Monocytes Relative: 4 %
Neutro Abs: 13.2 10*3/uL — ABNORMAL HIGH (ref 1.7–7.7)
Neutrophils Relative %: 82 %
Platelets: 227 10*3/uL (ref 150–400)
RBC: 3.66 MIL/uL — ABNORMAL LOW (ref 4.22–5.81)
RDW: 15.4 % (ref 11.5–15.5)
WBC: 16.2 10*3/uL — ABNORMAL HIGH (ref 4.0–10.5)
nRBC: 0.6 % — ABNORMAL HIGH (ref 0.0–0.2)

## 2022-02-25 LAB — BASIC METABOLIC PANEL
Anion gap: 5 (ref 5–15)
Anion gap: 7 (ref 5–15)
Anion gap: 4 — ABNORMAL LOW (ref 5–15)
BUN: 96 mg/dL — ABNORMAL HIGH (ref 6–20)
BUN: 89 mg/dL — ABNORMAL HIGH (ref 6–20)
BUN: 91 mg/dL — ABNORMAL HIGH (ref 6–20)
CO2: 27 mmol/L (ref 22–32)
CO2: 24 mmol/L (ref 22–32)
CO2: 27 mmol/L (ref 22–32)
Calcium: 8.1 mg/dL — ABNORMAL LOW (ref 8.9–10.3)
Calcium: 7.1 mg/dL — ABNORMAL LOW (ref 8.9–10.3)
Calcium: 7.6 mg/dL — ABNORMAL LOW (ref 8.9–10.3)
Chloride: 118 mmol/L — ABNORMAL HIGH (ref 98–111)
Chloride: 109 mmol/L (ref 98–111)
Chloride: 121 mmol/L — ABNORMAL HIGH (ref 98–111)
Creatinine, Ser: 1.72 mg/dL — ABNORMAL HIGH (ref 0.61–1.24)
Creatinine, Ser: 1.45 mg/dL — ABNORMAL HIGH (ref 0.61–1.24)
Creatinine, Ser: 1.53 mg/dL — ABNORMAL HIGH (ref 0.61–1.24)
GFR, Estimated: 50 mL/min — ABNORMAL LOW (ref >60)
GFR, Estimated: >60 mL/min (ref >60)
GFR, Estimated: 57 mL/min — ABNORMAL LOW (ref >60)
Glucose, Bld: 179 mg/dL — ABNORMAL HIGH (ref 70–99)
Glucose, Bld: 483 mg/dL — ABNORMAL HIGH (ref 70–99)
Glucose, Bld: 123 mg/dL — ABNORMAL HIGH (ref 70–99)
Potassium: 4.7 mmol/L (ref 3.5–5.1)
Potassium: 4 mmol/L (ref 3.5–5.1)
Potassium: 4.3 mmol/L (ref 3.5–5.1)
Sodium: 150 mmol/L — ABNORMAL HIGH (ref 135–145)
Sodium: 140 mmol/L (ref 135–145)
Sodium: 152 mmol/L — ABNORMAL HIGH (ref 135–145)

## 2022-02-25 LAB — C-REACTIVE PROTEIN: CRP: 12.4 mg/dL — ABNORMAL HIGH (ref <1.0)

## 2022-02-25 LAB — PROTIME-INR
INR: 1.2 (ref 0.8–1.2)
Prothrombin Time: 14.7 seconds (ref 11.4–15.2)

## 2022-02-25 LAB — MAGNESIUM: Magnesium: 2.8 mg/dL — ABNORMAL HIGH (ref 1.7–2.4)

## 2022-02-25 LAB — IGG 4: IgG, Subclass 4: 70 mg/dL (ref 2–96)

## 2022-02-25 LAB — PHOSPHORUS: Phosphorus: 4.4 mg/dL (ref 2.5–4.6)

## 2022-02-25 SURGERY — CREATION, TRACHEOSTOMY
Anesthesia: General | Site: Throat

## 2022-02-25 MED ORDER — PROSOURCE TF PO LIQD
90.0000 mL | Freq: Three times a day (TID) | ORAL | Status: DC
  Administered 2022-02-25 – 2022-03-12 (×45): 90 mL
  Filled 2022-02-25 (×39): qty 90

## 2022-02-25 MED ORDER — LIDOCAINE-EPINEPHRINE 1 %-1:100000 IJ SOLN
INTRAMUSCULAR | Status: DC | PRN
  Administered 2022-02-25: 6 mL

## 2022-02-25 MED ORDER — FREE WATER
100.0000 mL | Status: DC
  Administered 2022-02-25 – 2022-02-26 (×4): 100 mL

## 2022-02-25 MED ORDER — VITAL 1.5 CAL PO LIQD
1000.0000 mL | ORAL | Status: DC
  Administered 2022-02-25 – 2022-03-12 (×12): 1000 mL

## 2022-02-25 MED ORDER — LIDOCAINE-EPINEPHRINE 1 %-1:100000 IJ SOLN
INTRAMUSCULAR | Status: AC
  Filled 2022-02-25: qty 1

## 2022-02-25 MED ORDER — FUROSEMIDE 10 MG/ML IJ SOLN
40.0000 mg | Freq: Once | INTRAMUSCULAR | Status: AC
  Administered 2022-02-25: 40 mg via INTRAVENOUS
  Filled 2022-02-25: qty 4

## 2022-02-25 MED ORDER — ROCURONIUM BROMIDE 100 MG/10ML IV SOLN
INTRAVENOUS | Status: DC | PRN
  Administered 2022-02-25 (×2): 50 mg via INTRAVENOUS

## 2022-02-25 SURGICAL SUPPLY — 33 items
BLADE SURG 15 STRL LF DISP TIS (BLADE) ×1 IMPLANT
BLADE SURG 15 STRL SS (BLADE) ×2
BLADE SURG SZ11 CARB STEEL (BLADE) ×2 IMPLANT
DRAPE MAG INST 16X20 L/F (DRAPES) ×2 IMPLANT
ELECT CAUTERY BLADE TIP 2.5 (TIP) ×2
ELECT REM PT RETURN 9FT ADLT (ELECTROSURGICAL) ×2
ELECTRODE CAUTERY BLDE TIP 2.5 (TIP) ×1 IMPLANT
ELECTRODE REM PT RTRN 9FT ADLT (ELECTROSURGICAL) ×1 IMPLANT
GAUZE 4X4 16PLY ~~LOC~~+RFID DBL (SPONGE) ×2 IMPLANT
GLOVE BIO SURGEON STRL SZ7.5 (GLOVE) ×2 IMPLANT
GOWN STRL REUS W/ TWL LRG LVL3 (GOWN DISPOSABLE) ×2 IMPLANT
GOWN STRL REUS W/TWL LRG LVL3 (GOWN DISPOSABLE) ×4
HEMOSTAT SURGICEL 2X3 (HEMOSTASIS) IMPLANT
HLDR TRACH TUBE NECKBAND 18 (MISCELLANEOUS) ×1 IMPLANT
HOLDER TRACH TUBE NECKBAND 18 (MISCELLANEOUS) ×2
KIT TURNOVER KIT A (KITS) ×2 IMPLANT
LABEL OR SOLS (LABEL) ×2 IMPLANT
MANIFOLD NEPTUNE II (INSTRUMENTS) ×2 IMPLANT
NS IRRIG 500ML POUR BTL (IV SOLUTION) ×2 IMPLANT
PACK HEAD/NECK (MISCELLANEOUS) ×2 IMPLANT
SHEARS HARMONIC 9CM CVD (BLADE) ×2 IMPLANT
SPONGE DRAIN TRACH 4X4 STRL 2S (GAUZE/BANDAGES/DRESSINGS) ×2 IMPLANT
SPONGE KITTNER 5P (MISCELLANEOUS) ×2 IMPLANT
SUCTION FRAZIER HANDLE 10FR (MISCELLANEOUS)
SUCTION TUBE FRAZIER 10FR DISP (MISCELLANEOUS) IMPLANT
SUT ETHILON 2 0 FS 18 (SUTURE) ×2 IMPLANT
SUT SILK 2 0 (SUTURE)
SUT SILK 2 0 SH (SUTURE) ×1 IMPLANT
SUT SILK 2-0 18XBRD TIE 12 (SUTURE) ×1 IMPLANT
SUT VIC AB 3-0 PS2 18 (SUTURE) ×1 IMPLANT
SYR 10ML LL (SYRINGE) ×2 IMPLANT
TUBE TRACH 8.0 EXL PROX  CUF (TUBING) ×2
TUBE TRACH 8.0 EXL PROX CUF (TUBING) IMPLANT

## 2022-02-25 NOTE — Progress Notes (Signed)
[place], [place] 02/25/22  Subjective:   Hospital day # 14  [name] is a 46 y.o.  male with past medical history of diabetes, hypertension, sCHF, OSA, and obesity, who was admitted to [facility] on 02/11/2022 for Acute respiratory failure ([place]) [J96.00] Respiratory failure ([place]) [J96.90] Peripheral edema [R60.9] Acute respiratory failure with hypoxia and hypercapnia ([place]) [J96.01, J96.02]  We have been re-consulted for elevated renal function. We were consulted during the initial admission for acute kidney injury which was corrected with IV Furosemide. Creatinine began to increase on 02/18/22.   Patient seen and evaluated at bedside in ICU Daughter at bedside Remains sedated on Fentanyl and versed Vent- slightly increased 48 % FiO2  Heparin drip for PE protocol Tube feeds- 34m/hr Foley and flexiseal placed, good urine output No lower extremity edema  Renal: 08/07 0701 - 08/08 0700 In: 5877.4 [I.V.:4977.4; NG/GT:900] Out: 4375 [Urine:4275; Stool:100] Lab Results  Component Value Date   CREATININE 1.72 (H) 02/25/2022   CREATININE 1.76 (H) 02/24/2022   CREATININE 1.85 (H) 02/24/2022     Objective:  Vital signs in last 24 hours:  Temp:  [97.7 F (36.5 C)-99.5 F (37.5 C)] 98.4 F (36.9 C) (08/08 0800) Pulse Rate:  [60-102] 88 (08/08 0800) Resp:  [18-43] 18 (08/08 0800) BP: (113-129)/(58-73) 113/73 (08/08 0800) SpO2:  [81 %-100 %] 99 % (08/08 0800) Arterial Line BP: (76-120)/(51-114) 112/64 (08/08 0800) FiO2 (%):  [35 %-70 %] 48 % (08/08 0757)  Weight change:  Filed Weights   02/22/22 0200 02/23/22 0500 02/24/22 0500  Weight: (!) 205 kg (!) 205.4 kg (!) 205.9 kg    Intake/Output:    Intake/Output Summary (Last 24 hours) at 02/25/2022 1105 Last data filed at 02/25/2022 1000 Gross per 24 hour  Intake 3992.17 ml  Output 3850 ml  Net 142.17 ml      Physical Exam: General: Critically ill-appearing, laying in the bed  HEENT ET  tube, OG tube in place, angioedema  Pulm/lungs Ventilator assisted  CVS/Heart Regular  Abdomen:  Soft, nontender, nondistended  Extremities: Trace dependent edema  Neurologic: Sedated  Skin: Warm, dry  Access:        Basic Metabolic Panel:  Recent Labs  Lab 02/21/22 0354 02/22/22 0424 02/22/22 0919 02/22/22 1450 02/23/22 0317 02/23/22 2004 02/24/22 0313 02/24/22 1709 02/24/22 2202 02/25/22 0309  NA 156*   < > 159*   < > 159* 157* 155* 153* 153* 150*  K 5.6*   < >  --   --  4.9  --  5.8* 5.5* 4.8 4.7  CL 122*   < >  --   --  125*  --  122* 118* 119* 118*  CO2 27   < >  --   --  28  --  _0 GLUCOSE 273*   < >  --   --  175*  --  300* 338* 153* 179*  BUN 143*   < >  --   --  129*  --  117* 107* 74* 96*  CREATININE 3.52*   < >  --   --  2.27*  --  1.87* 1.85* 1.76* 1.72*  CALCIUM 8.6*   < >  --   --  8.3*  --  8.2* 8.2* 8.3* 8.1*  MG 3.2*  --  2.9*  --  3.0*  --  3.4*  --   --  2.8*  PHOS 5.3*  --  4.1  --  4.6  --  4.8*  --   --  4.4   < > = values in this interval not displayed.      CBC: Recent Labs  Lab 02/21/22 0354 02/22/22 0424 02/23/22 0317 02/24/22 0313 02/25/22 0309  WBC 16.2* 15.3* 16.0* 15.6* 16.2*  NEUTROABS 13.7* 12.3* 13.3* 13.7* 13.2*  HGB 9.7* 9.4* 9.4* 9.4* 9.6*  HCT 35.3* 34.0* 33.8* 34.1* 34.6*  MCV 95.9 94.4 95.8 94.5 94.5  PLT 192 194 180 193 227      No results found for: "HEPBSAG", "HEPBSAB", "HEPBIGM"    Microbiology:  Recent Results (from the past 240 hour(s))  Culture, Respiratory w Gram Stain     Status: None   Collection Time: 02/16/22  4:27 PM   Specimen: Tracheal Aspirate; Respiratory  Result Value Ref Range Status   Specimen Description   Final    TRACHEAL ASPIRATE Performed at [facility], [address] [place], [place] [address]    Special Requests   Final    NONE Performed at [facility], [address] [place], [place] [address]    Gram Stain   Final    RARE WBC PRESENT,BOTH  PMN AND MONONUCLEAR NO ORGANISMS SEEN    Culture   Final    NO GROWTH Performed at [facility], [place] [address], [place], [place] [address]    Report Status 02/20/2022 FINAL  Final  Culture, Respiratory w Gram Stain     Status: None   Collection Time: 02/21/22 11:41 AM   Specimen: Bronchoalveolar Lavage; Respiratory  Result Value Ref Range Status   Specimen Description   Final    BRONCHIAL ALVEOLAR LAVAGE Performed at [facility], [address] [place], [place] [address]    Special Requests   Final    NONE Performed at [facility], [address] [place], [place] [address]    Gram Stain   Final    FEW WBC PRESENT,BOTH PMN AND MONONUCLEAR NO ORGANISMS SEEN    Culture   Final    NO GROWTH 2 DAYS Performed at [facility], [place] [address], [place], [place] [address]    Report Status 02/23/2022 FINAL  Final    Coagulation Studies: Recent Labs    02/25/22 0309  LABPROT 14.7  INR 1.2    Urinalysis: No results for input(s): "COLORURINE", "LABSPEC", "PHURINE", "GLUCOSEU", "HGBUR", "BILIRUBINUR", "KETONESUR", "PROTEINUR", "UROBILINOGEN", "NITRITE", "LEUKOCYTESUR" in the last 72 hours.  Invalid input(s): "APPERANCEUR"    Imaging: DG Chest Port 1 View  Result Date: 02/23/2022 CLINICAL DATA:  [id number]; intubation EXAM: PORTABLE CHEST 1 VIEW COMPARISON:  Radiograph dated August 2,4, 2023 FINDINGS: Evaluation is limited by underpenetration, patient rotation and extensive grid line artifact. The cardiomediastinal silhouette is unchanged and enlarged in contour.ETT tip terminates 6.5 cm above the carina. Enteric tube courses past the carina; tip is not well visualized secondary to poor penetration. It is favored to be below the diaphragm. RIGHT upper extremity PICC tip is estimated to terminate over the superior cavoatrial junction. No large pleural effusion. No large pneumothorax. Diffuse bilateral airspace opacities, grossly similar comparison to  prior given suboptimal technique. These are increased in the RIGHT lung in comparison to August 2nd with a persistent LEFT retrocardiac opacity in the LEFT lung and mildly improved aeration of the LEFT upper lung compared to August 2nd. IMPRESSION: Limited examination. Support apparatus as described above. Diffuse bilateral airspace opacities, favored to be similar comparison to most recent prior but overall  worsened in the RIGHT lung since August 2nd. Electronically Signed   By: Valentino Saxon M.D.   On: 02/23/2022 12:37     Medications:    sodium chloride 250 mL (02/19/22 1011)   sodium chloride Stopped (02/11/22 2011)   dextrose 100 mL/hr at 02/25/22 1000   feeding supplement (VITAL 1.5 CAL) Stopped (02/25/22 0000)   fentaNYL infusion INTRAVENOUS 200 mcg/hr (02/25/22 1000)   insulin 4.2 Units/hr (02/25/22 1011)   midazolam 6 mg/hr (02/25/22 1000)    artificial tears  1 Application Both Eyes A9V   Chlorhexidine Gluconate Cloth  6 each Topical Q0600   feeding supplement (PROSource TF)  90 mL Per Tube 5 X Daily   free water  250 mL Per Tube Q2H   insulin starter kit- pen needles  1 kit Other Once   ipratropium-albuterol  3 mL Nebulization Q6H   living well with diabetes book   Does not apply Once   multivitamin  1 tablet Per Tube QHS   mouth rinse  15 mL Mouth Rinse Q2H   oxyCODONE  10 mg Per Tube Q6H   pantoprazole sodium  40 mg Per Tube QHS   patiromer  16.8 g Oral Daily   phenobarbital  97.2 mg Per Tube TID   polyethylene glycol  17 g Per Tube Daily   senna-docusate  1 tablet Per Tube BID   sodium chloride flush  10-40 mL Intracatheter Q12H   sodium chloride flush  3 mL Intravenous Q12H   sodium chloride, acetaminophen **OR** acetaminophen, dextrose, docusate sodium, fentaNYL, midazolam, ondansetron **OR** ondansetron (ZOFRAN) IV, mouth rinse, polyethylene glycol, sodium chloride flush, sodium chloride flush, traZODone, vecuronium  Assessment/ Plan:  46 y.o. male with medical  problems of poorly controlled diabetes, hypertension, systolic CHF, obstructive sleep apnea, obesity    admitted on 02/11/2022 for Acute respiratory failure (HCC) [J96.00] Respiratory failure (HCC) [J96.90] Peripheral edema [R60.9] Acute respiratory failure with hypoxia and hypercapnia (HCC) [J96.01, J96.02]  #Acute kidney injury with hyperkalemia/hypernatremia.  Baseline creatinine 0.96 from February 11, 2022. Suspected secondary to fever, hypotension and sepsis. No IV contrast exposure or nephrotoxic agents. CK 663,   Potassium corrected to 4.7 with Veltassa. Continue D5W IV  100cc/hr and free water flushes with tube feeds.  Sodium slowly correcting, 150 today. Renal function continues to improve, with creatinine 1.72.  Urine output recorded 4.2 L in preceding 24 hours.   #Acute respiratory failure Ventilator assisted.  Management as per ICU team.    #Acute exacerbation of systolic and diastolic CHF 2D echo from February 11, 2022-LVEF 55 to 60%, moderate LVH, grade 1 diastolic dysfunction, enlarged right ventricle, moderately dilated right atrium, moderately dilated left atrium  #Poorly controlled diabetes type 2  Lab Results  Component Value Date   HGBA1C 10.1 (H) 02/11/2022  Well-controlled at this time.    LOS: [place] 8/8/202311:05 AM  [facility] [place], [place]

## 2022-02-25 NOTE — H&P (Signed)
History and physical reviewed and will be scanned in later. No significant change in medical status reported by the CCU staff, appears stable for surgery. All questions regarding the procedure answered, and  family  expressed understanding of the procedure.  Samuel Chavez @TODAY @

## 2022-02-25 NOTE — Progress Notes (Signed)
Nutrition Follow Up Note   DOCUMENTATION CODES:   Morbid obesity  INTERVENTION:   Increase to Vital 1.5 _0 /hr + ProSource TF 38m TID via tube  Free water flushes 1053mq4 hours   Regimen provides 2760kcal/day, 180g/day protein and 188465may of free water   NUTRITION DIAGNOSIS:   Inadequate oral intake related to inability to eat (pt sedated and ventilated) as evidenced by NPO status.  GOAL:   Provide needs based on ASPEN/SCCM guidelines -met   MONITOR:   Vent status, Labs, Weight trends, TF tolerance, Skin, I & O's  ASSESSMENT:   [age]o male with h/o COPD, CHF, HTN, DM and OSA who is admitted with CHF exacerbation, CAP and severe ARDS.  Pt remains sedated and ventilated. Plan is for tracheostomy today. NGT placed today and is in good position. Will plan to resume tube feeds after procedure today. Electrolytes back wnl. Pt remains on IVF with 250m86mter flushes; will decrease free water today as pt remains over 100lbs over his UBW. Pt +2.6L on his I & Os. Pt with edema and is third spacing fluid.   Medications reviewed and include: insulin, oxycodone, protonix, veltassa, miralax, 5% dextrose _1 /hr, senokot  Labs reviewed: Na 140 wnl, K 4.0 wnl, BUN 89(H), creat 1.45(H) P 4.4 wnl, Mg 2.8(H)- 8/8 Wbc- 16.2(H), Hgb 9.6(L), Hct 34.6(L) Cbgs- 164, 163, 469, 162, 139, 153 x 24 hrs  Patient is currently intubated on ventilator support MV: 15.3 L/min Temp (24hrs), Avg:98.6 F (37 C), Min:97.7 F (36.5 C), Max:99.5 F (37.5 C)  Propofol: none   MAP- >65mm74m UOP- 4275ml 35met Order:   Diet Order             Diet NPO time specified  Diet effective midnight                  EDUCATION NEEDS:   Not appropriate for education at this time  Skin:  Skin Assessment: Reviewed RN Assessment  Last BM:  8/8- type 7  Height:   Ht Readings from Last 1 Encounters:  02/13/22 5' 9.02" (1.753 m)    Weight:   Wt Readings from Last 1 Encounters:   02/24/22 (!) 205.9 kg    Ideal Body Weight:  72.7 kg  BMI:  Body mass index is 67 kg/m.  Estimated Nutritional Needs:   Kcal:  2700-3000kcal/day  Protein:  182g/day protein  Fluid:  2.0L/day  Chaos Carlile Koleen DistanceD, LDN Please refer to AM[facility] and/or RD on-call/weekend/after hours pager

## 2022-02-25 NOTE — Consult Note (Signed)
PHARMACY CONSULT NOTE  Pharmacy Consult for Electrolyte Monitoring and Replacement   Recent Labs: Potassium (mmol/L)  Date Value  02/25/2022 4.7   Magnesium (mg/dL)  Date Value  02/25/2022 2.8 (H)   Calcium (mg/dL)  Date Value  02/25/2022 8.1 (L)   Albumin (g/dL)  Date Value  02/11/2022 3.5   Phosphorus (mg/dL)  Date Value  02/25/2022 4.4   Sodium (mmol/L)  Date Value  02/25/2022 150 (H)   Assessment: Patient is a 46 y/o M with medical history including DM, HTN, pancreatitis, tobacco use disorder, systolic CHF, OSA, DM c/b diabetic neuropathy, lumbar radiculopathy, morbid obesity who is admitted with acute respiratory failure in setting of acute CHF and hypertensive urgency. Patient is currently intubated, sedated, and on mechanical ventilation in the ICU. Pharmacy consulted to assist with electrolyte monitoring and replacement as indicated.  Nutrition: Tube feeds at 45 mL/hr (~ 1 L/day) + free water increased to 250 mL q2h on 8/4 (3 L/day)   Diuretics: IV Lasix 40 mg BID (discontinued 8/1 for AKI) >> IV Lasix 40 mg x 1 trial on 8/7  MIVF: D5w at 100 cc/hr (2.4 L/day)  Misc: DDAVP 4 mcg given 8/5 and 8/6  Nephrology consulted for AKI, patient appears to be experiencing renal recovery  Goal of Therapy:  Electrolytes within normal limits  Plan:  --Na 150, slightly improved with increase in free water and DDAVP. Patient is net positive ~ 8L over last 3 days --K 4.7, Scr stable. Consider decreasing Veltassa to 8.4 daily tomorrow if potassium continues to down-trend --Re-check electrolytes with AM labs tomorrow  Benita Gutter  02/25/2022 7:45 AM

## 2022-02-25 NOTE — Progress Notes (Signed)
recruitment maneuver done with vent check.  Pressure Control 30, RR 10, Ti 3.0 sec, FiO2 100%, PEEP 14 X 2 minutes

## 2022-02-25 NOTE — Progress Notes (Signed)
FIO2 to .70 due to sats in 70's on .35 Suctioned pt & repeated recruitment maneuver with some improvement but still requires highter FIO2 than previously

## 2022-02-25 NOTE — Progress Notes (Signed)
Recruitment maneuver performed with ventilator check. PC 30, 10RR, 3s Ti, 100% FiO2, Peep 14 for 2 minutes. Patient tolerated well. 

## 2022-02-25 NOTE — Progress Notes (Signed)
Palliative: [name] is resting quietly in bed.  He is planned for tracheostomy today.  His daughter, Samuel Chavez, is present at bedside.  We talk about plan for the day.  Overall, daughter seems knowledgeable about the plan, asking appropriate questions. PMT to continue to follow.  Conference with bedside nursing staff and transition of care team related to patient condition and goals of care.  Plan:   Tracheostomy today.  Continue full scope/full code.  Time for outcomes.  25 minutes  Samuel Axe, NP Palliative medicine team Team phone [phone] Greater than 50% of this time was spent counseling and coordinating care related to the above assessment and plan.

## 2022-02-25 NOTE — Progress Notes (Signed)
  Progress Note   Date: 02/25/2022  Patient Name: Samuel Chavez        MRN#: [id number]  Review of the patient's clinical findings supports the diagnosis of  Renal failure due to cardiorenal syndrome

## 2022-02-25 NOTE — Transfer of Care (Signed)
Immediate Anesthesia Transfer of Care Note  Patient: Samuel Chavez  Procedure(s) Performed: TRACHEOSTOMY (Throat)  Patient Location: ICU  Anesthesia Type:General  Level of Consciousness: sedated  Airway & Oxygen Therapy: Patient remains intubated per anesthesia plan and Patient placed on Ventilator (see vital sign flow sheet for setting)  Post-op Assessment: Report given to RN and Post -op Vital signs reviewed and stable  Post vital signs: Reviewed and stable  Last Vitals:  Vitals Value Taken Time  BP    Temp    Pulse 95 02/25/22 1523  Resp 30 02/25/22 1523  SpO2 91 % 02/25/22 1523  Vitals shown include unvalidated device data.  Last Pain:  Vitals:   02/25/22 0800  TempSrc: Esophageal  PainSc: 0-No pain         Complications: No notable events documented.

## 2022-02-25 NOTE — Progress Notes (Signed)
NAME:  Samuel Chavez, MRN:  [id number], DOB:  [birth year], LOS: 14 ADMISSION DATE:  02/11/2022, CONSULTATION DATE: 02/11/22 REFERRING MD:  Valente David MD  CHIEF COMPLAINT: SOB   CC  follow up RESP FAILURE/acute CHF exacerbation  HPI/SYNOPSIS  46 y.o male with significant PMH as below who presented to the ED with chief complaints of SOB and worsening bilateral lower extremities edema.  ED Course: In the emergency department, the temperature was 37.3C, the heart rate 99 beats/minute, the blood pressure 187/102 mm Hg, the respiratory rate 20 breaths/minute, and the oxygen saturation 89% on. He was noted to be drowsy but still protecting his airway and responding appropriately.  Pertinent INITIAL Labs/Diagnostics Findings: Chemistry:Glucose:319 Calcium: 8.8 otherwise unremarkable CBC: unremarkable Other Lab findings: ABG with hypoxemia and hypercapnia, .  Imaging: Chest x-ray showed bilateral infiltrates worse on right    Significant Hospital Events   7/25: Admitted to hospitalist service w/acute on chronic hypoxic hypercapnic resp. failure requiring BiPAP.Failed BiPAP and intubated. PCCM consulted 7/26: INTUBATED, difficlut to sedate, started KETAMINE INFUSION 7/27: severe hypoxia, PEEP increased to 15 but decreased back to 10 7/28: Persistent severe hypoxia, ventilator changes made 7/29: Persistent hypoxia, recruitment maneuvers instituted, ventilator changes made, empiric heparin as patient cannot be scanned query PE -02/17/22- patient is severely critically ill with bilateral multifocal pneumonia on sedation and paralysis with mechanical ventilation maximal settings.  He is at cusp of death, we met with daughter today and discussed severity of illness. Unable to perform CT due to severity of critical illness. Met with previous PCCM doc and discussed medical plan.  Patient had recruitment maneuvers last few days without improvement, on heparin gtt for possible PE.   02/18/22- patient  continues to require maximal settings on ventilator despite good UOP.  He destaturated to <50% spO2 on MV today required BGV.   02/19/22- patient continues to require 100% FiO2 on maximal setting with high PEEP ladder for possible ARDS.  He was diuresed and on steroids with development of AKI.  His CXR had slight interval improvement on right. We discussed case with vascular surgery regarding possible empiric tPA for PE.  02/20/22- patient was unable to get CT today due to continued severe hypoxemia.  Daughter and other family members at bedside we reviewed case and medical plan.  There seems to be some confusion from family as they had asked me to wake patient up and take off ventilator even though I had explained multiple times that he is at very high risk for death.  They laughed at my comments and seemed to think it was not true. We may still be able to do bronchoscopy but its high risk.  We have reduced IV infusions by switching some medications to OGT route.  02/21/22- patient is weaned to 60%FiO2.  Plan for possible bronch if patient is tolerating.  Family at bedside.  If able to we will obtain more imaging and VQ scan to rule out PE.  02/22/22- Patient weaned to 50% on PRVC.  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 CBC stable , CMP with improved GFR. Monitoring Na, pharmacy and renal following for electrolytes/renal function.  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  Overall marked improvement on ventilator over past 48h. Met with daughter at bedside reviewed medical plan.  02/24/22-Vent requirements continue to slowly improve, currently 40% FiO2 and 14 PEEP.  AKI continues to slowly improve, remains  hyperkalemic with potassium of 5.8.  Placed back on Veltassa, Diurese x1.  Trach scheduled for tomorrow at 1:15 PM 02/25/22- Diurese with Lasix x1 dose. Trach scheduled for today.  Consults:   PCCM Nephrology Vascular Surgery Palliative Care ENT  Procedures:  7/25: Intubation 7/26: PICC triple-lumen, right basilic vein  Significant Diagnostic Tests:  7/25  Chest Xray:Chest x-ray showed cardiomegaly and pulmonary vascular congestion without overt pulmonary edema 7/25 Echocardiogram: difficult study, LVEF was estimated at 55 to 60%, dilated LV moderate LVH, grade 1 DD, enlargement of the right ventricle, left atrial size moderately dilated, right atrial size moderately dilated this is consistent with restrictive physiology (echo reading not congruous with prior echoes from [facility]) 7/29: Venous US BLE>>IMPRESSION: No lower extremity DVT. 7/30 Limited echo: LVEF 40 to 45% decreased LV function, no wall motion abnormalities, moderate dilation of the LV concentric LVH, right ventricular size moderately enlarged, right atrial enlargement, this is more consistent with prior [facility] scans 8/4: CT Head>>IMPRESSION: 1. No acute intracranial abnormality. 2. Paranasal sinusitis. 3. Bilateral complete opacification of the mastoid air cells and inner ears, as can be seen in the setting of otitis media. Correlate with symptoms. 8/4: CT Chest/Abdomen/Pelvis>>IMPRESSION: Extensive ground-glass, alveolar and patchy dense infiltrates in both lungs suggesting multifocal pneumonia. Part of this finding may suggest underlying pulmonary edema. Small bilateral pleural effusions are seen. Cardiomegaly. There is ectasia of the main pulmonary artery suggesting pulmonary arterial hypertension. There is no evidence of intestinal obstruction or pneumoperitoneum. There is no hydronephrosis. Appendix is not dilated. UB diverticula are seen in the colon without signs of focal diverticulitis. Severe degenerative changes are noted at L4-L5 level with significant interval worsening. Findings may be due to severe disc degeneration. If there is clinical suspicion for discitis  or osteomyelitis, follow-up MRI may be considered 8/4: Lung V/Q>>IMPRESSION: Pulmonary embolism absent.  Micro Data:  7/25: SARS-CoV-2 PCR> negative 7/25: MRSA PCR>> NEG 7/27: Strep pneumoniae Ag>> negative 7/27: Legionella Ag>> negative 7/27: Mycoplasma>> <770 7/27: Sputum>> Strep pneumo,Haemophilus influenzae 7/30 Sputum cult >> negative 8/4: Tracheal aspirate>>negative  Antimicrobials:  Cefepime 7/27 >>7/31 Ceftriaxone 7/31>>8/4 Vancomycin 7/27 >> 7/29, restarted 7/30 (resumed due to fever spike on Maxipime) ? Resistant Strep pneumo >> 7/31    REVIEW OF SYSTEMS Patient is unable to provide complete review of systems due to severe critical illness/ventilator dependence  OBJECTIVE  Blood pressure 116/71, pulse 87, temperature 97.7 F (36.5 C), resp. rate 18, height 5' 9.02" (1.753 m), weight (!) 205.9 kg, SpO2 97 %. CVP:  [11 mmHg-32 mmHg] 24 mmHg   Vent Mode: PRVC FiO2 (%):  [35 %-70 %] 70 % Set Rate:  [30 bmp] 30 bmp Vt Set:  [500 mL] 500 mL PEEP:  [14 cmH20] 14 cmH20 Plateau Pressure:  [28 cmH20-29 cmH20] 28 cmH20   Intake/Output Summary (Last 24 hours) at 02/25/2022 0734 Last data filed at 02/25/2022 0429 Gross per 24 hour  Intake 5877.44 ml  Output 4375 ml  Net 1502.44 ml    Filed Weights   02/22/22 0200 02/23/22 0500 02/24/22 0500  Weight: (!) 205 kg (!) 205.4 kg (!) 205.9 kg   PHYSICAL EXAMINATION: GENERAL: Acute on chronically ill appearing, Morbidly obese male, intubated, mechanically ventilated, sedated, in NAD HEAD: Normocephalic, atraumatic. EYES: Pupils equal, round, reactive to light.  No scleral icterus.  MOUTH: Macroglossia (massive).  Orotracheally intubated, OG in place. NECK: Supple. No thyromegaly. Trachea midline. Difficult to assess JVD due to body habitus.  No adenopathy. PULMONARY: coarse breath sounds bilaterally, even,  synchronous with vent. CARDIOVASCULAR: RRR, S1S2, no M/R/G ABDOMEN: Obese, soft, nondistended, tolerating tube  feeds. MUSCULOSKELETAL: No joint deformity, no clubbing, no edema.  SCDs in place. NEUROLOGIC: Heavily Sedated, withdraws from pain, pupils PERRL (sluggish 2 mm bilaterally) SKIN: Intact,warm,dry.  Chronic stasis changes in the lower extremities. PSYCH: Not assessed due to mechanically ventilated status.  Labs/imaging that I havepersonally reviewed    Labs   CBC: Recent Labs  Lab 02/21/22 0354 02/22/22 0424 02/23/22 0317 02/24/22 0313 02/25/22 0309  WBC 16.2* 15.3* 16.0* 15.6* 16.2*  NEUTROABS 13.7* 12.3* 13.3* 13.7* 13.2*  HGB 9.7* 9.4* 9.4* 9.4* 9.6*  HCT 35.3* 34.0* 33.8* 34.1* 34.6*  MCV 95.9 94.4 95.8 94.5 94.5  PLT 192 194 180 193 227     Basic Metabolic Panel: Recent Labs  Lab 02/21/22 0354 02/22/22 0424 02/22/22 0919 02/22/22 1450 02/23/22 0317 02/23/22 2004 02/24/22 0313 02/24/22 1709 02/24/22 2202 02/25/22 0309  NA 156*   < > 159*   < > 159* 157* 155* 153* 153* 150*  K 5.6*   < >  --   --  4.9  --  5.8* 5.5* 4.8 4.7  CL 122*   < >  --   --  125*  --  122* 118* 119* 118*  CO2 27   < >  --   --  28  --  $R'26 28 27 27  'mA$ GLUCOSE 273*   < >  --   --  175*  --  300* 338* 153* 179*  BUN 143*   < >  --   --  129*  --  117* 107* 74* 96*  CREATININE 3.52*   < >  --   --  2.27*  --  1.87* 1.85* 1.76* 1.72*  CALCIUM 8.6*   < >  --   --  8.3*  --  8.2* 8.2* 8.3* 8.1*  MG 3.2*  --  2.9*  --  3.0*  --  3.4*  --   --  2.8*  PHOS 5.3*  --  4.1  --  4.6  --  4.8*  --   --  4.4   < > = values in this interval not displayed.    GFR: Estimated Creatinine Clearance: 96.7 mL/min (A) (by C-G formula based on SCr of 1.72 mg/dL (H)). Recent Labs  Lab 02/19/22 0920 02/20/22 0359 02/21/22 0354 02/22/22 0424 02/23/22 0317 02/24/22 0313 02/25/22 0309  PROCALCITON 8.62 7.30  --   --   --   --   --   WBC  --  16.6*   < > 15.3* 16.0* 15.6* 16.2*   < > = values in this interval not displayed.     Liver Function Tests: No results for input(s): "AST", "ALT", "ALKPHOS",  "BILITOT", "PROT", "ALBUMIN" in the last 168 hours.  ABG    Component Value Date/Time   PHART 7.33 (L) 02/23/2022 2004   PCO2ART 59 (H) 02/23/2022 2004   PO2ART 53 (L) 02/23/2022 2004   HCO3 31.1 (H) 02/23/2022 2004   TCO2 26 03/17/2007 1822   O2SAT 82.6 02/23/2022 2004     Home Medications  Prior to Admission medications   Medication Sig Start Date End Date Taking? Authorizing Provider  losartan (COZAAR) 100 MG tablet Take 100 mg by mouth daily. 02/01/22  Yes [provider]  OZEMPIC, 0.25 OR 0.5 MG/DOSE, 2 MG/3ML SOPN SMARTSIG:0.25 Milligram(s) SUB-Q Once a Week 12/09/21  Yes [provider]  torsemide (DEMADEX) 20 MG tablet Take 20  mg by mouth daily. 12/18/21  Yes [provider]  Aspirin-Salicylamide-Caffeine (BC HEADACHE POWDER PO) Take 1 packet by mouth as needed (for pain).    [provider]  cephALEXin (KEFLEX) 500 MG capsule Take 1 capsule (500 mg total) by mouth 4 (four) times daily. Patient not taking: Reported on 02/11/2022 12/16/18   Davonna Belling, MD  cyclobenzaprine (FLEXERIL) 10 MG tablet Take 0.5-1 tablets (5-10 mg total) by mouth 2 (two) times daily as needed for muscle spasms. Patient not taking: Reported on 02/11/2022 03/12/18   Margarita Mail, PA-C  gabapentin (NEURONTIN) 600 MG tablet Take 600 mg by mouth at bedtime. Patient not taking: Reported on 02/11/2022 10/13/21   [provider]  LABETALOL HCL PO Take by mouth. Patient not taking: Reported on 02/11/2022    [provider]  meloxicam (MOBIC) 15 MG tablet Take 1 tablet (15 mg total) by mouth daily. Take 1 daily with food. Patient not taking: Reported on 02/11/2022 03/12/18   Margarita Mail, PA-C  metFORMIN (GLUCOPHAGE) 500 MG tablet Take 500 mg by mouth 2 (two) times daily with a meal. Patient not taking: Reported on 02/11/2022    [provider]  METFORMIN HCL ER, MOD, PO Take by mouth. Patient not taking: Reported on 02/11/2022    [provider]  METOPROLOL SUCCINATE ER PO Take by mouth. Patient not taking: Reported on 02/11/2022    [provider]  oxyCODONE-acetaminophen (PERCOCET) 10-325 MG tablet Take 1 tablet by mouth See admin instructions. Take 1 tablet by mouth 4-5 times a day as needed for pain Patient not taking: Reported on 02/11/2022    [provider]  oxyCODONE-acetaminophen (PERCOCET/ROXICET) 5-325 MG tablet Take 1 tablet by mouth every 8 (eight) hours as needed for severe pain. Patient not taking: Reported on 02/11/2022 12/16/18   Davonna Belling, MD  predniSONE (DELTASONE) 20 MG tablet Take 2 tablets (40 mg total) by mouth daily. Patient not taking: Reported on 02/11/2022 04/04/15   Davonna Belling, MD   Hospital Scheduled Meds:  artificial tears  1 Application Both Eyes Q2I   Chlorhexidine Gluconate Cloth  6 each Topical Q0600   feeding supplement (PROSource TF)  90 mL Per Tube 5 X Daily   free water  250 mL Per Tube Q2H   insulin starter kit- pen needles  1 kit Other Once   ipratropium-albuterol  3 mL Nebulization Q6H   living well with diabetes book   Does not apply Once   multivitamin  1 tablet Per Tube QHS   mouth rinse  15 mL Mouth Rinse Q2H   oxyCODONE  10 mg Per Tube Q6H   pantoprazole sodium  40 mg Per Tube QHS   patiromer  16.8 g Oral Daily   phenobarbital  97.2 mg Per Tube TID   polyethylene glycol  17 g Per Tube Daily   senna-docusate  1 tablet Per Tube BID   sodium chloride flush  10-40 mL Intracatheter Q12H   sodium chloride flush  3 mL Intravenous Q12H   Continuous Infusions:  sodium chloride 250 mL (02/19/22 1011)   sodium chloride Stopped (02/11/22 2011)   dextrose 100 mL/hr at 02/25/22 0345   feeding supplement (VITAL 1.5 CAL) Stopped (02/25/22 0000)   fentaNYL infusion INTRAVENOUS 200 mcg/hr (02/25/22 0622)   insulin 5.5 Units/hr (02/25/22 0622)   midazolam 6 mg/hr (02/25/22 0345)   PRN Meds:.sodium chloride, acetaminophen **OR** acetaminophen, dextrose,  docusate sodium, fentaNYL, midazolam, ondansetron **OR** ondansetron (ZOFRAN) IV, mouth rinse, polyethylene glycol, sodium  chloride flush, sodium chloride flush, traZODone, vecuronium      Active Hospital Problem list    Patient Active Problem List   Diagnosis Date Noted   Acute respiratory failure ([place]) 02/11/2022   Respiratory failure ([place]) 02/11/2022   Acute respiratory failure with hypoxia and hypercarbia ([facility]) 02/11/2022   Acute on chronic systolic CHF (congestive heart failure) ([place]) 02/11/2022   Hypertensive urgency 02/11/2022   Type 2 diabetes mellitus with peripheral neuropathy ([place]) 02/11/2022   OBSTRUCTIVE SLEEP APNEA 04/30/2009    Assessment & Plan:   46 yo morbidly obese AAM with severe decompensated acute systolic and diastolic heart failure with previously documented severe OSA/OHS, noncompliant with CPAP, active smoker, leading to severe hypoxic resp failure with encephalopathy and difficulty with sedation   Severe acute on chronic hypoxic and hypercapnic respiratory failure due to CAP & acute decompensation of chronic heart failure Persistent hypoxemia despite trial of several ventilator strategies -Full vent support, implement lung protective strategies -Plateau pressures less than 30 cm H20 -Wean FiO2 & PEEP as tolerated to maintain O2 sats >88% -Follow intermittent Chest X-ray & ABG as needed -Spontaneous Breathing Trials when respiratory parameters met and mental status permits -Implement VAP Bundle -Prn Bronchodilators -will NOT perform SAT/SBT due to high FiO2 requirements, asynchrony off sedation  -Recruitment maneuvers every 4h and as needed. -Patient cannot be proned, due to weight limit for safe proning and unstable airway (ET tube with constant need of repositioning) -s/p bronchoscopy with removal of ispissated mucus plugging bilaterally with interval improvement post procedure. -Diuresis as BP and renal function permits -Plan for Tracheostomy 8/8    Community-acquired pneumonia: Streptococcus pneumoniae and Haemophilus influenzae ~ TREATED -Monitor fever curve -Trend WBC's & Procalcitonin -Follow cultures as above -Completed course of ABX as above  Acute on chronic systolic and diastolic heart failure LVEF 40 to 45% consistent with prior studies performed at [facility] -Continuous cardiac monitoring -Maintain MAP >65 -Vasopressors as needed to maintain MAP goal ~ weaned off -Trend lactic acid until normalized -HS Troponin peaked at 24 -2D Echocardiogram shows diastolic dysfunction and enlarged left and right atria consistent with restrictive physiology -Diuresis as blood pressure and renal function permits ~ will give 40 mg IV Lasix x1 dose 8/8  ACUTE KIDNEY INJURY/Renal Failure -STAGE 4, Element of cardiorenal syndrome ~ IMPROVING Hypernatremia ~ 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 -Monitor I&O's / urinary output -Follow BMP -Ensure adequate renal perfusion -Avoid nephrotoxic agents as able -Replace electrolytes as indicated -Continue free water flushes and D5W infusion -Nephrology- on case, appreciate input      Intake/Output Summary (Last 24 hours) at 02/25/2022 0734 Last data filed at 02/25/2022 0429 Gross per 24 hour  Intake 5877.44 ml  Output 4375 ml  Net 1502.44 ml        Latest Ref Rng & Units 02/25/2022    3:09 AM 02/24/2022   10:02 PM 02/24/2022    5:09 PM  BMP  Glucose 70 - 99 mg/dL 179  153  338   BUN 6 - 20 mg/dL 96  74  107   Creatinine 0.61 - 1.24 mg/dL 1.72  1.76  1.85   Sodium 135 - 145 mmol/L 150  153  153   Potassium 3.5 - 5.1 mmol/L 4.7  4.8  5.5   Chloride 98 - 111 mmol/L 118  119  118   CO2 22 - 32 mmol/L 27  27  28  Calcium 8.9 - 10.3 mg/dL 8.1  8.3  8.2     Diabetes Mellitus -CBG's q4h; Target range of 140 to 180 -SSI -Follow ICU Hypo/Hyperglycemia protocol -HgbA1c  10.1 -Hold  Metformin  GI GI PROPHYLAXIS PPI  NUTRITIONAL STATUS DIET-->TF's as tolerated Constipation protocol as indicated  ACUTE ANEMIA- -Monitor for S/Sx of bleeding -Trend CBC -Heparin SQ for VTE Prophylaxis (on hold 8/8 for Trach) -Transfuse for Hgb <7     Diet: Tube feeds (on hold 8/8 for Trach) Pain/Anxiety/Delirium protocol (if indicated): Yes (RASS goal -1 to -2) VAP protocol (if indicated): Yes DVT prophylaxis: Heparin SQ GI prophylaxis: PPI Glucose control:  SSI Yes Central venous access:  PICC, and is still needed Arterial line:  yes, and is still needed Foley:  Yes, and it is still needed Mobility:  bed rest  PT consulted: N/A Last date of multidisciplinary goals of care discussion [8/8] Code Status:  full code Disposition: ICU  8/8: Pt's daughter updated at bedside.   IMAGING    -CT CHEST WITH MULTIFOCAL BILATERAL PNEUMONIA    Critical care provider statement:   Total critical care time: 40 minutes   Darel Hong, AGACNP-BC [facility] Prefer epic messenger for cross cover needs If after hours, please call E-link    Critical care was necessary to treat or prevent imminent or life-threatening deterioration.

## 2022-02-25 NOTE — Op Note (Signed)
02/25/2022  2:57 PM    Desmond Lope  [id number]   Pre-Op Diagnosis:  Acute respiratroy failure  Post-op Diagnosis: Acute respiratroy failure  Procedure: Elective Tracheostomy  Surgeon:  [name]  Assistant: none  Anesthesia:  General endotracheal anesthesia  EBL:  less than 20 cc  Complications:  None  Findings: None  Procedure: The patient was taken to the Operating Room from the CCU, already intubated, and placed in the supine position.  After induction of general anesthesia, the patient was placed on a shoulder roll with the neck extended. The skin was injected along the proposed incision line over the trachea with 1% lidocaine with epinephrine, 1:100,000. The area was then prepped and draped in the usual sterile fashion.  A 15 blade was then used to incise the skin in a horizontal incision over the trachea. The dissection was carried down to the subcutaneous tissues and through the platysma with the Bovie. Anterior jugular veins were divided with the harmonic scalpel for hemostasis. The strap muscles were divided in the midline and retracted laterally. The thyroid isthmus was exposed and divided in the midline over the trachea and dissected away from the anterior aspect of the trachea with the Bovie and the Harmonic Scalpel. With hemostasis obtained, the anesthesiologist was alerted that the airway was about to be entered so that the oxygen concentration could be lowered to reduce fire risk. The scrub tech prepared the tracheostomy tube, confirming no leak in the balloon cuff. The trachea was then incised between the 2nd and third tracheal rings, and an inferiorly based tracheal flap created by cutting through the third tracheal ring laterally. This was sutured up to the skin with a 3-0 silk suture to help create a tracheocutaneous tract. The airway was suctioned and, after the anesthesiologist pulled the endotracheal tube back,  a #8 proximal XLT Shiley tracheostomy tube  was inserted into the tracheal lumen. The inner cannula was placed, the cuff inflated,  and the patient hooked to the anesthesia circuit for ventilation. CO2 return and adequate ventilation was confirmed with the anesthesiologist. Hemostasis was confirmed and the flange of the tracheostomy tube was sutured to the skin with 3-0 nylon suture. A trach tie was placed around the neck to further secure the tracheostomy tube. Surgical was placed on either side of the wound.  Betadine soaked gauze was placed around the wound.  The patient's tongue was inspected as it was quite as it was quite edematous. There was no was no evidence of any abscess but there was an open wound where the lower teeth had lacerated the tongue.  The patient was then returned to the anesthesiologist and taken to the CCU in stable condition.  Disposition:   Return to the CCU  Plan: Routine trach care and suctioning each shift and PRN. Vent settings per CCU admitting physician. Sutures can be removed in a week.  [name] 02/25/2022 2:57 PM

## 2022-02-25 NOTE — Progress Notes (Signed)
Consent for trach is in the pt's chart, Tube feeds have been on hold since midnight. Spoke with NP about water flushes, per NP to continue 250 cc flushes q 2hr for hypernatremia intervention. D5 is still going at 100 as well as insulin gtt. Fentanyl and versed for sedation pt maintains a RASS of -4. No heparin was given this am. Pt did desaturate into the 70s around 0400, respiratory was at bedside and FiO2 was increased to 70%.

## 2022-02-25 NOTE — Progress Notes (Signed)
Recruitment maneuver performed without complication.

## 2022-02-26 ENCOUNTER — Encounter: Payer: Self-pay | Admitting: Otolaryngology

## 2022-02-26 ENCOUNTER — Inpatient Hospital Stay: Payer: Medicaid Other

## 2022-02-26 DIAGNOSIS — J9602 Acute respiratory failure with hypercapnia: Secondary | ICD-10-CM | POA: Diagnosis not present

## 2022-02-26 DIAGNOSIS — J9601 Acute respiratory failure with hypoxia: Secondary | ICD-10-CM | POA: Diagnosis not present

## 2022-02-26 LAB — CBC WITH DIFFERENTIAL/PLATELET
Abs Immature Granulocytes: 0.12 10*3/uL — ABNORMAL HIGH (ref 0.00–0.07)
Basophils Absolute: 0 10*3/uL (ref 0.0–0.1)
Basophils Relative: 0 %
Eosinophils Absolute: 0.3 10*3/uL (ref 0.0–0.5)
Eosinophils Relative: 2 %
HCT: 34 % — ABNORMAL LOW (ref 39.0–52.0)
Hemoglobin: 9.5 g/dL — ABNORMAL LOW (ref 13.0–17.0)
Immature Granulocytes: 1 %
Lymphocytes Relative: 9 %
Lymphs Abs: 1.1 10*3/uL (ref 0.7–4.0)
MCH: 26.8 pg (ref 26.0–34.0)
MCHC: 27.9 g/dL — ABNORMAL LOW (ref 30.0–36.0)
MCV: 96 fL (ref 80.0–100.0)
Monocytes Absolute: 0.5 10*3/uL (ref 0.1–1.0)
Monocytes Relative: 4 %
Neutro Abs: 10.4 10*3/uL — ABNORMAL HIGH (ref 1.7–7.7)
Neutrophils Relative %: 84 %
Platelets: 227 10*3/uL (ref 150–400)
RBC: 3.54 MIL/uL — ABNORMAL LOW (ref 4.22–5.81)
RDW: 15.5 % (ref 11.5–15.5)
WBC: 12.4 10*3/uL — ABNORMAL HIGH (ref 4.0–10.5)
nRBC: 0.3 % — ABNORMAL HIGH (ref 0.0–0.2)

## 2022-02-26 LAB — GLUCOSE, CAPILLARY
Glucose-Capillary: 138 mg/dL — ABNORMAL HIGH (ref 70–99)
Glucose-Capillary: 141 mg/dL — ABNORMAL HIGH (ref 70–99)
Glucose-Capillary: 155 mg/dL — ABNORMAL HIGH (ref 70–99)
Glucose-Capillary: 157 mg/dL — ABNORMAL HIGH (ref 70–99)
Glucose-Capillary: 159 mg/dL — ABNORMAL HIGH (ref 70–99)
Glucose-Capillary: 170 mg/dL — ABNORMAL HIGH (ref 70–99)
Glucose-Capillary: 171 mg/dL — ABNORMAL HIGH (ref 70–99)
Glucose-Capillary: 174 mg/dL — ABNORMAL HIGH (ref 70–99)
Glucose-Capillary: 175 mg/dL — ABNORMAL HIGH (ref 70–99)
Glucose-Capillary: 175 mg/dL — ABNORMAL HIGH (ref 70–99)
Glucose-Capillary: 179 mg/dL — ABNORMAL HIGH (ref 70–99)
Glucose-Capillary: 183 mg/dL — ABNORMAL HIGH (ref 70–99)
Glucose-Capillary: 187 mg/dL — ABNORMAL HIGH (ref 70–99)
Glucose-Capillary: 187 mg/dL — ABNORMAL HIGH (ref 70–99)
Glucose-Capillary: 192 mg/dL — ABNORMAL HIGH (ref 70–99)
Glucose-Capillary: 193 mg/dL — ABNORMAL HIGH (ref 70–99)
Glucose-Capillary: 193 mg/dL — ABNORMAL HIGH (ref 70–99)
Glucose-Capillary: 194 mg/dL — ABNORMAL HIGH (ref 70–99)
Glucose-Capillary: 198 mg/dL — ABNORMAL HIGH (ref 70–99)
Glucose-Capillary: 201 mg/dL — ABNORMAL HIGH (ref 70–99)
Glucose-Capillary: 206 mg/dL — ABNORMAL HIGH (ref 70–99)
Glucose-Capillary: 219 mg/dL — ABNORMAL HIGH (ref 70–99)

## 2022-02-26 LAB — BASIC METABOLIC PANEL
Anion gap: 4 — ABNORMAL LOW (ref 5–15)
BUN: 89 mg/dL — ABNORMAL HIGH (ref 6–20)
CO2: 29 mmol/L (ref 22–32)
Calcium: 8.1 mg/dL — ABNORMAL LOW (ref 8.9–10.3)
Chloride: 121 mmol/L — ABNORMAL HIGH (ref 98–111)
Creatinine, Ser: 1.61 mg/dL — ABNORMAL HIGH (ref 0.61–1.24)
GFR, Estimated: 54 mL/min — ABNORMAL LOW (ref >60)
Glucose, Bld: 209 mg/dL — ABNORMAL HIGH (ref 70–99)
Potassium: 5 mmol/L (ref 3.5–5.1)
Sodium: 154 mmol/L — ABNORMAL HIGH (ref 135–145)

## 2022-02-26 LAB — PHOSPHORUS: Phosphorus: 5.2 mg/dL — ABNORMAL HIGH (ref 2.5–4.6)

## 2022-02-26 LAB — MAGNESIUM: Magnesium: 2.8 mg/dL — ABNORMAL HIGH (ref 1.7–2.4)

## 2022-02-26 LAB — C-REACTIVE PROTEIN: CRP: 16.5 mg/dL — ABNORMAL HIGH (ref <1.0)

## 2022-02-26 MED ORDER — PANCRELIPASE (LIP-PROT-AMYL) 10440-39150 UNITS PO TABS
20880.0000 [IU] | ORAL_TABLET | Freq: Once | ORAL | Status: AC
Start: 2022-02-26 — End: 2022-02-26
  Administered 2022-02-26: 20880 [IU]
  Filled 2022-02-26: qty 2

## 2022-02-26 MED ORDER — ALBUMIN HUMAN 25 % IV SOLN
12.5000 g | Freq: Two times a day (BID) | INTRAVENOUS | Status: AC
  Administered 2022-02-26 (×2): 12.5 g via INTRAVENOUS
  Filled 2022-02-26 (×2): qty 50

## 2022-02-26 MED ORDER — SODIUM BICARBONATE 650 MG PO TABS
650.0000 mg | ORAL_TABLET | Freq: Once | ORAL | Status: AC
  Administered 2022-02-26: 650 mg
  Filled 2022-02-26: qty 1

## 2022-02-26 MED ORDER — CLONAZEPAM 0.5 MG PO TBDP
2.0000 mg | ORAL_TABLET | Freq: Two times a day (BID) | ORAL | Status: DC
  Administered 2022-02-26 – 2022-02-28 (×5): 2 mg
  Filled 2022-02-26 (×5): qty 4

## 2022-02-26 MED ORDER — FREE WATER
200.0000 mL | Status: DC
  Administered 2022-02-26 – 2022-03-03 (×31): 200 mL

## 2022-02-26 MED ORDER — HEPARIN SODIUM (PORCINE) 5000 UNIT/ML IJ SOLN
5000.0000 [IU] | Freq: Three times a day (TID) | INTRAMUSCULAR | Status: DC
  Administered 2022-02-26 – 2022-03-05 (×19): 5000 [IU] via SUBCUTANEOUS
  Filled 2022-02-26 (×19): qty 1

## 2022-02-26 NOTE — Anesthesia Postprocedure Evaluation (Signed)
Anesthesia Post Note  Patient: Samuel Chavez  Procedure(s) Performed: TRACHEOSTOMY (Throat)  Patient location during evaluation: SICU Anesthesia Type: General Level of consciousness: sedated Pain management: pain level controlled Vital Signs Assessment: post-procedure vital signs reviewed and stable Respiratory status: patient remains intubated per anesthesia plan Cardiovascular status: stable (see flowsheet for drips) Postop Assessment: no apparent nausea or vomiting Anesthetic complications: no   No notable events documented.   Last Vitals:  Vitals:   02/26/22 0542 02/26/22 0600  BP:  110/60  Pulse: (!) 101 (!) 101  Resp: (!) 28 (!) 30  Temp: (!) 38.1 C   SpO2: 98% 97%    Last Pain:  Vitals:   02/26/22 0542  TempSrc: Oral  PainSc:                  Samuel Chavez

## 2022-02-26 NOTE — Progress Notes (Signed)
Recruitment Maneuver completed  PC 30, 10RR, 3s Ti, 100% FiO2, Peep 14 for 2 minutes

## 2022-02-26 NOTE — Progress Notes (Signed)
Recruitment Maneuver completed   PC 30, 10RR, 3s Ti, 100% FiO2, Peep 14 for 2 minutes

## 2022-02-26 NOTE — Progress Notes (Signed)
NAME:  Samuel Chavez, MRN:  [id number], DOB:  06-10-1976, LOS: 22 ADMISSION DATE:  02/11/2022, CONSULTATION DATE: 02/11/22 REFERRING MD:  Eugenie Norrie MD  CHIEF COMPLAINT: SOB   CC  follow up RESP FAILURE/acute CHF exacerbation  HPI/SYNOPSIS  46 y.o male with significant PMH as below who presented to the ED with chief complaints of SOB and worsening bilateral lower extremities edema.  ED Course: In the emergency department, the temperature was 37.3C, the heart rate 99 beats/minute, the blood pressure 187/102 mm Hg, the respiratory rate 20 breaths/minute, and the oxygen saturation 89% on. He was noted to be drowsy but still protecting his airway and responding appropriately.  Pertinent INITIAL Labs/Diagnostics Findings: Chemistry:Glucose:319 Calcium: 8.8 otherwise unremarkable CBC: unremarkable Other Lab findings: ABG with hypoxemia and hypercapnia, .  Imaging: Chest x-ray showed bilateral infiltrates worse on right    Significant Hospital Events   7/25: Admitted to hospitalist service w/acute on chronic hypoxic hypercapnic resp. failure requiring BiPAP.Failed BiPAP and intubated. PCCM consulted 7/26: INTUBATED, difficlut to sedate, started [place] 7/27: severe hypoxia, PEEP increased to 15 but decreased back to 10 7/28: Persistent severe hypoxia, ventilator changes made 7/29: Persistent hypoxia, recruitment maneuvers instituted, ventilator changes made, empiric heparin as patient cannot be scanned query PE -02/17/22- patient is severely critically ill with bilateral multifocal pneumonia on sedation and paralysis with mechanical ventilation maximal settings.  He is at cusp of death, we met with daughter today and discussed severity of illness. Unable to perform CT due to severity of critical illness. Met with previous PCCM doc and discussed medical plan.  Patient had recruitment maneuvers last few days without improvement, on heparin gtt for possible PE.   02/18/22- patient  continues to require maximal settings on ventilator despite good UOP.  He destaturated to <50% spO2 on MV today required BGV.   02/19/22- patient continues to require 100% FiO2 on maximal setting with high PEEP ladder for possible ARDS.  He was diuresed and on steroids with development of AKI.  His CXR had slight interval improvement on right. We discussed case with vascular surgery regarding possible empiric tPA for PE.  02/20/22- patient was unable to get CT today due to continued severe hypoxemia.  Daughter and other family members at bedside we reviewed case and medical plan.  There seems to be some confusion from family as they had asked me to wake patient up and take off ventilator even though I had explained multiple times that he is at very high risk for death.  They laughed at my comments and seemed to think it was not true. We may still be able to do bronchoscopy but its high risk.  We have reduced IV infusions by switching some medications to OGT route.  02/21/22- patient is weaned to 60%FiO2.  Plan for possible bronch if patient is tolerating.  Family at bedside.  If able to we will obtain more imaging and VQ scan to rule out PE.  02/22/22- Patient weaned to 50% on PRVC.  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 CBC stable , CMP with improved GFR. Monitoring Na, pharmacy and renal following for electrolytes/renal function.  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  Overall marked improvement on ventilator over past 48h. Met with daughter at bedside reviewed medical plan.  02/24/22-Vent requirements continue to slowly improve, currently 40% FiO2 and 14 PEEP.  AKI continues to slowly improve, remains  hyperkalemic with potassium of 5.8.  Placed back on Veltassa, Diurese x1.  Trach scheduled for tomorrow at 1:15 PM 02/25/22- Diurese with Lasix x1 dose. Trach scheduled for today. 02/26/22: Remains mechanically  ventilated via newly placed tracheostomy will attempt to wean ventilator settings today.  Perform WUA   Consults:  PCCM Nephrology Vascular Surgery Palliative Care ENT  Procedures:  7/25: Intubation 7/26: PICC triple-lumen, right basilic vein 4/49: Left radial arterial line   Significant Diagnostic Tests:  7/25  Chest Xray:Chest x-ray showed cardiomegaly and pulmonary vascular congestion without overt pulmonary edema 7/25 Echocardiogram: difficult study, LVEF was estimated at 55 to 60%, dilated LV moderate LVH, grade 1 DD, enlargement of the right ventricle, left atrial size moderately dilated, right atrial size moderately dilated this is consistent with restrictive physiology (echo reading not congruous with prior echoes from [facility]) 7/29: Venous US BLE>>IMPRESSION: No lower extremity DVT. 7/30 Limited echo: LVEF 40 to 45% decreased LV function, no wall motion abnormalities, moderate dilation of the LV concentric LVH, right ventricular size moderately enlarged, right atrial enlargement, this is more consistent with prior [facility] scans 8/4: CT Head>>IMPRESSION: 1. No acute intracranial abnormality. 2. Paranasal sinusitis. 3. Bilateral complete opacification of the mastoid air cells and inner ears, as can be seen in the setting of otitis media. Correlate with symptoms. 8/4: CT Chest/Abdomen/Pelvis>>IMPRESSION: Extensive ground-glass, alveolar and patchy dense infiltrates in both lungs suggesting multifocal pneumonia. Part of this finding may suggest underlying pulmonary edema. Small bilateral pleural effusions are seen. Cardiomegaly. There is ectasia of the main pulmonary artery suggesting pulmonary arterial hypertension. There is no evidence of intestinal obstruction or pneumoperitoneum. There is no hydronephrosis. Appendix is not dilated. UB diverticula are seen in the colon without signs of focal diverticulitis. Severe degenerative changes are noted at  L4-L5 level with significant interval worsening. Findings may be due to severe disc degeneration. If there is clinical suspicion for discitis or osteomyelitis, follow-up MRI may be considered 8/4: Lung V/Q>>IMPRESSION: Pulmonary embolism absent.  Micro Data:  7/25: SARS-CoV-2 PCR> negative 7/25: MRSA PCR>> NEG 7/27: Strep pneumoniae Ag>> negative 7/27: Legionella Ag>> negative 7/27: Mycoplasma>> <770 7/27: Sputum>> Strep pneumo,Haemophilus influenzae 7/30 Sputum cult >> negative 8/4: Tracheal aspirate>>negative  Antimicrobials:  Cefepime 7/27 >>7/31 Ceftriaxone 7/31>>8/4 Vancomycin 7/27 >> 7/29, restarted 7/30 (resumed due to fever spike on Maxipime) ? Resistant Strep pneumo >> 7/31  REVIEW OF SYSTEMS Patient is unable to provide complete review of systems due to severe critical illness/ventilator dependence  OBJECTIVE  Blood pressure 110/60, pulse (!) 101, temperature (!) 100.5 F (38.1 C), temperature source Oral, resp. rate (!) 30, height 5' 9.02" (1.753 m), weight (!) 199 kg, SpO2 97 %. CVP:  [18 mmHg-30 mmHg] 23 mmHg   Vent Mode: PRVC FiO2 (%):  [45 %-100 %] 100 % Set Rate:  [30 bmp] 30 bmp Vt Set:  [500 mL] 500 mL PEEP:  [14 cmH20] 14 cmH20 Plateau Pressure:  [27 cmH20-30 cmH20] 28 cmH20   Intake/Output Summary (Last 24 hours) at 02/26/2022 0721 Last data filed at 02/26/2022 0541 Gross per 24 hour  Intake 3810.16 ml  Output 5175 ml  Net -1364.84 ml   Filed Weights   02/23/22 0500 02/24/22 0500 02/26/22 0500  Weight: (!) 205.4 kg (!) 205.9 kg (!) 199 kg   PHYSICAL EXAMINATION: GENERAL: Acute on chronically ill appearing, Morbidly obese male, intubated, mechanically ventilated, sedated, in NAD HEAD: Normocephalic, atraumatic. EYES: Pupils equal, round, reactive to light.  No scleral icterus.  MOUTH: Macroglossia (massive).  Orotracheally intubated, NG in place. NECK: Supple. No thyromegaly. Trachea midline. Difficult to assess JVD due to body habitus. PULMONARY:  faint rhonchi throughout, even, non labored  CARDIOVASCULAR: NSR, rrr, no M/R/G, 2+ radial/2+ distal pulses, trace generalized edema  ABDOMEN: Obese, soft, nondistended, tolerating tube feeds. MUSCULOSKELETAL: Normal bulk and tone NEUROLOGIC: Sedated, withdraws from pain, pupils PERRL (sluggish 2 mm bilaterally) SKIN: Intact,warm,dry.  Chronic stasis changes in the lower extremities. PSYCH: Not assessed due to mechanically ventilated status.  Labs/imaging that I havepersonally reviewed    Labs   CBC: Recent Labs  Lab 02/22/22 0424 02/23/22 0317 02/24/22 0313 02/25/22 0309 02/26/22 0502  WBC 15.3* 16.0* 15.6* 16.2* 12.4*  NEUTROABS 12.3* 13.3* 13.7* 13.2* 10.4*  HGB 9.4* 9.4* 9.4* 9.6* 9.5*  HCT 34.0* 33.8* 34.1* 34.6* 34.0*  MCV 94.4 95.8 94.5 94.5 96.0  PLT 194 180 193 227 161    Basic Metabolic Panel: Recent Labs  Lab 02/22/22 0919 02/22/22 1450 02/23/22 0317 02/23/22 2004 02/24/22 0313 02/24/22 1709 02/24/22 2202 02/25/22 0309 02/25/22 1107 02/25/22 1542 02/26/22 0502  NA 159*   < > 159*   < > 155*   < > 153* 150* 140 152* 154*  K  --   --  4.9  --  5.8*   < > 4.8 4.7 4.0 4.3 5.0  CL  --   --  125*  --  122*   < > 119* 118* 109 121* 121*  CO2  --   --  28  --  26   < > _0 GLUCOSE  --   --  175*  --  300*   < > 153* 179* 483* 123* 209*  BUN  --   --  129*  --  117*   < > 74* 96* 89* 91* 89*  CREATININE  --   --  2.27*  --  1.87*   < > 1.76* 1.72* 1.45* 1.53* 1.61*  CALCIUM  --   --  8.3*  --  8.2*   < > 8.3* 8.1* 7.1* 7.6* 8.1*  MG 2.9*  --  3.0*  --  3.4*  --   --  2.8*  --   --  2.8*  PHOS 4.1  --  4.6  --  4.8*  --   --  4.4  --   --  5.2*   < > = values in this interval not displayed.   GFR: Estimated Creatinine Clearance: 101 mL/min (A) (by C-G formula based on SCr of 1.61 mg/dL (H)). Recent Labs  Lab 02/19/22 0920 02/20/22 0359 02/21/22 0354 02/23/22 0317 02/24/22 0313 02/25/22 0309 02/26/22 0502  PROCALCITON 8.62 7.30  --   --   --    --   --   WBC  --  16.6*   < > 16.0* 15.6* 16.2* 12.4*   < > = values in this interval not displayed.    Liver Function Tests: No results for input(s): "AST", "ALT", "ALKPHOS", "BILITOT", "PROT", "ALBUMIN" in the last 168 hours.  ABG    Component Value Date/Time   PHART 7.33 (L) 02/23/2022 2004   PCO2ART 59 (H) 02/23/2022 2004   PO2ART 53 (L) 02/23/2022 2004   HCO3 31.1 (H) 02/23/2022 2004   TCO2 26 03/17/2007 1822   O2SAT 82.6 02/23/2022 2004     Home Medications  Prior to Admission medications   Medication Sig Start Date End Date Taking? Authorizing Provider  losartan (COZAAR) 100 MG tablet Take  100 mg by mouth daily. 02/01/22  Yes [provider]  OZEMPIC, 0.25 OR 0.5 MG/DOSE, 2 MG/3ML SOPN SMARTSIG:0.25 Milligram(s) SUB-Q Once a Week 12/09/21  Yes [provider]  torsemide (DEMADEX) 20 MG tablet Take 20 mg by mouth daily. 12/18/21  Yes [provider]  Aspirin-Salicylamide-Caffeine (BC HEADACHE POWDER PO) Take 1 packet by mouth as needed (for pain).    [provider]  cephALEXin (KEFLEX) 500 MG capsule Take 1 capsule (500 mg total) by mouth 4 (four) times daily. Patient not taking: Reported on 02/11/2022 12/16/18   Davonna Belling, MD  cyclobenzaprine (FLEXERIL) 10 MG tablet Take 0.5-1 tablets (5-10 mg total) by mouth 2 (two) times daily as needed for muscle spasms. Patient not taking: Reported on 02/11/2022 03/12/18   Margarita Mail, PA-C  gabapentin (NEURONTIN) 600 MG tablet Take 600 mg by mouth at bedtime. Patient not taking: Reported on 02/11/2022 10/13/21   [provider]  LABETALOL HCL PO Take by mouth. Patient not taking: Reported on 02/11/2022    [provider]  meloxicam (MOBIC) 15 MG tablet Take 1 tablet (15 mg total) by mouth daily. Take 1 daily with food. Patient not taking: Reported on 02/11/2022 03/12/18   Margarita Mail, PA-C  metFORMIN (GLUCOPHAGE) 500 MG tablet Take 500 mg by mouth 2 (two) times daily with a  meal. Patient not taking: Reported on 02/11/2022    [provider]  METFORMIN HCL ER, MOD, PO Take by mouth. Patient not taking: Reported on 02/11/2022    [provider]  METOPROLOL SUCCINATE ER PO Take by mouth. Patient not taking: Reported on 02/11/2022    [provider]  oxyCODONE-acetaminophen (PERCOCET) 10-325 MG tablet Take 1 tablet by mouth See admin instructions. Take 1 tablet by mouth 4-5 times a day as needed for pain Patient not taking: Reported on 02/11/2022    [provider]  oxyCODONE-acetaminophen (PERCOCET/ROXICET) 5-325 MG tablet Take 1 tablet by mouth every 8 (eight) hours as needed for severe pain. Patient not taking: Reported on 02/11/2022 12/16/18   Davonna Belling, MD  predniSONE (DELTASONE) 20 MG tablet Take 2 tablets (40 mg total) by mouth daily. Patient not taking: Reported on 02/11/2022 04/04/15   Davonna Belling, MD   Hospital Scheduled Meds:  artificial tears  1 Application Both Eyes L2G   Chlorhexidine Gluconate Cloth  6 each Topical Q0600   feeding supplement (PROSource TF)  90 mL Per Tube TID   free water  200 mL Per Tube Q4H   insulin starter kit- pen needles  1 kit Other Once   ipratropium-albuterol  3 mL Nebulization Q6H   living well with diabetes book   Does not apply Once   mouth rinse  15 mL Mouth Rinse Q2H   oxyCODONE  10 mg Per Tube Q6H   pantoprazole sodium  40 mg Per Tube QHS   patiromer  16.8 g Oral Daily   phenobarbital  97.2 mg Per Tube TID   polyethylene glycol  17 g Per Tube Daily   senna-docusate  1 tablet Per Tube BID   sodium chloride flush  10-40 mL Intracatheter Q12H   sodium chloride flush  3 mL Intravenous Q12H   Continuous Infusions:  sodium chloride Stopped (02/25/22 1630)   sodium chloride Stopped (02/11/22 2011)   dextrose 100 mL/hr at 02/26/22 0654   feeding supplement (VITAL 1.5 CAL) 70 mL/hr at 02/26/22 0300   fentaNYL infusion INTRAVENOUS 200 mcg/hr (02/26/22 0300)   insulin 15  Units/hr (02/26/22 4010)  midazolam 6 mg/hr (02/26/22 0300)   PRN Meds:.sodium chloride, acetaminophen **OR** acetaminophen, dextrose, docusate sodium, fentaNYL, midazolam, ondansetron **OR** ondansetron (ZOFRAN) IV, mouth rinse, polyethylene glycol, sodium chloride flush, sodium chloride flush, traZODone, vecuronium      Active Hospital Problem list    Patient Active Problem List   Diagnosis Date Noted   Acute respiratory failure ([place]) 02/11/2022   Respiratory failure ([place]) 02/11/2022   Acute respiratory failure with hypoxia and hypercarbia ([facility]) 02/11/2022   Acute on chronic systolic CHF (congestive heart failure) ([place]) 02/11/2022   Hypertensive urgency 02/11/2022   Type 2 diabetes mellitus with peripheral neuropathy ([place]) 02/11/2022   OBSTRUCTIVE SLEEP APNEA 04/30/2009    Assessment & Plan:   46 yo morbidly obese AAM with severe decompensated acute systolic and diastolic heart failure with previously documented severe OSA/OHS, noncompliant with CPAP, active smoker, leading to severe hypoxic resp failure with encephalopathy and difficulty with sedation   Severe acute on chronic hypoxic and hypercapnic respiratory failure due to CAP & acute decompensation of chronic heart failure Persistent hypoxemia despite trial of several ventilator strategies -Full vent support, implement lung protective strategies -Plateau pressures less than 30 cm H20 -Wean FiO2 & PEEP as tolerated to maintain O2 sats >88% -Follow intermittent Chest X-ray & ABG as needed -Spontaneous Breathing Trials when respiratory parameters met and mental status permits -Implement VAP Bundle -Prn Bronchodilators -will NOT perform SAT/SBT due to high FiO2 requirements, asynchrony off sedation  -Recruitment maneuvers every 4h and as needed. -Patient cannot be proned, due to weight limit for safe proning and unstable airway (ET tube with constant need of repositioning) -s/p bronchoscopy with removal of ispissated mucus  plugging bilaterally with interval improvement post procedure. -Diuresis as BP and renal function permits -s/p Tracheostomy size 8 proximal XLT Shiley   Community-acquired pneumonia: Streptococcus pneumoniae and Haemophilus influenzae ~ TREATED -Monitor fever curve -Trend WBC's & Procalcitonin -Follow cultures as above -Completed course of ABX as above  Acute on chronic systolic and diastolic heart failure LVEF 40 to 45% consistent with prior studies performed at [facility] -Continuous cardiac monitoring -Maintain MAP >65 -Vasopressors as needed to maintain MAP goal ~ weaned off -Trend lactic acid until normalized -HS Troponin peaked at 24 -2D Echocardiogram shows diastolic dysfunction and enlarged left and right atria consistent with restrictive physiology -Diuresis as blood pressure and renal function permits   ACUTE KIDNEY INJURY/Renal Failure -STAGE 4, Element of cardiorenal syndrome ~ IMPROVING Hypernatremia ~ 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 -Monitor I&O's / urinary output -Follow BMP -Ensure adequate renal perfusion -Avoid nephrotoxic agents as able -Replace electrolytes as indicated -Increase free water flushes to 200 ml q4hrs and D5W infusion _0  ml/hr -Nephrology- on case, appreciate input      Intake/Output Summary (Last 24 hours) at 02/26/2022 0721 Last data filed at 02/26/2022 0541 Gross per 24 hour  Intake 3810.16 ml  Output 5175 ml  Net -1364.84 ml       Latest Ref Rng & Units 02/26/2022    5:02 AM 02/25/2022    3:42 PM 02/25/2022   11:07 AM  BMP  Glucose 70 - 99 mg/dL 209  123  483   BUN 6 - 20 mg/dL 89  91  89   Creatinine 0.61 - 1.24 mg/dL 1.61  1.53  1.45   Sodium 135 - 145 mmol/L 154  152  140   Potassium 3.5 - 5.1 mmol/L 5.0  4.3  4.0   Chloride 98 - 111 mmol/L 121  121  109   CO2 22 - 32 mmol/L _0 Calcium 8.9 - 10.3 mg/dL 8.1  7.6  7.1      Diabetes Mellitus -CBG's q4h; Target range of 140 to 180 -SSI -Follow ICU Hypo/Hyperglycemia protocol -HgbA1c  10.1 -Hold Metformin  GI GI PROPHYLAXIS PPI  NUTRITIONAL STATUS DIET-->TF's as tolerated Constipation protocol as indicated  ACUTE ANEMIA- -Monitor for S/Sx of bleeding -Trend CBC -Heparin SQ for VTE Prophylaxis (on hold 8/8 for Trach) -Transfuse for Hgb <7     Diet: Tube feeds (on hold 8/8 for Trach) Pain/Anxiety/Delirium protocol (if indicated): Yes (RASS goal -1 to -2) VAP protocol (if indicated): Yes DVT prophylaxis: Heparin SQ GI prophylaxis: PPI Glucose control:  SSI Yes Central venous access:  PICC, and is still needed Arterial line:  yes, and is still needed Foley:  Yes, and it is still needed Mobility:  bed rest  PT consulted: N/A Last date of multidisciplinary goals of care discussion [8/8] Code Status:  full code Disposition: ICU  8/8: Pt's daughter updated at bedside.   IMAGING    -CT CHEST WITH MULTIFOCAL BILATERAL PNEUMONIA    Total critical care time: 35 minutes    Donell Beers, [place] Pager [phone] (please enter 7 digits) PCCM Consult Pager [phone] (please enter 7 digits)

## 2022-02-26 NOTE — Progress Notes (Signed)
Recruitment maneuver performed with ventilator check. PC 30, 10RR, 3s Ti, 100% FiO2, Peep 14 for 2 minutes. Patient tolerated well.

## 2022-02-26 NOTE — Consult Note (Signed)
PHARMACY CONSULT NOTE  Pharmacy Consult for Electrolyte Monitoring and Replacement   Recent Labs: Potassium (mmol/L)  Date Value  02/26/2022 5.0   Magnesium (mg/dL)  Date Value  02/26/2022 2.8 (H)   Calcium (mg/dL)  Date Value  02/26/2022 8.1 (L)   Albumin (g/dL)  Date Value  02/11/2022 3.5   Phosphorus (mg/dL)  Date Value  02/26/2022 5.2 (H)   Sodium (mmol/L)  Date Value  02/26/2022 154 (H)   Assessment: Patient is a 46 y/o M with medical history including DM, HTN, pancreatitis, tobacco use disorder, systolic CHF, OSA, DM c/b diabetic neuropathy, lumbar radiculopathy, morbid obesity who is admitted with acute respiratory failure in setting of acute CHF and hypertensive urgency. Patient is currently intubated, sedated, and on mechanical ventilation in the ICU. Pharmacy consulted to assist with electrolyte monitoring and replacement as indicated.  Nutrition: Tube feeds at 70 mL/hr (~ 1.68 L/day) + free water 200 mL q4h (1.2 L/day)   Diuretics: IV Lasix 40 mg BID (discontinued 8/1 for AKI) >> IV Lasix 40 mg x 1 trial on 8/7 and repeated 8/8  MIVF: D5w at 100 cc/hr (2.4 L/day)  Misc: DDAVP 4 mcg given 8/5 and 8/6  Nephrology consulted for AKI, patient appears to be experiencing renal recovery  Goal of Therapy:  Electrolytes within normal limits  Plan:  --Na 154, worse today. Likely secondary to Lasix given yesterday. Free water adjusted per PCCM --K 5, Scr stable. Potassium starting to trend back up. Continue with Veltassa at 16.8 g daily for now --Magnesium & phosphorous elevated in setting of renal dysfunction --Re-check electrolytes with AM labs tomorrow  Benita Gutter  02/26/2022 8:02 AM

## 2022-02-26 NOTE — Progress Notes (Addendum)
[place], [place] 02/26/22  Subjective:   Hospital day # 15  [name] is a 46 y.o.  male with past medical history of diabetes, hypertension, sCHF, OSA, and obesity, who was admitted to [facility] on 02/11/2022 for Acute respiratory failure ([place]) [J96.00] Respiratory failure ([place]) [J96.90] Peripheral edema [R60.9] Acute respiratory failure with hypoxia and hypercapnia ([place]) [J96.01, J96.02]  Update: Patient seen and evaluated at bedside in ICU No family at bedside Patient received trach yesterday, currently weaning oxygen requirement.  85% FiO2 Remains sedated with fentanyl and Versed Insulin drip ongoing D5 at 100 mL/h, receiving albumin Foley and Flexi-Seal remain in place No lower extremity edema Receiving tube feeds, 70 mL/hr Febrile overnight  Renal: 08/08 0701 - 08/09 0700 In: 3810.2 [I.V.:2634.6; NG/GT:1125.6] Out: 5175 [Urine:5025; Stool:150] Lab Results  Component Value Date   CREATININE 1.61 (H) 02/26/2022   CREATININE 1.53 (H) 02/25/2022   CREATININE 1.45 (H) 02/25/2022     Objective:  Vital signs in last 24 hours:  Temp:  [99.8 F (37.7 C)-100.8 F (38.2 C)] 100.8 F (38.2 C) (08/09 0800) Pulse Rate:  [95-104] 101 (08/09 1100) Resp:  [25-32] 30 (08/09 1100) BP: (109-129)/(60-69) 114/62 (08/09 1100) SpO2:  [90 %-98 %] 95 % (08/09 1147) Arterial Line BP: (88-128)/(54-69) 111/61 (08/09 1100) FiO2 (%):  [80 %-100 %] 80 % (08/09 1147) Weight:  [199 kg] 199 kg (08/09 0500)  Weight change:  Filed Weights   02/23/22 0500 02/24/22 0500 02/26/22 0500  Weight: (!) 205.4 kg (!) 205.9 kg (!) 199 kg    Intake/Output:    Intake/Output Summary (Last 24 hours) at 02/26/2022 1205 Last data filed at 02/26/2022 1100 Gross per 24 hour  Intake 9136.78 ml  Output 5575 ml  Net 3561.78 ml      Physical Exam: General: Critically ill-appearing, laying in the bed  HEENT NG tube in place, angioedema  Pulm/lungs Ventilator  assisted, trach placed 02/25/2022  CVS/Heart Regular  Abdomen:  Soft, nontender, nondistended  Extremities: No dependent edema  Neurologic: Sedated  Skin: Warm, dry  Access: None       Basic Metabolic Panel:  Recent Labs  Lab 02/22/22 0919 02/22/22 1450 02/23/22 0317 02/23/22 2004 02/24/22 0313 02/24/22 1709 02/24/22 2202 02/25/22 0309 02/25/22 1107 02/25/22 1542 02/26/22 0502  NA 159*   < > 159*   < > 155*   < > 153* 150* 140 152* 154*  K  --   --  4.9  --  5.8*   < > 4.8 4.7 4.0 4.3 5.0  CL  --   --  125*  --  122*   < > 119* 118* 109 121* 121*  CO2  --   --  28  --  26   < > $R'27 27 24 27 29  'kz$ GLUCOSE  --   --  175*  --  300*   < > 153* 179* 483* 123* 209*  BUN  --   --  129*  --  117*   < > 74* 96* 89* 91* 89*  CREATININE  --   --  2.27*  --  1.87*   < > 1.76* 1.72* 1.45* 1.53* 1.61*  CALCIUM  --   --  8.3*  --  8.2*   < > 8.3* 8.1* 7.1* 7.6* 8.1*  MG 2.9*  --  3.0*  --  3.4*  --   --  2.8*  --   --  2.8*  PHOS 4.1  --  4.6  --  4.8*  --   --  4.4  --   --  5.2*   < > = values in this interval not displayed.      CBC: Recent Labs  Lab 02/22/22 0424 02/23/22 0317 02/24/22 0313 02/25/22 0309 02/26/22 0502  WBC 15.3* 16.0* 15.6* 16.2* 12.4*  NEUTROABS 12.3* 13.3* 13.7* 13.2* 10.4*  HGB 9.4* 9.4* 9.4* 9.6* 9.5*  HCT 34.0* 33.8* 34.1* 34.6* 34.0*  MCV 94.4 95.8 94.5 94.5 96.0  PLT 194 180 193 227 227      No results found for: "HEPBSAG", "HEPBSAB", "HEPBIGM"    Microbiology:  Recent Results (from the past 240 hour(s))  Culture, Respiratory w Gram Stain     Status: None   Collection Time: 02/16/22  4:27 PM   Specimen: Tracheal Aspirate; Respiratory  Result Value Ref Range Status   Specimen Description   Final    TRACHEAL ASPIRATE Performed at [facility], [address] [place], [place] [address]    Special Requests   Final    NONE Performed at [facility], [address] [place], [place] [address]    Gram Stain   Final     RARE WBC PRESENT,BOTH PMN AND MONONUCLEAR NO ORGANISMS SEEN    Culture   Final    NO GROWTH Performed at [facility], [place] [address] [place], [place] [address]    Report Status 02/20/2022 FINAL  Final  Culture, Respiratory w Gram Stain     Status: None   Collection Time: 02/21/22 11:41 AM   Specimen: Bronchoalveolar Lavage; Respiratory  Result Value Ref Range Status   Specimen Description   Final    BRONCHIAL ALVEOLAR LAVAGE Performed at [facility], [address] [place], [place] [address]    Special Requests   Final    NONE Performed at [facility], [address] [place], [place] [address]    Gram Stain   Final    FEW WBC PRESENT,BOTH PMN AND MONONUCLEAR NO ORGANISMS SEEN    Culture   Final    NO GROWTH 2 DAYS Performed at [facility], [place] [address] [place], [place] [address]    Report Status 02/23/2022 FINAL  Final    Coagulation Studies: Recent Labs    02/25/22 0309  LABPROT 14.7  INR 1.2     Urinalysis: No results for input(s): "COLORURINE", "LABSPEC", "PHURINE", "GLUCOSEU", "HGBUR", "BILIRUBINUR", "KETONESUR", "PROTEINUR", "UROBILINOGEN", "NITRITE", "LEUKOCYTESUR" in the last 72 hours.  Invalid input(s): "APPERANCEUR"    Imaging: DG Chest Port 1 View  Result Date: 02/26/2022 CLINICAL DATA:  Hypoxic respiratory failure. EXAM: PORTABLE CHEST 1 VIEW COMPARISON:  Portable chest 02/23/2022 FINDINGS: 5:06 a.m. Interval tracheostomy cannula insertion with the tip 7 cm from the carina. Right PICC tip is in the distal SVC. Feeding tube passing well into the stomach with the radiopaque tip not filmed. Moderate to severe cardiomegaly. There is still mild perihilar vascular congestion but this does show improvement. Interstitial edema has regressed from the upper zones with mild interstitial edema remaining in the bases. There is persistent patchy consolidation in the lower lung fields and small pleural effusions, both findings with  interval improvement. The upper lung fields are essentially clear today. IMPRESSION: 1. Improvement in perihilar vascular congestion and interstitial edema. 2. Improvement in bilateral lung opacities with clearance of the upper lung zones and persistent but improved patchy opacities in the lower zones. 3. Small pleural effusions with improvement. 4. Interval  tracheostomy cannula insertion, the tip 7 cm from carina. 5. NGT has been exchanged for a feeding tube which is well into the stomach but the radiopaque tip is not included today. Electronically Signed   By: Telford Nab M.D.   On: 02/26/2022 06:23   DG Abd 1 View  Result Date: 02/25/2022 CLINICAL DATA:  NG tube placement EXAM: ABDOMEN - 1 VIEW COMPARISON:  02/18/2022 FINDINGS: Tip of feeding tube is seen in the region of the antrum of the stomach. Bowel gas pattern in the visualized upper portions of abdomen is unremarkable. Lower abdomen, right side of abdomen and pelvis are not included in the image. Transverse diameter heart is increased IMPRESSION: Tip of NG tube is seen in the antrum of the stomach. Electronically Signed   By: Elmer Picker M.D.   On: 02/25/2022 12:00     Medications:    sodium chloride Stopped (02/11/22 2011)   albumin human 60 mL/hr at 02/26/22 1100   dextrose 100 mL/hr at 02/26/22 1134   feeding supplement (VITAL 1.5 CAL) 70 mL/hr at 02/26/22 1100   fentaNYL infusion INTRAVENOUS 175 mcg/hr (02/26/22 1100)   insulin 9.5 Units/hr (02/26/22 1129)   midazolam 5 mg/hr (02/26/22 1100)    artificial tears  1 Application Both Eyes Y6V   Chlorhexidine Gluconate Cloth  6 each Topical Q0600   clonazepam  2 mg Per Tube BID   feeding supplement (PROSource TF)  90 mL Per Tube TID   free water  200 mL Per Tube Q4H   heparin injection (subcutaneous)  5,000 Units Subcutaneous Q8H   insulin starter kit- pen needles  1 kit Other Once   ipratropium-albuterol  3 mL Nebulization Q6H   living well with diabetes book   Does not  apply Once   mouth rinse  15 mL Mouth Rinse Q2H   oxyCODONE  10 mg Per Tube Q6H   pantoprazole sodium  40 mg Per Tube QHS   patiromer  16.8 g Oral Daily   polyethylene glycol  17 g Per Tube Daily   senna-docusate  1 tablet Per Tube BID   sodium chloride flush  10-40 mL Intracatheter Q12H   acetaminophen **OR** acetaminophen, dextrose, docusate sodium, fentaNYL, midazolam, ondansetron **OR** ondansetron (ZOFRAN) IV, mouth rinse, polyethylene glycol, sodium chloride flush, traZODone  Assessment/ Plan:  46 y.o. male with medical problems of poorly controlled diabetes, hypertension, systolic CHF, obstructive sleep apnea, obesity    admitted on 02/11/2022 for Acute respiratory failure (HCC) [J96.00] Respiratory failure (HCC) [J96.90] Peripheral edema [R60.9] Acute respiratory failure with hypoxia and hypercapnia (HCC) [J96.01, J96.02]  #Acute kidney injury with hyperkalemia/hypernatremia.  Baseline creatinine 0.96 from February 11, 2022. Suspected secondary to fever, hypotension and sepsis. No IV contrast exposure or nephrotoxic agents.   Potassium 5.0 today, continue Veltassa to manage suspected hyperkalemia due to tube feeds.  Sodium increased today, 154.  Free water flushes decreased to 100 mL every 4 hours yesterday.  Agree with increasing to 200 mL every 2 hours, may require greater increase to manage elevated sodium.  Renal function remained stable with adequate urine output.  Will continue to monitor   #Acute respiratory failure Ventilator assisted.  Trach placed on 02/25/2022.   #Acute exacerbation of systolic and diastolic CHF 2D echo from February 11, 2022-LVEF 55 to 60%, moderate LVH, grade 1 diastolic dysfunction, enlarged right ventricle, moderately dilated right atrium, moderately dilated left atrium.  Fluid volume stable  #Poorly controlled diabetes type 2  Lab Results  Component Value Date  HGBA1C 10.1 (H) 02/11/2022  Patient currently on insulin drip, managed by primary  team.    LOS: [place] 8/9/202312:05 PM  [place], [place]

## 2022-02-27 DIAGNOSIS — I5022 Chronic systolic (congestive) heart failure: Secondary | ICD-10-CM | POA: Diagnosis not present

## 2022-02-27 DIAGNOSIS — F17210 Nicotine dependence, cigarettes, uncomplicated: Secondary | ICD-10-CM

## 2022-02-27 DIAGNOSIS — I5023 Acute on chronic systolic (congestive) heart failure: Secondary | ICD-10-CM | POA: Diagnosis not present

## 2022-02-27 DIAGNOSIS — G4733 Obstructive sleep apnea (adult) (pediatric): Secondary | ICD-10-CM

## 2022-02-27 DIAGNOSIS — J9602 Acute respiratory failure with hypercapnia: Secondary | ICD-10-CM | POA: Diagnosis not present

## 2022-02-27 DIAGNOSIS — J181 Lobar pneumonia, unspecified organism: Secondary | ICD-10-CM | POA: Diagnosis not present

## 2022-02-27 DIAGNOSIS — J9601 Acute respiratory failure with hypoxia: Secondary | ICD-10-CM | POA: Diagnosis not present

## 2022-02-27 DIAGNOSIS — E870 Hyperosmolality and hypernatremia: Secondary | ICD-10-CM | POA: Diagnosis not present

## 2022-02-27 LAB — RESPIRATORY PANEL BY PCR

## 2022-02-27 LAB — GLUCOSE, CAPILLARY
Glucose-Capillary: 156 mg/dL — ABNORMAL HIGH (ref 70–99)
Glucose-Capillary: 166 mg/dL — ABNORMAL HIGH (ref 70–99)
Glucose-Capillary: 169 mg/dL — ABNORMAL HIGH (ref 70–99)
Glucose-Capillary: 174 mg/dL — ABNORMAL HIGH (ref 70–99)
Glucose-Capillary: 177 mg/dL — ABNORMAL HIGH (ref 70–99)
Glucose-Capillary: 177 mg/dL — ABNORMAL HIGH (ref 70–99)
Glucose-Capillary: 178 mg/dL — ABNORMAL HIGH (ref 70–99)
Glucose-Capillary: 181 mg/dL — ABNORMAL HIGH (ref 70–99)
Glucose-Capillary: 183 mg/dL — ABNORMAL HIGH (ref 70–99)
Glucose-Capillary: 187 mg/dL — ABNORMAL HIGH (ref 70–99)
Glucose-Capillary: 195 mg/dL — ABNORMAL HIGH (ref 70–99)
Glucose-Capillary: 195 mg/dL — ABNORMAL HIGH (ref 70–99)
Glucose-Capillary: 204 mg/dL — ABNORMAL HIGH (ref 70–99)
Glucose-Capillary: 213 mg/dL — ABNORMAL HIGH (ref 70–99)
Glucose-Capillary: 220 mg/dL — ABNORMAL HIGH (ref 70–99)
Glucose-Capillary: 239 mg/dL — ABNORMAL HIGH (ref 70–99)

## 2022-02-27 LAB — URINALYSIS, COMPLETE (UACMP) WITH MICROSCOPIC
Bilirubin Urine: NEGATIVE
Glucose, UA: NEGATIVE mg/dL
Ketones, ur: NEGATIVE mg/dL
Leukocytes,Ua: NEGATIVE
Nitrite: NEGATIVE
Protein, ur: 100 mg/dL — AB
Specific Gravity, Urine: 1.013 (ref 1.005–1.030)
pH: 5 (ref 5.0–8.0)

## 2022-02-27 LAB — PROTIME-INR
INR: 1.2 (ref 0.8–1.2)
Prothrombin Time: 15.2 seconds (ref 11.4–15.2)

## 2022-02-27 LAB — BLOOD GAS, ARTERIAL
Acid-Base Excess: 0.5 mmol/L (ref 0.0–2.0)
Bicarbonate: 28.7 mmol/L — ABNORMAL HIGH (ref 20.0–28.0)
FIO2: 80 %
MECHVT: 500 mL
Mechanical Rate: 30
O2 Saturation: 97.6 %
PEEP: 14 cmH2O
Patient temperature: 37
RATE: 30 resp/min
pCO2 arterial: 61 mmHg — ABNORMAL HIGH (ref 32–48)
pH, Arterial: 7.28 — ABNORMAL LOW (ref 7.35–7.45)
pO2, Arterial: 88 mmHg (ref 83–108)

## 2022-02-27 LAB — BASIC METABOLIC PANEL
Anion gap: 7 (ref 5–15)
Anion gap: 4 — ABNORMAL LOW (ref 5–15)
Anion gap: 5 (ref 5–15)
BUN: 81 mg/dL — ABNORMAL HIGH (ref 6–20)
BUN: 79 mg/dL — ABNORMAL HIGH (ref 6–20)
BUN: 90 mg/dL — ABNORMAL HIGH (ref 6–20)
CO2: 28 mmol/L (ref 22–32)
CO2: 26 mmol/L (ref 22–32)
CO2: 27 mmol/L (ref 22–32)
Calcium: 7.9 mg/dL — ABNORMAL LOW (ref 8.9–10.3)
Calcium: 7.4 mg/dL — ABNORMAL LOW (ref 8.9–10.3)
Calcium: 7.8 mg/dL — ABNORMAL LOW (ref 8.9–10.3)
Chloride: 119 mmol/L — ABNORMAL HIGH (ref 98–111)
Chloride: 112 mmol/L — ABNORMAL HIGH (ref 98–111)
Chloride: 119 mmol/L — ABNORMAL HIGH (ref 98–111)
Creatinine, Ser: 1.65 mg/dL — ABNORMAL HIGH (ref 0.61–1.24)
Creatinine, Ser: 2 mg/dL — ABNORMAL HIGH (ref 0.61–1.24)
Creatinine, Ser: 2.08 mg/dL — ABNORMAL HIGH (ref 0.61–1.24)
GFR, Estimated: 52 mL/min — ABNORMAL LOW (ref >60)
GFR, Estimated: 41 mL/min — ABNORMAL LOW (ref >60)
GFR, Estimated: 40 mL/min — ABNORMAL LOW (ref >60)
Glucose, Bld: 177 mg/dL — ABNORMAL HIGH (ref 70–99)
Glucose, Bld: 476 mg/dL — ABNORMAL HIGH (ref 70–99)
Glucose, Bld: 239 mg/dL — ABNORMAL HIGH (ref 70–99)
Potassium: 5.3 mmol/L — ABNORMAL HIGH (ref 3.5–5.1)
Potassium: 5.4 mmol/L — ABNORMAL HIGH (ref 3.5–5.1)
Potassium: 5.8 mmol/L — ABNORMAL HIGH (ref 3.5–5.1)
Sodium: 154 mmol/L — ABNORMAL HIGH (ref 135–145)
Sodium: 142 mmol/L (ref 135–145)
Sodium: 151 mmol/L — ABNORMAL HIGH (ref 135–145)

## 2022-02-27 LAB — CBC WITH DIFFERENTIAL/PLATELET
Abs Immature Granulocytes: 0.09 10*3/uL — ABNORMAL HIGH (ref 0.00–0.07)
Basophils Absolute: 0 10*3/uL (ref 0.0–0.1)
Basophils Relative: 0 %
Eosinophils Absolute: 0.3 10*3/uL (ref 0.0–0.5)
Eosinophils Relative: 2 %
HCT: 33 % — ABNORMAL LOW (ref 39.0–52.0)
Hemoglobin: 9.1 g/dL — ABNORMAL LOW (ref 13.0–17.0)
Immature Granulocytes: 1 %
Lymphocytes Relative: 8 %
Lymphs Abs: 1 10*3/uL (ref 0.7–4.0)
MCH: 26.7 pg (ref 26.0–34.0)
MCHC: 27.6 g/dL — ABNORMAL LOW (ref 30.0–36.0)
MCV: 96.8 fL (ref 80.0–100.0)
Monocytes Absolute: 0.5 10*3/uL (ref 0.1–1.0)
Monocytes Relative: 4 %
Neutro Abs: 10.6 10*3/uL — ABNORMAL HIGH (ref 1.7–7.7)
Neutrophils Relative %: 85 %
Platelets: 227 10*3/uL (ref 150–400)
RBC: 3.41 MIL/uL — ABNORMAL LOW (ref 4.22–5.81)
RDW: 15.5 % (ref 11.5–15.5)
WBC: 12.5 10*3/uL — ABNORMAL HIGH (ref 4.0–10.5)
nRBC: 0 % (ref 0.0–0.2)

## 2022-02-27 LAB — PROCALCITONIN: Procalcitonin: 0.17 ng/mL

## 2022-02-27 LAB — PHOSPHORUS
Phosphorus: 5.3 mg/dL — ABNORMAL HIGH (ref 2.5–4.6)
Phosphorus: 3.8 mg/dL (ref 2.5–4.6)

## 2022-02-27 LAB — MAGNESIUM
Magnesium: 2.8 mg/dL — ABNORMAL HIGH (ref 1.7–2.4)
Magnesium: 2.5 mg/dL — ABNORMAL HIGH (ref 1.7–2.4)

## 2022-02-27 LAB — C-REACTIVE PROTEIN: CRP: 19.7 mg/dL — ABNORMAL HIGH (ref <1.0)

## 2022-02-27 LAB — APTT: aPTT: 25 seconds (ref 24–36)

## 2022-02-27 LAB — MRSA NEXT GEN BY PCR, NASAL: MRSA by PCR Next Gen: DETECTED — AB

## 2022-02-27 MED ORDER — PIPERACILLIN-TAZOBACTAM 4.5 G IVPB
4.5000 g | Freq: Three times a day (TID) | INTRAVENOUS | Status: DC
  Administered 2022-02-27 – 2022-03-01 (×5): 4.5 g via INTRAVENOUS
  Filled 2022-02-27 (×6): qty 100

## 2022-02-27 MED ORDER — FAMOTIDINE IN NACL 20-0.9 MG/50ML-% IV SOLN
20.0000 mg | INTRAVENOUS | Status: DC
  Administered 2022-02-27: 20 mg via INTRAVENOUS
  Filled 2022-02-27: qty 50

## 2022-02-27 MED ORDER — PIPERACILLIN-TAZOBACTAM 4.5 G IVPB
4.5000 g | Freq: Three times a day (TID) | INTRAVENOUS | Status: DC
Start: 2022-02-27 — End: 2022-02-27
  Administered 2022-02-27: 4.5 g via INTRAVENOUS
  Filled 2022-02-27: qty 100

## 2022-02-27 MED ORDER — MUPIROCIN 2 % EX OINT
TOPICAL_OINTMENT | Freq: Two times a day (BID) | CUTANEOUS | Status: DC
Start: 2022-02-27 — End: 2022-04-07
  Administered 2022-02-27 – 2022-03-28 (×18): 1 via NASAL
  Filled 2022-02-27 (×4): qty 22

## 2022-02-27 MED ORDER — VECURONIUM BROMIDE 10 MG IV SOLR
INTRAVENOUS | Status: AC
  Filled 2022-02-27: qty 20

## 2022-02-27 MED ORDER — CARVEDILOL 3.125 MG PO TABS
3.1250 mg | ORAL_TABLET | Freq: Two times a day (BID) | ORAL | Status: DC
  Administered 2022-02-27 – 2022-02-28 (×2): 3.125 mg via ORAL
  Filled 2022-02-27 (×2): qty 1

## 2022-02-27 MED ORDER — LORAZEPAM 2 MG/ML IJ SOLN
4.0000 mg | Freq: Once | INTRAMUSCULAR | Status: AC
  Administered 2022-02-27: 4 mg via INTRAVENOUS

## 2022-02-27 MED ORDER — LORAZEPAM 2 MG/ML IJ SOLN
INTRAMUSCULAR | Status: AC
  Filled 2022-02-27: qty 2

## 2022-02-27 MED ORDER — ALBUMIN HUMAN 25 % IV SOLN
12.5000 g | Freq: Once | INTRAVENOUS | Status: DC
  Filled 2022-02-27: qty 50

## 2022-02-27 MED ORDER — FUROSEMIDE 10 MG/ML IJ SOLN
40.0000 mg | Freq: Once | INTRAMUSCULAR | Status: DC
  Filled 2022-02-27: qty 4

## 2022-02-27 MED ORDER — DIPHENHYDRAMINE HCL 50 MG/ML IJ SOLN
25.0000 mg | Freq: Two times a day (BID) | INTRAMUSCULAR | Status: DC
  Administered 2022-02-27 – 2022-02-28 (×2): 25 mg via INTRAVENOUS
  Filled 2022-02-27 (×2): qty 1

## 2022-02-27 NOTE — Progress Notes (Signed)
Palliative: Chart review completed.  Conference with CCM.  At this point goals are set for full scope/full code.  Time for outcomes.   Palliative team to sign off.  Please reconsult as needed.  Plan: At this point goals are set for full scope/full code, time for outcomes.    No charge Quinn Axe, NP Palliative medicine team Team phone [phone] Greater than 50% of this time was spent counseling and coordinating care related to the above assessment and plan.

## 2022-02-27 NOTE — Progress Notes (Signed)
Recruitment maneuver performed with ventilator check. PC 30, 10RR, 3s Ti, 100% FiO2, Peep 14 for 2 minutes. Patient tolerated well.

## 2022-02-27 NOTE — Consult Note (Signed)
NAME: Samuel Chavez  DOB: 1976-05-19  MRN: [id number]  Date/Time: 02/27/2022 2:53 PM  REQUESTING PROVIDER: Temple Pacini Subjective:  REASON FOR CONSULT: Fever ?No history available from patient. Chart reviewed- Samuel Chavez is a 46 y.o. with a history of COPD, CHF, has sleep apnea Presented to the ED on 02/11/2022 with bilateral leg swelling and worsening shortness of breath of 1 month. He was taking torsemide without improvement and had felt that he had gained 30 pounds weight. In the ED on admission BP 187/102, temperature 99.1, pulse 99, sats 89%, weight 330 pounds and height 5'9 "i His labs revealed a venous pCO2 of 87, bicarb of 40 He was admitted with acute respiratory failure with hypoxia and hypercapnia and was treated initially with diuretics as well as Solu-Medrol and nebulizer.  He was placed on BiPAP but because of worsening mental status and concern for worsening hypercapnic respiratory failure he was intubated for airway protection.  It was a difficult intubation and there was trouble maintaining his sats above 80 even with tracking.  So he was given ROC paralytics with improvement in the oxygenation.  He was admitted to the ICU. HE was diagnosed with strep pneumo and h.influenza resp infection  and was given ceftriaxone He underwent Tracheostomy on 02/25/22 and fever since that evening ENT saw this patient today and trach site looks good and also there was no clinical evidence of mucor  But he has tongue injury because of swelling and biting on it  Today he was started on IV zosyn   I Past Medical History:  Diagnosis Date   Diabetes mellitus without complication ([place])    Hypertension    Pancreatitis     Past Surgical History:  Procedure Laterality Date   CHOLECYSTECTOMY     TRACHEOSTOMY TUBE PLACEMENT N/A 02/25/2022   Procedure: TRACHEOSTOMY;  Surgeon: Clyde Canterbury, MD;  Location: [facility] ORS;  Service: ENT;  Laterality: N/A;    Social History   Socioeconomic History    Marital status: Single    Spouse name: Not on file   Number of children: Not on file   Years of education: Not on file   Highest education level: Not on file  Occupational History   Not on file  Tobacco Use   Smoking status: Every Day    Packs/day: 1.00    Types: Cigarettes   Smokeless tobacco: Never  Substance and Sexual Activity   Alcohol use: No   Drug use: No   Sexual activity: Not on file  Other Topics Concern   Not on file  Social History Narrative   Not on file   Social Determinants of Health   Financial Resource Strain: Not on file  Food Insecurity: Not on file  Transportation Needs: Not on file  Physical Activity: Not on file  Stress: Not on file  Social Connections: Not on file  Intimate Partner Violence: Not on file    History reviewed. No pertinent family history. Allergies  Allergen Reactions   Hydrocodone Swelling   I? Current Facility-Administered Medications  Medication Dose Route Frequency Provider Last Rate Last Admin   0.9 %  sodium chloride infusion  250 mL Intravenous Continuous Lang Snow, NP   Held at 02/11/22 2011   acetaminophen (TYLENOL) tablet 650 mg  650 mg Oral Q6H PRN Mansy, Jan A, MD   650 mg at 02/26/22 1922   Or   acetaminophen (TYLENOL) suppository 650 mg  650 mg Rectal Q6H PRN Mansy, Arvella Merles, MD  artificial tears (LACRILUBE) ophthalmic ointment 1 Application  1 Application Both Eyes [id number] Rust-Chester, Britton L, NP   1 Application at 88/41/66 1425   Chlorhexidine Gluconate Cloth 2 % PADS 6 each  6 each Topical A6301 Flora Lipps, MD   6 each at 02/27/22 0500   clonazePAM (KLONOPIN) disintegrating tablet 2 mg  2 mg Per Tube BID Flora Lipps, MD   2 mg at 02/27/22 0944   dextrose 5 % solution   Intravenous Continuous Colon Flattery, NP 150 mL/hr at 02/27/22 1437 Infusion Verify at 02/27/22 1437   dextrose 50 % solution 0-50 mL  0-50 mL Intravenous PRN Bradly Bienenstock, NP       docusate sodium (COLACE) capsule 100 mg   100 mg Oral BID PRN Flora Lipps, MD       feeding supplement (PROSource TF) liquid 90 mL  90 mL Per Tube TID Flora Lipps, MD   90 mL at 02/27/22 0944   feeding supplement (VITAL 1.5 CAL) liquid 1,000 mL  1,000 mL Per Tube Continuous Flora Lipps, MD 70 mL/hr at 02/27/22 1437 Infusion Verify at 02/27/22 1437   fentaNYL (SUBLIMAZE) bolus via infusion 50-100 mcg  50-100 mcg Intravenous Q15 min PRN Flora Lipps, MD   100 mcg at 02/15/22 0350   fentaNYL 2567mg in NS 2583m(1056mml) infusion-PREMIX  50-300 mcg/hr Intravenous Continuous Rust-Chester, Britton L, NP 5 mL/hr at 02/27/22 1437 50 mcg/hr at 02/27/22 1437   free water 200 mL  200 mL Per Tube Q4H NelTeressa LowerP   200 mL at 02/27/22 1220   heparin injection 5,000 Units  5,000 Units Subcutaneous Q8H KasFlora LippsD   5,000 Units at 02/27/22 0502   insulin regular, human (MYXREDLIN) 100 units/ 100 mL infusion   Intravenous Continuous KeeDarel Hong NP 4.6 mL/hr at 02/27/22 1443 4.6 Units/hr at 02/27/22 1443   insulin starter kit- pen needles (English) 1 kit  1 kit Other Once KasFlora LippsD       ipratropium-albuterol (DUONEB) 0.5-2.5 (3) MG/3ML nebulizer solution 3 mL  3 mL Nebulization Q6H KasFlora LippsD   3 mL at 02/27/22 1347   living well with diabetes book MISC   Does not apply Once KasFlora LippsD       midazolam (VERSED) 100 mg/100 mL (1 mg/mL) premix infusion  2-10 mg/hr Intravenous Continuous GonTyler PitaD   Stopped at 02/27/22 1422   midazolam (VERSED) bolus via infusion 2-4 mg  2-4 mg Intravenous Q2H PRN Rust-Chester, BriHuel CoteP   2 mg at 02/23/22 0815   ondansetron (ZOFRAN) tablet 4 mg  4 mg Oral Q6H PRN Mansy, Jan A, MD       Or   ondansetron ([facility] 4 mg  4 mg Intravenous Q6H PRN Mansy, Jan A, MD       Oral care mouth rinse  15 mL Mouth Rinse Q2H KasFlora LippsD   15 mL at 02/27/22 1424   Oral care mouth rinse  15 mL Mouth Rinse PRN KasFlora LippsD       oxyCODONE (Oxy IR/ROXICODONE) immediate  release tablet 10 mg  10 mg Per Tube Q6H KasFlora LippsD   10 mg at 02/27/22 1424   pantoprazole sodium (PROTONIX) 40 mg/20 mL oral suspension 40 mg  40 mg Per Tube QHS ChaLockie Mola RPH   40 mg at 02/26/22 2200   patiromer (VELTASSA) packet 16.8 g  16.8 g Oral Daily BreColon FlatteryP  16.8 g at 02/27/22 0945   piperacillin-tazobactam (ZOSYN) IVPB 4.5 g  4.5 g Intravenous Q8H Kasa, Maretta Bees, MD 200 mL/hr at 02/27/22 1437 Infusion Verify at 02/27/22 1437   polyethylene glycol (MIRALAX / GLYCOLAX) packet 17 g  17 g Per Tube Daily Delena Bali, RPH   17 g at 02/27/22 5449   polyethylene glycol (MIRALAX / GLYCOLAX) packet 17 g  17 g Per Tube Daily PRN Delena Bali, RPH       senna-docusate (Senokot-S) tablet 1 tablet  1 tablet Per Tube BID Teressa Lower, NP   1 tablet at 02/27/22 0943   sodium chloride flush (NS) 0.9 % injection 10-40 mL  10-40 mL Intracatheter Q12H Kasa, Maretta Bees, MD   10 mL at 02/27/22 0945   sodium chloride flush (NS) 0.9 % injection 10-40 mL  10-40 mL Intracatheter PRN Flora Lipps, MD   40 mL at 02/15/22 0907   traZODone (DESYREL) tablet 25 mg  25 mg Per NG tube QHS PRN Darrick Penna, RPH         Abtx:  Anti-infectives (From admission, onward)    Start     Dose/Rate Route Frequency Ordered Stop   02/27/22 1400  piperacillin-tazobactam (ZOSYN) IVPB 4.5 g        4.5 g 200 mL/hr over 30 Minutes Intravenous Every 8 hours 02/27/22 1044     02/17/22 1200  cefTRIAXone (ROCEPHIN) 2 g in sodium chloride 0.9 % 100 mL IVPB        2 g 200 mL/hr over 30 Minutes Intravenous Every 12 hours 02/17/22 1054 02/21/22 2143   02/16/22 1800  vancomycin (VANCOREADY) IVPB 2000 mg/400 mL  Status:  Discontinued        2,000 mg 200 mL/hr over 120 Minutes Intravenous Every 12 hours 02/16/22 1657 02/17/22 1053   02/13/22 2300  vancomycin (VANCOREADY) IVPB 1500 mg/300 mL       See Hyperspace for full Linked Orders Report.   1,500 mg 150 mL/hr over 120 Minutes Intravenous  Once  02/13/22 2020 02/14/22 0212   02/13/22 2200  ceFEPIme (MAXIPIME) 2 g in sodium chloride 0.9 % 100 mL IVPB  Status:  Discontinued        2 g 200 mL/hr over 30 Minutes Intravenous Every 8 hours 02/13/22 2012 02/17/22 1053   02/13/22 2115  vancomycin (VANCOCIN) IVPB 1000 mg/200 mL premix       See Hyperspace for full Linked Orders Report.   1,000 mg 200 mL/hr over 60 Minutes Intravenous  Once 02/13/22 2020 02/13/22 2225   02/13/22 2020  vancomycin variable dose per unstable renal function (pharmacist dosing)  Status:  Discontinued         Does not apply See admin instructions 02/13/22 2020 02/14/22 1017       REVIEW OF SYSTEMS:  NA Objective:  VITALS:  BP 128/64   Pulse (!) 110   Temp (!) 101 F (38.3 C) (Oral)   Resp 16   Ht 5' 9.02" (1.753 m)   Wt (!) 198.5 kg   SpO2 92%   BMI 64.59 kg/m  LDA Foley since 7/25 RT picc since 7/26  PHYSICAL EXAM:  General: sedated , on the vent, extremely obese Head: Normocephalic, without obvious abnormality, atraumatic. Eyes: Conjunctivae clear, anicteric sclerae. Pupils are equal ENT cannot be examined. Tongue was very swollen and sticking out Neck: , symmetrical, no adenopathy, thyroid: non tender no carotid bruit and no JVD. Back: Did not examine Lungs: b/l air entry- decreased bases  Heart: Tachycardia Abdomen: soft Extremities: edema of all extremities Skin: No rashes or lesions. Or bruising Lymph: Cervical, supraclavicular normal. Neurologic: cannot be evaulated Pertinent Labs Lab Results    Latest Reference Range & Units 02/27/22 05:00  Delivery systems  VENTILATOR  FIO2 % 80  Mode  PRESSURE REGULATED VOLUME CONTROL  pH, Arterial 7.35 - 7.45  7.28 (L)  pCO2 arterial 32 - 48 mmHg 61 (H)  pO2, Arterial 83 - 108 mmHg 88  Acid-Base Excess 0.0 - 2.0 mmol/L 0.5  Bicarbonate 20.0 - 28.0 mmol/L 28.7 (H)  O2 Saturation % 97.6      Component Value Date/Time   WBC 12.5 (H) 02/27/2022 0416   RBC 3.41 (L) 02/27/2022 0416    HGB 9.1 (L) 02/27/2022 0416   HCT 33.0 (L) 02/27/2022 0416   PLT 227 02/27/2022 0416   MCV 96.8 02/27/2022 0416   MCH 26.7 02/27/2022 0416   MCHC 27.6 (L) 02/27/2022 0416   RDW 15.5 02/27/2022 0416   LYMPHSABS 1.0 02/27/2022 0416   MONOABS 0.5 02/27/2022 0416   EOSABS 0.3 02/27/2022 0416   BASOSABS 0.0 02/27/2022 0416       Latest Ref Rng & Units 02/27/2022    4:16 AM 02/26/2022    5:02 AM 02/25/2022    3:42 PM  CMP  Glucose 70 - 99 mg/dL 177  209  123   BUN 6 - 20 mg/dL 81  89  91   Creatinine 0.61 - 1.24 mg/dL 1.65  1.61  1.53   Sodium 135 - 145 mmol/L 154  154  152   Potassium 3.5 - 5.1 mmol/L 5.3  5.0  4.3   Chloride 98 - 111 mmol/L 119  121  121   CO2 22 - 32 mmol/L _0 Calcium 8.9 - 10.3 mg/dL 7.9  8.1  7.6       Microbiology: Recent Results (from the past 240 hour(s))  Culture, Respiratory w Gram Stain     Status: None   Collection Time: 02/21/22 11:41 AM   Specimen: Bronchoalveolar Lavage; Respiratory  Result Value Ref Range Status   Specimen Description   Final    BRONCHIAL ALVEOLAR LAVAGE Performed at [facility], [address] [place], [place] [address]    Special Requests   Final    NONE Performed at [facility], [address] [place], [place] [address]    Gram Stain   Final    FEW WBC PRESENT,BOTH PMN AND MONONUCLEAR NO ORGANISMS SEEN    Culture   Final    NO GROWTH 2 DAYS Performed at [facility], [place] [address] [place], [place] [address]    Report Status 02/23/2022 FINAL  Final  Culture, Respiratory w Gram Stain     Status: None (Preliminary result)   Collection Time: 02/26/22  2:37 PM   Specimen: SPU; Respiratory  Result Value Ref Range Status   Specimen Description   Final    SPUTUM Performed at [facility], [address], [place], [place] [address]    Special Requests   Final    NONE Performed at [facility], [place], [place] [address]    Gram Stain   Final     FEW GRAM POSITIVE COCCI IN PAIRS IN CLUSTERS FEW WBC PRESENT, PREDOMINANTLY PMN    Culture   Final    TOO YOUNG TO READ Performed at [facility], [place] [address] [place], [place]  [address]    Report Status PENDING  Incomplete   7/27- tracheal aspirate strep pneumo, Hinfluenza  IMAGING RESULTS: Cardiomegaly and pulmonary vascular congestion  I have personally reviewed the films ?b/l fluid over load    Rt pleural effusion     02/21/22 Groung glass opacities both lungs on CT    Impression/Recommendation ? Acute  respiratory failure with hypercapnia, and hypoxia- intubated on admisison in the ED and now has tracheostomy Contributing factors, OSA, COPD, CHF, obesity hypoventilation syndrome  Fever since 48 hrs- post tracheostomy- no hematoma  Leucocytosis has improved a lot D.D Tongue injury VS  empyema/ sinusitis PICC in place- site is clean Foley in place- UA sent RESp viral PCR sent MRSA nares sent Recommend CT chest if fever persist Pt has already been started on zosyn Assess how he does for 24 hrs If fever persist and if antibiotic coverage has to be expanded then will need blood culture before that  Pneumonia - Strep pneumo/hinfluenza in tracheal aspirate has been adequately treated. If fever persist CT chest to look for empyema  Anemia ? __AKI- nephrology on board Hypernatremia Hyperkalemia  Anasarca  _Chronic systolic heart failure- cardiology following ________________________________________________ Discussed with ICU team [facility] will cover Friday/Saturday/Sunday Available by phone for urgent issues Note:  This document was prepared using Dragon voice recognition software and may include unintentional dictation errors.

## 2022-02-27 NOTE — Consult Note (Addendum)
Cardiology Consultation:   Patient ID: Samuel Chavez MRN: [id number]; DOB: [birth year]  Admit date: 02/11/2022 Date of Consult: 02/27/2022  PCP:  [facility] [place] [facility] Cardiologist:  None   {  Patient Profile:   Samuel Chavez is a 46 y.o. male with a hx of DM2, HTN, pancreatitis, ongoing tobacco use, chronic systolic and diastolic CHF with EF as low as 30-35% in the past, OSA noncompliant with CPAP, h/o right-sided Bells Palsy, lumbar radiculopathy, morbid obesity, noncompliance who is being seen 02/27/2022 for the evaluation of heart failure at the request of Dr. Mortimer Fries.  History of Present Illness:   Samuel Chavez is followed by cardiologist in [facility].   He was hospitalized multiple times throughout the years with elevated BP, acute heart failure and medication noncompliance. According to chart review. EF January 2021 was 35%. In June 2021 EF 45-50%. In November 2021 LVEF 45-50%. Unclear whether he has had an ischemic work-up in the past.    Hospitalization in June for heart failure, anasarca/volume overload with non-adherence to medications. LVEF found to be 30-35% and was noted to be in hypertensive emergency which improved with restarting home meds. He was diuresed with a lasix drip. The patient left AMA despite needing further diuresis.   He was admitted to the hospital 7/25 with acute on chronic hypoxic and hypercapnic respiratory failure requiring bipap. Initial vitals BP 158/141, HR 91bpm, O2 81%, Labs showed VBG CO2 87, HCO3 40, CMP showed BG 319, CO2 34, Chl 94, calcium 8.8. CXR showed vascular congestion without overt pulmonary edema. He failed bipap and was eventually intubated and moved to CCM. Being treated for acute CHF, persistent hypoxia, CAD, and AKI on CKD stage 4. Still on full vent support.   Echo 7/25 showed LVEF 55-60% Echo 7/30 showed LVEF 40-45%  Recent labs showed sodium 154, K 5.3, Chl119, Scr 1.65, BUN 81, Mag 2.8 CRP 19.7,  WBC 12.5, Hgb 9.1  Past Medical History:  Diagnosis Date   Diabetes mellitus without complication ([place])    Hypertension    Pancreatitis     Past Surgical History:  Procedure Laterality Date   CHOLECYSTECTOMY     TRACHEOSTOMY TUBE PLACEMENT N/A 02/25/2022   Procedure: TRACHEOSTOMY;  Surgeon: Clyde Canterbury, MD;  Location: [facility] ORS;  Service: ENT;  Laterality: N/A;     Home Medications:  Prior to Admission medications   Medication Sig Start Date End Date Taking? Authorizing Provider  losartan (COZAAR) 100 MG tablet Take 100 mg by mouth daily. 02/01/22  Yes [provider]  OZEMPIC, 0.25 OR 0.5 MG/DOSE, 2 MG/3ML SOPN SMARTSIG:0.25 Milligram(s) SUB-Q Once a Week 12/09/21  Yes [provider]  torsemide (DEMADEX) 20 MG tablet Take 20 mg by mouth daily. 12/18/21  Yes [provider]  Aspirin-Salicylamide-Caffeine (BC HEADACHE POWDER PO) Take 1 packet by mouth as needed (for pain).    [provider]  cephALEXin (KEFLEX) 500 MG capsule Take 1 capsule (500 mg total) by mouth 4 (four) times daily. Patient not taking: Reported on 02/11/2022 12/16/18   Davonna Belling, MD  cyclobenzaprine (FLEXERIL) 10 MG tablet Take 0.5-1 tablets (5-10 mg total) by mouth 2 (two) times daily as needed for muscle spasms. Patient not taking: Reported on 02/11/2022 03/12/18   Margarita Mail, PA-C  gabapentin (NEURONTIN) 600 MG tablet Take 600 mg by mouth at bedtime. Patient not taking: Reported on 02/11/2022 10/13/21   [provider]  LABETALOL HCL PO Take by mouth. Patient not taking:  Reported on 02/11/2022    [provider]  meloxicam (MOBIC) 15 MG tablet Take 1 tablet (15 mg total) by mouth daily. Take 1 daily with food. Patient not taking: Reported on 02/11/2022 03/12/18   Margarita Mail, PA-C  metFORMIN (GLUCOPHAGE) 500 MG tablet Take 500 mg by mouth 2 (two) times daily with a meal. Patient not taking: Reported on 02/11/2022    [provider]  METFORMIN  HCL ER, MOD, PO Take by mouth. Patient not taking: Reported on 02/11/2022    [provider]  METOPROLOL SUCCINATE ER PO Take by mouth. Patient not taking: Reported on 02/11/2022    [provider]  oxyCODONE-acetaminophen (PERCOCET) 10-325 MG tablet Take 1 tablet by mouth See admin instructions. Take 1 tablet by mouth 4-5 times a day as needed for pain Patient not taking: Reported on 02/11/2022    [provider]  oxyCODONE-acetaminophen (PERCOCET/ROXICET) 5-325 MG tablet Take 1 tablet by mouth every 8 (eight) hours as needed for severe pain. Patient not taking: Reported on 02/11/2022 12/16/18   Davonna Belling, MD  predniSONE (DELTASONE) 20 MG tablet Take 2 tablets (40 mg total) by mouth daily. Patient not taking: Reported on 02/11/2022 04/04/15   Davonna Belling, MD    Inpatient Medications: Scheduled Meds:  artificial tears  1 Application Both Eyes H0Q   Chlorhexidine Gluconate Cloth  6 each Topical Q0600   clonazepam  2 mg Per Tube BID   feeding supplement (PROSource TF)  90 mL Per Tube TID   free water  200 mL Per Tube Q4H   heparin injection (subcutaneous)  5,000 Units Subcutaneous Q8H   insulin starter kit- pen needles  1 kit Other Once   ipratropium-albuterol  3 mL Nebulization Q6H   living well with diabetes book   Does not apply Once   mouth rinse  15 mL Mouth Rinse Q2H   oxyCODONE  10 mg Per Tube Q6H   pantoprazole sodium  40 mg Per Tube QHS   patiromer  16.8 g Oral Daily   polyethylene glycol  17 g Per Tube Daily   senna-docusate  1 tablet Per Tube BID   sodium chloride flush  10-40 mL Intracatheter Q12H   Continuous Infusions:  sodium chloride Stopped (02/11/22 2011)   dextrose 100 mL/hr at 02/27/22 1048   feeding supplement (VITAL 1.5 CAL) 70 mL/hr at 02/27/22 1048   fentaNYL infusion INTRAVENOUS 100 mcg/hr (02/27/22 1048)   insulin 14 Units/hr (02/27/22 1048)   midazolam 2 mg/hr (02/27/22 1048)   piperacillin-tazobactam (ZOSYN)  IV      PRN Meds: acetaminophen **OR** acetaminophen, dextrose, docusate sodium, fentaNYL, midazolam, ondansetron **OR** ondansetron (ZOFRAN) IV, mouth rinse, polyethylene glycol, sodium chloride flush, traZODone  Allergies:    Allergies  Allergen Reactions   Hydrocodone Swelling    Social History:   Social History   Socioeconomic History   Marital status: Single    Spouse name: Not on file   Number of children: Not on file   Years of education: Not on file   Highest education level: Not on file  Occupational History   Not on file  Tobacco Use   Smoking status: Every Day    Packs/day: 1.00    Types: Cigarettes   Smokeless tobacco: Never  Substance and Sexual Activity   Alcohol use: No   Drug use: No   Sexual activity: Not on file  Other Topics Concern   Not on file  Social History Narrative   Not on file  Social Determinants of Health   Financial Resource Strain: Not on file  Food Insecurity: Not on file  Transportation Needs: Not on file  Physical Activity: Not on file  Stress: Not on file  Social Connections: Not on file  Intimate Partner Violence: Not on file    Family History:   History reviewed. No pertinent family history.   ROS:  Please see the history of present illness.   All other ROS reviewed and negative.     Physical Exam/Data:   Vitals:   02/27/22 0600 02/27/22 0700 02/27/22 0800 02/27/22 0850  BP: 123/61 (!) 123/59 (!) 122/56   Pulse: (!) 107 (!) 108 (!) 108 (!) 108  Resp: (!) 31 (!) 26 (!) 32 (!) 30  Temp:      TempSrc:      SpO2: 95% 95% 95% 96%  Weight:      Height:        Intake/Output Summary (Last 24 hours) at 02/27/2022 1119 Last data filed at 02/27/2022 1048 Gross per 24 hour  Intake 5047.53 ml  Output 2800 ml  Net 2247.53 ml      02/27/2022    4:15 AM 02/26/2022    5:00 AM 02/24/2022    5:00 AM  Last 3 Weights  Weight (lbs) 437 lb 9.8 oz 438 lb 11.5 oz 453 lb 14.8 oz  Weight (kg) 198.5 kg 199 kg 205.9 kg     Body mass  index is 64.59 kg/m.  General: mechanically ventilated and sedated HEENT: normal Neck: no JVD Vascular: No carotid bruits; Distal pulses 2+ bilaterally Cardiac:  normal S1, S2; RRR; no murmur  Lungs:  clear to auscultation bilaterally, no wheezing, rhonchi or rales  Abd: soft, nontender, no hepatomegaly  Ext: pedal and lower extremity edema Musculoskeletal:  No deformities, BUE and BLE strength normal and equal Skin: warm and dry  Neuro: sedated Psych:  sedated    Telemetry:  Telemetry was personally reviewed and demonstrates:  Sinus tachycardia 100-110  Relevant CV Studies:  Echo 02/16/22 1. Left ventricular ejection fraction, by estimation, is 40 to 45%. The  left ventricle has mildly decreased function. The left ventricle has no  regional wall motion abnormalities. The left ventricular internal cavity  size was moderately dilated. There  is mild concentric left ventricular hypertrophy.   2. Right ventricular systolic function is normal. The right ventricular  size is moderately enlarged.   3. Left atrial size was moderately dilated.   4. Right atrial size was moderately dilated.   5. The mitral valve is normal in structure. Trivial mitral valve  regurgitation. No evidence of mitral stenosis.   6. The aortic valve is normal in structure. Aortic valve regurgitation is  not visualized. Aortic valve sclerosis is present, with no evidence of  aortic valve stenosis.   7. The inferior vena cava is normal in size with greater than 50%  respiratory variability, suggesting right atrial pressure of 3 mmHg.   Echo 02/11/22  1. Left ventricular ejection fraction, by estimation, is 55 to 60%. The  left ventricle has normal function. Left ventricular endocardial border  not optimally defined to evaluate regional wall motion. The left  ventricular internal cavity size was mildly  dilated. There is moderate left ventricular hypertrophy. Left ventricular  diastolic parameters are  consistent with Grade I diastolic dysfunction  (impaired relaxation).   2. Right ventricular systolic function is normal. The right ventricular  size is mildly enlarged. Tricuspid regurgitation signal is inadequate for  assessing PA pressure.  3. Left atrial size was mild to moderately dilated.   4. Right atrial size was moderately dilated.   5. The mitral valve is abnormal. Trivial mitral valve regurgitation. No  evidence of mitral stenosis.   6. The aortic valve has an indeterminant number of cusps. Aortic valve  regurgitation is not visualized. No aortic stenosis is present.   7. Aortic dilatation noted. There is borderline dilatation of the aortic  root, measuring 39 mm. There is mild dilatation of the ascending aorta,  measuring 39 mm.     The left ventricular size is normal. There is severe concentric left ventricular hypertrophy. There is severe global hypokinesis of the left ventricle. Left ventricular systolic function is severely reduced. LV ejection fraction 30-35%. Grade II diastolic dysfunction. Left ventricular filling pattern is pseudonormal. Left atrial pressure [LAP] is elevated. The right ventricle is mildly dilated. RV systolic function looks moderately reduced, but TAPSE values are normal. The left atrium is severely dilated. The right atrium is moderately dilated. There is mild to moderate mitral regurgitation. There is mild tricuspid regurgitation. IVC size was moderately dilated. Moderate pulmonary hypertension. Estimated right ventricular systolic pressure is 56 mmHg.  Nov 2021: SUMMARY Left ventricular systolic function is low normal. LV ejection fraction = 45-50%. There is moderate concentric left ventricular hypertrophy. The left atrium is moderately dilated. There is no significant valvular stenosis or regurgitation. There is no pericardial effusion. There is no significant change in comparison with the last study.  June 2021: EF 45-50% SUMMARY  biatrial enlargement Mild LVE, moderate LVH, mild global LV hypokinesis, EF 40-45% mild MR since prior study, LV function has improved  Jan 2021 EF 35% Summary Mildly dilated left ventricle with mild to moderate global reduced systolic function. Ejection fraction is visually estimated at 35-40% Moderate concentric left ventricular hypertrophy No significant valvular abnormalities. LV Function has decreased vs. previous echo report Signature  Laboratory Data:  High Sensitivity Troponin:   Recent Labs  Lab 02/11/22 2218 02/12/22 0051  TROPONINIHS 24* 22*     Chemistry Recent Labs  Lab 02/25/22 0309 02/25/22 1107 02/25/22 1542 02/26/22 0502 02/27/22 0416  NA 150*   < > 152* 154* 154*  K 4.7   < > 4.3 5.0 5.3*  CL 118*   < > 121* 121* 119*  CO2 27   < > _0 GLUCOSE 179*   < > 123* 209* 177*  BUN 96*   < > 91* 89* 81*  CREATININE 1.72*   < > 1.53* 1.61* 1.65*  CALCIUM 8.1*   < > 7.6* 8.1* 7.9*  MG 2.8*  --   --  2.8* 2.8*  GFRNONAA 50*   < > 57* 54* 52*  ANIONGAP 5   < > 4* 4* 7   < > = values in this interval not displayed.    No results for input(s): "PROT", "ALBUMIN", "AST", "ALT", "ALKPHOS", "BILITOT" in the last 168 hours. Lipids No results for input(s): "CHOL", "TRIG", "HDL", "LABVLDL", "LDLCALC", "CHOLHDL" in the last 168 hours.  Hematology Recent Labs  Lab 02/25/22 0309 02/26/22 0502 02/27/22 0416  WBC 16.2* 12.4* 12.5*  RBC 3.66* 3.54* 3.41*  HGB 9.6* 9.5* 9.1*  HCT 34.6* 34.0* 33.0*  MCV 94.5 96.0 96.8  MCH 26.2 26.8 26.7  MCHC 27.7* 27.9* 27.6*  RDW 15.4 15.5 15.5  PLT 227 227 227   Thyroid No results for input(s): "TSH", "FREET4" in the last 168 hours.  BNPNo results for input(s): "BNP", "PROBNP" in the  last 168 hours.  DDimer No results for input(s): "DDIMER" in the last 168 hours.   Radiology/Studies:  DG Chest Port 1 View  Result Date: 02/26/2022 CLINICAL DATA:  Hypoxic respiratory failure. EXAM: PORTABLE CHEST 1 VIEW COMPARISON:   Portable chest 02/23/2022 FINDINGS: 5:06 a.m. Interval tracheostomy cannula insertion with the tip 7 cm from the carina. Right PICC tip is in the distal SVC. Feeding tube passing well into the stomach with the radiopaque tip not filmed. Moderate to severe cardiomegaly. There is still mild perihilar vascular congestion but this does show improvement. Interstitial edema has regressed from the upper zones with mild interstitial edema remaining in the bases. There is persistent patchy consolidation in the lower lung fields and small pleural effusions, both findings with interval improvement. The upper lung fields are essentially clear today. IMPRESSION: 1. Improvement in perihilar vascular congestion and interstitial edema. 2. Improvement in bilateral lung opacities with clearance of the upper lung zones and persistent but improved patchy opacities in the lower zones. 3. Small pleural effusions with improvement. 4. Interval tracheostomy cannula insertion, the tip 7 cm from carina. 5. NGT has been exchanged for a feeding tube which is well into the stomach but the radiopaque tip is not included today. Electronically Signed   By: Telford Nab M.D.   On: 02/26/2022 06:23   DG Abd 1 View  Result Date: 02/25/2022 CLINICAL DATA:  NG tube placement EXAM: ABDOMEN - 1 VIEW COMPARISON:  02/18/2022 FINDINGS: Tip of feeding tube is seen in the region of the antrum of the stomach. Bowel gas pattern in the visualized upper portions of abdomen is unremarkable. Lower abdomen, right side of abdomen and pelvis are not included in the image. Transverse diameter heart is increased IMPRESSION: Tip of NG tube is seen in the antrum of the stomach. Electronically Signed   By: Elmer Picker M.D.   On: 02/25/2022 12:00   DG Chest Port 1 View  Result Date: 02/23/2022 CLINICAL DATA:  [id number]; intubation EXAM: PORTABLE CHEST 1 VIEW COMPARISON:  Radiograph dated August 2,4, 2023 FINDINGS: Evaluation is limited by underpenetration,  patient rotation and extensive grid line artifact. The cardiomediastinal silhouette is unchanged and enlarged in contour.ETT tip terminates 6.5 cm above the carina. Enteric tube courses past the carina; tip is not well visualized secondary to poor penetration. It is favored to be below the diaphragm. RIGHT upper extremity PICC tip is estimated to terminate over the superior cavoatrial junction. No large pleural effusion. No large pneumothorax. Diffuse bilateral airspace opacities, grossly similar comparison to prior given suboptimal technique. These are increased in the RIGHT lung in comparison to August 2nd with a persistent LEFT retrocardiac opacity in the LEFT lung and mildly improved aeration of the LEFT upper lung compared to August 2nd. IMPRESSION: Limited examination. Support apparatus as described above. Diffuse bilateral airspace opacities, favored to be similar comparison to most recent prior but overall worsened in the RIGHT lung since August 2nd. Electronically Signed   By: Valentino Saxon M.D.   On: 02/23/2022 12:37     Assessment and Plan:   Acute systolic and diastolic heart failure - initial Echo 7/25 showed LVEF 55-60%, G1DD - repeat Echo 7/30 showed LVEF 40-45%, mild LVH, moderately dialted atrium, trivial MR - prior echocardiograms showed LVEF down to 30-35% in the past. Does not appear he has undergone ischemic evaluation.  - history of noncompliance with medications - he was hospitalized in June 2023 with similar issues. Sent home on Hydralazine 98mTID, Imdur 338mdaily,  Toprol 119m daily, spironolactone 227mdaily, lisinopril 4065maily, torsemide 82m74mD - LVEF reduced in the setting of severe hypoxia/hypercapnic respiratory failure and CAP -  still intubated and sedated - he received IV lasix intermittently through admission, Iv lasix 40mg35m8/8. Has been limited by kidney function and BP - off vasopressors - On admission Scr 1.32, BUN 14. It has been as high as 3.94.  today Scr 1.65/BUN 81. Nephrology is following - difficult to assess volume given body habitus. Pedal edema noted.  - add back GDMT as able and re-check an echo in the future. Consider addition to BB given elevated HR. Not the best cath candidate given medication noncompliance and kidney function   Acute on chronic hypoxic and hypercapnic respiratory failure CAP with persistent hypoxia - s/p tracheostomy - vent support per CCM - febrile - abx per CCM  AKI on CKD stage 4 - suspected 2/2 hypotension, fever and sepsis - Scr/BUN overall stable - nephrology following  Tobacco use - he smokes 1 ppd  For questions or updates, please contact [facility] Please consult [email or web address] for contact info under    Signed, Ivor Kishi H FurNinfa MeekerC  02/27/2022 11:19 AM

## 2022-02-27 NOTE — Progress Notes (Signed)
Recruitment maneuver completed  PC 30, 10RR, 3s Ti, 100% FiO2, Peep 14 for 2 minutes, pt desat to mid 80's with slow recovery.

## 2022-02-27 NOTE — Consult Note (Signed)
Samuel Chavez, Samuel Chavez [id number] [birth year] Flora Lipps, MD  Reason for Consult: fever, sinusitis  HPI: 46 year old male admitted for respiratory failure s/p tracheostomy POD#1 with fever.  Patient had previously completed course of antibiotics for H flu and was started on Zosyn today.  He has had significant secretions from nasal cavity for several days.  Patient was also noted to have tongue swelling and found to have laceration to inferior aspect of his tongue at time of tracheostomy.  Fever has been present for several days but worsened yesterday following tracheostomy.  Allergies:  Allergies  Allergen Reactions   Hydrocodone Swelling    ROS: Review of systems normal other than 12 systems except per HPI.  PMH:  Past Medical History:  Diagnosis Date   Diabetes mellitus without complication ([place])    Hypertension    Pancreatitis     FH: History reviewed. No pertinent family history.  SH:  Social History   Socioeconomic History   Marital status: Single    Spouse name: Not on file   Number of children: Not on file   Years of education: Not on file   Highest education level: Not on file  Occupational History   Not on file  Tobacco Use   Smoking status: Every Day    Packs/day: 1.00    Types: Cigarettes   Smokeless tobacco: Never  Substance and Sexual Activity   Alcohol use: No   Drug use: No   Sexual activity: Not on file  Other Topics Concern   Not on file  Social History Narrative   Not on file   Social Determinants of Health   Financial Resource Strain: Not on file  Food Insecurity: Not on file  Transportation Needs: Not on file  Physical Activity: Not on file  Stress: Not on file  Social Connections: Not on file  Intimate Partner Violence: Not on file    PSH:  Past Surgical History:  Procedure Laterality Date   CHOLECYSTECTOMY     TRACHEOSTOMY TUBE PLACEMENT N/A 02/25/2022   Procedure: TRACHEOSTOMY;  Surgeon: Clyde Canterbury, MD;  Location: [facility] ORS;   Service: ENT;  Laterality: N/A;    Physical  Exam:  GEN- obese male sedated and intubated EARS-  dull TMs with fluid but no erythema or edema NOSE- purulent drainage bilaterally, right doboff tube in place with septal holder OC/OP- protrusion of tongue externally with edema and erythema, inferiorly with laceration from mandibular teeth NECK-  trach in place and secure and dry RESP-  ventilator CARD-  regular EXT-  edematous  Procedure:  Diagnostic Nasal Endoscopy:  After topical phenylephrine and lidocaine was placed into the patient's nasal cavity bilaterally.  Significant purulent drainage was suctioned bilaterally.  A sample was sent for culture from right nostril.  A flexible endoscope was inserted into the patient's right nasal cavity.  This demonstrated seroupurulent secretions but mostly centered around the doboff tube in the nasal cavity.  These were suctioned showing mild edema and erythema of mucosa.  No necrosis or palor or eschar.  This was repeated on the patient's left side with again purulent secretions that were suctioned and some mucosa edema in the left middle meatus which was not present on right side.  No necrotic tissue or eschar was noted.  CT 02/21/2022-  Bilateral sinusitis and edema in all sinus cavities.  Fluid in mastoid and middle ear.  No aggressive bone destruction   A/P: Sinusitis and serous otitis media, tongue edema and laceration  Plan:  Will plan  on repeat endoscopy tomorrow to evaluate left sinus again as some edema in middle meatus but no evidence of mucor on endoscopy.  Culture taken and will follow gram stain.  Discussed tongue laceration with Dr. Mortimer Fries who came by and manipulated patient's tongue back into his oral cavity.  Recommend peridex for cleaning inferior ventral laceration.   Jeannie Fend Kirin Brandenburger 02/27/2022 4:54 PM

## 2022-02-27 NOTE — Progress Notes (Addendum)
NAME:  Samuel Chavez, MRN:  [id number], DOB:  Aug 28, 1975, LOS: 72 ADMISSION DATE:  02/11/2022, CONSULTATION DATE: 02/11/22 REFERRING MD:  Eugenie Norrie MD  CHIEF COMPLAINT: SOB   CC  follow up RESP FAILURE/acute CHF exacerbation  HPI/SYNOPSIS  46 y.o male with significant PMH as below who presented to the ED with chief complaints of SOB and worsening bilateral lower extremities edema.  ED Course: In the emergency department, the temperature was 37.3C, the heart rate 99 beats/minute, the blood pressure 187/102 mm Hg, the respiratory rate 20 breaths/minute, and the oxygen saturation 89% on. He was noted to be drowsy but still protecting his airway and responding appropriately.  Pertinent INITIAL Labs/Diagnostics Findings: Chemistry:Glucose:319 Calcium: 8.8 otherwise unremarkable CBC: unremarkable Other Lab findings: ABG with hypoxemia and hypercapnia, .  Imaging: Chest x-ray showed bilateral infiltrates worse on right    Significant Hospital Events   7/25: Admitted to hospitalist service w/acute on chronic hypoxic hypercapnic resp. failure requiring BiPAP.Failed BiPAP and intubated. PCCM consulted 7/26: INTUBATED, difficlut to sedate, started [place] 7/27: severe hypoxia, PEEP increased to 15 but decreased back to 10 7/28: Persistent severe hypoxia, ventilator changes made 7/29: Persistent hypoxia, recruitment maneuvers instituted, ventilator changes made, empiric heparin as patient cannot be scanned query PE -02/17/22- patient is severely critically ill with bilateral multifocal pneumonia on sedation and paralysis with mechanical ventilation maximal settings.  He is at cusp of death, we met with daughter today and discussed severity of illness. Unable to perform CT due to severity of critical illness. Met with previous PCCM doc and discussed medical plan.  Patient had recruitment maneuvers last few days without improvement, on heparin gtt for possible PE.   02/18/22- patient  continues to require maximal settings on ventilator despite good UOP.  He destaturated to <50% spO2 on MV today required BGV.   02/19/22- patient continues to require 100% FiO2 on maximal setting with high PEEP ladder for possible ARDS.  He was diuresed and on steroids with development of AKI.  His CXR had slight interval improvement on right. We discussed case with vascular surgery regarding possible empiric tPA for PE.  02/20/22- patient was unable to get CT today due to continued severe hypoxemia.  Daughter and other family members at bedside we reviewed case and medical plan.  There seems to be some confusion from family as they had asked me to wake patient up and take off ventilator even though I had explained multiple times that he is at very high risk for death.  They laughed at my comments and seemed to think it was not true. We may still be able to do bronchoscopy but its high risk.  We have reduced IV infusions by switching some medications to OGT route.  02/21/22- patient is weaned to 60%FiO2.  Plan for possible bronch if patient is tolerating.  Family at bedside.  If able to we will obtain more imaging and VQ scan to rule out PE.  02/22/22- Patient weaned to 50% on PRVC.  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 CBC stable , CMP with improved GFR. Monitoring Na, pharmacy and renal following for electrolytes/renal function.  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  Overall marked improvement on ventilator over past 48h. Met with daughter at bedside reviewed medical plan.  02/24/22-Vent requirements continue to slowly improve, currently 40% FiO2 and 14 PEEP.  AKI continues to slowly improve, remains  hyperkalemic with potassium of 5.8.  Placed back on Veltassa, Diurese x1.  Trach scheduled for tomorrow at 1:15 PM 02/25/22- Diurese with Lasix x1 dose. Trach scheduled for today. 02/26/22: Remains mechanically  ventilated via newly placed tracheostomy will attempt to wean ventilator settings today.  Perform WUA  02/26/22: Pt remains mechanically ventilated via tracheostomy vent settings: PEEP 14/FiO2 80%  Consults:  PCCM Nephrology Vascular Surgery Palliative Care ENT  Procedures:  7/25: Intubation 7/26: PICC triple-lumen, right basilic vein 3/53: Left radial arterial line  8/08: Size 8 proximal XLT Shiley inserted   Significant Diagnostic Tests:  7/25  Chest Xray:Chest x-ray showed cardiomegaly and pulmonary vascular congestion without overt pulmonary edema 7/25 Echocardiogram: difficult study, LVEF was estimated at 55 to 60%, dilated LV moderate LVH, grade 1 DD, enlargement of the right ventricle, left atrial size moderately dilated, right atrial size moderately dilated this is consistent with restrictive physiology (echo reading not congruous with prior echoes from [facility]) 7/29: Venous US BLE>>IMPRESSION: No lower extremity DVT. 7/30 Limited echo: LVEF 40 to 45% decreased LV function, no wall motion abnormalities, moderate dilation of the LV concentric LVH, right ventricular size moderately enlarged, right atrial enlargement, this is more consistent with prior [facility] scans 8/4: CT Head>>No acute intracranial abnormality. Paranasal sinusitis. Bilateral complete opacification of the mastoid air cells and inner ears, as can be seen in the setting of otitis media. Correlate with symptoms. 8/4: CT Chest/Abdomen/Pelvis>>Extensive ground-glass, alveolar and patchy dense infiltrates in both lungs suggesting multifocal pneumonia. Part of this finding may suggest underlying pulmonary edema. Small bilateral pleural effusions are seen. Cardiomegaly. There is ectasia of the main pulmonary artery suggesting pulmonary arterial hypertension. There is no evidence of intestinal obstruction or pneumoperitoneum. There is no hydronephrosis. Appendix is not dilated. UB diverticula are seen in  the colon without signs of focal diverticulitis. Severe degenerative changes are noted at L4-L5 level with significant interval worsening. Findings may be due to severe disc degeneration. If there is clinical suspicion for discitis or osteomyelitis, follow-up MRI may be considered 8/4: Lung V/Q>>Pulmonary embolism absent.  Micro Data:  7/25: SARS-CoV-2 PCR> negative 7/25: MRSA PCR>> NEG 7/27: Strep pneumoniae Ag>> negative 7/27: Legionella Ag>> negative 7/27: Mycoplasma>> <770 7/27: Sputum>> Strep pneumo,Haemophilus influenzae 7/30 Sputum cult >> negative 8/4: Tracheal aspirate>>negative 8/9: Tracheal aspirate>>  Antimicrobials:  Cefepime 7/27 >>7/31 Ceftriaxone 7/31>>8/4 Vancomycin 7/27 >> 7/29, restarted 7/30 (resumed due to fever spike on Maxipime) ? Resistant Strep pneumo >> 7/31  REVIEW OF SYSTEMS Patient is unable to provide complete review of systems due to severe critical illness/ventilator dependence  OBJECTIVE  Blood pressure 123/61, pulse (!) 107, temperature (!) 101 F (38.3 C), temperature source Oral, resp. rate (!) 31, height 5' 9.02" (1.753 m), weight (!) 198.5 kg, SpO2 95 %. CVP:  [16 mmHg-24 mmHg] 18 mmHg   Vent Mode: PRVC FiO2 (%):  [80 %-85 %] 80 % Set Rate:  [30 bmp] 30 bmp Vt Set:  [500 mL] 500 mL PEEP:  [14 cmH20] 14 cmH20 Plateau Pressure:  [27 cmH20-33 cmH20] 27 cmH20   Intake/Output Summary (Last 24 hours) at 02/27/2022 0731 Last data filed at 02/27/2022 2992 Gross per 24 hour  Intake 10259.37 ml  Output 3900 ml  Net 6359.37 ml   Filed Weights   02/24/22 0500 02/26/22 0500 02/27/22 0415  Weight: (!) 205.9 kg (!) 199 kg (!) 198.5 kg   PHYSICAL EXAMINATION: GENERAL: Acute on chronically ill appearing, Morbidly obese male, intubated, mechanically ventilated, sedated, in NAD HEAD: Normocephalic,  atraumatic. EYES: Pupils equal, round, reactive to light.  No scleral icterus.  MOUTH: Macroglossia (massive). NG in place. NECK: Supple. No  thyromegaly. Size 8 XLT midline. Difficult to assess JVD due to body habitus. PULMONARY: Faint rhonchi throughout, even, non labored  CARDIOVASCULAR: NSR, rrr, no M/R/G, 2+ radial/2+ distal pulses, trace generalized edema  ABDOMEN: +BS x4, soft, obese, non distended  MUSCULOSKELETAL: Normal bulk and tone NEUROLOGIC: Sedated, withdraws from pain, opens eyes to voice, pupils PERRL (sluggish 2 mm bilaterally) SKIN: Intact,warm,dry.  Chronic stasis changes in the lower extremities. PSYCH: Not assessed due to mechanically ventilated status.  Labs/imaging that I havepersonally reviewed    Labs   CBC: Recent Labs  Lab 02/23/22 0317 02/24/22 0313 02/25/22 0309 02/26/22 0502 02/27/22 0416  WBC 16.0* 15.6* 16.2* 12.4* 12.5*  NEUTROABS 13.3* 13.7* 13.2* 10.4* 10.6*  HGB 9.4* 9.4* 9.6* 9.5* 9.1*  HCT 33.8* 34.1* 34.6* 34.0* 33.0*  MCV 95.8 94.5 94.5 96.0 96.8  PLT 180 193 227 227 354    Basic Metabolic Panel: Recent Labs  Lab 02/23/22 0317 02/23/22 2004 02/24/22 0313 02/24/22 1709 02/25/22 0309 02/25/22 1107 02/25/22 1542 02/26/22 0502 02/27/22 0416  NA 159*   < > 155*   < > 150* 140 152* 154* 154*  K 4.9  --  5.8*   < > 4.7 4.0 4.3 5.0 5.3*  CL 125*  --  122*   < > 118* 109 121* 121* 119*  CO2 28  --  26   < > _0 GLUCOSE 175*  --  300*   < > 179* 483* 123* 209* 177*  BUN 129*  --  117*   < > 96* 89* 91* 89* 81*  CREATININE 2.27*  --  1.87*   < > 1.72* 1.45* 1.53* 1.61* 1.65*  CALCIUM 8.3*  --  8.2*   < > 8.1* 7.1* 7.6* 8.1* 7.9*  MG 3.0*  --  3.4*  --  2.8*  --   --  2.8* 2.8*  PHOS 4.6  --  4.8*  --  4.4  --   --  5.2* 5.3*   < > = values in this interval not displayed.   GFR: Estimated Creatinine Clearance: 98.4 mL/min (A) (by C-G formula based on SCr of 1.65 mg/dL (H)). Recent Labs  Lab 02/24/22 0313 02/25/22 0309 02/26/22 0502 02/27/22 0416  WBC 15.6* 16.2* 12.4* 12.5*    Liver Function Tests: No results for input(s): "AST", "ALT", "ALKPHOS",  "BILITOT", "PROT", "ALBUMIN" in the last 168 hours.  ABG    Component Value Date/Time   PHART 7.28 (L) 02/27/2022 0500   PCO2ART 61 (H) 02/27/2022 0500   PO2ART 88 02/27/2022 0500   HCO3 28.7 (H) 02/27/2022 0500   TCO2 26 03/17/2007 1822   O2SAT 97.6 02/27/2022 0500     Home Medications  Prior to Admission medications   Medication Sig Start Date End Date Taking? Authorizing Provider  losartan (COZAAR) 100 MG tablet Take 100 mg by mouth daily. 02/01/22  Yes [provider]  OZEMPIC, 0.25 OR 0.5 MG/DOSE, 2 MG/3ML SOPN SMARTSIG:0.25 Milligram(s) SUB-Q Once a Week 12/09/21  Yes [provider]  torsemide (DEMADEX) 20 MG tablet Take 20 mg by mouth daily. 12/18/21  Yes [provider]  Aspirin-Salicylamide-Caffeine (BC HEADACHE POWDER PO) Take 1 packet by mouth as needed (for pain).    [provider]  cephALEXin (KEFLEX) 500 MG capsule Take 1 capsule (500 mg total) by mouth 4 (four)  times daily. Patient not taking: Reported on 02/11/2022 12/16/18   Davonna Belling, MD  cyclobenzaprine (FLEXERIL) 10 MG tablet Take 0.5-1 tablets (5-10 mg total) by mouth 2 (two) times daily as needed for muscle spasms. Patient not taking: Reported on 02/11/2022 03/12/18   Margarita Mail, PA-C  gabapentin (NEURONTIN) 600 MG tablet Take 600 mg by mouth at bedtime. Patient not taking: Reported on 02/11/2022 10/13/21   [provider]  LABETALOL HCL PO Take by mouth. Patient not taking: Reported on 02/11/2022    [provider]  meloxicam (MOBIC) 15 MG tablet Take 1 tablet (15 mg total) by mouth daily. Take 1 daily with food. Patient not taking: Reported on 02/11/2022 03/12/18   Margarita Mail, PA-C  metFORMIN (GLUCOPHAGE) 500 MG tablet Take 500 mg by mouth 2 (two) times daily with a meal. Patient not taking: Reported on 02/11/2022    [provider]  METFORMIN HCL ER, MOD, PO Take by mouth. Patient not taking: Reported on 02/11/2022    [provider]  METOPROLOL SUCCINATE ER PO Take by mouth. Patient not taking: Reported on 02/11/2022    [provider]  oxyCODONE-acetaminophen (PERCOCET) 10-325 MG tablet Take 1 tablet by mouth See admin instructions. Take 1 tablet by mouth 4-5 times a day as needed for pain Patient not taking: Reported on 02/11/2022    [provider]  oxyCODONE-acetaminophen (PERCOCET/ROXICET) 5-325 MG tablet Take 1 tablet by mouth every 8 (eight) hours as needed for severe pain. Patient not taking: Reported on 02/11/2022 12/16/18   Davonna Belling, MD  predniSONE (DELTASONE) 20 MG tablet Take 2 tablets (40 mg total) by mouth daily. Patient not taking: Reported on 02/11/2022 04/04/15   Davonna Belling, MD   Hospital Scheduled Meds:  artificial tears  1 Application Both Eyes H0T   Chlorhexidine Gluconate Cloth  6 each Topical Q0600   clonazepam  2 mg Per Tube BID   feeding supplement (PROSource TF)  90 mL Per Tube TID   free water  200 mL Per Tube Q4H   heparin injection (subcutaneous)  5,000 Units Subcutaneous Q8H   insulin starter kit- pen needles  1 kit Other Once   ipratropium-albuterol  3 mL Nebulization Q6H   living well with diabetes book   Does not apply Once   mouth rinse  15 mL Mouth Rinse Q2H   oxyCODONE  10 mg Per Tube Q6H   pantoprazole sodium  40 mg Per Tube QHS   patiromer  16.8 g Oral Daily   polyethylene glycol  17 g Per Tube Daily   senna-docusate  1 tablet Per Tube BID   sodium chloride flush  10-40 mL Intracatheter Q12H   Continuous Infusions:  sodium chloride Stopped (02/11/22 2011)   dextrose 100 mL/hr at 02/27/22 0600   feeding supplement (VITAL 1.5 CAL) 70 mL/hr at 02/27/22 0600   fentaNYL infusion INTRAVENOUS 150 mcg/hr (02/27/22 0600)   insulin 13 Units/hr (02/27/22 0600)   midazolam 3 mg/hr (02/27/22 0600)   PRN Meds:.acetaminophen **OR** acetaminophen, dextrose, docusate sodium, fentaNYL, midazolam, ondansetron **OR** ondansetron (ZOFRAN) IV, mouth rinse,  polyethylene glycol, sodium chloride flush, traZODone      Active Hospital Problem list    Patient Active Problem List   Diagnosis Date Noted   Acute respiratory failure ([place]) 02/11/2022   Respiratory failure ([place]) 02/11/2022   Acute respiratory failure with hypoxia and hypercarbia ([facility]) 02/11/2022   Acute on chronic systolic CHF (congestive heart failure) ([place]) 02/11/2022   Hypertensive urgency 02/11/2022  Type 2 diabetes mellitus with peripheral neuropathy ([place]) 02/11/2022   OBSTRUCTIVE SLEEP APNEA 04/30/2009    Assessment & Plan:   46 yo morbidly obese AAM with severe decompensated acute systolic and diastolic heart failure with previously documented severe OSA/OHS, noncompliant with CPAP, active smoker, leading to severe hypoxic resp failure with encephalopathy and difficulty with sedation   Severe acute on chronic hypoxic and hypercapnic respiratory failure due to CAP & acute decompensation of chronic heart failure Persistent hypoxemia despite trial of several ventilator strategies -Full vent support, implement lung protective strategies -Plateau pressures less than 30 cm H20 -Wean FiO2 & PEEP as tolerated to maintain O2 sats >88% -Follow intermittent Chest X-ray & ABG as needed -Spontaneous Breathing Trials when respiratory parameters met and mental status permits -Implement VAP Bundle -Prn Bronchodilators -will NOT perform SAT/SBT due to high FiO2 requirements, asynchrony off sedation  -Recruitment maneuvers every 4h and as needed. -Patient cannot be proned, due to weight limit for safe proning and unstable airway (ET tube with constant need of repositioning) -s/p bronchoscopy with removal of ispissated mucus plugging bilaterally with interval improvement post procedure. -Diuresis as BP and renal function permits -s/p Tracheostomy size 8 proximal XLT Shiley   Community-acquired pneumonia: Streptococcus pneumoniae and Haemophilus influenzae ~ TREATED -Monitor fever  curve -Trend WBC's & Procalcitonin -Follow cultures as above -Completed course of ABX as above  Acute on chronic systolic and diastolic heart failure LVEF 40 to 45% consistent with prior studies performed at [facility] -Continuous cardiac monitoring -Maintain MAP >65 -Vasopressors as needed to maintain MAP goal ~ weaned off -Trend lactic acid until normalized -HS Troponin peaked at 24 -2D Echocardiogram shows diastolic dysfunction and enlarged left and right atria consistent with restrictive physiology -Diuresis as blood pressure and renal function permits   ACUTE KIDNEY INJURY/Renal Failure -STAGE 4, Element of cardiorenal syndrome ~ IMPROVING Hypernatremia ~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 -Monitor I&O's / urinary output -Follow BMP -Ensure adequate renal perfusion -Avoid nephrotoxic agents as able -Replace electrolytes as indicated -Increase free water flushes to 200 ml q4hrs and D5W infusion _0  ml/hr -Nephrology- on case, appreciate input -Continue veltassa due to continued hyperkalemia       Intake/Output Summary (Last 24 hours) at 02/27/2022 0731 Last data filed at 02/27/2022 0651 Gross per 24 hour  Intake 10259.37 ml  Output 3900 ml  Net 6359.37 ml       Latest Ref Rng & Units 02/27/2022    4:16 AM 02/26/2022    5:02 AM 02/25/2022    3:42 PM  BMP  Glucose 70 - 99 mg/dL 177  209  123   BUN 6 - 20 mg/dL 81  89  91   Creatinine 0.61 - 1.24 mg/dL 1.65  1.61  1.53   Sodium 135 - 145 mmol/L 154  154  152   Potassium 3.5 - 5.1 mmol/L 5.3  5.0  4.3   Chloride 98 - 111 mmol/L 119  121  121   CO2 22 - 32 mmol/L _1 Calcium 8.9 - 10.3 mg/dL 7.9  8.1  7.6     Diabetes Mellitus -CBG's q4h; Target range of 140 to 180 -SSI -Follow ICU Hypo/Hyperglycemia protocol -HgbA1c  10.1 -Hold Metformin  GI GI PROPHYLAXIS PPI  NUTRITIONAL STATUS DIET-->TF's as  tolerated Constipation protocol as indicated  ACUTE ANEMIA- -Monitor for S/Sx of bleeding -Trend CBC -Heparin SQ for  VTE Prophylaxis (on hold 8/8 for Trach) -Transfuse for Hgb <7   Diet: Tube feeds (on hold 8/8 for Trach) Pain/Anxiety/Delirium protocol (if indicated): Yes (RASS goal -1 to -2) VAP protocol (if indicated): Yes DVT prophylaxis: Heparin SQ GI prophylaxis: PPI Glucose control:  SSI Yes Central venous access:  PICC, and is still needed Arterial line:  yes, and is still needed Foley:  Yes, and it is still needed Mobility:  bed rest  PT consulted: N/A Last date of multidisciplinary goals of care discussion [8/10] Code Status:  full code Disposition: ICU  8/10: Pt's daughter updated at bedside.   IMAGING    -CT CHEST WITH MULTIFOCAL BILATERAL PNEUMONIA    Total critical care time: 35 minutes    Donell Beers, [place] Pager [phone] (please enter 7 digits) PCCM Consult Pager [phone] (please enter 7 digits)

## 2022-02-27 NOTE — Progress Notes (Signed)
Samuel Chavez, Samuel Chavez 02/13/1976 Samuel Nearing, Samuel Chavez   SUBJECTIVE: This 46 y.o. year old male is status post tracheostomy on Tuesday.  He remains on the ventilator.  I was contacted by Dr.  Mortimer Fries because some oozing had been noted from the right side of the tracheostomy wound. Medications:  Current Facility-Administered Medications  Medication Dose Route Frequency Provider Last Rate Last Admin   0.9 %  sodium chloride infusion  250 mL Intravenous Continuous Lang Snow, NP   Held at 02/11/22 2011   acetaminophen (TYLENOL) tablet 650 mg  650 mg Oral Q6H PRN Mansy, Jan A, Samuel Chavez   650 mg at 02/26/22 1922   Or   acetaminophen (TYLENOL) suppository 650 mg  650 mg Rectal Q6H PRN Mansy, Jan A, Samuel Chavez       artificial tears (LACRILUBE) ophthalmic ointment 1 Application  1 Application Both Eyes P6P Rust-Chester, Britton L, NP   1 Application at 95/09/32 0500   Chlorhexidine Gluconate Cloth 2 % PADS 6 each  6 each Topical [id number] Flora Lipps, Samuel Chavez   6 each at 02/27/22 0500   clonazePAM (KLONOPIN) disintegrating tablet 2 mg  2 mg Per Tube BID Flora Lipps, Samuel Chavez   2 mg at 02/27/22 0944   dextrose 5 % solution   Intravenous Continuous Darel Hong D, NP 100 mL/hr at 02/27/22 1048 Infusion Verify at 02/27/22 1048   dextrose 50 % solution 0-50 mL  0-50 mL Intravenous PRN Bradly Bienenstock, NP       docusate sodium (COLACE) capsule 100 mg  100 mg Oral BID PRN Flora Lipps, Samuel Chavez       feeding supplement (PROSource TF) liquid 90 mL  90 mL Per Tube TID Flora Lipps, Samuel Chavez   90 mL at 02/27/22 0944   feeding supplement (VITAL 1.5 CAL) liquid 1,000 mL  1,000 mL Per Tube Continuous Flora Lipps, Samuel Chavez 70 mL/hr at 02/27/22 1048 Infusion Verify at 02/27/22 1048   fentaNYL (SUBLIMAZE) bolus via infusion 50-100 mcg  50-100 mcg Intravenous Q15 min PRN Flora Lipps, Samuel Chavez   100 mcg at 02/15/22 0350   fentaNYL 2536mcg in NS 21mL (104mcg/ml) infusion-PREMIX  50-300 mcg/hr Intravenous Continuous Rust-Chester, Britton L, NP 10  mL/hr at 02/27/22 1048 100 mcg/hr at 02/27/22 1048   free water 200 mL  200 mL Per Tube Q4H Teressa Lower, NP   200 mL at 02/27/22 0800   heparin injection 5,000 Units  5,000 Units Subcutaneous Q8H Flora Lipps, Samuel Chavez   5,000 Units at 02/27/22 0502   insulin regular, human (MYXREDLIN) 100 units/ 100 mL infusion   Intravenous Continuous Darel Hong D, NP 14 mL/hr at 02/27/22 1048 14 Units/hr at 02/27/22 1048   insulin starter kit- pen needles (English) 1 kit  1 kit Other Once Flora Lipps, Samuel Chavez       ipratropium-albuterol (DUONEB) 0.5-2.5 (3) MG/3ML nebulizer solution 3 mL  3 mL Nebulization Q6H Flora Lipps, Samuel Chavez   3 mL at 02/27/22 0846   living well with diabetes book MISC   Does not apply Once Flora Lipps, Samuel Chavez       midazolam (VERSED) 100 mg/100 mL (1 mg/mL) premix infusion  2-10 mg/hr Intravenous Continuous Tyler Pita, Samuel Chavez 2 mL/hr at 02/27/22 1048 2 mg/hr at 02/27/22 1048   midazolam (VERSED) bolus via infusion 2-4 mg  2-4 mg Intravenous Q2H PRN Rust-Chester, Huel Cote, NP   2 mg at 02/23/22 0815   ondansetron (ZOFRAN) tablet 4 mg  4 mg Oral Q6H PRN Mansy, Jan  A, Samuel Chavez       Or   ondansetron (ZOFRAN) injection 4 mg  4 mg Intravenous Q6H PRN Mansy, Arvella Merles, Samuel Chavez       Oral care mouth rinse  15 mL Mouth Rinse Q2H Kasa, Kurian, Samuel Chavez   15 mL at 02/27/22 0800   Oral care mouth rinse  15 mL Mouth Rinse PRN Flora Lipps, Samuel Chavez       oxyCODONE (Oxy IR/ROXICODONE) immediate release tablet 10 mg  10 mg Per Tube Q6H Flora Lipps, Samuel Chavez   10 mg at 02/27/22 0943   pantoprazole sodium (PROTONIX) 40 mg/20 mL oral suspension 40 mg  40 mg Per Tube QHS Benita Gutter, RPH   40 mg at 02/26/22 2200   patiromer (VELTASSA) packet 16.8 g  16.8 g Oral Daily Breeze, Benancio Deeds, NP   16.8 g at 02/27/22 0945   piperacillin-tazobactam (ZOSYN) IVPB 4.5 g  4.5 g Intravenous Q8H Kasa, Maretta Bees, Samuel Chavez       polyethylene glycol (MIRALAX / GLYCOLAX) packet 17 g  17 g Per Tube Daily Delena Bali, RPH   17 g at 02/27/22 8185    polyethylene glycol (MIRALAX / GLYCOLAX) packet 17 g  17 g Per Tube Daily PRN Delena Bali, RPH       senna-docusate (Senokot-S) tablet 1 tablet  1 tablet Per Tube BID Teressa Lower, NP   1 tablet at 02/27/22 0943   sodium chloride flush (NS) 0.9 % injection 10-40 mL  10-40 mL Intracatheter Q12H Kasa, Maretta Bees, Samuel Chavez   10 mL at 02/27/22 0945   sodium chloride flush (NS) 0.9 % injection 10-40 mL  10-40 mL Intracatheter PRN Flora Lipps, Samuel Chavez   40 mL at 02/15/22 0907   traZODone (DESYREL) tablet 25 mg  25 mg Per NG tube QHS PRN Darrick Penna, RPH      .  Medications Prior to Admission  Medication Sig Dispense Refill   losartan (COZAAR) 100 MG tablet Take 100 mg by mouth daily.     OZEMPIC, 0.25 OR 0.5 MG/DOSE, 2 MG/3ML SOPN SMARTSIG:0.25 Milligram(s) SUB-Q Once a Week     torsemide (DEMADEX) 20 MG tablet Take 20 mg by mouth daily.     Aspirin-Salicylamide-Caffeine (BC HEADACHE POWDER PO) Take 1 packet by mouth as needed (for pain).     cephALEXin (KEFLEX) 500 MG capsule Take 1 capsule (500 mg total) by mouth 4 (four) times daily. (Patient not taking: Reported on 02/11/2022) 20 capsule 0   cyclobenzaprine (FLEXERIL) 10 MG tablet Take 0.5-1 tablets (5-10 mg total) by mouth 2 (two) times daily as needed for muscle spasms. (Patient not taking: Reported on 02/11/2022) 20 tablet 0   gabapentin (NEURONTIN) 600 MG tablet Take 600 mg by mouth at bedtime. (Patient not taking: Reported on 02/11/2022)     LABETALOL HCL PO Take by mouth. (Patient not taking: Reported on 02/11/2022)     meloxicam (MOBIC) 15 MG tablet Take 1 tablet (15 mg total) by mouth daily. Take 1 daily with food. (Patient not taking: Reported on 02/11/2022) 10 tablet 0   metFORMIN (GLUCOPHAGE) 500 MG tablet Take 500 mg by mouth 2 (two) times daily with a meal. (Patient not taking: Reported on 02/11/2022)     METFORMIN HCL ER, MOD, PO Take by mouth. (Patient not taking: Reported on 02/11/2022)     METOPROLOL SUCCINATE ER PO Take by mouth.  (Patient not taking: Reported on 02/11/2022)     oxyCODONE-acetaminophen (PERCOCET) 10-325 MG tablet Take 1 tablet by  mouth See admin instructions. Take 1 tablet by mouth 4-5 times a day as needed for pain (Patient not taking: Reported on 02/11/2022)     oxyCODONE-acetaminophen (PERCOCET/ROXICET) 5-325 MG tablet Take 1 tablet by mouth every 8 (eight) hours as needed for severe pain. (Patient not taking: Reported on 02/11/2022) 4 tablet 0   predniSONE (DELTASONE) 20 MG tablet Take 2 tablets (40 mg total) by mouth daily. (Patient not taking: Reported on 02/11/2022) 6 tablet 0    OBJECTIVE:  PHYSICAL EXAM  Vitals: Blood pressure (!) 122/56, pulse (!) 108, temperature (!) 101 F (38.3 C), temperature source Oral, resp. rate (!) 30, height 5' 9.02" (1.753 m), weight (!) 198.5 kg, SpO2 96 %.. General: Patient has a tracheostomy tube in place still on the ventilator and sedated mood: Mood and affect well adjusted, pleasant and cooperative. Neck: There is some dried blood noted mostly on the right side of the tracheostomy wound.  Dressing had some old blood on it but there was no active bleeding.  Dressing was removed.  The wound was carefully inspected and Surgicel placed at the time of surgery it was removed.  The wound was suctioned and reinspected but there was no active bleeding.  Surgicel was placed in either side of the wound to provide some packing and hemostasis for any further oozing might occur.  A new dressing was placed.  The patient tolerated the procedure well.  MEDICAL DECISION MAKING: Data Review:  Results for orders placed or performed during the hospital encounter of 02/11/22 (from the past 48 hour(s))  Glucose, capillary     Status: Abnormal   Collection Time: 02/25/22 12:47 PM  Result Value Ref Range   Glucose-Capillary 164 (H) 70 - 99 mg/dL    Comment: Glucose reference range applies only to samples taken after fasting for at least 8 hours.  Glucose, capillary     Status: Abnormal    Collection Time: 02/25/22  3:39 PM  Result Value Ref Range   Glucose-Capillary 122 (H) 70 - 99 mg/dL    Comment: Glucose reference range applies only to samples taken after fasting for at least 8 hours.  Basic metabolic panel     Status: Abnormal   Collection Time: 02/25/22  3:42 PM  Result Value Ref Range   Sodium 152 (H) 135 - 145 mmol/L   Potassium 4.3 3.5 - 5.1 mmol/L   Chloride 121 (H) 98 - 111 mmol/L   CO2 27 22 - 32 mmol/L   Glucose, Bld 123 (H) 70 - 99 mg/dL    Comment: Glucose reference range applies only to samples taken after fasting for at least 8 hours.   BUN 91 (H) 6 - 20 mg/dL   Creatinine, Ser 2.51 (H) 0.61 - 1.24 mg/dL   Calcium 7.6 (L) 8.9 - 10.3 mg/dL   GFR, Estimated 57 (L) >60 mL/min    Comment: (NOTE) Calculated using the CKD-EPI Creatinine Equation (2021)    Anion gap 4 (L) 5 - 15    Comment: Performed at [facility], [address] [place], [place] [address]  Glucose, capillary     Status: Abnormal   Collection Time: 02/25/22  4:27 PM  Result Value Ref Range   Glucose-Capillary 113 (H) 70 - 99 mg/dL    Comment: Glucose reference range applies only to samples taken after fasting for at least 8 hours.  Glucose, capillary     Status: Abnormal   Collection Time: 02/25/22  6:43 PM  Result Value Ref Range  Glucose-Capillary 217 (H) 70 - 99 mg/dL    Comment: Glucose reference range applies only to samples taken after fasting for at least 8 hours.  Glucose, capillary     Status: Abnormal   Collection Time: 02/25/22  7:42 PM  Result Value Ref Range   Glucose-Capillary 222 (H) 70 - 99 mg/dL    Comment: Glucose reference range applies only to samples taken after fasting for at least 8 hours.  Glucose, capillary     Status: Abnormal   Collection Time: 02/25/22  8:46 PM  Result Value Ref Range   Glucose-Capillary 202 (H) 70 - 99 mg/dL    Comment: Glucose reference range applies only to samples taken after fasting for at least 8 hours.  Glucose,  capillary     Status: Abnormal   Collection Time: 02/25/22  9:57 PM  Result Value Ref Range   Glucose-Capillary 209 (H) 70 - 99 mg/dL    Comment: Glucose reference range applies only to samples taken after fasting for at least 8 hours.  Glucose, capillary     Status: Abnormal   Collection Time: 02/25/22 10:56 PM  Result Value Ref Range   Glucose-Capillary 196 (H) 70 - 99 mg/dL    Comment: Glucose reference range applies only to samples taken after fasting for at least 8 hours.  Glucose, capillary     Status: Abnormal   Collection Time: 02/25/22 11:53 PM  Result Value Ref Range   Glucose-Capillary 185 (H) 70 - 99 mg/dL    Comment: Glucose reference range applies only to samples taken after fasting for at least 8 hours.  Glucose, capillary     Status: Abnormal   Collection Time: 02/26/22  1:01 AM  Result Value Ref Range   Glucose-Capillary 201 (H) 70 - 99 mg/dL    Comment: Glucose reference range applies only to samples taken after fasting for at least 8 hours.  Glucose, capillary     Status: Abnormal   Collection Time: 02/26/22  2:10 AM  Result Value Ref Range   Glucose-Capillary 219 (H) 70 - 99 mg/dL    Comment: Glucose reference range applies only to samples taken after fasting for at least 8 hours.  Glucose, capillary     Status: Abnormal   Collection Time: 02/26/22  3:06 AM  Result Value Ref Range   Glucose-Capillary 198 (H) 70 - 99 mg/dL    Comment: Glucose reference range applies only to samples taken after fasting for at least 8 hours.  Glucose, capillary     Status: Abnormal   Collection Time: 02/26/22  4:08 AM  Result Value Ref Range   Glucose-Capillary 206 (H) 70 - 99 mg/dL    Comment: Glucose reference range applies only to samples taken after fasting for at least 8 hours.  CBC with Differential/Platelet     Status: Abnormal   Collection Time: 02/26/22  5:02 AM  Result Value Ref Range   WBC 12.4 (H) 4.0 - 10.5 K/uL   RBC 3.54 (L) 4.22 - 5.81 MIL/uL   Hemoglobin 9.5  (L) 13.0 - 17.0 g/dL   HCT 34.0 (L) 39.0 - 52.0 %   MCV 96.0 80.0 - 100.0 fL   MCH 26.8 26.0 - 34.0 pg   MCHC 27.9 (L) 30.0 - 36.0 g/dL   RDW 15.5 11.5 - 15.5 %   Platelets 227 150 - 400 K/uL   nRBC 0.3 (H) 0.0 - 0.2 %   Neutrophils Relative % 84 %   Neutro Abs 10.4 (H) 1.7 -  7.7 K/uL   Lymphocytes Relative 9 %   Lymphs Abs 1.1 0.7 - 4.0 K/uL   Monocytes Relative 4 %   Monocytes Absolute 0.5 0.1 - 1.0 K/uL   Eosinophils Relative 2 %   Eosinophils Absolute 0.3 0.0 - 0.5 K/uL   Basophils Relative 0 %   Basophils Absolute 0.0 0.0 - 0.1 K/uL   Immature Granulocytes 1 %   Abs Immature Granulocytes 0.12 (H) 0.00 - 0.07 K/uL    Comment: Performed at [facility], [address] [place], [place] [address]  C-reactive protein     Status: Abnormal   Collection Time: 02/26/22  5:02 AM  Result Value Ref Range   CRP 16.5 (H) <1.0 mg/dL    Comment: Performed at [facility] [address], [place], [place] [address]  Phosphorus     Status: Abnormal   Collection Time: 02/26/22  5:02 AM  Result Value Ref Range   Phosphorus 5.2 (H) 2.5 - 4.6 mg/dL    Comment: Performed at [facility], [address] [place], [place] [address]  Magnesium     Status: Abnormal   Collection Time: 02/26/22  5:02 AM  Result Value Ref Range   Magnesium 2.8 (H) 1.7 - 2.4 mg/dL    Comment: Performed at [facility], [address] [place], [place] [address]  Basic metabolic panel     Status: Abnormal   Collection Time: 02/26/22  5:02 AM  Result Value Ref Range   Sodium 154 (H) 135 - 145 mmol/L   Potassium 5.0 3.5 - 5.1 mmol/L   Chloride 121 (H) 98 - 111 mmol/L   CO2 29 22 - 32 mmol/L   Glucose, Bld 209 (H) 70 - 99 mg/dL    Comment: Glucose reference range applies only to samples taken after fasting for at least 8 hours.   BUN 89 (H) 6 - 20 mg/dL   Creatinine, Ser 1.61 (H) 0.61 - 1.24 mg/dL   Calcium 8.1 (L) 8.9 - 10.3 mg/dL   GFR, Estimated 54 (L) >60 mL/min    Comment:  (NOTE) Calculated using the CKD-EPI Creatinine Equation (2021)    Anion gap 4 (L) 5 - 15    Comment: Performed at [facility], [address] [place], [place] [address]  Glucose, capillary     Status: Abnormal   Collection Time: 02/26/22  5:10 AM  Result Value Ref Range   Glucose-Capillary 194 (H) 70 - 99 mg/dL    Comment: Glucose reference range applies only to samples taken after fasting for at least 8 hours.  Glucose, capillary     Status: Abnormal   Collection Time: 02/26/22  6:31 AM  Result Value Ref Range   Glucose-Capillary 193 (H) 70 - 99 mg/dL    Comment: Glucose reference range applies only to samples taken after fasting for at least 8 hours.  Blood gas, arterial     Status: Abnormal (Preliminary result)   Collection Time: 02/26/22  7:22 AM  Result Value Ref Range   FIO2 100 %   Delivery systems VENTILATOR    MECHVT 500 mL   PEEP 11 cm H20   pH, Arterial 7.29 (L) 7.35 - 7.45   pCO2 arterial 58 (H) 32 - 48 mmHg   pO2, Arterial 108 83 - 108 mmHg   Bicarbonate 27.9 20.0 - 28.0 mmol/L   Acid-Base Excess 0.6 0.0 - 2.0 mmol/L   O2 Saturation 99.4 %   Patient temperature 37.0  Collection site A-LINE    Drawn by [phone]    Allens test (pass/fail) PENDING PASS   Mechanical Rate 30     Comment: Performed at [facility], [address] [place], [place] [address]  Glucose, capillary     Status: Abnormal   Collection Time: 02/26/22  7:32 AM  Result Value Ref Range   Glucose-Capillary 155 (H) 70 - 99 mg/dL    Comment: Glucose reference range applies only to samples taken after fasting for at least 8 hours.  Glucose, capillary     Status: Abnormal   Collection Time: 02/26/22  8:34 AM  Result Value Ref Range   Glucose-Capillary 157 (H) 70 - 99 mg/dL    Comment: Glucose reference range applies only to samples taken after fasting for at least 8 hours.  Glucose, capillary     Status: Abnormal   Collection Time: 02/26/22  9:39 AM  Result Value Ref Range    Glucose-Capillary 141 (H) 70 - 99 mg/dL    Comment: Glucose reference range applies only to samples taken after fasting for at least 8 hours.  Glucose, capillary     Status: Abnormal   Collection Time: 02/26/22 10:30 AM  Result Value Ref Range   Glucose-Capillary 138 (H) 70 - 99 mg/dL    Comment: Glucose reference range applies only to samples taken after fasting for at least 8 hours.  Glucose, capillary     Status: Abnormal   Collection Time: 02/26/22 11:27 AM  Result Value Ref Range   Glucose-Capillary 170 (H) 70 - 99 mg/dL    Comment: Glucose reference range applies only to samples taken after fasting for at least 8 hours.  Glucose, capillary     Status: Abnormal   Collection Time: 02/26/22 12:32 PM  Result Value Ref Range   Glucose-Capillary 192 (H) 70 - 99 mg/dL    Comment: Glucose reference range applies only to samples taken after fasting for at least 8 hours.  Glucose, capillary     Status: Abnormal   Collection Time: 02/26/22  1:45 PM  Result Value Ref Range   Glucose-Capillary 187 (H) 70 - 99 mg/dL    Comment: Glucose reference range applies only to samples taken after fasting for at least 8 hours.  Culture, Respiratory w Gram Stain     Status: None (Preliminary result)   Collection Time: 02/26/22  2:37 PM   Specimen: SPU; Respiratory  Result Value Ref Range   Specimen Description      SPUTUM Performed at [facility], [address] [place], [place] [address]    Special Requests      NONE Performed at [facility], [place], [place] [address]    Gram Stain      FEW GRAM POSITIVE COCCI IN PAIRS IN CLUSTERS FEW WBC PRESENT, PREDOMINANTLY PMN    Culture      TOO YOUNG TO READ Performed at [facility], [place] [address] [place], [place] [address]    Report Status PENDING   Glucose, capillary     Status: Abnormal   Collection Time: 02/26/22  2:40 PM  Result Value Ref Range   Glucose-Capillary 187 (H) 70 - 99 mg/dL     Comment: Glucose reference range applies only to samples taken after fasting for at least 8 hours.  Glucose, capillary     Status: Abnormal   Collection Time: 02/26/22  3:29 PM  Result Value Ref Range   Glucose-Capillary 193 (H) 70 -  99 mg/dL    Comment: Glucose reference range applies only to samples taken after fasting for at least 8 hours.  Glucose, capillary     Status: Abnormal   Collection Time: 02/26/22  4:33 PM  Result Value Ref Range   Glucose-Capillary 159 (H) 70 - 99 mg/dL    Comment: Glucose reference range applies only to samples taken after fasting for at least 8 hours.  Glucose, capillary     Status: Abnormal   Collection Time: 02/26/22  5:42 PM  Result Value Ref Range   Glucose-Capillary 179 (H) 70 - 99 mg/dL    Comment: Glucose reference range applies only to samples taken after fasting for at least 8 hours.  Glucose, capillary     Status: Abnormal   Collection Time: 02/26/22  6:39 PM  Result Value Ref Range   Glucose-Capillary 183 (H) 70 - 99 mg/dL    Comment: Glucose reference range applies only to samples taken after fasting for at least 8 hours.  Glucose, capillary     Status: Abnormal   Collection Time: 02/26/22  7:38 PM  Result Value Ref Range   Glucose-Capillary 175 (H) 70 - 99 mg/dL    Comment: Glucose reference range applies only to samples taken after fasting for at least 8 hours.  Glucose, capillary     Status: Abnormal   Collection Time: 02/26/22  8:37 PM  Result Value Ref Range   Glucose-Capillary 171 (H) 70 - 99 mg/dL    Comment: Glucose reference range applies only to samples taken after fasting for at least 8 hours.  Glucose, capillary     Status: Abnormal   Collection Time: 02/26/22  9:53 PM  Result Value Ref Range   Glucose-Capillary 175 (H) 70 - 99 mg/dL    Comment: Glucose reference range applies only to samples taken after fasting for at least 8 hours.  Glucose, capillary     Status: Abnormal   Collection Time: 02/26/22 11:47 PM  Result  Value Ref Range   Glucose-Capillary 174 (H) 70 - 99 mg/dL    Comment: Glucose reference range applies only to samples taken after fasting for at least 8 hours.  Glucose, capillary     Status: Abnormal   Collection Time: 02/27/22  1:43 AM  Result Value Ref Range   Glucose-Capillary 156 (H) 70 - 99 mg/dL    Comment: Glucose reference range applies only to samples taken after fasting for at least 8 hours.  Glucose, capillary     Status: Abnormal   Collection Time: 02/27/22  3:39 AM  Result Value Ref Range   Glucose-Capillary 177 (H) 70 - 99 mg/dL    Comment: Glucose reference range applies only to samples taken after fasting for at least 8 hours.  CBC with Differential/Platelet     Status: Abnormal   Collection Time: 02/27/22  4:16 AM  Result Value Ref Range   WBC 12.5 (H) 4.0 - 10.5 K/uL   RBC 3.41 (L) 4.22 - 5.81 MIL/uL   Hemoglobin 9.1 (L) 13.0 - 17.0 g/dL   HCT 33.0 (L) 39.0 - 52.0 %   MCV 96.8 80.0 - 100.0 fL   MCH 26.7 26.0 - 34.0 pg   MCHC 27.6 (L) 30.0 - 36.0 g/dL   RDW 15.5 11.5 - 15.5 %   Platelets 227 150 - 400 K/uL   nRBC 0.0 0.0 - 0.2 %   Neutrophils Relative % 85 %   Neutro Abs 10.6 (H) 1.7 - 7.7 K/uL   Lymphocytes Relative  8 %   Lymphs Abs 1.0 0.7 - 4.0 K/uL   Monocytes Relative 4 %   Monocytes Absolute 0.5 0.1 - 1.0 K/uL   Eosinophils Relative 2 %   Eosinophils Absolute 0.3 0.0 - 0.5 K/uL   Basophils Relative 0 %   Basophils Absolute 0.0 0.0 - 0.1 K/uL   Immature Granulocytes 1 %   Abs Immature Granulocytes 0.09 (H) 0.00 - 0.07 K/uL    Comment: Performed at [facility], [address] [place], [place] [address]  C-reactive protein     Status: Abnormal   Collection Time: 02/27/22  4:16 AM  Result Value Ref Range   CRP 19.7 (H) <1.0 mg/dL    Comment: Performed at [facility] [address] [place], [place] [address]  Basic metabolic panel     Status: Abnormal   Collection Time: 02/27/22  4:16 AM  Result Value Ref Range   Sodium 154 (H)  135 - 145 mmol/L   Potassium 5.3 (H) 3.5 - 5.1 mmol/L   Chloride 119 (H) 98 - 111 mmol/L   CO2 28 22 - 32 mmol/L   Glucose, Bld 177 (H) 70 - 99 mg/dL    Comment: Glucose reference range applies only to samples taken after fasting for at least 8 hours.   BUN 81 (H) 6 - 20 mg/dL   Creatinine, Ser 1.65 (H) 0.61 - 1.24 mg/dL   Calcium 7.9 (L) 8.9 - 10.3 mg/dL   GFR, Estimated 52 (L) >60 mL/min    Comment: (NOTE) Calculated using the CKD-EPI Creatinine Equation (2021)    Anion gap 7 5 - 15    Comment: Performed at [facility], [address] [place], [place] [address]  Magnesium     Status: Abnormal   Collection Time: 02/27/22  4:16 AM  Result Value Ref Range   Magnesium 2.8 (H) 1.7 - 2.4 mg/dL    Comment: Performed at [facility], [address] [place], [place] [address]  Phosphorus     Status: Abnormal   Collection Time: 02/27/22  4:16 AM  Result Value Ref Range   Phosphorus 5.3 (H) 2.5 - 4.6 mg/dL    Comment: Performed at [facility], [address] [place],  [address]  Blood gas, arterial     Status: Abnormal   Collection Time: 02/27/22  5:00 AM  Result Value Ref Range   FIO2 80 %   Delivery systems VENTILATOR    Mode PRESSURE REGULATED VOLUME CONTROL    MECHVT 500 mL   RATE 30 resp/min   PEEP 14 cm H20   pH, Arterial 7.28 (L) 7.35 - 7.45    Comment: CRITICAL RESULT CALLED TO, READ BACK BY AND VERIFIED WITH: OUMA, NP AT 0530 ON 02/27/22 CM[facility],RRT     pCO2 arterial 61 (H) 32 - 48 mmHg   pO2, Arterial 88 83 - 108 mmHg   Bicarbonate 28.7 (H) 20.0 - 28.0 mmol/L   Acid-Base Excess 0.5 0.0 - 2.0 mmol/L   O2 Saturation 97.6 %   Patient temperature 37.0    Collection site ARTERIAL DRAW    Allens test (pass/fail) PASS PASS   Mechanical Rate 30     Comment: Performed at [facility], [address] [place], [place] [address]  Glucose, capillary     Status: Abnormal   Collection Time: 02/27/22  5:46 AM  Result Value Ref Range    Glucose-Capillary 178 (H) 70 - 99 mg/dL    Comment: Glucose  reference range applies only to samples taken after fasting for at least 8 hours.  Glucose, capillary     Status: Abnormal   Collection Time: 02/27/22  7:48 AM  Result Value Ref Range   Glucose-Capillary 174 (H) 70 - 99 mg/dL    Comment: Glucose reference range applies only to samples taken after fasting for at least 8 hours.  Glucose, capillary     Status: Abnormal   Collection Time: 02/27/22  9:42 AM  Result Value Ref Range   Glucose-Capillary 181 (H) 70 - 99 mg/dL    Comment: Glucose reference range applies only to samples taken after fasting for at least 8 hours.  Darletta Moll Chest Port 1 View  Result Date: 02/26/2022 CLINICAL DATA:  Hypoxic respiratory failure. EXAM: PORTABLE CHEST 1 VIEW COMPARISON:  Portable chest 02/23/2022 FINDINGS: 5:06 a.m. Interval tracheostomy cannula insertion with the tip 7 cm from the carina. Right PICC tip is in the distal SVC. Feeding tube passing well into the stomach with the radiopaque tip not filmed. Moderate to severe cardiomegaly. There is still mild perihilar vascular congestion but this does show improvement. Interstitial edema has regressed from the upper zones with mild interstitial edema remaining in the bases. There is persistent patchy consolidation in the lower lung fields and small pleural effusions, both findings with interval improvement. The upper lung fields are essentially clear today. IMPRESSION: 1. Improvement in perihilar vascular congestion and interstitial edema. 2. Improvement in bilateral lung opacities with clearance of the upper lung zones and persistent but improved patchy opacities in the lower zones. 3. Small pleural effusions with improvement. 4. Interval tracheostomy cannula insertion, the tip 7 cm from carina. 5. NGT has been exchanged for a feeding tube which is well into the stomach but the radiopaque tip is not included today. Electronically Signed   By: Telford Nab M.D.    On: 02/26/2022 06:23   DG Abd 1 View  Result Date: 02/25/2022 CLINICAL DATA:  NG tube placement EXAM: ABDOMEN - 1 VIEW COMPARISON:  02/18/2022 FINDINGS: Tip of feeding tube is seen in the region of the antrum of the stomach. Bowel gas pattern in the visualized upper portions of abdomen is unremarkable. Lower abdomen, right side of abdomen and pelvis are not included in the image. Transverse diameter heart is increased IMPRESSION: Tip of NG tube is seen in the antrum of the stomach. Electronically Signed   By: Elmer Picker M.D.   On: 02/25/2022 12:00  .   ASSESSMENT: Minor oozing from the tracheostomy wound.  There was no active bleeding found with inspection after removing some of the previously placed Surgicel and suctioning the wound.  This was likely just some capillary oozing which is quite common with tracheostomy wounds as they are open wounds that have to granulate in.  I reapplied Surgicel just in case but it might also be useful to hold his heparin if possible for 24 hours PLAN: No active bleeding, Surgicel placed, hold heparin if feasible for at least 24 hours.   Samuel Nearing, Samuel Chavez 02/27/2022 11:47 AM Patient ID: Samuel Chavez, male   DOB: 05/12/1976, 46 y.o.   MRN: [id number]

## 2022-02-27 NOTE — Consult Note (Addendum)
PHARMACY CONSULT NOTE  Pharmacy Consult for Electrolyte Monitoring and Replacement   Recent Labs: Potassium (mmol/L)  Date Value  02/27/2022 5.3 (H)   Magnesium (mg/dL)  Date Value  02/27/2022 2.8 (H)   Calcium (mg/dL)  Date Value  02/27/2022 7.9 (L)   Albumin (g/dL)  Date Value  02/11/2022 3.5   Phosphorus (mg/dL)  Date Value  02/27/2022 5.3 (H)   Sodium (mmol/L)  Date Value  02/27/2022 154 (H)   Assessment: Patient is a 46 y/o M with medical history including DM, HTN, pancreatitis, tobacco use disorder, systolic CHF, OSA, DM c/b diabetic neuropathy, lumbar radiculopathy, morbid obesity who is admitted with acute respiratory failure in setting of acute CHF and hypertensive urgency. Patient is currently intubated, sedated, and on mechanical ventilation in the ICU. Pharmacy consulted to assist with electrolyte monitoring and replacement as indicated.  Nutrition: Tube feeds at 70 mL/hr (~ 1.68 L/day) + free water 200 mL q4h (1.2 L/day)   Diuretics: IV Lasix 40 mg BID (discontinued 8/1 for AKI) >> IV Lasix 40 mg x 1 trial on 8/7 and repeated 8/8  MIVF: D5w at 100 cc/hr (2.4 L/day)  Misc: DDAVP 4 mcg given 8/5 and 8/6  Nephrology consulted for AKI, patient appears to be experiencing renal recovery  Goal of Therapy:  Electrolytes within normal limits  Plan:  --Na 154, stable today despite I&O + 6L yesterday. Management per PCCM and nephrology --K 5.3, Scr stable. Continue with Veltassa at 16.8 g daily for now --Magnesium & phosphorous elevated in setting of renal dysfunction --Re-check electrolytes with AM labs tomorrow  Benita Gutter  02/27/2022 8:07 AM

## 2022-02-27 NOTE — TOC Progression Note (Signed)
Transition of Care [facility]) - Progression Note    Patient Details  Name: Samuel Chavez MRN: [id number] Date of Birth: [birth year]  Transition of [facility] CM/SW Contact  Shelbie Hutching, RN Phone Number: 02/27/2022, 11:40 AM  Clinical Narrative:    Patient received trach on 8/8, remains on the ventilator in the ICU, continues to have high oxygen requirements.      Expected Discharge Plan:  (TBD) Barriers to Discharge: Continued Medical Work up  Expected Discharge Plan and Services Expected Discharge Plan:  (TBD)   Discharge Planning Services: CM Consult   Living arrangements for the past 2 months: Single Family Home                                       Social Determinants of Health (SDOH) Interventions    Readmission Risk Interventions     No data to display

## 2022-02-27 NOTE — Progress Notes (Signed)
Recruitment Maneuver Completed    PC 0/40 100% for 30 sec x 3   Sat initially low 90's with sat mid to upper 90's upon completion

## 2022-02-27 NOTE — Progress Notes (Signed)
Patient with severe tongue swelling with lacerations with fevers Severe hypoxia, s/p TRACH  Steroids started, IV abx changed to Zosyn  Tongue manipulation completed  with tongue depressors and tongue pushed back into oral cavity without any difficulty.   Patient is NOT clenching down but tongue is very swollen and causing to protrude out of mouth and putting pressure on all teeth.    ADDITIONAL Critical Care Time devoted to patient care services described in this note is 35  minutes.    Corrin Parker, M.D.  [facility]  Medical Director [place] Director [facility] Cardio-Pulmonary Department

## 2022-02-27 NOTE — Progress Notes (Addendum)
[place], [place] 02/27/22  Subjective:   Hospital day # 16  [name] is a 46 y.o.  male with past medical history of diabetes, hypertension, sCHF, OSA, and obesity, who was admitted to [facility] on 02/11/2022 for Acute respiratory failure ([place]) [J96.00] Respiratory failure ([place]) [J96.90] Peripheral edema [R60.9] Acute respiratory failure with hypoxia and hypercapnia ([place]) [J96.01, J96.02]  Update: Patient seen and evaluated at bedside in ICU Daughter asleep at bedside Trach with vent weaned to 75% FiO2 Sedated with fentanyl and Versed Insulin drip D5 at 100 mL/h Foley and Flexi-Seal remain in place Tube feeds, 70 mL/hr Remains febrile  Renal: 08/09 0701 - 08/10 0700 In: 10259.4 [I.V.:3356.2; ZO/XW:9604.5; IV Piggyback:85.5] Out: 3900 [Urine:3900] Lab Results  Component Value Date   CREATININE 1.65 (H) 02/27/2022   CREATININE 1.61 (H) 02/26/2022   CREATININE 1.53 (H) 02/25/2022     Objective:  Vital signs in last 24 hours:  Temp:  [100.8 F (38.2 C)-102.3 F (39.1 C)] 101 F (38.3 C) (08/10 0415) Pulse Rate:  [103-109] 108 (08/10 0850) Resp:  [26-32] 30 (08/10 0850) BP: (102-123)/(53-66) 122/56 (08/10 0800) SpO2:  [92 %-96 %] 96 % (08/10 0850) Arterial Line BP: (98-136)/(57-77) 102/59 (08/10 0800) FiO2 (%):  [75 %-80 %] 75 % (08/10 0850) Weight:  [198.5 kg] 198.5 kg (08/10 0415)  Weight change: -0.5 kg Filed Weights   02/24/22 0500 02/26/22 0500 02/27/22 0415  Weight: (!) 205.9 kg (!) 199 kg (!) 198.5 kg    Intake/Output:    Intake/Output Summary (Last 24 hours) at 02/27/2022 1211 Last data filed at 02/27/2022 1203 Gross per 24 hour  Intake 5047.53 ml  Output 3600 ml  Net 1447.53 ml      Physical Exam: General: Critically ill-appearing, laying in the bed  HEENT NG tube in place, angioedema  Pulm/lungs Ventilator assisted, trach placed 02/25/2022  CVS/Heart Regular  Abdomen:  Soft, nontender, nondistended   Extremities: No dependent edema  Neurologic: Sedated  Skin: Warm, dry  Access: None       Basic Metabolic Panel:  Recent Labs  Lab 02/23/22 0317 02/23/22 2004 02/24/22 0313 02/24/22 1709 02/25/22 0309 02/25/22 1107 02/25/22 1542 02/26/22 0502 02/27/22 0416  NA 159*   < > 155*   < > 150* 140 152* 154* 154*  K 4.9  --  5.8*   < > 4.7 4.0 4.3 5.0 5.3*  CL 125*  --  122*   < > 118* 109 121* 121* 119*  CO2 28  --  26   < > $R'27 24 27 29 28  'CN$ GLUCOSE 175*  --  300*   < > 179* 483* 123* 209* 177*  BUN 129*  --  117*   < > 96* 89* 91* 89* 81*  CREATININE 2.27*  --  1.87*   < > 1.72* 1.45* 1.53* 1.61* 1.65*  CALCIUM 8.3*  --  8.2*   < > 8.1* 7.1* 7.6* 8.1* 7.9*  MG 3.0*  --  3.4*  --  2.8*  --   --  2.8* 2.8*  PHOS 4.6  --  4.8*  --  4.4  --   --  5.2* 5.3*   < > = values in this interval not displayed.      CBC: Recent Labs  Lab 02/23/22 0317 02/24/22 0313 02/25/22 0309 02/26/22 0502 02/27/22 0416  WBC 16.0* 15.6* 16.2* 12.4* 12.5*  NEUTROABS 13.3* 13.7* 13.2* 10.4* 10.6*  HGB 9.4* 9.4* 9.6* 9.5* 9.1*  HCT  33.8* 34.1* 34.6* 34.0* 33.0*  MCV 95.8 94.5 94.5 96.0 96.8  PLT 180 193 227 227 227      No results found for: "HEPBSAG", "HEPBSAB", "HEPBIGM"    Microbiology:  Recent Results (from the past 240 hour(s))  Culture, Respiratory w Gram Stain     Status: None   Collection Time: 02/21/22 11:41 AM   Specimen: Bronchoalveolar Lavage; Respiratory  Result Value Ref Range Status   Specimen Description   Final    BRONCHIAL ALVEOLAR LAVAGE Performed at [facility], [address] [place], [place] [address]    Special Requests   Final    NONE Performed at [facility], [address] [place], [place] [address]    Gram Stain   Final    FEW WBC PRESENT,BOTH PMN AND MONONUCLEAR NO ORGANISMS SEEN    Culture   Final    NO GROWTH 2 DAYS Performed at [facility], [place] [address] [place], [place] [address]    Report Status 02/23/2022  FINAL  Final  Culture, Respiratory w Gram Stain     Status: None (Preliminary result)   Collection Time: 02/26/22  2:37 PM   Specimen: SPU; Respiratory  Result Value Ref Range Status   Specimen Description   Final    SPUTUM Performed at [facility], [address] [place], [place] [address]    Special Requests   Final    NONE Performed at [facility], [place], [place] [address]    Gram Stain   Final    FEW GRAM POSITIVE COCCI IN PAIRS IN CLUSTERS FEW WBC PRESENT, PREDOMINANTLY PMN    Culture   Final    TOO YOUNG TO READ Performed at [facility], [place] [address], [place], [place] [address]    Report Status PENDING  Incomplete    Coagulation Studies: Recent Labs    02/25/22 0309  LABPROT 14.7  INR 1.2     Urinalysis: No results for input(s): "COLORURINE", "LABSPEC", "PHURINE", "GLUCOSEU", "HGBUR", "BILIRUBINUR", "KETONESUR", "PROTEINUR", "UROBILINOGEN", "NITRITE", "LEUKOCYTESUR" in the last 72 hours.  Invalid input(s): "APPERANCEUR"    Imaging: DG Chest Port 1 View  Result Date: 02/26/2022 CLINICAL DATA:  Hypoxic respiratory failure. EXAM: PORTABLE CHEST 1 VIEW COMPARISON:  Portable chest 02/23/2022 FINDINGS: 5:06 a.m. Interval tracheostomy cannula insertion with the tip 7 cm from the carina. Right PICC tip is in the distal SVC. Feeding tube passing well into the stomach with the radiopaque tip not filmed. Moderate to severe cardiomegaly. There is still mild perihilar vascular congestion but this does show improvement. Interstitial edema has regressed from the upper zones with mild interstitial edema remaining in the bases. There is persistent patchy consolidation in the lower lung fields and small pleural effusions, both findings with interval improvement. The upper lung fields are essentially clear today. IMPRESSION: 1. Improvement in perihilar vascular congestion and interstitial edema. 2. Improvement in bilateral lung opacities  with clearance of the upper lung zones and persistent but improved patchy opacities in the lower zones. 3. Small pleural effusions with improvement. 4. Interval tracheostomy cannula insertion, the tip 7 cm from carina. 5. NGT has been exchanged for a feeding tube which is well into the stomach but the radiopaque tip is not included today. Electronically Signed   By: Telford Nab M.D.   On: 02/26/2022 06:23     Medications:    sodium chloride Stopped (02/11/22 2011)   dextrose 100 mL/hr at 02/27/22 1048  feeding supplement (VITAL 1.5 CAL) 70 mL/hr at 02/27/22 1048   fentaNYL infusion INTRAVENOUS 100 mcg/hr (02/27/22 1048)   insulin 14 Units/hr (02/27/22 1048)   midazolam 2 mg/hr (02/27/22 1048)   piperacillin-tazobactam (ZOSYN)  IV      artificial tears  1 Application Both Eyes D7A   Chlorhexidine Gluconate Cloth  6 each Topical Q0600   clonazepam  2 mg Per Tube BID   feeding supplement (PROSource TF)  90 mL Per Tube TID   free water  200 mL Per Tube Q4H   heparin injection (subcutaneous)  5,000 Units Subcutaneous Q8H   insulin starter kit- pen needles  1 kit Other Once   ipratropium-albuterol  3 mL Nebulization Q6H   living well with diabetes book   Does not apply Once   mouth rinse  15 mL Mouth Rinse Q2H   oxyCODONE  10 mg Per Tube Q6H   pantoprazole sodium  40 mg Per Tube QHS   patiromer  16.8 g Oral Daily   polyethylene glycol  17 g Per Tube Daily   senna-docusate  1 tablet Per Tube BID   sodium chloride flush  10-40 mL Intracatheter Q12H   acetaminophen **OR** acetaminophen, dextrose, docusate sodium, fentaNYL, midazolam, ondansetron **OR** ondansetron (ZOFRAN) IV, mouth rinse, polyethylene glycol, sodium chloride flush, traZODone  Assessment/ Plan:  46 y.o. male with medical problems of poorly controlled diabetes, hypertension, systolic CHF, obstructive sleep apnea, obesity    admitted on 02/11/2022 for Acute respiratory failure (HCC) [J96.00] Respiratory failure (HCC)  [J96.90] Peripheral edema [R60.9] Acute respiratory failure with hypoxia and hypercapnia (HCC) [J96.01, J96.02]  #Acute kidney injury with hyperkalemia/hypernatremia.  Baseline creatinine 0.96 from February 11, 2022. Suspected secondary to fever, hypotension and sepsis. No IV contrast exposure or nephrotoxic agents.   Potassium remains elevated, 5.3. Remains on Vital 1.5 tube feeds. Will continue daily veltassa. Sodium remains elevated, 154. Will increase Dextrose to 150 ml/hr. May also consider a dose of DDAVP. Creatinine slowly increasing, will continue to monitor. No acute need for dialysis. UOP adequate.    #Acute respiratory failure Ventilator assisted.  Trach placed on 02/25/2022.   #Acute exacerbation of systolic and diastolic CHF 2D echo from February 11, 2022-LVEF 55 to 60%, moderate LVH, grade 1 diastolic dysfunction, enlarged right ventricle, moderately dilated right atrium, moderately dilated left atrium.    Monitoring fluid status  #Poorly controlled diabetes type 2  Lab Results  Component Value Date   HGBA1C 10.1 (H) 02/11/2022  Patient currently on insulin drip, managed by primary team.    LOS: [place] 8/10/202312:11 PM  [facility] [place], [place]

## 2022-02-28 ENCOUNTER — Inpatient Hospital Stay: Payer: Self-pay

## 2022-02-28 DIAGNOSIS — J189 Pneumonia, unspecified organism: Secondary | ICD-10-CM

## 2022-02-28 DIAGNOSIS — J9602 Acute respiratory failure with hypercapnia: Secondary | ICD-10-CM | POA: Diagnosis not present

## 2022-02-28 DIAGNOSIS — N179 Acute kidney failure, unspecified: Secondary | ICD-10-CM

## 2022-02-28 DIAGNOSIS — J9601 Acute respiratory failure with hypoxia: Secondary | ICD-10-CM | POA: Diagnosis not present

## 2022-02-28 DIAGNOSIS — I5031 Acute diastolic (congestive) heart failure: Secondary | ICD-10-CM | POA: Diagnosis not present

## 2022-02-28 DIAGNOSIS — J80 Acute respiratory distress syndrome: Secondary | ICD-10-CM

## 2022-02-28 LAB — BASIC METABOLIC PANEL
Anion gap: 8 (ref 5–15)
BUN: 111 mg/dL — ABNORMAL HIGH (ref 6–20)
CO2: 24 mmol/L (ref 22–32)
Calcium: 7.7 mg/dL — ABNORMAL LOW (ref 8.9–10.3)
Chloride: 110 mmol/L (ref 98–111)
Creatinine, Ser: 4.15 mg/dL — ABNORMAL HIGH (ref 0.61–1.24)
GFR, Estimated: 17 mL/min — ABNORMAL LOW (ref >60)
Glucose, Bld: 194 mg/dL — ABNORMAL HIGH (ref 70–99)
Potassium: 6.4 mmol/L (ref 3.5–5.1)
Sodium: 142 mmol/L (ref 135–145)

## 2022-02-28 LAB — BLOOD GAS, ARTERIAL
Acid-Base Excess: 3 mmol/L — ABNORMAL HIGH (ref 0.0–2.0)
Acid-Base Excess: 0.6 mmol/L (ref 0.0–2.0)
Bicarbonate: 30.5 mmol/L — ABNORMAL HIGH (ref 20.0–28.0)
Bicarbonate: 27.9 mmol/L (ref 20.0–28.0)
Drawn by: 51910
FIO2: 100 %
MECHVT: 500 mL
Mechanical Rate: 30
O2 Saturation: 62.4 %
O2 Saturation: 99.4 %
PEEP: 11 cmH2O
Patient temperature: 37
Patient temperature: 37
pCO2 arterial: 62 mmHg — ABNORMAL HIGH (ref 32–48)
pCO2 arterial: 58 mmHg — ABNORMAL HIGH (ref 32–48)
pH, Arterial: 7.3 — ABNORMAL LOW (ref 7.35–7.45)
pH, Arterial: 7.29 — ABNORMAL LOW (ref 7.35–7.45)
pO2, Arterial: 39 mmHg — CL (ref 83–108)
pO2, Arterial: 108 mmHg (ref 83–108)

## 2022-02-28 LAB — GLUCOSE, CAPILLARY
Glucose-Capillary: 177 mg/dL — ABNORMAL HIGH (ref 70–99)
Glucose-Capillary: 178 mg/dL — ABNORMAL HIGH (ref 70–99)
Glucose-Capillary: 180 mg/dL — ABNORMAL HIGH (ref 70–99)
Glucose-Capillary: 183 mg/dL — ABNORMAL HIGH (ref 70–99)
Glucose-Capillary: 187 mg/dL — ABNORMAL HIGH (ref 70–99)
Glucose-Capillary: 187 mg/dL — ABNORMAL HIGH (ref 70–99)
Glucose-Capillary: 190 mg/dL — ABNORMAL HIGH (ref 70–99)
Glucose-Capillary: 190 mg/dL — ABNORMAL HIGH (ref 70–99)
Glucose-Capillary: 190 mg/dL — ABNORMAL HIGH (ref 70–99)
Glucose-Capillary: 196 mg/dL — ABNORMAL HIGH (ref 70–99)
Glucose-Capillary: 200 mg/dL — ABNORMAL HIGH (ref 70–99)
Glucose-Capillary: 202 mg/dL — ABNORMAL HIGH (ref 70–99)
Glucose-Capillary: 204 mg/dL — ABNORMAL HIGH (ref 70–99)
Glucose-Capillary: 211 mg/dL — ABNORMAL HIGH (ref 70–99)
Glucose-Capillary: 212 mg/dL — ABNORMAL HIGH (ref 70–99)
Glucose-Capillary: 217 mg/dL — ABNORMAL HIGH (ref 70–99)
Glucose-Capillary: 228 mg/dL — ABNORMAL HIGH (ref 70–99)

## 2022-02-28 LAB — C-REACTIVE PROTEIN: CRP: 20.1 mg/dL — ABNORMAL HIGH (ref <1.0)

## 2022-02-28 LAB — CBC WITH DIFFERENTIAL/PLATELET
Abs Immature Granulocytes: 0.1 10*3/uL — ABNORMAL HIGH (ref 0.00–0.07)
Basophils Absolute: 0 10*3/uL (ref 0.0–0.1)
Basophils Relative: 0 %
Eosinophils Absolute: 0.2 10*3/uL (ref 0.0–0.5)
Eosinophils Relative: 1 %
HCT: 30 % — ABNORMAL LOW (ref 39.0–52.0)
Hemoglobin: 8.4 g/dL — ABNORMAL LOW (ref 13.0–17.0)
Immature Granulocytes: 1 %
Lymphocytes Relative: 6 %
Lymphs Abs: 0.8 10*3/uL (ref 0.7–4.0)
MCH: 27 pg (ref 26.0–34.0)
MCHC: 28 g/dL — ABNORMAL LOW (ref 30.0–36.0)
MCV: 96.5 fL (ref 80.0–100.0)
Monocytes Absolute: 0.5 10*3/uL (ref 0.1–1.0)
Monocytes Relative: 3 %
Neutro Abs: 12.7 10*3/uL — ABNORMAL HIGH (ref 1.7–7.7)
Neutrophils Relative %: 89 %
Platelets: 204 10*3/uL (ref 150–400)
RBC: 3.11 MIL/uL — ABNORMAL LOW (ref 4.22–5.81)
RDW: 15.9 % — ABNORMAL HIGH (ref 11.5–15.5)
WBC: 14.3 10*3/uL — ABNORMAL HIGH (ref 4.0–10.5)
nRBC: 0 % (ref 0.0–0.2)

## 2022-02-28 LAB — BASIC METABOLIC PANEL WITH GFR
Anion gap: 5 (ref 5–15)
BUN: 98 mg/dL — ABNORMAL HIGH (ref 6–20)
CO2: 27 mmol/L (ref 22–32)
Calcium: 7.5 mg/dL — ABNORMAL LOW (ref 8.9–10.3)
Chloride: 117 mmol/L — ABNORMAL HIGH (ref 98–111)
Creatinine, Ser: 2.88 mg/dL — ABNORMAL HIGH (ref 0.61–1.24)
GFR, Estimated: 27 mL/min — ABNORMAL LOW
Glucose, Bld: 228 mg/dL — ABNORMAL HIGH (ref 70–99)
Potassium: 5.4 mmol/L — ABNORMAL HIGH (ref 3.5–5.1)
Sodium: 149 mmol/L — ABNORMAL HIGH (ref 135–145)

## 2022-02-28 LAB — CREATININE, URINE, RANDOM: Creatinine, Urine: 168 mg/dL

## 2022-02-28 LAB — OSMOLALITY, URINE: Osmolality, Ur: 354 mOsm/kg (ref 300–900)

## 2022-02-28 MED ORDER — LINEZOLID 600 MG/300ML IV SOLN
600.0000 mg | Freq: Two times a day (BID) | INTRAVENOUS | Status: DC
Start: 2022-02-28 — End: 2022-03-04
  Administered 2022-02-28 – 2022-03-04 (×9): 600 mg via INTRAVENOUS
  Filled 2022-02-28 (×9): qty 300

## 2022-02-28 MED ORDER — SODIUM ZIRCONIUM CYCLOSILICATE 5 G PO PACK
10.0000 g | PACK | Freq: Every day | ORAL | Status: DC
Start: 2022-02-28 — End: 2022-03-01
  Administered 2022-02-28: 10 g
  Filled 2022-02-28: qty 2

## 2022-02-28 MED ORDER — DEXTROSE 5 % IV SOLN
10.0000 mg/h | INTRAVENOUS | Status: DC
  Administered 2022-02-28: 6 mg/h via INTRAVENOUS
  Administered 2022-03-01: 10 mg/h via INTRAVENOUS
  Filled 2022-02-28: qty 20
  Filled 2022-02-28: qty 10

## 2022-02-28 MED ORDER — INSULIN ASPART 100 UNIT/ML IV SOLN
10.0000 [IU] | Freq: Once | INTRAVENOUS | Status: AC
  Administered 2022-03-01: 10 [IU] via INTRAVENOUS
  Filled 2022-02-28: qty 0.1

## 2022-02-28 MED ORDER — CALCIUM GLUCONATE-NACL 1-0.675 GM/50ML-% IV SOLN
1.0000 g | Freq: Once | INTRAVENOUS | Status: AC
  Administered 2022-03-01: 1000 mg via INTRAVENOUS
  Filled 2022-02-28: qty 50

## 2022-02-28 MED ORDER — IBUPROFEN 100 MG/5ML PO SUSP
400.0000 mg | Freq: Once | ORAL | Status: AC
Start: 2022-02-28 — End: 2022-02-28
  Administered 2022-02-28: 400 mg
  Filled 2022-02-28: qty 20

## 2022-02-28 MED ORDER — IBUPROFEN 100 MG/5ML PO SUSP
400.0000 mg | Freq: Once | ORAL | Status: DC
Start: 2022-02-28 — End: 2022-02-28

## 2022-02-28 MED ORDER — OXYCODONE HCL 5 MG PO TABS
5.0000 mg | ORAL_TABLET | Freq: Four times a day (QID) | ORAL | Status: DC
  Administered 2022-02-28 – 2022-03-02 (×7): 5 mg
  Filled 2022-02-28 (×8): qty 1

## 2022-02-28 MED ORDER — SODIUM ZIRCONIUM CYCLOSILICATE 5 G PO PACK
10.0000 g | PACK | Freq: Once | ORAL | Status: AC
  Administered 2022-03-01: 10 g
  Filled 2022-02-28: qty 2

## 2022-02-28 MED ORDER — FUROSEMIDE 10 MG/ML IJ SOLN
120.0000 mg | Freq: Once | INTRAMUSCULAR | Status: AC
  Administered 2022-02-28: 120 mg via INTRAVENOUS
  Filled 2022-02-28: qty 12

## 2022-02-28 MED ORDER — DEXTROSE 50 % IV SOLN
1.0000 | Freq: Once | INTRAVENOUS | Status: AC
  Administered 2022-03-01: 50 mL via INTRAVENOUS
  Filled 2022-02-28: qty 50

## 2022-02-28 MED ORDER — FAMOTIDINE 20 MG PO TABS
20.0000 mg | ORAL_TABLET | Freq: Every day | ORAL | Status: DC
  Administered 2022-02-28 – 2022-03-10 (×11): 20 mg
  Filled 2022-02-28 (×11): qty 1

## 2022-02-28 MED ORDER — CLONAZEPAM 0.5 MG PO TBDP
1.0000 mg | ORAL_TABLET | Freq: Two times a day (BID) | ORAL | Status: DC
  Administered 2022-02-28 – 2022-03-02 (×4): 1 mg
  Filled 2022-02-28 (×4): qty 2

## 2022-02-28 MED ORDER — FUROSEMIDE 10 MG/ML IJ SOLN
120.0000 mg | Freq: Once | INTRAVENOUS | Status: AC
  Administered 2022-02-28: 120 mg via INTRAVENOUS
  Filled 2022-02-28: qty 10

## 2022-02-28 NOTE — Progress Notes (Signed)
[place], [place] 02/28/22  Subjective:   Hospital day # 17  [name] is a 46 y.o.  male with past medical history of diabetes, hypertension, sCHF, OSA, and obesity, who was admitted to [facility] on 02/11/2022 for Acute respiratory failure ([place]) [J96.00] Respiratory failure ([place]) [J96.90] Peripheral edema [R60.9] Acute respiratory failure with hypoxia and hypercapnia ([place]) [J96.01, J96.02]  Update: Patient seen and evaluated at bedside in ICU Trach with vent weaned to 100% FiO2 Sedated with fentanyl and Versed Insulin drip D5 at 150 mL/h Foley and Flexi-Seal remain in place Tube feeds, 70 mL/hr Remains febrile  Renal: 08/10 0701 - 08/11 0700 In: 4524.9 [I.V.:3675.1; NG/GT:600.8; IV Piggyback:249] Out: 2050 [Urine:2050] Lab Results  Component Value Date   CREATININE 2.88 (H) 02/28/2022   CREATININE 2.08 (H) 02/27/2022   CREATININE 2.00 (H) 02/27/2022     Objective:  Vital signs in last 24 hours:  Temp:  [101 F (38.3 C)-102.7 F (39.3 C)] 101.3 F (38.5 C) (08/11 0800) Pulse Rate:  [101-112] 102 (08/11 0900) Resp:  [16-33] 30 (08/11 0900) BP: (107-129)/(47-64) 116/53 (08/11 0900) SpO2:  [87 %-95 %] 90 % (08/11 1056) Arterial Line BP: (113)/(64) 113/64 (08/10 1200) FiO2 (%):  [75 %-100 %] 90 % (08/11 1056) Weight:  [196 kg] 196 kg (08/11 0429)  Weight change: -2.5 kg Filed Weights   02/26/22 0500 02/27/22 0415 02/28/22 0429  Weight: (!) 199 kg (!) 198.5 kg (!) 196 kg    Intake/Output:    Intake/Output Summary (Last 24 hours) at 02/28/2022 1143 Last data filed at 02/28/2022 0714 Gross per 24 hour  Intake 3799.78 ml  Output 2050 ml  Net 1749.78 ml      Physical Exam: General: Critically ill-appearing, laying in the bed  HEENT NG tube in place, angioedema (improving)  Pulm/lungs Ventilator assisted, trach placed 02/25/2022  CVS/Heart Regular  Abdomen:  Soft, nontender, nondistended  Extremities: No dependent edema   Neurologic: Sedated  Skin: Warm, dry  Access: None       Basic Metabolic Panel:  Recent Labs  Lab 02/24/22 0313 02/24/22 1709 02/25/22 0309 02/25/22 1107 02/26/22 0502 02/27/22 0416 02/27/22 1640 02/27/22 1819 02/28/22 0425  NA 155*   < > 150*   < > 154* 154* 142 151* 149*  K 5.8*   < > 4.7   < > 5.0 5.3* 5.4* 5.8* 5.4*  CL 122*   < > 118*   < > 121* 119* 112* 119* 117*  CO2 26   < > 27   < > _0 GLUCOSE 300*   < > 179*   < > 209* 177* 476* 239* 228*  BUN 117*   < > 96*   < > 89* 81* 79* 90* 98*  CREATININE 1.87*   < > 1.72*   < > 1.61* 1.65* 2.00* 2.08* 2.88*  CALCIUM 8.2*   < > 8.1*   < > 8.1* 7.9* 7.4* 7.8* 7.5*  MG 3.4*  --  2.8*  --  2.8* 2.8* 2.5*  --   --   PHOS 4.8*  --  4.4  --  5.2* 5.3* 3.8  --   --    < > = values in this interval not displayed.      CBC: Recent Labs  Lab 02/24/22 0313 02/25/22 0309 02/26/22 0502 02/27/22 0416 02/28/22 0425  WBC 15.6* 16.2* 12.4* 12.5* 14.3*  NEUTROABS 13.7* 13.2* 10.4* 10.6* 12.7*  HGB 9.4* 9.6* 9.5*  9.1* 8.4*  HCT 34.1* 34.6* 34.0* 33.0* 30.0*  MCV 94.5 94.5 96.0 96.8 96.5  PLT 193 227 227 227 204      No results found for: "HEPBSAG", "HEPBSAB", "HEPBIGM"    Microbiology:  Recent Results (from the past 240 hour(s))  Culture, Respiratory w Gram Stain     Status: None   Collection Time: 02/21/22 11:41 AM   Specimen: Bronchoalveolar Lavage; Respiratory  Result Value Ref Range Status   Specimen Description   Final    BRONCHIAL ALVEOLAR LAVAGE Performed at [facility], [address] [place], [place] [address]    Special Requests   Final    NONE Performed at [facility], [address] [place], [place] [address]    Gram Stain   Final    FEW WBC PRESENT,BOTH PMN AND MONONUCLEAR NO ORGANISMS SEEN    Culture   Final    NO GROWTH 2 DAYS Performed at [facility], [place] [address] [place], [place] [address]    Report Status 02/23/2022 FINAL  Final  Culture,  Respiratory w Gram Stain     Status: None (Preliminary result)   Collection Time: 02/26/22  2:37 PM   Specimen: SPU; Respiratory  Result Value Ref Range Status   Specimen Description   Final    SPUTUM Performed at [facility], [address], [place], [place] [address]    Special Requests   Final    NONE Performed at [facility], [address] [place], [place] [address]    Gram Stain   Final    FEW GRAM POSITIVE COCCI IN PAIRS IN CLUSTERS FEW WBC PRESENT, PREDOMINANTLY PMN    Culture   Final    FEW STAPHYLOCOCCUS AUREUS SUSCEPTIBILITIES TO FOLLOW Performed at [facility], [place] [address], [place], [place] [address]    Report Status PENDING  Incomplete  Respiratory (~20 pathogens) panel by PCR     Status: None   Collection Time: 02/27/22  4:37 PM   Specimen: Nasopharyngeal Swab; Respiratory  Result Value Ref Range Status   Adenovirus NOT DETECTED NOT DETECTED Final   Coronavirus 229E NOT DETECTED NOT DETECTED Final    Comment: (NOTE) The Coronavirus on the Respiratory Panel, DOES NOT test for the novel  Coronavirus (2019 nCoV)    Coronavirus HKU1 NOT DETECTED NOT DETECTED Final   Coronavirus NL63 NOT DETECTED NOT DETECTED Final   Coronavirus OC43 NOT DETECTED NOT DETECTED Final   Metapneumovirus NOT DETECTED NOT DETECTED Final   Rhinovirus / Enterovirus NOT DETECTED NOT DETECTED Final   Influenza A NOT DETECTED NOT DETECTED Final   Influenza B NOT DETECTED NOT DETECTED Final   Parainfluenza Virus 1 NOT DETECTED NOT DETECTED Final   Parainfluenza Virus 2 NOT DETECTED NOT DETECTED Final   Parainfluenza Virus 3 NOT DETECTED NOT DETECTED Final   Parainfluenza Virus 4 NOT DETECTED NOT DETECTED Final   Respiratory Syncytial Virus NOT DETECTED NOT DETECTED Final   Bordetella pertussis NOT DETECTED NOT DETECTED Final   Bordetella Parapertussis NOT DETECTED NOT DETECTED Final   Chlamydophila pneumoniae NOT DETECTED NOT DETECTED Final   Mycoplasma  pneumoniae NOT DETECTED NOT DETECTED Final    Comment: Performed at [facility], [place]. [address], [place], [place] [address]  Aerobic/Anaerobic Culture w Gram Stain (surgical/deep wound)     Status: None (Preliminary result)   Collection Time: 02/27/22  5:07 PM   Specimen: Wound  Result Value Ref Range Status  Specimen Description WOUND  Final   Special Requests RIGHT NASAL CAVITY  Final   Gram Stain   Final    MODERATE GRAM POSITIVE COCCI IN PAIRS RARE GRAM NEGATIVE RODS RARE WBC PRESENT,BOTH PMN AND MONONUCLEAR    Culture   Final    TOO YOUNG TO READ Performed at [facility], [place] [address] [place], [place] [address]    Report Status PENDING  Incomplete  MRSA Next Gen by PCR, Nasal     Status: Abnormal   Collection Time: 02/27/22  6:19 PM   Specimen: Nasal Mucosa; Nasal Swab  Result Value Ref Range Status   MRSA by PCR Next Gen DETECTED (A) NOT DETECTED Final    Comment: RESULT CALLED TO, READ BACK BY AND VERIFIED WITH: Romilda Joy DEW 02/27/22 1928 MU (NOTE) The GeneXpert MRSA Assay (FDA approved for NASAL specimens only), is one component of a comprehensive MRSA colonization surveillance program. It is not intended to diagnose MRSA infection nor to guide or monitor treatment for MRSA infections. Test performance is not FDA approved in patients less than 54 years old. Performed at [facility], [address], [address] [place], [place] [address]     Coagulation Studies: Recent Labs    02/27/22 1210  LABPROT 15.2  INR 1.2     Urinalysis: Recent Labs    02/27/22 1637  COLORURINE YELLOW*  LABSPEC 1.013  PHURINE 5.0  GLUCOSEU NEGATIVE  HGBUR LARGE*  BILIRUBINUR NEGATIVE  KETONESUR NEGATIVE  PROTEINUR 100*  NITRITE NEGATIVE  LEUKOCYTESUR NEGATIVE      Imaging: No results found.   Medications:    sodium chloride Stopped (02/11/22 2011)   dextrose 50 mL/hr at 02/28/22 1128   famotidine (PEPCID) IV Stopped (02/27/22 1902)   feeding  supplement (VITAL 1.5 CAL) 70 mL/hr at 02/28/22 0600   fentaNYL infusion INTRAVENOUS 50 mcg/hr (02/28/22 0714)   insulin 16 Units/hr (02/28/22 1131)   linezolid (ZYVOX) IV 600 mg (02/28/22 1007)   midazolam Stopped (02/27/22 1422)   piperacillin-tazobactam (ZOSYN)  IV 25 mL/hr at 02/28/22 0714    artificial tears  1 Application Both Eyes G9J   carvedilol  3.125 mg Oral BID WC   Chlorhexidine Gluconate Cloth  6 each Topical Q0600   clonazepam  1 mg Per Tube BID   feeding supplement (PROSource TF)  90 mL Per Tube TID   free water  200 mL Per Tube Q4H   heparin injection (subcutaneous)  5,000 Units Subcutaneous Q8H   insulin starter kit- pen needles  1 kit Other Once   ipratropium-albuterol  3 mL Nebulization Q6H   living well with diabetes book   Does not apply Once   mupirocin ointment   Nasal BID   mouth rinse  15 mL Mouth Rinse Q2H   oxyCODONE  5 mg Per Tube Q6H   polyethylene glycol  17 g Per Tube Daily   senna-docusate  1 tablet Per Tube BID   sodium chloride flush  10-40 mL Intracatheter Q12H   sodium zirconium cyclosilicate  10 g Per Tube Daily   acetaminophen **OR** acetaminophen, dextrose, docusate sodium, fentaNYL, midazolam, ondansetron **OR** ondansetron (ZOFRAN) IV, mouth rinse, polyethylene glycol, sodium chloride flush, traZODone  Assessment/ Plan:  46 y.o. male with medical problems of poorly controlled diabetes, hypertension, systolic CHF, obstructive sleep apnea, obesity    admitted on 02/11/2022 for Acute respiratory failure (HCC) [J96.00] Respiratory failure (HCC) [J96.90] Peripheral edema [R60.9] Acute respiratory failure with hypoxia and hypercapnia (HCC) [J96.01, J96.02]  #Acute  kidney injury with hyperkalemia/hypernatremia.  Baseline creatinine 0.96 from February 11, 2022. Suspected secondary to fever, hypotension and sepsis. No IV contrast exposure or nephrotoxic agents.   Potassium remains elevated 5.4. Continue Veltassa. Sodium improving to 149 today. Continue  D5 169m/hr. Creatinine elevated today, good urine output. Will continue to monitor.   #Acute respiratory failure Ventilator assisted.  Trach placed on 02/25/2022. Increased oxygen requirement today   #Acute exacerbation of systolic and diastolic CHF 2D echo from February 11, 2022-LVEF 55 to 60%, moderate LVH, grade 1 diastolic dysfunction, enlarged right ventricle, moderately dilated right atrium, moderately dilated left atrium.    Monitoring fluid status  #Poorly controlled diabetes type 2  Lab Results  Component Value Date   HGBA1C 10.1 (H) 02/11/2022  Patient currently on insulin drip, managed by primary team.    LOS: 1[place]8/11/202311:43 AM  [place]

## 2022-02-28 NOTE — IPAL (Signed)
  Interdisciplinary Goals of Care Family Meeting   Date carried out: 02/28/2022  Location of the meeting: Conference room  Member's involved: Physician, Nurse Practitioner, and Family Member or next of kin  Durable Power of Attorney or acting medical decision maker: Daughters Samuel Chavez and Samuel Chavez.  Discussion: We discussed goals of care for Samuel Chavez.  Present in the meeting were 2 of the patient's daughters well as 2 sisters. Today we discussed his overall clinical status and recent deterioration given his worsening oxygen requirements, anasarca, and developing renal failure.  Discussed different options available to [place] including optimization of his ventilator, diuresis, and possible dialysis.  I explored with the family members there wishes in regards to Samuel Chavez care.  Code status: Full Code  Disposition: Continue current acute care  Time spent for the meeting: [place] Bhargav Barbaro, MD  02/28/2022, 5:01 PM

## 2022-02-28 NOTE — Consult Note (Signed)
PHARMACY CONSULT NOTE  Pharmacy Consult for Electrolyte Monitoring and Replacement   Recent Labs: Potassium (mmol/L)  Date Value  02/28/2022 5.4 (H)   Magnesium (mg/dL)  Date Value  02/27/2022 2.5 (H)   Calcium (mg/dL)  Date Value  02/28/2022 7.5 (L)   Albumin (g/dL)  Date Value  02/11/2022 3.5   Phosphorus (mg/dL)  Date Value  02/27/2022 3.8   Sodium (mmol/L)  Date Value  02/28/2022 149 (H)   Assessment: Patient is a 46 y/o M with medical history including DM, HTN, pancreatitis, tobacco use disorder, systolic CHF, OSA, DM c/b diabetic neuropathy, lumbar radiculopathy, morbid obesity who is admitted with acute respiratory failure in setting of acute CHF and hypertensive urgency. Patient is currently intubated, sedated, and on mechanical ventilation in the ICU. Pharmacy consulted to assist with electrolyte monitoring and replacement as indicated.  Nutrition: Tube feeds at 70 mL/hr (~ 1.68 L/day) + free water 200 mL q4h (1.2 L/day)   Diuretics: IV Lasix 40 mg BID (discontinued 8/1 for AKI) >> IV Lasix 40 mg x 1 trial on 8/7 and repeated 8/8  MIVF: D5w at 150 cc/hr (3.6 L/day)  Misc: DDAVP 4 mcg given 8/5 and 8/6  Nephrology consulted for renal dysfunction and to assist with hypernatremia management  Goal of Therapy:  Electrolytes within normal limits  Plan:  --Na 149, improved. Management per PCCM and nephrology --K 5.4, Scr worse. Continue with Veltassa at 16.8 g daily for now --Magnesium & phosphorous elevated in setting of renal dysfunction --Re-check electrolytes with AM labs tomorrow  Benita Gutter  02/28/2022 7:50 AM

## 2022-02-28 NOTE — Progress Notes (Signed)
Peripherally Inserted Central Catheter Placement  The IV Nurse has discussed with the patient and/or persons authorized to consent for the patient, the purpose of this procedure and the potential benefits and risks involved with this procedure.  The benefits include less needle sticks, lab draws from the catheter, and the patient may be discharged home with the catheter. Risks include, but not limited to, infection, bleeding, blood clot (thrombus formation), and puncture of an artery; nerve damage and irregular heartbeat and possibility to perform a PICC exchange if needed/ordered by physician.  Alternatives to this procedure were also discussed.  Bard Power PICC patient education guide, fact sheet on infection prevention and patient information card has been provided to patient /or left at bedside.    PICC Placement Documentation  PICC Triple Lumen 15/94/58 Right Basilic 45 cm 0 cm (Active)  Indication for Insertion or Continuance of Line Prolonged intravenous therapies 02/28/22 1527  Exposed Catheter (cm) 0 cm 02/28/22 1527  Site Assessment Clean, Dry, Intact 02/28/22 1527  Lumen #1 Status Flushed;Saline locked;Blood return noted 02/28/22 1527  Lumen #2 Status Flushed;Saline locked;Blood return noted 02/28/22 1527  Lumen #3 Status Flushed;Saline locked;Blood return noted 02/28/22 1527  Dressing Type Transparent;Securing device 02/28/22 1527  Dressing Status Antimicrobial disc in place;Clean, Dry, Intact 02/28/22 1527  Dressing Intervention New dressing 02/28/22 1527  Dressing Change Due 03/07/22 02/28/22 [place] 02/28/2022, 3:29 PM

## 2022-02-28 NOTE — Progress Notes (Signed)
Progress Note  Patient Name: ASCENCION COYE Date of Encounter: 02/28/2022  [facility] Cardiologist: New  Subjective   Scr/Bun trending up. BP stable. Hr better. Still febrile. Sedated on trach.   Inpatient Medications    Scheduled Meds:  artificial tears  1 Application Both Eyes K8L   carvedilol  3.125 mg Oral BID WC   Chlorhexidine Gluconate Cloth  6 each Topical Q0600   clonazepam  2 mg Per Tube BID   diphenhydrAMINE  25 mg Intravenous Q12H   feeding supplement (PROSource TF)  90 mL Per Tube TID   free water  200 mL Per Tube Q4H   heparin injection (subcutaneous)  5,000 Units Subcutaneous Q8H   insulin starter kit- pen needles  1 kit Other Once   ipratropium-albuterol  3 mL Nebulization Q6H   living well with diabetes book   Does not apply Once   mupirocin ointment   Nasal BID   mouth rinse  15 mL Mouth Rinse Q2H   oxyCODONE  10 mg Per Tube Q6H   patiromer  16.8 g Oral Daily   polyethylene glycol  17 g Per Tube Daily   senna-docusate  1 tablet Per Tube BID   sodium chloride flush  10-40 mL Intracatheter Q12H   Continuous Infusions:  sodium chloride Stopped (02/11/22 2011)   dextrose 150 mL/hr at 02/28/22 0714   famotidine (PEPCID) IV Stopped (02/27/22 1902)   feeding supplement (VITAL 1.5 CAL) 70 mL/hr at 02/28/22 0600   fentaNYL infusion INTRAVENOUS 50 mcg/hr (02/28/22 0714)   insulin 16 Units/hr (02/28/22 0714)   midazolam Stopped (02/27/22 1422)   piperacillin-tazobactam (ZOSYN)  IV 25 mL/hr at 02/28/22 0714   PRN Meds: acetaminophen **OR** acetaminophen, dextrose, docusate sodium, fentaNYL, midazolam, ondansetron **OR** ondansetron (ZOFRAN) IV, mouth rinse, polyethylene glycol, sodium chloride flush, traZODone   Vital Signs    Vitals:   02/28/22 0429 02/28/22 0500 02/28/22 0600 02/28/22 0700  BP:  (!) 110/56 (!) 110/57 (!) 110/55  Pulse:  (!) 102 (!) 104 (!) 104  Resp:  (!) 28 (!) 32 (!) 30  Temp:      TempSrc:      SpO2:  (!) 89% 92% 95%   Weight: (!) 196 kg     Height:        Intake/Output Summary (Last 24 hours) at 02/28/2022 0828 Last data filed at 02/28/2022 0714 Gross per 24 hour  Intake 4285.46 ml  Output 2050 ml  Net 2235.46 ml      02/28/2022    4:29 AM 02/27/2022    4:15 AM 02/26/2022    5:00 AM  Last 3 Weights  Weight (lbs) 432 lb 1.6 oz 437 lb 9.8 oz 438 lb 11.5 oz  Weight (kg) 196 kg 198.5 kg 199 kg      Telemetry    ST HR around 100, 5 beats NSVT - Personally Reviewed  ECG    No new - Personally Reviewed  Physical Exam   GEN: sedated on trach  Neck: No JVD Cardiac: RRR, no murmurs, rubs, or gallops.  Respiratory: course breath sounds bilaterally GI: Soft, nontender, non-distended  MS: mild lower extremity edema; No deformity. Neuro:  Nonfocal  Psych: Normal affect   Labs    High Sensitivity Troponin:   Recent Labs  Lab 02/11/22 2218 02/12/22 0051  TROPONINIHS 24* 22*     Chemistry Recent Labs  Lab 02/26/22 0502 02/27/22 0416 02/27/22 1640 02/27/22 1819 02/28/22 0425  NA 154* 154* 142 151* 149*  K 5.0 5.3* 5.4* 5.8* 5.4*  CL 121* 119* 112* 119* 117*  CO2 _0 GLUCOSE 209* 177* 476* 239* 228*  BUN 89* 81* 79* 90* 98*  CREATININE 1.61* 1.65* 2.00* 2.08* 2.88*  CALCIUM 8.1* 7.9* 7.4* 7.8* 7.5*  MG 2.8* 2.8* 2.5*  --   --   GFRNONAA 54* 52* 41* 40* 27*  ANIONGAP 4* 7 4* 5 5    Lipids No results for input(s): "CHOL", "TRIG", "HDL", "LABVLDL", "LDLCALC", "CHOLHDL" in the last 168 hours.  Hematology Recent Labs  Lab 02/26/22 0502 02/27/22 0416 02/28/22 0425  WBC 12.4* 12.5* 14.3*  RBC 3.54* 3.41* 3.11*  HGB 9.5* 9.1* 8.4*  HCT 34.0* 33.0* 30.0*  MCV 96.0 96.8 96.5  MCH 26.8 26.7 27.0  MCHC 27.9* 27.6* 28.0*  RDW 15.5 15.5 15.9*  PLT 227 227 204   Thyroid No results for input(s): "TSH", "FREET4" in the last 168 hours.  BNPNo results for input(s): "BNP", "PROBNP" in the last 168 hours.  DDimer No results for input(s): "DDIMER" in the last 168 hours.    Radiology    No results found.  Cardiac Studies   Echo 02/16/22 1. Left ventricular ejection fraction, by estimation, is 40 to 45%. The  left ventricle has mildly decreased function. The left ventricle has no  regional wall motion abnormalities. The left ventricular internal cavity  size was moderately dilated. There  is mild concentric left ventricular hypertrophy.   2. Right ventricular systolic function is normal. The right ventricular  size is moderately enlarged.   3. Left atrial size was moderately dilated.   4. Right atrial size was moderately dilated.   5. The mitral valve is normal in structure. Trivial mitral valve  regurgitation. No evidence of mitral stenosis.   6. The aortic valve is normal in structure. Aortic valve regurgitation is  not visualized. Aortic valve sclerosis is present, with no evidence of  aortic valve stenosis.   7. The inferior vena cava is normal in size with greater than 50%  respiratory variability, suggesting right atrial pressure of 3 mmHg.    Echo 02/11/22  1. Left ventricular ejection fraction, by estimation, is 55 to 60%. The  left ventricle has normal function. Left ventricular endocardial border  not optimally defined to evaluate regional wall motion. The left  ventricular internal cavity size was mildly  dilated. There is moderate left ventricular hypertrophy. Left ventricular  diastolic parameters are consistent with Grade I diastolic dysfunction  (impaired relaxation).   2. Right ventricular systolic function is normal. The right ventricular  size is mildly enlarged. Tricuspid regurgitation signal is inadequate for  assessing PA pressure.   3. Left atrial size was mild to moderately dilated.   4. Right atrial size was moderately dilated.   5. The mitral valve is abnormal. Trivial mitral valve regurgitation. No  evidence of mitral stenosis.   6. The aortic valve has an indeterminant number of cusps. Aortic valve  regurgitation is  not visualized. No aortic stenosis is present.   7. Aortic dilatation noted. There is borderline dilatation of the aortic  root, measuring 39 mm. There is mild dilatation of the ascending aorta,  measuring 39 mm.      The left ventricular size is normal. There is severe concentric left ventricular hypertrophy. There is severe global hypokinesis of the left ventricle. Left ventricular systolic function is severely reduced. LV ejection fraction 30-35%. Grade II diastolic dysfunction. Left ventricular filling pattern is pseudonormal. Left atrial  pressure [LAP] is elevated. The right ventricle is mildly dilated. RV systolic function looks moderately reduced, but TAPSE values are normal. The left atrium is severely dilated. The right atrium is moderately dilated. There is mild to moderate mitral regurgitation. There is mild tricuspid regurgitation. IVC size was moderately dilated. Moderate pulmonary hypertension. Estimated right ventricular systolic pressure is 56 mmHg.  Nov 2021: SUMMARY Left ventricular systolic function is low normal. LV ejection fraction = 45-50%. There is moderate concentric left ventricular hypertrophy. The left atrium is moderately dilated. There is no significant valvular stenosis or regurgitation. There is no pericardial effusion. There is no significant change in comparison with the last study.  June 2021: EF 45-50% SUMMARY biatrial enlargement Mild LVE, moderate LVH, mild global LV hypokinesis, EF 40-45% mild MR since prior study, LV function has improved  Jan 2021 EF 35% Summary Mildly dilated left ventricle with mild to moderate global reduced systolic function. Ejection fraction is visually estimated at 35-40% Moderate concentric left ventricular hypertrophy No significant valvular abnormalities. LV Function has decreased vs. previous echo report Signature  Patient Profile     46 y.o. male  with a hx of DM2, HTN, pancreatitis, ongoing tobacco  use, chronic systolic and diastolic CHF with EF as low as 30-35% in the past, OSA noncompliant with CPAP, h/o right-sided Bells Palsy, lumbar radiculopathy, morbid obesity, noncompliance who is being seen 02/27/2022 for the evaluation of heart failure  Assessment & Plan    Acute systolic and diastolic heart failure - initial Echo 7/25 showed LVEF 55-60%, G1DD - repeat Echo 7/30 showed LVEF 40-45%, mild LVH, moderately dialted atrium, trivial MR - prior echocardiograms showed LVEF down to 30-35% in the past. Does not appear he has undergone ischemic evaluation.  - history of noncompliance with medications - he was hospitalized in June 2023 with similar issues. Sent home on Hydralazine 61mTID, Imdur 312mdaily, Toprol 10048maily, spironolactone 51m26mily, lisinopril 40mg53mly, torsemide 20mg 40m- LVEF reduced in the setting of severe hypoxia/hypercapnic respiratory failure and CAP - he received IV lasix intermittently through admission, Iv lasix 40mg o33m8. Has been limited by kidney function and BP - On admission Scr 1.32, BUN 14. It has been as high as 3.94. today Scr/BUN. Today Scr/BUN 2.88/BUN 98. Which is up from yesterday. Nephrology is following - difficult to assess volume given body habitus. Lower extremity edema noted.  - started on Coreg 3.151mg BI30m-  Not a good cath candidate given medication noncompliance and kidney function    Acute on chronic hypoxic and hypercapnic respiratory failure CAP with persistent hypoxia - s/p tracheostomy - vent support per CCM - febrile - abx per CCM   AKI on CKD stage 4 - suspected 2/2 hypotension, fever and sepsis - Scr/BUN up today - nephrology following   Tobacco use - he smokes 1 ppd  For questions or updates, please contact [facility] Please consult [email or web address] for contact info under        Signed, Kashari Chalmers H Sherline Eberwein,Ninfa Meeker8/05/2022, 8:28 AM

## 2022-02-28 NOTE — Progress Notes (Signed)
NAME:  Samuel Chavez, MRN:  [id number], DOB:  05-26-1976, LOS: 17 ADMISSION DATE:  02/11/2022, CONSULTATION DATE: 02/11/22 REFERRING MD:  Valente David MD  CHIEF COMPLAINT: SOB   CC  follow up RESP FAILURE/acute CHF exacerbation  HPI/SYNOPSIS  46 y.o male with significant PMH as below who presented to the ED with chief complaints of SOB and worsening bilateral lower extremities edema.  ED Course: In the emergency department, the temperature was 37.3C, the heart rate 99 beats/minute, the blood pressure 187/102 mm Hg, the respiratory rate 20 breaths/minute, and the oxygen saturation 89% on. He was noted to be drowsy but still protecting his airway and responding appropriately.  Pertinent INITIAL Labs/Diagnostics Findings: Chemistry:Glucose:319 Calcium: 8.8 otherwise unremarkable CBC: unremarkable Other Lab findings: ABG with hypoxemia and hypercapnia, .  Imaging: Chest x-ray showed bilateral infiltrates worse on right    Significant Hospital Events   7/25: Admitted to hospitalist service w/acute on chronic hypoxic hypercapnic resp. failure requiring BiPAP.Failed BiPAP and intubated. PCCM consulted 7/26: INTUBATED, difficlut to sedate, started KETAMINE INFUSION 7/27: severe hypoxia, PEEP increased to 15 but decreased back to 10 7/28: Persistent severe hypoxia, ventilator changes made 7/29: Persistent hypoxia, recruitment maneuvers instituted, ventilator changes made, empiric heparin as patient cannot be scanned query PE -02/17/22- patient is severely critically ill with bilateral multifocal pneumonia on sedation and paralysis with mechanical ventilation maximal settings.  He is at cusp of death, we met with daughter today and discussed severity of illness. Unable to perform CT due to severity of critical illness. Met with previous PCCM doc and discussed medical plan.  Patient had recruitment maneuvers last few days without improvement, on heparin gtt for possible PE.   02/18/22- patient  continues to require maximal settings on ventilator despite good UOP.  He destaturated to <50% spO2 on MV today required BGV.   02/19/22- patient continues to require 100% FiO2 on maximal setting with high PEEP ladder for possible ARDS.  He was diuresed and on steroids with development of AKI.  His CXR had slight interval improvement on right. We discussed case with vascular surgery regarding possible empiric tPA for PE.  02/20/22- patient was unable to get CT today due to continued severe hypoxemia.  Daughter and other family members at bedside we reviewed case and medical plan.  There seems to be some confusion from family as they had asked me to wake patient up and take off ventilator even though I had explained multiple times that he is at very high risk for death.  They laughed at my comments and seemed to think it was not true. We may still be able to do bronchoscopy but its high risk.  We have reduced IV infusions by switching some medications to OGT route.  02/21/22- patient is weaned to 60%FiO2.  Plan for possible bronch if patient is tolerating.  Family at bedside.  If able to we will obtain more imaging and VQ scan to rule out PE.  02/22/22- Patient weaned to 50% on PRVC.  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 CBC stable , CMP with improved GFR. Monitoring Na, pharmacy and renal following for electrolytes/renal function.  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  Overall marked improvement on ventilator over past 48h. Met with daughter at bedside reviewed medical plan.  02/24/22-Vent requirements continue to slowly improve, currently 40% FiO2 and 14 PEEP.  AKI continues to slowly improve, remains  hyperkalemic with potassium of 5.8.  Placed back on Veltassa, Diurese x1.  Trach scheduled for tomorrow at 1:15 PM 02/25/22- Diurese with Lasix x1 dose. Trach scheduled for today. 02/26/22: Remains mechanically  ventilated via newly placed tracheostomy will attempt to wean ventilator settings today.  Perform WUA  02/26/22: Pt remains mechanically ventilated via tracheostomy vent settings: PEEP 14/FiO2 80% 02/27/22: Fevers, ID consulted. Repeating Tracheal aspirate and UA.  Zosyn started 02/28/22: Persistent fevers, Tracheal aspirate with Staph aureus, start Linezolid.  Worsening Creatinine, considering Lasix gtt given high vent requirements (100% FiO2, 14 peep).  D/c fentanyl gtt and decrease oral benzos/narcotics  Consults:  PCCM Nephrology Vascular Surgery Palliative Care ENT Infectious Disease  Procedures:  7/25: Intubation 7/26: PICC triple-lumen, right basilic vein 1/61: Left radial arterial line  8/08: Size 8 proximal XLT Shiley inserted   Significant Diagnostic Tests:  7/25  Chest Xray:Chest x-ray showed cardiomegaly and pulmonary vascular congestion without overt pulmonary edema 7/25 Echocardiogram: difficult study, LVEF was estimated at 55 to 60%, dilated LV moderate LVH, grade 1 DD, enlargement of the right ventricle, left atrial size moderately dilated, right atrial size moderately dilated this is consistent with restrictive physiology (echo reading not congruous with prior echoes from [facility]) 7/29: Venous US BLE>>IMPRESSION: No lower extremity DVT. 7/30 Limited echo: LVEF 40 to 45% decreased LV function, no wall motion abnormalities, moderate dilation of the LV concentric LVH, right ventricular size moderately enlarged, right atrial enlargement, this is more consistent with prior [facility] scans 8/4: CT Head>>No acute intracranial abnormality. Paranasal sinusitis. Bilateral complete opacification of the mastoid air cells and inner ears, as can be seen in the setting of otitis media. Correlate with symptoms. 8/4: CT Chest/Abdomen/Pelvis>>Extensive ground-glass, alveolar and patchy dense infiltrates in both lungs suggesting multifocal pneumonia. Part of this finding  may suggest underlying pulmonary edema. Small bilateral pleural effusions are seen. Cardiomegaly. There is ectasia of the main pulmonary artery suggesting pulmonary arterial hypertension. There is no evidence of intestinal obstruction or pneumoperitoneum. There is no hydronephrosis. Appendix is not dilated. UB diverticula are seen in the colon without signs of focal diverticulitis. Severe degenerative changes are noted at L4-L5 level with significant interval worsening. Findings may be due to severe disc degeneration. If there is clinical suspicion for discitis or osteomyelitis, follow-up MRI may be considered 8/4: Lung V/Q>>Pulmonary embolism absent.  Micro Data:  7/25: SARS-CoV-2 PCR> negative 7/25: MRSA PCR>> NEG 7/27: Strep pneumoniae Ag>> negative 7/27: Legionella Ag>> negative 7/27: Mycoplasma>> <770 7/27: Sputum>> Strep pneumo,Haemophilus influenzae 7/30 Sputum cult >> negative 8/4: Tracheal aspirate>>negative 8/9: Tracheal aspirate>>STAPHYLOCOCCUS AUREUS  8/10: RVP>>negative 8/10: Right nasal Wound culture>>MODERATE GRAM POSITIVE COCCI IN PAIRS , RARE GRAM NEGATIVE RODS  8/10: MRSA PCR>> Positive  Antimicrobials:  Cefepime 7/27 >>7/31 Ceftriaxone 7/31>>8/4 Vancomycin 7/27 >> 7/29, restarted 7/30 (resumed due to fever spike on Maxipime) ? Resistant Strep pneumo >> 7/31 Zosyn 8/10>> Linezolid 8/11>>   INTERVAL HISTORY / SUBJECTIVE  -With persistent fevers overnight -Repeat tracheal aspirate from 8/9 with Staph aureus ~ will start Linezolid -ID is following ~ will repeat blood cultures, needs CT Chest to r/o empyema, currently at high risk for decompensation with transport given high FiO2 & PEEP (100%, 14) requirements ~ will obtain CT once more clinically stable -TV adjusted to 420 cc (6cc/kg) along with increasing PEEP to follow ARDS protocol -Creatinine worsened today to 2.88 from 2.08, Hyperkalemia improved to 5.4 from 5.8, UOP 2L past 24 hrs (net + 14 L) ~ change to [facility],  consider  Lasix gtt and albumin, will discuss with Nephrology ~ low threshold for having to initiate renal replacement therapy -Still somewhat sedated on low dose fentanyl ~ will wean off and decrease scheduled Clonazepam and Oxycodone ~ if no improvement will need to consider Head CT    REVIEW OF SYSTEMS Patient is unable to provide complete review of systems due to severe critical illness/ventilator dependence  OBJECTIVE  Blood pressure (!) 110/55, pulse (!) 104, temperature (!) 101 F (38.3 C), temperature source Oral, resp. rate (!) 30, height 5' 9.02" (1.753 m), weight (!) 196 kg, SpO2 95 %. CVP:  [17 mmHg-23 mmHg] 18 mmHg   Vent Mode: PRVC FiO2 (%):  [75 %-100 %] 100 % Set Rate:  [30 bmp] 30 bmp Vt Set:  [500 mL] 500 mL PEEP:  [14 cmH20] 14 cmH20 Plateau Pressure:  [25 cmH20-27 cmH20] 27 cmH20   Intake/Output Summary (Last 24 hours) at 02/28/2022 0810 Last data filed at 02/28/2022 8453 Gross per 24 hour  Intake 4761.07 ml  Output 2050 ml  Net 2711.07 ml    Filed Weights   02/26/22 0500 02/27/22 0415 02/28/22 0429  Weight: (!) 199 kg (!) 198.5 kg (!) 196 kg   PHYSICAL EXAMINATION: GENERAL: Acute on chronically ill appearing, Morbidly obese male, intubated, mechanically ventilated, sedated, in NAD HEAD: Normocephalic, atraumatic. EYES: Pupils equal, round, reactive to light.  No scleral icterus.  MOUTH: Macroglossia (massive). NG in place. NECK: Supple. No thyromegaly. Size 8 XLT midline. Difficult to assess JVD due to body habitus. PULMONARY: Faint rhonchi throughout, even, non labored , synchronous with vent CARDIOVASCULAR: NSR, rrr, no M/R/G, 2+ radial/2+ distal pulses, trace generalized edema  ABDOMEN: +BS x4, soft, obese, non distended  MUSCULOSKELETAL: Normal bulk and tone NEUROLOGIC: Sedated, withdraws from pain, opens eyes to voice, pupils PERRL (sluggish 2 mm bilaterally) SKIN: Intact,warm,dry.  Chronic stasis changes in the lower extremities. PSYCH: Not assessed  due to mechanically ventilated status.  Labs/imaging that I havepersonally reviewed    Labs   CBC: Recent Labs  Lab 02/24/22 0313 02/25/22 0309 02/26/22 0502 02/27/22 0416 02/28/22 0425  WBC 15.6* 16.2* 12.4* 12.5* 14.3*  NEUTROABS 13.7* 13.2* 10.4* 10.6* 12.7*  HGB 9.4* 9.6* 9.5* 9.1* 8.4*  HCT 34.1* 34.6* 34.0* 33.0* 30.0*  MCV 94.5 94.5 96.0 96.8 96.5  PLT 193 227 227 227 204     Basic Metabolic Panel: Recent Labs  Lab 02/24/22 0313 02/24/22 1709 02/25/22 0309 02/25/22 1107 02/26/22 0502 02/27/22 0416 02/27/22 1640 02/27/22 1819 02/28/22 0425  NA 155*   < > 150*   < > 154* 154* 142 151* 149*  K 5.8*   < > 4.7   < > 5.0 5.3* 5.4* 5.8* 5.4*  CL 122*   < > 118*   < > 121* 119* 112* 119* 117*  CO2 26   < > 27   < > $R'29 28 26 27 27  'kE$ GLUCOSE 300*   < > 179*   < > 209* 177* 476* 239* 228*  BUN 117*   < > 96*   < > 89* 81* 79* 90* 98*  CREATININE 1.87*   < > 1.72*   < > 1.61* 1.65* 2.00* 2.08* 2.88*  CALCIUM 8.2*   < > 8.1*   < > 8.1* 7.9* 7.4* 7.8* 7.5*  MG 3.4*  --  2.8*  --  2.8* 2.8* 2.5*  --   --   PHOS 4.8*  --  4.4  --  5.2* 5.3* 3.8  --   --    < > =  values in this interval not displayed.    GFR: Estimated Creatinine Clearance: 55.9 mL/min (A) (by C-G formula based on SCr of 2.88 mg/dL (H)). Recent Labs  Lab 02/25/22 0309 02/26/22 0502 02/27/22 0416 02/27/22 1640 02/28/22 0425  PROCALCITON  --   --   --  0.17  --   WBC 16.2* 12.4* 12.5*  --  14.3*     Liver Function Tests: No results for input(s): "AST", "ALT", "ALKPHOS", "BILITOT", "PROT", "ALBUMIN" in the last 168 hours.  ABG    Component Value Date/Time   PHART 7.28 (L) 02/27/2022 0500   PCO2ART 61 (H) 02/27/2022 0500   PO2ART 88 02/27/2022 0500   HCO3 28.7 (H) 02/27/2022 0500   TCO2 26 03/17/2007 1822   O2SAT 97.6 02/27/2022 0500     Home Medications  Prior to Admission medications   Medication Sig Start Date End Date Taking? Authorizing Provider  losartan (COZAAR) 100 MG tablet Take  100 mg by mouth daily. 02/01/22  Yes [provider]  OZEMPIC, 0.25 OR 0.5 MG/DOSE, 2 MG/3ML SOPN SMARTSIG:0.25 Milligram(s) SUB-Q Once a Week 12/09/21  Yes [provider]  torsemide (DEMADEX) 20 MG tablet Take 20 mg by mouth daily. 12/18/21  Yes [provider]  Aspirin-Salicylamide-Caffeine (BC HEADACHE POWDER PO) Take 1 packet by mouth as needed (for pain).    [provider]  cephALEXin (KEFLEX) 500 MG capsule Take 1 capsule (500 mg total) by mouth 4 (four) times daily. Patient not taking: Reported on 02/11/2022 12/16/18   Davonna Belling, MD  cyclobenzaprine (FLEXERIL) 10 MG tablet Take 0.5-1 tablets (5-10 mg total) by mouth 2 (two) times daily as needed for muscle spasms. Patient not taking: Reported on 02/11/2022 03/12/18   Margarita Mail, PA-C  gabapentin (NEURONTIN) 600 MG tablet Take 600 mg by mouth at bedtime. Patient not taking: Reported on 02/11/2022 10/13/21   [provider]  LABETALOL HCL PO Take by mouth. Patient not taking: Reported on 02/11/2022    [provider]  meloxicam (MOBIC) 15 MG tablet Take 1 tablet (15 mg total) by mouth daily. Take 1 daily with food. Patient not taking: Reported on 02/11/2022 03/12/18   Margarita Mail, PA-C  metFORMIN (GLUCOPHAGE) 500 MG tablet Take 500 mg by mouth 2 (two) times daily with a meal. Patient not taking: Reported on 02/11/2022    [provider]  METFORMIN HCL ER, MOD, PO Take by mouth. Patient not taking: Reported on 02/11/2022    [provider]  METOPROLOL SUCCINATE ER PO Take by mouth. Patient not taking: Reported on 02/11/2022    [provider]  oxyCODONE-acetaminophen (PERCOCET) 10-325 MG tablet Take 1 tablet by mouth See admin instructions. Take 1 tablet by mouth 4-5 times a day as needed for pain Patient not taking: Reported on 02/11/2022    [provider]  oxyCODONE-acetaminophen (PERCOCET/ROXICET) 5-325 MG tablet Take 1 tablet by mouth every 8  (eight) hours as needed for severe pain. Patient not taking: Reported on 02/11/2022 12/16/18   Davonna Belling, MD  predniSONE (DELTASONE) 20 MG tablet Take 2 tablets (40 mg total) by mouth daily. Patient not taking: Reported on 02/11/2022 04/04/15   Davonna Belling, MD   Hospital Scheduled Meds:  artificial tears  1 Application Both Eyes D9I   carvedilol  3.125 mg Oral BID WC   Chlorhexidine Gluconate Cloth  6 each Topical Q0600   clonazepam  2 mg Per Tube BID   diphenhydrAMINE  25 mg Intravenous Q12H   feeding supplement (PROSource  TF)  90 mL Per Tube TID   free water  200 mL Per Tube Q4H   heparin injection (subcutaneous)  5,000 Units Subcutaneous Q8H   insulin starter kit- pen needles  1 kit Other Once   ipratropium-albuterol  3 mL Nebulization Q6H   living well with diabetes book   Does not apply Once   mupirocin ointment   Nasal BID   mouth rinse  15 mL Mouth Rinse Q2H   oxyCODONE  10 mg Per Tube Q6H   patiromer  16.8 g Oral Daily   polyethylene glycol  17 g Per Tube Daily   senna-docusate  1 tablet Per Tube BID   sodium chloride flush  10-40 mL Intracatheter Q12H   Continuous Infusions:  sodium chloride Stopped (02/11/22 2011)   dextrose 150 mL/hr at 02/28/22 0714   famotidine (PEPCID) IV Stopped (02/27/22 1902)   feeding supplement (VITAL 1.5 CAL) 70 mL/hr at 02/28/22 0600   fentaNYL infusion INTRAVENOUS 50 mcg/hr (02/28/22 0714)   insulin 16 Units/hr (02/28/22 0714)   midazolam Stopped (02/27/22 1422)   piperacillin-tazobactam (ZOSYN)  IV 25 mL/hr at 02/28/22 0714   PRN Meds:.acetaminophen **OR** acetaminophen, dextrose, docusate sodium, fentaNYL, midazolam, ondansetron **OR** ondansetron (ZOFRAN) IV, mouth rinse, polyethylene glycol, sodium chloride flush, traZODone      Active Hospital Problem list    Patient Active Problem List   Diagnosis Date Noted   Acute respiratory failure ([place]) 02/11/2022   Respiratory failure ([place]) 02/11/2022   Acute respiratory  failure with hypoxia and hypercarbia ([facility]) 02/11/2022   Acute on chronic systolic CHF (congestive heart failure) ([place]) 02/11/2022   Hypertensive urgency 02/11/2022   Type 2 diabetes mellitus with peripheral neuropathy ([place]) 02/11/2022   OBSTRUCTIVE SLEEP APNEA 04/30/2009    Assessment & Plan:   46 yo morbidly obese AAM with severe decompensated acute systolic and diastolic heart failure with previously documented severe OSA/OHS, noncompliant with CPAP, active smoker, leading to severe hypoxic resp failure with encephalopathy and difficulty with sedation   Severe acute on chronic hypoxic &hypercapnic/ VENTILATOR DEPENDENT Respiratory Failure due to CAP & acute decompensation of chronic heart failure Persistent hypoxemia despite trial of several ventilator strategies  -s/p Tracheostomy size 8 proximal XLT Shiley -Full vent support, implement lung protective strategies -Plateau pressures less than 30 cm H20 -Wean FiO2 & PEEP as tolerated to maintain O2 sats >88% -Follow intermittent Chest X-ray & ABG as needed -Spontaneous Breathing Trials when respiratory parameters met and mental status permits -Implement VAP Bundle -Prn Bronchodilators -will NOT perform SAT/SBT due to high FiO2 requirements today 8/11 -Recruitment maneuvers every 4h and as needed. -Patient cannot be proned, due to weight limit for safe proning and unstable airway (ET tube with constant need of repositioning) -s/p bronchoscopy with removal of ispissated mucus plugging bilaterally with interval improvement post procedure. -Diuresis as BP and renal function permits   Community-acquired pneumonia: Streptococcus pneumoniae and Haemophilus influenzae ~ TREATED Persistent Fevers & Staph Aureus Pneumonia (VAP) -Monitor fever curve -Trend WBC's & Procalcitonin -Follow cultures as above -Continue Zosyn, will start Linezolid; ID is consulted, will defer further ABX to ID, appreciate input -Repeat UA is negative, will repeat  Blood cultures -Needs CT Chest to rule out empyema, will obtain once clinically feasible, currently on 100% FiO2 and 14 PEEP, high risk for decompensation with transport  Acute on chronic systolic and diastolic heart failure LVEF 40 to 45% consistent with prior studies performed at [facility] -Continuous cardiac monitoring -Maintain MAP >65 -Vasopressors as needed  to maintain MAP goal ~ weaned off -Trend lactic acid until normalized -HS Troponin peaked at 24 -2D Echocardiogram shows diastolic dysfunction and enlarged left and right atria consistent with restrictive physiology -Diuresis as blood pressure and renal function permits ~ considering Lasix gtt 8/11  ACUTE KIDNEY INJURY/Renal Failure -STAGE 4, Element of cardiorenal syndrome  Hypernatremia ~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 -Monitor I&O's / urinary output -Follow BMP -Ensure adequate renal perfusion -Avoid nephrotoxic agents as able -Replace electrolytes as indicated -Nephrology- on case, appreciate input, may need Renal replacement therapy -Change to [facility] due to continued hyperkalemia       Intake/Output Summary (Last 24 hours) at 02/28/2022 0810 Last data filed at 02/28/2022 0714 Gross per 24 hour  Intake 4761.07 ml  Output 2050 ml  Net 2711.07 ml        Latest Ref Rng & Units 02/28/2022    4:25 AM 02/27/2022    6:19 PM 02/27/2022    4:40 PM  BMP  Glucose 70 - 99 mg/dL 228  239  476   BUN 6 - 20 mg/dL 98  90  79   Creatinine 0.61 - 1.24 mg/dL 2.88  2.08  2.00   Sodium 135 - 145 mmol/L 149  151  142   Potassium 3.5 - 5.1 mmol/L 5.4  5.8  5.4   Chloride 98 - 111 mmol/L 117  119  112   CO2 22 - 32 mmol/L $RemoveB'27  27  26   'VLzoPiRp$ Calcium 8.9 - 10.3 mg/dL 7.5  7.8  7.4     Acute Metabolic Encephalopathy in setting of multiple metabolic derangements, Sepsis, and critical illness Sedation needs in setting of mechanical  ventilation -Maintain a RASS goal of 0 s/p Tracheostomy -Fentanyl prn as needed to maintain RASS goal -Avoid sedating medications as able -Decrease scheduled Klonopin & Oxycodone -Daily wake up assessment -CT Head 8/4 negative for acute intracranial abnormality ~ may need to consider repeating or obtaining MRI if no improvement in mental status   Diabetes Mellitus -CBG's q4h; Target range of 140 to 180 -Currently on insulin gtt -Follow ICU Hypo/Hyperglycemia protocol -HgbA1c  10.1 -Hold Metformin  GI GI PROPHYLAXIS PPI  NUTRITIONAL STATUS DIET-->TF's as tolerated Constipation protocol as indicated  ACUTE ANEMIA- -Monitor for S/Sx of bleeding -Trend CBC -Heparin SQ for VTE Prophylaxis (on hold 8/8 for Trach) -Transfuse for Hgb <7     Best Practices:   Diet: Tube feeds  Pain/Anxiety/Delirium protocol (if indicated): Yes (RASS goal -1 to -2) VAP protocol (if indicated): Yes DVT prophylaxis: Heparin SQ GI prophylaxis: PPI Glucose control:  Insulin gtt,  Yes Central venous access:  PICC, and is still needed Arterial line:  N/A Foley:  Yes, and it is still needed Mobility:  bed rest  PT consulted: N/A Last date of multidisciplinary goals of care discussion [8/11] Code Status:  full code Disposition: ICU  Will update pt's daughter when she arrives at bedside.        Total critical care time: 40 minutes   Darel Hong, AGACNP-BC [facility] Prefer epic messenger for cross cover needs If after hours, please call E-link

## 2022-02-28 NOTE — Progress Notes (Signed)
..02/28/2022 1:15 PM  Samuel Chavez [id number]  Post-Op Day 2    Temp:  [100.8 F (38.2 C)-102.7 F (39.3 C)] 100.8 F (38.2 C) (08/11 1100) Pulse Rate:  [101-111] 102 (08/11 0900) Resp:  [16-33] 30 (08/11 0900) BP: (107-128)/(47-64) 116/53 (08/11 0900) SpO2:  [87 %-95 %] 90 % (08/11 1056) FiO2 (%):  [75 %-100 %] 90 % (08/11 1056) Weight:  [956 kg] 196 kg (08/11 0429),     Intake/Output Summary (Last 24 hours) at 02/28/2022 1315 Last data filed at 02/28/2022 2130 Gross per 24 hour  Intake 3799.78 ml  Output 1250 ml  Net 2549.78 ml    Results for orders placed or performed during the hospital encounter of 02/11/22 (from the past 24 hour(s))  Glucose, capillary     Status: Abnormal   Collection Time: 02/27/22  1:40 PM  Result Value Ref Range   Glucose-Capillary 166 (H) 70 - 99 mg/dL  Glucose, capillary     Status: Abnormal   Collection Time: 02/27/22  2:40 PM  Result Value Ref Range   Glucose-Capillary 177 (H) 70 - 99 mg/dL  Glucose, capillary     Status: Abnormal   Collection Time: 02/27/22  4:35 PM  Result Value Ref Range   Glucose-Capillary 204 (H) 70 - 99 mg/dL  Urinalysis, Complete w Microscopic     Status: Abnormal   Collection Time: 02/27/22  4:37 PM  Result Value Ref Range   Color, Urine YELLOW (A) YELLOW   APPearance CLOUDY (A) CLEAR   Specific Gravity, Urine 1.013 1.005 - 1.030   pH 5.0 5.0 - 8.0   Glucose, UA NEGATIVE NEGATIVE mg/dL   Hgb urine dipstick LARGE (A) NEGATIVE   Bilirubin Urine NEGATIVE NEGATIVE   Ketones, ur NEGATIVE NEGATIVE mg/dL   Protein, ur 100 (A) NEGATIVE mg/dL   Nitrite NEGATIVE NEGATIVE   Leukocytes,Ua NEGATIVE NEGATIVE   RBC / HPF 0-5 0 - 5 RBC/hpf   WBC, UA 0-5 0 - 5 WBC/hpf   Bacteria, UA RARE (A) NONE SEEN   Squamous Epithelial / LPF 0-5 0 - 5   Mucus PRESENT    Hyaline Casts, UA PRESENT   Respiratory (~20 pathogens) panel by PCR     Status: None   Collection Time: 02/27/22  4:37 PM   Specimen: Nasopharyngeal Swab;  Respiratory  Result Value Ref Range   Adenovirus NOT DETECTED NOT DETECTED   Coronavirus 229E NOT DETECTED NOT DETECTED   Coronavirus HKU1 NOT DETECTED NOT DETECTED   Coronavirus NL63 NOT DETECTED NOT DETECTED   Coronavirus OC43 NOT DETECTED NOT DETECTED   Metapneumovirus NOT DETECTED NOT DETECTED   Rhinovirus / Enterovirus NOT DETECTED NOT DETECTED   Influenza A NOT DETECTED NOT DETECTED   Influenza B NOT DETECTED NOT DETECTED   Parainfluenza Virus 1 NOT DETECTED NOT DETECTED   Parainfluenza Virus 2 NOT DETECTED NOT DETECTED   Parainfluenza Virus 3 NOT DETECTED NOT DETECTED   Parainfluenza Virus 4 NOT DETECTED NOT DETECTED   Respiratory Syncytial Virus NOT DETECTED NOT DETECTED   Bordetella pertussis NOT DETECTED NOT DETECTED   Bordetella Parapertussis NOT DETECTED NOT DETECTED   Chlamydophila pneumoniae NOT DETECTED NOT DETECTED   Mycoplasma pneumoniae NOT DETECTED NOT DETECTED  Basic metabolic panel     Status: Abnormal   Collection Time: 02/27/22  4:40 PM  Result Value Ref Range   Sodium 142 135 - 145 mmol/L   Potassium 5.4 (H) 3.5 - 5.1 mmol/L   Chloride 112 (H) 98 - 111 mmol/L  CO2 26 22 - 32 mmol/L   Glucose, Bld 476 (H) 70 - 99 mg/dL   BUN 79 (H) 6 - 20 mg/dL   Creatinine, Ser 2.00 (H) 0.61 - 1.24 mg/dL   Calcium 7.4 (L) 8.9 - 10.3 mg/dL   GFR, Estimated 41 (L) >60 mL/min   Anion gap 4 (L) 5 - 15  Procalcitonin - Baseline     Status: None   Collection Time: 02/27/22  4:40 PM  Result Value Ref Range   Procalcitonin 0.17 ng/mL  Magnesium     Status: Abnormal   Collection Time: 02/27/22  4:40 PM  Result Value Ref Range   Magnesium 2.5 (H) 1.7 - 2.4 mg/dL  Phosphorus     Status: None   Collection Time: 02/27/22  4:40 PM  Result Value Ref Range   Phosphorus 3.8 2.5 - 4.6 mg/dL  Aerobic/Anaerobic Culture w Gram Stain (surgical/deep wound)     Status: None (Preliminary result)   Collection Time: 02/27/22  5:07 PM   Specimen: Wound  Result Value Ref Range   Specimen  Description WOUND    Special Requests RIGHT NASAL CAVITY    Gram Stain      MODERATE GRAM POSITIVE COCCI IN PAIRS RARE GRAM NEGATIVE RODS RARE WBC PRESENT,BOTH PMN AND MONONUCLEAR    Culture      TOO YOUNG TO READ Performed at [facility], [place] [address], [place], [place] [address]    Report Status PENDING   Glucose, capillary     Status: Abnormal   Collection Time: 02/27/22  6:10 PM  Result Value Ref Range   Glucose-Capillary 213 (H) 70 - 99 mg/dL  MRSA Next Gen by PCR, Nasal     Status: Abnormal   Collection Time: 02/27/22  6:19 PM   Specimen: Nasal Mucosa; Nasal Swab  Result Value Ref Range   MRSA by PCR Next Gen DETECTED (A) NOT DETECTED  Basic metabolic panel     Status: Abnormal   Collection Time: 02/27/22  6:19 PM  Result Value Ref Range   Sodium 151 (H) 135 - 145 mmol/L   Potassium 5.8 (H) 3.5 - 5.1 mmol/L   Chloride 119 (H) 98 - 111 mmol/L   CO2 27 22 - 32 mmol/L   Glucose, Bld 239 (H) 70 - 99 mg/dL   BUN 90 (H) 6 - 20 mg/dL   Creatinine, Ser 2.08 (H) 0.61 - 1.24 mg/dL   Calcium 7.8 (L) 8.9 - 10.3 mg/dL   GFR, Estimated 40 (L) >60 mL/min   Anion gap 5 5 - 15  Glucose, capillary     Status: Abnormal   Collection Time: 02/27/22  7:31 PM  Result Value Ref Range   Glucose-Capillary 239 (H) 70 - 99 mg/dL  Glucose, capillary     Status: Abnormal   Collection Time: 02/27/22  8:33 PM  Result Value Ref Range   Glucose-Capillary 187 (H) 70 - 99 mg/dL  Glucose, capillary     Status: Abnormal   Collection Time: 02/27/22  9:32 PM  Result Value Ref Range   Glucose-Capillary 220 (H) 70 - 99 mg/dL  Glucose, capillary     Status: Abnormal   Collection Time: 02/27/22 10:45 PM  Result Value Ref Range   Glucose-Capillary 183 (H) 70 - 99 mg/dL  Glucose, capillary     Status: Abnormal   Collection Time: 02/27/22 11:33 PM  Result Value Ref Range   Glucose-Capillary 195 (H) 70 - 99 mg/dL  Glucose, capillary  Status: Abnormal   Collection Time: 02/27/22 11:48 PM   Result Value Ref Range   Glucose-Capillary 195 (H) 70 - 99 mg/dL  Glucose, capillary     Status: Abnormal   Collection Time: 02/28/22 12:43 AM  Result Value Ref Range   Glucose-Capillary 190 (H) 70 - 99 mg/dL  Glucose, capillary     Status: Abnormal   Collection Time: 02/28/22  1:40 AM  Result Value Ref Range   Glucose-Capillary 200 (H) 70 - 99 mg/dL  Glucose, capillary     Status: Abnormal   Collection Time: 02/28/22  2:42 AM  Result Value Ref Range   Glucose-Capillary 183 (H) 70 - 99 mg/dL  Glucose, capillary     Status: Abnormal   Collection Time: 02/28/22  3:37 AM  Result Value Ref Range   Glucose-Capillary 228 (H) 70 - 99 mg/dL  CBC with Differential/Platelet     Status: Abnormal   Collection Time: 02/28/22  4:25 AM  Result Value Ref Range   WBC 14.3 (H) 4.0 - 10.5 K/uL   RBC 3.11 (L) 4.22 - 5.81 MIL/uL   Hemoglobin 8.4 (L) 13.0 - 17.0 g/dL   HCT 30.0 (L) 39.0 - 52.0 %   MCV 96.5 80.0 - 100.0 fL   MCH 27.0 26.0 - 34.0 pg   MCHC 28.0 (L) 30.0 - 36.0 g/dL   RDW 15.9 (H) 11.5 - 15.5 %   Platelets 204 150 - 400 K/uL   nRBC 0.0 0.0 - 0.2 %   Neutrophils Relative % 89 %   Neutro Abs 12.7 (H) 1.7 - 7.7 K/uL   Lymphocytes Relative 6 %   Lymphs Abs 0.8 0.7 - 4.0 K/uL   Monocytes Relative 3 %   Monocytes Absolute 0.5 0.1 - 1.0 K/uL   Eosinophils Relative 1 %   Eosinophils Absolute 0.2 0.0 - 0.5 K/uL   Basophils Relative 0 %   Basophils Absolute 0.0 0.0 - 0.1 K/uL   Immature Granulocytes 1 %   Abs Immature Granulocytes 0.10 (H) 0.00 - 0.07 K/uL  C-reactive protein     Status: Abnormal   Collection Time: 02/28/22  4:25 AM  Result Value Ref Range   CRP 20.1 (H) <1.0 mg/dL  Basic metabolic panel     Status: Abnormal   Collection Time: 02/28/22  4:25 AM  Result Value Ref Range   Sodium 149 (H) 135 - 145 mmol/L   Potassium 5.4 (H) 3.5 - 5.1 mmol/L   Chloride 117 (H) 98 - 111 mmol/L   CO2 27 22 - 32 mmol/L   Glucose, Bld 228 (H) 70 - 99 mg/dL   BUN 98 (H) 6 - 20 mg/dL    Creatinine, Ser 2.88 (H) 0.61 - 1.24 mg/dL   Calcium 7.5 (L) 8.9 - 10.3 mg/dL   GFR, Estimated 27 (L) >60 mL/min   Anion gap 5 5 - 15  Glucose, capillary     Status: Abnormal   Collection Time: 02/28/22  4:38 AM  Result Value Ref Range   Glucose-Capillary 212 (H) 70 - 99 mg/dL  Glucose, capillary     Status: Abnormal   Collection Time: 02/28/22  5:38 AM  Result Value Ref Range   Glucose-Capillary 190 (H) 70 - 99 mg/dL  Glucose, capillary     Status: Abnormal   Collection Time: 02/28/22  6:59 AM  Result Value Ref Range   Glucose-Capillary 187 (H) 70 - 99 mg/dL  Glucose, capillary     Status: Abnormal   Collection Time: 02/28/22  7:53 AM  Result Value Ref Range   Glucose-Capillary 187 (H) 70 - 99 mg/dL  Glucose, capillary     Status: Abnormal   Collection Time: 02/28/22 11:21 AM  Result Value Ref Range   Glucose-Capillary 177 (H) 70 - 99 mg/dL  Osmolality, urine     Status: None   Collection Time: 02/28/22 11:23 AM  Result Value Ref Range   Osmolality, Ur 354 300 - 900 mOsm/kg  Creatinine, urine, random     Status: None   Collection Time: 02/28/22 11:23 AM  Result Value Ref Range   Creatinine, Urine 168 mg/dL  Glucose, capillary     Status: Abnormal   Collection Time: 02/28/22 12:18 PM  Result Value Ref Range   Glucose-Capillary 180 (H) 70 - 99 mg/dL    OBJECTIVE:  Physical  Exam:  GEN- obese male sedated and intubated EARS-  dull TMs with fluid but no erythema or edema NOSE- purulent drainage bilaterally, right dobhoff tube in place with septal holder 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  Significant purulent  drainage was suctioned bilaterally.  A sample was sent for culture from right nostril.  A flexible endoscope was inserted into the patient's right nasal cavity.  This demonstrated seroupurulent secretions but mostly centered around the dobhoff tube in the nasal cavity.  These were suctioned showing mild edema and erythema of mucosa.  No necrosis or palor or eschar.  This was repeated on the patient's left side with again purulent secretions that were suctioned and visualization of the left middle meatus did not show the edema that was present yesterday and now appears symmetrical to the patient's right side.  No necrotic tissue or eschar was noted.    IMPRESSION:  Sinusitis, Serous otitis media, tongue edema and laceration  PLAN:  Overall patient's ENT picture has improved since yesterday.  No edema/erythema seen on left middle meatus that was present yesterday.  Continues to have thickened secretions and sinusitis but less copious today.  Continue to follow cultures but no further need for serial endoscopies at this time.  Routine trach care.  Anticipate some sinusitis to persist as long as dobhoff tube and septal holder are in place.  Samuel Chavez 02/28/2022, 1:15 PM

## 2022-02-28 NOTE — Progress Notes (Signed)
Recruitment maneuver performed Q4 with ventilator checks. PC 30, 10RR, 3s Ti, 100% FiO2, Peep 14 for 2 minutes. Patient tolerated well.

## 2022-02-28 NOTE — Progress Notes (Signed)
Pt's  PICC line became dislodged D/T dressing/lock no longer adhering to the skin R/T significant edema causing skin to weep. Line had moved so the decision was made with NP to remove it. Line removed without issue following removal protocol.

## 2022-03-01 ENCOUNTER — Inpatient Hospital Stay: Payer: Medicaid Other

## 2022-03-01 DIAGNOSIS — J9601 Acute respiratory failure with hypoxia: Secondary | ICD-10-CM | POA: Diagnosis not present

## 2022-03-01 DIAGNOSIS — J9602 Acute respiratory failure with hypercapnia: Secondary | ICD-10-CM | POA: Diagnosis not present

## 2022-03-01 LAB — BASIC METABOLIC PANEL
Anion gap: 12 (ref 5–15)
Anion gap: 11 (ref 5–15)
BUN: 121 mg/dL — ABNORMAL HIGH (ref 6–20)
BUN: 92 mg/dL — ABNORMAL HIGH (ref 6–20)
CO2: 23 mmol/L (ref 22–32)
CO2: 26 mmol/L (ref 22–32)
Calcium: 7.8 mg/dL — ABNORMAL LOW (ref 8.9–10.3)
Calcium: 7.7 mg/dL — ABNORMAL LOW (ref 8.9–10.3)
Chloride: 108 mmol/L (ref 98–111)
Chloride: 103 mmol/L (ref 98–111)
Creatinine, Ser: 4.79 mg/dL — ABNORMAL HIGH (ref 0.61–1.24)
Creatinine, Ser: 4.07 mg/dL — ABNORMAL HIGH (ref 0.61–1.24)
GFR, Estimated: 15 mL/min — ABNORMAL LOW (ref >60)
GFR, Estimated: 18 mL/min — ABNORMAL LOW (ref >60)
Glucose, Bld: 183 mg/dL — ABNORMAL HIGH (ref 70–99)
Glucose, Bld: 169 mg/dL — ABNORMAL HIGH (ref 70–99)
Potassium: 6.3 mmol/L (ref 3.5–5.1)
Potassium: 5.4 mmol/L — ABNORMAL HIGH (ref 3.5–5.1)
Sodium: 143 mmol/L (ref 135–145)
Sodium: 140 mmol/L (ref 135–145)

## 2022-03-01 LAB — CBC WITH DIFFERENTIAL/PLATELET
Abs Immature Granulocytes: 0.07 10*3/uL (ref 0.00–0.07)
Basophils Absolute: 0 10*3/uL (ref 0.0–0.1)
Basophils Relative: 0 %
Eosinophils Absolute: 0.3 10*3/uL (ref 0.0–0.5)
Eosinophils Relative: 2 %
HCT: 30.4 % — ABNORMAL LOW (ref 39.0–52.0)
Hemoglobin: 8.6 g/dL — ABNORMAL LOW (ref 13.0–17.0)
Immature Granulocytes: 1 %
Lymphocytes Relative: 6 %
Lymphs Abs: 0.9 10*3/uL (ref 0.7–4.0)
MCH: 26.8 pg (ref 26.0–34.0)
MCHC: 28.3 g/dL — ABNORMAL LOW (ref 30.0–36.0)
MCV: 94.7 fL (ref 80.0–100.0)
Monocytes Absolute: 0.6 10*3/uL (ref 0.1–1.0)
Monocytes Relative: 4 %
Neutro Abs: 12.4 10*3/uL — ABNORMAL HIGH (ref 1.7–7.7)
Neutrophils Relative %: 87 %
Platelets: 187 10*3/uL (ref 150–400)
RBC: 3.21 MIL/uL — ABNORMAL LOW (ref 4.22–5.81)
RDW: 15.9 % — ABNORMAL HIGH (ref 11.5–15.5)
WBC: 14.4 10*3/uL — ABNORMAL HIGH (ref 4.0–10.5)
nRBC: 0 % (ref 0.0–0.2)

## 2022-03-01 LAB — RENAL FUNCTION PANEL
Albumin: 1.9 g/dL — ABNORMAL LOW (ref 3.5–5.0)
Anion gap: 11 (ref 5–15)
BUN: 128 mg/dL — ABNORMAL HIGH (ref 6–20)
CO2: 24 mmol/L (ref 22–32)
Calcium: 7.8 mg/dL — ABNORMAL LOW (ref 8.9–10.3)
Chloride: 105 mmol/L (ref 98–111)
Creatinine, Ser: 5.18 mg/dL — ABNORMAL HIGH (ref 0.61–1.24)
GFR, Estimated: 13 mL/min — ABNORMAL LOW (ref >60)
Glucose, Bld: 191 mg/dL — ABNORMAL HIGH (ref 70–99)
Phosphorus: 11.3 mg/dL — ABNORMAL HIGH (ref 2.5–4.6)
Potassium: 7 mmol/L (ref 3.5–5.1)
Sodium: 140 mmol/L (ref 135–145)

## 2022-03-01 LAB — BLOOD GAS, ARTERIAL
Acid-base deficit: 3.7 mmol/L — ABNORMAL HIGH (ref 0.0–2.0)
Bicarbonate: 23.5 mmol/L (ref 20.0–28.0)
FIO2: 100 %
MECHVT: 460 mL
Mechanical Rate: 30
O2 Saturation: 98.9 %
PEEP: 18 cmH2O
Patient temperature: 37
pCO2 arterial: 50 mmHg — ABNORMAL HIGH (ref 32–48)
pH, Arterial: 7.28 — ABNORMAL LOW (ref 7.35–7.45)
pO2, Arterial: 91 mmHg (ref 83–108)

## 2022-03-01 LAB — MAGNESIUM
Magnesium: 3.2 mg/dL — ABNORMAL HIGH (ref 1.7–2.4)
Magnesium: 3.3 mg/dL — ABNORMAL HIGH (ref 1.7–2.4)

## 2022-03-01 LAB — PHOSPHORUS: Phosphorus: 10.1 mg/dL — ABNORMAL HIGH (ref 2.5–4.6)

## 2022-03-01 LAB — CBC
HCT: 30.7 % — ABNORMAL LOW (ref 39.0–52.0)
Hemoglobin: 8.9 g/dL — ABNORMAL LOW (ref 13.0–17.0)
MCH: 26.7 pg (ref 26.0–34.0)
MCHC: 29 g/dL — ABNORMAL LOW (ref 30.0–36.0)
MCV: 92.2 fL (ref 80.0–100.0)
Platelets: 208 10*3/uL (ref 150–400)
RBC: 3.33 MIL/uL — ABNORMAL LOW (ref 4.22–5.81)
RDW: 15.7 % — ABNORMAL HIGH (ref 11.5–15.5)
WBC: 16.2 10*3/uL — ABNORMAL HIGH (ref 4.0–10.5)
nRBC: 0 % (ref 0.0–0.2)

## 2022-03-01 LAB — GLUCOSE, CAPILLARY
Glucose-Capillary: 157 mg/dL — ABNORMAL HIGH (ref 70–99)
Glucose-Capillary: 159 mg/dL — ABNORMAL HIGH (ref 70–99)
Glucose-Capillary: 164 mg/dL — ABNORMAL HIGH (ref 70–99)
Glucose-Capillary: 166 mg/dL — ABNORMAL HIGH (ref 70–99)
Glucose-Capillary: 170 mg/dL — ABNORMAL HIGH (ref 70–99)
Glucose-Capillary: 171 mg/dL — ABNORMAL HIGH (ref 70–99)
Glucose-Capillary: 172 mg/dL — ABNORMAL HIGH (ref 70–99)
Glucose-Capillary: 173 mg/dL — ABNORMAL HIGH (ref 70–99)
Glucose-Capillary: 177 mg/dL — ABNORMAL HIGH (ref 70–99)
Glucose-Capillary: 180 mg/dL — ABNORMAL HIGH (ref 70–99)
Glucose-Capillary: 187 mg/dL — ABNORMAL HIGH (ref 70–99)
Glucose-Capillary: 189 mg/dL — ABNORMAL HIGH (ref 70–99)
Glucose-Capillary: 191 mg/dL — ABNORMAL HIGH (ref 70–99)
Glucose-Capillary: 195 mg/dL — ABNORMAL HIGH (ref 70–99)
Glucose-Capillary: 196 mg/dL — ABNORMAL HIGH (ref 70–99)
Glucose-Capillary: 200 mg/dL — ABNORMAL HIGH (ref 70–99)
Glucose-Capillary: 201 mg/dL — ABNORMAL HIGH (ref 70–99)
Glucose-Capillary: 220 mg/dL — ABNORMAL HIGH (ref 70–99)
Glucose-Capillary: 226 mg/dL — ABNORMAL HIGH (ref 70–99)

## 2022-03-01 LAB — POTASSIUM
Potassium: 6.5 mmol/L (ref 3.5–5.1)
Potassium: 6.6 mmol/L (ref 3.5–5.1)
Potassium: 5.6 mmol/L — ABNORMAL HIGH (ref 3.5–5.1)

## 2022-03-01 LAB — CULTURE, RESPIRATORY W GRAM STAIN

## 2022-03-01 LAB — NA AND K (SODIUM & POTASSIUM), RAND UR
Potassium Urine: 64 mmol/L
Sodium, Ur: 16 mmol/L

## 2022-03-01 LAB — UREA NITROGEN, URINE: Urea Nitrogen, Ur: 383 mg/dL

## 2022-03-01 LAB — C-REACTIVE PROTEIN: CRP: 33 mg/dL — ABNORMAL HIGH (ref <1.0)

## 2022-03-01 LAB — HEPATITIS B SURFACE ANTIGEN: Hepatitis B Surface Ag: NONREACTIVE

## 2022-03-01 LAB — HEPATITIS B CORE ANTIBODY, IGM: Hep B C IgM: NONREACTIVE

## 2022-03-01 MED ORDER — SODIUM BICARBONATE 8.4 % IV SOLN
50.0000 meq | Freq: Once | INTRAVENOUS | Status: AC
  Administered 2022-03-01: 50 meq via INTRAVENOUS
  Filled 2022-03-01: qty 50

## 2022-03-01 MED ORDER — LIDOCAINE HCL (PF) 1 % IJ SOLN: 5.0000 mL | INTRAMUSCULAR | Status: DC | PRN

## 2022-03-01 MED ORDER — FENTANYL CITRATE PF 50 MCG/ML IJ SOSY: 50.0000 ug | PREFILLED_SYRINGE | Freq: Once | INTRAMUSCULAR | Status: AC

## 2022-03-01 MED ORDER — ALTEPLASE 2 MG IJ SOLR: 2.0000 mg | Freq: Once | INTRAMUSCULAR | Status: DC | PRN

## 2022-03-01 MED ORDER — CALCIUM GLUCONATE-NACL 1-0.675 GM/50ML-% IV SOLN
1.0000 g | Freq: Once | INTRAVENOUS | Status: AC
  Administered 2022-03-01: 1000 mg via INTRAVENOUS
  Filled 2022-03-01: qty 50

## 2022-03-01 MED ORDER — PENTAFLUOROPROP-TETRAFLUOROETH EX AERO: 1.0000 | INHALATION_SPRAY | CUTANEOUS | Status: DC | PRN

## 2022-03-01 MED ORDER — ALBUTEROL SULFATE (2.5 MG/3ML) 0.083% IN NEBU
5.0000 mg | INHALATION_SOLUTION | Freq: Once | RESPIRATORY_TRACT | Status: AC
  Administered 2022-03-01: 5 mg via RESPIRATORY_TRACT
  Filled 2022-03-01: qty 6

## 2022-03-01 MED ORDER — FENTANYL CITRATE PF 50 MCG/ML IJ SOSY
PREFILLED_SYRINGE | INTRAMUSCULAR | Status: AC
  Administered 2022-03-01: 50 ug via INTRAVENOUS
  Filled 2022-03-01: qty 1

## 2022-03-01 MED ORDER — HEPARIN SODIUM (PORCINE) 1000 UNIT/ML DIALYSIS
1000.0000 [IU] | INTRAMUSCULAR | Status: DC | PRN
  Filled 2022-03-01: qty 1

## 2022-03-01 MED ORDER — CHLORHEXIDINE GLUCONATE CLOTH 2 % EX PADS
6.0000 | MEDICATED_PAD | Freq: Every day | CUTANEOUS | Status: DC
  Administered 2022-03-02: 6 via TOPICAL

## 2022-03-01 MED ORDER — SODIUM ZIRCONIUM CYCLOSILICATE 5 G PO PACK
10.0000 g | PACK | Freq: Three times a day (TID) | ORAL | Status: DC
  Administered 2022-03-01 – 2022-03-03 (×7): 10 g
  Filled 2022-03-01 (×7): qty 2

## 2022-03-01 MED ORDER — SODIUM CHLORIDE 0.9 % IV SOLN
20.0000 ug | Freq: Once | INTRAVENOUS | Status: AC
  Administered 2022-03-01: 20 ug via INTRAVENOUS
  Filled 2022-03-01: qty 5

## 2022-03-01 MED ORDER — ANTICOAGULANT SODIUM CITRATE 4% (200MG/5ML) IV SOLN: 5.0000 mL | Status: DC | PRN

## 2022-03-01 MED ORDER — LIDOCAINE-PRILOCAINE 2.5-2.5 % EX CREA: 1.0000 | TOPICAL_CREAM | CUTANEOUS | Status: DC | PRN

## 2022-03-01 NOTE — Plan of Care (Signed)
  Problem: Clinical Measurements: Goal: Diagnostic test results will improve Outcome: Not Progressing  K +level now is 6.5 .

## 2022-03-01 NOTE — Progress Notes (Signed)
NAME:  Samuel Chavez, MRN:  [id number], DOB:  03/08/1976, LOS: 18 ADMISSION DATE:  02/11/2022  History of Present Illness:  46 y.o male with significant PMH as below who presented to the ED with chief complaints of SOB and worsening bilateral lower extremities edema.   ED Course: In the emergency department, the temperature was 37.3C, the heart rate 99 beats/minute, the blood pressure 187/102 mm Hg, the respiratory rate 20 breaths/minute, and the oxygen saturation 89% on. He was noted to be drowsy but still protecting his airway and responding appropriately.   Pertinent  Medical History    Significant Hospital Events: Including procedures, antibiotic start and stop dates in addition to other pertinent events   7/25: Admitted to hospitalist service w/acute on chronic hypoxic hypercapnic resp. failure requiring BiPAP.Failed BiPAP and intubated. PCCM consulted 7/26: INTUBATED, difficlut to sedate, started KETAMINE INFUSION 7/27: severe hypoxia, PEEP increased to 15 but decreased back to 10 7/28: Persistent severe hypoxia, ventilator changes made 7/29: Persistent hypoxia, recruitment maneuvers instituted, ventilator changes made, empiric heparin as patient cannot be scanned query PE -02/17/22- patient is severely critically ill with bilateral multifocal pneumonia on sedation and paralysis with mechanical ventilation maximal settings.  He is at cusp of death, we met with daughter today and discussed severity of illness. Unable to perform CT due to severity of critical illness. Met with previous PCCM doc and discussed medical plan.  Patient had recruitment maneuvers last few days without improvement, on heparin gtt for possible PE.    02/18/22- patient continues to require maximal settings on ventilator despite good UOP.  He destaturated to <50% spO2 on MV today required BGV.   02/19/22- patient continues to require 100% FiO2 on maximal setting with high PEEP ladder for possible ARDS.  He was  diuresed and on steroids with development of AKI.  His CXR had slight interval improvement on right. We discussed case with vascular surgery regarding possible empiric tPA for PE.  02/20/22- patient was unable to get CT today due to continued severe hypoxemia.  Daughter and other family members at bedside we reviewed case and medical plan.  There seems to be some confusion from family as they had asked me to wake patient up and take off ventilator even though I had explained multiple times that he is at very high risk for death.  They laughed at my comments and seemed to think it was not true. We may still be able to do bronchoscopy but its high risk.  We have reduced IV infusions by switching some medications to OGT route.  02/21/22- patient is weaned to 60%FiO2.  Plan for possible bronch if patient is tolerating.  Family at bedside.  If able to we will obtain more imaging and VQ scan to rule out PE.  02/22/22- Patient weaned to 50% on PRVC.  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 CBC stable , CMP with improved GFR. Monitoring Na, pharmacy and renal following for electrolytes/renal function.  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  Overall marked improvement on ventilator over past 48h. Met with daughter at bedside reviewed medical plan.  02/24/22-Vent requirements continue to slowly improve, currently 40% FiO2 and 14 PEEP.  AKI continues to slowly improve, remains hyperkalemic with potassium of 5.8.  Placed back on Veltassa, Diurese x1.  Trach scheduled for tomorrow at 1:15 PM 02/25/22- Diurese with Lasix x1 dose. Trach scheduled for today. 02/26/22:  Remains mechanically ventilated via newly placed tracheostomy will attempt to wean ventilator settings today.  Perform WUA  02/26/22: Pt remains mechanically ventilated via tracheostomy vent settings: PEEP 14/FiO2 80% 02/27/22: Fevers, ID consulted. Repeating Tracheal  aspirate and UA.  Zosyn started 02/28/22: Persistent fevers, Tracheal aspirate with Staph aureus, start Linezolid.  Worsening Creatinine, considering Lasix gtt given high vent requirements (100% FiO2, 14 peep).  D/c fentanyl gtt and decrease oral benzos/narcotics 03/01/22: worsening anuric renal failure. HD line placed and started on hemodialysis.  Interim History / Subjective:  Started on a furosemide gtt yesterday with minimal response. Worsening hyperkalemia and metabolic acidosis. Dialysis catheter placed and he was started on hemodialysis.  Objective   Blood pressure 112/69, pulse 99, temperature 97.9 F (36.6 C), temperature source Axillary, resp. rate (!) 29, height $RemoveBe'5\' 9"'jajxPEsjO$  (1.753 m), weight (!) 196 kg, SpO2 93 %.    Vent Mode: PRVC FiO2 (%):  [90 %-100 %] 90 % Set Rate:  [30 bmp] 30 bmp Vt Set:  [460 mL] 460 mL PEEP:  [18 cmH20] 18 cmH20 Plateau Pressure:  [29 cmH20] 29 cmH20   Intake/Output Summary (Last 24 hours) at 03/01/2022 1710 Last data filed at 03/01/2022 1600 Gross per 24 hour  Intake 5575 ml  Output 86 ml  Net 5489 ml   Filed Weights   02/26/22 0500 02/27/22 0415 02/28/22 0429  Weight: (!) 199 kg (!) 198.5 kg (!) 196 kg    Examination: General: intubated and sedated. unresponsive HEENT: [place]/AT, moist mucous membranes, sclera anicteric Neuro: non-responsive. sedated CV: rrr, s1s2, no murmurs PULM: decreased air entry on anterior auscultation. Intubated and mechanically ventilated GI: soft, non-tender, distended, BS+ Extremities: warm, +2 edema  Resolved Hospital Problem list     Assessment & Plan:   #ARDS #Acute Hypoxic Respiratory Failure #HAP   He has had worsening respiratory status over the past 48 to 72 hours with increasing oxygen requirements in the setting of pulmonary edema as well as hospital-acquired pneumonia.  His current cultures are growing Staph aureus (likely MRSA).  We we will have him on the low PEEP/ARDSnet table and increase his PEEP to  18 while he is maintained on 100% of FiO2.  Given his body habitus, will consider switching him to the high PEEP table.  We will optimize for low tidal volume ventilation and aim for a driving pressure of less than 15 cmH2O and a plateau of less than 25.   He was on broad spectrum antibiotics and respiratory cultures have grown MRSA. He is currently on Linezolid for HAP management and piperacillin/tazobactam was discontinued. He will also require aggressive volume management as I suspect a large component of his respiratory failure is secondary to volume overload. We attempted a furosemide gtt but his renal failure has worsened and he is anuric - he will require dialysis for volume removal.  -Full vent support, implement lung protective strategies -Plateau pressures less than 25 cm H20, driving pressure <62 -Wean FiO2 & PEEP as tolerated to maintain O2 sats >92% -Follow intermittent Chest X-ray & ABG as needed -unstable for SBT's -Implement VAP Bundle -Prn Bronchodilators  #Acute decompensated heart failure with reduced ejection fraction #HFmrEF   He has a history of decompensated heart failure (HFmrEF).  Most recent echocardiogram was performed on 16 February 2022 showing an LV ejection fraction of 40 to 45% with moderately dilated LV cavity and concentric hypertrophy.  On exam, he has anasarca with bilateral rails and crackles on lung auscultation.   This picture is highly  concerning for decompensated heart failure and pulmonary edema contributing to his decompensated pulmonary status for which he will require aggressive diuresis.  He failed furosemide gtt and will require hemodialysis for an overall negative balance.   -maintain MAP >65, vasopressors as needed -continuous cardiac monitoring    #AKI #Acute Renal Failure #HyperKalemia #Metabolic Acidosis   Patient developed an AKI in setting of diffuse anasarca, third spacing, and possibly intravascular depletion. He has progressed to anuric  failure despite avoidance of nephrotoxic medications and attempts at diuresis.  Given metabolic acidosis, hyperkalemia, anuria, anasarca, and respiratory failure, nephrology were consulted and have started him on hemodialysis. He has tolerated HD well with removal of 1 liter. Goal is to continue to remove volume to aid with his respiratory status.  #Toxic Metabolic Encephalopathy   Altered mental status in the setting of prolonged critical illness.  He has received continuous sedation for the duration of his hospitalization and given his unresponsiveness we have cut down and decreased analgo-sedation.  Suspect accumulation of metabolites in his adipose tissue with slow release.  Currently unstable for CT scan of the head given his profound hypoxia though this will be something we will consider as his condition stabilizes.  -minimize psychoactive medications   System based bundle:  Neuro: switched IV analgo-sedation to enterals. Large volume of distribution with accumulation of metabolites likely culprit Pulmonary: ARDS with pulmonary edema. Volume management with dialysis, antibiotics for HAP. Pplat < 25, driving pressure < 15. CXR PRN Cardiovascular: decompensated heart failure Renal: Renal failure, hyperkalemia, on hemodialysis now Endo: [place] gtt ID: HAP with MRSA, on Linezolid  Hem/Onc: Heparin for DVT prophy GI: tube feeds, PPI for prophy   Best Practice (right click and "Reselect all SmartList Selections" daily)   Diet/type: tubefeeds DVT prophylaxis: prophylactic heparin  GI prophylaxis: PPI Lines: Central line and Dialysis Catheter Foley:  Yes, and it is still needed Code Status:  full code Last date of multidisciplinary goals of care discussion [N/A]  Labs   CBC: Recent Labs  Lab 02/25/22 0309 02/26/22 0502 02/27/22 0416 02/28/22 0425 03/01/22 0520  WBC 16.2* 12.4* 12.5* 14.3* 14.4*  NEUTROABS 13.2* 10.4* 10.6* 12.7* 12.4*  HGB 9.6* 9.5* 9.1* 8.4* 8.6*  HCT  34.6* 34.0* 33.0* 30.0* 30.4*  MCV 94.5 96.0 96.8 96.5 94.7  PLT 227 227 227 204 147    Basic Metabolic Panel: Recent Labs  Lab 02/26/22 0502 02/27/22 0416 02/27/22 1640 02/27/22 1819 02/28/22 0425 02/28/22 2016 02/28/22 2318 03/01/22 0520 03/01/22 1042 03/01/22 1401  NA 154* 154* 142 151* 149* 142  --  143  --  140  K 5.0 5.3* 5.4* 5.8* 5.4* 6.4* 6.5* 6.3* 6.6* 7.0*  CL 121* 119* 112* 119* 117* 110  --  108  --  105  CO2 $Re'29 28 26 27 27 24  'mHk$ --  23  --  24  GLUCOSE 209* 177* 476* 239* 228* 194*  --  183*  --  191*  BUN 89* 81* 79* 90* 98* 111*  --  121*  --  128*  CREATININE 1.61* 1.65* 2.00* 2.08* 2.88* 4.15*  --  4.79*  --  5.18*  CALCIUM 8.1* 7.9* 7.4* 7.8* 7.5* 7.7*  --  7.8*  --  7.8*  MG 2.8* 2.8* 2.5*  --   --   --  3.2* 3.3*  --   --   PHOS 5.2* 5.3* 3.8  --   --   --   --  10.1*  --  11.3*  GFR: Estimated Creatinine Clearance: 31.1 mL/min (A) (by C-G formula based on SCr of 5.18 mg/dL (H)). Recent Labs  Lab 02/26/22 0502 02/27/22 0416 02/27/22 1640 02/28/22 0425 03/01/22 0520  PROCALCITON  --   --  0.17  --   --   WBC 12.4* 12.5*  --  14.3* 14.4*    Liver Function Tests: Recent Labs  Lab 03/01/22 1401  ALBUMIN 1.9*   No results for input(s): "LIPASE", "AMYLASE" in the last 168 hours. No results for input(s): "AMMONIA" in the last 168 hours.  ABG    Component Value Date/Time   PHART 7.28 (L) 03/01/2022 0445   PCO2ART 50 (H) 03/01/2022 0445   PO2ART 91 03/01/2022 0445   HCO3 23.5 03/01/2022 0445   TCO2 26 03/17/2007 1822   ACIDBASEDEF 3.7 (H) 03/01/2022 0445   O2SAT 98.9 03/01/2022 0445     Coagulation Profile: Recent Labs  Lab 02/25/22 0309 02/27/22 1210  INR 1.2 1.2    Cardiac Enzymes: No results for input(s): "CKTOTAL", "CKMB", "CKMBINDEX", "TROPONINI" in the last 168 hours.  HbA1C: Hgb A1c MFr Bld  Date/Time Value Ref Range Status  02/11/2022 10:29 AM 10.1 (H) 4.8 - 5.6 % Final    Comment:    (NOTE) Pre diabetes:           5.7%-6.4%  Diabetes:              >6.4%  Glycemic control for   <7.0% adults with diabetes     CBG: Recent Labs  Lab 03/01/22 1146 03/01/22 1222 03/01/22 1322 03/01/22 1440 03/01/22 1538  GLUCAP 187* 170* 159* 157* 164*    Review of Systems:   Unable to obtain  Past Medical History:  He,  has a past medical history of Diabetes mellitus without complication ([place]), Hypertension, and Pancreatitis.   Surgical History:   Past Surgical History:  Procedure Laterality Date   CHOLECYSTECTOMY     TRACHEOSTOMY TUBE PLACEMENT N/A 02/25/2022   Procedure: TRACHEOSTOMY;  Surgeon: Clyde Canterbury, MD;  Location: [facility] ORS;  Service: ENT;  Laterality: N/A;     Social History:   reports that he has been smoking cigarettes. He has been smoking an average of 1 pack per day. He has never used smokeless tobacco. He reports that he does not drink alcohol and does not use drugs.   Family History:  His family history is not on file.   Allergies Allergies  Allergen Reactions   Hydrocodone Swelling     Home Medications  Prior to Admission medications   Medication Sig Start Date End Date Taking? Authorizing Provider  losartan (COZAAR) 100 MG tablet Take 100 mg by mouth daily. 02/01/22  Yes [provider]  OZEMPIC, 0.25 OR 0.5 MG/DOSE, 2 MG/3ML SOPN SMARTSIG:0.25 Milligram(s) SUB-Q Once a Week 12/09/21  Yes [provider]  torsemide (DEMADEX) 20 MG tablet Take 20 mg by mouth daily. 12/18/21  Yes [provider]  Aspirin-Salicylamide-Caffeine (BC HEADACHE POWDER PO) Take 1 packet by mouth as needed (for pain).    [provider]  cephALEXin (KEFLEX) 500 MG capsule Take 1 capsule (500 mg total) by mouth 4 (four) times daily. Patient not taking: Reported on 02/11/2022 12/16/18   Davonna Belling, MD  cyclobenzaprine (FLEXERIL) 10 MG tablet Take 0.5-1 tablets (5-10 mg total) by mouth 2 (two) times daily as needed for muscle spasms. Patient not taking: Reported on  02/11/2022 03/12/18   Margarita Mail, PA-C  gabapentin (NEURONTIN) 600 MG tablet Take 600 mg by mouth at  bedtime. Patient not taking: Reported on 02/11/2022 10/13/21   [provider]  LABETALOL HCL PO Take by mouth. Patient not taking: Reported on 02/11/2022    [provider]  meloxicam (MOBIC) 15 MG tablet Take 1 tablet (15 mg total) by mouth daily. Take 1 daily with food. Patient not taking: Reported on 02/11/2022 03/12/18   Margarita Mail, PA-C  metFORMIN (GLUCOPHAGE) 500 MG tablet Take 500 mg by mouth 2 (two) times daily with a meal. Patient not taking: Reported on 02/11/2022    [provider]  METFORMIN HCL ER, MOD, PO Take by mouth. Patient not taking: Reported on 02/11/2022    [provider]  METOPROLOL SUCCINATE ER PO Take by mouth. Patient not taking: Reported on 02/11/2022    [provider]  oxyCODONE-acetaminophen (PERCOCET) 10-325 MG tablet Take 1 tablet by mouth See admin instructions. Take 1 tablet by mouth 4-5 times a day as needed for pain Patient not taking: Reported on 02/11/2022    [provider]  oxyCODONE-acetaminophen (PERCOCET/ROXICET) 5-325 MG tablet Take 1 tablet by mouth every 8 (eight) hours as needed for severe pain. Patient not taking: Reported on 02/11/2022 12/16/18   Davonna Belling, MD  predniSONE (DELTASONE) 20 MG tablet Take 2 tablets (40 mg total) by mouth daily. Patient not taking: Reported on 02/11/2022 04/04/15   Davonna Belling, MD     Critical care time: 45 min, not inclusive of separate time spent performing procedures.

## 2022-03-01 NOTE — Procedures (Addendum)
Dialysis Catheter Placement  Date: 03/01/2022 Time: 12:10 pm Physician: Armando Reichert, MD Indication: Hemodialysis, Hyperkalemia, acidemia Consent: Obtained from dauther Destinay  Risks of the procedure as well as the alternatives and risks of each were explained to the patient and/or caregiver.  Consent for the procedure was obtained and is signed in the bedside chart  Anesthesia 50 mcg of fentanyl    Timeout Verified patient identification, verified procedure, site/side was marked, verified correct patient position, special equipment/implants available, medications/allergies/relevant history reviewed, required imaging and test results available. Patient comfort was obtained.     Sterile Technique Maximal sterile technique including full sterile barrier drape, hand hygiene, sterile gown, sterile gloves, mask, hair covering, sterile ultrasound probe cover (if used).   Hand washing performed prior to starting the procedure.    Procedure Description Area of catheter insertion was cleaned with chlorhexidine and draped in sterile fashion.  With real-time ultrasound guidance a needle was placed in the right IJ with non-pulsating blood noted. A j-tip wire was placed through the needle into the vessel. The wire was confirmed to be in the IJ with ultrasound. Following this, the tract was serially dilated and the dialysis catheter was inserted over the wire. Easy flushing noted in all ports.  The catheter was sutured in place and sterile dressing was applied after a BIO-PATCh was placed at the entry site.  Ultrasound was used to visualize vasculature and guidance of needle.   Number of Attempts: 1 Complications:none  Estimated Blood Loss: none  Armando Reichert, MD [facility]

## 2022-03-01 NOTE — Progress Notes (Signed)
Tolerated recruit maneuver well.

## 2022-03-01 NOTE — Progress Notes (Signed)
Critical resulted at 2300 and recollected but lab having some technical issue not sure when recent  lab collection will be resulted. Will give shifting measures as ordered from the 2300 lab values.

## 2022-03-01 NOTE — Progress Notes (Signed)
Patient had large type 7 bowel movement. Tried replacing the rectal tube but unable to do so as pt has poor rectal tone.

## 2022-03-01 NOTE — Consult Note (Signed)
PHARMACY CONSULT NOTE  Pharmacy Consult for Electrolyte Monitoring and Replacement   Recent Labs: Potassium (mmol/L)  Date Value  03/01/2022 5.4 (H)   Magnesium (mg/dL)  Date Value  03/01/2022 3.3 (H)   Calcium (mg/dL)  Date Value  03/01/2022 7.7 (L)   Albumin (g/dL)  Date Value  03/01/2022 1.9 (L)   Phosphorus (mg/dL)  Date Value  03/01/2022 11.3 (H)   Sodium (mmol/L)  Date Value  03/01/2022 140   Assessment: Patient is a 46 y/o M with medical history including DM, HTN, pancreatitis, tobacco use disorder, systolic CHF, OSA, DM c/b diabetic neuropathy, lumbar radiculopathy, morbid obesity who is admitted with acute respiratory failure in setting of acute CHF and hypertensive urgency. Patient is currently intubated, sedated, and on mechanical ventilation in the ICU. Pharmacy consulted to assist with electrolyte monitoring and replacement as indicated.  Nutrition: Tube feeds at 70 mL/hr (~ 1.68 L/day) + free water 200 mL q4h (1.2 L/day)   Diuretics: Lasix drip started 8/11  MIVF: D5w at 50 cc/hr   Misc: DDAVP 4 mcg given 8/5 and 8/6  Nephrology consulted for renal dysfunction and to assist with hypernatremia management  Goal of Therapy:  Electrolytes within normal limits  Plan:  --Na 140   Management per PCCM and nephrology --K 5.4 ,   Lokelma 10 gm per tube TID ordered 8/12, lasix drip started 8/11 --Magnesium & phosphorous elevated in setting of renal dysfunction -K q4h  --Re-check electrolytes with AM labs tomorrow  Joie Hipps A  03/01/2022 7:05 PM

## 2022-03-01 NOTE — Progress Notes (Signed)
Dialized pt at bedside for 2.5 hrs with no issue. UF = 1000 ml Right IJ hd cath heparin locked, clamped and capped Vs temp 97.9; bp 122/69 84; hr 99, o2sat 99; rr 28 Report given to bedside R.N.

## 2022-03-01 NOTE — Consult Note (Signed)
PHARMACY CONSULT NOTE  Pharmacy Consult for Electrolyte Monitoring and Replacement   Recent Labs: Potassium (mmol/L)  Date Value  03/01/2022 6.3 (HH)   Magnesium (mg/dL)  Date Value  03/01/2022 3.3 (H)   Calcium (mg/dL)  Date Value  03/01/2022 7.8 (L)   Albumin (g/dL)  Date Value  02/11/2022 3.5   Phosphorus (mg/dL)  Date Value  03/01/2022 10.1 (H)   Sodium (mmol/L)  Date Value  03/01/2022 143   Assessment: Patient is a 46 y/o M with medical history including DM, HTN, pancreatitis, tobacco use disorder, systolic CHF, OSA, DM c/b diabetic neuropathy, lumbar radiculopathy, morbid obesity who is admitted with acute respiratory failure in setting of acute CHF and hypertensive urgency. Patient is currently intubated, sedated, and on mechanical ventilation in the ICU. Pharmacy consulted to assist with electrolyte monitoring and replacement as indicated.  Nutrition: Tube feeds at 70 mL/hr (~ 1.68 L/day) + free water 200 mL q4h (1.2 L/day)   Diuretics: Lasix drip started 8/11  MIVF: D5w at 50 cc/hr   Misc: DDAVP 4 mcg given 8/5 and 8/6  Nephrology consulted for renal dysfunction and to assist with hypernatremia management  Goal of Therapy:  Electrolytes within normal limits  Plan:  --Na 143, improved. Management per PCCM and nephrology --K 6.3, Scr worse.  Lokelma 10 gm per tube TID ordered 8/12, lasix drip started 8/11 --Magnesium & phosphorous elevated in setting of renal dysfunction -Calcium gluconate 1 gm ordered by MD for elevated K --Re-check electrolytes with AM labs tomorrow  Corene Resnick A  03/01/2022 7:53 AM

## 2022-03-01 NOTE — Progress Notes (Signed)
[facility]  PROGRESS NOTE   Subjective:   Patient seen in the ICU.  Events noted. Presently has hyperkalemia and the urine output is less than 10 cc/ per hour Family at bedside.  Objective:  Vital signs: Blood pressure 139/71, pulse 91, temperature 98.6 F (37 C), temperature source Oral, resp. rate (!) 24, height $RemoveBe'5\' 9"'xFllIAIYB$  (1.753 m), weight (!) 196 kg, SpO2 95 %.  Intake/Output Summary (Last 24 hours) at 03/01/2022 1129 Last data filed at 03/01/2022 1100 Gross per 24 hour  Intake 5587.88 ml  Output 376 ml  Net 5211.88 ml   Filed Weights   02/26/22 0500 02/27/22 0415 02/28/22 0429  Weight: (!) 199 kg (!) 198.5 kg (!) 196 kg     Physical Exam: General:  No acute distress  Head:  Normocephalic, atraumatic. Moist oral mucosal membranes  Eyes:  Anicteric  Neck:  Supple  Lungs:   Clear to auscultation, normal effort  Heart:  S1S2 no rubs  Abdomen:   Soft, nontender, bowel sounds present  Extremities: 2+ edema peripheral edema.  Neurologic: Unresponsive on the vent  Skin:  No lesions  Access:     Basic Metabolic Panel: Recent Labs  Lab 02/25/22 0309 02/25/22 1107 02/26/22 0502 02/27/22 0416 02/27/22 1640 02/27/22 1819 02/28/22 0425 02/28/22 2016 02/28/22 2318 03/01/22 0520  NA 150*   < > 154* 154* 142 151* 149* 142  --  143  K 4.7   < > 5.0 5.3* 5.4* 5.8* 5.4* 6.4* 6.5* 6.3*  CL 118*   < > 121* 119* 112* 119* 117* 110  --  108  CO2 27   < > $R'29 28 26 27 27 24  'RF$ --  23  GLUCOSE 179*   < > 209* 177* 476* 239* 228* 194*  --  183*  BUN 96*   < > 89* 81* 79* 90* 98* 111*  --  121*  CREATININE 1.72*   < > 1.61* 1.65* 2.00* 2.08* 2.88* 4.15*  --  4.79*  CALCIUM 8.1*   < > 8.1* 7.9* 7.4* 7.8* 7.5* 7.7*  --  7.8*  MG 2.8*  --  2.8* 2.8* 2.5*  --   --   --  3.2* 3.3*  PHOS 4.4  --  5.2* 5.3* 3.8  --   --   --   --  10.1*   < > = values in this interval not displayed.    CBC: Recent Labs  Lab 02/25/22 0309 02/26/22 0502 02/27/22 0416 02/28/22 0425  03/01/22 0520  WBC 16.2* 12.4* 12.5* 14.3* 14.4*  NEUTROABS 13.2* 10.4* 10.6* 12.7* 12.4*  HGB 9.6* 9.5* 9.1* 8.4* 8.6*  HCT 34.6* 34.0* 33.0* 30.0* 30.4*  MCV 94.5 96.0 96.8 96.5 94.7  PLT 227 227 227 204 187     Urinalysis: Recent Labs    02/27/22 1637  COLORURINE YELLOW*  LABSPEC 1.013  PHURINE 5.0  GLUCOSEU NEGATIVE  HGBUR LARGE*  BILIRUBINUR NEGATIVE  KETONESUR NEGATIVE  PROTEINUR 100*  NITRITE NEGATIVE  LEUKOCYTESUR NEGATIVE      Imaging: DG Chest Port 1 View  Result Date: 03/01/2022 CLINICAL DATA:  [id number]. Hypoxic respiratory failure with ventilator dependence and hypercarbia. EXAM: PORTABLE CHEST 1 VIEW COMPARISON:  Portable chest August 9. FINDINGS: 4:37 a.m. Tip of the tracheostomy cannula is 4.9 cm from the carina, previously 7 cm. Right PICC again terminates in the distal SVC. Feeding tube enters well into the stomach but the radiopaque tip is not filmed. Moderate to severe cardiac enlargement is again  noted. Still noted, there is mild perihilar vascular prominence and mild basilar interstitial edema with small pleural effusions. There is persistent patchy consolidation in the left lower lung field, improved aeration in the right base with mild haziness remaining, and increased bilateral mid perihilar opacities in the upper lobes which could be atelectasis or new areas of pneumonia. Thoracic cage is intact. The mediastinum is stable. No further changes. IMPRESSION: Improving opacities in the right base but with new increased opacity in the mid perihilar upper lobes, atelectasis versus pneumonia, with no change in the left basilar opacities, small pleural effusions and mild interstitial edema. Electronically Signed   By: Telford Nab M.D.   On: 03/01/2022 06:57   [place] EKG SITE RITE  Result Date: 02/28/2022 If Site Rite image not attached, placement could not be confirmed due to current cardiac rhythm.    Medications:    sodium chloride 250 mL (02/28/22 2214)    desmopressin (DDAVP) 20 mcg in sodium chloride 0.9 % 50 mL IVPB     dextrose 50 mL/hr at 03/01/22 1100   feeding supplement (VITAL 1.5 CAL) 70 mL/hr at 03/01/22 1100   furosemide (LASIX) 200 mg in dextrose 5 % 100 mL (2 mg/mL) infusion 10 mg/hr (03/01/22 1100)   insulin 8 Units/hr (03/01/22 1100)   linezolid (ZYVOX) IV 300 mL/hr at 03/01/22 1100   piperacillin-tazobactam (ZOSYN)  IV Stopped (03/01/22 0958)    artificial tears  1 Application Both Eyes R6E   Chlorhexidine Gluconate Cloth  6 each Topical Q0600   clonazepam  1 mg Per Tube BID   famotidine  20 mg Per Tube QHS   feeding supplement (PROSource TF)  90 mL Per Tube TID   free water  200 mL Per Tube Q4H   heparin injection (subcutaneous)  5,000 Units Subcutaneous Q8H   insulin starter kit- pen needles  1 kit Other Once   ipratropium-albuterol  3 mL Nebulization Q6H   living well with diabetes book   Does not apply Once   mupirocin ointment   Nasal BID   mouth rinse  15 mL Mouth Rinse Q2H   oxyCODONE  5 mg Per Tube Q6H   polyethylene glycol  17 g Per Tube Daily   senna-docusate  1 tablet Per Tube BID   sodium chloride flush  10-40 mL Intracatheter Q12H   sodium zirconium cyclosilicate  10 g Per Tube TID    Assessment/ Plan:     Principal Problem:   Acute respiratory failure with hypoxia and hypercarbia (HCC) Active Problems:   Acute on chronic systolic CHF (congestive heart failure) (HCC)   Hypertensive urgency   Type 2 diabetes mellitus with peripheral neuropathy (HCC)   Acute heart failure with preserved ejection fraction (HCC)   AKI (acute kidney injury) ([place])   Hospital-acquired pneumonia   ARDS (adult respiratory distress syndrome) ([place])  46 y.o. male with medical problems of poorly controlled diabetes, hypertension, systolic CHF, obstructive sleep apnea, obesity    admitted on 02/11/2022 for Acute respiratory failure (HCC) [J96.00] Respiratory failure (HCC) [J96.90] Peripheral edema [R60.9] Acute respiratory  failure with hypoxia and hypercapnia (HCC) [J96.01, J96.02]  #1: Acute kidney injury: Patient with worsening renal insufficiency.  Acute kidney injury is most likely secondary to acute tubular necrosis possibly due to hypotension, fever and sepsis.  His urine output is very low at this time.  We will initiate dialysis treatment.  Spoke to the patient's daughter at bedside and explained to her on the consequences.  She is agreeable  to initiate dialysis and signed the consent.  #2: Hyperkalemia: Patient has not responded to [place].  We will attempt dialysis treatments.  #3: Congestive heart failure/fluid overload: We will attempt fluid removal via dialysis today.  #4: Vent dependent respiratory failure: Patient is s/p tracheostomy.  #5: Sepsis: Patient is on linezolid.  Overall prognosis is very poor.  Patient's daughter understands the same.  We will continue supportive care.   LOS: [place], [facility] 8/12/202311:29 AM

## 2022-03-01 NOTE — Progress Notes (Signed)
Neuro: opens eyes, unable to follow commands or move under own power Resp: stable on vent/trach, tolerated O2 wean to 80% following dialysis CV: afebrile, vital signs stable, significant edema throughout GIGU: foley in place, flexi removed by night shift, no BM, tolerating feeds Skin: Left buttock cheek popped blister-foam placed, slight weeping Social: daughter at bedside throughout the day, all questions and concerns addressed  Events: Dialysis catheter successfully placed today, first round of HD complete and tolerated well

## 2022-03-02 DIAGNOSIS — J9601 Acute respiratory failure with hypoxia: Secondary | ICD-10-CM | POA: Diagnosis not present

## 2022-03-02 DIAGNOSIS — J9602 Acute respiratory failure with hypercapnia: Secondary | ICD-10-CM | POA: Diagnosis not present

## 2022-03-02 LAB — GLUCOSE, CAPILLARY
Glucose-Capillary: 132 mg/dL — ABNORMAL HIGH (ref 70–99)
Glucose-Capillary: 142 mg/dL — ABNORMAL HIGH (ref 70–99)
Glucose-Capillary: 145 mg/dL — ABNORMAL HIGH (ref 70–99)
Glucose-Capillary: 150 mg/dL — ABNORMAL HIGH (ref 70–99)
Glucose-Capillary: 162 mg/dL — ABNORMAL HIGH (ref 70–99)
Glucose-Capillary: 163 mg/dL — ABNORMAL HIGH (ref 70–99)
Glucose-Capillary: 163 mg/dL — ABNORMAL HIGH (ref 70–99)
Glucose-Capillary: 169 mg/dL — ABNORMAL HIGH (ref 70–99)
Glucose-Capillary: 170 mg/dL — ABNORMAL HIGH (ref 70–99)
Glucose-Capillary: 170 mg/dL — ABNORMAL HIGH (ref 70–99)
Glucose-Capillary: 170 mg/dL — ABNORMAL HIGH (ref 70–99)
Glucose-Capillary: 171 mg/dL — ABNORMAL HIGH (ref 70–99)
Glucose-Capillary: 177 mg/dL — ABNORMAL HIGH (ref 70–99)
Glucose-Capillary: 178 mg/dL — ABNORMAL HIGH (ref 70–99)
Glucose-Capillary: 182 mg/dL — ABNORMAL HIGH (ref 70–99)
Glucose-Capillary: 196 mg/dL — ABNORMAL HIGH (ref 70–99)

## 2022-03-02 LAB — CBC WITH DIFFERENTIAL/PLATELET
Abs Immature Granulocytes: 0.08 10*3/uL — ABNORMAL HIGH (ref 0.00–0.07)
Basophils Absolute: 0 10*3/uL (ref 0.0–0.1)
Basophils Relative: 0 %
Eosinophils Absolute: 0.2 10*3/uL (ref 0.0–0.5)
Eosinophils Relative: 1 %
HCT: 31.8 % — ABNORMAL LOW (ref 39.0–52.0)
Hemoglobin: 9 g/dL — ABNORMAL LOW (ref 13.0–17.0)
Immature Granulocytes: 1 %
Lymphocytes Relative: 5 %
Lymphs Abs: 0.8 10*3/uL (ref 0.7–4.0)
MCH: 27 pg (ref 26.0–34.0)
MCHC: 28.3 g/dL — ABNORMAL LOW (ref 30.0–36.0)
MCV: 95.5 fL (ref 80.0–100.0)
Monocytes Absolute: 0.7 10*3/uL (ref 0.1–1.0)
Monocytes Relative: 4 %
Neutro Abs: 15 10*3/uL — ABNORMAL HIGH (ref 1.7–7.7)
Neutrophils Relative %: 89 %
Platelets: 199 10*3/uL (ref 150–400)
RBC: 3.33 MIL/uL — ABNORMAL LOW (ref 4.22–5.81)
RDW: 15.7 % — ABNORMAL HIGH (ref 11.5–15.5)
WBC: 16.9 10*3/uL — ABNORMAL HIGH (ref 4.0–10.5)
nRBC: 0.1 % (ref 0.0–0.2)

## 2022-03-02 LAB — BLOOD CULTURE ID PANEL (REFLEXED) - BCID2

## 2022-03-02 LAB — BASIC METABOLIC PANEL
Anion gap: 11 (ref 5–15)
BUN: 106 mg/dL — ABNORMAL HIGH (ref 6–20)
CO2: 27 mmol/L (ref 22–32)
Calcium: 7.6 mg/dL — ABNORMAL LOW (ref 8.9–10.3)
Chloride: 101 mmol/L (ref 98–111)
Creatinine, Ser: 4.78 mg/dL — ABNORMAL HIGH (ref 0.61–1.24)
GFR, Estimated: 15 mL/min — ABNORMAL LOW (ref >60)
Glucose, Bld: 179 mg/dL — ABNORMAL HIGH (ref 70–99)
Potassium: 5.9 mmol/L — ABNORMAL HIGH (ref 3.5–5.1)
Sodium: 139 mmol/L (ref 135–145)

## 2022-03-02 LAB — BASIC METABOLIC PANEL WITH GFR
Anion gap: 11 (ref 5–15)
BUN: 82 mg/dL — ABNORMAL HIGH (ref 6–20)
CO2: 28 mmol/L (ref 22–32)
Calcium: 7.5 mg/dL — ABNORMAL LOW (ref 8.9–10.3)
Chloride: 96 mmol/L — ABNORMAL LOW (ref 98–111)
Creatinine, Ser: 3.95 mg/dL — ABNORMAL HIGH (ref 0.61–1.24)
GFR, Estimated: 18 mL/min — ABNORMAL LOW
Glucose, Bld: 152 mg/dL — ABNORMAL HIGH (ref 70–99)
Potassium: 4.6 mmol/L (ref 3.5–5.1)
Sodium: 135 mmol/L (ref 135–145)

## 2022-03-02 LAB — PHOSPHORUS: Phosphorus: 11.7 mg/dL — ABNORMAL HIGH (ref 2.5–4.6)

## 2022-03-02 LAB — POTASSIUM
Potassium: 5.2 mmol/L — ABNORMAL HIGH (ref 3.5–5.1)
Potassium: 5.9 mmol/L — ABNORMAL HIGH (ref 3.5–5.1)
Potassium: 6 mmol/L — ABNORMAL HIGH (ref 3.5–5.1)

## 2022-03-02 LAB — AEROBIC/ANAEROBIC CULTURE W GRAM STAIN (SURGICAL/DEEP WOUND)

## 2022-03-02 LAB — HEPATITIS B CORE ANTIBODY, TOTAL: Hep B Core Total Ab: NONREACTIVE

## 2022-03-02 LAB — HEPATITIS C ANTIBODY: HCV Ab: NONREACTIVE

## 2022-03-02 LAB — HEPATITIS B SURFACE ANTIBODY,QUALITATIVE: Hep B S Ab: NONREACTIVE

## 2022-03-02 LAB — C-REACTIVE PROTEIN: CRP: 27.7 mg/dL — ABNORMAL HIGH

## 2022-03-02 LAB — MAGNESIUM: Magnesium: 3 mg/dL — ABNORMAL HIGH (ref 1.7–2.4)

## 2022-03-02 MED ORDER — LIDOCAINE HCL (PF) 1 % IJ SOLN: 5.0000 mL | INTRAMUSCULAR | Status: DC | PRN

## 2022-03-02 MED ORDER — PENTAFLUOROPROP-TETRAFLUOROETH EX AERO
1.0000 | INHALATION_SPRAY | CUTANEOUS | Status: DC | PRN
Start: 2022-03-02 — End: 2022-03-15

## 2022-03-02 MED ORDER — CLONAZEPAM 0.5 MG PO TBDP
0.5000 mg | ORAL_TABLET | Freq: Two times a day (BID) | ORAL | Status: DC
  Administered 2022-03-02: 0.5 mg
  Filled 2022-03-02: qty 1

## 2022-03-02 MED ORDER — OXYCODONE HCL 5 MG PO TABS: 5.0000 mg | ORAL_TABLET | Freq: Four times a day (QID) | ORAL | Status: DC | PRN

## 2022-03-02 MED ORDER — ANTICOAGULANT SODIUM CITRATE 4% (200MG/5ML) IV SOLN: 5.0000 mL | Status: DC | PRN

## 2022-03-02 MED ORDER — ALTEPLASE 2 MG IJ SOLR: 2.0000 mg | Freq: Once | INTRAMUSCULAR | Status: DC | PRN

## 2022-03-02 MED ORDER — INSULIN ASPART 100 UNIT/ML IV SOLN
10.0000 [IU] | Freq: Once | INTRAVENOUS | Status: AC
Start: 2022-03-02 — End: 2022-03-02
  Administered 2022-03-02: 10 [IU] via INTRAVENOUS
  Filled 2022-03-02: qty 0.1

## 2022-03-02 MED ORDER — LIDOCAINE-PRILOCAINE 2.5-2.5 % EX CREA: 1.0000 | TOPICAL_CREAM | CUTANEOUS | Status: DC | PRN

## 2022-03-02 MED ORDER — HEPARIN SODIUM (PORCINE) 1000 UNIT/ML DIALYSIS
1000.0000 [IU] | INTRAMUSCULAR | Status: DC | PRN
  Administered 2022-03-02: 2600 [IU]
  Filled 2022-03-02 (×4): qty 1

## 2022-03-02 MED ORDER — CALCIUM GLUCONATE-NACL 1-0.675 GM/50ML-% IV SOLN
1.0000 g | Freq: Once | INTRAVENOUS | Status: AC
  Administered 2022-03-02: 1000 mg via INTRAVENOUS
  Filled 2022-03-02: qty 50

## 2022-03-02 MED ORDER — DEXTROSE 50 % IV SOLN
1.0000 | Freq: Once | INTRAVENOUS | Status: AC
  Administered 2022-03-02: 50 mL via INTRAVENOUS
  Filled 2022-03-02: qty 50

## 2022-03-02 MED ORDER — CHLORHEXIDINE GLUCONATE CLOTH 2 % EX PADS: 6.0000 | MEDICATED_PAD | Freq: Every day | CUTANEOUS | Status: DC

## 2022-03-02 NOTE — Consult Note (Signed)
PHARMACY CONSULT NOTE  Pharmacy Consult for Electrolyte Monitoring and Replacement   Recent Labs: Potassium (mmol/L)  Date Value  03/02/2022 5.9 (H)   Magnesium (mg/dL)  Date Value  03/02/2022 3.0 (H)   Calcium (mg/dL)  Date Value  03/02/2022 7.6 (L)   Albumin (g/dL)  Date Value  03/01/2022 1.9 (L)   Phosphorus (mg/dL)  Date Value  03/02/2022 11.7 (H)   Sodium (mmol/L)  Date Value  03/02/2022 139   Assessment: Patient is a 46 y/o M with medical history including DM, HTN, pancreatitis, tobacco use disorder, systolic CHF, OSA, DM c/b diabetic neuropathy, lumbar radiculopathy, morbid obesity who is admitted with acute respiratory failure in setting of acute CHF and hypertensive urgency. Patient is currently intubated, sedated, and on mechanical ventilation in the ICU. Pharmacy consulted to assist with electrolyte monitoring and replacement as indicated.  Nutrition: Tube feeds at 70 mL/hr (~ 1.68 L/day) + free water 200 mL q4h (1.2 L/day)   Diuretics: Lasix drip started 8/11  MIVF: D5w at 50 cc/hr   Misc: DDAVP 4 mcg given 8/5 and 8/6  Nephrology consulted for renal dysfunction and to assist with hypernatremia management  Goal of Therapy:  Electrolytes within normal limits  Plan:  --Na 139   Management per PCCM and nephrology --K 5.9,   Lokelma 10 gm per tube TID ordered 8/12, lasix drip started 8/11 -pt received hemodialysis on 8/12  --Magnesium & phosphorous elevated in setting of renal dysfunction -BMP 5a&5p, K q4h --Re-check electrolytes with AM labs tomorrow  Alexcis Bicking A  03/02/2022 9:15 AM

## 2022-03-02 NOTE — Progress Notes (Signed)
Patient received no IV sedation today. Per tube pain and anxiety medication doses and frequencies adjusted by ICU team. Patient could open eyes throughout shift and may have been tracking RN during midday, but overall no observed ability to communicate, follow commands, or move limbs. Patient fluctuated between eyes open spontaneously, open to voice, and open to touch. Cough and gag reflex intact.

## 2022-03-02 NOTE — Progress Notes (Signed)
NAME:  Samuel Chavez, MRN:  [id number], DOB:  03/11/1976, LOS: 19 ADMISSION DATE:  02/11/2022  History of Present Illness:  46 y.o male with significant PMH as below who presented to the ED with chief complaints of SOB and worsening bilateral lower extremities edema.   ED Course: In the emergency department, the temperature was 37.3C, the heart rate 99 beats/minute, the blood pressure 187/102 mm Hg, the respiratory rate 20 breaths/minute, and the oxygen saturation 89% on. He was noted to be drowsy but still protecting his airway and responding appropriately.   Pertinent  Medical History   Past Medical History:  Diagnosis Date   Diabetes mellitus without complication (HCC)    Hypertension    Pancreatitis     Significant Hospital Events: Including procedures, antibiotic start and stop dates in addition to other pertinent events   7/25: Admitted to hospitalist service w/acute on chronic hypoxic hypercapnic resp. failure requiring BiPAP.Failed BiPAP and intubated. PCCM consulted 7/26: INTUBATED, difficlut to sedate, started KETAMINE INFUSION 7/27: severe hypoxia, PEEP increased to 15 but decreased back to 10 7/28: Persistent severe hypoxia, ventilator changes made 7/29: Persistent hypoxia, recruitment maneuvers instituted, ventilator changes made, empiric heparin as patient cannot be scanned query PE -02/17/22- patient is severely critically ill with bilateral multifocal pneumonia on sedation and paralysis with mechanical ventilation maximal settings.  He is at cusp of death, we met with daughter today and discussed severity of illness. Unable to perform CT due to severity of critical illness. Met with previous PCCM doc and discussed medical plan.  Patient had recruitment maneuvers last few days without improvement, on heparin gtt for possible PE.    02/18/22- patient continues to require maximal settings on ventilator despite good UOP.  He destaturated to <50% spO2 on MV today required BGV.    02/19/22- patient continues to require 100% FiO2 on maximal setting with high PEEP ladder for possible ARDS.  He was diuresed and on steroids with development of AKI.  His CXR had slight interval improvement on right. We discussed case with vascular surgery regarding possible empiric tPA for PE.  02/20/22- patient was unable to get CT today due to continued severe hypoxemia.  Daughter and other family members at bedside we reviewed case and medical plan.  There seems to be some confusion from family as they had asked me to wake patient up and take off ventilator even though I had explained multiple times that he is at very high risk for death.  They laughed at my comments and seemed to think it was not true. We may still be able to do bronchoscopy but its high risk.  We have reduced IV infusions by switching some medications to OGT route.  02/21/22- patient is weaned to 60%FiO2.  Plan for possible bronch if patient is tolerating.  Family at bedside.  If able to we will obtain more imaging and VQ scan to rule out PE.  02/22/22- Patient weaned to 50% on PRVC.  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 CBC stable , CMP with improved GFR. Monitoring Na, pharmacy and renal following for electrolytes/renal function.  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  Overall marked improvement on ventilator over past 48h. Met with daughter at bedside reviewed medical plan.  02/24/22-Vent requirements continue to slowly improve, currently 40% FiO2 and 14 PEEP.  AKI continues to slowly improve, remains hyperkalemic with potassium of 5.8.  Placed  back on Veltassa, Diurese x1.  Trach scheduled for tomorrow at 1:15 PM 02/25/22- Diurese with Lasix x1 dose. Trach scheduled for today. 02/26/22: Remains mechanically ventilated via newly placed tracheostomy will attempt to wean ventilator settings today.  Perform WUA  02/26/22: Pt remains  mechanically ventilated via tracheostomy vent settings: PEEP 14/FiO2 80% 02/27/22: Fevers, ID consulted. Repeating Tracheal aspirate and UA.  Zosyn started 02/28/22: Persistent fevers, Tracheal aspirate with Staph aureus, start Linezolid.  Worsening Creatinine, considering Lasix gtt given high vent requirements (100% FiO2, 14 peep).  D/c fentanyl gtt and decrease oral benzos/narcotics 03/01/22: worsening anuric renal failure. HD line placed and started on hemodialysis. 03/02/22: Vent support slowly improving. Plan for HD again today.  Consults:  PCCM Nephrology Vascular Surgery Palliative Care ENT Infectious Disease  Procedures:  7/25: Intubation 7/26: PICC triple-lumen, right basilic vein 7/31: Left radial arterial line  8/08: Size 8 proximal XLT [place] inserted 8/12: Right IJ HD catheter placed  Significant Diagnostic Tests:  7/25  Chest Xray:Chest x-ray showed cardiomegaly and pulmonary vascular congestion without overt pulmonary edema 7/25 Echocardiogram: difficult study, LVEF was estimated at 55 to 60%, dilated LV moderate LVH, grade 1 DD, enlargement of the right ventricle, left atrial size moderately dilated, right atrial size moderately dilated this is consistent with restrictive physiology (echo reading not congruous with prior echoes from [facility]) 7/29: Venous US BLE>>IMPRESSION: No lower extremity DVT. 7/30 Limited echo: LVEF 40 to 45% decreased LV function, no wall motion abnormalities, moderate dilation of the LV concentric LVH, right ventricular size moderately enlarged, right atrial enlargement, this is more consistent with prior [facility] scans 8/4: CT Head>>No acute intracranial abnormality. Paranasal sinusitis. Bilateral complete opacification of the mastoid air cells and inner ears, as can be seen in the setting of otitis media. Correlate with symptoms. 8/4: CT Chest/Abdomen/Pelvis>>Extensive ground-glass, alveolar and patchy dense infiltrates in  both lungs suggesting multifocal pneumonia. Part of this finding may suggest underlying pulmonary edema. Small bilateral pleural effusions are seen. Cardiomegaly. There is ectasia of the main pulmonary artery suggesting pulmonary arterial hypertension. There is no evidence of intestinal obstruction or pneumoperitoneum. There is no hydronephrosis. Appendix is not dilated. UB diverticula are seen in the colon without signs of focal diverticulitis. Severe degenerative changes are noted at L4-L5 level with significant interval worsening. Findings may be due to severe disc degeneration. If there is clinical suspicion for discitis or osteomyelitis, follow-up MRI may be considered 8/4: Lung V/Q>>Pulmonary embolism absent.  Micro Data:  7/25: SARS-CoV-2 PCR> negative 7/25: MRSA PCR>> NEG 7/27: Strep pneumoniae Ag>> negative 7/27: Legionella Ag>> negative 7/27: Mycoplasma>> <770 7/27: Sputum>> Strep pneumo,Haemophilus influenzae 7/30 Sputum cult >> negative 8/4: Tracheal aspirate>>negative 8/9: Tracheal aspirate>>MRSA 8/10: RVP>>negative 8/10: Right nasal Wound culture>>STAPHYLOCOCCUS AUREUS  8/10: MRSA PCR>> Positive 8/11: Blood culture 1/4>>Staphylococcus epidermidis MecA/C (suspect contaminant)  Antimicrobials:  Cefepime 7/27 >>7/31 Ceftriaxone 7/31>>8/4 Vancomycin 7/27 >> 7/29, restarted 7/30 (resumed due to fever spike on Maxipime) ? Resistant Strep pneumo >> 7/31 Zosyn 8/10>> Linezolid 8/11>>  Interim History / Subjective:  -Tolerated HD well yesterday ~ 1L fluid removed -No significant events noted overnight -Afebrile, hemodynamically stable, no vasopressors -Vent support slowly improving ~ weaned to 70% fiO2 & 18 PEEP -Plan for HD again today due to persistent hyperkalemia and volume status  Objective   Blood pressure 123/66, pulse 98, temperature 98.8 F (37.1 C), temperature source Oral, resp. rate (!) 30, height 5\' 9"  (1.753 m), weight (!) 197.4 kg, SpO2 91 %. CVP:  [17 mmHg-22  mmHg] 22 mmHg  Vent Mode: PRVC FiO2 (%):  [70 %-90 %] 70 % Set Rate:  [30 bmp] 30 bmp Vt Set:  [460 mL] 460 mL PEEP:  [18 cmH20] 18 cmH20 Plateau Pressure:  [29 cmH20] 29 cmH20   Intake/Output Summary (Last 24 hours) at 03/02/2022 0824 Last data filed at 03/02/2022 0800 Gross per 24 hour  Intake 4681.54 ml  Output 1074 ml  Net 3607.54 ml    Filed Weights   02/27/22 0415 02/28/22 0429 03/02/22 0155  Weight: (!) 198.5 kg (!) 196 kg (!) 197.4 kg    Examination: General: Acute on chronically ill appearing male, laying in bed, tracheostomy in place, in NAD HEENT: [place]/AT, moist mucous membranes, sclera anicteric Neuro: non-responsive. Sedated,  Pupils PERRL 2 mm brisk bilaterally CV: rrr, s1s2, no murmurs PULM: decreased air entry on anterior auscultation. Intubated and mechanically ventilated, even, nonlabored, synchronous with vent GI: soft, non-tender, distended, BS+ Extremities: warm, +2 edema  Resolved Hospital Problem list     Assessment & Plan:   Severe acute on chronic hypoxic & hypercapnic/ VENTILATOR DEPENDENT Respiratory Failure due to ARDS, CAP & acute decompensation of chronic heart failure, along with new MRSA HAP -Full vent support, implement lung protective strategies -Plateau pressures less than 30 cm H20 -Wean FiO2 & PEEP as tolerated to maintain O2 sats >88% -Follow intermittent Chest X-ray & ABG as needed -Spontaneous Breathing Trials when respiratory parameters met and mental status permits -Implement VAP Bundle -Prn Bronchodilators -ABX as above -Volume removal with HD  #Acute decompensated heart failure with reduced ejection fraction #HFmrEF  He has a history of decompensated heart failure (HFmrEF).  Most recent echocardiogram was performed on 16 February 2022 showing an LV ejection fraction of 40 to 45% with moderately dilated LV cavity and concentric hypertrophy.  On exam, he has anasarca with bilateral rails and crackles on lung auscultation.  -Continuous  cardiac monitoring -Maintain MAP >65 -Vasopressors as needed to maintain MAP goal ~ weaned off -Trend lactic acid until normalized -HS Troponin peaked at 24 -2D Echocardiogram shows diastolic dysfunction and enlarged left and right atria consistent with restrictive physiology -Volume removal with HD  Community-acquired pneumonia: Streptococcus pneumoniae and Haemophilus influenzae ~ TREATED MRSA Pneumonia (VAP) -Monitor fever curve -Trend WBC's & Procalcitonin -Follow cultures as above -ID following, appreciate input ~ Continue Linezolid & Zosyn pending cultures & sensitivities    #AKI #Acute Renal Failure #HyperKalemia #Metabolic Acidosis Patient developed an AKI in setting of diffuse anasarca, third spacing, and possibly intravascular depletion. He has progressed to anuric failure despite avoidance of nephrotoxic medications and attempts at diuresis. -Monitor I&O's / urinary output -Follow BMP -Ensure adequate renal perfusion -Avoid nephrotoxic agents as able -Replace electrolytes as indicated -Nephrology following, appreciate input ~ intermittent HD  #Toxic Metabolic Encephalopathy  Altered mental status in the setting of prolonged critical illness.  He has received continuous sedation for the duration of his hospitalization and given his unresponsiveness we have cut down and decreased analgo-sedation.  Suspect accumulation of metabolites in his adipose tissue with slow release.  Currently unstable for CT scan of the head given his profound hypoxia though this will be something we will consider as his condition stabilizes. -Maintain a RASS goal of 0 -Avoid sedating medications as able -Daily wake up assessment     System based bundle:  Neuro: switched IV analgo-sedation to enterals. Large volume of distribution with accumulation of metabolites likely culprit Pulmonary: ARDS with pulmonary edema. Volume management with dialysis, antibiotics for HAP. Pplat <  25, driving  pressure < 15. CXR PRN Cardiovascular: decompensated heart failure Renal: Renal failure, hyperkalemia, on hemodialysis now Endo: [place] gtt ID: HAP with MRSA, on Linezolid  Hem/Onc: Heparin for DVT prophy GI: tube feeds, PPI for prophy   Best Practice (right click and "Reselect all SmartList Selections" daily)   Diet/type: tubefeeds DVT prophylaxis: prophylactic heparin  GI prophylaxis: PPI Lines: Central line and Dialysis Catheter Foley:  Yes, and it is still needed Code Status:  full code Last date of multidisciplinary goals of care discussion [N/A]  8/13: Will update pt's daughter at bedside.  Labs   CBC: Recent Labs  Lab 02/26/22 0502 02/27/22 0416 02/28/22 0425 03/01/22 0520 03/01/22 1721 03/02/22 0313  WBC 12.4* 12.5* 14.3* 14.4* 16.2* 16.9*  NEUTROABS 10.4* 10.6* 12.7* 12.4*  --  15.0*  HGB 9.5* 9.1* 8.4* 8.6* 8.9* 9.0*  HCT 34.0* 33.0* 30.0* 30.4* 30.7* 31.8*  MCV 96.0 96.8 96.5 94.7 92.2 95.5  PLT 227 227 204 187 208 199     Basic Metabolic Panel: Recent Labs  Lab 02/27/22 0416 02/27/22 1640 02/27/22 1819 02/28/22 2016 02/28/22 2318 03/01/22 0520 03/01/22 1042 03/01/22 1401 03/01/22 1721 03/01/22 1953 03/01/22 2324 03/02/22 0313 03/02/22 0720  NA 154* 142   < > 142  --  143  --  140 140  --   --  139  --   K 5.3* 5.4*   < > 6.4* 6.5* 6.3*   < > 7.0* 5.4* 5.6* 5.2* 5.9* 5.9*  CL 119* 112*   < > 110  --  108  --  105 103  --   --  101  --   CO2 28 26   < > 24  --  23  --  24 26  --   --  27  --   GLUCOSE 177* 476*   < > 194*  --  183*  --  191* 169*  --   --  179*  --   BUN 81* 79*   < > 111*  --  121*  --  128* 92*  --   --  106*  --   CREATININE 1.65* 2.00*   < > 4.15*  --  4.79*  --  5.18* 4.07*  --   --  4.78*  --   CALCIUM 7.9* 7.4*   < > 7.7*  --  7.8*  --  7.8* 7.7*  --   --  7.6*  --   MG 2.8* 2.5*  --   --  3.2* 3.3*  --   --   --   --   --  3.0*  --   PHOS 5.3* 3.8  --   --   --  10.1*  --  11.3*  --   --   --  11.7*  --    < > =  values in this interval not displayed.    GFR: Estimated Creatinine Clearance: 33.9 mL/min (A) (by C-G formula based on SCr of 4.78 mg/dL (H)). Recent Labs  Lab 02/27/22 1640 02/28/22 0425 03/01/22 0520 03/01/22 1721 03/02/22 0313  PROCALCITON 0.17  --   --   --   --   WBC  --  14.3* 14.4* 16.2* 16.9*     Liver Function Tests: Recent Labs  Lab 03/01/22 1401  ALBUMIN 1.9*    No results for input(s): "LIPASE", "AMYLASE" in the last 168 hours. No results for input(s): "AMMONIA" in the last 168 hours.  ABG  Component Value Date/Time   PHART 7.28 (L) 03/01/2022 0445   PCO2ART 50 (H) 03/01/2022 0445   PO2ART 91 03/01/2022 0445   HCO3 23.5 03/01/2022 0445   TCO2 26 03/17/2007 1822   ACIDBASEDEF 3.7 (H) 03/01/2022 0445   O2SAT 98.9 03/01/2022 0445     Coagulation Profile: Recent Labs  Lab 02/25/22 0309 02/27/22 1210  INR 1.2 1.2     Cardiac Enzymes: No results for input(s): "CKTOTAL", "CKMB", "CKMBINDEX", "TROPONINI" in the last 168 hours.  HbA1C: Hgb A1c MFr Bld  Date/Time Value Ref Range Status  02/11/2022 10:29 AM 10.1 (H) 4.8 - 5.6 % Final    Comment:    (NOTE) Pre diabetes:          5.7%-6.4%  Diabetes:              >6.4%  Glycemic control for   <7.0% adults with diabetes     CBG: Recent Labs  Lab 03/02/22 0118 03/02/22 0326 03/02/22 0506 03/02/22 0618 03/02/22 0717  GLUCAP 170* 178* 196* 163* 132*     Review of Systems:   Unable to obtain to assess due to AMS  Past Medical History:  He,  has a past medical history of Diabetes mellitus without complication ([place]), Hypertension, and Pancreatitis.   Surgical History:   Past Surgical History:  Procedure Laterality Date   CHOLECYSTECTOMY     TRACHEOSTOMY TUBE PLACEMENT N/A 02/25/2022   Procedure: TRACHEOSTOMY;  Surgeon: Clyde Canterbury, MD;  Location: [facility] ORS;  Service: ENT;  Laterality: N/A;     Social History:   reports that he has been smoking cigarettes. He has been smoking an  average of 1 pack per day. He has never used smokeless tobacco. He reports that he does not drink alcohol and does not use drugs.   Family History:  His family history is not on file.   Allergies Allergies  Allergen Reactions   Hydrocodone Swelling     Home Medications  Prior to Admission medications   Medication Sig Start Date End Date Taking? Authorizing Provider  losartan (COZAAR) 100 MG tablet Take 100 mg by mouth daily. 02/01/22  Yes [provider]  OZEMPIC, 0.25 OR 0.5 MG/DOSE, 2 MG/3ML SOPN SMARTSIG:0.25 Milligram(s) SUB-Q Once a Week 12/09/21  Yes [provider]  torsemide (DEMADEX) 20 MG tablet Take 20 mg by mouth daily. 12/18/21  Yes [provider]  Aspirin-Salicylamide-Caffeine (BC HEADACHE POWDER PO) Take 1 packet by mouth as needed (for pain).    [provider]  cephALEXin (KEFLEX) 500 MG capsule Take 1 capsule (500 mg total) by mouth 4 (four) times daily. Patient not taking: Reported on 02/11/2022 12/16/18   Davonna Belling, MD  cyclobenzaprine (FLEXERIL) 10 MG tablet Take 0.5-1 tablets (5-10 mg total) by mouth 2 (two) times daily as needed for muscle spasms. Patient not taking: Reported on 02/11/2022 03/12/18   Margarita Mail, PA-C  gabapentin (NEURONTIN) 600 MG tablet Take 600 mg by mouth at bedtime. Patient not taking: Reported on 02/11/2022 10/13/21   [provider]  LABETALOL HCL PO Take by mouth. Patient not taking: Reported on 02/11/2022    [provider]  meloxicam (MOBIC) 15 MG tablet Take 1 tablet (15 mg total) by mouth daily. Take 1 daily with food. Patient not taking: Reported on 02/11/2022 03/12/18   Margarita Mail, PA-C  metFORMIN (GLUCOPHAGE) 500 MG tablet Take 500 mg by mouth 2 (two) times daily with a meal. Patient not taking: Reported on 02/11/2022    [provider]  METFORMIN HCL ER, MOD, PO Take by mouth. Patient not taking: Reported on 02/11/2022    [provider]  METOPROLOL  SUCCINATE ER PO Take by mouth. Patient not taking: Reported on 02/11/2022    [provider]  oxyCODONE-acetaminophen (PERCOCET) 10-325 MG tablet Take 1 tablet by mouth See admin instructions. Take 1 tablet by mouth 4-5 times a day as needed for pain Patient not taking: Reported on 02/11/2022    [provider]  oxyCODONE-acetaminophen (PERCOCET/ROXICET) 5-325 MG tablet Take 1 tablet by mouth every 8 (eight) hours as needed for severe pain. Patient not taking: Reported on 02/11/2022 12/16/18   Davonna Belling, MD  predniSONE (DELTASONE) 20 MG tablet Take 2 tablets (40 mg total) by mouth daily. Patient not taking: Reported on 02/11/2022 04/04/15   Davonna Belling, MD     Critical care time: 40 minutes    Darel Hong, AGACNP-BC [facility] Prefer epic messenger for cross cover needs If after hours, please call E-link

## 2022-03-02 NOTE — Progress Notes (Signed)
[facility]  PROGRESS NOTE   Subjective:   Patient seen in the ICU.  Unresponsive on the ventilator. Patient is still in positive fluid balance. Had 1 dialysis treatment last night.  Objective:  Vital signs: Blood pressure 119/67, pulse (!) 102, temperature 97.7 F (36.5 C), temperature source Axillary, resp. rate (!) 24, height $RemoveBe'5\' 9"'fCSuvnjIB$  (1.753 m), weight (!) 197.4 kg, SpO2 92 %.  Intake/Output Summary (Last 24 hours) at 03/02/2022 1345 Last data filed at 03/02/2022 1300 Gross per 24 hour  Intake 4407.3 ml  Output 1071 ml  Net 3336.3 ml   Filed Weights   02/27/22 0415 02/28/22 0429 03/02/22 0155  Weight: (!) 198.5 kg (!) 196 kg (!) 197.4 kg     Physical Exam: General:  No acute distress  Head:  Normocephalic, atraumatic. Moist oral mucosal membranes  Eyes:  Anicteric  Neck:  Supple  Lungs:   Clear to auscultation, normal effort  Heart:  S1S2 no rubs  Abdomen:   Soft, nontender, bowel sounds present  Extremities: 2+ peripheral edema.  Neurologic: Unresponsive on the vent  Skin:  No lesions  Access:     Basic Metabolic Panel: Recent Labs  Lab 02/27/22 0416 02/27/22 1640 02/27/22 1819 02/28/22 2016 02/28/22 2318 03/01/22 0520 03/01/22 1042 03/01/22 1401 03/01/22 1721 03/01/22 1953 03/01/22 2324 03/02/22 0313 03/02/22 0720 03/02/22 1143  NA 154* 142   < > 142  --  143  --  140 140  --   --  139  --   --   K 5.3* 5.4*   < > 6.4* 6.5* 6.3*   < > 7.0* 5.4* 5.6* 5.2* 5.9* 5.9* 6.0*  CL 119* 112*   < > 110  --  108  --  105 103  --   --  101  --   --   CO2 28 26   < > 24  --  23  --  24 26  --   --  27  --   --   GLUCOSE 177* 476*   < > 194*  --  183*  --  191* 169*  --   --  179*  --   --   BUN 81* 79*   < > 111*  --  121*  --  128* 92*  --   --  106*  --   --   CREATININE 1.65* 2.00*   < > 4.15*  --  4.79*  --  5.18* 4.07*  --   --  4.78*  --   --   CALCIUM 7.9* 7.4*   < > 7.7*  --  7.8*  --  7.8* 7.7*  --   --  7.6*  --   --   MG 2.8* 2.5*  --   --   3.2* 3.3*  --   --   --   --   --  3.0*  --   --   PHOS 5.3* 3.8  --   --   --  10.1*  --  11.3*  --   --   --  11.7*  --   --    < > = values in this interval not displayed.    CBC: Recent Labs  Lab 02/26/22 0502 02/27/22 0416 02/28/22 0425 03/01/22 0520 03/01/22 1721 03/02/22 0313  WBC 12.4* 12.5* 14.3* 14.4* 16.2* 16.9*  NEUTROABS 10.4* 10.6* 12.7* 12.4*  --  15.0*  HGB 9.5* 9.1* 8.4* 8.6* 8.9* 9.0*  HCT 34.0*  33.0* 30.0* 30.4* 30.7* 31.8*  MCV 96.0 96.8 96.5 94.7 92.2 95.5  PLT 227 227 204 187 208 199     Urinalysis: Recent Labs    02/27/22 1637  COLORURINE YELLOW*  LABSPEC 1.013  PHURINE 5.0  GLUCOSEU NEGATIVE  HGBUR LARGE*  BILIRUBINUR NEGATIVE  KETONESUR NEGATIVE  PROTEINUR 100*  NITRITE NEGATIVE  LEUKOCYTESUR NEGATIVE      Imaging: DG Chest Port 1 View  Result Date: 03/01/2022 CLINICAL DATA:  Central line placement EXAM: PORTABLE CHEST 1 VIEW COMPARISON:  03/01/2022 at 0434 hours FINDINGS: Interval placement of right IJ approach central venous catheter with distal tip terminating at the level the distal SVC. Tracheostomy tube, enteric tube, and right-sided PICC line remain in place. Stable cardiomegaly. Pulmonary vascular congestion with mild diffuse interstitial opacities, similar to prior. No large pleural fluid collection. No pneumothorax. IMPRESSION: 1. Interval placement of right IJ approach central venous catheter with distal tip terminating at the level the distal SVC. No pneumothorax. 2. Otherwise stable radiographic appearance of the chest. Electronically Signed   By: Davina Poke D.O.   On: 03/01/2022 13:35   DG Chest Port 1 View  Result Date: 03/01/2022 CLINICAL DATA:  [id number]. Hypoxic respiratory failure with ventilator dependence and hypercarbia. EXAM: PORTABLE CHEST 1 VIEW COMPARISON:  Portable chest August 9. FINDINGS: 4:37 a.m. Tip of the tracheostomy cannula is 4.9 cm from the carina, previously 7 cm. Right PICC again terminates in the  distal SVC. Feeding tube enters well into the stomach but the radiopaque tip is not filmed. Moderate to severe cardiac enlargement is again noted. Still noted, there is mild perihilar vascular prominence and mild basilar interstitial edema with small pleural effusions. There is persistent patchy consolidation in the left lower lung field, improved aeration in the right base with mild haziness remaining, and increased bilateral mid perihilar opacities in the upper lobes which could be atelectasis or new areas of pneumonia. Thoracic cage is intact. The mediastinum is stable. No further changes. IMPRESSION: Improving opacities in the right base but with new increased opacity in the mid perihilar upper lobes, atelectasis versus pneumonia, with no change in the left basilar opacities, small pleural effusions and mild interstitial edema. Electronically Signed   By: Telford Nab M.D.   On: 03/01/2022 06:57     Medications:    sodium chloride 250 mL (02/28/22 2214)   anticoagulant sodium citrate     anticoagulant sodium citrate     dextrose 30 mL/hr at 03/02/22 1300   feeding supplement (VITAL 1.5 CAL) 70 mL/hr at 03/02/22 1300   insulin 7.5 Units/hr (03/02/22 1300)   linezolid (ZYVOX) IV Stopped (03/02/22 1034)    Chlorhexidine Gluconate Cloth  6 each Topical Q0600   clonazepam  0.5 mg Per Tube BID   famotidine  20 mg Per Tube QHS   feeding supplement (PROSource TF)  90 mL Per Tube TID   free water  200 mL Per Tube Q4H   heparin injection (subcutaneous)  5,000 Units Subcutaneous Q8H   insulin starter kit- pen needles  1 kit Other Once   ipratropium-albuterol  3 mL Nebulization Q6H   living well with diabetes book   Does not apply Once   mupirocin ointment   Nasal BID   mouth rinse  15 mL Mouth Rinse Q2H   polyethylene glycol  17 g Per Tube Daily   senna-docusate  1 tablet Per Tube BID   sodium chloride flush  10-40 mL Intracatheter Q12H   sodium zirconium cyclosilicate  10 g Per Tube TID     Assessment/ Plan:     Principal Problem:   Acute respiratory failure with hypoxia and hypercarbia (HCC) Active Problems:   Acute on chronic systolic CHF (congestive heart failure) (HCC)   Hypertensive urgency   Type 2 diabetes mellitus with peripheral neuropathy (HCC)   Acute heart failure with preserved ejection fraction (HCC)   Renal failure (ARF), acute on chronic (HCC)   Hospital-acquired pneumonia   ARDS (adult respiratory distress syndrome) ([place])  46 y.o. male with medical problems of poorly controlled diabetes, hypertension, systolic CHF, obstructive sleep apnea, obesity    admitted on 02/11/2022 for Acute respiratory failure (HCC) [J96.00] Respiratory failure (HCC) [J96.90] Peripheral edema [R60.9] Acute respiratory failure with hypoxia and hypercapnia ([place]) [J96.01, J96.02]   #1: Acute kidney injury: Patient with worsening renal insufficiency.  Acute kidney injury is most likely secondary to acute tubular necrosis possibly due to hypotension, fever and sepsis.  Patient is oligoanuric with metabolic acidosis and hyperkalemia.  He was initiated on dialysis.  Patient continued to have hyperkalemia.  We will dialyze him again today with 1K bath and attempt to remove 2 L of fluid.    #2: Hyperkalemia: Patient has not responded to Veltassa or insulin with glucose.  We will attempt dialysis treatment to remove potassium today.   #3: Congestive heart failure/fluid overload: We will attempt fluid removal via dialysis today.   #4: Vent dependent respiratory failure: Patient is s/p tracheostomy.   #5: Sepsis: Patient is on linezolid.   Overall prognosis is very poor.  Patient's daughter understands the same.  We will continue supportive care.  Case discussed with ICU staff.   LOS: [place], [facility] 8/13/20231:45 PM

## 2022-03-02 NOTE — Progress Notes (Signed)
Dialized pt at bedside for 3.5 hrs with no issue. UF = 2000 ml Right IJ hd cath heparin locked, clamped and capped Vs temp 100.4; bp 117/64 84; hr 99, o2sat 94; rr 35 Report given to bedside R.N.

## 2022-03-02 NOTE — Progress Notes (Signed)
PHARMACY - PHYSICIAN COMMUNICATION CRITICAL VALUE ALERT - BLOOD CULTURE IDENTIFICATION (BCID)  Results for orders placed or performed during the hospital encounter of 02/11/22  SARS Coronavirus 2 by RT PCR (hospital order, performed in [facility] hospital lab) *cepheid single result test* Anterior Nasal Swab     Status: None   Collection Time: 02/11/22  4:33 PM   Specimen: Anterior Nasal Swab  Result Value Ref Range Status   SARS Coronavirus 2 by RT PCR NEGATIVE NEGATIVE Final    Comment: (NOTE) SARS-CoV-2 target nucleic acids are NOT DETECTED.  The SARS-CoV-2 RNA is generally detectable in upper and lower respiratory specimens during the acute phase of infection. The lowest concentration of SARS-CoV-2 viral copies this assay can detect is 250 copies / mL. A negative result does not preclude SARS-CoV-2 infection and should not be used as the sole basis for treatment or other patient management decisions.  A negative result may occur with improper specimen collection / handling, submission of specimen other than nasopharyngeal swab, presence of viral mutation(s) within the areas targeted by this assay, and inadequate number of viral copies (<250 copies / mL). A negative result must be combined with clinical observations, patient history, and epidemiological information.  Fact Sheet for Patients:   https://www.patel.info/  Fact Sheet for Healthcare Providers: https://hall.com/  This test is not yet approved or  cleared by the [place] FDA and has been authorized for detection and/or diagnosis of SARS-CoV-2 by FDA under an Emergency Use Authorization (EUA).  This EUA will remain in effect (meaning this test can be used) for the duration of the COVID-19 declaration under Section 564(b)(1) of the Act, 21 U.S.C. section 360bbb-3(b)(1), unless the authorization is terminated or revoked sooner.  Performed at [facility], [address] [place], [place] [address]   MRSA Next Gen by PCR, Nasal     Status: None   Collection Time: 02/11/22  5:48 PM   Specimen: Nasal Mucosa; Nasal Swab  Result Value Ref Range Status   MRSA by PCR Next Gen NOT DETECTED NOT DETECTED Final    Comment: (NOTE) The GeneXpert MRSA Assay (FDA approved for NASAL specimens only), is one component of a comprehensive MRSA colonization surveillance program. It is not intended to diagnose MRSA infection nor to guide or monitor treatment for MRSA infections. Test performance is not FDA approved in patients less than 76 years old. Performed at [facility], [address] [place], [place] [address]   Culture, Respiratory w Gram Stain     Status: None   Collection Time: 02/13/22  9:16 PM   Specimen: Tracheal Aspirate; Respiratory  Result Value Ref Range Status   Specimen Description   Final    TRACHEAL ASPIRATE Performed at [facility], [address] [place], [place] [address]    Special Requests   Final    NONE Performed at [facility], [address] [place], [place] [address]    Gram Stain   Final    NO SQUAMOUS EPITHELIAL CELLS SEEN FEW WBC SEEN FEW GRAM NEGATIVE RODS MODERATE GRAM POSITIVE COCCI    Culture   Final    ABUNDANT STREPTOCOCCUS PNEUMONIAE ABUNDANT HAEMOPHILUS INFLUENZAE BETA LACTAMASE NEGATIVE NO STAPHYLOCOCCUS AUREUS ISOLATED No Pseudomonas species isolated Performed at [facility], [place] [address] [place],  [address]    Report Status 02/17/2022 FINAL  Final   Organism ID, Bacteria STREPTOCOCCUS PNEUMONIAE  Final      Susceptibility   Streptococcus pneumoniae -  MIC*    ERYTHROMYCIN <=0.12 SENSITIVE Sensitive     LEVOFLOXACIN 0.5 SENSITIVE Sensitive     VANCOMYCIN 0.5 SENSITIVE Sensitive     PENICILLIN (meningitis) <=0.06 SENSITIVE Sensitive     PENO - penicillin <=0.06      PENICILLIN (non-meningitis) <=0.06 SENSITIVE Sensitive     PENICILLIN (oral)  <=0.06 SENSITIVE Sensitive     CEFTRIAXONE (non-meningitis) <=0.12 SENSITIVE Sensitive     CEFTRIAXONE (meningitis) <=0.12 SENSITIVE Sensitive     * ABUNDANT STREPTOCOCCUS PNEUMONIAE  Culture, Respiratory w Gram Stain     Status: None   Collection Time: 02/16/22  4:27 PM   Specimen: Tracheal Aspirate; Respiratory  Result Value Ref Range Status   Specimen Description   Final    TRACHEAL ASPIRATE Performed at [facility], [address] [place], [place] [address]    Special Requests   Final    NONE Performed at [facility], [address] [place], [place] [address]    Gram Stain   Final    RARE WBC PRESENT,BOTH PMN AND MONONUCLEAR NO ORGANISMS SEEN    Culture   Final    NO GROWTH Performed at [facility], [place] [address] [place], [place] [address]    Report Status 02/20/2022 FINAL  Final  Culture, Respiratory w Gram Stain     Status: None   Collection Time: 02/21/22 11:41 AM   Specimen: Bronchoalveolar Lavage; Respiratory  Result Value Ref Range Status   Specimen Description   Final    BRONCHIAL ALVEOLAR LAVAGE Performed at [facility], [address] [place], [place] [address]    Special Requests   Final    NONE Performed at [facility], [address] [place], [place] [address]    Gram Stain   Final    FEW WBC PRESENT,BOTH PMN AND MONONUCLEAR NO ORGANISMS SEEN    Culture   Final    NO GROWTH 2 DAYS Performed at [facility], [place] [address] [place], [place] [address]    Report Status 02/23/2022 FINAL  Final  Culture, Respiratory w Gram Stain     Status: None   Collection Time: 02/26/22  2:37 PM   Specimen: SPU; Respiratory  Result Value Ref Range Status   Specimen Description   Final    SPUTUM Performed at [facility], [address], [place], [place] [address]    Special Requests   Final    NONE Performed at [facility], [address] [place], [place] [address]    Gram Stain    Final    FEW GRAM POSITIVE COCCI IN PAIRS IN CLUSTERS FEW WBC PRESENT, PREDOMINANTLY PMN Performed at [facility], [place] [address] [place],  [address]    Culture FEW METHICILLIN RESISTANT STAPHYLOCOCCUS AUREUS  Final   Report Status 03/01/2022 FINAL  Final   Organism ID, Bacteria METHICILLIN RESISTANT STAPHYLOCOCCUS AUREUS  Final      Susceptibility   Methicillin resistant staphylococcus aureus - MIC*    CIPROFLOXACIN <=0.5 SENSITIVE Sensitive     ERYTHROMYCIN <=0.25 SENSITIVE Sensitive     GENTAMICIN <=0.5 SENSITIVE Sensitive     OXACILLIN >=4 RESISTANT Resistant     TETRACYCLINE <=1 SENSITIVE Sensitive     VANCOMYCIN 1 SENSITIVE Sensitive     TRIMETH/SULFA <=10 SENSITIVE Sensitive     CLINDAMYCIN <=0.25 SENSITIVE Sensitive     RIFAMPIN <=0.5 SENSITIVE Sensitive     Inducible Clindamycin NEGATIVE Sensitive     *  FEW METHICILLIN RESISTANT STAPHYLOCOCCUS AUREUS  Respiratory (~20 pathogens) panel by PCR     Status: None   Collection Time: 02/27/22  4:37 PM   Specimen: Nasopharyngeal Swab; Respiratory  Result Value Ref Range Status   Adenovirus NOT DETECTED NOT DETECTED Final   Coronavirus 229E NOT DETECTED NOT DETECTED Final    Comment: (NOTE) The Coronavirus on the Respiratory Panel, DOES NOT test for the novel  Coronavirus (2019 nCoV)    Coronavirus HKU1 NOT DETECTED NOT DETECTED Final   Coronavirus NL63 NOT DETECTED NOT DETECTED Final   Coronavirus OC43 NOT DETECTED NOT DETECTED Final   Metapneumovirus NOT DETECTED NOT DETECTED Final   Rhinovirus / Enterovirus NOT DETECTED NOT DETECTED Final   Influenza A NOT DETECTED NOT DETECTED Final   Influenza B NOT DETECTED NOT DETECTED Final   Parainfluenza Virus 1 NOT DETECTED NOT DETECTED Final   Parainfluenza Virus 2 NOT DETECTED NOT DETECTED Final   Parainfluenza Virus 3 NOT DETECTED NOT DETECTED Final   Parainfluenza Virus 4 NOT DETECTED NOT DETECTED Final   Respiratory Syncytial Virus NOT DETECTED NOT DETECTED  Final   Bordetella pertussis NOT DETECTED NOT DETECTED Final   Bordetella Parapertussis NOT DETECTED NOT DETECTED Final   Chlamydophila pneumoniae NOT DETECTED NOT DETECTED Final   Mycoplasma pneumoniae NOT DETECTED NOT DETECTED Final    Comment: Performed at [facility], [place]. [address] [place], [place] [address]  Aerobic/Anaerobic Culture w Gram Stain (surgical/deep wound)     Status: None (Preliminary result)   Collection Time: 02/27/22  5:07 PM   Specimen: Wound  Result Value Ref Range Status   Specimen Description WOUND  Final   Special Requests RIGHT NASAL CAVITY  Final   Gram Stain   Final    MODERATE GRAM POSITIVE COCCI IN PAIRS RARE GRAM NEGATIVE RODS RARE WBC PRESENT,BOTH PMN AND MONONUCLEAR    Culture   Final    ABUNDANT STAPHYLOCOCCUS AUREUS SUSCEPTIBILITIES TO FOLLOW HOLDING FOR POSSIBLE ANAEROBE Performed at [facility], [place] [address] [place], [place] [address]    Report Status PENDING  Incomplete  MRSA Next Gen by PCR, Nasal     Status: Abnormal   Collection Time: 02/27/22  6:19 PM   Specimen: Nasal Mucosa; Nasal Swab  Result Value Ref Range Status   MRSA by PCR Next Gen DETECTED (A) NOT DETECTED Final    Comment: RESULT CALLED TO, READ BACK BY AND VERIFIED WITH: Romilda Joy DEW 02/27/22 1928 MU (NOTE) The GeneXpert MRSA Assay (FDA approved for NASAL specimens only), is one component of a comprehensive MRSA colonization surveillance program. It is not intended to diagnose MRSA infection nor to guide or monitor treatment for MRSA infections. Test performance is not FDA approved in patients less than 81 years old. Performed at [facility], [address] [place], [place] [address]   Culture, blood (Routine X 2) w Reflex to ID Panel     Status: None (Preliminary result)   Collection Time: 02/28/22  7:55 PM   Specimen: BLOOD  Result Value Ref Range Status   Specimen Description BLOOD BLOOD LEFT HAND  Final   Special Requests   Final     BOTTLES DRAWN AEROBIC ONLY Blood Culture results may not be optimal due to an inadequate volume of blood received in culture bottles   Culture  Setup Time   Final    Organism ID to follow GRAM POSITIVE COCCI AEROBIC BOTTLE ONLY CRITICAL RESULT CALLED TO, READ BACK BY AND VERIFIED  WITH: NATHAM Kristan Brummitt @ [phone] ON 03/02/2022 BY CAF Performed at [facility], [address] [place], [place] [address]    Culture [facility] POSITIVE COCCI  Final   Report Status PENDING  Incomplete  Blood Culture ID Panel (Reflexed)     Status: Abnormal   Collection Time: 02/28/22  7:55 PM  Result Value Ref Range Status   Enterococcus faecalis NOT DETECTED NOT DETECTED Final   Enterococcus Faecium NOT DETECTED NOT DETECTED Final   Listeria monocytogenes NOT DETECTED NOT DETECTED Final   Staphylococcus species DETECTED (A) NOT DETECTED Final    Comment: CRITICAL RESULT CALLED TO, READ BACK BY AND VERIFIED WITH: Jerik Falletta @ 0044 ON 03/02/2022 BY CAF    Staphylococcus aureus (BCID) NOT DETECTED NOT DETECTED Final   Staphylococcus epidermidis DETECTED (A) NOT DETECTED Final    Comment: Methicillin (oxacillin) resistant coagulase negative staphylococcus. Possible blood culture contaminant (unless isolated from more than one blood culture draw or clinical case suggests pathogenicity). No antibiotic treatment is indicated for blood  culture contaminants. CRITICAL RESULT CALLED TO, READ BACK BY AND VERIFIED WITH: Silus Lanzo @ [phone] ON 03/02/2022 BY CAF    Staphylococcus lugdunensis NOT DETECTED NOT DETECTED Final   Streptococcus species NOT DETECTED NOT DETECTED Final   Streptococcus agalactiae NOT DETECTED NOT DETECTED Final   Streptococcus pneumoniae NOT DETECTED NOT DETECTED Final   Streptococcus pyogenes NOT DETECTED NOT DETECTED Final   A.calcoaceticus-baumannii NOT DETECTED NOT DETECTED Final   Bacteroides fragilis NOT DETECTED NOT DETECTED Final   Enterobacterales NOT DETECTED NOT DETECTED Final    Enterobacter cloacae complex NOT DETECTED NOT DETECTED Final   Escherichia coli NOT DETECTED NOT DETECTED Final   Klebsiella aerogenes NOT DETECTED NOT DETECTED Final   Klebsiella oxytoca NOT DETECTED NOT DETECTED Final   Klebsiella pneumoniae NOT DETECTED NOT DETECTED Final   Proteus species NOT DETECTED NOT DETECTED Final   Salmonella species NOT DETECTED NOT DETECTED Final   Serratia marcescens NOT DETECTED NOT DETECTED Final   Haemophilus influenzae NOT DETECTED NOT DETECTED Final   Neisseria meningitidis NOT DETECTED NOT DETECTED Final   Pseudomonas aeruginosa NOT DETECTED NOT DETECTED Final   Stenotrophomonas maltophilia NOT DETECTED NOT DETECTED Final   Candida albicans NOT DETECTED NOT DETECTED Final   Candida auris NOT DETECTED NOT DETECTED Final   Candida glabrata NOT DETECTED NOT DETECTED Final   Candida krusei NOT DETECTED NOT DETECTED Final   Candida parapsilosis NOT DETECTED NOT DETECTED Final   Candida tropicalis NOT DETECTED NOT DETECTED Final   Cryptococcus neoformans/gattii NOT DETECTED NOT DETECTED Final   Methicillin resistance mecA/C DETECTED (A) NOT DETECTED Final    Comment: CRITICAL RESULT CALLED TO, READ BACK BY AND VERIFIED WITH: Lyrik Dockstader @ [phone] ON 03/02/2022 BY CAF Performed at [facility], [address] [place], [place] [address]   Culture, blood (Routine X 2) w Reflex to ID Panel     Status: None (Preliminary result)   Collection Time: 02/28/22  8:16 PM   Specimen: BLOOD  Result Value Ref Range Status   Specimen Description BLOOD BLOOD LEFT WRIST  Final   Special Requests   Final    BOTTLES DRAWN AEROBIC AND ANAEROBIC Blood Culture adequate volume   Culture   Final    NO GROWTH < 12 HOURS Performed at [facility], [address] [place], [place] [address]    Report Status PENDING  Incomplete   BCID results: 1 of 4 bottles w/ Staph Epi, mecA/C detected.  Pt currently on Zyvox per prior wound & respiratory culture  results.  Name of provider contacted: B. Rust-Chester, NP   Changes to prescribed antibiotics required: No changes at this time.  Renda Rolls, PharmD, [facility] 03/02/2022 1:06 AM

## 2022-03-02 NOTE — Progress Notes (Signed)
K+ this AM=5.9. Ca2+  gluconate, 10 units insulin and Dextrose 50% given as ordered.

## 2022-03-03 DIAGNOSIS — N179 Acute kidney failure, unspecified: Secondary | ICD-10-CM | POA: Diagnosis not present

## 2022-03-03 DIAGNOSIS — N189 Chronic kidney disease, unspecified: Secondary | ICD-10-CM

## 2022-03-03 DIAGNOSIS — J15212 Pneumonia due to Methicillin resistant Staphylococcus aureus: Secondary | ICD-10-CM

## 2022-03-03 DIAGNOSIS — J9601 Acute respiratory failure with hypoxia: Secondary | ICD-10-CM | POA: Diagnosis not present

## 2022-03-03 DIAGNOSIS — J9602 Acute respiratory failure with hypercapnia: Secondary | ICD-10-CM | POA: Diagnosis not present

## 2022-03-03 LAB — BASIC METABOLIC PANEL
Anion gap: 15 (ref 5–15)
Anion gap: 15 (ref 5–15)
BUN: 98 mg/dL — ABNORMAL HIGH (ref 6–20)
BUN: 105 mg/dL — ABNORMAL HIGH (ref 6–20)
CO2: 22 mmol/L (ref 22–32)
CO2: 24 mmol/L (ref 22–32)
Calcium: 7.2 mg/dL — ABNORMAL LOW (ref 8.9–10.3)
Calcium: 7.3 mg/dL — ABNORMAL LOW (ref 8.9–10.3)
Chloride: 94 mmol/L — ABNORMAL LOW (ref 98–111)
Chloride: 96 mmol/L — ABNORMAL LOW (ref 98–111)
Creatinine, Ser: 4.78 mg/dL — ABNORMAL HIGH (ref 0.61–1.24)
Creatinine, Ser: 5.6 mg/dL — ABNORMAL HIGH (ref 0.61–1.24)
GFR, Estimated: 15 mL/min — ABNORMAL LOW (ref >60)
GFR, Estimated: 12 mL/min — ABNORMAL LOW (ref >60)
Glucose, Bld: 249 mg/dL — ABNORMAL HIGH (ref 70–99)
Glucose, Bld: 175 mg/dL — ABNORMAL HIGH (ref 70–99)
Potassium: 5.3 mmol/L — ABNORMAL HIGH (ref 3.5–5.1)
Potassium: 5.4 mmol/L — ABNORMAL HIGH (ref 3.5–5.1)
Sodium: 131 mmol/L — ABNORMAL LOW (ref 135–145)
Sodium: 135 mmol/L (ref 135–145)

## 2022-03-03 LAB — CULTURE, BLOOD (ROUTINE X 2)

## 2022-03-03 LAB — CBC WITH DIFFERENTIAL/PLATELET
Abs Immature Granulocytes: 0.07 10*3/uL (ref 0.00–0.07)
Basophils Absolute: 0 10*3/uL (ref 0.0–0.1)
Basophils Relative: 0 %
Eosinophils Absolute: 0.3 10*3/uL (ref 0.0–0.5)
Eosinophils Relative: 2 %
HCT: 28.6 % — ABNORMAL LOW (ref 39.0–52.0)
Hemoglobin: 8.1 g/dL — ABNORMAL LOW (ref 13.0–17.0)
Immature Granulocytes: 1 %
Lymphocytes Relative: 5 %
Lymphs Abs: 0.7 10*3/uL (ref 0.7–4.0)
MCH: 26.6 pg (ref 26.0–34.0)
MCHC: 28.3 g/dL — ABNORMAL LOW (ref 30.0–36.0)
MCV: 93.8 fL (ref 80.0–100.0)
Monocytes Absolute: 0.8 10*3/uL (ref 0.1–1.0)
Monocytes Relative: 6 %
Neutro Abs: 11.3 10*3/uL — ABNORMAL HIGH (ref 1.7–7.7)
Neutrophils Relative %: 86 %
Platelets: 198 10*3/uL (ref 150–400)
RBC: 3.05 MIL/uL — ABNORMAL LOW (ref 4.22–5.81)
RDW: 15.6 % — ABNORMAL HIGH (ref 11.5–15.5)
WBC: 13.1 10*3/uL — ABNORMAL HIGH (ref 4.0–10.5)
nRBC: 0.2 % (ref 0.0–0.2)

## 2022-03-03 LAB — BLOOD GAS, ARTERIAL
Acid-base deficit: 0.2 mmol/L (ref 0.0–2.0)
Bicarbonate: 27.4 mmol/L (ref 20.0–28.0)
FIO2: 50 %
MECHVT: 460 mL
O2 Saturation: 95.9 %
PEEP: 16 cmH2O
Patient temperature: 37
RATE: 30 resp/min
pCO2 arterial: 61 mmHg — ABNORMAL HIGH (ref 32–48)
pH, Arterial: 7.26 — ABNORMAL LOW (ref 7.35–7.45)
pO2, Arterial: 72 mmHg — ABNORMAL LOW (ref 83–108)

## 2022-03-03 LAB — GLUCOSE, CAPILLARY
Glucose-Capillary: 151 mg/dL — ABNORMAL HIGH (ref 70–99)
Glucose-Capillary: 153 mg/dL — ABNORMAL HIGH (ref 70–99)
Glucose-Capillary: 156 mg/dL — ABNORMAL HIGH (ref 70–99)
Glucose-Capillary: 158 mg/dL — ABNORMAL HIGH (ref 70–99)
Glucose-Capillary: 161 mg/dL — ABNORMAL HIGH (ref 70–99)
Glucose-Capillary: 163 mg/dL — ABNORMAL HIGH (ref 70–99)
Glucose-Capillary: 164 mg/dL — ABNORMAL HIGH (ref 70–99)
Glucose-Capillary: 166 mg/dL — ABNORMAL HIGH (ref 70–99)
Glucose-Capillary: 170 mg/dL — ABNORMAL HIGH (ref 70–99)
Glucose-Capillary: 170 mg/dL — ABNORMAL HIGH (ref 70–99)

## 2022-03-03 LAB — C-REACTIVE PROTEIN: CRP: 22.6 mg/dL — ABNORMAL HIGH (ref <1.0)

## 2022-03-03 LAB — POTASSIUM
Potassium: 4.8 mmol/L (ref 3.5–5.1)
Potassium: 5.1 mmol/L (ref 3.5–5.1)

## 2022-03-03 LAB — MAGNESIUM: Magnesium: 2.9 mg/dL — ABNORMAL HIGH (ref 1.7–2.4)

## 2022-03-03 LAB — PHOSPHORUS: Phosphorus: 10.8 mg/dL — ABNORMAL HIGH (ref 2.5–4.6)

## 2022-03-03 MED ORDER — HEPARIN SODIUM (PORCINE) 1000 UNIT/ML IJ SOLN
INTRAMUSCULAR | Status: AC
  Filled 2022-03-03: qty 4

## 2022-03-03 MED ORDER — RENA-VITE PO TABS
1.0000 | ORAL_TABLET | Freq: Every day | ORAL | Status: DC
  Administered 2022-03-03 – 2022-03-20 (×14): 1
  Filled 2022-03-03 (×15): qty 1

## 2022-03-03 MED ORDER — INSULIN ASPART 100 UNIT/ML IJ SOLN
0.0000 [IU] | INTRAMUSCULAR | Status: DC
  Administered 2022-03-03 – 2022-03-04 (×6): 4 [IU] via SUBCUTANEOUS
  Administered 2022-03-04 – 2022-03-07 (×15): 3 [IU] via SUBCUTANEOUS
  Administered 2022-03-07: 4 [IU] via SUBCUTANEOUS
  Administered 2022-03-07 – 2022-03-19 (×21): 3 [IU] via SUBCUTANEOUS
  Administered 2022-03-19: 4 [IU] via SUBCUTANEOUS
  Administered 2022-03-19 – 2022-03-22 (×10): 3 [IU] via SUBCUTANEOUS
  Administered 2022-03-22: 4 [IU] via SUBCUTANEOUS
  Administered 2022-03-22 – 2022-03-23 (×2): 3 [IU] via SUBCUTANEOUS
  Administered 2022-03-23: 2 [IU] via SUBCUTANEOUS
  Administered 2022-03-24 (×3): 3 [IU] via SUBCUTANEOUS
  Filled 2022-03-03 (×61): qty 1

## 2022-03-03 MED ORDER — INSULIN ASPART 100 UNIT/ML IJ SOLN
6.0000 [IU] | INTRAMUSCULAR | Status: DC
  Administered 2022-03-03 – 2022-03-05 (×11): 6 [IU] via SUBCUTANEOUS
  Filled 2022-03-03 (×11): qty 1

## 2022-03-03 MED ORDER — SODIUM CHLORIDE 0.9 % IV SOLN
3.0000 g | Freq: Two times a day (BID) | INTRAVENOUS | Status: DC
  Administered 2022-03-03 – 2022-03-06 (×7): 3 g via INTRAVENOUS
  Filled 2022-03-03: qty 8
  Filled 2022-03-03: qty 3
  Filled 2022-03-03: qty 8
  Filled 2022-03-03 (×2): qty 3
  Filled 2022-03-03: qty 8
  Filled 2022-03-03: qty 3

## 2022-03-03 MED ORDER — PIPERACILLIN-TAZOBACTAM IN DEX 2-0.25 GM/50ML IV SOLN
2.2500 g | Freq: Three times a day (TID) | INTRAVENOUS | Status: DC
  Filled 2022-03-03: qty 50

## 2022-03-03 MED ORDER — INSULIN DETEMIR 100 UNIT/ML ~~LOC~~ SOLN
25.0000 [IU] | Freq: Two times a day (BID) | SUBCUTANEOUS | Status: DC
  Administered 2022-03-03 – 2022-03-04 (×4): 25 [IU] via SUBCUTANEOUS
  Filled 2022-03-03 (×5): qty 0.25

## 2022-03-03 MED ORDER — FREE WATER
30.0000 mL | Status: DC
  Administered 2022-03-03 – 2022-03-12 (×55): 30 mL

## 2022-03-03 NOTE — Progress Notes (Addendum)
Nutrition Follow Up Note   DOCUMENTATION CODES:   Morbid obesity  INTERVENTION:   Continue Vital 1.5 _0 /hr + ProSource TF 94m TID via tube  Free water flushes 322mq4 hours   Regimen provides 2760kcal/day, 180g/day protein and 146370may of free water   Rena-vit daily via tube   NUTRITION DIAGNOSIS:   Inadequate oral intake related to inability to eat (pt sedated and ventilated) as evidenced by NPO status.  GOAL:   Provide needs based on ASPEN/SCCM guidelines -met   MONITOR:   Vent status, Labs, Weight trends, TF tolerance, Skin, I & O's  ASSESSMENT:   [age]o male with h/o COPD, CHF, HTN, DM and OSA who is admitted with CHF exacerbation, CAP and severe ARDS.  Pt s/p tracheostomy and NGT placement 8/8 Pt s/p new HD 8/12  Pt remains sedated and ventilated via trach. Pt tolerating tube feeds at goal rate via NGT. Pt with hyponatremia today; free water decreased. No HD planned for today. Per chart, pt remains ~100lbs over his UBW; pt +27.8L on his I & Os.   Medications reviewed and include: pepcid, heparin, insulin, unasyn, linezolid   Labs reviewed: Na 131(L), K 5.3(H), BUN 98(H), creat 4.78(H), P 10.8(H), Mg 2.9(H) Wbc- 13.1(H), Hgb 8.1(L), Hct 28.6(L) Cbgs- 153, 164, 163, 151, 161 x 24 hrs  Patient is currently intubated on ventilator support MV: 11.0 L/min Temp (24hrs), Avg:100.8 F (38.2 C), Min:99.9 F (37.7 C), Max:102 F (38.9 C)  Propofol: none   MAP- >65m49m  UOP- 37ml62miet Order:   Diet Order             Diet NPO time specified  Diet effective midnight                  EDUCATION NEEDS:   Not appropriate for education at this time  Skin:  Skin Assessment: Reviewed RN Assessment (tongue laceration)  Last BM:  8/14- type 6  Height:   Ht Readings from Last 1 Encounters:  02/28/22 _1  (1.753 m)    Weight:   Wt Readings from Last 1 Encounters:  03/02/22 (!) 197.4 kg    Ideal Body Weight:  72.7 kg  BMI:  Body mass  index is 64.27 kg/m.  Estimated Nutritional Needs:   Kcal:  2700-3000kcal/day  Protein:  182g/day protein  Fluid:  2.0L/day  CaseyKoleen DistanceRD, LDN Please refer to AMIONIowa Medical And Classification CenterRD and/or RD on-call/weekend/after hours pager

## 2022-03-03 NOTE — Progress Notes (Signed)
Date of Admission:  02/11/2022     ID: Samuel Chavez is a 46 y.o. male Principal Problem:   Acute respiratory failure with hypoxia and hypercarbia (HCC) Active Problems:   Acute on chronic systolic CHF (congestive heart failure) (HCC)   Hypertensive urgency   Type 2 diabetes mellitus with peripheral neuropathy (HCC)   Acute heart failure with preserved ejection fraction (HCC)   Renal failure (ARF), acute on chronic (HCC)   Hospital-acquired pneumonia   ARDS (adult respiratory distress syndrome) (HCC)    Subjective: Remains intubated Over weekend started dialysis Medications:   Chlorhexidine Gluconate Cloth  6 each Topical Q0600   famotidine  20 mg Per Tube QHS   feeding supplement (PROSource TF)  90 mL Per Tube TID   free water  30 mL Per Tube Q4H   heparin injection (subcutaneous)  5,000 Units Subcutaneous Q8H   insulin aspart  0-20 Units Subcutaneous Q4H   insulin aspart  6 Units Subcutaneous Q4H   insulin detemir  25 Units Subcutaneous BID   insulin starter kit- pen needles  1 kit Other Once   ipratropium-albuterol  3 mL Nebulization Q6H   living well with diabetes book   Does not apply Once   mupirocin ointment   Nasal BID   mouth rinse  15 mL Mouth Rinse Q2H   sodium chloride flush  10-40 mL Intracatheter Q12H    Objective: Vital signs in last 24 hours: Temp:  [97.7 F (36.5 C)-102 F (38.9 C)] 99.9 F (37.7 C) (08/14 1123) Pulse Rate:  [101-106] 104 (08/14 1000) Resp:  [24-35] 29 (08/14 1000) BP: (96-135)/(48-71) 109/56 (08/14 1000) SpO2:  [89 %-100 %] 91 % (08/14 1102) FiO2 (%):  [40 %-60 %] 50 % (08/14 1102)  LDA Rt PICC Dialysis cath  PHYSICAL EXAM:  General: intubated Opens eyes to stimulation Does not follow commands Head: Normocephalic, without obvious abnormality, atraumatic. Eyes: Conjunctivae clear, anicteric sclerae. Pupils are equal ENT rt NG tube Neck:  symmetrical, no adenopathy, thyroid: non tender no carotid bruit and no  JVD.  Lungs: b/l airentry Heart: Tachycardia Abdomen: Soft, non-tender,not distended. Bowel sounds normal. No masses Extremities: edematous Skin: No rashes or lesions. Or bruising Lymph: Cervical, supraclavicular normal. Neurologic: cannot be assessed Lab Results Recent Labs    03/02/22 0313 03/02/22 0720 03/02/22 1756 03/02/22 2344 03/03/22 0400 03/03/22 0511  WBC 16.9*  --   --   --   --  13.1*  HGB 9.0*  --   --   --   --  8.1*  HCT 31.8*  --   --   --   --  28.6*  NA 139  --  135  --   --  131*  K 5.9*   < > 4.6   < > 5.1 5.3*  CL 101  --  96*  --   --  94*  CO2 27  --  28  --   --  22  BUN 106*  --  82*  --   --  98*  CREATININE 4.78*  --  3.95*  --   --  4.78*   < > = values in this interval not displayed.   Liver Panel Recent Labs    03/01/22 1401  ALBUMIN 1.9*   Sedimentation Rate No results for input(s): "ESRSEDRATE" in the last 72 hours. C-Reactive Protein Recent Labs    03/02/22 0312 03/03/22 0511  CRP 27.7* 22.6*    Microbiology: Tracheal aspirate - culture-MRSA  Studies/Results: DG Chest Port 1 View  Result Date: 03/01/2022 CLINICAL DATA:  Central line placement EXAM: PORTABLE CHEST 1 VIEW COMPARISON:  03/01/2022 at 0434 hours FINDINGS: Interval placement of right IJ approach central venous catheter with distal tip terminating at the level the distal SVC. Tracheostomy tube, enteric tube, and right-sided PICC line remain in place. Stable cardiomegaly. Pulmonary vascular congestion with mild diffuse interstitial opacities, similar to prior. No large pleural fluid collection. No pneumothorax. IMPRESSION: 1. Interval placement of right IJ approach central venous catheter with distal tip terminating at the level the distal SVC. No pneumothorax. 2. Otherwise stable radiographic appearance of the chest. Electronically Signed   By: Davina Poke D.O.   On: 03/01/2022 13:35     Assessment/Plan: Acute  respiratory failure with hypercapnia, and hypoxia-  intubated on admisison in the ED and now has tracheostomy Contributing factors, OSA, COPD, CHF, obesity hypoventilation syndrome   Fever X 5 - post tracheostomy- no hematoma  Leucocytosis has improved  D.D ? Pneumonia due to MRSA Tongue injury VS   sinusitis PICC in place- site is clean RESp viral PCR Neg On linezolid Add unasyn for prevotella in nasal cultures  Recommend CT chest/ abd if fever persist    Pneumonia - Strep pneumo/hinfluenza in tracheal aspirate has been adequately treated.but now has MRSA- on linezolid If fever persist CT chest to look for empyema   Anemia ? __AKI- started dialysis Hypernatremia- resolved Hyperkalemia   Anasarca   _Chronic systolic heart failure- cardiology following  Discussed the management with care team

## 2022-03-03 NOTE — Consult Note (Addendum)
PHARMACY CONSULT NOTE  Pharmacy Consult for Electrolyte Monitoring and Replacement   Recent Labs: Potassium (mmol/L)  Date Value  03/03/2022 5.1   Magnesium (mg/dL)  Date Value  03/03/2022 2.9 (H)   Calcium (mg/dL)  Date Value  03/02/2022 7.5 (L)   Albumin (g/dL)  Date Value  03/01/2022 1.9 (L)   Phosphorus (mg/dL)  Date Value  03/03/2022 10.8 (H)   Sodium (mmol/L)  Date Value  03/02/2022 135   Assessment: Patient is a 46 y/o M with medical history including DM, HTN, pancreatitis, tobacco use disorder, systolic CHF, OSA, DM c/b diabetic neuropathy, lumbar radiculopathy, morbid obesity who is admitted with acute respiratory failure in setting of acute CHF and hypertensive urgency. Patient is currently intubated, sedated, and on mechanical ventilation in the ICU. Pharmacy consulted to assist with electrolyte monitoring and replacement as indicated.  Nutrition: Tube feeds at 70 mL/hr (~ 1.68 L/day) + free water 30 mL q4h (180 mL/day)   Nephrology consulted for renal dysfunction and to assist with hypernatremia management. HD started 8/12  Goal of Therapy:  Electrolytes within normal limits  Plan:  --Hypernatremia resolved, fluid management per PCCM and nephrology --Mild hyperkalemia, management per PCCM and nephrology --Magnesium & phosphorous elevated in setting of renal dysfunction --BMP ordered BID  Benita Gutter  03/03/2022 9:11 AM

## 2022-03-03 NOTE — Inpatient Diabetes Management (Signed)
Inpatient Diabetes Program Recommendations  AACE/ADA: New Consensus Statement on Inpatient Glycemic Control (2015)  Target Ranges:  Prepandial:   less than 140 mg/dL      Peak postprandial:   less than 180 mg/dL (1-2 hours)      Critically ill patients:  140 - 180 mg/dL    Latest Reference Range & Units 03/02/22 23:40 03/03/22 01:45 03/03/22 03:44 03/03/22 05:44 03/03/22 08:19  Glucose-Capillary 70 - 99 mg/dL 163 (H)  6.5 units/hr 166 (H)  7.5 units/hr 156 (H)  5.5 units/hr 161 (H)  6.5 units/hr 151 (H)  5 units/hr  (H): Data is abnormally high     Home DM Meds: Metformin 500 mg BID (NOT taking)        Ozempic 0.25 mg Qweek  Current Orders: IV Insulin Drip     MD- Note pt remains on the IV Insulin Drip.  Drip rates are finally less than 10 units/hr  Tube Feeds running 70cc/hr  When decision made to transition to SQ Insulin, please use Phase 3 ICU Glycemic Control Protocol Order set:  Drip rate at 8am was 5 units/hr: Levemir 24 units BID Novolog 8 units Q4 hours (TF coverage) Novolog 1-2-3 Q4 hours (GFR <18)    --Will follow patient during hospitalization--  Wyn Quaker RN, MSN, [place] Diabetes Coordinator Inpatient Glycemic Control Team Team Pager: [phone] (8a-5p)

## 2022-03-03 NOTE — Progress Notes (Signed)
NAME:  Samuel Chavez, MRN:  [id number], DOB:  Jun 21, 1976, LOS: 76 ADMISSION DATE:  02/11/2022  History of Present Illness:  46 y.o male with significant PMH as below who presented to the ED with chief complaints of SOB and worsening bilateral lower extremities edema.   ED Course: In the emergency department, the temperature was 37.3C, the heart rate 99 beats/minute, the blood pressure 187/102 mm Hg, the respiratory rate 20 breaths/minute, and the oxygen saturation 89% on. He was noted to be drowsy but still protecting his airway and responding appropriately.   Pertinent  Medical History   Past Medical History:  Diagnosis Date   Diabetes mellitus without complication ([place])    Hypertension    Pancreatitis     Significant Hospital Events: Including procedures, antibiotic start and stop dates in addition to other pertinent events   7/25: Admitted to hospitalist service w/acute on chronic hypoxic hypercapnic resp. failure requiring BiPAP.Failed BiPAP and intubated. PCCM consulted 7/26: INTUBATED, difficlut to sedate, started [place] 7/27: severe hypoxia, PEEP increased to 15 but decreased back to 10 7/28: Persistent severe hypoxia, ventilator changes made 7/29: Persistent hypoxia, recruitment maneuvers instituted, ventilator changes made, empiric heparin as patient cannot be scanned query PE -02/17/22- patient is severely critically ill with bilateral multifocal pneumonia on sedation and paralysis with mechanical ventilation maximal settings.  He is at cusp of death, we met with daughter today and discussed severity of illness. Unable to perform CT due to severity of critical illness. Met with previous PCCM doc and discussed medical plan.  Patient had recruitment maneuvers last few days without improvement, on heparin gtt for possible PE.    02/18/22- patient continues to require maximal settings on ventilator despite good UOP.  He destaturated to <50% spO2 on MV today required BGV.    02/19/22- patient continues to require 100% FiO2 on maximal setting with high PEEP ladder for possible ARDS.  He was diuresed and on steroids with development of AKI.  His CXR had slight interval improvement on right. We discussed case with vascular surgery regarding possible empiric tPA for PE.  02/20/22- patient was unable to get CT today due to continued severe hypoxemia.  Daughter and other family members at bedside we reviewed case and medical plan.  There seems to be some confusion from family as they had asked me to wake patient up and take off ventilator even though I had explained multiple times that he is at very high risk for death.  They laughed at my comments and seemed to think it was not true. We may still be able to do bronchoscopy but its high risk.  We have reduced IV infusions by switching some medications to OGT route.  02/21/22- patient is weaned to 60%FiO2.  Plan for possible bronch if patient is tolerating.  Family at bedside.  If able to we will obtain more imaging and VQ scan to rule out PE.  02/22/22- Patient weaned to 50% on PRVC.  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 CBC stable , CMP with improved GFR. Monitoring Na, pharmacy and renal following for electrolytes/renal function.  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  Overall marked improvement on ventilator over past 48h. Met with daughter at bedside reviewed medical plan.  02/24/22-Vent requirements continue to slowly improve, currently 40% FiO2 and 14 PEEP.  AKI continues to slowly improve, remains hyperkalemic with potassium of 5.8.  Placed  back on Veltassa, Diurese x1.  Trach scheduled for tomorrow at 1:15 PM 02/25/22- Diurese with Lasix x1 dose. Trach scheduled for today. 02/26/22: Remains mechanically ventilated via newly placed tracheostomy will attempt to wean ventilator settings today.  Perform WUA  02/26/22: Pt remains  mechanically ventilated via tracheostomy vent settings: PEEP 14/FiO2 80% 02/27/22: Fevers, ID consulted. Repeating Tracheal aspirate and UA.  Zosyn started 02/28/22: Persistent fevers, Tracheal aspirate with Staph aureus, start Linezolid.  Worsening Creatinine, considering Lasix gtt given high vent requirements (100% FiO2, 14 peep).  D/c fentanyl gtt and decrease oral benzos/narcotics 03/01/22: worsening anuric renal failure. HD line placed and started on hemodialysis. 03/02/22: Vent support slowly improving. Plan for HD again today.  Consults:  PCCM Nephrology Vascular Surgery Palliative Care ENT Infectious Disease  Procedures:  7/25: Intubation 7/26: PICC triple-lumen, right basilic vein 3/09: Left radial arterial line  8/08: Size 8 proximal XLT Sh[place] inserted 8/12: Right IJ HD catheter placed  Significant Diagnostic Tests:  7/25  Chest Xray:Chest x-ray showed cardiomegaly and pulmonary vascular congestion without overt pulmonary edema 7/25 Echocardiogram: difficult study, LVEF was estimated at 55 to 60%, dilated LV moderate LVH, grade 1 DD, enlargement of the right ventricle, left atrial size moderately dilated, right atrial size moderately dilated this is consistent with restrictive physiology (echo reading not congruous with prior echoes from [facility]) 7/29: Venous US BLE>>IMPRESSION: No lower extremity DVT. 7/30 Limited echo: LVEF 40 to 45% decreased LV function, no wall motion abnormalities, moderate dilation of the LV concentric LVH, right ventricular size moderately enlarged, right atrial enlargement, this is more consistent with prior [facility] scans 8/4: CT Head>>No acute intracranial abnormality. Paranasal sinusitis. Bilateral complete opacification of the mastoid air cells and inner ears, as can be seen in the setting of otitis media. Correlate with symptoms. 8/4: CT Chest/Abdomen/Pelvis>>Extensive ground-glass, alveolar and patchy dense infiltrates in  both lungs suggesting multifocal pneumonia. Part of this finding may suggest underlying pulmonary edema. Small bilateral pleural effusions are seen. Cardiomegaly. There is ectasia of the main pulmonary artery suggesting pulmonary arterial hypertension. There is no evidence of intestinal obstruction or pneumoperitoneum. There is no hydronephrosis. Appendix is not dilated. UB diverticula are seen in the colon without signs of focal diverticulitis. Severe degenerative changes are noted at L4-L5 level with significant interval worsening. Findings may be due to severe disc degeneration. If there is clinical suspicion for discitis or osteomyelitis, follow-up MRI may be considered 8/4: Lung V/Q>>Pulmonary embolism absent.  Micro Data:  7/25: SARS-CoV-2 PCR> negative 7/25: MRSA PCR>> NEG 7/27: Strep pneumoniae Ag>> negative 7/27: Legionella Ag>> negative 7/27: Mycoplasma>> <770 7/27: Sputum>> Strep pneumo,Haemophilus influenzae 7/30 Sputum cult >> negative 8/4: Tracheal aspirate>>negative 8/9: Tracheal aspirate>>MRSA 8/10: RVP>>negative 8/10: Right nasal Wound culture>>STAPHYLOCOCCUS AUREUS  8/10: MRSA PCR>> Positive 8/11: Blood culture 1/4>>Staphylococcus epidermidis MecA/C (suspect contaminant)  Antimicrobials:  Cefepime 7/27 >>7/31 Ceftriaxone 7/31>>8/4 Vancomycin 7/27 >> 7/29, restarted 7/30 (resumed due to fever spike on Maxipime) ? Resistant Strep pneumo >> 7/31 Zosyn 8/10>> Linezolid 8/11>>  Interim History / Subjective:  Remains severely encephalopathic On vent Febrile  Objective   Blood pressure (!) 116/49, pulse (!) 105, temperature (!) 101.3 F (38.5 C), temperature source Oral, resp. rate (!) 32, height $RemoveBe'5\' 9"'GsRtvyJuA$  (1.753 m), weight (!) 197.4 kg, SpO2 100 %. CVP:  [16 mmHg-22 mmHg] 20 mmHg  Vent Mode: PRVC FiO2 (%):  [40 %-70 %] 60 % Set Rate:  [30 bmp] 30 bmp Vt Set:  [460 mL] 460 mL PEEP:  [16 cmH20-18 cmH20]  [place] Pressure:  [27 cmH20] 27 cmH20   Intake/Output  Summary (Last 24 hours) at 03/03/2022 0840 Last data filed at 03/03/2022 0730 Gross per 24 hour  Intake 2101.69 ml  Output 2033 ml  Net 68.69 ml    Filed Weights   02/27/22 0415 02/28/22 0429 03/02/22 0155  Weight: (!) 198.5 kg (!) 196 kg (!) 197.4 kg    Examination: Obese man in NAD Pupils equal Grimaces to pain but no motor response +cough/gag Ext with ongoing edema Abd soft Lungs diminished bases Ext warm, heart sounds regular  BMP good after HD, K okay H/H slightly down, WBC improved No new chest imaging   Resolved Hospital Problem list   Community-acquired pneumonia: Streptococcus pneumoniae and Haemophilus influenzae ~ TREATED  Hypernatremia  Assessment & Plan:  Acute on chronic hypoxemic and hypercapneic respiratory failure now s/p tracheostomy  MRSA HCAP- on linezolid  Obesity cardiomyopathy, OHS/OSA  Acute renal failure with hyperkalemia now on iHD  DM2 with hyperglycemia- on D5W for hypernatremia plus insulin drip  Ongoing severe toxic encephalopathy- multiple weeks of high dose sedation plus renal failure; head CT benign; would hold all sedation and give more time for now.  - iHD per nephrology - Switch off D5W and insulin gtt to basal-bolus as ordered - Hold lokelma, BID BMP checks - Linezolid x 7 days - If not more awake by tomorrow will get EEG and MRI - Lung protective tidal volumes limiting driving pressures to < 15cm H2O as able - VAP prevention bundle - Post trach care per usual pathway  Best Practice (right click and "Reselect all SmartList Selections" daily)   Diet/type: tubefeeds DVT prophylaxis: prophylactic heparin  GI prophylaxis: PPI Lines: Central line and Dialysis Catheter Foley:  Yes, and it is still needed Code Status:  full code Last date of multidisciplinary goals of care discussion [pending]  Long term prognosis guarded  44 min cc time Erskine Emery MD PCCM

## 2022-03-03 NOTE — Progress Notes (Signed)
[facility]  PROGRESS NOTE   Subjective:   Patient seen and evaluated at the bedside Remains intubated on vent, 50% No sedation, no pressors Tube feeds remains 70ml.hr, Vital 1.5 Foley in place, 37ml   Objective:  Vital signs: Blood pressure (!) 109/56, pulse (!) 104, temperature 99.9 F (37.7 C), temperature source Oral, resp. rate (!) 29, height 5' 9" (1.753 m), weight (!) 197.4 kg, SpO2 91 %.  Intake/Output Summary (Last 24 hours) at 03/03/2022 1236 Last data filed at 03/03/2022 0730 Gross per 24 hour  Intake 984.55 ml  Output 2018 ml  Net -1033.45 ml    Filed Weights   02/27/22 0415 02/28/22 0429 03/02/22 0155  Weight: (!) 198.5 kg (!) 196 kg (!) 197.4 kg     Physical Exam: General:  No acute distress, resting comfortably  Head:  Normocephalic, atraumatic. Moist oral mucosal membranes  Eyes:  Anicteric  Lungs:   intubated  Heart:  S1S2 no rubs  Abdomen:   Soft, nontender, bowel sounds present  Extremities: 2+ peripheral edema.  Neurologic: Response to pain only  Skin:  No lesions  Access: Right IJ temp cath placed on 03/01/22    Basic Metabolic Panel: Recent Labs  Lab 02/27/22 1640 02/27/22 1819 02/28/22 2318 03/01/22 0520 03/01/22 1042 03/01/22 1401 03/01/22 1721 03/01/22 1953 03/02/22 0313 03/02/22 0720 03/02/22 1143 03/02/22 1756 03/02/22 2344 03/03/22 0400 03/03/22 0511  NA 142   < >  --  143  --  140 140  --  139  --   --  135  --   --  131*  K 5.4*   < > 6.5* 6.3*   < > 7.0* 5.4*   < > 5.9*   < > 6.0* 4.6 4.8 5.1 5.3*  CL 112*   < >  --  108  --  105 103  --  101  --   --  96*  --   --  94*  CO2 26   < >  --  23  --  24 26  --  27  --   --  28  --   --  22  GLUCOSE 476*   < >  --  183*  --  191* 169*  --  179*  --   --  152*  --   --  249*  BUN 79*   < >  --  121*  --  128* 92*  --  106*  --   --  82*  --   --  98*  CREATININE 2.00*   < >  --  4.79*  --  5.18* 4.07*  --  4.78*  --   --  3.95*  --   --  4.78*  CALCIUM 7.4*   < >   --  7.8*  --  7.8* 7.7*  --  7.6*  --   --  7.5*  --   --  7.2*  MG 2.5*  --  3.2* 3.3*  --   --   --   --  3.0*  --   --   --   --   --  2.9*  PHOS 3.8  --   --  10.1*  --  11.3*  --   --  11.7*  --   --   --   --   --  10.8*   < > = values in this interval not displayed.     CBC: Recent Labs    Lab 02/27/22 0416 02/28/22 0425 03/01/22 0520 03/01/22 1721 03/02/22 0313 03/03/22 0511  WBC 12.5* 14.3* 14.4* 16.2* 16.9* 13.1*  NEUTROABS 10.6* 12.7* 12.4*  --  15.0* 11.3*  HGB 9.1* 8.4* 8.6* 8.9* 9.0* 8.1*  HCT 33.0* 30.0* 30.4* 30.7* 31.8* 28.6*  MCV 96.8 96.5 94.7 92.2 95.5 93.8  PLT 227 204 187 208 199 198      Urinalysis: No results for input(s): "COLORURINE", "LABSPEC", "PHURINE", "GLUCOSEU", "HGBUR", "BILIRUBINUR", "KETONESUR", "PROTEINUR", "UROBILINOGEN", "NITRITE", "LEUKOCYTESUR" in the last 72 hours.  Invalid input(s): "APPERANCEUR"     Imaging: DG Chest Port 1 View  Result Date: 03/01/2022 CLINICAL DATA:  Central line placement EXAM: PORTABLE CHEST 1 VIEW COMPARISON:  03/01/2022 at 0434 hours FINDINGS: Interval placement of right IJ approach central venous catheter with distal tip terminating at the level the distal SVC. Tracheostomy tube, enteric tube, and right-sided PICC line remain in place. Stable cardiomegaly. Pulmonary vascular congestion with mild diffuse interstitial opacities, similar to prior. No large pleural fluid collection. No pneumothorax. IMPRESSION: 1. Interval placement of right IJ approach central venous catheter with distal tip terminating at the level the distal SVC. No pneumothorax. 2. Otherwise stable radiographic appearance of the chest. Electronically Signed   By: Nicholas  Plundo D.O.   On: 03/01/2022 13:35     Medications:    sodium chloride 250 mL (02/28/22 2214)   ampicillin-sulbactam (UNASYN) IV     anticoagulant sodium citrate     anticoagulant sodium citrate     feeding supplement (VITAL 1.5 CAL) 70 mL/hr at 03/02/22 1800   insulin  5.5 Units/hr (03/03/22 0347)   linezolid (ZYVOX) IV 600 mg (03/03/22 1002)    Chlorhexidine Gluconate Cloth  6 each Topical Q0600   famotidine  20 mg Per Tube QHS   feeding supplement (PROSource TF)  90 mL Per Tube TID   free water  30 mL Per Tube Q4H   heparin injection (subcutaneous)  5,000 Units Subcutaneous Q8H   insulin aspart  0-20 Units Subcutaneous Q4H   insulin aspart  6 Units Subcutaneous Q4H   insulin detemir  25 Units Subcutaneous BID   insulin starter kit- pen needles  1 kit Other Once   ipratropium-albuterol  3 mL Nebulization Q6H   living well with diabetes book   Does not apply Once   mupirocin ointment   Nasal BID   mouth rinse  15 mL Mouth Rinse Q2H   sodium chloride flush  10-40 mL Intracatheter Q12H    Assessment/ Plan:     Principal Problem:   Acute respiratory failure with hypoxia and hypercarbia (HCC) Active Problems:   Acute on chronic systolic CHF (congestive heart failure) (HCC)   Hypertensive urgency   Type 2 diabetes mellitus with peripheral neuropathy (HCC)   Acute heart failure with preserved ejection fraction (HCC)   Renal failure (ARF), acute on chronic (HCC)   Hospital-acquired pneumonia   ARDS (adult respiratory distress syndrome) (HCC)  46 y.o. male with medical problems of poorly controlled diabetes, hypertension, systolic CHF, obstructive sleep apnea, obesity    admitted on 02/11/2022 for Acute respiratory failure (HCC) [J96.00] Respiratory failure (HCC) [J96.90] Peripheral edema [R60.9] Acute respiratory failure with hypoxia and hypercapnia (HCC) [J96.01, J96.02]   #1: Acute kidney injury: Patient with worsening renal insufficiency.  Acute kidney injury is most likely secondary to acute tubular necrosis possibly due to hypotension, fever and sepsis.  Patient became oligoanuric with metabolic acidosis and hyperkalemia.    Dialysis initiated on Saturday and received treatment on   Sunday, as well. Planned to rest patient today, concerns arose  about fluid overload. Will dialyze patient later today wit UF goal 2L as tolerated. Will need for additional treatments daily.   #2: Hyperkalemia: Potassium increased to 7 over the weekend without response to Veltassa or insulin. Dialysis initiated to manage potassium. Currently 5.3 today. Will correct with scheduled dialysis.    #3: Congestive heart failure/fluid overload: Will manage fluid with dialysis   #4: Vent dependent respiratory failure: Now with tracheostomy.    #5: Sepsis: Continue linezolid.    LOS: 20 Shantelle Breeze [facility] 8/14/202312:36 PM   

## 2022-03-03 NOTE — Consult Note (Signed)
PHARMACY CONSULT NOTE  Pharmacy Consult for Electrolyte Monitoring and Replacement   Recent Labs: Potassium (mmol/L)  Date Value  03/03/2022 5.4 (H)   Magnesium (mg/dL)  Date Value  03/03/2022 2.9 (H)   Calcium (mg/dL)  Date Value  03/03/2022 7.3 (L)   Albumin (g/dL)  Date Value  03/01/2022 1.9 (L)   Phosphorus (mg/dL)  Date Value  03/03/2022 10.8 (H)   Sodium (mmol/L)  Date Value  03/03/2022 135   Assessment: Patient is a 46 y/o M with medical history including DM, HTN, pancreatitis, tobacco use disorder, systolic CHF, OSA, DM c/b diabetic neuropathy, lumbar radiculopathy, morbid obesity who is admitted with acute respiratory failure in setting of acute CHF and hypertensive urgency. Patient is currently intubated, sedated, and on mechanical ventilation in the ICU. Pharmacy consulted to assist with electrolyte monitoring and replacement as indicated.  Nutrition: Tube feeds at 70 mL/hr (~ 1.68 L/day) + free water 30 mL q4h (180 mL/day)   Nephrology consulted for renal dysfunction and to assist with hypernatremia management. HD started 8/12  Goal of Therapy:  Electrolytes within normal limits  Plan:  --Hypernatremia resolved, fluid management per PCCM and nephrology --Mild hyperkalemia, management per PCCM and nephrology (K5.3>5.4 - correction with scheduled HD next session 8/14 evening) --Magnesium & phosphorous elevated in setting of renal dysfunction --BMP ordered BID  Lorna Dibble  03/03/2022 5:43 PM

## 2022-03-03 NOTE — Progress Notes (Signed)
Hd tx of 3.5hrs completed. 84L total vol processed. No complications. Report given to sarah moore, rn  Total uf removed: 2071ml  Post hd v/s: 98.9 109/69(81) 102 31 92%

## 2022-03-04 ENCOUNTER — Inpatient Hospital Stay: Payer: Medicaid Other

## 2022-03-04 DIAGNOSIS — J9602 Acute respiratory failure with hypercapnia: Secondary | ICD-10-CM | POA: Diagnosis not present

## 2022-03-04 DIAGNOSIS — J15212 Pneumonia due to Methicillin resistant Staphylococcus aureus: Secondary | ICD-10-CM | POA: Diagnosis not present

## 2022-03-04 DIAGNOSIS — J9601 Acute respiratory failure with hypoxia: Secondary | ICD-10-CM | POA: Diagnosis not present

## 2022-03-04 LAB — CBC WITH DIFFERENTIAL/PLATELET
Abs Immature Granulocytes: 0.06 10*3/uL (ref 0.00–0.07)
Basophils Absolute: 0 10*3/uL (ref 0.0–0.1)
Basophils Relative: 0 %
Eosinophils Absolute: 0.3 10*3/uL (ref 0.0–0.5)
Eosinophils Relative: 2 %
HCT: 27 % — ABNORMAL LOW (ref 39.0–52.0)
Hemoglobin: 7.9 g/dL — ABNORMAL LOW (ref 13.0–17.0)
Immature Granulocytes: 1 %
Lymphocytes Relative: 7 %
Lymphs Abs: 0.8 10*3/uL (ref 0.7–4.0)
MCH: 26.5 pg (ref 26.0–34.0)
MCHC: 29.3 g/dL — ABNORMAL LOW (ref 30.0–36.0)
MCV: 90.6 fL (ref 80.0–100.0)
Monocytes Absolute: 0.8 10*3/uL (ref 0.1–1.0)
Monocytes Relative: 7 %
Neutro Abs: 8.8 10*3/uL — ABNORMAL HIGH (ref 1.7–7.7)
Neutrophils Relative %: 83 %
Platelets: 195 10*3/uL (ref 150–400)
RBC: 2.98 MIL/uL — ABNORMAL LOW (ref 4.22–5.81)
RDW: 15.4 % (ref 11.5–15.5)
WBC: 10.8 10*3/uL — ABNORMAL HIGH (ref 4.0–10.5)
nRBC: 0.2 % (ref 0.0–0.2)

## 2022-03-04 LAB — BASIC METABOLIC PANEL
Anion gap: 12 (ref 5–15)
BUN: 91 mg/dL — ABNORMAL HIGH (ref 6–20)
CO2: 25 mmol/L (ref 22–32)
Calcium: 7.3 mg/dL — ABNORMAL LOW (ref 8.9–10.3)
Chloride: 96 mmol/L — ABNORMAL LOW (ref 98–111)
Creatinine, Ser: 4.23 mg/dL — ABNORMAL HIGH (ref 0.61–1.24)
GFR, Estimated: 17 mL/min — ABNORMAL LOW (ref >60)
Glucose, Bld: 167 mg/dL — ABNORMAL HIGH (ref 70–99)
Potassium: 4.1 mmol/L (ref 3.5–5.1)
Sodium: 133 mmol/L — ABNORMAL LOW (ref 135–145)

## 2022-03-04 LAB — GLUCOSE, CAPILLARY
Glucose-Capillary: 162 mg/dL — ABNORMAL HIGH (ref 70–99)
Glucose-Capillary: 140 mg/dL — ABNORMAL HIGH (ref 70–99)
Glucose-Capillary: 143 mg/dL — ABNORMAL HIGH (ref 70–99)
Glucose-Capillary: 96 mg/dL (ref 70–99)
Glucose-Capillary: 122 mg/dL — ABNORMAL HIGH (ref 70–99)
Glucose-Capillary: 109 mg/dL — ABNORMAL HIGH (ref 70–99)

## 2022-03-04 LAB — C-REACTIVE PROTEIN: CRP: 17.6 mg/dL — ABNORMAL HIGH (ref <1.0)

## 2022-03-04 LAB — MAGNESIUM: Magnesium: 2.5 mg/dL — ABNORMAL HIGH (ref 1.7–2.4)

## 2022-03-04 LAB — HEPATITIS B SURFACE ANTIBODY, QUANTITATIVE: Hep B S AB Quant (Post): <3.1 m[IU]/mL — ABNORMAL LOW (ref >9.9)

## 2022-03-04 LAB — PHOSPHORUS: Phosphorus: 9.5 mg/dL — ABNORMAL HIGH (ref 2.5–4.6)

## 2022-03-04 MED ORDER — MIDAZOLAM HCL 2 MG/2ML IJ SOLN
INTRAMUSCULAR | Status: AC
  Filled 2022-03-04: qty 4

## 2022-03-04 MED ORDER — LINEZOLID 600 MG PO TABS
600.0000 mg | ORAL_TABLET | Freq: Two times a day (BID) | ORAL | Status: AC
  Administered 2022-03-04 – 2022-03-09 (×11): 600 mg
  Filled 2022-03-04 (×11): qty 1

## 2022-03-04 NOTE — Progress Notes (Signed)
Date of Admission:  02/11/2022     ID: Samuel Chavez is a 46 y.o. male Principal Problem:   Acute respiratory failure with hypoxia and hypercarbia (HCC) Active Problems:   Acute on chronic systolic CHF (congestive heart failure) (HCC)   Hypertensive urgency   Type 2 diabetes mellitus with peripheral neuropathy (HCC)   Acute heart failure with preserved ejection fraction (HCC)   Renal failure (ARF), acute on chronic (HCC)   Hospital-acquired pneumonia   ARDS (adult respiratory distress syndrome) (HCC)    Subjective: Remains intubated/vventilated  Medications:   Chlorhexidine Gluconate Cloth  6 each Topical Q0600   famotidine  20 mg Per Tube QHS   feeding supplement (PROSource TF)  90 mL Per Tube TID   free water  30 mL Per Tube Q4H   heparin injection (subcutaneous)  5,000 Units Subcutaneous Q8H   insulin aspart  0-20 Units Subcutaneous Q4H   insulin aspart  6 Units Subcutaneous Q4H   insulin detemir  25 Units Subcutaneous BID   insulin starter kit- pen needles  1 kit Other Once   ipratropium-albuterol  3 mL Nebulization Q6H   linezolid  600 mg Per Tube Q12H   living well with diabetes book   Does not apply Once   multivitamin  1 tablet Per Tube QHS   mupirocin ointment   Nasal BID   mouth rinse  15 mL Mouth Rinse Q2H   sodium chloride flush  10-40 mL Intracatheter Q12H    Objective: Vital signs in last 24 hours: Temp:  [98.9 F (37.2 C)-101.3 F (38.5 C)] 99.5 F (37.5 C) (08/15 1200) Pulse Rate:  [97-103] 101 (08/15 1300) Resp:  [10-38] 32 (08/15 1300) BP: (104-135)/(60-81) 104/63 (08/15 1300) SpO2:  [85 %-100 %] 93 % (08/15 1447) FiO2 (%):  [50 %] 50 % (08/15 1447)  LDA Rt PICC Dialysis cath  PHYSICAL EXAM:  General: intubated Not much of response to stimulation- opens eyes Does not follow commands Lungs: b/l airentry Heart: Tachycardia Abdomen: Soft, non-tender,not distended. Bowel sounds normal. No masses Extremities: severe edema of arms and  legs Skin: No rashes or lesions. Or bruising Lymph: Cervical, supraclavicular normal. Neurologic: cannot be assessed Lab Results Recent Labs    03/03/22 0511 03/03/22 1536 03/04/22 0440  WBC 13.1*  --  10.8*  HGB 8.1*  --  7.9*  HCT 28.6*  --  27.0*  NA 131* 135 133*  K 5.3* 5.4* 4.1  CL 94* 96* 96*  CO2 $Re'22 24 25  'soL$ BUN 98* 105* 91*  CREATININE 4.78* 5.60* 4.23*    C-Reactive Protein Recent Labs    03/03/22 0511 03/04/22 0440  CRP 22.6* 17.6*    Microbiology: Tracheal aspirate - culture-MRSA  Studies/Results: MR CERVICAL SPINE WO CONTRAST  Result Date: 03/04/2022 CLINICAL DATA:  Chronic cervical myelopathy. EXAM: MRI CERVICAL SPINE WITHOUT CONTRAST TECHNIQUE: Multiplanar, multisequence MR imaging of the cervical spine was performed. No intravenous contrast was administered. COMPARISON:  CT cervical spine 12/16/2018 FINDINGS: Alignment: Suboptimal image quality due to large patient size. Normal alignment Vertebrae: Negative for fracture or mass Cord: Suboptimal evaluation of the cord. No cord compression or signal abnormality identified given the decreased resolution. Posterior Fossa, vertebral arteries, paraspinal tissues: Negative Disc levels: C2-3: Negative C3-4: Mild spinal stenosis and mild foraminal stenosis bilaterally due to disc bulging and spurring C4-5: Negative C5-6: Negative C6-7: Negative C7-T1: Negative IMPRESSION: Examination quality is limited due to large patient size. Mild spinal and foraminal stenosis at C3-4.  Otherwise negative  Electronically Signed   By: Franchot Gallo M.D.   On: 03/04/2022 15:04   MR BRAIN WO CONTRAST  Result Date: 03/04/2022 CLINICAL DATA:  Mental status change.  Severe toxic encephalopathy. EXAM: MRI HEAD WITHOUT CONTRAST TECHNIQUE: Multiplanar, multiecho pulse sequences of the brain and surrounding structures were obtained without intravenous contrast. COMPARISON:  CT head 02/21/2022 FINDINGS: Brain: No acute infarction, hemorrhage,  hydrocephalus, extra-axial collection or mass lesion. Normal white matter. Vascular: Normal arterial flow voids Skull and upper cervical spine: No focal skeletal lesion. Sinuses/Orbits: Mucosal edema paranasal sinuses. Bilateral mastoid effusion. Negative orbit Other: None IMPRESSION: Normal MRI of the brain Mucosal edema in the paranasal sinuses and mastoid sinus bilaterally. Electronically Signed   By: Franchot Gallo M.D.   On: 03/04/2022 14:56     Assessment/Plan: Acute  respiratory failure with hypercapnia, and hypoxia- intubated on admisison in the ED and now has tracheostomy Contributing factors, OSA, COPD, CHF, obesity hypoventilation syndrome MRI no acute finding   Fever X 5 - post tracheostomy- no hematoma  Leucocytosis has improved  D.D ? Pneumonia due to MRSA Tongue injury VS   sinusitis PICC in place- site is clean RESp viral PCR Neg On linezolid for MRSA pneumonia- would treat for 10 -14 days  And unasyn for prevotella sinusitis- may do for 7-10 days   Recommend CT chest/ abd if fever persist    Pneumonia - Strep pneumo/hinfluenza in tracheal aspirate has been adequately treated.but now has MRSA- on linezolid If fever persist CT chest to look for empyema   Anemia ? __AKI- started dialysis Hypernatremia- resolved Hyperkalemia   Anasarca   _Chronic systolic heart failure- cardiology following  Discussed the management with care team

## 2022-03-04 NOTE — Plan of Care (Signed)
Discussed in front of patient at shift change plan of care for the evening, pain management and cooling blanket with no evidence of learning,  Patient is on a ventilator with his trach.  Problem: Education: Goal: Ability to demonstrate management of disease process will improve Outcome: Progressing

## 2022-03-04 NOTE — Progress Notes (Signed)
[facility]  PROGRESS NOTE   Subjective:   Patient seen and evaluated at the bedside in ICU Eyes open today, unresponsive to name Remains on vent 50% Tube feeds  Dialysis yesterday, UF 2L removed UOP 65m in past 24 hours  Objective:  Vital signs: Blood pressure 125/75, pulse 98, temperature 99.4 F (37.4 C), temperature source Oral, resp. rate (!) 30, height 5' 9" (1.753 m), weight (!) 197.4 kg, SpO2 92 %.  Intake/Output Summary (Last 24 hours) at 03/04/2022 1013 Last data filed at 03/04/2022 0700 Gross per 24 hour  Intake 2621.98 ml  Output 2002 ml  Net 619.98 ml    Filed Weights   02/27/22 0415 02/28/22 0429 03/02/22 0155  Weight: (!) 198.5 kg (!) 196 kg (!) 197.4 kg     Physical Exam: General:  No acute distress, resting comfortably  Head:  Normocephalic, atraumatic. Moist oral mucosal membranes  Eyes:  Anicteric  Lungs:   intubated  Heart:  S1S2 no rubs  Abdomen:   Soft, nontender, bowel sounds present  Extremities: 2+ peripheral edema.  Neurologic: Response to pain only  Skin:  No lesions  Access: Right IJ temp cath placed on 89/73/53   Basic Metabolic Panel: Recent Labs  Lab 02/28/22 2318 03/01/22 0520 03/01/22 1042 03/01/22 1401 03/01/22 1721 03/02/22 0313 03/02/22 0720 03/02/22 1756 03/02/22 2344 03/03/22 0400 03/03/22 0511 03/03/22 1536 03/04/22 0440  NA  --  143  --  140   < > 139  --  135  --   --  131* 135 133*  K 6.5* 6.3*   < > 7.0*   < > 5.9*   < > 4.6 4.8 5.1 5.3* 5.4* 4.1  CL  --  108  --  105   < > 101  --  96*  --   --  94* 96* 96*  CO2  --  23  --  24   < > 27  --  28  --   --  _0 GLUCOSE  --  183*  --  191*   < > 179*  --  152*  --   --  249* 175* 167*  BUN  --  121*  --  128*   < > 106*  --  82*  --   --  98* 105* 91*  CREATININE  --  4.79*  --  5.18*   < > 4.78*  --  3.95*  --   --  4.78* 5.60* 4.23*  CALCIUM  --  7.8*  --  7.8*   < > 7.6*  --  7.5*  --   --  7.2* 7.3* 7.3*  MG 3.2* 3.3*  --   --   --   3.0*  --   --   --   --  2.9*  --  2.5*  PHOS  --  10.1*  --  11.3*  --  11.7*  --   --   --   --  10.8*  --  9.5*   < > = values in this interval not displayed.     CBC: Recent Labs  Lab 02/28/22 0425 03/01/22 0520 03/01/22 1721 03/02/22 0313 03/03/22 0511 03/04/22 0440  WBC 14.3* 14.4* 16.2* 16.9* 13.1* 10.8*  NEUTROABS 12.7* 12.4*  --  15.0* 11.3* 8.8*  HGB 8.4* 8.6* 8.9* 9.0* 8.1* 7.9*  HCT 30.0* 30.4* 30.7* 31.8* 28.6* 27.0*  MCV 96.5 94.7 92.2 95.5 93.8 90.6  PLT 204  187 208 199 198 195      Urinalysis: No results for input(s): "COLORURINE", "LABSPEC", "PHURINE", "GLUCOSEU", "HGBUR", "BILIRUBINUR", "KETONESUR", "PROTEINUR", "UROBILINOGEN", "NITRITE", "LEUKOCYTESUR" in the last 72 hours.  Invalid input(s): "APPERANCEUR"     Imaging: No results found.   Medications:    sodium chloride 250 mL (03/03/22 2253)   ampicillin-sulbactam (UNASYN) IV 3 g (03/04/22 0920)   anticoagulant sodium citrate     anticoagulant sodium citrate     feeding supplement (VITAL 1.5 CAL) 70 mL/hr at 03/04/22 0700   linezolid (ZYVOX) IV 600 mg (03/04/22 0927)    Chlorhexidine Gluconate Cloth  6 each Topical Q0600   famotidine  20 mg Per Tube QHS   feeding supplement (PROSource TF)  90 mL Per Tube TID   free water  30 mL Per Tube Q4H   heparin injection (subcutaneous)  5,000 Units Subcutaneous Q8H   insulin aspart  0-20 Units Subcutaneous Q4H   insulin aspart  6 Units Subcutaneous Q4H   insulin detemir  25 Units Subcutaneous BID   insulin starter kit- pen needles  1 kit Other Once   ipratropium-albuterol  3 mL Nebulization Q6H   living well with diabetes book   Does not apply Once   multivitamin  1 tablet Per Tube QHS   mupirocin ointment   Nasal BID   mouth rinse  15 mL Mouth Rinse Q2H   sodium chloride flush  10-40 mL Intracatheter Q12H    Assessment/ Plan:     Principal Problem:   Acute respiratory failure with hypoxia and hypercarbia (HCC) Active Problems:   Acute on  chronic systolic CHF (congestive heart failure) (HCC)   Hypertensive urgency   Type 2 diabetes mellitus with peripheral neuropathy (HCC)   Acute heart failure with preserved ejection fraction (HCC)   Renal failure (ARF), acute on chronic (HCC)   Hospital-acquired pneumonia   ARDS (adult respiratory distress syndrome) ([place])  46 y.o. male with medical problems of poorly controlled diabetes, hypertension, systolic CHF, obstructive sleep apnea, obesity    admitted on 02/11/2022 for Acute respiratory failure (HCC) [J96.00] Respiratory failure (HCC) [J96.90] Peripheral edema [R60.9] Acute respiratory failure with hypoxia and hypercapnia (HCC) [J96.01, J96.02]   #1: Acute kidney injury: Patient with worsening renal insufficiency.  Acute kidney injury is most likely secondary to acute tubular necrosis possibly due to hypotension, fever and sepsis.  Patient became oligoanuric with metabolic acidosis and hyperkalemia.    Received dialysis yesterday, tolerated treatment well.  UF 2 L achieved.  No acute need for dialysis today, will continue to determine daily if needed.  Anuric remains.  Will obtain renal ultrasound to evaluate obstruction.  #2: Hyperkalemia: Corrected with dialysis, 4.1 today.     #3: Congestive heart failure/fluid overload: Able to remove 2L fluid yesterday with dialysis. UOP remains decreased. Will continue to monitor.    #4: Vent dependent respiratory failure: Now with tracheostomy.    #5: Sepsis: Continue linezolid.    LOS: [facility] 8/15/202310:13 AM

## 2022-03-04 NOTE — Consult Note (Signed)
PHARMACY CONSULT NOTE  Pharmacy Consult for Electrolyte Monitoring and Replacement   Recent Labs: Potassium (mmol/L)  Date Value  03/04/2022 4.1   Magnesium (mg/dL)  Date Value  03/04/2022 2.5 (H)   Calcium (mg/dL)  Date Value  03/04/2022 7.3 (L)   Albumin (g/dL)  Date Value  03/01/2022 1.9 (L)   Phosphorus (mg/dL)  Date Value  03/04/2022 9.5 (H)   Sodium (mmol/L)  Date Value  03/04/2022 133 (L)   Assessment: Patient is a 46 y/o M with medical history including DM, HTN, pancreatitis, tobacco use disorder, systolic CHF, OSA, DM c/b diabetic neuropathy, lumbar radiculopathy, morbid obesity who is admitted with acute respiratory failure in setting of acute CHF and hypertensive urgency. Patient is currently intubated, sedated, and on mechanical ventilation in the ICU. Pharmacy consulted to assist with electrolyte monitoring and replacement as indicated.  Nutrition: Tube feeds at 70 mL/hr (~ 1.68 L/day) + free water 30 mL q4h (180 mL/day)   Nephrology consulted for renal dysfunction and to assist with hypernatremia management. HD started 8/12  Goal of Therapy:  Electrolytes within normal limits  Plan:  --Hypernatremia resolved, fluid management per PCCM and nephrology --Hyperkalemia resolved, management per PCCM and nephrology --Magnesium & phosphorous elevated in setting of renal dysfunction --BMP ordered BID  Benita Gutter  03/04/2022 7:56 AM

## 2022-03-04 NOTE — Progress Notes (Addendum)
NAME:  Samuel Chavez, MRN:  [id number], DOB:  June 26, 1976, LOS: 10 ADMISSION DATE:  02/11/2022  History of Present Illness:  46 y.o male with significant PMH as below who presented to the ED with chief complaints of SOB and worsening bilateral lower extremities edema.   ED Course: In the emergency department, the temperature was 37.3C, the heart rate 99 beats/minute, the blood pressure 187/102 mm Hg, the respiratory rate 20 breaths/minute, and the oxygen saturation 89% on. He was noted to be drowsy but still protecting his airway and responding appropriately.   Pertinent  Medical History   Past Medical History:  Diagnosis Date   Diabetes mellitus without complication ([place])    Hypertension    Pancreatitis     Significant Hospital Events: Including procedures, antibiotic start and stop dates in addition to other pertinent events   7/25: Admitted to hospitalist service w/acute on chronic hypoxic hypercapnic resp. failure requiring BiPAP.Failed BiPAP and intubated. PCCM consulted 7/26: INTUBATED, difficlut to sedate, started [place] 7/27: severe hypoxia, PEEP increased to 15 but decreased back to 10 7/28: Persistent severe hypoxia, ventilator changes made 7/29: Persistent hypoxia, recruitment maneuvers instituted, ventilator changes made, empiric heparin as patient cannot be scanned query PE -02/17/22- patient is severely critically ill with bilateral multifocal pneumonia on sedation and paralysis with mechanical ventilation maximal settings.  He is at cusp of death, we met with daughter today and discussed severity of illness. Unable to perform CT due to severity of critical illness. Met with previous PCCM doc and discussed medical plan.  Patient had recruitment maneuvers last few days without improvement, on heparin gtt for possible PE.  02/18/22- patient continues to require maximal settings on ventilator despite good UOP.  He destaturated to <50% spO2 on MV today required BGV.    02/19/22- patient continues to require 100% FiO2 on maximal setting with high PEEP ladder for possible ARDS.  He was diuresed and on steroids with development of AKI.  His CXR had slight interval improvement on right. We discussed case with vascular surgery regarding possible empiric tPA for PE.  02/20/22- patient was unable to get CT today due to continued severe hypoxemia.  Daughter and other family members at bedside we reviewed case and medical plan.  There seems to be some confusion from family as they had asked me to wake patient up and take off ventilator even though I had explained multiple times that he is at very high risk for death.  They laughed at my comments and seemed to think it was not true. We may still be able to do bronchoscopy but its high risk.  We have reduced IV infusions by switching some medications to OGT route.  02/21/22- patient is weaned to 60%FiO2.  Plan for possible bronch if patient is tolerating.  Family at bedside.  If able to we will obtain more imaging and VQ scan to rule out PE.  02/22/22- Patient weaned to 50% on PRVC.  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 CBC stable , CMP with improved GFR. Monitoring Na, pharmacy and renal following for electrolytes/renal function.  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  Overall marked improvement on ventilator over past 48h. Met with daughter at bedside reviewed medical plan.  02/24/22-Vent requirements continue to slowly improve, currently 40% FiO2 and 14 PEEP.  AKI continues to slowly improve, remains hyperkalemic with potassium of 5.8.  Placed back on  Veltassa, Diurese x1.  Trach scheduled for tomorrow at 1:15 PM 02/25/22- Diurese with Lasix x1 dose. Trach scheduled for today. 02/26/22: Remains mechanically ventilated via newly placed tracheostomy will attempt to wean ventilator settings today.  Perform WUA  02/26/22: Pt remains mechanically  ventilated via tracheostomy vent settings: PEEP 14/FiO2 80% 02/27/22: Fevers, ID consulted. Repeating Tracheal aspirate and UA.  Zosyn started 02/28/22: Persistent fevers, Tracheal aspirate with Staph aureus, start Linezolid.  Worsening Creatinine, considering Lasix gtt given high vent requirements (100% FiO2, 14 peep).  D/c fentanyl gtt and decrease oral benzos/narcotics 03/01/22: worsening anuric renal failure. HD line placed and started on hemodialysis. 03/02/22: Vent support slowly improving. Plan for HD again today. 8/14-8/15: remains febrile, encephalopathic despite HD sessions  Consults:  PCCM Nephrology Vascular Surgery Palliative Care ENT Infectious Disease  Procedures:  7/25: Intubation 7/26: PICC triple-lumen, right basilic vein 3/71: Left radial arterial line  8/08: Size 8 proximal XLT Shiley inserted 8/12: Right IJ HD catheter placed  Significant Diagnostic Tests:  7/25  Chest Xray:Chest x-ray showed cardiomegaly and pulmonary vascular congestion without overt pulmonary edema 7/25 Echocardiogram: difficult study, LVEF was estimated at 55 to 60%, dilated LV moderate LVH, grade 1 DD, enlargement of the right ventricle, left atrial size moderately dilated, right atrial size moderately dilated this is consistent with restrictive physiology (echo reading not congruous with prior echoes from [facility]) 7/29: Venous US BLE>>IMPRESSION: No lower extremity DVT. 7/30 Limited echo: LVEF 40 to 45% decreased LV function, no wall motion abnormalities, moderate dilation of the LV concentric LVH, right ventricular size moderately enlarged, right atrial enlargement, this is more consistent with prior [facility] scans 8/4: CT Head>>No acute intracranial abnormality. Paranasal sinusitis. Bilateral complete opacification of the mastoid air cells and inner ears, as can be seen in the setting of otitis media. Correlate with symptoms. 8/4: CT Chest/Abdomen/Pelvis>>Extensive  ground-glass, alveolar and patchy dense infiltrates in both lungs suggesting multifocal pneumonia. Part of this finding may suggest underlying pulmonary edema. Small bilateral pleural effusions are seen. Cardiomegaly. There is ectasia of the main pulmonary artery suggesting pulmonary arterial hypertension. There is no evidence of intestinal obstruction or pneumoperitoneum. There is no hydronephrosis. Appendix is not dilated. UB diverticula are seen in the colon without signs of focal diverticulitis. Severe degenerative changes are noted at L4-L5 level with significant interval worsening. Findings may be due to severe disc degeneration. If there is clinical suspicion for discitis or osteomyelitis, follow-up MRI may be considered 8/4: Lung V/Q>>Pulmonary embolism absent.  Micro Data:  7/25: SARS-CoV-2 PCR> negative 7/25: MRSA PCR>> NEG 7/27: Strep pneumoniae Ag>> negative 7/27: Legionella Ag>> negative 7/27: Mycoplasma>> <770 7/27: Sputum>> Strep pneumo,Haemophilus influenzae 7/30 Sputum cult >> negative 8/4: Tracheal aspirate>>negative 8/9: Tracheal aspirate>>MRSA 8/10: RVP>>negative 8/10: Right nasal Wound culture>>STAPHYLOCOCCUS AUREUS, PROVOTELLA 8/10: MRSA PCR>> Positive 8/11: Blood culture 1/4>>Staphylococcus epidermidis MecA/C (suspect contaminant)  Antimicrobials:  Cefepime 7/27 >>7/31 Ceftriaxone 7/31>>8/4 Vancomycin 7/27 >> 7/29, restarted 7/30 (resumed due to fever spike on Maxipime) ? Resistant Strep pneumo >> 7/31 Zosyn 8/10>> Linezolid 8/11>>  Interim History / Subjective:  Remains severely encephalopathic On vent Febrile, low grade Foley is out Minimal urine on bladder scan  Objective   Blood pressure 125/75, pulse 98, temperature 99.4 F (37.4 C), temperature source Oral, resp. rate (!) 30, height $RemoveBe'5\' 9"'ABwmcasel$  (1.753 m), weight (!) 197.4 kg, SpO2 92 %. CVP:  [21 mmHg-25 mmHg] 24 mmHg  Vent Mode: PRVC FiO2 (%):  [50 %] 50 % Set Rate:  [30 bmp] 30 bmp Vt  Set:  [460  mL-560 mL] 560 mL PEEP:  [16 cmH20] 16 cmH20 Plateau Pressure:  [22 cmH20-31 cmH20] 31 cmH20   Intake/Output Summary (Last 24 hours) at 03/04/2022 0841 Last data filed at 03/04/2022 0700 Gross per 24 hour  Intake 2772.65 ml  Output 2002 ml  Net 770.65 ml    Filed Weights   02/27/22 0415 02/28/22 0429 03/02/22 0155  Weight: (!) 198.5 kg (!) 196 kg (!) 197.4 kg    Examination: Obese man in NAD Pupils equal Grimaces to pain but no motor response, unchanged Maybe tries to track to L, no tracking to R +cough/gag Ext with ongoing severe edema Abd soft Lungs diminished bases Ext warm, heart sounds regular  BMP slowly improving with HD WBC improved No new chest imaging   Resolved Hospital Problem list   Community-acquired pneumonia: Streptococcus pneumoniae and Haemophilus influenzae ~ TREATED  Hypernatremia  Assessment & Plan:  Acute on chronic hypoxemic and hypercapneic respiratory failure now s/p tracheostomy  MRSA HCAP- on linezolid  Provetella sinusitis- unasyn, duration TBD  Obesity cardiomyopathy, OHS/OSA  Acute renal failure with hyperkalemia now on iHD  DM2 with hyperglycemia- now on basal bolus  Ongoing severe toxic encephalopathy- still out of proportion to history and not improving with HD.   Ongoing fevers- just started unasyn, hopefully will settle out, appreciate ID input  - MRI brain, C spine.  EEG either later today or tomorrow r/o SCSE. - iHD per nephrology - Continue basal bolus insulin - BMP daily - Linezolid x 7 days - Unasyn duration TBD - Check duplex x 4 for nonresolving fevers, if continues over next few days may need pan CT to make sure we have source control. - Lung protective tidal volumes limiting driving pressures to < 15cm H2O as able - VAP prevention bundle - Post trach care per usual pathway, sutures out today  Best Practice (right click and "Reselect all SmartList Selections" daily)   Diet/type: tubefeeds DVT prophylaxis:  prophylactic heparin  GI prophylaxis: PPI Lines: Central line and Dialysis Catheter Foley:  Out Code Status:  full code Last date of multidisciplinary goals of care discussion [ongoing]  Long term prognosis guarded  45 min cc time Erskine Emery MD PCCM

## 2022-03-05 ENCOUNTER — Inpatient Hospital Stay: Payer: Medicaid Other

## 2022-03-05 DIAGNOSIS — J15212 Pneumonia due to Methicillin resistant Staphylococcus aureus: Secondary | ICD-10-CM | POA: Diagnosis not present

## 2022-03-05 DIAGNOSIS — J9601 Acute respiratory failure with hypoxia: Secondary | ICD-10-CM | POA: Diagnosis not present

## 2022-03-05 DIAGNOSIS — J9602 Acute respiratory failure with hypercapnia: Secondary | ICD-10-CM | POA: Diagnosis not present

## 2022-03-05 LAB — GLUCOSE, CAPILLARY
Glucose-Capillary: 143 mg/dL — ABNORMAL HIGH (ref 70–99)
Glucose-Capillary: 121 mg/dL — ABNORMAL HIGH (ref 70–99)
Glucose-Capillary: 127 mg/dL — ABNORMAL HIGH (ref 70–99)
Glucose-Capillary: 126 mg/dL — ABNORMAL HIGH (ref 70–99)
Glucose-Capillary: 137 mg/dL — ABNORMAL HIGH (ref 70–99)
Glucose-Capillary: 127 mg/dL — ABNORMAL HIGH (ref 70–99)

## 2022-03-05 LAB — CBC WITH DIFFERENTIAL/PLATELET
Abs Immature Granulocytes: 0.07 10*3/uL (ref 0.00–0.07)
Basophils Absolute: 0 10*3/uL (ref 0.0–0.1)
Basophils Relative: 0 %
Eosinophils Absolute: 0.3 10*3/uL (ref 0.0–0.5)
Eosinophils Relative: 3 %
HCT: 27.4 % — ABNORMAL LOW (ref 39.0–52.0)
Hemoglobin: 8.1 g/dL — ABNORMAL LOW (ref 13.0–17.0)
Immature Granulocytes: 1 %
Lymphocytes Relative: 9 %
Lymphs Abs: 0.9 10*3/uL (ref 0.7–4.0)
MCH: 26.2 pg (ref 26.0–34.0)
MCHC: 29.6 g/dL — ABNORMAL LOW (ref 30.0–36.0)
MCV: 88.7 fL (ref 80.0–100.0)
Monocytes Absolute: 0.9 10*3/uL (ref 0.1–1.0)
Monocytes Relative: 8 %
Neutro Abs: 8.6 10*3/uL — ABNORMAL HIGH (ref 1.7–7.7)
Neutrophils Relative %: 79 %
Platelets: 229 10*3/uL (ref 150–400)
RBC: 3.09 MIL/uL — ABNORMAL LOW (ref 4.22–5.81)
RDW: 15.4 % (ref 11.5–15.5)
WBC: 10.8 10*3/uL — ABNORMAL HIGH (ref 4.0–10.5)
nRBC: 0 % (ref 0.0–0.2)

## 2022-03-05 LAB — BASIC METABOLIC PANEL
Anion gap: 14 (ref 5–15)
BUN: 118 mg/dL — ABNORMAL HIGH (ref 6–20)
CO2: 23 mmol/L (ref 22–32)
Calcium: 7.4 mg/dL — ABNORMAL LOW (ref 8.9–10.3)
Chloride: 97 mmol/L — ABNORMAL LOW (ref 98–111)
Creatinine, Ser: 5.73 mg/dL — ABNORMAL HIGH (ref 0.61–1.24)
GFR, Estimated: 12 mL/min — ABNORMAL LOW (ref >60)
Glucose, Bld: 141 mg/dL — ABNORMAL HIGH (ref 70–99)
Potassium: 4.8 mmol/L (ref 3.5–5.1)
Sodium: 134 mmol/L — ABNORMAL LOW (ref 135–145)

## 2022-03-05 LAB — CULTURE, BLOOD (ROUTINE X 2)
Culture: NO GROWTH
Special Requests: ADEQUATE

## 2022-03-05 LAB — HEPATITIS B SURFACE ANTIBODY, QUANTITATIVE: Hep B S AB Quant (Post): <3.1 m[IU]/mL — ABNORMAL LOW (ref >9.9)

## 2022-03-05 LAB — PHOSPHORUS: Phosphorus: >30 mg/dL — ABNORMAL HIGH (ref 2.5–4.6)

## 2022-03-05 LAB — C-REACTIVE PROTEIN: CRP: 13.3 mg/dL — ABNORMAL HIGH (ref <1.0)

## 2022-03-05 MED ORDER — INSULIN ASPART 100 UNIT/ML IJ SOLN
4.0000 [IU] | INTRAMUSCULAR | Status: DC
  Administered 2022-03-05 – 2022-03-23 (×66): 4 [IU] via SUBCUTANEOUS
  Filled 2022-03-05 (×64): qty 1

## 2022-03-05 MED ORDER — INSULIN DETEMIR 100 UNIT/ML ~~LOC~~ SOLN
22.0000 [IU] | Freq: Two times a day (BID) | SUBCUTANEOUS | Status: DC
  Administered 2022-03-05 – 2022-04-07 (×59): 22 [IU] via SUBCUTANEOUS
  Filled 2022-03-05 (×71): qty 0.22

## 2022-03-05 NOTE — Consult Note (Signed)
PHARMACY CONSULT NOTE  Pharmacy Consult for Electrolyte Monitoring and Replacement   Recent Labs: Potassium (mmol/L)  Date Value  03/05/2022 4.8   Magnesium (mg/dL)  Date Value  03/04/2022 2.5 (H)   Calcium (mg/dL)  Date Value  03/05/2022 7.4 (L)   Albumin (g/dL)  Date Value  03/01/2022 1.9 (L)   Phosphorus (mg/dL)  Date Value  03/05/2022 >30.0 (H)   Sodium (mmol/L)  Date Value  03/05/2022 134 (L)   Assessment: Patient is a 46 y/o M with medical history including DM, HTN, pancreatitis, tobacco use disorder, systolic CHF, OSA, DM c/b diabetic neuropathy, lumbar radiculopathy, morbid obesity who is admitted with acute respiratory failure in setting of acute CHF and hypertensive urgency. Patient is currently intubated, sedated, and on mechanical ventilation in the ICU. Pharmacy consulted to assist with electrolyte monitoring and replacement as indicated.  Nutrition: Tube feeds at 70 mL/hr (~ 1.68 L/day) + free water 30 mL q4h (180 mL/day)   Nephrology consulted for renal dysfunction and to assist with hypernatremia management. HD started 8/12  Goal of Therapy:  Electrolytes within normal limits  Plan:  --Hypernatremia resolved, fluid management per PCCM and nephrology --Hyperkalemia resolved, management per PCCM and nephrology --Magnesium & phosphorous elevated in setting of renal dysfunction --Follow-up electrolytes with AM labs tomorrow  Samuel Chavez  03/05/2022 7:52 AM

## 2022-03-05 NOTE — Progress Notes (Signed)
Date of Admission:  02/11/2022     ID: Samuel Chavez is a 46 y.o. male Principal Problem:   Acute respiratory failure with hypoxia and hypercarbia (HCC) Active Problems:   Acute on chronic systolic CHF (congestive heart failure) (HCC)   Hypertensive urgency   Type 2 diabetes mellitus with peripheral neuropathy (HCC)   Acute heart failure with preserved ejection fraction (HCC)   Renal failure (ARF), acute on chronic (HCC)   Hospital-acquired pneumonia   ARDS (adult respiratory distress syndrome) (HCC)    Subjective: Remains intubated/ventilated Getting dialysis  Medications:   Chlorhexidine Gluconate Cloth  6 each Topical Q0600   famotidine  20 mg Per Tube QHS   feeding supplement (PROSource TF)  90 mL Per Tube TID   free water  30 mL Per Tube Q4H   heparin injection (subcutaneous)  5,000 Units Subcutaneous Q8H   insulin aspart  0-20 Units Subcutaneous Q4H   insulin aspart  4 Units Subcutaneous Q4H   insulin detemir  22 Units Subcutaneous BID   insulin starter kit- pen needles  1 kit Other Once   ipratropium-albuterol  3 mL Nebulization Q6H   linezolid  600 mg Per Tube Q12H   living well with diabetes book   Does not apply Once   multivitamin  1 tablet Per Tube QHS   mupirocin ointment   Nasal BID   mouth rinse  15 mL Mouth Rinse Q2H   sodium chloride flush  10-40 mL Intracatheter Q12H    Objective: Vital signs in last 24 hours: Temp:  [99 F (37.2 C)-99.9 F (37.7 C)] 99 F (37.2 C) (08/16 0800) Pulse Rate:  [94-103] 97 (08/16 0900) Resp:  [27-37] 30 (08/16 0900) BP: (104-133)/(61-73) 122/70 (08/16 0900) SpO2:  [91 %-99 %] 95 % (08/16 0900) FiO2 (%):  [50 %] 50 % (08/15 2314) Weight:  [205 kg] 205 kg (08/16 0409)  LDA Rt PICC Dialysis cath  PHYSICAL EXAM:  General: intubated Not much of response to stimulation- opens eyes Lungs: b/l airentry Heart: Tachycardia Abdomen: Soft, non-tender,not distended. Bowel sounds normal. No masses Extremities: severe  edema of arms and legs Skin: No rashes or lesions. Or bruising Lymph: Cervical, supraclavicular normal. Neurologic: cannot be assessed Lab Results Recent Labs    03/04/22 0440 03/05/22 0419  WBC 10.8* 10.8*  HGB 7.9* 8.1*  HCT 27.0* 27.4*  NA 133* 134*  K 4.1 4.8  CL 96* 97*  CO2 25 23  BUN 91* 118*  CREATININE 4.23* 5.73*    C-Reactive Protein Recent Labs    03/03/22 0511 03/04/22 0440  CRP 22.6* 17.6*    Microbiology: Tracheal aspirate - culture-MRSA  Studies/Results: DG Chest Port 1 View  Result Date: 03/05/2022 CLINICAL DATA:  Acute respiratory failure with hypoxia and hypercarbia EXAM: PORTABLE CHEST 1 VIEW COMPARISON:  March 01, 2022 FINDINGS: Stable support apparatus. Unchanged cardiomegaly and mediastinal contours. Similar mild bilateral reticular pulmonary opacities. No large pneumothorax or pleural effusion. The visualized upper abdomen is unremarkable. No acute osseous abnormality. IMPRESSION: Unchanged mild pulmonary edema. Electronically Signed   By: Beryle Flock M.D.   On: 03/05/2022 08:12   US Venous Img Lower Bilateral (DVT)  Result Date: 03/04/2022 CLINICAL DATA:  46 year old with bilateral lower extremity swelling. EXAM: BILATERAL LOWER EXTREMITY VENOUS DOPPLER ULTRASOUND TECHNIQUE: Gray-scale sonography with graded compression, as well as color Doppler and duplex ultrasound were performed to evaluate the lower extremity deep venous systems from the level of the common femoral vein and including the common  femoral, femoral, profunda femoral, popliteal and calf veins including the posterior tibial, peroneal and gastrocnemius veins when visible. The superficial great saphenous vein was also interrogated. Spectral Doppler was utilized to evaluate flow at rest and with distal augmentation maneuvers in the common femoral, femoral and popliteal veins. COMPARISON:  02/15/2022 FINDINGS: RIGHT LOWER EXTREMITY Common Femoral Vein: No evidence of thrombus. Normal  compressibility, respiratory phasicity and response to augmentation. Saphenofemoral Junction: No evidence of thrombus. Normal compressibility and flow on color Doppler imaging. Profunda Femoral Vein: No evidence of thrombus. Normal compressibility and flow on color Doppler imaging. Femoral Vein: No evidence of thrombus. Normal compressibility, respiratory phasicity and response to augmentation. Popliteal Vein: No evidence of thrombus. Normal compressibility, respiratory phasicity and response to augmentation. Calf Veins: No evidence of thrombus. Normal compressibility and flow on color Doppler imaging. LEFT LOWER EXTREMITY Common Femoral Vein: No evidence of thrombus. Normal compressibility, respiratory phasicity and response to augmentation. Saphenofemoral Junction: No evidence of thrombus. Normal compressibility and flow on color Doppler imaging. Profunda Femoral Vein: No evidence of thrombus. Normal compressibility and flow on color Doppler imaging. Femoral Vein: No evidence of thrombus. Normal compressibility, respiratory phasicity and response to augmentation. Popliteal Vein: No evidence of thrombus. Normal compressibility, respiratory phasicity and response to augmentation. Calf Veins: No evidence of thrombus. Normal compressibility and flow on color Doppler imaging. IMPRESSION: No evidence of deep venous thrombosis in either lower extremity. Electronically Signed   By: Markus Daft M.D.   On: 03/04/2022 20:16   US RENAL  Result Date: 03/04/2022 CLINICAL DATA:  Acute renal failure EXAM: RENAL / URINARY TRACT ULTRASOUND COMPLETE COMPARISON:  None Available. FINDINGS: Right Kidney: Renal measurements: 13.7 x 6.9 x 6.2 cm = volume: 310 mL. Echogenicity within normal limits. No mass or hydronephrosis visualized. Left Kidney: Renal measurements: 12.1 x 5.9 x 5.0 cm = volume: 180 mL. Echogenicity within normal limits. No mass or hydronephrosis visualized. Bladder: Poorly visualized due to body habitus and  decompression. Other: None. IMPRESSION: 1. No ultrasound abnormality of the kidneys. No evidence of hydronephrosis. 2. Bladder poorly visualized due to body habitus and decompression. Electronically Signed   By: Delanna Ahmadi M.D.   On: 03/04/2022 16:04   MR CERVICAL SPINE WO CONTRAST  Result Date: 03/04/2022 CLINICAL DATA:  Chronic cervical myelopathy. EXAM: MRI CERVICAL SPINE WITHOUT CONTRAST TECHNIQUE: Multiplanar, multisequence MR imaging of the cervical spine was performed. No intravenous contrast was administered. COMPARISON:  CT cervical spine 12/16/2018 FINDINGS: Alignment: Suboptimal image quality due to large patient size. Normal alignment Vertebrae: Negative for fracture or mass Cord: Suboptimal evaluation of the cord. No cord compression or signal abnormality identified given the decreased resolution. Posterior Fossa, vertebral arteries, paraspinal tissues: Negative Disc levels: C2-3: Negative C3-4: Mild spinal stenosis and mild foraminal stenosis bilaterally due to disc bulging and spurring C4-5: Negative C5-6: Negative C6-7: Negative C7-T1: Negative IMPRESSION: Examination quality is limited due to large patient size. Mild spinal and foraminal stenosis at C3-4.  Otherwise negative Electronically Signed   By: Franchot Gallo M.D.   On: 03/04/2022 15:04   MR BRAIN WO CONTRAST  Result Date: 03/04/2022 CLINICAL DATA:  Mental status change.  Severe toxic encephalopathy. EXAM: MRI HEAD WITHOUT CONTRAST TECHNIQUE: Multiplanar, multiecho pulse sequences of the brain and surrounding structures were obtained without intravenous contrast. COMPARISON:  CT head 02/21/2022 FINDINGS: Brain: No acute infarction, hemorrhage, hydrocephalus, extra-axial collection or mass lesion. Normal white matter. Vascular: Normal arterial flow voids Skull and upper cervical spine: No focal skeletal lesion. Sinuses/Orbits:  Mucosal edema paranasal sinuses. Bilateral mastoid effusion. Negative orbit Other: None IMPRESSION: Normal  MRI of the brain Mucosal edema in the paranasal sinuses and mastoid sinus bilaterally. Electronically Signed   By: Franchot Gallo M.D.   On: 03/04/2022 14:56     Assessment/Plan: Acute  respiratory failure with hypercapnia, and hypoxia- intubated on admisison in the ED and now has tracheostomy Contributing factors, OSA, COPD, CHF, obesity hypoventilation syndrome MRI no acute finding   Fever X 5 - post tracheostomy-  Leucocytosis has improved  D.D Pneumonia due to MRSA Tongue injury VS   sinusitis PICC in place- site is clean RESp viral PCR Neg On linezolid for MRSA pneumonia- would treat for 10 -14 days  And unasyn for prevotella sinusitis- may do for 7-10 days   Recommend CT chest/ abd if fever persist    Pneumonia - Strep pneumo/hinfluenza in tracheal aspirate has been adequately treated.but now has MRSA- on linezolid If fever persist CT chest to look for empyema   Anemia ? __AKI- started dialysis Hypernatremia- resolved Hyperkalemia   Anasarca   _Chronic systolic heart failure- cardiology following  Discussed the management with care team

## 2022-03-05 NOTE — Procedures (Signed)
Routine EEG Report  Samuel Chavez is a 46 y.o. male with a history of encephalopathy who is undergoing an EEG to evaluate for seizures.  Report: This EEG was acquired with electrodes placed according to the International 10-20 electrode system (including Fp1, Fp2, F3, F4, C3, C4, P3, P4, O1, O2, T3, T4, T5, T6, A1, A2, Fz, Cz, Pz). The following electrodes were missing or displaced: none.  The occipital dominant rhythm was 5 Hz. This activity is reactive to stimulation. Drowsiness was manifested by background fragmentation; deeper stages of sleep were identified by K complexes and sleep spindles. There was no focal slowing. There were no interictal epileptiform discharges. There were no electrographic seizures identified. Photic stimulation and hyperventilation were not performed.  Impression and clinical correlation: This EEG was obtained while awake and asleep and is abnormal due to mild diffuse slowing indicative of global cerebral dysfunction. Epileptiform abnormalities were not seen during this recording.  Su Monks, MD [facility] [phone]  If 7pm- 7am, please page neurology on call as listed in [place].

## 2022-03-05 NOTE — Progress Notes (Signed)
Trach sutures removed. ENT note stated they could be removed one week from placement. No complications noted, no bleeding occurred, and drawtex pad placed under trach flange to help keep excess secretion keeping wound moist. Stas post was 96% and Tidal volumes remained unchanged. Dr. Tamala Julian notified of suture removal.

## 2022-03-05 NOTE — Progress Notes (Signed)
PT did HD for 3.5 hrs UF = 2000 ml Cvc hep locked and capped No distress VS 127/76 Hr 100 Rr 33 O2sat 94 Report given to bedside R.N.

## 2022-03-05 NOTE — Progress Notes (Signed)
NAME:  Samuel Chavez, MRN:  [id number], DOB:  [birth year], LOS: 39 ADMISSION DATE:  02/11/2022  History of Present Illness:  46 y.o male with significant PMH as below who presented to the ED with chief complaints of SOB and worsening bilateral lower extremities edema.   ED Course: In the emergency department, the temperature was 37.3C, the heart rate 99 beats/minute, the blood pressure 187/102 mm Hg, the respiratory rate 20 breaths/minute, and the oxygen saturation 89% on. He was noted to be drowsy but still protecting his airway and responding appropriately.   Pertinent  Medical History   Past Medical History:  Diagnosis Date   Diabetes mellitus without complication ([place])    Hypertension    Pancreatitis     Significant Hospital Events: Including procedures, antibiotic start and stop dates in addition to other pertinent events   7/25: Admitted to hospitalist service w/acute on chronic hypoxic hypercapnic resp. failure requiring BiPAP.Failed BiPAP and intubated. PCCM consulted 7/26: INTUBATED, difficlut to sedate, started [place] 7/27: severe hypoxia, PEEP increased to 15 but decreased back to 10 7/28: Persistent severe hypoxia, ventilator changes made 7/29: Persistent hypoxia, recruitment maneuvers instituted, ventilator changes made, empiric heparin as patient cannot be scanned query PE -02/17/22- patient is severely critically ill with bilateral multifocal pneumonia on sedation and paralysis with mechanical ventilation maximal settings.  He is at cusp of death, we met with daughter today and discussed severity of illness. Unable to perform CT due to severity of critical illness. Met with previous PCCM doc and discussed medical plan.  Patient had recruitment maneuvers last few days without improvement, on heparin gtt for possible PE.  02/18/22- patient continues to require maximal settings on ventilator despite good UOP.  He destaturated to <50% spO2 on MV today required BGV.    02/19/22- patient continues to require 100% FiO2 on maximal setting with high PEEP ladder for possible ARDS.  He was diuresed and on steroids with development of AKI.  His CXR had slight interval improvement on right. We discussed case with vascular surgery regarding possible empiric tPA for PE.  02/20/22- patient was unable to get CT today due to continued severe hypoxemia.  Daughter and other family members at bedside we reviewed case and medical plan.  There seems to be some confusion from family as they had asked me to wake patient up and take off ventilator even though I had explained multiple times that he is at very high risk for death.  They laughed at my comments and seemed to think it was not true. We may still be able to do bronchoscopy but its high risk.  We have reduced IV infusions by switching some medications to OGT route.  02/21/22- patient is weaned to 60%FiO2.  Plan for possible bronch if patient is tolerating.  Family at bedside.  If able to we will obtain more imaging and VQ scan to rule out PE.  02/22/22- Patient weaned to 50% on PRVC.  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 CBC stable , CMP with improved GFR. Monitoring Na, pharmacy and renal following for electrolytes/renal function.  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  Overall marked improvement on ventilator over past 48h. Met with daughter at bedside reviewed medical plan.  02/24/22-Vent requirements continue to slowly improve, currently 40% FiO2 and 14 PEEP.  AKI continues to slowly improve, remains hyperkalemic with potassium of 5.8.  Placed back on  Veltassa, Diurese x1.  Trach scheduled for tomorrow at 1:15 PM 02/25/22- Diurese with Lasix x1 dose. Trach scheduled for today. 02/26/22: Remains mechanically ventilated via newly placed tracheostomy will attempt to wean ventilator settings today.  Perform WUA  02/26/22: Pt remains mechanically  ventilated via tracheostomy vent settings: PEEP 14/FiO2 80% 02/27/22: Fevers, ID consulted. Repeating Tracheal aspirate and UA.  Zosyn started 02/28/22: Persistent fevers, Tracheal aspirate with Staph aureus, start Linezolid.  Worsening Creatinine, considering Lasix gtt given high vent requirements (100% FiO2, 14 peep).  D/c fentanyl gtt and decrease oral benzos/narcotics 03/01/22: worsening anuric renal failure. HD line placed and started on hemodialysis. 03/02/22: Vent support slowly improving. Plan for HD again today. 8/14-8/15: remains febrile, encephalopathic despite HD sessions  Consults:  PCCM Nephrology Vascular Surgery Palliative Care ENT Infectious Disease  Procedures:  7/25: Intubation 7/26: PICC triple-lumen, right basilic vein 3/54: Left radial arterial line  8/08: Size 8 proximal XLT [place] inserted 8/12: Right IJ HD catheter placed  Significant Diagnostic Tests:  7/25  Chest Xray:Chest x-ray showed cardiomegaly and pulmonary vascular congestion without overt pulmonary edema 7/25 Echocardiogram: difficult study, LVEF was estimated at 55 to 60%, dilated LV moderate LVH, grade 1 DD, enlargement of the right ventricle, left atrial size moderately dilated, right atrial size moderately dilated this is consistent with restrictive physiology (echo reading not congruous with prior echoes from [facility]) 7/29: Venous US BLE>>IMPRESSION: No lower extremity DVT. 7/30 Limited echo: LVEF 40 to 45% decreased LV function, no wall motion abnormalities, moderate dilation of the LV concentric LVH, right ventricular size moderately enlarged, right atrial enlargement, this is more consistent with prior [facility] scans 8/4: CT Head>>No acute intracranial abnormality. Paranasal sinusitis. Bilateral complete opacification of the mastoid air cells and inner ears, as can be seen in the setting of otitis media. Correlate with symptoms. 8/4: CT Chest/Abdomen/Pelvis>>Extensive  ground-glass, alveolar and patchy dense infiltrates in both lungs suggesting multifocal pneumonia. Part of this finding may suggest underlying pulmonary edema. Small bilateral pleural effusions are seen. Cardiomegaly. There is ectasia of the main pulmonary artery suggesting pulmonary arterial hypertension. There is no evidence of intestinal obstruction or pneumoperitoneum. There is no hydronephrosis. Appendix is not dilated. UB diverticula are seen in the colon without signs of focal diverticulitis. Severe degenerative changes are noted at L4-L5 level with significant interval worsening. Findings may be due to severe disc degeneration. If there is clinical suspicion for discitis or osteomyelitis, follow-up MRI may be considered 8/4: Lung V/Q>>Pulmonary embolism absent.  Micro Data:  7/25: SARS-CoV-2 PCR> negative 7/25: MRSA PCR>> NEG 7/27: Strep pneumoniae Ag>> negative 7/27: Legionella Ag>> negative 7/27: Mycoplasma>> <770 7/27: Sputum>> Strep pneumo,Haemophilus influenzae 7/30 Sputum cult >> negative 8/4: Tracheal aspirate>>negative 8/9: Tracheal aspirate>>MRSA 8/10: RVP>>negative 8/10: Right nasal Wound culture>>STAPHYLOCOCCUS AUREUS, PROVOTELLA 8/10: MRSA PCR>> Positive 8/11: Blood culture 1/4>>Staphylococcus epidermidis MecA/C (suspect contaminant)  Antimicrobials:  Cefepime 7/27 >>7/31 Ceftriaxone 7/31>>8/4 Vancomycin 7/27 >> 7/29, restarted 7/30 (resumed due to fever spike on Maxipime) ? Resistant Strep pneumo >> 7/31 Unasyn 8/14>> Linezolid 8/11>>  Interim History / Subjective:  No events. Remains poorly responsive on high vent settings.  Objective   Blood pressure 119/73, pulse 96, temperature 99.9 F (37.7 C), temperature source Oral, resp. rate (!) 31, height $RemoveBe'5\' 9"'iluqCirPr$  (1.753 m), weight (!) 205 kg, SpO2 94 %. CVP:  [6 mmHg-22 mmHg] 10 mmHg  Vent Mode: PRVC FiO2 (%):  [50 %] 50 % Set Rate:  [30 bmp] 30 bmp Vt Set:  [560 mL] 560 mL PEEP:  [  [place]  Pressure:  [26 cmH20-30 cmH20] 26 cmH20   Intake/Output Summary (Last 24 hours) at 03/05/2022 0804 Last data filed at 03/05/2022 0400 Gross per 24 hour  Intake 0 ml  Output --  Net 0 ml    Filed Weights   02/28/22 0429 03/02/22 0155 03/05/22 0409  Weight: (!) 196 kg (!) 197.4 kg (!) 205 kg    Examination: No distress Maybe tracks on L, cannot get him to cross eyes midline to R No motor response to painful stimuli Ongoing severe anasarca Heart/breath sounds distant due to body habitus  BUN up again CBC stable CXR personally reviewed: patchy L airspace disease + edema  Resolved Hospital Problem list   Community-acquired pneumonia: Streptococcus pneumoniae and Haemophilus influenzae ~ TREATED  Hypernatremia  Assessment & Plan:  Acute on chronic hypoxemic and hypercapneic respiratory failure now s/p tracheostomy  MRSA HCAP- on linezolid x 7 days  Provetella sinusitis- unasyn, duration TBD  Obesity cardiomyopathy, OHS/OSA  Acute renal failure with hyperkalemia now on iHD, still markedly volume up  DM2 with hyperglycemia- now on basal bolus  Ongoing severe toxic encephalopathy- MRI brain/C spine okay 8/15.  Still uremic.  Ongoing fevers- just started unasyn, improved fever curve.  LE duplex neg.  - EEG r/o SCSE. - iHD per nephrology.  Please push fluid removal.  I think his dry weight is probably 170kg (weight from June 2022) - Back off basal bolus insulin a bit - Linezolid x 7 days - Unasyn duration TBD - Lung protective tidal volumes limiting driving pressures to < 15cm H2O as able; weaning some PEEP today, he will not be weanable until extra fluid is removed - VAP prevention bundle - Post trach care per usual pathway - Family updated at bedside  Best Practice (right click and "Reselect all SmartList Selections" daily)   Diet/type: tubefeeds DVT prophylaxis: prophylactic heparin  GI prophylaxis: PPI Lines: Central line and Dialysis Catheter Foley:  Out Code  Status:  full code Last date of multidisciplinary goals of care discussion [ongoing]  Long term prognosis guarded  35 min cc time Erskine Emery MD PCCM

## 2022-03-05 NOTE — Progress Notes (Addendum)
Patient ID: NAZIR HACKER, male   DOB: 06/11/1976, 46 y.o.   MRN: [id number] [place], [place] [id number] [birth year] [name], MD   SUBJECTIVE: This 46 y.o. year old male is status post tracheostomy. Trach stable, sutures out. Still on vent  Medications:  Current Facility-Administered Medications  Medication Dose Route Frequency Provider Last Rate Last Admin   0.9 %  sodium chloride infusion  250 mL Intravenous Continuous Lang Snow, NP 10 mL/hr at 03/03/22 2253 250 mL at 03/03/22 2253   acetaminophen (TYLENOL) tablet 650 mg  650 mg Oral Q6H PRN Mansy, Jan A, MD   650 mg at 03/03/22 2231   Or   acetaminophen (TYLENOL) suppository 650 mg  650 mg Rectal Q6H PRN Mansy, Jan A, MD       alteplase (CATHFLO ACTIVASE) injection 2 mg  2 mg Intracatheter Once PRN Lyla Son, MD       alteplase (CATHFLO ACTIVASE) injection 2 mg  2 mg Intracatheter Once PRN Lyla Son, MD       Ampicillin-Sulbactam (UNASYN) 3 g in sodium chloride 0.9 % 100 mL IVPB  3 g Intravenous BID Lockie Mola B, RPH 200 mL/hr at 03/05/22 0832 3 g at 03/05/22 0938   anticoagulant sodium citrate solution 5 mL  5 mL Intracatheter PRN Lyla Son, MD       anticoagulant sodium citrate solution 5 mL  5 mL Intracatheter PRN Lyla Son, MD       Chlorhexidine Gluconate Cloth 2 % PADS 6 each  6 each Topical [id number] Flora Lipps, MD   6 each at 03/05/22 0555   docusate sodium (COLACE) capsule 100 mg  100 mg Oral BID PRN Flora Lipps, MD       famotidine (PEPCID) tablet 20 mg  20 mg Per Tube QHS Lockie Mola B, RPH   20 mg at 03/04/22 2152   feeding supplement (PROSource TF) liquid 90 mL  90 mL Per Tube TID Flora Lipps, MD   90 mL at 03/05/22 0828   feeding supplement (VITAL 1.5 CAL) liquid 1,000 mL  1,000 mL Per Tube Continuous Flora Lipps, MD 70 mL/hr at 03/05/22 0400 Infusion Verify at 03/05/22 0400   free water 30 mL  30 mL Per Tube Q4H Lockie Mola B, RPH   30 mL at 03/05/22 0828   heparin  injection 1,000 Units  1,000 Units Intracatheter PRN Lyla Son, MD       heparin injection 1,000 Units  1,000 Units Intracatheter PRN Lyla Son, MD   2,600 Units at 03/02/22 1729   heparin injection 5,000 Units  5,000 Units Subcutaneous Q8H Kasa, Maretta Bees, MD   5,000 Units at 03/05/22 0600   insulin aspart (novoLOG) injection 0-20 Units  0-20 Units Subcutaneous Q4H Candee Furbish, MD   3 Units at 03/05/22 3716   insulin aspart (novoLOG) injection 4 Units  4 Units Subcutaneous Q4H Candee Furbish, MD       insulin detemir (LEVEMIR) injection 22 Units  22 Units Subcutaneous BID Candee Furbish, MD   22 Units at 03/05/22 0830   insulin starter kit- pen needles (English) 1 kit  1 kit Other Once Flora Lipps, MD       ipratropium-albuterol (DUONEB) 0.5-2.5 (3) MG/3ML nebulizer solution 3 mL  3 mL Nebulization Q6H Kasa, Kurian, MD   3 mL at 03/05/22 0732   lidocaine (PF) (XYLOCAINE) 1 % injection 5 mL  5 mL Intradermal PRN Lyla Son, MD       lidocaine (  PF) (XYLOCAINE) 1 % injection 5 mL  5 mL Intradermal PRN [name], Madhu, MD       lidocaine-prilocaine (EMLA) cream 1 Application  1 Application Topical PRN Korrapati, Madhu, MD       lidocaine-prilocaine (EMLA) cream 1 Application  1 Application Topical PRN [name], Madhu, MD       linezolid (ZYVOX) tablet 600 mg  600 mg Per Tube Q12H Benita Gutter, RPH   600 mg at 03/05/22 8366   living well with diabetes book MISC   Does not apply Once Flora Lipps, MD       multivitamin (RENA-VIT) tablet 1 tablet  1 tablet Per Tube QHS Candee Furbish, MD   1 tablet at 03/04/22 2152   mupirocin ointment (BACTROBAN) 2 %   Nasal BID Flora Lipps, MD   Given at 03/05/22 0830   Oral care mouth rinse  15 mL Mouth Rinse Q2H Kasa, Maretta Bees, MD   15 mL at 03/05/22 0831   Oral care mouth rinse  15 mL Mouth Rinse PRN Flora Lipps, MD       pentafluoroprop-tetrafluoroeth (GEBAUERS) aerosol 1 Application  1 Application Topical PRN Lyla Son, MD        pentafluoroprop-tetrafluoroeth (GEBAUERS) aerosol 1 Application  1 Application Topical PRN Lyla Son, MD       polyethylene glycol (MIRALAX / GLYCOLAX) packet 17 g  17 g Per Tube Daily PRN Delena Bali, RPH       sodium chloride flush (NS) 0.9 % injection 10-40 mL  10-40 mL Intracatheter Q12H Kasa, Kurian, MD   10 mL at 03/05/22 0831   sodium chloride flush (NS) 0.9 % injection 10-40 mL  10-40 mL Intracatheter PRN Flora Lipps, MD   40 mL at 02/15/22 0907  .  Medications Prior to Admission  Medication Sig Dispense Refill   losartan (COZAAR) 100 MG tablet Take 100 mg by mouth daily.     OZEMPIC, 0.25 OR 0.5 MG/DOSE, 2 MG/3ML SOPN SMARTSIG:0.25 Milligram(s) SUB-Q Once a Week     torsemide (DEMADEX) 20 MG tablet Take 20 mg by mouth daily.     Aspirin-Salicylamide-Caffeine (BC HEADACHE POWDER PO) Take 1 packet by mouth as needed (for pain).     cephALEXin (KEFLEX) 500 MG capsule Take 1 capsule (500 mg total) by mouth 4 (four) times daily. (Patient not taking: Reported on 02/11/2022) 20 capsule 0   cyclobenzaprine (FLEXERIL) 10 MG tablet Take 0.5-1 tablets (5-10 mg total) by mouth 2 (two) times daily as needed for muscle spasms. (Patient not taking: Reported on 02/11/2022) 20 tablet 0   gabapentin (NEURONTIN) 600 MG tablet Take 600 mg by mouth at bedtime. (Patient not taking: Reported on 02/11/2022)     LABETALOL HCL PO Take by mouth. (Patient not taking: Reported on 02/11/2022)     meloxicam (MOBIC) 15 MG tablet Take 1 tablet (15 mg total) by mouth daily. Take 1 daily with food. (Patient not taking: Reported on 02/11/2022) 10 tablet 0   metFORMIN (GLUCOPHAGE) 500 MG tablet Take 500 mg by mouth 2 (two) times daily with a meal. (Patient not taking: Reported on 02/11/2022)     METFORMIN HCL ER, MOD, PO Take by mouth. (Patient not taking: Reported on 02/11/2022)     METOPROLOL SUCCINATE ER PO Take by mouth. (Patient not taking: Reported on 02/11/2022)     oxyCODONE-acetaminophen (PERCOCET) 10-325  MG tablet Take 1 tablet by mouth See admin instructions. Take 1 tablet by mouth 4-5 times a day as needed for  pain (Patient not taking: Reported on 02/11/2022)     oxyCODONE-acetaminophen (PERCOCET/ROXICET) 5-325 MG tablet Take 1 tablet by mouth every 8 (eight) hours as needed for severe pain. (Patient not taking: Reported on 02/11/2022) 4 tablet 0   predniSONE (DELTASONE) 20 MG tablet Take 2 tablets (40 mg total) by mouth daily. (Patient not taking: Reported on 02/11/2022) 6 tablet 0    OBJECTIVE:  PHYSICAL EXAM  Vitals: Blood pressure 122/70, pulse 97, temperature 99 F (37.2 C), temperature source Oral, resp. rate (!) 30, height $RemoveBe'5\' 9"'[id number]$  (1.753 m), weight (!) 205 kg, SpO2 95 %.. General: Obese male on vent Neck: Supple and symmetric with no palpable masses, tenderness or crepitance. The trach is in good position. Mild superficial skin erosion below trach, now well protected by the trach dressing. Nurse removed sutures yesterday and already notes the erosion looks better as they can better position the dressing to protect the skin with sutures out  MEDICAL DECISION MAKING: Data Review:  Results for orders placed or performed during the hospital encounter of 02/11/22 (from the past 48 hour(s))  Blood gas, arterial     Status: Abnormal   Collection Time: 03/03/22 10:43 AM  Result Value Ref Range   FIO2 50 %   Delivery systems VENTILATOR    Mode PRESSURE REGULATED VOLUME CONTROL    MECHVT 460 mL   RATE 30 resp/min   PEEP 16 cm H20   pH, Arterial 7.26 (L) 7.35 - 7.45   pCO2 arterial 61 (H) 32 - 48 mmHg   pO2, Arterial 72 (L) 83 - 108 mmHg   Bicarbonate 27.4 20.0 - 28.0 mmol/L   Acid-base deficit 0.2 0.0 - 2.0 mmol/L   O2 Saturation 95.9 %   Patient temperature 37.0    Collection site LEFT RADIAL    Allens test (pass/fail) PASS PASS    Comment: Performed at [facility], [address] [place], [place] [address]  Glucose, capillary     Status: Abnormal   Collection Time:  03/03/22 12:41 PM  Result Value Ref Range   Glucose-Capillary 164 (H) 70 - 99 mg/dL    Comment: Glucose reference range applies only to samples taken after fasting for at least 8 hours.  Glucose, capillary     Status: Abnormal   Collection Time: 03/03/22  1:28 PM  Result Value Ref Range   Glucose-Capillary 153 (H) 70 - 99 mg/dL    Comment: Glucose reference range applies only to samples taken after fasting for at least 8 hours.  Basic metabolic panel     Status: Abnormal   Collection Time: 03/03/22  3:36 PM  Result Value Ref Range   Sodium 135 135 - 145 mmol/L   Potassium 5.4 (H) 3.5 - 5.1 mmol/L   Chloride 96 (L) 98 - 111 mmol/L   CO2 24 22 - 32 mmol/L   Glucose, Bld 175 (H) 70 - 99 mg/dL    Comment: Glucose reference range applies only to samples taken after fasting for at least 8 hours.   BUN 105 (H) 6 - 20 mg/dL    Comment: RESULT CONFIRMED BY MANUAL DILUTION MU   Creatinine, Ser 5.60 (H) 0.61 - 1.24 mg/dL   Calcium 7.3 (L) 8.9 - 10.3 mg/dL   GFR, Estimated 12 (L) >60 mL/min    Comment: (NOTE) Calculated using the CKD-EPI Creatinine Equation (2021)    Anion gap 15 5 - 15    Comment: Performed at [facility], [address], [place], [place] [address]  Glucose, capillary     Status: Abnormal   Collection Time: 03/03/22  3:50 PM  Result Value Ref Range   Glucose-Capillary 170 (H) 70 - 99 mg/dL    Comment: Glucose reference range applies only to samples taken after fasting for at least 8 hours.  Glucose, capillary     Status: Abnormal   Collection Time: 03/03/22  8:20 PM  Result Value Ref Range   Glucose-Capillary 170 (H) 70 - 99 mg/dL    Comment: Glucose reference range applies only to samples taken after fasting for at least 8 hours.  Glucose, capillary     Status: Abnormal   Collection Time: 03/03/22 11:32 PM  Result Value Ref Range   Glucose-Capillary 158 (H) 70 - 99 mg/dL    Comment: Glucose reference range applies only to samples taken after fasting for at  least 8 hours.  CBC with Differential/Platelet     Status: Abnormal   Collection Time: 03/04/22  4:40 AM  Result Value Ref Range   WBC 10.8 (H) 4.0 - 10.5 K/uL   RBC 2.98 (L) 4.22 - 5.81 MIL/uL   Hemoglobin 7.9 (L) 13.0 - 17.0 g/dL   HCT 27.0 (L) 39.0 - 52.0 %   MCV 90.6 80.0 - 100.0 fL   MCH 26.5 26.0 - 34.0 pg   MCHC 29.3 (L) 30.0 - 36.0 g/dL   RDW 15.4 11.5 - 15.5 %   Platelets 195 150 - 400 K/uL   nRBC 0.2 0.0 - 0.2 %   Neutrophils Relative % 83 %   Neutro Abs 8.8 (H) 1.7 - 7.7 K/uL   Lymphocytes Relative 7 %   Lymphs Abs 0.8 0.7 - 4.0 K/uL   Monocytes Relative 7 %   Monocytes Absolute 0.8 0.1 - 1.0 K/uL   Eosinophils Relative 2 %   Eosinophils Absolute 0.3 0.0 - 0.5 K/uL   Basophils Relative 0 %   Basophils Absolute 0.0 0.0 - 0.1 K/uL   Immature Granulocytes 1 %   Abs Immature Granulocytes 0.06 0.00 - 0.07 K/uL    Comment: Performed at [facility], [address] [place], [place] [address]  C-reactive protein     Status: Abnormal   Collection Time: 03/04/22  4:40 AM  Result Value Ref Range   CRP 17.6 (H) <1.0 mg/dL    Comment: Performed at [facility], [address] [place], [place] [address]  Magnesium     Status: Abnormal   Collection Time: 03/04/22  4:40 AM  Result Value Ref Range   Magnesium 2.5 (H) 1.7 - 2.4 mg/dL    Comment: Performed at [facility], [address] [place], [place] [address]  Phosphorus     Status: Abnormal   Collection Time: 03/04/22  4:40 AM  Result Value Ref Range   Phosphorus 9.5 (H) 2.5 - 4.6 mg/dL    Comment: Performed at [facility], [address] [place], [place] [address]  Basic metabolic panel     Status: Abnormal   Collection Time: 03/04/22  4:40 AM  Result Value Ref Range   Sodium 133 (L) 135 - 145 mmol/L   Potassium 4.1 3.5 - 5.1 mmol/L   Chloride 96 (L) 98 - 111 mmol/L   CO2 25 22 - 32 mmol/L   Glucose, Bld 167 (H) 70 - 99 mg/dL    Comment: Glucose reference range applies only to  samples taken after fasting for at least 8 hours.   BUN 91 (H) 6 - 20 mg/dL  Creatinine, Ser 4.23 (H) 0.61 - 1.24 mg/dL   Calcium 7.3 (L) 8.9 - 10.3 mg/dL   GFR, Estimated 17 (L) >60 mL/min    Comment: (NOTE) Calculated using the CKD-EPI Creatinine Equation (2021)    Anion gap 12 5 - 15    Comment: Performed at [facility], [address] [place], [place] [address]  Glucose, capillary     Status: Abnormal   Collection Time: 03/04/22  4:55 AM  Result Value Ref Range   Glucose-Capillary 162 (H) 70 - 99 mg/dL    Comment: Glucose reference range applies only to samples taken after fasting for at least 8 hours.  Glucose, capillary     Status: Abnormal   Collection Time: 03/04/22  7:49 AM  Result Value Ref Range   Glucose-Capillary 140 (H) 70 - 99 mg/dL    Comment: Glucose reference range applies only to samples taken after fasting for at least 8 hours.  Blood gas, arterial     Status: Abnormal (Preliminary result)   Collection Time: 03/04/22  9:00 AM  Result Value Ref Range   FIO2 0.50 %   Delivery systems 0.50    Mode PRESSURE REGULATED VOLUME CONTROL    MECHVT 560 mL   RATE 30 resp/min   PEEP 16.0 cm H20   pH, Arterial 7.34 (L) 7.35 - 7.45   pCO2 arterial 46 32 - 48 mmHg   pO2, Arterial 84 83 - 108 mmHg   Bicarbonate 24.8 20.0 - 28.0 mmol/L   Acid-base deficit 1.3 0.0 - 2.0 mmol/L   O2 Saturation PENDING %   Patient temperature 37.0    Collection site RIGHT RADIAL    Allens test (pass/fail) PASS PASS    Comment: Performed at [facility], [address] [place], [place] [address]  Glucose, capillary     Status: Abnormal   Collection Time: 03/04/22 11:43 AM  Result Value Ref Range   Glucose-Capillary 143 (H) 70 - 99 mg/dL    Comment: Glucose reference range applies only to samples taken after fasting for at least 8 hours.  Glucose, capillary     Status: None   Collection Time: 03/04/22  4:43 PM  Result Value Ref Range   Glucose-Capillary 96 70 - 99  mg/dL    Comment: Glucose reference range applies only to samples taken after fasting for at least 8 hours.  Glucose, capillary     Status: Abnormal   Collection Time: 03/04/22  7:12 PM  Result Value Ref Range   Glucose-Capillary 122 (H) 70 - 99 mg/dL    Comment: Glucose reference range applies only to samples taken after fasting for at least 8 hours.  Glucose, capillary     Status: Abnormal   Collection Time: 03/04/22 11:12 PM  Result Value Ref Range   Glucose-Capillary 109 (H) 70 - 99 mg/dL    Comment: Glucose reference range applies only to samples taken after fasting for at least 8 hours.  Glucose, capillary     Status: Abnormal   Collection Time: 03/05/22  3:43 AM  Result Value Ref Range   Glucose-Capillary 143 (H) 70 - 99 mg/dL    Comment: Glucose reference range applies only to samples taken after fasting for at least 8 hours.  CBC with Differential/Platelet     Status: Abnormal   Collection Time: 03/05/22  4:19 AM  Result Value Ref Range   WBC 10.8 (H) 4.0 - 10.5 K/uL   RBC 3.09 (L) 4.22 - 5.81 MIL/uL   Hemoglobin  8.1 (L) 13.0 - 17.0 g/dL   HCT 27.4 (L) 39.0 - 52.0 %   MCV 88.7 80.0 - 100.0 fL   MCH 26.2 26.0 - 34.0 pg   MCHC 29.6 (L) 30.0 - 36.0 g/dL   RDW 15.4 11.5 - 15.5 %   Platelets 229 150 - 400 K/uL   nRBC 0.0 0.0 - 0.2 %   Neutrophils Relative % 79 %   Neutro Abs 8.6 (H) 1.7 - 7.7 K/uL   Lymphocytes Relative 9 %   Lymphs Abs 0.9 0.7 - 4.0 K/uL   Monocytes Relative 8 %   Monocytes Absolute 0.9 0.1 - 1.0 K/uL   Eosinophils Relative 3 %   Eosinophils Absolute 0.3 0.0 - 0.5 K/uL   Basophils Relative 0 %   Basophils Absolute 0.0 0.0 - 0.1 K/uL   Immature Granulocytes 1 %   Abs Immature Granulocytes 0.07 0.00 - 0.07 K/uL    Comment: Performed at [facility], [address] [place], [place] [address]  Phosphorus     Status: Abnormal   Collection Time: 03/05/22  4:19 AM  Result Value Ref Range   Phosphorus >30.0 (H) 2.5 - 4.6 mg/dL    Comment: RESULT  CONFIRMED BY MANUAL DILUTION RH Performed at [facility], [address], [place], [place] [address]   Basic metabolic panel     Status: Abnormal   Collection Time: 03/05/22  4:19 AM  Result Value Ref Range   Sodium 134 (L) 135 - 145 mmol/L   Potassium 4.8 3.5 - 5.1 mmol/L   Chloride 97 (L) 98 - 111 mmol/L   CO2 23 22 - 32 mmol/L   Glucose, Bld 141 (H) 70 - 99 mg/dL    Comment: Glucose reference range applies only to samples taken after fasting for at least 8 hours.   BUN 118 (H) 6 - 20 mg/dL    Comment: RESULT CONFIRMED BY MANUAL DILUTION RH   Creatinine, Ser 5.73 (H) 0.61 - 1.24 mg/dL   Calcium 7.4 (L) 8.9 - 10.3 mg/dL   GFR, Estimated 12 (L) >60 mL/min    Comment: (NOTE) Calculated using the CKD-EPI Creatinine Equation (2021)    Anion gap 14 5 - 15    Comment: Performed at [facility], [address] [place], [place] [address]  Glucose, capillary     Status: Abnormal   Collection Time: 03/05/22  7:46 AM  Result Value Ref Range   Glucose-Capillary 121 (H) 70 - 99 mg/dL    Comment: Glucose reference range applies only to samples taken after fasting for at least 8 hours.  Darletta Moll Chest Port 1 View  Result Date: 03/05/2022 CLINICAL DATA:  Acute respiratory failure with hypoxia and hypercarbia EXAM: PORTABLE CHEST 1 VIEW COMPARISON:  March 01, 2022 FINDINGS: Stable support apparatus. Unchanged cardiomegaly and mediastinal contours. Similar mild bilateral reticular pulmonary opacities. No large pneumothorax or pleural effusion. The visualized upper abdomen is unremarkable. No acute osseous abnormality. IMPRESSION: Unchanged mild pulmonary edema. Electronically Signed   By: Beryle Flock M.D.   On: 03/05/2022 08:12   US Venous Img Lower Bilateral (DVT)  Result Date: 03/04/2022 CLINICAL DATA:  46 year old with bilateral lower extremity swelling. EXAM: BILATERAL LOWER EXTREMITY VENOUS DOPPLER ULTRASOUND TECHNIQUE: Gray-scale sonography with graded compression, as  well as color Doppler and duplex ultrasound were performed to evaluate the lower extremity deep venous systems from the level of the common femoral vein and including the common femoral, femoral, profunda femoral, popliteal and calf veins  including the posterior tibial, peroneal and gastrocnemius veins when visible. The superficial great saphenous vein was also interrogated. Spectral Doppler was utilized to evaluate flow at rest and with distal augmentation maneuvers in the common femoral, femoral and popliteal veins. COMPARISON:  02/15/2022 FINDINGS: RIGHT LOWER EXTREMITY Common Femoral Vein: No evidence of thrombus. Normal compressibility, respiratory phasicity and response to augmentation. Saphenofemoral Junction: No evidence of thrombus. Normal compressibility and flow on color Doppler imaging. Profunda Femoral Vein: No evidence of thrombus. Normal compressibility and flow on color Doppler imaging. Femoral Vein: No evidence of thrombus. Normal compressibility, respiratory phasicity and response to augmentation. Popliteal Vein: No evidence of thrombus. Normal compressibility, respiratory phasicity and response to augmentation. Calf Veins: No evidence of thrombus. Normal compressibility and flow on color Doppler imaging. LEFT LOWER EXTREMITY Common Femoral Vein: No evidence of thrombus. Normal compressibility, respiratory phasicity and response to augmentation. Saphenofemoral Junction: No evidence of thrombus. Normal compressibility and flow on color Doppler imaging. Profunda Femoral Vein: No evidence of thrombus. Normal compressibility and flow on color Doppler imaging. Femoral Vein: No evidence of thrombus. Normal compressibility, respiratory phasicity and response to augmentation. Popliteal Vein: No evidence of thrombus. Normal compressibility, respiratory phasicity and response to augmentation. Calf Veins: No evidence of thrombus. Normal compressibility and flow on color Doppler imaging. IMPRESSION: No  evidence of deep venous thrombosis in either lower extremity. Electronically Signed   By: Markus Daft M.D.   On: 03/04/2022 20:16   US RENAL  Result Date: 03/04/2022 CLINICAL DATA:  Acute renal failure EXAM: RENAL / URINARY TRACT ULTRASOUND COMPLETE COMPARISON:  None Available. FINDINGS: Right Kidney: Renal measurements: 13.7 x 6.9 x 6.2 cm = volume: 310 mL. Echogenicity within normal limits. No mass or hydronephrosis visualized. Left Kidney: Renal measurements: 12.1 x 5.9 x 5.0 cm = volume: 180 mL. Echogenicity within normal limits. No mass or hydronephrosis visualized. Bladder: Poorly visualized due to body habitus and decompression. Other: None. IMPRESSION: 1. No ultrasound abnormality of the kidneys. No evidence of hydronephrosis. 2. Bladder poorly visualized due to body habitus and decompression. Electronically Signed   By: Delanna Ahmadi M.D.   On: 03/04/2022 16:04   MR CERVICAL SPINE WO CONTRAST  Result Date: 03/04/2022 CLINICAL DATA:  Chronic cervical myelopathy. EXAM: MRI CERVICAL SPINE WITHOUT CONTRAST TECHNIQUE: Multiplanar, multisequence MR imaging of the cervical spine was performed. No intravenous contrast was administered. COMPARISON:  CT cervical spine 12/16/2018 FINDINGS: Alignment: Suboptimal image quality due to large patient size. Normal alignment Vertebrae: Negative for fracture or mass Cord: Suboptimal evaluation of the cord. No cord compression or signal abnormality identified given the decreased resolution. Posterior Fossa, vertebral arteries, paraspinal tissues: Negative Disc levels: C2-3: Negative C3-4: Mild spinal stenosis and mild foraminal stenosis bilaterally due to disc bulging and spurring C4-5: Negative C5-6: Negative C6-7: Negative C7-T1: Negative IMPRESSION: Examination quality is limited due to large patient size. Mild spinal and foraminal stenosis at C3-4.  Otherwise negative Electronically Signed   By: Franchot Gallo M.D.   On: 03/04/2022 15:04   MR BRAIN WO  CONTRAST  Result Date: 03/04/2022 CLINICAL DATA:  Mental status change.  Severe toxic encephalopathy. EXAM: MRI HEAD WITHOUT CONTRAST TECHNIQUE: Multiplanar, multiecho pulse sequences of the brain and surrounding structures were obtained without intravenous contrast. COMPARISON:  CT head 02/21/2022 FINDINGS: Brain: No acute infarction, hemorrhage, hydrocephalus, extra-axial collection or mass lesion. Normal white matter. Vascular: Normal arterial flow voids Skull and upper cervical spine: No focal skeletal lesion. Sinuses/Orbits: Mucosal edema paranasal sinuses. Bilateral mastoid effusion. Negative  orbit Other: None IMPRESSION: Normal MRI of the brain Mucosal edema in the paranasal sinuses and mastoid sinus bilaterally. Electronically Signed   By: Franchot Gallo M.D.   On: 03/04/2022 14:56  .   ASSESSMENT: s/p trach. Minor skin erosion from trach flange which is now better protected by dressing with the retention sutures removed, as the staff can better position the trach dressing to protect the skin  PLAN: Will sign off. Reconsult as needed.    [name], MD 03/05/2022 10:28 AM

## 2022-03-05 NOTE — Progress Notes (Signed)
Eeg done 

## 2022-03-05 NOTE — Progress Notes (Signed)
[facility]  PROGRESS NOTE   Subjective:   Patient seen and evaluated at the bedside in ICU Eyes open today, remains unresponsive to name or questions Daughter at bedside Remains on vent 50% Tube feeds at 70 ml/hr  Objective:  Vital signs: Blood pressure 122/70, pulse 97, temperature 99 F (37.2 C), temperature source Oral, resp. rate (!) 30, height $RemoveBe'5\' 9"'asPlwchOj$  (1.753 m), weight (!) 205 kg, SpO2 95 %.  Intake/Output Summary (Last 24 hours) at 03/05/2022 1124 Last data filed at 03/05/2022 0400 Gross per 24 hour  Intake 0 ml  Output --  Net 0 ml    Filed Weights   02/28/22 0429 03/02/22 0155 03/05/22 0409  Weight: (!) 196 kg (!) 197.4 kg (!) 205 kg     Physical Exam: General:  No acute distress, resting comfortably  Head:  Normocephalic, atraumatic. Moist oral mucosal membranes  Eyes:  Anicteric  Lungs:   Trach on vent  Heart:  S1S2 no rubs  Abdomen:   Soft, nontender, bowel sounds present  Extremities: 1+ peripheral edema.  Neurologic: Response to pain only  Skin:  No lesions  Access: Right IJ temp cath placed on 5/91/63    Basic Metabolic Panel: Recent Labs  Lab 02/28/22 2318 03/01/22 0520 03/01/22 1042 03/01/22 1401 03/01/22 1721 03/02/22 0313 03/02/22 0720 03/02/22 1756 03/02/22 2344 03/03/22 0400 03/03/22 0511 03/03/22 1536 03/04/22 0440 03/05/22 0419  NA  --  143  --  140   < > 139  --  135  --   --  131* 135 133* 134*  K 6.5* 6.3*   < > 7.0*   < > 5.9*   < > 4.6   < > 5.1 5.3* 5.4* 4.1 4.8  CL  --  108  --  105   < > 101  --  96*  --   --  94* 96* 96* 97*  CO2  --  23  --  24   < > 27  --  28  --   --  $R'22 24 25 23  'Ol$ GLUCOSE  --  183*  --  191*   < > 179*  --  152*  --   --  249* 175* 167* 141*  BUN  --  121*  --  128*   < > 106*  --  82*  --   --  98* 105* 91* 118*  CREATININE  --  4.79*  --  5.18*   < > 4.78*  --  3.95*  --   --  4.78* 5.60* 4.23* 5.73*  CALCIUM  --  7.8*  --  7.8*   < > 7.6*  --  7.5*  --   --  7.2* 7.3* 7.3* 7.4*  MG  3.2* 3.3*  --   --   --  3.0*  --   --   --   --  2.9*  --  2.5*  --   PHOS  --  10.1*  --  11.3*  --  11.7*  --   --   --   --  10.8*  --  9.5* >30.0*   < > = values in this interval not displayed.     CBC: Recent Labs  Lab 03/01/22 0520 03/01/22 1721 03/02/22 0313 03/03/22 0511 03/04/22 0440 03/05/22 0419  WBC 14.4* 16.2* 16.9* 13.1* 10.8* 10.8*  NEUTROABS 12.4*  --  15.0* 11.3* 8.8* 8.6*  HGB 8.6* 8.9* 9.0* 8.1* 7.9* 8.1*  HCT 30.4* 30.7*  31.8* 28.6* 27.0* 27.4*  MCV 94.7 92.2 95.5 93.8 90.6 88.7  PLT 187 208 199 198 195 229      Urinalysis: No results for input(s): "COLORURINE", "LABSPEC", "PHURINE", "GLUCOSEU", "HGBUR", "BILIRUBINUR", "KETONESUR", "PROTEINUR", "UROBILINOGEN", "NITRITE", "LEUKOCYTESUR" in the last 72 hours.  Invalid input(s): "APPERANCEUR"     Imaging: DG Chest Port 1 View  Result Date: 03/05/2022 CLINICAL DATA:  Acute respiratory failure with hypoxia and hypercarbia EXAM: PORTABLE CHEST 1 VIEW COMPARISON:  March 01, 2022 FINDINGS: Stable support apparatus. Unchanged cardiomegaly and mediastinal contours. Similar mild bilateral reticular pulmonary opacities. No large pneumothorax or pleural effusion. The visualized upper abdomen is unremarkable. No acute osseous abnormality. IMPRESSION: Unchanged mild pulmonary edema. Electronically Signed   By: Beryle Flock M.D.   On: 03/05/2022 08:12   US Venous Img Lower Bilateral (DVT)  Result Date: 03/04/2022 CLINICAL DATA:  46 year old with bilateral lower extremity swelling. EXAM: BILATERAL LOWER EXTREMITY VENOUS DOPPLER ULTRASOUND TECHNIQUE: Gray-scale sonography with graded compression, as well as color Doppler and duplex ultrasound were performed to evaluate the lower extremity deep venous systems from the level of the common femoral vein and including the common femoral, femoral, profunda femoral, popliteal and calf veins including the posterior tibial, peroneal and gastrocnemius veins when visible. The  superficial great saphenous vein was also interrogated. Spectral Doppler was utilized to evaluate flow at rest and with distal augmentation maneuvers in the common femoral, femoral and popliteal veins. COMPARISON:  02/15/2022 FINDINGS: RIGHT LOWER EXTREMITY Common Femoral Vein: No evidence of thrombus. Normal compressibility, respiratory phasicity and response to augmentation. Saphenofemoral Junction: No evidence of thrombus. Normal compressibility and flow on color Doppler imaging. Profunda Femoral Vein: No evidence of thrombus. Normal compressibility and flow on color Doppler imaging. Femoral Vein: No evidence of thrombus. Normal compressibility, respiratory phasicity and response to augmentation. Popliteal Vein: No evidence of thrombus. Normal compressibility, respiratory phasicity and response to augmentation. Calf Veins: No evidence of thrombus. Normal compressibility and flow on color Doppler imaging. LEFT LOWER EXTREMITY Common Femoral Vein: No evidence of thrombus. Normal compressibility, respiratory phasicity and response to augmentation. Saphenofemoral Junction: No evidence of thrombus. Normal compressibility and flow on color Doppler imaging. Profunda Femoral Vein: No evidence of thrombus. Normal compressibility and flow on color Doppler imaging. Femoral Vein: No evidence of thrombus. Normal compressibility, respiratory phasicity and response to augmentation. Popliteal Vein: No evidence of thrombus. Normal compressibility, respiratory phasicity and response to augmentation. Calf Veins: No evidence of thrombus. Normal compressibility and flow on color Doppler imaging. IMPRESSION: No evidence of deep venous thrombosis in either lower extremity. Electronically Signed   By: Markus Daft M.D.   On: 03/04/2022 20:16   US RENAL  Result Date: 03/04/2022 CLINICAL DATA:  Acute renal failure EXAM: RENAL / URINARY TRACT ULTRASOUND COMPLETE COMPARISON:  None Available. FINDINGS: Right Kidney: Renal measurements:  13.7 x 6.9 x 6.2 cm = volume: 310 mL. Echogenicity within normal limits. No mass or hydronephrosis visualized. Left Kidney: Renal measurements: 12.1 x 5.9 x 5.0 cm = volume: 180 mL. Echogenicity within normal limits. No mass or hydronephrosis visualized. Bladder: Poorly visualized due to body habitus and decompression. Other: None. IMPRESSION: 1. No ultrasound abnormality of the kidneys. No evidence of hydronephrosis. 2. Bladder poorly visualized due to body habitus and decompression. Electronically Signed   By: Delanna Ahmadi M.D.   On: 03/04/2022 16:04   MR CERVICAL SPINE WO CONTRAST  Result Date: 03/04/2022 CLINICAL DATA:  Chronic cervical myelopathy. EXAM: MRI CERVICAL SPINE WITHOUT CONTRAST TECHNIQUE: Multiplanar, multisequence  MR imaging of the cervical spine was performed. No intravenous contrast was administered. COMPARISON:  CT cervical spine 12/16/2018 FINDINGS: Alignment: Suboptimal image quality due to large patient size. Normal alignment Vertebrae: Negative for fracture or mass Cord: Suboptimal evaluation of the cord. No cord compression or signal abnormality identified given the decreased resolution. Posterior Fossa, vertebral arteries, paraspinal tissues: Negative Disc levels: C2-3: Negative C3-4: Mild spinal stenosis and mild foraminal stenosis bilaterally due to disc bulging and spurring C4-5: Negative C5-6: Negative C6-7: Negative C7-T1: Negative IMPRESSION: Examination quality is limited due to large patient size. Mild spinal and foraminal stenosis at C3-4.  Otherwise negative Electronically Signed   By: Franchot Gallo M.D.   On: 03/04/2022 15:04   MR BRAIN WO CONTRAST  Result Date: 03/04/2022 CLINICAL DATA:  Mental status change.  Severe toxic encephalopathy. EXAM: MRI HEAD WITHOUT CONTRAST TECHNIQUE: Multiplanar, multiecho pulse sequences of the brain and surrounding structures were obtained without intravenous contrast. COMPARISON:  CT head 02/21/2022 FINDINGS: Brain: No acute  infarction, hemorrhage, hydrocephalus, extra-axial collection or mass lesion. Normal white matter. Vascular: Normal arterial flow voids Skull and upper cervical spine: No focal skeletal lesion. Sinuses/Orbits: Mucosal edema paranasal sinuses. Bilateral mastoid effusion. Negative orbit Other: None IMPRESSION: Normal MRI of the brain Mucosal edema in the paranasal sinuses and mastoid sinus bilaterally. Electronically Signed   By: Franchot Gallo M.D.   On: 03/04/2022 14:56     Medications:    sodium chloride 250 mL (03/03/22 2253)   ampicillin-sulbactam (UNASYN) IV 3 g (03/05/22 4709)   anticoagulant sodium citrate     anticoagulant sodium citrate     feeding supplement (VITAL 1.5 CAL) 70 mL/hr at 03/05/22 0400    Chlorhexidine Gluconate Cloth  6 each Topical Q0600   famotidine  20 mg Per Tube QHS   feeding supplement (PROSource TF)  90 mL Per Tube TID   free water  30 mL Per Tube Q4H   heparin injection (subcutaneous)  5,000 Units Subcutaneous Q8H   insulin aspart  0-20 Units Subcutaneous Q4H   insulin aspart  4 Units Subcutaneous Q4H   insulin detemir  22 Units Subcutaneous BID   insulin starter kit- pen needles  1 kit Other Once   ipratropium-albuterol  3 mL Nebulization Q6H   linezolid  600 mg Per Tube Q12H   living well with diabetes book   Does not apply Once   multivitamin  1 tablet Per Tube QHS   mupirocin ointment   Nasal BID   mouth rinse  15 mL Mouth Rinse Q2H   sodium chloride flush  10-40 mL Intracatheter Q12H    Assessment/ Plan:     Principal Problem:   Acute respiratory failure with hypoxia and hypercarbia (HCC) Active Problems:   Acute on chronic systolic CHF (congestive heart failure) (HCC)   Hypertensive urgency   Type 2 diabetes mellitus with peripheral neuropathy (HCC)   Acute heart failure with preserved ejection fraction (HCC)   Renal failure (ARF), acute on chronic (HCC)   Hospital-acquired pneumonia   ARDS (adult respiratory distress syndrome) ([place])  46  y.o. male with medical problems of poorly controlled diabetes, hypertension, systolic CHF, obstructive sleep apnea, obesity    admitted on 02/11/2022 for Acute respiratory failure (HCC) [J96.00] Respiratory failure (HCC) [J96.90] Peripheral edema [R60.9] Acute respiratory failure with hypoxia and hypercapnia (HCC) [J96.01, J96.02]   #1: Acute kidney injury: Patient with worsening renal insufficiency.  Acute kidney injury is most likely secondary to acute tubular necrosis possibly due to  hypotension, fever and sepsis.  Patient became oligoanuric with metabolic acidosis and hyperkalemia.    Will receive dialysis later today. Remains anuric.   #2: Hyperkalemia: Corrected with dialysis, 4.8   #3: Congestive heart failure/fluid overload: Will continue to manage fluid status with dialysis.    #4: Vent dependent respiratory failure: Now with tracheostomy.    #5: Sepsis: Continue linezolid.    LOS: [facility] 8/16/202311:24 AM

## 2022-03-06 DIAGNOSIS — J9601 Acute respiratory failure with hypoxia: Secondary | ICD-10-CM | POA: Diagnosis not present

## 2022-03-06 DIAGNOSIS — J9602 Acute respiratory failure with hypercapnia: Secondary | ICD-10-CM | POA: Diagnosis not present

## 2022-03-06 LAB — TSH: TSH: 3.855 u[IU]/mL (ref 0.350–4.500)

## 2022-03-06 LAB — BASIC METABOLIC PANEL
Anion gap: 12 (ref 5–15)
BUN: 97 mg/dL — ABNORMAL HIGH (ref 6–20)
CO2: 24 mmol/L (ref 22–32)
Calcium: 7.5 mg/dL — ABNORMAL LOW (ref 8.9–10.3)
Chloride: 98 mmol/L (ref 98–111)
Creatinine, Ser: 5.11 mg/dL — ABNORMAL HIGH (ref 0.61–1.24)
GFR, Estimated: 13 mL/min — ABNORMAL LOW (ref >60)
Glucose, Bld: 143 mg/dL — ABNORMAL HIGH (ref 70–99)
Potassium: 5.2 mmol/L — ABNORMAL HIGH (ref 3.5–5.1)
Sodium: 134 mmol/L — ABNORMAL LOW (ref 135–145)

## 2022-03-06 LAB — PHOSPHORUS: Phosphorus: 10.3 mg/dL — ABNORMAL HIGH (ref 2.5–4.6)

## 2022-03-06 LAB — CBC WITH DIFFERENTIAL/PLATELET
Abs Immature Granulocytes: 0.1 10*3/uL — ABNORMAL HIGH (ref 0.00–0.07)
Basophils Absolute: 0 10*3/uL (ref 0.0–0.1)
Basophils Relative: 0 %
Eosinophils Absolute: 0.3 10*3/uL (ref 0.0–0.5)
Eosinophils Relative: 3 %
HCT: 27.3 % — ABNORMAL LOW (ref 39.0–52.0)
Hemoglobin: 8.3 g/dL — ABNORMAL LOW (ref 13.0–17.0)
Immature Granulocytes: 1 %
Lymphocytes Relative: 8 %
Lymphs Abs: 0.8 10*3/uL (ref 0.7–4.0)
MCH: 26.3 pg (ref 26.0–34.0)
MCHC: 30.4 g/dL (ref 30.0–36.0)
MCV: 86.4 fL (ref 80.0–100.0)
Monocytes Absolute: 0.9 10*3/uL (ref 0.1–1.0)
Monocytes Relative: 9 %
Neutro Abs: 7.6 10*3/uL (ref 1.7–7.7)
Neutrophils Relative %: 79 %
Platelets: 249 10*3/uL (ref 150–400)
RBC: 3.16 MIL/uL — ABNORMAL LOW (ref 4.22–5.81)
RDW: 15.4 % (ref 11.5–15.5)
WBC: 9.8 10*3/uL (ref 4.0–10.5)
nRBC: 0 % (ref 0.0–0.2)

## 2022-03-06 LAB — GLUCOSE, CAPILLARY
Glucose-Capillary: 141 mg/dL — ABNORMAL HIGH (ref 70–99)
Glucose-Capillary: 128 mg/dL — ABNORMAL HIGH (ref 70–99)
Glucose-Capillary: 126 mg/dL — ABNORMAL HIGH (ref 70–99)
Glucose-Capillary: 116 mg/dL — ABNORMAL HIGH (ref 70–99)
Glucose-Capillary: 132 mg/dL — ABNORMAL HIGH (ref 70–99)

## 2022-03-06 LAB — HEPARIN LEVEL (UNFRACTIONATED)
Heparin Unfractionated: <0.1 IU/mL — ABNORMAL LOW (ref 0.30–0.70)
Heparin Unfractionated: 0.15 IU/mL — ABNORMAL LOW (ref 0.30–0.70)

## 2022-03-06 LAB — AMMONIA: Ammonia: 26 umol/L (ref 9–35)

## 2022-03-06 LAB — VITAMIN D 25 HYDROXY (VIT D DEFICIENCY, FRACTURES): Vit D, 25-Hydroxy: 20.35 ng/mL — ABNORMAL LOW (ref 30–100)

## 2022-03-06 LAB — C-REACTIVE PROTEIN: CRP: 8.6 mg/dL — ABNORMAL HIGH (ref <1.0)

## 2022-03-06 MED ORDER — HEPARIN (PORCINE) 25000 UT/250ML-% IV SOLN
2900.0000 [IU]/h | INTRAVENOUS | Status: DC
  Administered 2022-03-06 (×2): 2200 [IU]/h via INTRAVENOUS
  Administered 2022-03-07 (×2): 2800 [IU]/h via INTRAVENOUS
  Administered 2022-03-07: 2600 [IU]/h via INTRAVENOUS
  Administered 2022-03-08 – 2022-03-11 (×11): 2800 [IU]/h via INTRAVENOUS
  Administered 2022-03-12 (×2): 2900 [IU]/h via INTRAVENOUS
  Administered 2022-03-12: 2800 [IU]/h via INTRAVENOUS
  Administered 2022-03-13 – 2022-03-14 (×3): 2900 [IU]/h via INTRAVENOUS
  Filled 2022-03-06 (×21): qty 250

## 2022-03-06 MED ORDER — HEPARIN BOLUS VIA INFUSION
3500.0000 [IU] | Freq: Once | INTRAVENOUS | Status: AC
  Administered 2022-03-06: 3500 [IU] via INTRAVENOUS
  Filled 2022-03-06: qty 3500

## 2022-03-06 MED ORDER — BANATROL TF EN LIQD
60.0000 mL | Freq: Two times a day (BID) | ENTERAL | Status: DC
  Administered 2022-03-07 – 2022-03-12 (×9): 60 mL
  Filled 2022-03-06 (×10): qty 60

## 2022-03-06 MED ORDER — AMOXICILLIN-POT CLAVULANATE 500-125 MG PO TABS
1.0000 | ORAL_TABLET | Freq: Two times a day (BID) | ORAL | Status: AC
  Administered 2022-03-06 – 2022-03-09 (×7): 500 mg
  Filled 2022-03-06 (×8): qty 1

## 2022-03-06 MED ORDER — LOPERAMIDE HCL 2 MG PO CAPS
2.0000 mg | ORAL_CAPSULE | ORAL | Status: DC | PRN
  Administered 2022-03-07 – 2022-03-08 (×3): 2 mg via ORAL
  Filled 2022-03-06 (×4): qty 1

## 2022-03-06 NOTE — Progress Notes (Signed)
[facility]  PROGRESS NOTE   Subjective:   Patient seen and evaluated at the bedside in ICU Eyes open today, remains unresponsive to name or questions Daughter at bedside Vent weaned to 40% Remains on tube feeds at 70 ml/hr  Objective:  Vital signs: Blood pressure (!) 140/71, pulse (!) 27, temperature (!) 100.5 F (38.1 C), resp. rate (!) 35, height $RemoveBe'5\' 9"'eLynsmNTR$  (1.753 m), weight (!) 204 kg, SpO2 100 %.  Intake/Output Summary (Last 24 hours) at 03/06/2022 1126 Last data filed at 03/06/2022 1047 Gross per 24 hour  Intake 77.34 ml  Output 2015 ml  Net -1937.66 ml    Filed Weights   03/02/22 0155 03/05/22 0409 03/06/22 0500  Weight: (!) 197.4 kg (!) 205 kg (!) 204 kg     Physical Exam: General:  No acute distress, resting comfortably  Head:  Normocephalic, atraumatic. Moist oral mucosal membranes  Eyes:  Anicteric  Lungs:   Trach on vent  Heart:  S1S2 no rubs  Abdomen:   Soft, nontender, bowel sounds present  Extremities: 1+ peripheral edema.  Neurologic: Response to pain only  Skin:  No lesions  Access: Right IJ temp cath placed on 10/28/79    Basic Metabolic Panel: Recent Labs  Lab 02/28/22 2318 03/01/22 0520 03/01/22 1042 03/02/22 0313 03/02/22 0720 03/03/22 0511 03/03/22 1536 03/04/22 0440 03/05/22 0419 03/06/22 0740  NA  --  143   < > 139   < > 131* 135 133* 134* 134*  K 6.5* 6.3*   < > 5.9*   < > 5.3* 5.4* 4.1 4.8 5.2*  CL  --  108   < > 101   < > 94* 96* 96* 97* 98  CO2  --  23   < > 27   < > $R'22 24 25 23 24  'VY$ GLUCOSE  --  183*   < > 179*   < > 249* 175* 167* 141* 143*  BUN  --  121*   < > 106*   < > 98* 105* 91* 118* 97*  CREATININE  --  4.79*   < > 4.78*   < > 4.78* 5.60* 4.23* 5.73* 5.11*  CALCIUM  --  7.8*   < > 7.6*   < > 7.2* 7.3* 7.3* 7.4* 7.5*  MG 3.2* 3.3*  --  3.0*  --  2.9*  --  2.5*  --   --   PHOS  --  10.1*   < > 11.7*  --  10.8*  --  9.5* >30.0* 10.3*   < > = values in this interval not displayed.     CBC: Recent Labs  Lab  03/02/22 0313 03/03/22 0511 03/04/22 0440 03/05/22 0419 03/06/22 0740  WBC 16.9* 13.1* 10.8* 10.8* 9.8  NEUTROABS 15.0* 11.3* 8.8* 8.6* 7.6  HGB 9.0* 8.1* 7.9* 8.1* 8.3*  HCT 31.8* 28.6* 27.0* 27.4* 27.3*  MCV 95.5 93.8 90.6 88.7 86.4  PLT 199 198 195 229 249      Urinalysis: No results for input(s): "COLORURINE", "LABSPEC", "PHURINE", "GLUCOSEU", "HGBUR", "BILIRUBINUR", "KETONESUR", "PROTEINUR", "UROBILINOGEN", "NITRITE", "LEUKOCYTESUR" in the last 72 hours.  Invalid input(s): "APPERANCEUR"     Imaging: EEG adult  Result Date: 03/05/2022 Derek Jack, MD     03/05/2022  8:06 PM Routine EEG Report KARVER FADDEN is a 46 y.o. male with a history of encephalopathy who is undergoing an EEG to evaluate for seizures. Report: This EEG was acquired with electrodes placed according to the International 10-20  electrode system (including Fp1, Fp2, F3, F4, C3, C4, P3, P4, O1, O2, T3, T4, T5, T6, A1, A2, Fz, Cz, Pz). The following electrodes were missing or displaced: none. The occipital dominant rhythm was 5 Hz. This activity is reactive to stimulation. Drowsiness was manifested by background fragmentation; deeper stages of sleep were identified by K complexes and sleep spindles. There was no focal slowing. There were no interictal epileptiform discharges. There were no electrographic seizures identified. Photic stimulation and hyperventilation were not performed. Impression and clinical correlation: This EEG was obtained while awake and asleep and is abnormal due to mild diffuse slowing indicative of global cerebral dysfunction. Epileptiform abnormalities were not seen during this recording. Su Monks, MD [facility] [phone] If 7pm- 7am, please page neurology on call as listed in [place].   US Venous Img Upper Bilat (DVT)  Result Date: 03/05/2022 CLINICAL DATA:  Bilateral upper extremity edema and indwelling right upper extremity PICC line. EXAM: BILATERAL UPPER EXTREMITY  VENOUS DOPPLER ULTRASOUND TECHNIQUE: Gray-scale sonography with graded compression, as well as color Doppler and duplex ultrasound were performed to evaluate the bilateral upper extremity deep venous systems from the level of the subclavian vein and including the jugular, axillary, basilic, radial, ulnar and upper cephalic vein. Spectral Doppler was utilized to evaluate flow at rest and with distal augmentation maneuvers. COMPARISON:  None Available. FINDINGS: RIGHT UPPER EXTREMITY Internal Jugular Vein: No evidence of thrombus. Normal compressibility, respiratory phasicity and response to augmentation. Subclavian Vein: No evidence of thrombus. Normal compressibility, respiratory phasicity and response to augmentation. Axillary Vein: Occlusive thrombus identified. Cephalic Vein: No evidence of thrombus. Normal compressibility, respiratory phasicity and response to augmentation. Basilic Vein: No evidence of thrombus. Normal compressibility, respiratory phasicity and response to augmentation. Brachial Veins: Paired brachial veins present. The brachial vein containing the indwelling PICC line demonstrates nonocclusive thrombus which is nearly occlusive and demonstrates very little flow surrounding the indwelling PICC line. The other paired brachial vein is normally patent. Radial Veins: No evidence of thrombus. Normal compressibility, respiratory phasicity and response to augmentation. Ulnar Veins: No evidence of thrombus. Normal compressibility, respiratory phasicity and response to augmentation. Venous Reflux:  None. Other Findings: No evidence of superficial thrombophlebitis or abnormal fluid collection. LEFT UPPER EXTREMITY Internal Jugular Vein: No evidence of thrombus. Normal compressibility, respiratory phasicity and response to augmentation. Subclavian Vein: No evidence of thrombus. Normal compressibility, respiratory phasicity and response to augmentation. Axillary Vein: No evidence of thrombus. Normal  compressibility, respiratory phasicity and response to augmentation. Cephalic Vein: No evidence of thrombus. Normal compressibility, respiratory phasicity and response to augmentation. Basilic Vein: No evidence of thrombus. Normal compressibility, respiratory phasicity and response to augmentation. Brachial Veins: No evidence of thrombus. Normal compressibility, respiratory phasicity and response to augmentation. Radial Veins: No evidence of thrombus. Normal compressibility, respiratory phasicity and response to augmentation. Ulnar Veins: No evidence of thrombus. Normal compressibility, respiratory phasicity and response to augmentation. Venous Reflux:  None. Other Findings: No evidence of superficial thrombophlebitis or abnormal fluid collection. IMPRESSION: 1. Non-occlusive/nearly occlusive thrombus in one of paired right upper arm brachial veins containing the indwelling PICC line and occlusive thrombus in the right axillary vein. The visualized segment of the right subclavian vein is patent. 2. No evidence of left upper extremity DVT. Electronically Signed   By: Aletta Edouard M.D.   On: 03/05/2022 14:22   DG Chest Port 1 View  Result Date: 03/05/2022 CLINICAL DATA:  Acute respiratory failure with hypoxia and hypercarbia EXAM: PORTABLE CHEST 1 VIEW COMPARISON:  March 01, 2022 FINDINGS: Stable support apparatus. Unchanged cardiomegaly and mediastinal contours. Similar mild bilateral reticular pulmonary opacities. No large pneumothorax or pleural effusion. The visualized upper abdomen is unremarkable. No acute osseous abnormality. IMPRESSION: Unchanged mild pulmonary edema. Electronically Signed   By: Beryle Flock M.D.   On: 03/05/2022 08:12   US Venous Img Lower Bilateral (DVT)  Result Date: 03/04/2022 CLINICAL DATA:  46 year old with bilateral lower extremity swelling. EXAM: BILATERAL LOWER EXTREMITY VENOUS DOPPLER ULTRASOUND TECHNIQUE: Gray-scale sonography with graded compression, as well as  color Doppler and duplex ultrasound were performed to evaluate the lower extremity deep venous systems from the level of the common femoral vein and including the common femoral, femoral, profunda femoral, popliteal and calf veins including the posterior tibial, peroneal and gastrocnemius veins when visible. The superficial great saphenous vein was also interrogated. Spectral Doppler was utilized to evaluate flow at rest and with distal augmentation maneuvers in the common femoral, femoral and popliteal veins. COMPARISON:  02/15/2022 FINDINGS: RIGHT LOWER EXTREMITY Common Femoral Vein: No evidence of thrombus. Normal compressibility, respiratory phasicity and response to augmentation. Saphenofemoral Junction: No evidence of thrombus. Normal compressibility and flow on color Doppler imaging. Profunda Femoral Vein: No evidence of thrombus. Normal compressibility and flow on color Doppler imaging. Femoral Vein: No evidence of thrombus. Normal compressibility, respiratory phasicity and response to augmentation. Popliteal Vein: No evidence of thrombus. Normal compressibility, respiratory phasicity and response to augmentation. Calf Veins: No evidence of thrombus. Normal compressibility and flow on color Doppler imaging. LEFT LOWER EXTREMITY Common Femoral Vein: No evidence of thrombus. Normal compressibility, respiratory phasicity and response to augmentation. Saphenofemoral Junction: No evidence of thrombus. Normal compressibility and flow on color Doppler imaging. Profunda Femoral Vein: No evidence of thrombus. Normal compressibility and flow on color Doppler imaging. Femoral Vein: No evidence of thrombus. Normal compressibility, respiratory phasicity and response to augmentation. Popliteal Vein: No evidence of thrombus. Normal compressibility, respiratory phasicity and response to augmentation. Calf Veins: No evidence of thrombus. Normal compressibility and flow on color Doppler imaging. IMPRESSION: No evidence of  deep venous thrombosis in either lower extremity. Electronically Signed   By: Markus Daft M.D.   On: 03/04/2022 20:16   US RENAL  Result Date: 03/04/2022 CLINICAL DATA:  Acute renal failure EXAM: RENAL / URINARY TRACT ULTRASOUND COMPLETE COMPARISON:  None Available. FINDINGS: Right Kidney: Renal measurements: 13.7 x 6.9 x 6.2 cm = volume: 310 mL. Echogenicity within normal limits. No mass or hydronephrosis visualized. Left Kidney: Renal measurements: 12.1 x 5.9 x 5.0 cm = volume: 180 mL. Echogenicity within normal limits. No mass or hydronephrosis visualized. Bladder: Poorly visualized due to body habitus and decompression. Other: None. IMPRESSION: 1. No ultrasound abnormality of the kidneys. No evidence of hydronephrosis. 2. Bladder poorly visualized due to body habitus and decompression. Electronically Signed   By: Delanna Ahmadi M.D.   On: 03/04/2022 16:04   MR CERVICAL SPINE WO CONTRAST  Result Date: 03/04/2022 CLINICAL DATA:  Chronic cervical myelopathy. EXAM: MRI CERVICAL SPINE WITHOUT CONTRAST TECHNIQUE: Multiplanar, multisequence MR imaging of the cervical spine was performed. No intravenous contrast was administered. COMPARISON:  CT cervical spine 12/16/2018 FINDINGS: Alignment: Suboptimal image quality due to large patient size. Normal alignment Vertebrae: Negative for fracture or mass Cord: Suboptimal evaluation of the cord. No cord compression or signal abnormality identified given the decreased resolution. Posterior Fossa, vertebral arteries, paraspinal tissues: Negative Disc levels: C2-3: Negative C3-4: Mild spinal stenosis and mild foraminal stenosis bilaterally due to disc bulging and spurring C4-5: Negative  C5-6: Negative C6-7: Negative C7-T1: Negative IMPRESSION: Examination quality is limited due to large patient size. Mild spinal and foraminal stenosis at C3-4.  Otherwise negative Electronically Signed   By: Franchot Gallo M.D.   On: 03/04/2022 15:04   MR BRAIN WO CONTRAST  Result Date:  03/04/2022 CLINICAL DATA:  Mental status change.  Severe toxic encephalopathy. EXAM: MRI HEAD WITHOUT CONTRAST TECHNIQUE: Multiplanar, multiecho pulse sequences of the brain and surrounding structures were obtained without intravenous contrast. COMPARISON:  CT head 02/21/2022 FINDINGS: Brain: No acute infarction, hemorrhage, hydrocephalus, extra-axial collection or mass lesion. Normal white matter. Vascular: Normal arterial flow voids Skull and upper cervical spine: No focal skeletal lesion. Sinuses/Orbits: Mucosal edema paranasal sinuses. Bilateral mastoid effusion. Negative orbit Other: None IMPRESSION: Normal MRI of the brain Mucosal edema in the paranasal sinuses and mastoid sinus bilaterally. Electronically Signed   By: Franchot Gallo M.D.   On: 03/04/2022 14:56     Medications:    sodium chloride 250 mL (03/03/22 2253)   anticoagulant sodium citrate     anticoagulant sodium citrate     feeding supplement (VITAL 1.5 CAL) 70 mL/hr at 03/06/22 0600   heparin 2,200 Units/hr (03/06/22 0600)    amoxicillin-clavulanate  1 tablet Per Tube BID   Chlorhexidine Gluconate Cloth  6 each Topical Q0600   famotidine  20 mg Per Tube QHS   feeding supplement (PROSource TF)  90 mL Per Tube TID   free water  30 mL Per Tube Q4H   insulin aspart  0-20 Units Subcutaneous Q4H   insulin aspart  4 Units Subcutaneous Q4H   insulin detemir  22 Units Subcutaneous BID   insulin starter kit- pen needles  1 kit Other Once   ipratropium-albuterol  3 mL Nebulization Q6H   linezolid  600 mg Per Tube Q12H   living well with diabetes book   Does not apply Once   multivitamin  1 tablet Per Tube QHS   mupirocin ointment   Nasal BID   mouth rinse  15 mL Mouth Rinse Q2H   sodium chloride flush  10-40 mL Intracatheter Q12H    Assessment/ Plan:     Principal Problem:   Acute respiratory failure with hypoxia and hypercarbia (HCC) Active Problems:   Acute on chronic systolic CHF (congestive heart failure) (HCC)    Hypertensive urgency   Type 2 diabetes mellitus with peripheral neuropathy (HCC)   Acute heart failure with preserved ejection fraction (HCC)   Renal failure (ARF), acute on chronic (HCC)   Hospital-acquired pneumonia   ARDS (adult respiratory distress syndrome) ([place])  46 y.o. male with medical problems of poorly controlled diabetes, hypertension, systolic CHF, obstructive sleep apnea, obesity    admitted on 02/11/2022 for Acute respiratory failure (HCC) [J96.00] Respiratory failure (HCC) [J96.90] Peripheral edema [R60.9] Acute respiratory failure with hypoxia and hypercapnia (HCC) [J96.01, J96.02]   #1: Acute kidney injury: Patient with worsening renal insufficiency.  Acute kidney injury is most likely secondary to acute tubular necrosis possibly due to hypotension, fever and sepsis.  Patient became oligoanuric with metabolic acidosis and hyperkalemia.    Received dialysis yesterday, UF 2L removed. Next treatment scheduled for Friday. Plan to hold dialysis for the weekend and monitor for renal recovery. Will determine if outpatient treatments are needed.   #2: Hyperkalemia: Will correct with dialysis, 5.2 today.    #3: Congestive heart failure/fluid overload: Will manage fluid with dialysis.    #4: Vent dependent respiratory failure: Now with tracheostomy.    #5: Sepsis:  Continue linezolid, Started on augmentin today.     LOS: [facility] 8/17/202311:26 AM

## 2022-03-06 NOTE — Progress Notes (Signed)
[place] of RUE showed a non-occlusive/nearly occlusive thrombus in one of paired right upper arm brachial veins containing the indwelling PICC line and occlusive thrombus in the right axillary vein. The visualized segment of the right subclavian vein is patent. [place] notified. I spoke with Dr. Oletta Darter who ordered for Heparin to be started. IV Team consulted & placed two peripherals in the LUE. Heparin started @ 2200 units without incident.

## 2022-03-06 NOTE — Progress Notes (Signed)
[place] for IV Heparin  Indication: RUE DVT associated with PICC  Patient Measurements: Height: 5\' 9"  (175.3 cm) Weight: (!) 204 kg (449 lb 11.8 oz) IBW/kg (Calculated) : 70.7 Heparin Dosing Weight: 123.4 kg   Labs: Recent Labs    03/04/22 0440 03/05/22 0419 03/06/22 0740 03/06/22 1342  HGB 7.9* 8.1* 8.3*  --   HCT 27.0* 27.4* 27.3*  --   PLT 195 229 249  --   HEPARINUNFRC  --   --   --  <0.10*  CREATININE 4.23* 5.73* 5.11*  --     Estimated Creatinine Clearance: 32.4 mL/min (A) (by C-G formula based on SCr of 5.11 mg/dL (H)).  Medical History: Past Medical History:  Diagnosis Date   Diabetes mellitus without complication ([place])    Hypertension    Pancreatitis    Assessment: Patient is a 46 y/o M with medical history including DM, HTN, pancreatitis, tobacco use disorder, systolic CHF, OSA, DM c/b diabetic neuropathy, lumbar radiculopathy, morbid obesity who is admitted with acute respiratory failure in setting of acute CHF, hypertensive urgency, and pneumonia. Patient with prolonged admission on ventilator complicated by acute renal failure. Upper extremity doppler noted "non-occlusive/nearly occlusive thrombus in one of paired right upper arm brachial veins containing the indwelling PICC line and occlusive thrombus in the right axillary vein". Pharmacy consulted to initiate and manage IV heparin for RUE DVT.  Patient is a hard stick and PICC has since been removed  Of note, patient was previously on heparin this admission and was therapeutic at 2000 units/hr. If difficulty obtaining blood draws, low threshold to empirically decrease infusion to this rate  Goal of Therapy:  Heparin level 0.3-0.7 units/ml Monitor platelets by anticoagulation protocol: Yes   Plan:  --HL below detectable threshold. Discussed with RN, drip was off earlier today in preparation for PEG tube placement but procedure was canceled. Drip was then re-started at  around ~1300 --Continue heparin infusion at 2200 units/hr --Will check HL at 1900 --Daily CBC per protocol while on IV heparin --Plan to transition to apixaban after conclusion of procedures (pending PEG tube placement)  Benita Gutter 03/06/2022,3:18 PM

## 2022-03-06 NOTE — Progress Notes (Signed)
[place] for IV Heparin  Indication: RUE DVT associated with PICC  Patient Measurements: Height: 5\' 9"  (175.3 cm) Weight: (!) 204 kg (449 lb 11.8 oz) IBW/kg (Calculated) : 70.7 Heparin Dosing Weight: 123.4 kg   Labs: Recent Labs    03/04/22 0440 03/05/22 0419 03/06/22 0740 03/06/22 1342 03/06/22 1832  HGB 7.9* 8.1* 8.3*  --   --   HCT 27.0* 27.4* 27.3*  --   --   PLT 195 229 249  --   --   HEPARINUNFRC  --   --   --  <0.10* 0.15*  CREATININE 4.23* 5.73* 5.11*  --   --    Estimated Creatinine Clearance: 32.4 mL/min (A) (by C-G formula based on SCr of 5.11 mg/dL (H)). Heparin Dosing Weight: 123.4 kg   Medical History: Past Medical History:  Diagnosis Date   Diabetes mellitus without complication ([place])    Hypertension    Pancreatitis    Assessment: Patient is a 46 y/o M with medical history including DM, HTN, pancreatitis, tobacco use disorder, systolic CHF, OSA, DM c/b diabetic neuropathy, lumbar radiculopathy, morbid obesity who is admitted with acute respiratory failure in setting of acute CHF, hypertensive urgency, and pneumonia. Patient with prolonged admission on ventilator complicated by acute renal failure. Upper extremity doppler noted "non-occlusive/nearly occlusive thrombus in one of paired right upper arm brachial veins containing the indwelling PICC line and occlusive thrombus in the right axillary vein". Pharmacy consulted to initiate and manage IV heparin for RUE DVT.  Patient is a hard stick and PICC has since been removed  Of note, patient was previously on heparin this admission and was therapeutic at 2000 units/hr. If difficulty obtaining blood draws, low threshold to empirically decrease infusion to this rate  .bdbhep  Goal of Therapy:  Heparin level 0.3-0.7 units/ml Monitor platelets by anticoagulation protocol: Yes   Plan:  HL 0.15 @1832  on 8/17, no further interruptions were charted since 1300 per d/w CCU RN.   -- Bolus 3500 un x1; then increase heparin infusion rate to 2600 units/hr --Will check HL in 6hrs from rate change; then daily once consecutively therapeutic --Daily CBC per protocol while on IV heparin --Plan to transition to apixaban after conclusion of procedures (pending PEG tube placement)  Shanon Brow Zymeir Salminen 03/06/2022,7:18 PM

## 2022-03-06 NOTE — Progress Notes (Signed)
NAME:  Samuel Chavez, MRN:  [id number], DOB:  Feb 21, 1976, LOS: 52 ADMISSION DATE:  02/11/2022  History of Present Illness:  46 y.o male with significant PMH as below who presented to the ED with chief complaints of SOB and worsening bilateral lower extremities edema.   ED Course: In the emergency department, the temperature was 37.3C, the heart rate 99 beats/minute, the blood pressure 187/102 mm Hg, the respiratory rate 20 breaths/minute, and the oxygen saturation 89% on. He was noted to be drowsy but still protecting his airway and responding appropriately.   Pertinent  Medical History   Past Medical History:  Diagnosis Date   Diabetes mellitus without complication ([place])    Hypertension    Pancreatitis     Significant Hospital Events: Including procedures, antibiotic start and stop dates in addition to other pertinent events   7/25: Admitted to hospitalist service w/acute on chronic hypoxic hypercapnic resp. failure requiring BiPAP.Failed BiPAP and intubated. PCCM consulted 7/26: INTUBATED, difficlut to sedate, started [place] 7/27: severe hypoxia, PEEP increased to 15 but decreased back to 10 7/28: Persistent severe hypoxia, ventilator changes made 7/29: Persistent hypoxia, recruitment maneuvers instituted, ventilator changes made, empiric heparin as patient cannot be scanned query PE -02/17/22- patient is severely critically ill with bilateral multifocal pneumonia on sedation and paralysis with mechanical ventilation maximal settings.  He is at cusp of death, we met with daughter today and discussed severity of illness. Unable to perform CT due to severity of critical illness. Met with previous PCCM doc and discussed medical plan.  Patient had recruitment maneuvers last few days without improvement, on heparin gtt for possible PE.  02/18/22- patient continues to require maximal settings on ventilator despite good UOP.  He destaturated to <50% spO2 on MV today required BGV.    02/19/22- patient continues to require 100% FiO2 on maximal setting with high PEEP ladder for possible ARDS.  He was diuresed and on steroids with development of AKI.  His CXR had slight interval improvement on right. We discussed case with vascular surgery regarding possible empiric tPA for PE.  02/20/22- patient was unable to get CT today due to continued severe hypoxemia.  Daughter and other family members at bedside we reviewed case and medical plan.  There seems to be some confusion from family as they had asked me to wake patient up and take off ventilator even though I had explained multiple times that he is at very high risk for death.  They laughed at my comments and seemed to think it was not true. We may still be able to do bronchoscopy but its high risk.  We have reduced IV infusions by switching some medications to OGT route.  02/21/22- patient is weaned to 60%FiO2.  Plan for possible bronch if patient is tolerating.  Family at bedside.  If able to we will obtain more imaging and VQ scan to rule out PE.  02/22/22- Patient weaned to 50% on PRVC.  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 CBC stable , CMP with improved GFR. Monitoring Na, pharmacy and renal following for electrolytes/renal function.  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  Overall marked improvement on ventilator over past 48h. Met with daughter at bedside reviewed medical plan.  02/24/22-Vent requirements continue to slowly improve, currently 40% FiO2 and 14 PEEP.  AKI continues to slowly improve, remains hyperkalemic with potassium of 5.8.  Placed back on  Veltassa, Diurese x1.  Trach scheduled for tomorrow at 1:15 PM 02/25/22- Diurese with Lasix x1 dose. Trach scheduled for today. 02/26/22: Remains mechanically ventilated via newly placed tracheostomy will attempt to wean ventilator settings today.  Perform WUA  02/26/22: Pt remains mechanically  ventilated via tracheostomy vent settings: PEEP 14/FiO2 80% 02/27/22: Fevers, ID consulted. Repeating Tracheal aspirate and UA.  Zosyn started 02/28/22: Persistent fevers, Tracheal aspirate with Staph aureus, start Linezolid.  Worsening Creatinine, considering Lasix gtt given high vent requirements (100% FiO2, 14 peep).  D/c fentanyl gtt and decrease oral benzos/narcotics 03/01/22: worsening anuric renal failure. HD line placed and started on hemodialysis. 03/02/22: Vent support slowly improving. Plan for HD again today. 8/14-8/15: remains febrile, encephalopathic despite HD sessions 8/16 fever curve improved, clots noted in LUE  Consults:  PCCM Nephrology Vascular Surgery Palliative Care ENT Infectious Disease  Procedures:  7/25: Intubation 7/26: PICC triple-lumen, right basilic vein 3/90: Left radial arterial line  8/08: Size 8 proximal XLT Shiley inserted 8/12: Right IJ HD catheter placed  Significant Diagnostic Tests:  7/25  Chest Xray:Chest x-ray showed cardiomegaly and pulmonary vascular congestion without overt pulmonary edema 7/25 Echocardiogram: difficult study, LVEF was estimated at 55 to 60%, dilated LV moderate LVH, grade 1 DD, enlargement of the right ventricle, left atrial size moderately dilated, right atrial size moderately dilated this is consistent with restrictive physiology (echo reading not congruous with prior echoes from [facility]) 7/29: Venous US BLE>>IMPRESSION: No lower extremity DVT. 7/30 Limited echo: LVEF 40 to 45% decreased LV function, no wall motion abnormalities, moderate dilation of the LV concentric LVH, right ventricular size moderately enlarged, right atrial enlargement, this is more consistent with prior [facility] scans 8/4: CT Head>>No acute intracranial abnormality. Paranasal sinusitis. Bilateral complete opacification of the mastoid air cells and inner ears, as can be seen in the setting of otitis media. Correlate with  symptoms. 8/4: CT Chest/Abdomen/Pelvis>>Extensive ground-glass, alveolar and patchy dense infiltrates in both lungs suggesting multifocal pneumonia. Part of this finding may suggest underlying pulmonary edema. Small bilateral pleural effusions are seen. Cardiomegaly. There is ectasia of the main pulmonary artery suggesting pulmonary arterial hypertension. There is no evidence of intestinal obstruction or pneumoperitoneum. There is no hydronephrosis. Appendix is not dilated. UB diverticula are seen in the colon without signs of focal diverticulitis. Severe degenerative changes are noted at L4-L5 level with significant interval worsening. Findings may be due to severe disc degeneration. If there is clinical suspicion for discitis or osteomyelitis, follow-up MRI may be considered 8/4: Lung V/Q>>Pulmonary embolism absent. 8/16: RUE brachial and axillary vein DVT associated with PICC line  Micro Data:  7/25: SARS-CoV-2 PCR> negative 7/25: MRSA PCR>> NEG 7/27: Strep pneumoniae Ag>> negative 7/27: Legionella Ag>> negative 7/27: Mycoplasma>> <770 7/27: Sputum>> Strep pneumo,Haemophilus influenzae 7/30 Sputum cult >> negative 8/4: Tracheal aspirate>>negative 8/9: Tracheal aspirate>>MRSA 8/10: RVP>>negative 8/10: Right nasal Wound culture>>STAPHYLOCOCCUS AUREUS, PROVOTELLA 8/10: MRSA PCR>> Positive 8/11: Blood culture 1/4>>Staphylococcus epidermidis MecA/C (suspect contaminant)  Antimicrobials:  Cefepime 7/27 >>7/31 Ceftriaxone 7/31>>8/4 Vancomycin 7/27 >> 7/29, restarted 7/30 (resumed due to fever spike on Maxipime) ? Resistant Strep pneumo >> 7/31 Unasyn 8/14>> Linezolid 8/11>>  Interim History / Subjective:  No events, maybe slightly more awake?  Still not following commands or moving ext.  Objective   Blood pressure (!) 140/72, pulse (!) 101, temperature 100.1 F (37.8 C), temperature source Axillary, resp. rate (!) 30, height _0  (1.753 m), weight (!) 204 kg, SpO2 94 %. CVP:  [11  mmHg-48 mmHg] 48  mmHg  Vent Mode: PRVC FiO2 (%):  [40 %-50 %] 40 % Set Rate:  [30 bmp] 30 bmp Vt Set:  [560 mL] 560 mL PEEP:  [8 cmH20] 8 cmH20 Plateau Pressure:  [14 cmH20] 14 cmH20   Intake/Output Summary (Last 24 hours) at 03/06/2022 0819 Last data filed at 03/06/2022 0600 Gross per 24 hour  Intake 47.34 ml  Output 2015 ml  Net -1967.66 ml    Filed Weights   03/02/22 0155 03/05/22 0409 03/06/22 0500  Weight: (!) 197.4 kg (!) 205 kg (!) 204 kg    Examination: L gaze preference unchanged Grimace to pain but no motor response Ongoing marked edema Ext warm Lung/heart sounds distant secondary to body habitus  BMP pending CBC stable  Resolved Hospital Problem list   Community-acquired pneumonia: Streptococcus pneumoniae and Haemophilus influenzae ~ TREATED  Hypernatremia  Assessment & Plan:  Acute on chronic hypoxemic and hypercapneic respiratory failure now s/p tracheostomy  MRSA HCAP- on linezolid x 7 days  Provetella sinusitis- unasyn, duration TBD  Obesity cardiomyopathy, OHS/OSA  Acute renal failure with hyperkalemia now on iHD, still markedly volume up  DM2 with hyperglycemia- now on basal bolus  Ongoing severe toxic encephalopathy- MRI brain/C spine okay 8/15.  Still uremic.  EEG 8/16 neg.  Will give more time, I would say if not awake by 8/21 may be worthwhile to bring neuro on board.  PICC associated DVT- remove  - Check ammonia, TSH - iHD per nephrology.  Please push fluid removal.  I think his dry weight is probably 170kg (weight from June 2022) - Continue basal bolus as ordered - Linezolid x 7 days (MRSA HCAP) - Augmentin duration x 7 days (sinusitis) - Heparin gtt, transition to eliquis once PEG complete - Lung protective tidal volumes limiting driving pressures to < 15cm H2O as able; may see how he does with PS today - VAP prevention bundle - Post trach care per usual pathway - IR PEG consult - Family updated at bedside 03/05/22  Best Practice  (right click and "Reselect all SmartList Selections" daily)   Diet/type: tubefeeds DVT prophylaxis: systemic heparin GI prophylaxis: PPI Lines: HD catheter Foley:  Out Code Status:  full code Last date of multidisciplinary goals of care discussion [ongoing]  Long term prognosis guarded  31 min cc time Erskine Emery MD PCCM

## 2022-03-06 NOTE — Progress Notes (Addendum)
Uneventful day. Temp. Max 100.5. Interventional Radiologist refused to place peg due to elevation of temperature. Patient is extremely slow to start moving. Eyes open all day but only very slight head nods to questions. Remains in [place]. Had small bowel movement this morning. Urine output 50 ml every 4 hours. Dialysis orders for tomorrow. 1600 No sliding scale or additional  Insulin given.

## 2022-03-06 NOTE — Consult Note (Signed)
PHARMACY CONSULT NOTE  Pharmacy Consult for Electrolyte Monitoring and Replacement   Recent Labs: Potassium (mmol/L)  Date Value  03/06/2022 5.2 (H)   Magnesium (mg/dL)  Date Value  03/04/2022 2.5 (H)   Calcium (mg/dL)  Date Value  03/06/2022 7.5 (L)   Albumin (g/dL)  Date Value  03/01/2022 1.9 (L)   Phosphorus (mg/dL)  Date Value  03/06/2022 10.3 (H)   Sodium (mmol/L)  Date Value  03/06/2022 134 (L)   Assessment: Patient is a 46 y/o M with medical history including DM, HTN, pancreatitis, tobacco use disorder, systolic CHF, OSA, DM c/b diabetic neuropathy, lumbar radiculopathy, morbid obesity who is admitted with acute respiratory failure in setting of acute CHF and hypertensive urgency. Patient is currently intubated, sedated, and on mechanical ventilation in the ICU. Pharmacy consulted to assist with electrolyte monitoring and replacement as indicated.  Nutrition: Tube feeds at 70 mL/hr (~ 1.68 L/day) + free water 30 mL q4h (180 mL/day)   Nephrology consulted for renal dysfunction and to assist with hypernatremia management. HD started 8/12  Goal of Therapy:  Electrolytes within normal limits  Plan:  --Hypernatremia resolved, fluid management per PCCM and nephrology --Mild hyperkalemia, management per PCCM and nephrology --Magnesium & phosphorous elevated in setting of renal dysfunction --Follow-up electrolytes with AM labs tomorrow  Benita Gutter  03/06/2022 8:49 AM

## 2022-03-06 NOTE — Progress Notes (Signed)
IR received request for image guided percutaneous gastrostomy tube placement. Chart review and recent CT imaging reviewed with my attending, Dr. Denna Haggard. Unfortunately Blood Cx were + 6 days ago ID is following and patient continues to have fevers with Tmax 100.5 today. We would need patient to be afebrile x 48 hours prior to G-tube placement and we will need a new CT abdomen study performed with stomach insufflation at time of CT to evaluate if anatomy is amendable to percutaneous G-tube approach.   IR approval pending repeat CT to evaluate anatomy- CT has been ordered.   If the patient's anatomy is approved for percutaneous G-tube approach, the patient will need to be afebrile x 48 hours. Ordering provider and Floor RN aware of plan.   Hedy Jacob, PA-C 03/06/2022, 2:24 PM

## 2022-03-06 NOTE — Progress Notes (Signed)
eLink Physician-Brief Progress Note Patient Name: Samuel Chavez DOB: 1975-10-12 MRN: [id number]   Date of Service  03/06/2022  HPI/Events of Note  Review of Vascular Ultrasound of bilateral upper extremities: 1. Non-occlusive/nearly occlusive thrombus in one of paired right upper arm brachial veins containing the indwelling PICC line and occlusive thrombus in the right axillary vein. The visualized segment of the right subclavian vein is patent. 2. No evidence of left upper extremity DVT.  eICU Interventions  Plan: Heparin IV infusion per pharmacy consult. Remove R arm PICC if possible.      Intervention Category Major Interventions: Other:  Norwin Aleman Cornelia Copa 03/06/2022, 2:18 AM

## 2022-03-06 NOTE — Progress Notes (Signed)
ANTICOAGULATION CONSULT NOTE - Initial Consult  Pharmacy Consult for Heparin  Indication: DVT  Allergies  Allergen Reactions   Hydrocodone Swelling    Patient Measurements: Height: 5\' 9"  (175.3 cm) Weight: (!) 205 kg (451 lb 15.1 oz) IBW/kg (Calculated) : 70.7 Heparin Dosing Weight: 123.4 kg   Vital Signs: Temp: 100.6 F (38.1 C) (08/17 0000) Temp Source: Axillary (08/17 0000) BP: 131/80 (08/17 0200) Pulse Rate: 100 (08/17 0101)  Labs: Recent Labs    03/03/22 0511 03/03/22 1536 03/04/22 0440 03/05/22 0419  HGB 8.1*  --  7.9* 8.1*  HCT 28.6*  --  27.0* 27.4*  PLT 198  --  195 229  CREATININE 4.78* 5.60* 4.23* 5.73*    Estimated Creatinine Clearance: 28.9 mL/min (A) (by C-G formula based on SCr of 5.73 mg/dL (H)).   Medical History: Past Medical History:  Diagnosis Date   Diabetes mellitus without complication ([place])    Hypertension    Pancreatitis     Medications:  Medications Prior to Admission  Medication Sig Dispense Refill Last Dose   losartan (COZAAR) 100 MG tablet Take 100 mg by mouth daily.   Past Week   OZEMPIC, 0.25 OR 0.5 MG/DOSE, 2 MG/3ML SOPN SMARTSIG:0.25 Milligram(s) SUB-Q Once a Week   Past Week   torsemide (DEMADEX) 20 MG tablet Take 20 mg by mouth daily.   Past Week   Aspirin-Salicylamide-Caffeine (BC HEADACHE POWDER PO) Take 1 packet by mouth as needed (for pain).      cephALEXin (KEFLEX) 500 MG capsule Take 1 capsule (500 mg total) by mouth 4 (four) times daily. (Patient not taking: Reported on 02/11/2022) 20 capsule 0 Not Taking   cyclobenzaprine (FLEXERIL) 10 MG tablet Take 0.5-1 tablets (5-10 mg total) by mouth 2 (two) times daily as needed for muscle spasms. (Patient not taking: Reported on 02/11/2022) 20 tablet 0 Not Taking   gabapentin (NEURONTIN) 600 MG tablet Take 600 mg by mouth at bedtime. (Patient not taking: Reported on 02/11/2022)   Not Taking   LABETALOL HCL PO Take by mouth. (Patient not taking: Reported on 02/11/2022)   Not Taking    meloxicam (MOBIC) 15 MG tablet Take 1 tablet (15 mg total) by mouth daily. Take 1 daily with food. (Patient not taking: Reported on 02/11/2022) 10 tablet 0 Not Taking   metFORMIN (GLUCOPHAGE) 500 MG tablet Take 500 mg by mouth 2 (two) times daily with a meal. (Patient not taking: Reported on 02/11/2022)   Not Taking   METFORMIN HCL ER, MOD, PO Take by mouth. (Patient not taking: Reported on 02/11/2022)   Not Taking   METOPROLOL SUCCINATE ER PO Take by mouth. (Patient not taking: Reported on 02/11/2022)   Not Taking   oxyCODONE-acetaminophen (PERCOCET) 10-325 MG tablet Take 1 tablet by mouth See admin instructions. Take 1 tablet by mouth 4-5 times a day as needed for pain (Patient not taking: Reported on 02/11/2022)   Not Taking   oxyCODONE-acetaminophen (PERCOCET/ROXICET) 5-325 MG tablet Take 1 tablet by mouth every 8 (eight) hours as needed for severe pain. (Patient not taking: Reported on 02/11/2022) 4 tablet 0 Not Taking   predniSONE (DELTASONE) 20 MG tablet Take 2 tablets (40 mg total) by mouth daily. (Patient not taking: Reported on 02/11/2022) 6 tablet 0 Not Taking    Assessment: Pharmacy consulted to dose heparin in this 46 year old male with newly diagnosed DVT.  Pt was on heparin 5000 units SQ Q8H , last dose on 8/16 @ 2137.  CrCl = 28.9 ml/min  Goal of Therapy:  Heparin level 0.3-0.7 units/ml Monitor platelets by anticoagulation protocol: Yes   Plan:  Pt received heparin 5000 units on 8/16 @ 2137; will not bolus this pt.  Start heparin infusion at 2200 units/hr Check anti-Xa level in 8 hours and daily while on heparin Continue to monitor H&H and platelets  Maurya Nethery D 03/06/2022,2:43 AM

## 2022-03-07 DIAGNOSIS — J15212 Pneumonia due to Methicillin resistant Staphylococcus aureus: Secondary | ICD-10-CM | POA: Diagnosis not present

## 2022-03-07 DIAGNOSIS — J9601 Acute respiratory failure with hypoxia: Secondary | ICD-10-CM | POA: Diagnosis not present

## 2022-03-07 DIAGNOSIS — J9602 Acute respiratory failure with hypercapnia: Secondary | ICD-10-CM | POA: Diagnosis not present

## 2022-03-07 DIAGNOSIS — F17210 Nicotine dependence, cigarettes, uncomplicated: Secondary | ICD-10-CM | POA: Diagnosis not present

## 2022-03-07 LAB — HEPARIN LEVEL (UNFRACTIONATED)
Heparin Unfractionated: 0.22 IU/mL — ABNORMAL LOW (ref 0.30–0.70)
Heparin Unfractionated: 0.5 IU/mL (ref 0.30–0.70)
Heparin Unfractionated: 0.4 IU/mL (ref 0.30–0.70)

## 2022-03-07 LAB — BASIC METABOLIC PANEL
Anion gap: 13 (ref 5–15)
BUN: 116 mg/dL — ABNORMAL HIGH (ref 6–20)
CO2: 22 mmol/L (ref 22–32)
Calcium: 7.5 mg/dL — ABNORMAL LOW (ref 8.9–10.3)
Chloride: 99 mmol/L (ref 98–111)
Creatinine, Ser: 6.08 mg/dL — ABNORMAL HIGH (ref 0.61–1.24)
GFR, Estimated: 11 mL/min — ABNORMAL LOW (ref >60)
Glucose, Bld: 142 mg/dL — ABNORMAL HIGH (ref 70–99)
Potassium: 6.1 mmol/L — ABNORMAL HIGH (ref 3.5–5.1)
Sodium: 134 mmol/L — ABNORMAL LOW (ref 135–145)

## 2022-03-07 LAB — C-REACTIVE PROTEIN: CRP: 6.4 mg/dL — ABNORMAL HIGH (ref <1.0)

## 2022-03-07 LAB — CBC WITH DIFFERENTIAL/PLATELET
Abs Immature Granulocytes: 0.09 10*3/uL — ABNORMAL HIGH (ref 0.00–0.07)
Basophils Absolute: 0 10*3/uL (ref 0.0–0.1)
Basophils Relative: 0 %
Eosinophils Absolute: 0.3 10*3/uL (ref 0.0–0.5)
Eosinophils Relative: 4 %
HCT: 28.2 % — ABNORMAL LOW (ref 39.0–52.0)
Hemoglobin: 8.7 g/dL — ABNORMAL LOW (ref 13.0–17.0)
Immature Granulocytes: 1 %
Lymphocytes Relative: 11 %
Lymphs Abs: 0.9 10*3/uL (ref 0.7–4.0)
MCH: 26.5 pg (ref 26.0–34.0)
MCHC: 30.9 g/dL (ref 30.0–36.0)
MCV: 86 fL (ref 80.0–100.0)
Monocytes Absolute: 0.8 10*3/uL (ref 0.1–1.0)
Monocytes Relative: 9 %
Neutro Abs: 6.5 10*3/uL (ref 1.7–7.7)
Neutrophils Relative %: 75 %
Platelets: 211 10*3/uL (ref 150–400)
RBC: 3.28 MIL/uL — ABNORMAL LOW (ref 4.22–5.81)
RDW: 15.7 % — ABNORMAL HIGH (ref 11.5–15.5)
WBC: 8.6 10*3/uL (ref 4.0–10.5)
nRBC: 0 % (ref 0.0–0.2)

## 2022-03-07 LAB — GLUCOSE, CAPILLARY
Glucose-Capillary: 123 mg/dL — ABNORMAL HIGH (ref 70–99)
Glucose-Capillary: 131 mg/dL — ABNORMAL HIGH (ref 70–99)
Glucose-Capillary: 136 mg/dL — ABNORMAL HIGH (ref 70–99)
Glucose-Capillary: 137 mg/dL — ABNORMAL HIGH (ref 70–99)
Glucose-Capillary: 137 mg/dL — ABNORMAL HIGH (ref 70–99)
Glucose-Capillary: 144 mg/dL — ABNORMAL HIGH (ref 70–99)
Glucose-Capillary: 153 mg/dL — ABNORMAL HIGH (ref 70–99)

## 2022-03-07 LAB — PHOSPHORUS: Phosphorus: 11.6 mg/dL — ABNORMAL HIGH (ref 2.5–4.6)

## 2022-03-07 MED ORDER — HEPARIN SODIUM (PORCINE) 1000 UNIT/ML IJ SOLN
INTRAMUSCULAR | Status: AC
  Filled 2022-03-07: qty 10

## 2022-03-07 MED ORDER — FENTANYL CITRATE PF 50 MCG/ML IJ SOSY
25.0000 ug | PREFILLED_SYRINGE | INTRAMUSCULAR | Status: DC | PRN
  Administered 2022-03-10: 50 ug via INTRAVENOUS
  Administered 2022-03-16 – 2022-03-17 (×2): 100 ug via INTRAVENOUS
  Administered 2022-03-17 (×3): 50 ug via INTRAVENOUS
  Administered 2022-03-18 – 2022-03-19 (×2): 100 ug via INTRAVENOUS
  Administered 2022-03-19 – 2022-03-20 (×2): 50 ug via INTRAVENOUS
  Administered 2022-03-20 (×3): 100 ug via INTRAVENOUS
  Administered 2022-03-20 – 2022-03-21 (×3): 50 ug via INTRAVENOUS
  Administered 2022-03-21 (×3): 100 ug via INTRAVENOUS
  Administered 2022-03-22: 50 ug via INTRAVENOUS
  Administered 2022-03-22: 100 ug via INTRAVENOUS
  Administered 2022-03-22: 50 ug via INTRAVENOUS
  Administered 2022-03-22: 100 ug via INTRAVENOUS
  Administered 2022-03-22: 50 ug via INTRAVENOUS
  Administered 2022-03-22: 100 ug via INTRAVENOUS
  Administered 2022-03-23 (×3): 50 ug via INTRAVENOUS
  Administered 2022-03-23: 25 ug via INTRAVENOUS
  Administered 2022-03-23: 50 ug via INTRAVENOUS
  Filled 2022-03-07 (×5): qty 2
  Filled 2022-03-07: qty 1
  Filled 2022-03-07 (×2): qty 2
  Filled 2022-03-07 (×2): qty 1
  Filled 2022-03-07 (×4): qty 2
  Filled 2022-03-07 (×4): qty 1
  Filled 2022-03-07: qty 2
  Filled 2022-03-07 (×6): qty 1
  Filled 2022-03-07: qty 2
  Filled 2022-03-07: qty 1
  Filled 2022-03-07: qty 2
  Filled 2022-03-07: qty 1
  Filled 2022-03-07: qty 2
  Filled 2022-03-07 (×2): qty 1

## 2022-03-07 MED ORDER — CARVEDILOL 3.125 MG PO TABS
3.1250 mg | ORAL_TABLET | Freq: Two times a day (BID) | ORAL | Status: DC
  Administered 2022-03-07 – 2022-03-12 (×10): 3.125 mg
  Filled 2022-03-07 (×10): qty 1

## 2022-03-07 MED ORDER — ORAL CARE MOUTH RINSE
15.0000 mL | OROMUCOSAL | Status: DC
  Administered 2022-03-08 – 2022-03-20 (×140): 15 mL via OROMUCOSAL

## 2022-03-07 MED ORDER — HEPARIN BOLUS VIA INFUSION
1850.0000 [IU] | Freq: Once | INTRAVENOUS | Status: AC
  Administered 2022-03-07: 1850 [IU] via INTRAVENOUS
  Filled 2022-03-07: qty 1850

## 2022-03-07 NOTE — Progress Notes (Signed)
PT did 3.5 hrs of dialysis with no issue. PT tolerated tx well.   UF = 3000 ml  Vs 139/72 Hr 91 Rr 28 O2sat 92  CVC dressing changed; Heplock and capped.  Report given to floor R.N.

## 2022-03-07 NOTE — Progress Notes (Addendum)
Quiet day. Dialysis for 4 hours this am. Then cleaned of BM. All foams on sacrum and left buttock changed. More alert this PM after nap post dialysis. Oxygen saturations 86%-87% FIO2 changed to 45%. Copious amount of  tracheal secretions. Trach care done. Moving head more this pm.

## 2022-03-07 NOTE — Progress Notes (Signed)
ANTICOAGULATION CONSULT NOTE  Pharmacy Consult for IV Heparin  Indication: RUE DVT associated with PICC  Patient Measurements: Height: 5\' 9"  (175.3 cm) Weight:  (bed scale broken) IBW/kg (Calculated) : 70.7 Heparin Dosing Weight: 123.4 kg   Labs: Recent Labs    03/05/22 0419 03/06/22 0740 03/06/22 1342 03/07/22 0417 03/07/22 1247 03/07/22 2118  HGB 8.1* 8.3*  --  8.7*  --   --   HCT 27.4* 27.3*  --  28.2*  --   --   PLT 229 249  --  211  --   --   HEPARINUNFRC  --   --    < > 0.22* 0.50 0.40  CREATININE 5.73* 5.11*  --  6.08*  --   --    < > = values in this interval not displayed.   Estimated Creatinine Clearance: 27.2 mL/min (A) (by C-G formula based on SCr of 6.08 mg/dL (H)).  Medical History: Past Medical History:  Diagnosis Date   Diabetes mellitus without complication ([place])    Hypertension    Pancreatitis    Assessment: Patient is a 46 y/o M with medical history including DM, HTN, pancreatitis, tobacco use disorder, systolic CHF, OSA, DM c/b diabetic neuropathy, lumbar radiculopathy, morbid obesity who is admitted with acute respiratory failure in setting of acute CHF, hypertensive urgency, and pneumonia. Patient with prolonged admission on ventilator complicated by acute renal failure. Upper extremity doppler noted "non-occlusive/nearly occlusive thrombus in one of paired right upper arm brachial veins containing the indwelling PICC line and occlusive thrombus in the right axillary vein". Pharmacy consulted to initiate and manage IV heparin for RUE DVT.  Patient is a hard stick and PICC has since been removed  Goal of Therapy:  Heparin level 0.3-0.7 units/ml Monitor platelets by anticoagulation protocol: Yes   Plan:  --Heparin level is therapeutic x 2 --Continue heparin infusion at 2800 units/hr --Re-check confirmatory HL with daily labs --Daily CBC per protocol while on IV heparin --Plan to transition to apixaban after conclusion of procedures (pending PEG  tube placement)  Darrick Penna 03/07/2022,9:50 PM

## 2022-03-07 NOTE — Progress Notes (Signed)
Date of Admission:  02/11/2022     ID: Samuel Chavez is a 46 y.o. male Principal Problem:   Acute respiratory failure with hypoxia and hypercarbia (HCC) Active Problems:   Acute on chronic systolic CHF (congestive heart failure) (HCC)   Hypertensive urgency   Type 2 diabetes mellitus with peripheral neuropathy (HCC)   Acute heart failure with preserved ejection fraction (HCC)   Renal failure (ARF), acute on chronic (HCC)   Hospital-acquired pneumonia   ARDS (adult respiratory distress syndrome) (HCC)    Subjective: Has tracheotomy, on the vent   Medications:   amoxicillin-clavulanate  1 tablet Per Tube BID   Chlorhexidine Gluconate Cloth  6 each Topical Q0600   famotidine  20 mg Per Tube QHS   feeding supplement (PROSource TF)  90 mL Per Tube TID   fiber supplement (BANATROL TF)  60 mL Per Tube BID   free water  30 mL Per Tube Q4H   heparin sodium (porcine)       insulin aspart  0-20 Units Subcutaneous Q4H   insulin aspart  4 Units Subcutaneous Q4H   insulin detemir  22 Units Subcutaneous BID   insulin starter kit- pen needles  1 kit Other Once   ipratropium-albuterol  3 mL Nebulization Q6H   linezolid  600 mg Per Tube Q12H   living well with diabetes book   Does not apply Once   multivitamin  1 tablet Per Tube QHS   mupirocin ointment   Nasal BID   mouth rinse  15 mL Mouth Rinse Q2H   sodium chloride flush  10-40 mL Intracatheter Q12H    Objective: Vital signs in last 24 hours: Temp:  [100.1 F (37.8 C)-100.6 F (38.1 C)] 100.1 F (37.8 C) (08/18 0830) Pulse Rate:  [95-106] 100 (08/18 1045) Resp:  [0-38] 29 (08/18 1045) BP: (130-160)/(60-80) 131/80 (08/18 1045) SpO2:  [89 %-100 %] 93 % (08/18 0816) FiO2 (%):  [30 %-45 %] 40 % (08/18 0728)    PHYSICAL EXAM:  General: tracheostomy Opens eys spontaneously but does not track Lungs: b/l airentry Heart: Tachycardia Abdomen: Soft, non-tender,not distended. Bowel sounds normal. No masses Extremities: severe  edema of arms and legs Skin: No rashes or lesions. Or bruising Lymph: Cervical, supraclavicular normal. Neurologic: cannot be assessed Lab Results Recent Labs    03/06/22 0740 03/07/22 0417  WBC 9.8 8.6  HGB 8.3* 8.7*  HCT 27.3* 28.2*  NA 134* 134*  K 5.2* 6.1*  CL 98 99  CO2 24 22  BUN 97* 116*  CREATININE 5.11* 6.08*    C-Reactive Protein Recent Labs    03/05/22 0419 03/06/22 0740  CRP 13.3* 8.6*    Microbiology: Tracheal aspirate - culture-MRSA  Studies/Results: EEG adult  Result Date: 03/05/2022 Derek Jack, MD     03/05/2022  8:06 PM Routine EEG Report STANLEE ROEHRIG is a 46 y.o. male with a history of encephalopathy who is undergoing an EEG to evaluate for seizures. Report: This EEG was acquired with electrodes placed according to the International 10-20 electrode system (including Fp1, Fp2, F3, F4, C3, C4, P3, P4, O1, O2, T3, T4, T5, T6, A1, A2, Fz, Cz, Pz). The following electrodes were missing or displaced: none. The occipital dominant rhythm was 5 Hz. This activity is reactive to stimulation. Drowsiness was manifested by background fragmentation; deeper stages of sleep were identified by K complexes and sleep spindles. There was no focal slowing. There were no interictal epileptiform discharges. There were no electrographic seizures  identified. Photic stimulation and hyperventilation were not performed. Impression and clinical correlation: This EEG was obtained while awake and asleep and is abnormal due to mild diffuse slowing indicative of global cerebral dysfunction. Epileptiform abnormalities were not seen during this recording. Su Monks, MD [facility] [phone] If 7pm- 7am, please page neurology on call as listed in [place].   US Venous Img Upper Bilat (DVT)  Result Date: 03/05/2022 CLINICAL DATA:  Bilateral upper extremity edema and indwelling right upper extremity PICC line. EXAM: BILATERAL UPPER EXTREMITY VENOUS DOPPLER ULTRASOUND  TECHNIQUE: Gray-scale sonography with graded compression, as well as color Doppler and duplex ultrasound were performed to evaluate the bilateral upper extremity deep venous systems from the level of the subclavian vein and including the jugular, axillary, basilic, radial, ulnar and upper cephalic vein. Spectral Doppler was utilized to evaluate flow at rest and with distal augmentation maneuvers. COMPARISON:  None Available. FINDINGS: RIGHT UPPER EXTREMITY Internal Jugular Vein: No evidence of thrombus. Normal compressibility, respiratory phasicity and response to augmentation. Subclavian Vein: No evidence of thrombus. Normal compressibility, respiratory phasicity and response to augmentation. Axillary Vein: Occlusive thrombus identified. Cephalic Vein: No evidence of thrombus. Normal compressibility, respiratory phasicity and response to augmentation. Basilic Vein: No evidence of thrombus. Normal compressibility, respiratory phasicity and response to augmentation. Brachial Veins: Paired brachial veins present. The brachial vein containing the indwelling PICC line demonstrates nonocclusive thrombus which is nearly occlusive and demonstrates very little flow surrounding the indwelling PICC line. The other paired brachial vein is normally patent. Radial Veins: No evidence of thrombus. Normal compressibility, respiratory phasicity and response to augmentation. Ulnar Veins: No evidence of thrombus. Normal compressibility, respiratory phasicity and response to augmentation. Venous Reflux:  None. Other Findings: No evidence of superficial thrombophlebitis or abnormal fluid collection. LEFT UPPER EXTREMITY Internal Jugular Vein: No evidence of thrombus. Normal compressibility, respiratory phasicity and response to augmentation. Subclavian Vein: No evidence of thrombus. Normal compressibility, respiratory phasicity and response to augmentation. Axillary Vein: No evidence of thrombus. Normal compressibility, respiratory  phasicity and response to augmentation. Cephalic Vein: No evidence of thrombus. Normal compressibility, respiratory phasicity and response to augmentation. Basilic Vein: No evidence of thrombus. Normal compressibility, respiratory phasicity and response to augmentation. Brachial Veins: No evidence of thrombus. Normal compressibility, respiratory phasicity and response to augmentation. Radial Veins: No evidence of thrombus. Normal compressibility, respiratory phasicity and response to augmentation. Ulnar Veins: No evidence of thrombus. Normal compressibility, respiratory phasicity and response to augmentation. Venous Reflux:  None. Other Findings: No evidence of superficial thrombophlebitis or abnormal fluid collection. IMPRESSION: 1. Non-occlusive/nearly occlusive thrombus in one of paired right upper arm brachial veins containing the indwelling PICC line and occlusive thrombus in the right axillary vein. The visualized segment of the right subclavian vein is patent. 2. No evidence of left upper extremity DVT. Electronically Signed   By: Aletta Edouard M.D.   On: 03/05/2022 14:22     Assessment/Plan: Acute  respiratory failure with hypercapnia, and hypoxia- intubated on admisison in the ED and now has tracheostomy Contributing factors, OSA, COPD, CHF, obesity hypoventilation syndrome MRI no acute finding   Fever - low grade now - post tracheostomy-  Leucocytosis has resolved D.D Pneumonia due to MRSA Tongue injury VS   sinusitis PICC in place- site is clean RESp viral PCR Neg On linezolid for MRSA pneumonia- would treat for 10 -14 days  And unasyn for prevotella sinusitis- may do for 7-10 days   Recommend CT chest/ abd if fever persist    Pneumonia -  Strep pneumo/hinfluenza in tracheal aspirate has been adequately treated.but now has MRSA- on linezolid If fever persist CT chest to look for empyema   Anemia ? __AKI- started dialysis Hypernatremia- resolved Hyperkalemia   Anasarca    _Chronic systolic heart failure- cardiology following  Discussed the management with care team ID will follow peripherally this weekend - call if needed

## 2022-03-07 NOTE — TOC Progression Note (Signed)
Transition of Care [facility]) - Progression Note    Patient Details  Name: Samuel Chavez MRN: [id number] Date of Birth: [birth year]  [facility]) CM/SW Contact  Shelbie Hutching, RN Phone Number: 03/07/2022, 12:09 PM  Clinical Narrative:    TOC continues to follow.  Patient remains on the ventilator in the ICU, he has been put on dialysis MWF.  Patient is not on sedation and not following commands, he will open his eyes and track.     Expected Discharge Plan:  (TBD) Barriers to Discharge: Continued Medical Work up  Expected Discharge Plan and Services Expected Discharge Plan:  (TBD)   Discharge Planning Services: CM Consult   Living arrangements for the past 2 months: Single Family Home                                       Social Determinants of Health (SDOH) Interventions    Readmission Risk Interventions     No data to display

## 2022-03-07 NOTE — Progress Notes (Signed)
Contacted primary RN for patient stability to travel to [place] for scan to assess if pt is a candidate for G-tube placement. RN states that the CT and G-tube order to be canceled for now per Dr. Mortimer Fries.   Dr. Denna Haggard, IR, made aware.     Narda Rutherford, AGNP-BC 03/07/2022, 2:11 PM

## 2022-03-07 NOTE — Progress Notes (Signed)
ANTICOAGULATION CONSULT NOTE  Pharmacy Consult for IV Heparin  Indication: RUE DVT associated with PICC  Patient Measurements: Height: 5\' 9"  (175.3 cm) Weight:  (bed scale broken) IBW/kg (Calculated) : 70.7 Heparin Dosing Weight: 123.4 kg   Labs: Recent Labs    03/05/22 0419 03/06/22 0740 03/06/22 1342 03/06/22 1832 03/07/22 0417 03/07/22 1247  HGB 8.1* 8.3*  --   --  8.7*  --   HCT 27.4* 27.3*  --   --  28.2*  --   PLT 229 249  --   --  211  --   HEPARINUNFRC  --   --    < > 0.15* 0.22* 0.50  CREATININE 5.73* 5.11*  --   --  6.08*  --    < > = values in this interval not displayed.   Estimated Creatinine Clearance: 27.2 mL/min (A) (by C-G formula based on SCr of 6.08 mg/dL (H)).  Medical History: Past Medical History:  Diagnosis Date   Diabetes mellitus without complication ([place])    Hypertension    Pancreatitis    Assessment: Patient is a 46 y/o M with medical history including DM, HTN, pancreatitis, tobacco use disorder, systolic CHF, OSA, DM c/b diabetic neuropathy, lumbar radiculopathy, morbid obesity who is admitted with acute respiratory failure in setting of acute CHF, hypertensive urgency, and pneumonia. Patient with prolonged admission on ventilator complicated by acute renal failure. Upper extremity doppler noted "non-occlusive/nearly occlusive thrombus in one of paired right upper arm brachial veins containing the indwelling PICC line and occlusive thrombus in the right axillary vein". Pharmacy consulted to initiate and manage IV heparin for RUE DVT.  Patient is a hard stick and PICC has since been removed  Goal of Therapy:  Heparin level 0.3-0.7 units/ml Monitor platelets by anticoagulation protocol: Yes   Plan:  --Heparin level is therapeutic x 1 --Continue heparin infusion at 2800 units/hr --Re-check confirmatory HL in 8 hours --Daily CBC per protocol while on IV heparin --Plan to transition to apixaban after conclusion of procedures (pending PEG tube  placement)  Benita Gutter 03/07/2022,1:48 PM

## 2022-03-07 NOTE — Progress Notes (Signed)
NAME:  Samuel Chavez, MRN:  [id number], DOB:  [birth year], LOS: 66 ADMISSION DATE:  02/11/2022  History of Present Illness:  46 y.o male with significant PMH as below who presented to the ED with chief complaints of SOB and worsening bilateral lower extremities edema.   ED Course: In the emergency department, the temperature was 37.3C, the heart rate 99 beats/minute, the blood pressure 187/102 mm Hg, the respiratory rate 20 breaths/minute, and the oxygen saturation 89% on. He was noted to be drowsy but still protecting his airway and responding appropriately.   Pertinent  Medical History   Past Medical History:  Diagnosis Date   Diabetes mellitus without complication ([place])    Hypertension    Pancreatitis     Significant Hospital Events: Including procedures, antibiotic start and stop dates in addition to other pertinent events   7/25: Admitted to hospitalist service w/acute on chronic hypoxic hypercapnic resp. failure requiring BiPAP.Failed BiPAP and intubated. PCCM consulted 7/26: INTUBATED, difficlut to sedate, started [place] 7/27: severe hypoxia, PEEP increased to 15 but decreased back to 10 7/28: Persistent severe hypoxia, ventilator changes made 7/29: Persistent hypoxia, recruitment maneuvers instituted, ventilator changes made, empiric heparin as patient cannot be scanned query PE -02/17/22- patient is severely critically ill with bilateral multifocal pneumonia on sedation and paralysis with mechanical ventilation maximal settings.  He is at cusp of death, we met with daughter today and discussed severity of illness. Unable to perform CT due to severity of critical illness. Met with previous PCCM doc and discussed medical plan.  Patient had recruitment maneuvers last few days without improvement, on heparin gtt for possible PE.  02/18/22- patient continues to require maximal settings on ventilator despite good UOP.  He destaturated to <50% spO2 on MV today required BGV.    02/19/22- patient continues to require 100% FiO2 on maximal setting with high PEEP ladder for possible ARDS.  He was diuresed and on steroids with development of AKI.  His CXR had slight interval improvement on right. We discussed case with vascular surgery regarding possible empiric tPA for PE.  02/20/22- patient was unable to get CT today due to continued severe hypoxemia.  Daughter and other family members at bedside we reviewed case and medical plan.  There seems to be some confusion from family as they had asked me to wake patient up and take off ventilator even though I had explained multiple times that he is at very high risk for death.  They laughed at my comments and seemed to think it was not true. We may still be able to do bronchoscopy but its high risk.  We have reduced IV infusions by switching some medications to OGT route.  02/21/22- patient is weaned to 60%FiO2.  Plan for possible bronch if patient is tolerating.  Family at bedside.  If able to we will obtain more imaging and VQ scan to rule out PE.  02/22/22- Patient weaned to 50% on PRVC.  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 CBC stable , CMP with improved GFR. Monitoring Na, pharmacy and renal following for electrolytes/renal function.  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  Overall marked improvement on ventilator over past 48h. Met with daughter at bedside reviewed medical plan.  02/24/22-Vent requirements continue to slowly improve, currently 40% FiO2 and 14 PEEP.  AKI continues to slowly improve, remains hyperkalemic with potassium of 5.8.  Placed back on  Veltassa, Diurese x1.  Trach scheduled for tomorrow at 1:15 PM 02/25/22- Diurese with Lasix x1 dose. Trach scheduled for today. 02/26/22: Remains mechanically ventilated via newly placed tracheostomy will attempt to wean ventilator settings today.  Perform WUA  02/26/22: Pt remains mechanically  ventilated via tracheostomy vent settings: PEEP 14/FiO2 80% 02/27/22: Fevers, ID consulted. Repeating Tracheal aspirate and UA.  Zosyn started 02/28/22: Persistent fevers, Tracheal aspirate with Staph aureus, start Linezolid.  Worsening Creatinine, considering Lasix gtt given high vent requirements (100% FiO2, 14 peep).  D/c fentanyl gtt and decrease oral benzos/narcotics 03/01/22: worsening anuric renal failure. HD line placed and started on hemodialysis. 03/02/22: Vent support slowly improving. Plan for HD again today. 8/14-8/15: remains febrile, encephalopathic despite HD sessions 8/16 fever curve improved, clots noted in LUE 8/18: continued low grade fever (t max 100.6).  Receiving HD.  Slight improvement in encephalopathy  Consults:  PCCM Nephrology Vascular Surgery Palliative Care ENT Infectious Disease  Procedures:  7/25: Intubation 7/26: PICC triple-lumen, right basilic vein 6/59: Left radial arterial line  8/08: Size 8 proximal XLT Shiley inserted 8/12: Right IJ HD catheter placed  Significant Diagnostic Tests:  7/25  Chest Xray:Chest x-ray showed cardiomegaly and pulmonary vascular congestion without overt pulmonary edema 7/25 Echocardiogram: difficult study, LVEF was estimated at 55 to 60%, dilated LV moderate LVH, grade 1 DD, enlargement of the right ventricle, left atrial size moderately dilated, right atrial size moderately dilated this is consistent with restrictive physiology (echo reading not congruous with prior echoes from [facility]) 7/29: Venous US BLE>>IMPRESSION: No lower extremity DVT. 7/30 Limited echo: LVEF 40 to 45% decreased LV function, no wall motion abnormalities, moderate dilation of the LV concentric LVH, right ventricular size moderately enlarged, right atrial enlargement, this is more consistent with prior [facility] scans 8/4: CT Head>>No acute intracranial abnormality. Paranasal sinusitis. Bilateral complete opacification of the mastoid  air cells and inner ears, as can be seen in the setting of otitis media. Correlate with symptoms. 8/4: CT Chest/Abdomen/Pelvis>>Extensive ground-glass, alveolar and patchy dense infiltrates in both lungs suggesting multifocal pneumonia. Part of this finding may suggest underlying pulmonary edema. Small bilateral pleural effusions are seen. Cardiomegaly. There is ectasia of the main pulmonary artery suggesting pulmonary arterial hypertension. There is no evidence of intestinal obstruction or pneumoperitoneum. There is no hydronephrosis. Appendix is not dilated. UB diverticula are seen in the colon without signs of focal diverticulitis. Severe degenerative changes are noted at L4-L5 level with significant interval worsening. Findings may be due to severe disc degeneration. If there is clinical suspicion for discitis or osteomyelitis, follow-up MRI may be considered 8/4: Lung V/Q>>Pulmonary embolism absent. 8/16: RUE brachial and axillary vein DVT associated with PICC line  Micro Data:  7/25: SARS-CoV-2 PCR> negative 7/25: MRSA PCR>> NEG 7/27: Strep pneumoniae Ag>> negative 7/27: Legionella Ag>> negative 7/27: Mycoplasma>> <770 7/27: Sputum>> Strep pneumo,Haemophilus influenzae 7/30 Sputum cult >> negative 8/4: Tracheal aspirate>>negative 8/9: Tracheal aspirate>>MRSA 8/10: RVP>>negative 8/10: Right nasal Wound culture>>STAPHYLOCOCCUS AUREUS, PROVOTELLA 8/10: MRSA PCR>> Positive 8/11: Blood culture 1/4>>Staphylococcus epidermidis MecA/C (suspect contaminant)  Antimicrobials:  Cefepime 7/27 >>7/31 Ceftriaxone 7/31>>8/4 Vancomycin 7/27 >> 7/29, restarted 7/30 (resumed due to fever spike on Maxipime) ? Resistant Strep pneumo >> 7/31 Unasyn 8/14>> Linezolid 8/11>>  Interim History / Subjective:  -No significant events noted overnight -Continued Low grade temperature overnight, T max 100.6 -Remains on vent: 40% FiO2, 5 PEEP -Slight improvement in encephalopathy ~ readily tracks, blinks eyes  to commands, grimaces to pain but doesn't withdraw -K 6.1  this am ~ getting HD with goal of 2-3L fluid removal  Objective   Blood pressure (!) 144/74, pulse (!) 101, temperature (!) 100.6 F (38.1 C), temperature source Axillary, resp. rate (!) 33, height $RemoveBe'5\' 9"'BdxslOxGb$  (1.753 m), weight (!) 204 kg, SpO2 91 %.    Vent Mode: PRVC FiO2 (%):  [30 %-45 %] 40 % Set Rate:  [16 bmp-30 bmp] 16 bmp Vt Set:  [560 mL] 560 mL PEEP:  [5 cmH20] 5 cmH20 Plateau Pressure:  [15 cmH20-19 cmH20] 19 cmH20   Intake/Output Summary (Last 24 hours) at 03/07/2022 3557 Last data filed at 03/07/2022 0600 Gross per 24 hour  Intake 1003.73 ml  Output 500 ml  Net 503.73 ml    Filed Weights   03/02/22 0155 03/05/22 0409 03/06/22 0500  Weight: (!) 197.4 kg (!) 205 kg (!) 204 kg    Examination: GENERAL:L gaze preference unchanged Grimace to pain but no motor response HENT: Atraumatic, normocephalic, neck supple, Tracheostomy dressing clean, dry and intact LUNGS: distant breath sounds, overbreathes the vent, even, nonlabored CV: RRR, s1s2, no M/R/G ABDOMEN: Obese, soft, nontender, no guarding or rebound tenderness, BS+ x4, liquid stool in bed MUSCULOSKELETAL: Ongoing marked edema, Ext warm NEURO: Eyes open, tracks, blinks eyes to commands, grimaces to pain but does not withdraw to pain, no motor response, pupils PERRL    Resolved Hospital Problem list   Community-acquired pneumonia: Streptococcus pneumoniae and Haemophilus influenzae ~ TREATED Hypernatremia  Assessment & Plan:  Acute on chronic hypoxemic and hypercapneic respiratory failure now s/p tracheostomy PMHx: OSA/OHS -Full vent support, implement lung protective strategies -Plateau pressures less than 30 cm H20 -Wean FiO2 & PEEP as tolerated to maintain O2 sats >92% -Follow intermittent Chest X-ray & ABG as needed -Spontaneous Breathing Trials when respiratory parameters met and mental status permits -Implement VAP Bundle -Prn Bronchodilators  MRSA  HCAP- on linezolid x 7 days Provetella sinusitis- unasyn, duration TBD Persistent low grade fever ~ suspect due to RUE DVT -Monitor fever curve -Trend WBC's & Procalcitonin -Follow cultures as above -ID following, appreciate input ~ Continue Linezolid &Unasyn per ID recommendations  Acute renal failure with hyperkalemia now on iHD, still markedly volume up -Monitor I&O's / urinary output -Follow BMP -Ensure adequate renal perfusion -Avoid nephrotoxic agents as able -Replace electrolytes as indicated -Nephrology following, appreciate input  DM2 with hyperglycemia- now on basal bolus -CBG's q4h; Target range of 140 to 180 -SSI & Semglee -Follow ICU Hypo/Hyperglycemia protocol  Ongoing severe toxic encephalopathy- MRI brain/C spine okay 8/15.  Still uremic.  EEG 8/16 neg.  -Avoid sedating medications as able -Ammonia normal, TSH normal -Will give more time,  if not improved by 8/21 may be worthwhile to bring neuro on board.  PICC associated DVT- PICC removed -Heparin gtt -Once PEG completed, can transition to Eliquis  - IR PEG consult -IR wanting to wait until fever free for 48 hrs before placing PEG -Will need repeat CT Abdomen   Best Practice (right click and "Reselect all SmartList Selections" daily)   Diet/type: tubefeeds DVT prophylaxis: systemic heparin GI prophylaxis: PPI Lines: HD catheter Foley:  Out Code Status:  full code Last date of multidisciplinary goals of care discussion [03/07/22]  Long term prognosis guarded  Critical Care Time: 35 minutes  Darel Hong, AGACNP-BC [facility] Prefer epic messenger for cross cover needs If after hours, please call E-link

## 2022-03-07 NOTE — Progress Notes (Signed)
ANTICOAGULATION CONSULT NOTE  Pharmacy Consult for IV Heparin  Indication: RUE DVT associated with PICC  Patient Measurements: Height: 5\' 9"  (175.3 cm) Weight:  (bed scale broken) IBW/kg (Calculated) : 70.7 Heparin Dosing Weight: 123.4 kg   Labs: Recent Labs    03/05/22 0419 03/06/22 0740 03/06/22 1342 03/06/22 1832 03/07/22 0417  HGB 8.1* 8.3*  --   --  8.7*  HCT 27.4* 27.3*  --   --  28.2*  PLT 229 249  --   --  211  HEPARINUNFRC  --   --  <0.10* 0.15* 0.22*  CREATININE 5.73* 5.11*  --   --   --    Estimated Creatinine Clearance: 32.4 mL/min (A) (by C-G formula based on SCr of 5.11 mg/dL (H)). Heparin Dosing Weight: 123.4 kg   Medical History: Past Medical History:  Diagnosis Date   Diabetes mellitus without complication ([place])    Hypertension    Pancreatitis    Assessment: Patient is a 46 y/o M with medical history including DM, HTN, pancreatitis, tobacco use disorder, systolic CHF, OSA, DM c/b diabetic neuropathy, lumbar radiculopathy, morbid obesity who is admitted with acute respiratory failure in setting of acute CHF, hypertensive urgency, and pneumonia. Patient with prolonged admission on ventilator complicated by acute renal failure. Upper extremity doppler noted "non-occlusive/nearly occlusive thrombus in one of paired right upper arm brachial veins containing the indwelling PICC line and occlusive thrombus in the right axillary vein". Pharmacy consulted to initiate and manage IV heparin for RUE DVT.  Patient is a hard stick and PICC has since been removed  Of note, patient was previously on heparin this admission and was therapeutic at 2000 units/hr. If difficulty obtaining blood draws, low threshold to empirically decrease infusion to this rate  .[email or web address]  Goal of Therapy:  Heparin level 0.3-0.7 units/ml Monitor platelets by anticoagulation protocol: Yes   Plan:  8/18:  HL @ 0417 = 0.22, SUBtherapeutic  - Will order heparin 1850 units IV X 1 bolus and  increase drip rate to 2800 units/hr.  - Will recheck HL 6 hrs after rate change.   --Daily CBC per protocol while on IV heparin --Plan to transition to apixaban after conclusion of procedures (pending PEG tube placement)  Celestina Gironda D 03/07/2022,4:46 AM

## 2022-03-07 NOTE — Progress Notes (Signed)
[facility]  PROGRESS NOTE   Subjective:   Patient awake and alert this AM and appears to track but not following commands consistently. Potassium high again at 6.1 with a BUN of 116 and creatinine of 6.08. Dialysis being planned for today.  Objective:  Vital signs: Blood pressure (!) 155/75, pulse 100, temperature 100.1 F (37.8 C), temperature source Oral, resp. rate (!) 32, height 5' 9" (1.753 m), weight (!) 204 kg, SpO2 93 %.  Intake/Output Summary (Last 24 hours) at 03/07/2022 1138 Last data filed at 03/07/2022 0600 Gross per 24 hour  Intake 573.73 ml  Output 400 ml  Net 173.73 ml    Filed Weights   03/02/22 0155 03/05/22 0409 03/06/22 0500  Weight: (!) 197.4 kg (!) 205 kg (!) 204 kg     Physical Exam: General:  No acute distress, resting comfortably  Head:  Normocephalic, atraumatic. Moist oral mucosal membranes  Eyes:  Anicteric  Lungs:   Vent assisted  Heart:  Regular on monitor  Abdomen:   Soft, nontender, bowel sounds present  Extremities: 1+ peripheral edema.  Neurologic: Awake, tracks with eyes  Skin:  No lesions  Access: Right IJ temp cath placed on 03/04/47    Basic Metabolic Panel: Recent Labs  Lab 02/28/22 2318 03/01/22 0520 03/01/22 1042 03/02/22 0313 03/02/22 0720 03/03/22 0511 03/03/22 1536 03/04/22 0440 03/05/22 0419 03/06/22 0740 03/07/22 0417  NA  --  143   < > 139   < > 131* 135 133* 134* 134* 134*  K 6.5* 6.3*   < > 5.9*   < > 5.3* 5.4* 4.1 4.8 5.2* 6.1*  CL  --  108   < > 101   < > 94* 96* 96* 97* 98 99  CO2  --  23   < > 27   < > _0 GLUCOSE  --  183*   < > 179*   < > 249* 175* 167* 141* 143* 142*  BUN  --  121*   < > 106*   < > 98* 105* 91* 118* 97* 116*  CREATININE  --  4.79*   < > 4.78*   < > 4.78* 5.60* 4.23* 5.73* 5.11* 6.08*  CALCIUM  --  7.8*   < > 7.6*   < > 7.2* 7.3* 7.3* 7.4* 7.5* 7.5*  MG 3.2* 3.3*  --  3.0*  --  2.9*  --  2.5*  --   --   --   PHOS  --  10.1*   < > 11.7*  --  10.8*  --  9.5*  >30.0* 10.3* 11.6*   < > = values in this interval not displayed.     CBC: Recent Labs  Lab 03/03/22 0511 03/04/22 0440 03/05/22 0419 03/06/22 0740 03/07/22 0417  WBC 13.1* 10.8* 10.8* 9.8 8.6  NEUTROABS 11.3* 8.8* 8.6* 7.6 6.5  HGB 8.1* 7.9* 8.1* 8.3* 8.7*  HCT 28.6* 27.0* 27.4* 27.3* 28.2*  MCV 93.8 90.6 88.7 86.4 86.0  PLT 198 195 229 249 211      Urinalysis: No results for input(s): "COLORURINE", "LABSPEC", "PHURINE", "GLUCOSEU", "HGBUR", "BILIRUBINUR", "KETONESUR", "PROTEINUR", "UROBILINOGEN", "NITRITE", "LEUKOCYTESUR" in the last 72 hours.  Invalid input(s): "APPERANCEUR"     Imaging: EEG adult  Result Date: 03/05/2022 Derek Jack, MD     03/05/2022  8:06 PM Routine EEG Report BURR SOFFER is a 46 y.o. male with a history of encephalopathy who is undergoing an EEG to  evaluate for seizures. Report: This EEG was acquired with electrodes placed according to the International 10-20 electrode system (including Fp1, Fp2, F3, F4, C3, C4, P3, P4, O1, O2, T3, T4, T5, T6, A1, A2, Fz, Cz, Pz). The following electrodes were missing or displaced: none. The occipital dominant rhythm was 5 Hz. This activity is reactive to stimulation. Drowsiness was manifested by background fragmentation; deeper stages of sleep were identified by K complexes and sleep spindles. There was no focal slowing. There were no interictal epileptiform discharges. There were no electrographic seizures identified. Photic stimulation and hyperventilation were not performed. Impression and clinical correlation: This EEG was obtained while awake and asleep and is abnormal due to mild diffuse slowing indicative of global cerebral dysfunction. Epileptiform abnormalities were not seen during this recording. Su Monks, MD [facility] [phone] If 7pm- 7am, please page neurology on call as listed in [place].   US Venous Img Upper Bilat (DVT)  Result Date: 03/05/2022 CLINICAL DATA:  Bilateral upper  extremity edema and indwelling right upper extremity PICC line. EXAM: BILATERAL UPPER EXTREMITY VENOUS DOPPLER ULTRASOUND TECHNIQUE: Gray-scale sonography with graded compression, as well as color Doppler and duplex ultrasound were performed to evaluate the bilateral upper extremity deep venous systems from the level of the subclavian vein and including the jugular, axillary, basilic, radial, ulnar and upper cephalic vein. Spectral Doppler was utilized to evaluate flow at rest and with distal augmentation maneuvers. COMPARISON:  None Available. FINDINGS: RIGHT UPPER EXTREMITY Internal Jugular Vein: No evidence of thrombus. Normal compressibility, respiratory phasicity and response to augmentation. Subclavian Vein: No evidence of thrombus. Normal compressibility, respiratory phasicity and response to augmentation. Axillary Vein: Occlusive thrombus identified. Cephalic Vein: No evidence of thrombus. Normal compressibility, respiratory phasicity and response to augmentation. Basilic Vein: No evidence of thrombus. Normal compressibility, respiratory phasicity and response to augmentation. Brachial Veins: Paired brachial veins present. The brachial vein containing the indwelling PICC line demonstrates nonocclusive thrombus which is nearly occlusive and demonstrates very little flow surrounding the indwelling PICC line. The other paired brachial vein is normally patent. Radial Veins: No evidence of thrombus. Normal compressibility, respiratory phasicity and response to augmentation. Ulnar Veins: No evidence of thrombus. Normal compressibility, respiratory phasicity and response to augmentation. Venous Reflux:  None. Other Findings: No evidence of superficial thrombophlebitis or abnormal fluid collection. LEFT UPPER EXTREMITY Internal Jugular Vein: No evidence of thrombus. Normal compressibility, respiratory phasicity and response to augmentation. Subclavian Vein: No evidence of thrombus. Normal compressibility,  respiratory phasicity and response to augmentation. Axillary Vein: No evidence of thrombus. Normal compressibility, respiratory phasicity and response to augmentation. Cephalic Vein: No evidence of thrombus. Normal compressibility, respiratory phasicity and response to augmentation. Basilic Vein: No evidence of thrombus. Normal compressibility, respiratory phasicity and response to augmentation. Brachial Veins: No evidence of thrombus. Normal compressibility, respiratory phasicity and response to augmentation. Radial Veins: No evidence of thrombus. Normal compressibility, respiratory phasicity and response to augmentation. Ulnar Veins: No evidence of thrombus. Normal compressibility, respiratory phasicity and response to augmentation. Venous Reflux:  None. Other Findings: No evidence of superficial thrombophlebitis or abnormal fluid collection. IMPRESSION: 1. Non-occlusive/nearly occlusive thrombus in one of paired right upper arm brachial veins containing the indwelling PICC line and occlusive thrombus in the right axillary vein. The visualized segment of the right subclavian vein is patent. 2. No evidence of left upper extremity DVT. Electronically Signed   By: Aletta Edouard M.D.   On: 03/05/2022 14:22     Medications:    sodium chloride 250 mL (03/03/22  2253)   anticoagulant sodium citrate     anticoagulant sodium citrate     feeding supplement (VITAL 1.5 CAL) 70 mL/hr at 03/07/22 0600   heparin 2,800 Units/hr (03/07/22 1056)    amoxicillin-clavulanate  1 tablet Per Tube BID   Chlorhexidine Gluconate Cloth  6 each Topical Q0600   famotidine  20 mg Per Tube QHS   feeding supplement (PROSource TF)  90 mL Per Tube TID   fiber supplement (BANATROL TF)  60 mL Per Tube BID   free water  30 mL Per Tube Q4H   heparin sodium (porcine)       insulin aspart  0-20 Units Subcutaneous Q4H   insulin aspart  4 Units Subcutaneous Q4H   insulin detemir  22 Units Subcutaneous BID   insulin starter kit- pen  needles  1 kit Other Once   ipratropium-albuterol  3 mL Nebulization Q6H   linezolid  600 mg Per Tube Q12H   living well with diabetes book   Does not apply Once   multivitamin  1 tablet Per Tube QHS   mupirocin ointment   Nasal BID   mouth rinse  15 mL Mouth Rinse Q2H   sodium chloride flush  10-40 mL Intracatheter Q12H    Assessment/ Plan:     Principal Problem:   Acute respiratory failure with hypoxia and hypercarbia (HCC) Active Problems:   Acute on chronic systolic CHF (congestive heart failure) (HCC)   Hypertensive urgency   Type 2 diabetes mellitus with peripheral neuropathy (HCC)   Acute heart failure with preserved ejection fraction (HCC)   Renal failure (ARF), acute on chronic (HCC)   Hospital-acquired pneumonia   ARDS (adult respiratory distress syndrome) ([place])  46 y.o. male with medical problems of poorly controlled diabetes, hypertension, systolic CHF, obstructive sleep apnea, obesity    admitted on 02/11/2022 for Acute respiratory failure (HCC) [J96.00] Respiratory failure (HCC) [J96.90] Peripheral edema [R60.9] Acute respiratory failure with hypoxia and hypercapnia (HCC) [J96.01, J96.02]   #1: Acute kidney injury: Patient with worsening renal insufficiency.  Acute kidney injury is most likely secondary to acute tubular necrosis possibly due to hypotension, fever and sepsis.  Patient became oligoanuric with metabolic acidosis and hyperkalemia.    We are planning for hemodialysis today using 1K bath given elevated potassium.  #2: Hyperkalemia: Serum potassium higher today at 6.1.  We are planning to use 1K bath for duration of the treatment.  #3: Congestive heart failure/volume overload: Continue ultrafiltration efforts with dialysis.   #4: Vent dependent respiratory failure: Now with tracheostomy.  Breathing comfortably at the moment.   #5: Sepsis: Continue linezolid and Augmentin.    LOS: 24 Mychal Decarlo [facility] 8/18/202311:38  AM

## 2022-03-07 NOTE — Consult Note (Signed)
PHARMACY CONSULT NOTE  Pharmacy Consult for Electrolyte Monitoring and Replacement   Recent Labs: Potassium (mmol/L)  Date Value  03/07/2022 6.1 (H)   Magnesium (mg/dL)  Date Value  03/04/2022 2.5 (H)   Calcium (mg/dL)  Date Value  03/07/2022 7.5 (L)   Albumin (g/dL)  Date Value  03/01/2022 1.9 (L)   Phosphorus (mg/dL)  Date Value  03/07/2022 11.6 (H)   Sodium (mmol/L)  Date Value  03/07/2022 134 (L)   Assessment: Patient is a 47 y/o M with medical history including DM, HTN, pancreatitis, tobacco use disorder, systolic CHF, OSA, DM c/b diabetic neuropathy, lumbar radiculopathy, morbid obesity who is admitted with acute respiratory failure in setting of acute CHF and hypertensive urgency. Patient is currently intubated, sedated, and on mechanical ventilation in the ICU. Pharmacy consulted to assist with electrolyte monitoring and replacement as indicated.  Nutrition: Tube feeds at 70 mL/hr (~ 1.68 L/day) + free water 30 mL q4h (180 mL/day)   Nephrology consulted for renal dysfunction and to assist with hypernatremia management. HD started 8/12  Goal of Therapy:  Electrolytes within normal limits  Plan:  --Hypernatremia resolved, fluid management per PCCM and nephrology --K 6.1, management per PCCM and nephrology --Magnesium & phosphorous elevated in setting of renal dysfunction --Follow-up electrolytes with AM labs tomorrow  Samuel Chavez  03/07/2022 7:56 AM

## 2022-03-08 DIAGNOSIS — J9601 Acute respiratory failure with hypoxia: Secondary | ICD-10-CM | POA: Diagnosis not present

## 2022-03-08 DIAGNOSIS — L8990 Pressure ulcer of unspecified site, unspecified stage: Secondary | ICD-10-CM | POA: Insufficient documentation

## 2022-03-08 DIAGNOSIS — J9602 Acute respiratory failure with hypercapnia: Secondary | ICD-10-CM | POA: Diagnosis not present

## 2022-03-08 LAB — BASIC METABOLIC PANEL
Anion gap: 12 (ref 5–15)
BUN: 97 mg/dL — ABNORMAL HIGH (ref 6–20)
CO2: 26 mmol/L (ref 22–32)
Calcium: 7.6 mg/dL — ABNORMAL LOW (ref 8.9–10.3)
Chloride: 98 mmol/L (ref 98–111)
Creatinine, Ser: 4.99 mg/dL — ABNORMAL HIGH (ref 0.61–1.24)
GFR, Estimated: 14 mL/min — ABNORMAL LOW (ref >60)
Glucose, Bld: 123 mg/dL — ABNORMAL HIGH (ref 70–99)
Potassium: 4.8 mmol/L (ref 3.5–5.1)
Sodium: 136 mmol/L (ref 135–145)

## 2022-03-08 LAB — CBC WITH DIFFERENTIAL/PLATELET
Abs Immature Granulocytes: 0.07 10*3/uL (ref 0.00–0.07)
Basophils Absolute: 0 10*3/uL (ref 0.0–0.1)
Basophils Relative: 0 %
Eosinophils Absolute: 0.5 10*3/uL (ref 0.0–0.5)
Eosinophils Relative: 5 %
HCT: 28.2 % — ABNORMAL LOW (ref 39.0–52.0)
Hemoglobin: 8.5 g/dL — ABNORMAL LOW (ref 13.0–17.0)
Immature Granulocytes: 1 %
Lymphocytes Relative: 13 %
Lymphs Abs: 1.2 10*3/uL (ref 0.7–4.0)
MCH: 26.4 pg (ref 26.0–34.0)
MCHC: 30.1 g/dL (ref 30.0–36.0)
MCV: 87.6 fL (ref 80.0–100.0)
Monocytes Absolute: 0.7 10*3/uL (ref 0.1–1.0)
Monocytes Relative: 7 %
Neutro Abs: 6.8 10*3/uL (ref 1.7–7.7)
Neutrophils Relative %: 74 %
Platelets: 249 10*3/uL (ref 150–400)
RBC: 3.22 MIL/uL — ABNORMAL LOW (ref 4.22–5.81)
RDW: 15.9 % — ABNORMAL HIGH (ref 11.5–15.5)
WBC: 9.2 10*3/uL (ref 4.0–10.5)
nRBC: 0 % (ref 0.0–0.2)

## 2022-03-08 LAB — GLUCOSE, CAPILLARY
Glucose-Capillary: 114 mg/dL — ABNORMAL HIGH (ref 70–99)
Glucose-Capillary: 127 mg/dL — ABNORMAL HIGH (ref 70–99)
Glucose-Capillary: 120 mg/dL — ABNORMAL HIGH (ref 70–99)
Glucose-Capillary: 133 mg/dL — ABNORMAL HIGH (ref 70–99)
Glucose-Capillary: 126 mg/dL — ABNORMAL HIGH (ref 70–99)
Glucose-Capillary: 115 mg/dL — ABNORMAL HIGH (ref 70–99)

## 2022-03-08 LAB — HEPARIN LEVEL (UNFRACTIONATED): Heparin Unfractionated: 0.44 IU/mL (ref 0.30–0.70)

## 2022-03-08 LAB — PHOSPHORUS: Phosphorus: 10 mg/dL — ABNORMAL HIGH (ref 2.5–4.6)

## 2022-03-08 LAB — MAGNESIUM: Magnesium: 2.7 mg/dL — ABNORMAL HIGH (ref 1.7–2.4)

## 2022-03-08 LAB — C-REACTIVE PROTEIN: CRP: 4 mg/dL — ABNORMAL HIGH (ref <1.0)

## 2022-03-08 NOTE — Consult Note (Signed)
PHARMACY CONSULT NOTE  Pharmacy Consult for Electrolyte Monitoring and Replacement   Recent Labs: Potassium (mmol/L)  Date Value  03/08/2022 4.8   Magnesium (mg/dL)  Date Value  03/08/2022 2.7 (H)   Calcium (mg/dL)  Date Value  03/08/2022 7.6 (L)   Albumin (g/dL)  Date Value  03/01/2022 1.9 (L)   Phosphorus (mg/dL)  Date Value  03/08/2022 10.0 (H)   Sodium (mmol/L)  Date Value  03/08/2022 136   Assessment: Patient is a 46 y/o M with medical history including DM, HTN, pancreatitis, tobacco use disorder, systolic CHF, OSA, DM c/b diabetic neuropathy, lumbar radiculopathy, morbid obesity who is admitted with acute respiratory failure in setting of acute CHF and hypertensive urgency. Patient is currently intubated, sedated, and on mechanical ventilation in the ICU. Pharmacy consulted to assist with electrolyte monitoring and replacement as indicated.  Nutrition: Tube feeds at 70 mL/hr (~ 1.68 L/day) + free water 30 mL q4h (180 mL/day)   Nephrology consulted for renal dysfunction and to assist with hypernatremia management. HD started 8/12  Goal of Therapy:  Electrolytes within normal limits  Plan:  --Hypernatremia resolved, fluid management per PCCM and nephrology --Magnesium & phosphorous elevated in setting of renal dysfunction, nephrology and PCCM following  --Follow-up electrolytes with AM labs tomorrow  Pearla Dubonnet  03/08/2022 9:33 AM

## 2022-03-08 NOTE — Progress Notes (Signed)
ANTICOAGULATION CONSULT NOTE  Pharmacy Consult for IV Heparin  Indication: RUE DVT associated with PICC  Patient Measurements: Height: 5\' 9"  (175.3 cm) Weight:  (bed scale broken) IBW/kg (Calculated) : 70.7 Heparin Dosing Weight: 123.4 kg   Labs: Recent Labs    03/06/22 0740 03/06/22 1342 03/07/22 0417 03/07/22 1247 03/07/22 2118 03/08/22 0517  HGB 8.3*  --  8.7*  --   --  8.5*  HCT 27.3*  --  28.2*  --   --  28.2*  PLT 249  --  211  --   --  249  HEPARINUNFRC  --    < > 0.22* 0.50 0.40 0.44  CREATININE 5.11*  --  6.08*  --   --  4.99*   < > = values in this interval not displayed.   Estimated Creatinine Clearance: 33.1 mL/min (A) (by C-G formula based on SCr of 4.99 mg/dL (H)).  Medical History: Past Medical History:  Diagnosis Date   Diabetes mellitus without complication ([place])    Hypertension    Pancreatitis    Assessment: Patient is a 46 y/o M with medical history including DM, HTN, pancreatitis, tobacco use disorder, systolic CHF, OSA, DM c/b diabetic neuropathy, lumbar radiculopathy, morbid obesity who is admitted with acute respiratory failure in setting of acute CHF, hypertensive urgency, and pneumonia. Patient with prolonged admission on ventilator complicated by acute renal failure. Upper extremity doppler noted "non-occlusive/nearly occlusive thrombus in one of paired right upper arm brachial veins containing the indwelling PICC line and occlusive thrombus in the right axillary vein". Pharmacy consulted to initiate and manage IV heparin for RUE DVT.  Patient is a hard stick and PICC has since been removed  Goal of Therapy:  Heparin level 0.3-0.7 units/ml Monitor platelets by anticoagulation protocol: Yes   Plan:  8/19:  HL @ 0517 = 0.44, therapeutic X 3 - Will continue pt on current rate and recheck HL on 8/20 with AM labs. --Daily CBC per protocol while on IV heparin --Plan to transition to apixaban after conclusion of procedures (pending PEG tube  placement)  Pao Haffey D 03/08/2022,6:25 AM

## 2022-03-08 NOTE — Progress Notes (Signed)
NAME:  Samuel Chavez, MRN:  [id number], DOB:  18-Apr-1976, LOS: 41 ADMISSION DATE:  02/11/2022  CC follow up RESP FAILURE History of Present Illness/SYNOPSOS  46 y.o male with significant PMH as below who presented to the ED with chief complaints of SOB and worsening bilateral lower extremities edema. Patient with diagnosis of severe CAP associated decompensated diastolic heart failure with morbid obesity and OSA/OHS leading to progressive worsening resp failure complicated by acute cardio-renal syndrome with failure to wean from vent    Pertinent  Medical History   Past Medical History:  Diagnosis Date   Diabetes mellitus without complication ([place])    Hypertension    Pancreatitis     Significant Hospital Events: Including procedures, antibiotic start and stop dates in addition to other pertinent events   7/25: Admitted to hospitalist service w/acute on chronic hypoxic hypercapnic resp. failure requiring BiPAP.Failed BiPAP and intubated. PCCM consulted 7/26: INTUBATED, difficlut to sedate, started [place] 7/27: severe hypoxia, PEEP increased to 15 but decreased back to 10 7/28: Persistent severe hypoxia, ventilator changes made 7/29: Persistent hypoxia, recruitment maneuvers instituted, ventilator changes made, empiric heparin as patient cannot be scanned query PE -02/17/22- patient is severely critically ill with bilateral multifocal pneumonia on sedation and paralysis with mechanical ventilation maximal settings.  He is at cusp of death, we met with daughter today and discussed severity of illness. Unable to perform CT due to severity of critical illness. Met with previous PCCM doc and discussed medical plan.  Patient had recruitment maneuvers last few days without improvement, on heparin gtt for possible PE.  02/18/22- patient continues to require maximal settings on ventilator despite good UOP.  He destaturated to <50% spO2 on MV today required BGV.   02/19/22- patient  continues to require 100% FiO2 on maximal setting with high PEEP ladder for possible ARDS.  He was diuresed and on steroids with development of AKI.  His CXR had slight interval improvement on right. We discussed case with vascular surgery regarding possible empiric tPA for PE.  02/20/22- patient was unable to get CT today due to continued severe hypoxemia.  Daughter and other family members at bedside we reviewed case and medical plan.  There seems to be some confusion from family as they had asked me to wake patient up and take off ventilator even though I had explained multiple times that he is at very high risk for death.  They laughed at my comments and seemed to think it was not true. We may still be able to do bronchoscopy but its high risk.  We have reduced IV infusions by switching some medications to OGT route.  02/21/22- patient is weaned to 60%FiO2.  Plan for possible bronch if patient is tolerating.  Family at bedside.  If able to we will obtain more imaging and VQ scan to rule out PE.  02/22/22- Patient weaned to 50% on PRVC.  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 CBC stable , CMP with improved GFR. Monitoring Na, pharmacy and renal following for electrolytes/renal function.  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  Overall marked improvement on ventilator over past 48h. Met with daughter at bedside reviewed medical plan.  02/24/22-Vent requirements continue to slowly improve, currently 40% FiO2 and 14 PEEP.  AKI continues to slowly improve, remains hyperkalemic with potassium of 5.8.  Placed back on [place], Diurese x1.  Trach scheduled for tomorrow at  1:15 PM 02/25/22- Diurese with Lasix x1 dose. Trach scheduled for today. 02/26/22: Remains mechanically ventilated via newly placed tracheostomy will attempt to wean ventilator settings today.  Perform WUA  02/26/22: Pt remains mechanically ventilated via  tracheostomy vent settings: PEEP 14/FiO2 80% 02/27/22: Fevers, ID consulted. Repeating Tracheal aspirate and UA.  Zosyn started 02/28/22: Persistent fevers, Tracheal aspirate with Staph aureus, start Linezolid.  Worsening Creatinine, considering Lasix gtt given high vent requirements (100% FiO2, 14 peep).  D/c fentanyl gtt and decrease oral benzos/narcotics 03/01/22: worsening anuric renal failure. HD line placed and started on hemodialysis. 03/02/22: Vent support slowly improving. Plan for HD again today. 8/14-8/15: remains febrile, encephalopathic despite HD sessions 8/16 fever curve improved, clots noted in LUE 8/18: continued low grade fever (t max 100.6).  Receiving HD.  Slight improvement in encephalopathy, HD today 8/19 off pressors, off sedation, remains on vent   Consults:  PCCM Nephrology Vascular Surgery Palliative Care ENT Infectious Disease  Procedures:  7/25: Intubation 7/26: PICC triple-lumen, right basilic vein 8/84: Left radial arterial line  8/08: Size 8 proximal XLT [place] inserted 8/12: Right IJ HD catheter placed  Significant Diagnostic Tests:  7/25  Chest Xray:Chest x-ray showed cardiomegaly and pulmonary vascular congestion without overt pulmonary edema 7/25 Echocardiogram: difficult study, LVEF was estimated at 55 to 60%, dilated LV moderate LVH, grade 1 DD, enlargement of the right ventricle, left atrial size moderately dilated, right atrial size moderately dilated this is consistent with restrictive physiology (echo reading not congruous with prior echoes from [facility]) 7/29: Venous US BLE>>IMPRESSION: No lower extremity DVT. 7/30 Limited echo: LVEF 40 to 45% decreased LV function, no wall motion abnormalities, moderate dilation of the LV concentric LVH, right ventricular size moderately enlarged, right atrial enlargement, this is more consistent with prior [facility] scans 8/4: CT Head>>No acute intracranial abnormality. Paranasal sinusitis.  Bilateral complete opacification of the mastoid air cells and inner ears, as can be seen in the setting of otitis media. Correlate with symptoms. 8/4: CT Chest/Abdomen/Pelvis>>Extensive ground-glass, alveolar and patchy dense infiltrates in both lungs suggesting multifocal pneumonia. Part of this finding may suggest underlying pulmonary edema. Small bilateral pleural effusions are seen. Cardiomegaly. There is ectasia of the main pulmonary artery suggesting pulmonary arterial hypertension. There is no evidence of intestinal obstruction or pneumoperitoneum. There is no hydronephrosis. Appendix is not dilated. UB diverticula are seen in the colon without signs of focal diverticulitis. Severe degenerative changes are noted at L4-L5 level with significant interval worsening. Findings may be due to severe disc degeneration. If there is clinical suspicion for discitis or osteomyelitis, follow-up MRI may be considered 8/4: Lung V/Q>>Pulmonary embolism absent. 8/16: RUE brachial and axillary vein DVT associated with PICC line  Micro Data:  7/25: SARS-CoV-2 PCR> negative 7/25: MRSA PCR>> NEG 7/27: Strep pneumoniae Ag>> negative 7/27: Legionella Ag>> negative 7/27: Mycoplasma>> <770 7/27: Sputum>> Strep pneumo,Haemophilus influenzae 7/30 Sputum cult >> negative 8/4: Tracheal aspirate>>negative 8/9: Tracheal aspirate>>MRSA 8/10: RVP>>negative 8/10: Right nasal Wound culture>>STAPHYLOCOCCUS AUREUS, PROVOTELLA 8/10: MRSA PCR>> Positive 8/11: Blood culture 1/4>>Staphylococcus epidermidis MecA/C (suspect contaminant)  Antimicrobials:  Cefepime 7/27 >>7/31 Ceftriaxone 7/31>>8/4 Vancomycin 7/27 >> 7/29, restarted 7/30 (resumed due to fever spike on Maxipime) ? Resistant Strep pneumo >> 7/31 Unasyn 8/14>> Linezolid 8/11>>  Interim History / Subjective:  Remains on vent Remains encephalopathic Low grade fevers since 1 week Remains critically ill   Objective   Blood pressure (!) 155/54, pulse 92,  temperature 100 F (37.8 C), temperature source Axillary, resp. rate (!) 28,  height '5\' 9"'$  (1.753 m), weight (!) 204 kg, SpO2 92 %.    Vent Mode: PRVC FiO2 (%):  [40 %-45 %] 45 % Set Rate:  [16 bmp] 16 bmp Vt Set:  [560 mL] 560 mL PEEP:  [5 cmH20] 5 cmH20 Plateau Pressure:  [25 cmH20-26 cmH20] 25 cmH20   Intake/Output Summary (Last 24 hours) at 03/08/2022 2751 Last data filed at 03/08/2022 0700 Gross per 24 hour  Intake 2664.79 ml  Output 4350 ml  Net -1685.21 ml    Filed Weights   03/05/22 0409 03/06/22 0500  Weight: (!) 205 kg (!) 204 kg    REVIEW OF SYSTEMS  PATIENT IS UNABLE TO PROVIDE COMPLETE REVIEW OF SYSTEMS DUE TO SEVERE CRITICAL ILLNESS    PHYSICAL EXAMINATION:  GENERAL:critically ill appearing,  EYES: Pupils equal, round, reactive to light.  No scleral icterus.  MOUTH: Moist mucosal membrane. TRACH IN PLACE,  DUBHOFF IN PLACE NECK: Supple.  PULMONARY: +rhonchi, +wheezing CARDIOVASCULAR: S1 and S2.  No murmurs  GASTROINTESTINAL: Soft, nontender, -distended. Positive bowel sounds.  MUSCULOSKELETAL: No swelling, clubbing, or edema.  NEUROLOGIC: obtunded     Hospital Problem list   Community-acquired pneumonia: Streptococcus pneumoniae and Haemophilus influenzae ~ TREATED Hypernatremia  Assessment & Plan:   46 y.o male with significant PMH as below who presented to the ED with chief complaints of SOB and worsening bilateral lower extremities edema. Patient with diagnosis of  severe CAPdecompensated diastolic heart failure with morbid obesity and OSA/OHS leading to progressive worsening resp failure complicated by acute cardio-renal syndrome with failure to wean from vent s/p trach   Severe ACUTE Hypoxic and Hypercapnic Respiratory Failure -continue Mechanical Ventilator support -Wean Fio2 and PEEP as tolerated -VAP/VENT bundle implementation - Wean PEEP & FiO2 as tolerated, maintain SpO2 > 88% - Head of bed elevated 30 degrees, VAP protocol in place -  Plateau pressures less than 30 cm H20  - Intermittent chest x-ray & ABG PRN - Ensure adequate pulmonary hygiene  -will perform SAT/SBT when respiratory parameters are met Will try PS 5/5 today  INFECTIOUS DISEASE STREP PNEUMONIA/H INFLUENZA PNEUMONIA MRSA HCAP- on linezolid x 7 days Provetella sinusitis- unasyn, duration TBD Persistent low grade fever ~ suspect due to RUE DVT -Monitor fever curve -Trend WBC's & Procalcitonin -Follow cultures as above -ID following, appreciate input ~ Continue Linezolid &Unasyn per ID recommendations  ACUTE KIDNEY INJURY/Renal Failure -continue Foley Catheter-assess need -Avoid nephrotoxic agents -Follow urine output, BMP -Ensure adequate renal perfusion, optimize oxygenation -Renal dose medications   Intake/Output Summary (Last 24 hours) at 03/08/2022 0732 Last data filed at 03/08/2022 0700 Gross per 24 hour  Intake 2664.79 ml  Output 4350 ml  Net -1685.21 ml    Ongoing severe toxic encephalopathy-  MRI brain/C spine okay 8/15.   Still uremic BUN 97  EEG 8/16 neg.  -Avoid sedating medications as able -Ammonia normal, TSH normal -HD as needed   ENDO - ICU hypoglycemic\Hyperglycemia protocol -check FSBS per protocol DM2 with hyperglycemia- now on basal bolus -CBG's q4h; Target range of 140 to 180 -SSI & Semglee -Follow ICU Hypo/Hyperglycemia protocol   GI GI PROPHYLAXIS as indicated  NUTRITIONAL STATUS DIET-->TF's as tolerated Constipation protocol as indicated Will try for PEG tube via IR when afebrile, will need CT abd prior to placement   ELECTROLYTES -follow labs as needed -replace as needed -pharmacy consultation and following   PICC associated DVT- PICC removed -Heparin gtt -Once PEG completed, can transition to Eliquis  - IR PEG consult -IR wanting  to wait until fever free for 48 hrs before placing PEG -Will need repeat CT Abdomen   Best Practice (right click and "Reselect all SmartList Selections" daily)    Diet/type: tubefeeds DVT prophylaxis: systemic heparin GI prophylaxis: PPI Lines: HD catheter Foley:  Out Code Status:  full code Last date of multidisciplinary goals of care discussion [03/07/22]  Long term prognosis guarded    DVT/GI PRX  assessed I Assessed the need for Labs I Assessed the need for Foley I Assessed the need for Central Venous Line Family Discussion when available I Assessed the need for Mobilization I made an Assessment of medications to be adjusted accordingly Safety Risk assessment completed  CASE DISCUSSED IN MULTIDISCIPLINARY ROUNDS WITH ICU TEAM     Critical Care Time devoted to patient care services described in this note is 45 minutes.   Overall, patient is critically ill, prognosis is guarded.  Patient with Multiorgan failure and at high risk for cardiac arrest and death.    Corrin Parker, M.D.  [facility]  Medical Director [place] Director [facility] Cardio-Pulmonary Department

## 2022-03-08 NOTE — Plan of Care (Signed)
Neuro: intermittently nodding and shaking head to respond to questions, able to shake head no to decide on TV show, will open and close mouth on command for oral care Resp: Stable on PS/CPAP 15/5 throughout the day today, secretions improving CV: TMAX 99.5 orally, vital signs fairly stable, edema significantly improving GIGU: external catheter being tolerated well, good UOP, BM x 1, continues to tolerate tube feeds well Skin: popped blister on left buttock has developed into a stage 2 wound, mid chest wound beneath trach is a stage 2-recommend starting juven for healing support  Social: Daughter Seward Carol called for an update this AM, all questions and concerns addressed  Problem: Education: Goal: Ability to demonstrate management of disease process will improve Outcome: Not Progressing Goal: Ability to verbalize understanding of medication therapies will improve Outcome: Not Progressing   Problem: Activity: Goal: Capacity to carry out activities will improve Outcome: Not Progressing   Problem: Cardiac: Goal: Ability to achieve and maintain adequate cardiopulmonary perfusion will improve Outcome: Not Progressing   Problem: Education: Goal: Knowledge of General Education information will improve Description: Including pain rating scale, medication(s)/side effects and non-pharmacologic comfort measures Outcome: Not Progressing   Problem: Health Behavior/Discharge Planning: Goal: Ability to manage health-related needs will improve Outcome: Not Progressing   Problem: Clinical Measurements: Goal: Ability to maintain clinical measurements within normal limits will improve Outcome: Not Progressing Goal: Will remain free from infection Outcome: Not Progressing Goal: Diagnostic test results will improve Outcome: Not Progressing Goal: Respiratory complications will improve Outcome: Not Progressing Goal: Cardiovascular complication will be avoided Outcome: Not Progressing   Problem:  Activity: Goal: Risk for activity intolerance will decrease Outcome: Not Progressing   Problem: Nutrition: Goal: Adequate nutrition will be maintained Outcome: Not Progressing   Problem: Elimination: Goal: Will not experience complications related to bowel motility Outcome: Not Progressing Goal: Will not experience complications related to urinary retention Outcome: Not Progressing   Problem: Pain Managment: Goal: General experience of comfort will improve Outcome: Not Progressing   Problem: Safety: Goal: Ability to remain free from injury will improve Outcome: Not Progressing   Problem: Skin Integrity: Goal: Risk for impaired skin integrity will decrease Outcome: Not Progressing   Problem: Coping: Goal: Ability to adjust to condition or change in health will improve Outcome: Not Progressing   Problem: Fluid Volume: Goal: Ability to maintain a balanced intake and output will improve Outcome: Not Progressing   Problem: Health Behavior/Discharge Planning: Goal: Ability to identify and utilize available resources and services will improve Outcome: Not Progressing Goal: Ability to manage health-related needs will improve Outcome: Not Progressing   Problem: Metabolic: Goal: Ability to maintain appropriate glucose levels will improve Outcome: Not Progressing   Problem: Nutritional: Goal: Maintenance of adequate nutrition will improve Outcome: Not Progressing Goal: Progress toward achieving an optimal weight will improve Outcome: Not Progressing   Problem: Skin Integrity: Goal: Risk for impaired skin integrity will decrease Outcome: Not Progressing   Problem: Tissue Perfusion: Goal: Adequacy of tissue perfusion will improve Outcome: Not Progressing   Problem: Activity: Goal: Ability to tolerate increased activity will improve Outcome: Not Progressing   Problem: Respiratory: Goal: Ability to maintain a clear airway and adequate ventilation will improve Outcome:  Not Progressing   Problem: Role Relationship: Goal: Method of communication will improve Outcome: Not Progressing

## 2022-03-08 NOTE — Progress Notes (Signed)
[facility]  PROGRESS NOTE   Subjective:   Patient awake this AM and appears to track but not following commands consistently. Remains critically ill. Underwent hemodialysis successfully yesterday.  Potassium now corrected.  Objective:  Vital signs: Blood pressure (!) 155/54, pulse 92, temperature 100 F (37.8 C), temperature source Axillary, resp. rate (!) 28, height 5\' 9"  (1.753 m), weight (!) 204 kg, SpO2 90 %.  Intake/Output Summary (Last 24 hours) at 03/08/2022 0759 Last data filed at 03/08/2022 0700 Gross per 24 hour  Intake 2664.79 ml  Output 4350 ml  Net -1685.21 ml    Filed Weights   03/05/22 0409 03/06/22 0500  Weight: (!) 205 kg (!) 204 kg     Physical Exam: General:  No acute distress, resting comfortably  Head:  Normocephalic, atraumatic. Moist oral mucosal membranes  Eyes:  Anicteric  Lungs:   Vent assisted, tracheostomy in place  Heart:  Regular on monitor  Abdomen:   Soft, nontender,  Extremities: 1+ peripheral edema.  Neurologic: Awake, tracks with eyes  Skin:  No lesions  Access: Right IJ temp cath placed on 1/61/09    Basic Metabolic Panel: Recent Labs  Lab 03/02/22 0313 03/02/22 0720 03/03/22 0511 03/03/22 1536 03/04/22 0440 03/05/22 0419 03/06/22 0740 03/07/22 0417 03/08/22 0517  NA 139   < > 131*   < > 133* 134* 134* 134* 136  K 5.9*   < > 5.3*   < > 4.1 4.8 5.2* 6.1* 4.8  CL 101   < > 94*   < > 96* 97* 98 99 98  CO2 27   < > 22   < > 25 23 24 22 26   GLUCOSE 179*   < > 249*   < > 167* 141* 143* 142* 123*  BUN 106*   < > 98*   < > 91* 118* 97* 116* 97*  CREATININE 4.78*   < > 4.78*   < > 4.23* 5.73* 5.11* 6.08* 4.99*  CALCIUM 7.6*   < > 7.2*   < > 7.3* 7.4* 7.5* 7.5* 7.6*  MG 3.0*  --  2.9*  --  2.5*  --   --   --  2.7*  PHOS 11.7*  --  10.8*  --  9.5* >30.0* 10.3* 11.6* 10.0*   < > = values in this interval not displayed.     CBC: Recent Labs  Lab 03/04/22 0440 03/05/22 0419 03/06/22 0740 03/07/22 0417  03/08/22 0517  WBC 10.8* 10.8* 9.8 8.6 9.2  NEUTROABS 8.8* 8.6* 7.6 6.5 6.8  HGB 7.9* 8.1* 8.3* 8.7* 8.5*  HCT 27.0* 27.4* 27.3* 28.2* 28.2*  MCV 90.6 88.7 86.4 86.0 87.6  PLT 195 229 249 211 249      Urinalysis: No results for input(s): "COLORURINE", "LABSPEC", "PHURINE", "GLUCOSEU", "HGBUR", "BILIRUBINUR", "KETONESUR", "PROTEINUR", "UROBILINOGEN", "NITRITE", "LEUKOCYTESUR" in the last 72 hours.  Invalid input(s): "APPERANCEUR"     Imaging: No results found.   Medications:    sodium chloride 250 mL (03/03/22 2253)   anticoagulant sodium citrate     anticoagulant sodium citrate     feeding supplement (VITAL 1.5 CAL) 70 mL/hr at 03/08/22 0700   heparin 2,800 Units/hr (03/08/22 0700)    amoxicillin-clavulanate  1 tablet Per Tube BID   carvedilol  3.125 mg Per Tube BID WC   Chlorhexidine Gluconate Cloth  6 each Topical Q0600   famotidine  20 mg Per Tube QHS   feeding supplement (PROSource TF)  90 mL Per Tube TID  fiber supplement (BANATROL TF)  60 mL Per Tube BID   free water  30 mL Per Tube Q4H   insulin aspart  0-20 Units Subcutaneous Q4H   insulin aspart  4 Units Subcutaneous Q4H   insulin detemir  22 Units Subcutaneous BID   ipratropium-albuterol  3 mL Nebulization Q6H   linezolid  600 mg Per Tube Q12H   multivitamin  1 tablet Per Tube QHS   mupirocin ointment   Nasal BID   mouth rinse  15 mL Mouth Rinse Q2H    Assessment/ Plan:     46 y.o. male with medical problems of poorly controlled diabetes, hypertension, systolic CHF, obstructive sleep apnea, obesity    admitted on 02/11/2022 for  Principal Problem:   Acute respiratory failure with hypoxia and hypercarbia (HCC) Active Problems:   Acute on chronic systolic CHF (congestive heart failure) (HCC)   Hypertensive urgency   Type 2 diabetes mellitus with peripheral neuropathy (HCC)   Acute heart failure with preserved ejection fraction (HCC)   Renal failure (ARF), acute on chronic ([place])   Hospital-acquired  pneumonia   ARDS (adult respiratory distress syndrome) ([place])    #1: Acute kidney injury: Patient with worsening renal insufficiency.  Acute kidney injury is most likely secondary to acute tubular necrosis possibly due to hypotension, fever and sepsis.  Patient became oligoanuric with metabolic acidosis and hyperkalemia.   -Requiring hemodialysis support.  Tolerated yesterday.  Urine output recorded at 1350 cc.  3000 cc removed with hemodialysis on Friday. -Next hemodialysis tentatively planned for Monday.  #2: Hyperkalemia:  -Corrected with dialysis treatment.  #3: Congestive heart failure/volume overload: Continue ultrafiltration efforts with dialysis.   #4: Vent dependent respiratory failure: Now with tracheostomy.  Breathing comfortably at the moment.   #5: Sepsis: Continue linezolid and Augmentin.  Management as per ICU and ID teams.    LOS: [facility] 8/19/20237:59 AM

## 2022-03-09 LAB — CBC WITH DIFFERENTIAL/PLATELET
Abs Immature Granulocytes: 0.04 10*3/uL (ref 0.00–0.07)
Basophils Absolute: 0 10*3/uL (ref 0.0–0.1)
Basophils Relative: 0 %
Eosinophils Absolute: 0.5 10*3/uL (ref 0.0–0.5)
Eosinophils Relative: 7 %
HCT: 28.7 % — ABNORMAL LOW (ref 39.0–52.0)
Hemoglobin: 8.4 g/dL — ABNORMAL LOW (ref 13.0–17.0)
Immature Granulocytes: 1 %
Lymphocytes Relative: 12 %
Lymphs Abs: 1 10*3/uL (ref 0.7–4.0)
MCH: 26.1 pg (ref 26.0–34.0)
MCHC: 29.3 g/dL — ABNORMAL LOW (ref 30.0–36.0)
MCV: 89.1 fL (ref 80.0–100.0)
Monocytes Absolute: 0.6 10*3/uL (ref 0.1–1.0)
Monocytes Relative: 8 %
Neutro Abs: 5.7 10*3/uL (ref 1.7–7.7)
Neutrophils Relative %: 72 %
Platelets: 224 10*3/uL (ref 150–400)
RBC: 3.22 MIL/uL — ABNORMAL LOW (ref 4.22–5.81)
RDW: 15.7 % — ABNORMAL HIGH (ref 11.5–15.5)
WBC: 7.9 10*3/uL (ref 4.0–10.5)
nRBC: 0 % (ref 0.0–0.2)

## 2022-03-09 LAB — GLUCOSE, CAPILLARY
Glucose-Capillary: 119 mg/dL — ABNORMAL HIGH (ref 70–99)
Glucose-Capillary: 131 mg/dL — ABNORMAL HIGH (ref 70–99)
Glucose-Capillary: 122 mg/dL — ABNORMAL HIGH (ref 70–99)
Glucose-Capillary: 125 mg/dL — ABNORMAL HIGH (ref 70–99)
Glucose-Capillary: 118 mg/dL — ABNORMAL HIGH (ref 70–99)
Glucose-Capillary: 129 mg/dL — ABNORMAL HIGH (ref 70–99)

## 2022-03-09 LAB — BASIC METABOLIC PANEL
Anion gap: 12 (ref 5–15)
BUN: 101 mg/dL — ABNORMAL HIGH (ref 6–20)
CO2: 25 mmol/L (ref 22–32)
Calcium: 8 mg/dL — ABNORMAL LOW (ref 8.9–10.3)
Chloride: 102 mmol/L (ref 98–111)
Creatinine, Ser: 5.26 mg/dL — ABNORMAL HIGH (ref 0.61–1.24)
GFR, Estimated: 13 mL/min — ABNORMAL LOW (ref >60)
Glucose, Bld: 140 mg/dL — ABNORMAL HIGH (ref 70–99)
Potassium: 5.3 mmol/L — ABNORMAL HIGH (ref 3.5–5.1)
Sodium: 139 mmol/L (ref 135–145)

## 2022-03-09 LAB — HEPARIN LEVEL (UNFRACTIONATED): Heparin Unfractionated: 0.46 IU/mL (ref 0.30–0.70)

## 2022-03-09 LAB — C-REACTIVE PROTEIN: CRP: 2.4 mg/dL — ABNORMAL HIGH (ref <1.0)

## 2022-03-09 LAB — MAGNESIUM: Magnesium: 2.8 mg/dL — ABNORMAL HIGH (ref 1.7–2.4)

## 2022-03-09 LAB — PHOSPHORUS: Phosphorus: 11.7 mg/dL — ABNORMAL HIGH (ref 2.5–4.6)

## 2022-03-09 MED ORDER — SEVELAMER CARBONATE 2.4 G PO PACK
2.4000 g | PACK | Freq: Three times a day (TID) | ORAL | Status: DC
Start: 2022-03-09 — End: 2022-03-18
  Administered 2022-03-09 – 2022-03-18 (×13): 2.4 g
  Filled 2022-03-09 (×27): qty 1

## 2022-03-09 MED ORDER — ALTEPLASE 2 MG IJ SOLR
INTRAMUSCULAR | Status: AC
  Administered 2022-03-09: 2 mg
  Filled 2022-03-09: qty 2

## 2022-03-09 NOTE — Progress Notes (Signed)
Pre HD RN assessment 

## 2022-03-09 NOTE — Progress Notes (Signed)
Hd tx of 3.5hrs completed. 75L total vol processed. No complications noted. Report given to nallely sosa calderon, rn Total uf removed: 1568ml Post hd v/s: 98.7 140/84(97) 90 27 94%

## 2022-03-09 NOTE — Progress Notes (Signed)
[facility]  PROGRESS NOTE   Subjective:   Patient appears to track but not following commands consistently. Remains critically ill. Remains on heparin drip FiO2 50%, PEEP 15 Tube feeds at 70 cc/h  Objective:  Vital signs: Blood pressure 132/74, pulse 90, temperature 99.1 F (37.3 C), temperature source Axillary, resp. rate (!) 33, height 5\' 9"  (1.753 m), weight (!) 204 kg, SpO2 95 %.  Intake/Output Summary (Last 24 hours) at 03/09/2022 1320 Last data filed at 03/09/2022 1152 Gross per 24 hour  Intake 1934.74 ml  Output 2950 ml  Net -1015.26 ml    Filed Weights   03/06/22 0500  Weight: (!) 204 kg     Physical Exam: General:  No acute distress, resting comfortably  Head:  Normocephalic, atraumatic. Moist oral mucosal membranes  Eyes:  Anicteric  Lungs:   Vent assisted, tracheostomy in place  Heart:  Regular on monitor  Abdomen:   Soft, nontender,  Extremities: 1+ peripheral edema.  Neurologic: Opens eyes to sound.  Not following any other commands.  Skin:  No lesions  Access: Right IJ temp cath placed on 08/31/92    Basic Metabolic Panel: Recent Labs  Lab 03/03/22 0511 03/03/22 1536 03/04/22 0440 03/05/22 0419 03/06/22 0740 03/07/22 0417 03/08/22 0517 03/09/22 0642  NA 131*   < > 133* 134* 134* 134* 136 139  K 5.3*   < > 4.1 4.8 5.2* 6.1* 4.8 5.3*  CL 94*   < > 96* 97* 98 99 98 102  CO2 22   < > 25 23 24 22 26 25   GLUCOSE 249*   < > 167* 141* 143* 142* 123* 140*  BUN 98*   < > 91* 118* 97* 116* 97* 101*  CREATININE 4.78*   < > 4.23* 5.73* 5.11* 6.08* 4.99* 5.26*  CALCIUM 7.2*   < > 7.3* 7.4* 7.5* 7.5* 7.6* 8.0*  MG 2.9*  --  2.5*  --   --   --  2.7* 2.8*  PHOS 10.8*  --  9.5* >30.0* 10.3* 11.6* 10.0* 11.7*   < > = values in this interval not displayed.     CBC: Recent Labs  Lab 03/05/22 0419 03/06/22 0740 03/07/22 0417 03/08/22 0517 03/09/22 0642  WBC 10.8* 9.8 8.6 9.2 7.9  NEUTROABS 8.6* 7.6 6.5 6.8 5.7  HGB 8.1* 8.3* 8.7* 8.5*  8.4*  HCT 27.4* 27.3* 28.2* 28.2* 28.7*  MCV 88.7 86.4 86.0 87.6 89.1  PLT 229 249 211 249 224      Urinalysis: No results for input(s): "COLORURINE", "LABSPEC", "PHURINE", "GLUCOSEU", "HGBUR", "BILIRUBINUR", "KETONESUR", "PROTEINUR", "UROBILINOGEN", "NITRITE", "LEUKOCYTESUR" in the last 72 hours.  Invalid input(s): "APPERANCEUR"     Imaging: No results found.   Medications:    sodium chloride 250 mL (03/03/22 2253)   anticoagulant sodium citrate     anticoagulant sodium citrate     feeding supplement (VITAL 1.5 CAL) 70 mL/hr at 03/09/22 0600   heparin 2,800 Units/hr (03/09/22 1049)    amoxicillin-clavulanate  1 tablet Per Tube BID   carvedilol  3.125 mg Per Tube BID WC   Chlorhexidine Gluconate Cloth  6 each Topical Q0600   famotidine  20 mg Per Tube QHS   feeding supplement (PROSource TF)  90 mL Per Tube TID   fiber supplement (BANATROL TF)  60 mL Per Tube BID   free water  30 mL Per Tube Q4H   insulin aspart  0-20 Units Subcutaneous Q4H   insulin aspart  4 Units Subcutaneous Q4H  insulin detemir  22 Units Subcutaneous BID   ipratropium-albuterol  3 mL Nebulization Q6H   linezolid  600 mg Per Tube Q12H   multivitamin  1 tablet Per Tube QHS   mupirocin ointment   Nasal BID   mouth rinse  15 mL Mouth Rinse Q2H    Assessment/ Plan:     46 y.o. male with medical problems of poorly controlled diabetes, hypertension, systolic CHF, obstructive sleep apnea, obesity    admitted on 02/11/2022 for  Principal Problem:   Acute respiratory failure with hypoxia and hypercarbia (HCC) Active Problems:   Acute on chronic systolic CHF (congestive heart failure) (HCC)   Hypertensive urgency   Type 2 diabetes mellitus with peripheral neuropathy (HCC)   Acute heart failure with preserved ejection fraction (HCC)   Renal failure (ARF), acute on chronic ([place])   Hospital-acquired pneumonia   ARDS (adult respiratory distress syndrome) (HCC)   Pressure injury of skin    # Acute  kidney injury: Patient with worsening renal insufficiency.  Acute kidney injury is most likely secondary to acute tubular necrosis possibly due to hypotension, fever and sepsis.  Patient became oligoanuric with metabolic acidosis and hyperkalemia.   -Requiring hemodialysis support.  -With worsening BUN and increasing potassium and phosphorus, recommend hemodialysis today.  Orders placed and nursing staff notified.  # Hyperphosphatemia - will add 1renverla powder as phosphorus binders to the tube feeds.  # Hyperkalemia:  -Potassium increased again today.  We will dialyze patient today.  # Congestive heart failure/volume overload: Continue ultrafiltration efforts with dialysis.   #4 Vent dependent respiratory failure: Now with tracheostomy.  Breathing comfortably at the moment.   #5 Sepsis: Management as per ICU and ID teams. - currently on linezolid and Augmentin.      LOS: [facility] 8/20/20231:20 PM

## 2022-03-09 NOTE — Progress Notes (Signed)
Pt catheter not functioning for HD. High AP and VP. Reversed and straight attempted. Sherlyn Hay, NP and H. Candiss Norse, MD notified. Ordered for 1.5 hour Cathflo dwell. Primary ICU RN present and aware. Pt vss, safety maintained.

## 2022-03-09 NOTE — Progress Notes (Signed)
[facility] for IV Heparin  Indication: RUE DVT associated with PICC  Patient Measurements: Height: 5\' 9"  (175.3 cm) Weight:  (bed scale not working) IBW/kg (Calculated) : 70.7 Heparin Dosing Weight: 123.4 kg   Labs: Recent Labs    03/06/22 0740 03/06/22 1342 03/07/22 0417 03/07/22 1247 03/07/22 2118 03/08/22 0517 03/09/22 0642  HGB 8.3*  --  8.7*  --   --  8.5* 8.4*  HCT 27.3*  --  28.2*  --   --  28.2* 28.7*  PLT 249  --  211  --   --  249 224  HEPARINUNFRC  --    < > 0.22*   < > 0.40 0.44 0.46  CREATININE 5.11*  --  6.08*  --   --  4.99*  --    < > = values in this interval not displayed.   Estimated Creatinine Clearance: 33.1 mL/min (A) (by C-G formula based on SCr of 4.99 mg/dL (H)).  Medical History: Past Medical History:  Diagnosis Date   Diabetes mellitus without complication ([place])    Hypertension    Pancreatitis    Assessment: Patient is a 46 y/o M with medical history including DM, HTN, pancreatitis, tobacco use disorder, systolic CHF, OSA, DM c/b diabetic neuropathy, lumbar radiculopathy, morbid obesity who is admitted with acute respiratory failure in setting of acute CHF, hypertensive urgency, and pneumonia. Patient with prolonged admission on ventilator complicated by acute renal failure. Upper extremity doppler noted "non-occlusive/nearly occlusive thrombus in one of paired right upper arm brachial veins containing the indwelling PICC line and occlusive thrombus in the right axillary vein". Pharmacy consulted to initiate and manage IV heparin for RUE DVT.  Patient is a hard stick and PICC has since been removed  Goal of Therapy:  Heparin level 0.3-0.7 units/ml Monitor platelets by anticoagulation protocol: Yes   Plan:  8/20:  HL @ 0642 = 0.46, therapeutic X 4 Will continue pt on current rate and recheck HL on 8/21 with AM labs.   Aluel Schwarz D 03/09/2022,7:12 AM

## 2022-03-09 NOTE — Progress Notes (Signed)
Pt Cathflo dwell from 1600 to 1730.

## 2022-03-09 NOTE — Progress Notes (Signed)
HD report given to Darla Lesches, RN. HD treatment can be started at 1730 after 1.5 hours of Cathflo dwell.

## 2022-03-09 NOTE — Consult Note (Signed)
PHARMACY CONSULT NOTE  Pharmacy Consult for Electrolyte Monitoring and Replacement   Recent Labs: Potassium (mmol/L)  Date Value  03/09/2022 5.3 (H)   Magnesium (mg/dL)  Date Value  03/09/2022 2.8 (H)   Calcium (mg/dL)  Date Value  03/09/2022 8.0 (L)   Albumin (g/dL)  Date Value  03/01/2022 1.9 (L)   Phosphorus (mg/dL)  Date Value  03/09/2022 11.7 (H)   Sodium (mmol/L)  Date Value  03/09/2022 139   Assessment: Patient is a 46 y/o M with medical history including DM, HTN, pancreatitis, tobacco use disorder, systolic CHF, OSA, DM c/b diabetic neuropathy, lumbar radiculopathy, morbid obesity who is admitted with acute respiratory failure in setting of acute CHF and hypertensive urgency. Patient is currently intubated, sedated, and on mechanical ventilation in the ICU. Pharmacy consulted to assist with electrolyte monitoring and replacement as indicated.  Nutrition: Tube feeds at 70 mL/hr (~ 1.68 L/day) + free water 30 mL q4h (180 mL/day)   Nephrology consulted for renal dysfunction and to assist with hypernatremia management. HD started 8/12  Goal of Therapy:  Electrolytes within normal limits  Plan:  --Hypernatremia resolved, fluid management per PCCM and nephrology --Magnesium & phosphorous elevated in setting of renal dysfunction, nephrology and PCCM following. Next dialysis session planned for 8/21 --Follow-up electrolytes with AM labs tomorrow  Pearla Dubonnet  03/09/2022 8:34 AM

## 2022-03-10 ENCOUNTER — Ambulatory Visit: Payer: Medicaid Other | Admitting: Family

## 2022-03-10 DIAGNOSIS — J9601 Acute respiratory failure with hypoxia: Secondary | ICD-10-CM | POA: Diagnosis not present

## 2022-03-10 DIAGNOSIS — J9602 Acute respiratory failure with hypercapnia: Secondary | ICD-10-CM | POA: Diagnosis not present

## 2022-03-10 DIAGNOSIS — J15212 Pneumonia due to Methicillin resistant Staphylococcus aureus: Secondary | ICD-10-CM | POA: Diagnosis not present

## 2022-03-10 LAB — CBC WITH DIFFERENTIAL/PLATELET
Abs Immature Granulocytes: 0.03 10*3/uL (ref 0.00–0.07)
Basophils Absolute: 0 10*3/uL (ref 0.0–0.1)
Basophils Relative: 0 %
Eosinophils Absolute: 0.5 10*3/uL (ref 0.0–0.5)
Eosinophils Relative: 6 %
HCT: 26.6 % — ABNORMAL LOW (ref 39.0–52.0)
Hemoglobin: 7.9 g/dL — ABNORMAL LOW (ref 13.0–17.0)
Immature Granulocytes: 0 %
Lymphocytes Relative: 10 %
Lymphs Abs: 0.8 10*3/uL (ref 0.7–4.0)
MCH: 27 pg (ref 26.0–34.0)
MCHC: 29.7 g/dL — ABNORMAL LOW (ref 30.0–36.0)
MCV: 90.8 fL (ref 80.0–100.0)
Monocytes Absolute: 0.6 10*3/uL (ref 0.1–1.0)
Monocytes Relative: 8 %
Neutro Abs: 6.1 10*3/uL (ref 1.7–7.7)
Neutrophils Relative %: 76 %
Platelets: 206 10*3/uL (ref 150–400)
RBC: 2.93 MIL/uL — ABNORMAL LOW (ref 4.22–5.81)
RDW: 15.9 % — ABNORMAL HIGH (ref 11.5–15.5)
WBC: 8.1 10*3/uL (ref 4.0–10.5)
nRBC: 0 % (ref 0.0–0.2)

## 2022-03-10 LAB — BASIC METABOLIC PANEL
Anion gap: 11 (ref 5–15)
BUN: 79 mg/dL — ABNORMAL HIGH (ref 6–20)
CO2: 28 mmol/L (ref 22–32)
Calcium: 7.7 mg/dL — ABNORMAL LOW (ref 8.9–10.3)
Chloride: 99 mmol/L (ref 98–111)
Creatinine, Ser: 3.51 mg/dL — ABNORMAL HIGH (ref 0.61–1.24)
GFR, Estimated: 21 mL/min — ABNORMAL LOW (ref >60)
Glucose, Bld: 115 mg/dL — ABNORMAL HIGH (ref 70–99)
Potassium: 4.2 mmol/L (ref 3.5–5.1)
Sodium: 138 mmol/L (ref 135–145)

## 2022-03-10 LAB — GLUCOSE, CAPILLARY
Glucose-Capillary: 108 mg/dL — ABNORMAL HIGH (ref 70–99)
Glucose-Capillary: 110 mg/dL — ABNORMAL HIGH (ref 70–99)
Glucose-Capillary: 110 mg/dL — ABNORMAL HIGH (ref 70–99)
Glucose-Capillary: 116 mg/dL — ABNORMAL HIGH (ref 70–99)
Glucose-Capillary: 117 mg/dL — ABNORMAL HIGH (ref 70–99)
Glucose-Capillary: 135 mg/dL — ABNORMAL HIGH (ref 70–99)

## 2022-03-10 LAB — MAGNESIUM: Magnesium: 2.2 mg/dL (ref 1.7–2.4)

## 2022-03-10 LAB — PHOSPHORUS: Phosphorus: 8.3 mg/dL — ABNORMAL HIGH (ref 2.5–4.6)

## 2022-03-10 LAB — C-REACTIVE PROTEIN: CRP: 1.7 mg/dL — ABNORMAL HIGH (ref <1.0)

## 2022-03-10 LAB — HEPARIN LEVEL (UNFRACTIONATED): Heparin Unfractionated: 0.48 IU/mL (ref 0.30–0.70)

## 2022-03-10 MED ORDER — JUVEN PO PACK
1.0000 | PACK | Freq: Two times a day (BID) | ORAL | Status: DC
  Administered 2022-03-10 – 2022-03-11 (×2): 1

## 2022-03-10 NOTE — Progress Notes (Signed)
ANTICOAGULATION CONSULT NOTE  Pharmacy Consult for IV Heparin  Indication: RUE DVT associated with PICC  Patient Measurements: Height: 5\' 9"  (175.3 cm) Weight:  (bed scale broken) IBW/kg (Calculated) : 70.7 Heparin Dosing Weight: 123.4 kg   Labs: Recent Labs    03/08/22 0517 03/09/22 0642 03/10/22 0532  HGB 8.5* 8.4* 7.9*  HCT 28.2* 28.7* 26.6*  PLT 249 224 206  HEPARINUNFRC 0.44 0.46 0.48  CREATININE 4.99* 5.26* 3.51*   Estimated Creatinine Clearance: 47.1 mL/min (A) (by C-G formula based on SCr of 3.51 mg/dL (H)).  Medical History: Past Medical History:  Diagnosis Date   Diabetes mellitus without complication ([place])    Hypertension    Pancreatitis    Assessment: Patient is a 46 y/o M with medical history including DM, HTN, pancreatitis, tobacco use disorder, systolic CHF, OSA, DM c/b diabetic neuropathy, lumbar radiculopathy, morbid obesity who is admitted with acute respiratory failure in setting of acute CHF, hypertensive urgency, and pneumonia. Patient with prolonged admission on ventilator complicated by acute renal failure. Upper extremity doppler noted "non-occlusive/nearly occlusive thrombus in one of paired right upper arm brachial veins containing the indwelling PICC line and occlusive thrombus in the right axillary vein". Pharmacy consulted to initiate and manage IV heparin for RUE DVT.  Patient is a hard stick and PICC has since been removed  Goal of Therapy:  Heparin level 0.3-0.7 units/ml Monitor platelets by anticoagulation protocol: Yes   Plan:  8/21:  HL @ 0532 = 0.48, therapeutic X 5 Will continue pt on current rate and recheck HL on 8/22 with AM labs.   Samuel Chavez D 03/10/2022,6:55 AM

## 2022-03-10 NOTE — Progress Notes (Signed)
Nutrition Follow Up Note   DOCUMENTATION CODES:   Morbid obesity  INTERVENTION:   Continue Vital 1.5 $RemoveB'@70ml'VgNZCbHO$ /hr + ProSource TF 26ml TID via tube  Free water flushes 18ml q4 hours   Regimen provides 2760kcal/day, 180g/day protein and 145ml/day of free water   Rena-vit daily via tube   Juven Fruit Punch BID, each serving provides 95kcal and 2.5g of protein (amino acids glutamine and arginine)  Banatrol TF BID via tube   NUTRITION DIAGNOSIS:   Inadequate oral intake related to inability to eat (pt sedated and ventilated) as evidenced by NPO status.  GOAL:   Provide needs based on ASPEN/SCCM guidelines -met   MONITOR:   Vent status, Labs, Weight trends, TF tolerance, Skin, I & O's  ASSESSMENT:   46 y/o male with h/o COPD, CHF, HTN, DM and OSA who is admitted with CHF exacerbation, CAP and severe ARDS.  Pt s/p tracheostomy and NGT placement 8/8 Pt s/p new HD 8/12  Pt remains sedated and ventilated via trach. Pt tolerating tube feeds at goal rate via NGT. Plan is for IR G-tube placement pending CT abdomen. Electrolytes improved with continued HD. Pt with new wound; RD will add Juven. Pt with diarrhea; will add Banatrol. Bowel regimen discontinued. Per chart, pt has remained weight stable for the past two weeks. Pt remains up ~73lbs from his UBW. Pt +14.8(L) on his I & Os.   Medications reviewed and include: pepcid, heparin, insulin, rena-vit, renvela, unasyn, linezolid, heparin   Labs reviewed: K 4.2 wnl, BUN 79(H), creat 3.51(H), P 8.3(H), Mg 2.2 wnl Hgb 7.9(L), Hct 26.6(L) Cbgs- 116, 117, 110 x 24 hrs  Patient is currently intubated on ventilator support MV: 12.3 L/min Temp (24hrs), Avg:98.8 F (37.1 C), Min:98.5 F (36.9 C), Max:99.6 F (37.6 C)  Propofol: none   MAP- >61mmHg   UOP- 1842ml   Nutrition Focused Physical Exam:   Flowsheet Row Most Recent Value  Orbital Region No depletion  Upper Arm Region No depletion  Thoracic and Lumbar Region No  depletion  Buccal Region No depletion  Temple Region No depletion  Clavicle Bone Region Mild depletion  Clavicle and Acromion Bone Region Mild depletion  Scapular Bone Region No depletion  Dorsal Hand No depletion  Patellar Region No depletion  Anterior Thigh Region No depletion  Posterior Calf Region No depletion  Edema (RD Assessment) Moderate  Hair Reviewed  Eyes Reviewed  Mouth Reviewed  Skin Reviewed  Nails Reviewed   Diet Order:   Diet Order             Diet NPO time specified  Diet effective midnight                  EDUCATION NEEDS:   Not appropriate for education at this time  Skin:  Skin Assessment: Reviewed RN Assessment (Pressure Injury: right ischium)  Last BM:  8/21- type 6  Height:   Ht Readings from Last 1 Encounters:  02/28/22 $RemoveB'5\' 9"'iluSNFwK$  (1.753 m)    Weight:   Wt Readings from Last 1 Encounters:  03/12/18 (!) 154.2 kg    Ideal Body Weight:  72.7 kg  BMI:  Body mass index is 66.41 kg/m.  Estimated Nutritional Needs:   Kcal:  2700-3000kcal/day  Protein:  182g/day protein  Fluid:  2.0L/day  Koleen Distance MS, RD, LDN Please refer to [facility] for RD and/or RD on-call/weekend/after hours pager

## 2022-03-10 NOTE — Consult Note (Signed)
[facility] Nurse Consult Note: Reason for Consult: worsening of "right buttock excoriation".  Wound type: Pressure Injury: right ischium  Pressure Injury POA: No Measurement:see nursing flow sheets Wound bed:100% pink, granulation tissue Drainage (amount, consistency, odor) minimal, no odor Periwound:intact Dressing procedure/placement/frequency: Hydrogel (amorphous saline gel) to the wound bed, top with dry dressing and change daily Low air loss mattress in place while in the ICU, however will add orders for same if patient transfers to floor.  [facility] Nurse team will follow with you and see patient within 10 days for wound assessments.  Please notify [facility] nurses of any acute changes in the wounds or any new areas of concern [place] MSN, [place], [place], [place]

## 2022-03-10 NOTE — Progress Notes (Signed)
   Date of Admission:  02/11/2022     ID: Samuel Chavez is a 46 y.o. male Principal Problem:   Acute respiratory failure with hypoxia and hypercarbia (HCC) Active Problems:   Acute on chronic systolic CHF (congestive heart failure) (HCC)   Hypertensive urgency   Type 2 diabetes mellitus with peripheral neuropathy (HCC)   Acute heart failure with preserved ejection fraction (HCC)   Renal failure (ARF), acute on chronic (HCC)   Hospital-acquired pneumonia   ARDS (adult respiratory distress syndrome) (HCC)   Pressure injury of skin    Subjective None available   Medications:   carvedilol  3.125 mg Per Tube BID WC   Chlorhexidine Gluconate Cloth  6 each Topical Q0600   famotidine  20 mg Per Tube QHS   feeding supplement (PROSource TF)  90 mL Per Tube TID   fiber supplement (BANATROL TF)  60 mL Per Tube BID   free water  30 mL Per Tube Q4H   insulin aspart  0-20 Units Subcutaneous Q4H   insulin aspart  4 Units Subcutaneous Q4H   insulin detemir  22 Units Subcutaneous BID   ipratropium-albuterol  3 mL Nebulization Q6H   multivitamin  1 tablet Per Tube QHS   mupirocin ointment   Nasal BID   mouth rinse  15 mL Mouth Rinse Q2H   sevelamer carbonate  2.4 g Per Tube TID WC    Objective: Vital signs in last 24 hours: Temp:  [98.5 F (36.9 C)-99.6 F (37.6 C)] 99.6 F (37.6 C) (08/21 0800) Pulse Rate:  [72-97] 86 (08/21 1000) Resp:  [17-33] 21 (08/21 1000) BP: (120-150)/(70-88) 120/74 (08/21 1000) SpO2:  [91 %-96 %] 94 % (08/21 1000) FiO2 (%):  [50 %] 50 % (08/21 0835)    PHYSICAL EXAM:  General: awake, nods yes no to questions Morbid obestiy Opens eys spontaneously but does not track Tracheostomy Lungs: b/l airentry Heart: s1s2 Abdomen: Soft, non-tender,not distended. Bowel sounds normal. No masses Extremities: severe edema of arms and legs Skin: No rashes or lesions. Or bruising Lymph: Cervical, supraclavicular normal. Neurologic: cannot be assessed Lab  Results Recent Labs    03/09/22 0642 03/10/22 0532  WBC 7.9 8.1  HGB 8.4* 7.9*  HCT 28.7* 26.6*  NA 139 138  K 5.3* 4.2  CL 102 99  CO2 25 28  BUN 101* 79*  CREATININE 5.26* 3.51*    C-Reactive Protein Recent Labs    03/09/22 0642 03/10/22 0532  CRP 2.4* 1.7*    Microbiology: Tracheal aspirate - culture-MRSA  Studies/Results: No results found.   Assessment/Plan: Acute  respiratory failure with hypercapnia, and hypoxia- intubated on admisison in the ED and now has tracheostomy Contributing factors, OSA, COPD, CHF, obesity hypoventilation syndrome MRI no acute finding   Fever - low grade now - post tracheostomy-  Leucocytosis has resolved D.D Pneumonia due to MRSA- treated-10 days of linezolid Tongue injury VS   sinusitis PICC in place- site is clean RESp viral PCR Neg  unasyn for prevotella sinusitis- treated  Recommend getting PEG, as longer dubhoff in place risk for flaring of sinustis  Recommend CT chest/ abd if fever persist    Pneumonia - Strep pneumo/hinfluenza in tracheal aspirate has been adequately treated.but now has MRSA- on linezolid If fever persist CT chest to look for empyema   Anemia ? __AKI- started dialysis     Anasarca improving   _Chronic systolic heart failure- cardiology following  Discussed the management with ICU team

## 2022-03-10 NOTE — Evaluation (Signed)
Physical Therapy Evaluation Patient Details Name: Samuel Chavez MRN: [id number] DOB: 25-Jun-1976 Today's Date: 03/10/2022  History of Present Illness  Pt is 7 YOM admitted for AKI, hyperkalemia, CHF, Resp failure, sepsis. PMH includes: DM, HTN, sCHF, OSA, obesity, ARDS Resp failure, sepsis.  Clinical Impression  Pt presents to PT in bed and agreeable to participate in therapy services. Pt's communication is limited d/t tracheostomy. Pt requires total assistance for all bed mobility tasks. Pt is able to participate in supine therapeutic exercises with assistance from therapy staff. Pt requires extended processing time for instructions. Difficult to determine pt's cognitive status and PLOF d/t communication limitations and lack of family present to provide information. Pt would benefit from skilled PT at SNF to address above deficits in functional mobility and strength to improve overall level of function.      Recommendations for follow up therapy are one component of a multi-disciplinary discharge planning process, led by the attending physician.  Recommendations may be updated based on patient status, additional functional criteria and insurance authorization.  Follow Up Recommendations Skilled nursing-short term rehab (<3 hours/day) Can patient physically be transported by private vehicle: No    Assistance Recommended at Discharge Frequent or constant Supervision/Assistance  Patient can return home with the following  Two people to help with walking and/or transfers;Two people to help with bathing/dressing/bathroom;Direct supervision/assist for medications management;Help with stairs or ramp for entrance;Assist for transportation;Assistance with feeding;Assistance with cooking/housework;Direct supervision/assist for financial management    Equipment Recommendations Other (comment) (TBD)  Recommendations for Other Services       Functional Status Assessment Patient has had a recent  decline in their functional status and demonstrates the ability to make significant improvements in function in a reasonable and predictable amount of time.     Precautions / Restrictions Precautions Precaution Comments: contact precautions Restrictions Weight Bearing Restrictions: No      Mobility  Bed Mobility Overal bed mobility: Needs Assistance   Rolling: Total assist, +2 for physical assistance, +2 for safety/equipment         General bed mobility comments: totalA for all bed mobility (attempted limited rolling L/R only secondary to bed rest orders still in place other than rolling every 2 hours. Pt profoundly weak and would require +3 or more assist for all bed mobility tasks) Patient Response: Cooperative  Transfers                   General transfer comment: unable to attempt d/t safety    Ambulation/Gait               General Gait Details: unable to attempt d/t safety  Stairs            Wheelchair Mobility    Modified Rankin (Stroke Patients Only)       Balance Overall balance assessment:  (unable to attempt d/t safety)                                           Pertinent Vitals/Pain Pain Assessment Facial Expression: Relaxed, neutral Body Movements: Absence of movements Muscle Tension: Relaxed Compliance with ventilator (intubated pts.): Tolerating ventilator or movement Vocalization (extubated pts.): N/A CPOT Total: 0 Pain Intervention(s): Monitored during session    Home Living                     Additional Comments: pt unable  to provide hx, no family present to assist    Prior Function                       Hand Dominance        Extremity/Trunk Assessment   Upper Extremity Assessment Upper Extremity Assessment: Generalized weakness    Lower Extremity Assessment Lower Extremity Assessment: Generalized weakness    Cervical / Trunk Assessment Cervical / Trunk Exceptions:  unable to assess  Communication   Communication: Tracheostomy  Cognition Arousal/Alertness: Awake/alert Behavior During Therapy: WFL for tasks assessed/performed Overall Cognitive Status: Difficult to assess                                          General Comments      Exercises Total Joint Exercises Ankle Circles/Pumps: AROM, Both, 10 reps (gentle resistance) Quad Sets: Strengthening, Both, 10 reps Heel Slides: AAROM, Both, 10 reps General Exercises - Upper Extremity Shoulder Flexion: AAROM, Both, 10 reps to a max of 30 deg Elbow Flexion: AAROM, Both, 10 reps Other Exercises Other Exercises: hip internal rotation AAROM, both, 10 reps Other Exercises: supine leg press w manual resistance, strength, both, 10 reps   Assessment/Plan    PT Assessment Patient needs continued PT services  PT Problem List Decreased strength;Decreased activity tolerance;Decreased mobility;Cardiopulmonary status limiting activity;Obesity       PT Treatment Interventions DME instruction;Therapeutic exercise;Gait training;Balance training;Stair training;Neuromuscular re-education;Functional mobility training;Therapeutic activities;Patient/family education    PT Goals (Current goals can be found in the Care Plan section)  Acute Rehab PT Goals PT Goal Formulation: Patient unable to participate in goal setting    Frequency Min 2X/week     Co-evaluation               AM-PAC PT "6 Clicks" Mobility  Outcome Measure Help needed turning from your back to your side while in a flat bed without using bedrails?: Total Help needed moving from lying on your back to sitting on the side of a flat bed without using bedrails?: Total Help needed moving to and from a bed to a chair (including a wheelchair)?: Total Help needed standing up from a chair using your arms (e.g., wheelchair or bedside chair)?: Total Help needed to walk in hospital room?: Total Help needed climbing 3-5 steps with a  railing? : Total 6 Click Score: 6    End of Session   Activity Tolerance: Patient tolerated treatment well Patient left: in bed;with call bell/phone within reach;with bed alarm set Nurse Communication: Mobility status PT Visit Diagnosis: Muscle weakness (generalized) (M62.81);Difficulty in walking, not elsewhere classified (R26.2)    Time: 3354-5625 PT Time Calculation (min) (ACUTE ONLY): 28 min   Charges:              Glenice Laine MPH, SPT 03/10/22, 5:17 PM

## 2022-03-10 NOTE — Progress Notes (Signed)
Daughter updated on phone by this RN. Phone offered to patient on speaker phone and his family members were able to speak to patient. Patient nodding head he understood and emotional during interaction. Patient and family reassured patient is being provided full scope of care.

## 2022-03-10 NOTE — Progress Notes (Signed)
[facility]  PROGRESS NOTE   Subjective:   Open his eyes to voice today. Remains critically ill. Remains on heparin drip FiO2 50%, PEEP 15 Tube feeds at 70 cc/h  Objective:  Vital signs: Blood pressure 120/72, pulse 88, temperature 99.6 F (37.6 C), temperature source Oral, resp. rate (!) 23, height 5\' 9"  (1.753 m), weight (!) 204 kg, SpO2 94 %.  Intake/Output Summary (Last 24 hours) at 03/10/2022 5462 Last data filed at 03/10/2022 0553 Gross per 24 hour  Intake 1841.26 ml  Output 2650 ml  Net -808.74 ml    Filed Weights     Physical Exam: General:  No acute distress, resting comfortably  Head:  Normocephalic, atraumatic. Moist oral mucosal membranes  Eyes:  Anicteric  Lungs:   Vent assisted, tracheostomy in place  Heart:  Regular on monitor  Abdomen:   Soft, nontender,  Extremities: 1+ peripheral edema.  Neurologic: Opens eyes to sound.  Not following any other commands.  Skin:  No lesions  Access: Right IJ temp cath placed on 01/20/49    Basic Metabolic Panel: Recent Labs  Lab 03/04/22 0440 03/05/22 0419 03/06/22 0740 03/07/22 0417 03/08/22 0517 03/09/22 0642 03/10/22 0532  NA 133*   < > 134* 134* 136 139 138  K 4.1   < > 5.2* 6.1* 4.8 5.3* 4.2  CL 96*   < > 98 99 98 102 99  CO2 25   < > 24 22 26 25 28   GLUCOSE 167*   < > 143* 142* 123* 140* 115*  BUN 91*   < > 97* 116* 97* 101* 79*  CREATININE 4.23*   < > 5.11* 6.08* 4.99* 5.26* 3.51*  CALCIUM 7.3*   < > 7.5* 7.5* 7.6* 8.0* 7.7*  MG 2.5*  --   --   --  2.7* 2.8* 2.2  PHOS 9.5*   < > 10.3* 11.6* 10.0* 11.7* 8.3*   < > = values in this interval not displayed.     CBC: Recent Labs  Lab 03/06/22 0740 03/07/22 0417 03/08/22 0517 03/09/22 0642 03/10/22 0532  WBC 9.8 8.6 9.2 7.9 8.1  NEUTROABS 7.6 6.5 6.8 5.7 6.1  HGB 8.3* 8.7* 8.5* 8.4* 7.9*  HCT 27.3* 28.2* 28.2* 28.7* 26.6*  MCV 86.4 86.0 87.6 89.1 90.8  PLT 249 211 249 224 206      Urinalysis: No results for input(s):  "COLORURINE", "LABSPEC", "PHURINE", "GLUCOSEU", "HGBUR", "BILIRUBINUR", "KETONESUR", "PROTEINUR", "UROBILINOGEN", "NITRITE", "LEUKOCYTESUR" in the last 72 hours.  Invalid input(s): "APPERANCEUR"     Imaging: No results found.   Medications:    sodium chloride 250 mL (03/03/22 2253)   anticoagulant sodium citrate     anticoagulant sodium citrate     feeding supplement (VITAL 1.5 CAL) 70 mL/hr at 03/10/22 0553   heparin 2,800 Units/hr (03/10/22 0603)    amoxicillin-clavulanate  1 tablet Per Tube BID   carvedilol  3.125 mg Per Tube BID WC   Chlorhexidine Gluconate Cloth  6 each Topical Q0600   famotidine  20 mg Per Tube QHS   feeding supplement (PROSource TF)  90 mL Per Tube TID   fiber supplement (BANATROL TF)  60 mL Per Tube BID   free water  30 mL Per Tube Q4H   insulin aspart  0-20 Units Subcutaneous Q4H   insulin aspart  4 Units Subcutaneous Q4H   insulin detemir  22 Units Subcutaneous BID   ipratropium-albuterol  3 mL Nebulization Q6H   multivitamin  1 tablet Per Tube  QHS   mupirocin ointment   Nasal BID   mouth rinse  15 mL Mouth Rinse Q2H   sevelamer carbonate  2.4 g Per Tube TID WC    Assessment/ Plan:     46 y.o. male with medical problems of poorly controlled diabetes, hypertension, systolic CHF, obstructive sleep apnea, obesity    admitted on 02/11/2022 for  Principal Problem:   Acute respiratory failure with hypoxia and hypercarbia (HCC) Active Problems:   Acute on chronic systolic CHF (congestive heart failure) (HCC)   Hypertensive urgency   Type 2 diabetes mellitus with peripheral neuropathy (HCC)   Acute heart failure with preserved ejection fraction (HCC)   Renal failure (ARF), acute on chronic (HCC)   Hospital-acquired pneumonia   ARDS (adult respiratory distress syndrome) (HCC)   Pressure injury of skin    # Acute kidney injury: Acute kidney injury is most likely secondary to acute tubular necrosis possibly due to hypotension, fever and sepsis.   Patient became oligoanuric with metabolic acidosis and hyperkalemia.   -Requiring hemodialysis support.  - UOP 1800 cc and 1500 cc removed with HD - Electrolytes and Volume status are acceptable No acute indication for Dialysis at present   # Hyperphosphatemia - continue renvela powder as phosphorus binders with the tube feeds.  # Hyperkalemia:  -Potassium improved after HD.  # Congestive heart failure/volume overload:  Continue ultrafiltration efforts with dialysis.   #4 Vent dependent respiratory failure:  Now with tracheostomy. Vent assisted   #5 Sepsis: Management as per ICU and ID teams. - completed linezolid and Augmentin.      LOS: [facility] 8/21/20238:32 AM

## 2022-03-10 NOTE — Progress Notes (Signed)
NAME:  Samuel Chavez, MRN:  [id number], DOB:  [birth year], LOS: 70 ADMISSION DATE:  02/11/2022  History of Present Illness:  47 y.o male with significant PMH as below who presented to the ED with chief complaints of SOB and worsening bilateral lower extremities edema.   ED Course: In the emergency department, the temperature was 37.3C, the heart rate 99 beats/minute, the blood pressure 187/102 mm Hg, the respiratory rate 20 breaths/minute, and the oxygen saturation 89% on. He was noted to be drowsy but still protecting his airway and responding appropriately.   Pertinent  Medical History   Past Medical History:  Diagnosis Date   Diabetes mellitus without complication ([place])    Hypertension    Pancreatitis     Significant Hospital Events: Including procedures, antibiotic start and stop dates in addition to other pertinent events   7/25: Admitted to hospitalist service w/acute on chronic hypoxic hypercapnic resp. failure requiring BiPAP.Failed BiPAP and intubated. PCCM consulted 7/26: INTUBATED, difficlut to sedate, started [place] 7/27: severe hypoxia, PEEP increased to 15 but decreased back to 10 7/28: Persistent severe hypoxia, ventilator changes made 7/29: Persistent hypoxia, recruitment maneuvers instituted, ventilator changes made, empiric heparin as patient cannot be scanned query PE -02/17/22- patient is severely critically ill with bilateral multifocal pneumonia on sedation and paralysis with mechanical ventilation maximal settings.  He is at cusp of death, we met with daughter today and discussed severity of illness. Unable to perform CT due to severity of critical illness. Met with previous PCCM doc and discussed medical plan.  Patient had recruitment maneuvers last few days without improvement, on heparin gtt for possible PE.  02/18/22- patient continues to require maximal settings on ventilator despite good UOP.  He destaturated to <50% spO2 on MV today required BGV.    02/19/22- patient continues to require 100% FiO2 on maximal setting with high PEEP ladder for possible ARDS.  He was diuresed and on steroids with development of AKI.  His CXR had slight interval improvement on right. We discussed case with vascular surgery regarding possible empiric tPA for PE.  02/20/22- patient was unable to get CT today due to continued severe hypoxemia.  Daughter and other family members at bedside we reviewed case and medical plan.  There seems to be some confusion from family as they had asked me to wake patient up and take off ventilator even though I had explained multiple times that he is at very high risk for death.  They laughed at my comments and seemed to think it was not true. We may still be able to do bronchoscopy but its high risk.  We have reduced IV infusions by switching some medications to OGT route.  02/21/22- patient is weaned to 60%FiO2.  Plan for possible bronch if patient is tolerating.  Family at bedside.  If able to we will obtain more imaging and VQ scan to rule out PE.  02/22/22- Patient weaned to 50% on PRVC.  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 CBC stable , CMP with improved GFR. Monitoring Na, pharmacy and renal following for electrolytes/renal function.  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  Overall marked improvement on ventilator over past 48h. Met with daughter at bedside reviewed medical plan.  02/24/22-Vent requirements continue to slowly improve, currently 40% FiO2 and 14 PEEP.  AKI continues to slowly improve, remains hyperkalemic with potassium of 5.8.  Placed back on  Veltassa, Diurese x1.  Trach scheduled for tomorrow at 1:15 PM 02/25/22- Diurese with Lasix x1 dose. Trach scheduled for today. 02/26/22: Remains mechanically ventilated via newly placed tracheostomy will attempt to wean ventilator settings today.  Perform WUA  02/26/22: Pt remains mechanically  ventilated via tracheostomy vent settings: PEEP 14/FiO2 80% 02/27/22: Fevers, ID consulted. Repeating Tracheal aspirate and UA.  Zosyn started 02/28/22: Persistent fevers, Tracheal aspirate with Staph aureus, start Linezolid.  Worsening Creatinine, considering Lasix gtt given high vent requirements (100% FiO2, 14 peep).  D/c fentanyl gtt and decrease oral benzos/narcotics 03/01/22: worsening anuric renal failure. HD line placed and started on hemodialysis. 03/02/22: Vent support slowly improving. Plan for HD again today. 8/14-8/15: remains febrile, encephalopathic despite HD sessions 8/16 fever curve improved, clots noted in LUE 8/18: continued low grade fever (t max 100.6).  Receiving HD.  Slight improvement in encephalopathy 8/19 off pressors, off sedation, remains on vent  8/20: Afebrile, tolerating PSV 8/21: Remains afebrile, obtain CT Abdomen for IR to evaluate for PEG placement.  Tolerating PSV (15/5).  Consult PT for passive ROM  Consults:  PCCM Nephrology Vascular Surgery Palliative Care ENT Infectious Disease  Procedures:  7/25: Intubation 7/26: PICC triple-lumen, right basilic vein 7/31: Left radial arterial line  8/08: Size 8 proximal XLT [place] inserted 8/12: Right IJ HD catheter placed  Significant Diagnostic Tests:  7/25  Chest Xray:Chest x-ray showed cardiomegaly and pulmonary vascular congestion without overt pulmonary edema 7/25 Echocardiogram: difficult study, LVEF was estimated at 55 to 60%, dilated LV moderate LVH, grade 1 DD, enlargement of the right ventricle, left atrial size moderately dilated, right atrial size moderately dilated this is consistent with restrictive physiology (echo reading not congruous with prior echoes from [facility]) 7/29: Venous US BLE>>IMPRESSION: No lower extremity DVT. 7/30 Limited echo: LVEF 40 to 45% decreased LV function, no wall motion abnormalities, moderate dilation of the LV concentric LVH, right ventricular size moderately  enlarged, right atrial enlargement, this is more consistent with prior [facility] scans 8/4: CT Head>>No acute intracranial abnormality. Paranasal sinusitis. Bilateral complete opacification of the mastoid air cells and inner ears, as can be seen in the setting of otitis media. Correlate with symptoms. 8/4: CT Chest/Abdomen/Pelvis>>Extensive ground-glass, alveolar and patchy dense infiltrates in both lungs suggesting multifocal pneumonia. Part of this finding may suggest underlying pulmonary edema. Small bilateral pleural effusions are seen. Cardiomegaly. There is ectasia of the main pulmonary artery suggesting pulmonary arterial hypertension. There is no evidence of intestinal obstruction or pneumoperitoneum. There is no hydronephrosis. Appendix is not dilated. UB diverticula are seen in the colon without signs of focal diverticulitis. Severe degenerative changes are noted at L4-L5 level with significant interval worsening. Findings may be due to severe disc degeneration. If there is clinical suspicion for discitis or osteomyelitis, follow-up MRI may be considered 8/4: Lung V/Q>>Pulmonary embolism absent. 8/16: RUE brachial and axillary vein DVT associated with PICC line  Micro Data:  7/25: SARS-CoV-2 PCR> negative 7/25: MRSA PCR>> NEG 7/27: Strep pneumoniae Ag>> negative 7/27: Legionella Ag>> negative 7/27: Mycoplasma>> <770 7/27: Sputum>> Strep pneumo,Haemophilus influenzae 7/30 Sputum cult >> negative 8/4: Tracheal aspirate>>negative 8/9: Tracheal aspirate>>MRSA 8/10: RVP>>negative 8/10: Right nasal Wound culture>>STAPHYLOCOCCUS AUREUS, PROVOTELLA 8/10: MRSA PCR>> Positive 8/11: Blood culture 1/4>>Staphylococcus epidermidis MecA/C (suspect contaminant)  Antimicrobials:  Cefepime 7/27 >>7/31 Ceftriaxone 7/31>>8/4 Vancomycin 7/27 >> 7/29, restarted 7/30 (resumed due to fever spike on Maxipime) ? Resistant Strep pneumo >> 7/31 Unasyn 8/14>>8/17 Augmentin 8/17>>8/21 Linezolid  8/11>>8/20  Interim History / Subjective:  -No   significant events noted overnight -Has remained afebrile ~ obtain CT Abdomen to allow IR to assess for PEG placement -Remains on vent, tolerating PSV 15/5 -Slight improvement in encephalopathy ~ opens eyes to voice ~ still unable to follow commands ~ consult PT to start passive ROM  Objective   Blood pressure 120/74, pulse 86, temperature 99.6 F (37.6 C), temperature source Oral, resp. rate (!) 21, height 5' 9" (1.753 m), weight (!) 204 kg, SpO2 94 %.    Vent Mode: PSV;CPAP FiO2 (%):  [50 %] 50 % PEEP:  [5 cmH20] 5 cmH20 Pressure Support:  [15 cmH20] 15 cmH20 Plateau Pressure:  [21 cmH20-26 cmH20] 21 cmH20   Intake/Output Summary (Last 24 hours) at 03/10/2022 1050 Last data filed at 03/10/2022 0938 Gross per 24 hour  Intake 2246.23 ml  Output 2950 ml  Net -703.77 ml    Filed Weights    Examination: GENERAL:L Acute on chronically ill appearing male, awake, on vent, in NAD HENT: Atraumatic, normocephalic, neck supple, Tracheostomy dressing clean, dry and intact LUNGS: distant breath coarse sounds, on vent, even, nonlabored CV: RRR, s1s2, no M/R/G ABDOMEN: Obese, soft, nontender, no guarding or rebound tenderness, BS+ x4, liquid stool in bed MUSCULOSKELETAL: Improving marked edema, Ext warm NEURO: Eyes open to voice and tracks, blinks eyes to commands, grimaces to pain but does not withdraw to pain, no motor response, pupils PERRL   Resolved Hospital Problem list   Community-acquired pneumonia: Streptococcus pneumoniae and Haemophilus influenzae ~ TREATED MRSA HCAP ~ TREATED Provetella Sinusitis ~ TREATED Hypernatremia  Assessment & Plan:  Acute on chronic hypoxemic and hypercapneic respiratory failure now s/p tracheostomy PMHx: OSA/OHS -Full vent support, implement lung protective strategies -Plateau pressures less than 30 cm H20 -Wean FiO2 & PEEP as tolerated to maintain O2 sats >92% -Follow intermittent Chest X-ray &  ABG as needed -Spontaneous Breathing Trials when respiratory parameters met and mental status permits -Implement VAP Bundle -Prn Bronchodilators  MRSA HCAP- on linezolid x 7 days Provetella sinusitis- completed unasyn/augmentin Persistent low grade fever, suspect due to RUE DVT ~ RESOLVED -Monitor fever curve -Trend WBC's & Procalcitonin -Follow cultures as above -ID following, appreciate input ~ Completed course of ABX as above  Acute renal failure with hyperkalemia now on iHD -Monitor I&O's / urinary output -Follow BMP -Ensure adequate renal perfusion -Avoid nephrotoxic agents as able -Replace electrolytes as indicated -Nephrology following, appreciate input  DM2 with hyperglycemia- now on basal bolus -CBG's q4h; Target range of 140 to 180 -SSI & Semglee -Follow ICU Hypo/Hyperglycemia protocol  Ongoing severe toxic encephalopathy- suspect  ? Critical Illness Myopathy/polyneuropathy MRI brain/C spine okay 8/15.  Still uremic.  EEG 8/16 neg.  -Avoid sedating medications as able -Ammonia normal, TSH normal -Consult PT  PICC associated DVT- PICC removed -Heparin gtt -Once PEG completed, can transition to Eliquis  - IR PEG consult -Obtain CT Abdomen 8/21   Best Practice (right click and "Reselect all SmartList Selections" daily)   Diet/type: tubefeeds DVT prophylaxis: systemic heparin GI prophylaxis: PPI Lines: HD catheter Foley:  Out Code Status:  full code Last date of multidisciplinary goals of care discussion [03/10/22]  Long term prognosis guarded  Critical Care Time: 35 minutes  Jeremiah Keene, AGACNP-BC [facility] Prefer epic messenger for cross cover needs If after hours, please call E-link     

## 2022-03-10 NOTE — Consult Note (Signed)
PHARMACY CONSULT NOTE  Pharmacy Consult for Electrolyte Monitoring and Replacement   Recent Labs: Potassium (mmol/L)  Date Value  03/10/2022 4.2   Magnesium (mg/dL)  Date Value  03/10/2022 2.2   Calcium (mg/dL)  Date Value  03/10/2022 7.7 (L)   Albumin (g/dL)  Date Value  03/01/2022 1.9 (L)   Phosphorus (mg/dL)  Date Value  03/10/2022 8.3 (H)   Sodium (mmol/L)  Date Value  03/10/2022 138   Assessment: Patient is a 46 y/o M with medical history including DM, HTN, pancreatitis, tobacco use disorder, systolic CHF, OSA, DM c/b diabetic neuropathy, lumbar radiculopathy, morbid obesity who is admitted with acute respiratory failure in setting of acute CHF and hypertensive urgency. Patient is currently intubated, sedated, and on mechanical ventilation in the ICU. Pharmacy consulted to assist with electrolyte monitoring and replacement as indicated.  Nutrition: Tube feeds at 70 mL/hr (~ 1.68 L/day) + free water 30 mL q4h (180 mL/day)   Nephrology consulted for renal dysfunction and to assist with hypernatremia management. HD started 8/12  Goal of Therapy:  Electrolytes within normal limits  Plan:  --Hypernatremia resolved, fluid management per PCCM and nephrology --Magnesium & phosphorous elevated in setting of renal dysfunction, nephrology and PCCM following.  --Follow-up electrolytes with AM labs tomorrow  Dallie Piles  03/10/2022 6:57 AM

## 2022-03-10 NOTE — Progress Notes (Signed)
IR Consulted for PEG tube placement.  I reviewed the patient's anatomy on CT from 02/21/2022.  Given the distance of the stomach from the anterior abdominal wall and proximity of the transverse colon and liver, this patient is not a candidate for PEG tube placement by IR. Discussed findings with Dr. Mortimer Fries and NP Dewaine Conger.  Ollie, MD [phone]

## 2022-03-10 NOTE — Progress Notes (Signed)
Spoke with Samuel Chavez in [place] regarding patient's size and weight to make sure their equipment is adequate. Patient's scale on bed is broken. Estimate this patient weighs greater than 500 lbs. Patient measured side to side by this RN to total 64" wide. CT machine has circumference of 72"

## 2022-03-11 DIAGNOSIS — J9602 Acute respiratory failure with hypercapnia: Secondary | ICD-10-CM | POA: Diagnosis not present

## 2022-03-11 DIAGNOSIS — J9601 Acute respiratory failure with hypoxia: Secondary | ICD-10-CM | POA: Diagnosis not present

## 2022-03-11 LAB — GLUCOSE, CAPILLARY
Glucose-Capillary: 110 mg/dL — ABNORMAL HIGH (ref 70–99)
Glucose-Capillary: 110 mg/dL — ABNORMAL HIGH (ref 70–99)
Glucose-Capillary: 110 mg/dL — ABNORMAL HIGH (ref 70–99)
Glucose-Capillary: 117 mg/dL — ABNORMAL HIGH (ref 70–99)
Glucose-Capillary: 84 mg/dL (ref 70–99)
Glucose-Capillary: 86 mg/dL (ref 70–99)

## 2022-03-11 LAB — CBC WITH DIFFERENTIAL/PLATELET
Abs Immature Granulocytes: 0.03 10*3/uL (ref 0.00–0.07)
Basophils Absolute: 0 10*3/uL (ref 0.0–0.1)
Basophils Relative: 0 %
Eosinophils Absolute: 0.5 10*3/uL (ref 0.0–0.5)
Eosinophils Relative: 6 %
HCT: 27.5 % — ABNORMAL LOW (ref 39.0–52.0)
Hemoglobin: 8.1 g/dL — ABNORMAL LOW (ref 13.0–17.0)
Immature Granulocytes: 0 %
Lymphocytes Relative: 11 %
Lymphs Abs: 0.9 10*3/uL (ref 0.7–4.0)
MCH: 27 pg (ref 26.0–34.0)
MCHC: 29.5 g/dL — ABNORMAL LOW (ref 30.0–36.0)
MCV: 91.7 fL (ref 80.0–100.0)
Monocytes Absolute: 0.7 10*3/uL (ref 0.1–1.0)
Monocytes Relative: 9 %
Neutro Abs: 5.6 10*3/uL (ref 1.7–7.7)
Neutrophils Relative %: 74 %
Platelets: 197 10*3/uL (ref 150–400)
RBC: 3 MIL/uL — ABNORMAL LOW (ref 4.22–5.81)
RDW: 15.9 % — ABNORMAL HIGH (ref 11.5–15.5)
WBC: 7.7 10*3/uL (ref 4.0–10.5)
nRBC: 0 % (ref 0.0–0.2)

## 2022-03-11 LAB — RENAL FUNCTION PANEL
Albumin: 2.1 g/dL — ABNORMAL LOW (ref 3.5–5.0)
Anion gap: 10 (ref 5–15)
BUN: 89 mg/dL — ABNORMAL HIGH (ref 6–20)
CO2: 28 mmol/L (ref 22–32)
Calcium: 7.9 mg/dL — ABNORMAL LOW (ref 8.9–10.3)
Chloride: 102 mmol/L (ref 98–111)
Creatinine, Ser: 3.69 mg/dL — ABNORMAL HIGH (ref 0.61–1.24)
GFR, Estimated: 20 mL/min — ABNORMAL LOW (ref >60)
Glucose, Bld: 108 mg/dL — ABNORMAL HIGH (ref 70–99)
Phosphorus: 9.3 mg/dL — ABNORMAL HIGH (ref 2.5–4.6)
Potassium: 4.6 mmol/L (ref 3.5–5.1)
Sodium: 140 mmol/L (ref 135–145)

## 2022-03-11 LAB — C-REACTIVE PROTEIN: CRP: 1.3 mg/dL — ABNORMAL HIGH (ref <1.0)

## 2022-03-11 LAB — MAGNESIUM: Magnesium: 2.4 mg/dL (ref 1.7–2.4)

## 2022-03-11 LAB — HEPARIN LEVEL (UNFRACTIONATED): Heparin Unfractionated: 0.46 IU/mL (ref 0.30–0.70)

## 2022-03-11 LAB — PHOSPHORUS: Phosphorus: 9.2 mg/dL — ABNORMAL HIGH (ref 2.5–4.6)

## 2022-03-11 MED ORDER — FAMOTIDINE 40 MG/5ML PO SUSR
20.0000 mg | Freq: Every day | ORAL | Status: DC
  Administered 2022-03-11: 20 mg
  Filled 2022-03-11 (×2): qty 2.5

## 2022-03-11 NOTE — Progress Notes (Signed)
[facility]  PROGRESS NOTE   Subjective:   Open his eyes to voice today.  Able to follow simple commands.  On Remains critically ill. Remains on heparin drip FiO2 45%, PEEP 10 Tube feeds hold this morning.  Objective:  Vital signs: Blood pressure 135/68, pulse 79, temperature 98.4 F (36.9 C), temperature source Oral, resp. rate 18, height 5\' 9"  (1.753 m), weight (!) 204 kg, SpO2 93 %.  Intake/Output Summary (Last 24 hours) at 03/11/2022 1042 Last data filed at 03/11/2022 1000 Gross per 24 hour  Intake 1971.21 ml  Output 2850 ml  Net -878.79 ml    Filed Weights     Physical Exam: General:  No acute distress, resting comfortably  Head:  Normocephalic, atraumatic. Moist oral mucosal membranes  Eyes:  Anicteric  Lungs:   Vent assisted, tracheostomy in place  Heart:  Regular on monitor  Abdomen:   Soft, nontender,  Extremities: 1+ peripheral edema.  Neurologic: Opens eyes to sound.  Able to follow simple commands today.  Skin:  No lesions  Access: Right IJ temp cath placed on 7/62/26    Basic Metabolic Panel: Recent Labs  Lab 03/07/22 0417 03/08/22 0517 03/09/22 0642 03/10/22 0532 03/11/22 0757  NA 134* 136 139 138 140  K 6.1* 4.8 5.3* 4.2 4.6  CL 99 98 102 99 102  CO2 22 26 25 28 28   GLUCOSE 142* 123* 140* 115* 108*  BUN 116* 97* 101* 79* 89*  CREATININE 6.08* 4.99* 5.26* 3.51* 3.69*  CALCIUM 7.5* 7.6* 8.0* 7.7* 7.9*  MG  --  2.7* 2.8* 2.2 2.4  PHOS 11.6* 10.0* 11.7* 8.3* 9.3*  9.2*     CBC: Recent Labs  Lab 03/07/22 0417 03/08/22 0517 03/09/22 0642 03/10/22 0532 03/11/22 0757  WBC 8.6 9.2 7.9 8.1 7.7  NEUTROABS 6.5 6.8 5.7 6.1 5.6  HGB 8.7* 8.5* 8.4* 7.9* 8.1*  HCT 28.2* 28.2* 28.7* 26.6* 27.5*  MCV 86.0 87.6 89.1 90.8 91.7  PLT 211 249 224 206 197      Urinalysis: No results for input(s): "COLORURINE", "LABSPEC", "PHURINE", "GLUCOSEU", "HGBUR", "BILIRUBINUR", "KETONESUR", "PROTEINUR", "UROBILINOGEN", "NITRITE",  "LEUKOCYTESUR" in the last 72 hours.  Invalid input(s): "APPERANCEUR"     Imaging: No results found.   Medications:    sodium chloride 250 mL (03/03/22 2253)   anticoagulant sodium citrate     anticoagulant sodium citrate     feeding supplement (VITAL 1.5 CAL) 70 mL/hr at 03/11/22 1000   heparin 2,800 Units/hr (03/11/22 1000)    carvedilol  3.125 mg Per Tube BID WC   Chlorhexidine Gluconate Cloth  6 each Topical Q0600   famotidine  20 mg Per Tube QHS   feeding supplement (PROSource TF)  90 mL Per Tube TID   fiber supplement (BANATROL TF)  60 mL Per Tube BID   free water  30 mL Per Tube Q4H   insulin aspart  0-20 Units Subcutaneous Q4H   insulin aspart  4 Units Subcutaneous Q4H   insulin detemir  22 Units Subcutaneous BID   ipratropium-albuterol  3 mL Nebulization Q6H   multivitamin  1 tablet Per Tube QHS   mupirocin ointment   Nasal BID   nutrition supplement (JUVEN)  1 packet Per Tube BID BM   mouth rinse  15 mL Mouth Rinse Q2H   sevelamer carbonate  2.4 g Per Tube TID WC    Assessment/ Plan:     46 y.o. male with medical problems of poorly controlled diabetes, hypertension, systolic CHF, obstructive  sleep apnea, obesity    admitted on 02/11/2022 for  Principal Problem:   Acute respiratory failure with hypoxia and hypercarbia (HCC) Active Problems:   Acute on chronic systolic CHF (congestive heart failure) (HCC)   Hypertensive urgency   Type 2 diabetes mellitus with peripheral neuropathy (HCC)   Acute heart failure with preserved ejection fraction (HCC)   Renal failure (ARF), acute on chronic (HCC)   Hospital-acquired pneumonia   ARDS (adult respiratory distress syndrome) (HCC)   Pressure injury of skin    # Acute kidney injury: Acute kidney injury is most likely secondary to acute tubular necrosis possibly due to hypotension, fever and sepsis.  Patient became oligoanuric with metabolic acidosis and hyperkalemia.   -Requiring hemodialysis support.  - UOP 2200  cc -BUN and creatinine levels have increased today. -We will dialyze the patient again today.  # Hyperphosphatemia - continue renvela powder as phosphorus binders with the tube feeds. -Phosphorus levels remain elevated at 9.3 today.  Expected to improve some with binders and with hemodialysis.  # Hyperkalemia:  -Potassium improved after HD.  #Chronic systolic CHF with volume overload 2D echo February 16, 2022-LVEF 40 to 45%, mild concentric LVH moderately enlarged right ventricle, moderately dilated left atrium and right atrium.   #4  Acute hypoxic Vent dependent respiratory failure:  Now with tracheostomy. Vent assisted   #5 Sepsis: Management as per ICU and ID teams. - completed linezolid and Augmentin.      LOS: [facility] 8/22/202310:42 AM

## 2022-03-11 NOTE — Consult Note (Addendum)
Subjective:   CC: need for gastrostomy tube  HPI:  Samuel Chavez is a 46 y.o. male who was consulted by [facility] for issue above.  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   Past Medical History:  has a past medical history of Diabetes mellitus without complication ([place]), Hypertension, and Pancreatitis.  Past Surgical History:  Past Surgical History:  Procedure Laterality Date   CHOLECYSTECTOMY     TRACHEOSTOMY TUBE PLACEMENT N/A 02/25/2022   Procedure: TRACHEOSTOMY;  Surgeon: Clyde Canterbury, MD;  Location: [facility] ORS;  Service: ENT;  Laterality: N/A;    Family History: reviewed and not relevant to CC  Social History:  reports that he has been smoking cigarettes. He has been smoking an average of 1 pack per day. He has never used smokeless tobacco. He reports that he does not drink alcohol and does not use drugs.  Current Medications:  Prior to Admission medications   Medication Sig Start Date End Date Taking? Authorizing Provider  losartan (COZAAR) 100 MG tablet Take 100 mg by mouth daily. 02/01/22  Yes [provider]  OZEMPIC, 0.25 OR 0.5 MG/DOSE, 2 MG/3ML SOPN SMARTSIG:0.25 Milligram(s) SUB-Q Once a Week 12/09/21  Yes [provider]  torsemide (DEMADEX) 20 MG tablet Take 20 mg by mouth daily. 12/18/21  Yes [provider]  Aspirin-Salicylamide-Caffeine (BC HEADACHE POWDER PO) Take 1 packet by mouth as needed (for pain).    [provider]  cephALEXin (KEFLEX) 500 MG capsule Take 1 capsule (500 mg total) by mouth 4 (four) times daily. Patient not taking: Reported on 02/11/2022 12/16/18   Davonna Belling, MD  cyclobenzaprine (FLEXERIL) 10 MG tablet Take 0.5-1 tablets (5-10 mg total) by mouth 2 (two) times daily as needed for muscle spasms. Patient not taking: Reported on 02/11/2022 03/12/18   Margarita Mail, PA-C   gabapentin (NEURONTIN) 600 MG tablet Take 600 mg by mouth at bedtime. Patient not taking: Reported on 02/11/2022 10/13/21   [provider]  LABETALOL HCL PO Take by mouth. Patient not taking: Reported on 02/11/2022    [provider]  meloxicam (MOBIC) 15 MG tablet Take 1 tablet (15 mg total) by mouth daily. Take 1 daily with food. Patient not taking: Reported on 02/11/2022 03/12/18   Margarita Mail, PA-C  metFORMIN (GLUCOPHAGE) 500 MG tablet Take 500 mg by mouth 2 (two) times daily with a meal. Patient not taking: Reported on 02/11/2022    [provider]  METFORMIN HCL ER, MOD, PO Take by mouth. Patient not taking: Reported on 02/11/2022    [provider]  METOPROLOL SUCCINATE ER PO Take by mouth. Patient not taking: Reported on 02/11/2022    [provider]  oxyCODONE-acetaminophen (PERCOCET) 10-325 MG tablet Take 1 tablet by mouth See admin instructions. Take 1 tablet by mouth 4-5 times a day as needed for pain Patient not taking: Reported on 02/11/2022    [provider]  oxyCODONE-acetaminophen (PERCOCET/ROXICET) 5-325 MG tablet Take 1 tablet by mouth every 8 (eight) hours as needed for severe pain. Patient not taking: Reported on 02/11/2022 12/16/18   Davonna Belling, MD  predniSONE (DELTASONE) 20 MG tablet Take 2 tablets (40 mg total) by mouth daily. Patient not taking: Reported on 02/11/2022 04/04/15   Davonna Belling, MD    Allergies:  Allergies as of 02/11/2022 - Review Complete 02/11/2022  Allergen Reaction Noted   Hydrocodone Swelling 04/04/2015  ROS:  Unable to obtain secondary to patient status   Objective:     BP 135/68 (BP Location: Right Wrist)   Pulse 79   Temp 98.4 F (36.9 C) (Oral)   Resp 18   Ht 5\' 9"  (1.753 m)   Wt (!) 204 kg   SpO2 93%   BMI 66.41 kg/m   Constitutional :  no distress and trach in place, unable to answer questions  Lymphatics/Throat:  no asymmetry, masses, or scars  Respiratory:   clear to auscultation bilaterally  Cardiovascular:  regular rate and rhythm  Gastrointestinal: soft, non-tender; bowel sounds normal; no masses,  no organomegaly.   Musculoskeletal: Steady movement  Skin: Cool and moist, port site incisions from likely lap chole  Psychiatric: Normal affect, non-agitated, not confused       LABS:     Latest Ref Rng & Units 03/11/2022    7:57 AM 03/10/2022    5:32 AM 03/09/2022    6:42 AM  CMP  Glucose 70 - 99 mg/dL 108  115  140   BUN 6 - 20 mg/dL 89  79  101   Creatinine 0.61 - 1.24 mg/dL 3.69  3.51  5.26   Sodium 135 - 145 mmol/L 140  138  139   Potassium 3.5 - 5.1 mmol/L 4.6  4.2  5.3   Chloride 98 - 111 mmol/L 102  99  102   CO2 22 - 32 mmol/L 28  28  25    Calcium 8.9 - 10.3 mg/dL 7.9  7.7  8.0       Latest Ref Rng & Units 03/11/2022    7:57 AM 03/10/2022    5:32 AM 03/09/2022    6:42 AM  CBC  WBC 4.0 - 10.5 K/uL 7.7  8.1  7.9   Hemoglobin 13.0 - 17.0 g/dL 8.1  7.9  8.4   Hematocrit 39.0 - 52.0 % 27.5  26.6  28.7   Platelets 150 - 400 K/uL 197  206  224     RADS: CLINICAL DATA:  Sepsis   EXAM: CT CHEST, ABDOMEN AND PELVIS WITHOUT CONTRAST   TECHNIQUE: Multidetector CT imaging of the chest, abdomen and pelvis was performed following the standard protocol without IV contrast.   RADIATION DOSE REDUCTION: This exam was performed according to the departmental dose-optimization program which includes automated exposure control, adjustment of the mA and/or kV according to patient size and/or use of iterative reconstruction technique.   COMPARISON:  Previous studies including chest radiograph done earlier today, CT chest done on 06/04/2020   FINDINGS: CT CHEST FINDINGS   Cardiovascular: Heart is enlarged in size. There is ectasia of main pulmonary artery measuring 4.2 cm suggesting pulmonary arterial hypertension.   Mediastinum/Nodes: There are slightly enlarged lymph nodes in mediastinum largest in the precarinal region measuring  1.3 cm in short axis. Tip of endotracheal tube is at the level of aortic arch. Enteric tube is noted traversing the esophagus. Tip of right PICC line is seen in superior vena cava.   Lungs/Pleura: Patchy ground-glass and alveolar infiltrates are seen in both lungs. Dense infiltrates are seen in the posterior aspects of both lower lung fields with air bronchograms. Small pleural effusions are seen. There is no pneumothorax.   Musculoskeletal: No acute abnormalities are seen in the bony structures in the thorax.   CT ABDOMEN PELVIS FINDINGS   Hepatobiliary: No focal abnormalities are seen in liver. There is no dilation of bile ducts. Surgical clips are seen in gallbladder fossa.  Pancreas: No focal abnormalities are seen.   Spleen: Spleen measures 13.9 cm in maximum diameter.   Adrenals/Urinary Tract: Adrenals are unremarkable. There is no hydronephrosis. There are no renal or ureteral stones. Foley catheter is seen in the bladder. There is no significant wall thickening in the bladder.   Stomach/Bowel: Tip of NG tube is seen in the antrum of the stomach. There is contrast in the lumen of stomach and few small bowel loops. Small bowel loops are not dilated. The appendix is not dilated. There is no significant wall thickening in colon. There is no pericolic stranding. Few diverticula seen in the colon without signs of focal diverticulitis.   Vascular/Lymphatic: Scattered arterial calcifications are seen.   Reproductive: Coarse calcifications are seen prostate.   Other: There is no ascites or pneumoperitoneum.   Musculoskeletal: Severe degenerative changes are noted at the L4-L5 level in lumbar spine with significant interval worsening in comparison with the study of 01/28/2016.   IMPRESSION: Extensive ground-glass, alveolar and patchy dense infiltrates in both lungs suggesting multifocal pneumonia. Part of this finding may suggest underlying pulmonary edema. Small  bilateral pleural effusions are seen.   Cardiomegaly. There is ectasia of the main pulmonary artery suggesting pulmonary arterial hypertension.   There is no evidence of intestinal obstruction or pneumoperitoneum. There is no hydronephrosis. Appendix is not dilated.   UB diverticula are seen in the colon without signs of focal diverticulitis.   Severe degenerative changes are noted at L4-L5 level with significant interval worsening. Findings may be due to severe disc degeneration. If there is clinical suspicion for discitis or osteomyelitis, follow-up MRI may be considered.     Electronically Signed   By: Elmer Picker M.D.   On: 02/21/2022 16:17   Assessment:   Need for long term enteral feeds  Plan:   Reviewed CT imaging.  Due to body habitus and enlarged liver, IR guided placement and PEG will not be an option.  Will proceed with robotic assisted laparoscopic gastrostomy tube placement. Alternatives include continued dobhoff but has high risk of infection and aspiration, malfunction.  The risk of surgery include, but not limited to, bleeding, post-op infxn, malfunction. The risks of general anesthetic, if used, includes MI, CVA, sudden death or even reaction to anesthetic medications also discussed. Benefits include nutrition placement  Discussed with daughter who has been acting POA and she verbalized understanding and all questions were answered to the patient's satisfaction.  Tentatively scheduled for Friday.  Hold heparin drip 2hours prior to start time.  labs/images/medications/previous chart entries reviewed personally and relevant changes/updates noted above.

## 2022-03-11 NOTE — Consult Note (Signed)
PHARMACY CONSULT NOTE  Pharmacy Consult for Electrolyte Monitoring and Replacement   Recent Labs: Potassium (mmol/L)  Date Value  03/11/2022 4.6   Magnesium (mg/dL)  Date Value  03/11/2022 2.4   Calcium (mg/dL)  Date Value  03/11/2022 7.9 (L)   Albumin (g/dL)  Date Value  03/11/2022 2.1 (L)   Phosphorus (mg/dL)  Date Value  03/11/2022 9.2 (H)  03/11/2022 9.3 (H)   Sodium (mmol/L)  Date Value  03/11/2022 140   Assessment: Patient is a 46 y/o M with medical history including DM, HTN, pancreatitis, tobacco use disorder, systolic CHF, OSA, DM c/b diabetic neuropathy, lumbar radiculopathy, morbid obesity who is admitted with acute respiratory failure in setting of acute CHF and hypertensive urgency. Patient is currently intubated, sedated, and on mechanical ventilation in the ICU. Pharmacy consulted to assist with electrolyte monitoring and replacement as indicated.  Nutrition: Tube feeds at 70 mL/hr (~ 1.68 L/day) + free water 30 mL q4h (180 mL/day)   Nephrology consulted for renal dysfunction and to assist with hypernatremia management. HD started 8/12  Goal of Therapy:  Electrolytes within normal limits  Plan:  --Hypernatremia resolved, fluid management per PCCM and nephrology --Magnesium & phosphorous elevated in setting of renal dysfunction, nephrology and PCCM following.  --Follow-up electrolytes with AM labs tomorrow  Samuel Chavez  03/11/2022 1:57 PM

## 2022-03-11 NOTE — Progress Notes (Signed)
PT did 3 hrs of tx, tolerated well UF = 0 No issues Cvc dressing changed, heplocked and capped Report given to floor RN  VS 139/73 Temp 99.5 Hr 71 Rr 18

## 2022-03-11 NOTE — H&P (View-Only) (Signed)
Subjective:   CC: need for gastrostomy tube  HPI:  Samuel Chavez is a 46 y.o. male who was consulted by [facility] for issue above.  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   Past Medical History:  has a past medical history of Diabetes mellitus without complication ([place]), Hypertension, and Pancreatitis.  Past Surgical History:  Past Surgical History:  Procedure Laterality Date   CHOLECYSTECTOMY     TRACHEOSTOMY TUBE PLACEMENT N/A 02/25/2022   Procedure: TRACHEOSTOMY;  Surgeon: Clyde Canterbury, MD;  Location: [facility] ORS;  Service: ENT;  Laterality: N/A;    Family History: reviewed and not relevant to CC  Social History:  reports that he has been smoking cigarettes. He has been smoking an average of 1 pack per day. He has never used smokeless tobacco. He reports that he does not drink alcohol and does not use drugs.  Current Medications:  Prior to Admission medications   Medication Sig Start Date End Date Taking? Authorizing Provider  losartan (COZAAR) 100 MG tablet Take 100 mg by mouth daily. 02/01/22  Yes [provider]  OZEMPIC, 0.25 OR 0.5 MG/DOSE, 2 MG/3ML SOPN SMARTSIG:0.25 Milligram(s) SUB-Q Once a Week 12/09/21  Yes [provider]  torsemide (DEMADEX) 20 MG tablet Take 20 mg by mouth daily. 12/18/21  Yes [provider]  Aspirin-Salicylamide-Caffeine (BC HEADACHE POWDER PO) Take 1 packet by mouth as needed (for pain).    [provider]  cephALEXin (KEFLEX) 500 MG capsule Take 1 capsule (500 mg total) by mouth 4 (four) times daily. Patient not taking: Reported on 02/11/2022 12/16/18   Davonna Belling, MD  cyclobenzaprine (FLEXERIL) 10 MG tablet Take 0.5-1 tablets (5-10 mg total) by mouth 2 (two) times daily as needed for muscle spasms. Patient not taking: Reported on 02/11/2022 03/12/18   Margarita Mail, PA-C   gabapentin (NEURONTIN) 600 MG tablet Take 600 mg by mouth at bedtime. Patient not taking: Reported on 02/11/2022 10/13/21   [provider]  LABETALOL HCL PO Take by mouth. Patient not taking: Reported on 02/11/2022    [provider]  meloxicam (MOBIC) 15 MG tablet Take 1 tablet (15 mg total) by mouth daily. Take 1 daily with food. Patient not taking: Reported on 02/11/2022 03/12/18   Margarita Mail, PA-C  metFORMIN (GLUCOPHAGE) 500 MG tablet Take 500 mg by mouth 2 (two) times daily with a meal. Patient not taking: Reported on 02/11/2022    [provider]  METFORMIN HCL ER, MOD, PO Take by mouth. Patient not taking: Reported on 02/11/2022    [provider]  METOPROLOL SUCCINATE ER PO Take by mouth. Patient not taking: Reported on 02/11/2022    [provider]  oxyCODONE-acetaminophen (PERCOCET) 10-325 MG tablet Take 1 tablet by mouth See admin instructions. Take 1 tablet by mouth 4-5 times a day as needed for pain Patient not taking: Reported on 02/11/2022    [provider]  oxyCODONE-acetaminophen (PERCOCET/ROXICET) 5-325 MG tablet Take 1 tablet by mouth every 8 (eight) hours as needed for severe pain. Patient not taking: Reported on 02/11/2022 12/16/18   Davonna Belling, MD  predniSONE (DELTASONE) 20 MG tablet Take 2 tablets (40 mg total) by mouth daily. Patient not taking: Reported on 02/11/2022 04/04/15   Davonna Belling, MD    Allergies:  Allergies as of 02/11/2022 - Review Complete 02/11/2022  Allergen Reaction Noted   Hydrocodone Swelling 04/04/2015  ROS:  Unable to obtain secondary to patient status   Objective:     BP 135/68 (BP Location: Right Wrist)   Pulse 79   Temp 98.4 F (36.9 C) (Oral)   Resp 18   Ht 5\' 9"  (1.753 m)   Wt (!) 204 kg   SpO2 93%   BMI 66.41 kg/m   Constitutional :  no distress and trach in place, unable to answer questions  Lymphatics/Throat:  no asymmetry, masses, or scars  Respiratory:   clear to auscultation bilaterally  Cardiovascular:  regular rate and rhythm  Gastrointestinal: soft, non-tender; bowel sounds normal; no masses,  no organomegaly.   Musculoskeletal: Steady movement  Skin: Cool and moist, port site incisions from likely lap chole  Psychiatric: Normal affect, non-agitated, not confused       LABS:     Latest Ref Rng & Units 03/11/2022    7:57 AM 03/10/2022    5:32 AM 03/09/2022    6:42 AM  CMP  Glucose 70 - 99 mg/dL 108  115  140   BUN 6 - 20 mg/dL 89  79  101   Creatinine 0.61 - 1.24 mg/dL 3.69  3.51  5.26   Sodium 135 - 145 mmol/L 140  138  139   Potassium 3.5 - 5.1 mmol/L 4.6  4.2  5.3   Chloride 98 - 111 mmol/L 102  99  102   CO2 22 - 32 mmol/L 28  28  25    Calcium 8.9 - 10.3 mg/dL 7.9  7.7  8.0       Latest Ref Rng & Units 03/11/2022    7:57 AM 03/10/2022    5:32 AM 03/09/2022    6:42 AM  CBC  WBC 4.0 - 10.5 K/uL 7.7  8.1  7.9   Hemoglobin 13.0 - 17.0 g/dL 8.1  7.9  8.4   Hematocrit 39.0 - 52.0 % 27.5  26.6  28.7   Platelets 150 - 400 K/uL 197  206  224     RADS: CLINICAL DATA:  Sepsis   EXAM: CT CHEST, ABDOMEN AND PELVIS WITHOUT CONTRAST   TECHNIQUE: Multidetector CT imaging of the chest, abdomen and pelvis was performed following the standard protocol without IV contrast.   RADIATION DOSE REDUCTION: This exam was performed according to the departmental dose-optimization program which includes automated exposure control, adjustment of the mA and/or kV according to patient size and/or use of iterative reconstruction technique.   COMPARISON:  Previous studies including chest radiograph done earlier today, CT chest done on 06/04/2020   FINDINGS: CT CHEST FINDINGS   Cardiovascular: Heart is enlarged in size. There is ectasia of main pulmonary artery measuring 4.2 cm suggesting pulmonary arterial hypertension.   Mediastinum/Nodes: There are slightly enlarged lymph nodes in mediastinum largest in the precarinal region measuring  1.3 cm in short axis. Tip of endotracheal tube is at the level of aortic arch. Enteric tube is noted traversing the esophagus. Tip of right PICC line is seen in superior vena cava.   Lungs/Pleura: Patchy ground-glass and alveolar infiltrates are seen in both lungs. Dense infiltrates are seen in the posterior aspects of both lower lung fields with air bronchograms. Small pleural effusions are seen. There is no pneumothorax.   Musculoskeletal: No acute abnormalities are seen in the bony structures in the thorax.   CT ABDOMEN PELVIS FINDINGS   Hepatobiliary: No focal abnormalities are seen in liver. There is no dilation of bile ducts. Surgical clips are seen in gallbladder fossa.  Pancreas: No focal abnormalities are seen.   Spleen: Spleen measures 13.9 cm in maximum diameter.   Adrenals/Urinary Tract: Adrenals are unremarkable. There is no hydronephrosis. There are no renal or ureteral stones. Foley catheter is seen in the bladder. There is no significant wall thickening in the bladder.   Stomach/Bowel: Tip of NG tube is seen in the antrum of the stomach. There is contrast in the lumen of stomach and few small bowel loops. Small bowel loops are not dilated. The appendix is not dilated. There is no significant wall thickening in colon. There is no pericolic stranding. Few diverticula seen in the colon without signs of focal diverticulitis.   Vascular/Lymphatic: Scattered arterial calcifications are seen.   Reproductive: Coarse calcifications are seen prostate.   Other: There is no ascites or pneumoperitoneum.   Musculoskeletal: Severe degenerative changes are noted at the L4-L5 level in lumbar spine with significant interval worsening in comparison with the study of 01/28/2016.   IMPRESSION: Extensive ground-glass, alveolar and patchy dense infiltrates in both lungs suggesting multifocal pneumonia. Part of this finding may suggest underlying pulmonary edema. Small  bilateral pleural effusions are seen.   Cardiomegaly. There is ectasia of the main pulmonary artery suggesting pulmonary arterial hypertension.   There is no evidence of intestinal obstruction or pneumoperitoneum. There is no hydronephrosis. Appendix is not dilated.   UB diverticula are seen in the colon without signs of focal diverticulitis.   Severe degenerative changes are noted at L4-L5 level with significant interval worsening. Findings may be due to severe disc degeneration. If there is clinical suspicion for discitis or osteomyelitis, follow-up MRI may be considered.     Electronically Signed   By: Elmer Picker M.D.   On: 02/21/2022 16:17   Assessment:   Need for long term enteral feeds  Plan:   Reviewed CT imaging.  Due to body habitus and enlarged liver, IR guided placement and PEG will not be an option.  Will proceed with robotic assisted laparoscopic gastrostomy tube placement. Alternatives include continued dobhoff but has high risk of infection and aspiration, malfunction.  The risk of surgery include, but not limited to, bleeding, post-op infxn, malfunction. The risks of general anesthetic, if used, includes MI, CVA, sudden death or even reaction to anesthetic medications also discussed. Benefits include nutrition placement  Discussed with daughter who has been acting POA and she verbalized understanding and all questions were answered to the patient's satisfaction.  Tentatively scheduled for Friday.  Hold heparin drip 2hours prior to start time.  labs/images/medications/previous chart entries reviewed personally and relevant changes/updates noted above.

## 2022-03-11 NOTE — Progress Notes (Signed)
Updated pt's daughter [name]  via telephone.  Discussed that pt is more alert and starting to be able to follow some simple commands and working with PT, has tolerated PSV, plan for TC trial following dialysis today, and that plan is for general surgery to place PEG on Friday.  All questions answered to her satisfaction, she is very appreciative of update.  Also plans to come to bedside later today.   Darel Hong, AGACNP-BC [facility] Prefer epic messenger for cross cover needs If after hours, please call E-link

## 2022-03-11 NOTE — Progress Notes (Signed)
ANTICOAGULATION CONSULT NOTE  Pharmacy Consult for IV Heparin  Indication: RUE DVT associated with PICC  Patient Measurements: Height: 5\' 9"  (175.3 cm) Weight:  (bed scale broken) IBW/kg (Calculated) : 70.7 Heparin Dosing Weight: 123.4 kg   Labs: Recent Labs    03/09/22 0642 03/10/22 0532  HGB 8.4* 7.9*  HCT 28.7* 26.6*  PLT 224 206  HEPARINUNFRC 0.46 0.48  CREATININE 5.26* 3.51*   Estimated Creatinine Clearance: 47.1 mL/min (A) (by C-G formula based on SCr of 3.51 mg/dL (H)).  Medical History: Past Medical History:  Diagnosis Date   Diabetes mellitus without complication ([place])    Hypertension    Pancreatitis    Assessment: Patient is a 46 y/o M with medical history including DM, HTN, pancreatitis, tobacco use disorder, systolic CHF, OSA, DM c/b diabetic neuropathy, lumbar radiculopathy, morbid obesity who is admitted with acute respiratory failure in setting of acute CHF, hypertensive urgency, and pneumonia. Patient with prolonged admission on ventilator complicated by acute renal failure.  Pharmacy consulted to initiate and manage IV heparin for RUE DVT.  Goal of Therapy:  Heparin level 0.3-0.7 units/ml Monitor platelets by anticoagulation protocol: Yes   Plan: heparin level remains therapeutic Continue heparin at 2800 units/hr recheck heparin level on 8/23 with AM labs Daily CBC while on IV heparin  Dallie Piles 03/11/2022,7:04 AM

## 2022-03-11 NOTE — Progress Notes (Signed)
NAME:  Samuel Chavez, MRN:  [id number], DOB:  [birth year], LOS: 57 ADMISSION DATE:  02/11/2022  History of Present Illness:  46 y.o male with significant PMH as below who presented to the ED with chief complaints of SOB and worsening bilateral lower extremities edema.   ED Course: In the emergency department, the temperature was 37.3C, the heart rate 99 beats/minute, the blood pressure 187/102 mm Hg, the respiratory rate 20 breaths/minute, and the oxygen saturation 89% on. He was noted to be drowsy but still protecting his airway and responding appropriately.   Pertinent  Medical History   Past Medical History:  Diagnosis Date   Diabetes mellitus without complication ([place])    Hypertension    Pancreatitis     Significant Hospital Events: Including procedures, antibiotic start and stop dates in addition to other pertinent events   7/25: Admitted to hospitalist service w/acute on chronic hypoxic hypercapnic resp. failure requiring BiPAP.Failed BiPAP and intubated. PCCM consulted 7/26: INTUBATED, difficlut to sedate, started [place] 7/27: severe hypoxia, PEEP increased to 15 but decreased back to 10 7/28: Persistent severe hypoxia, ventilator changes made 7/29: Persistent hypoxia, recruitment maneuvers instituted, ventilator changes made, empiric heparin as patient cannot be scanned query PE -02/17/22- patient is severely critically ill with bilateral multifocal pneumonia on sedation and paralysis with mechanical ventilation maximal settings.  He is at cusp of death, we met with daughter today and discussed severity of illness. Unable to perform CT due to severity of critical illness. Met with previous PCCM doc and discussed medical plan.  Patient had recruitment maneuvers last few days without improvement, on heparin gtt for possible PE.  02/18/22- patient continues to require maximal settings on ventilator despite good UOP.  He destaturated to <50% spO2 on MV today required BGV.    02/19/22- patient continues to require 100% FiO2 on maximal setting with high PEEP ladder for possible ARDS.  He was diuresed and on steroids with development of AKI.  His CXR had slight interval improvement on right. We discussed case with vascular surgery regarding possible empiric tPA for PE.  02/20/22- patient was unable to get CT today due to continued severe hypoxemia.  Daughter and other family members at bedside we reviewed case and medical plan.  There seems to be some confusion from family as they had asked me to wake patient up and take off ventilator even though I had explained multiple times that he is at very high risk for death.  They laughed at my comments and seemed to think it was not true. We may still be able to do bronchoscopy but its high risk.  We have reduced IV infusions by switching some medications to OGT route.  02/21/22- patient is weaned to 60%FiO2.  Plan for possible bronch if patient is tolerating.  Family at bedside.  If able to we will obtain more imaging and VQ scan to rule out PE.  02/22/22- Patient weaned to 50% on PRVC.  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 CBC stable , CMP with improved GFR. Monitoring Na, pharmacy and renal following for electrolytes/renal function.  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  Overall marked improvement on ventilator over past 48h. Met with daughter at bedside reviewed medical plan.  02/24/22-Vent requirements continue to slowly improve, currently 40% FiO2 and 14 PEEP.  AKI continues to slowly improve, remains hyperkalemic with potassium of 5.8.  Placed back on  Veltassa, Diurese x1.  Trach scheduled for tomorrow at 1:15 PM 02/25/22- Diurese with Lasix x1 dose. Trach scheduled for today. 02/26/22: Remains mechanically ventilated via newly placed tracheostomy will attempt to wean ventilator settings today.  Perform WUA  02/26/22: Pt remains mechanically  ventilated via tracheostomy vent settings: PEEP 14/FiO2 80% 02/27/22: Fevers, ID consulted. Repeating Tracheal aspirate and UA.  Zosyn started 02/28/22: Persistent fevers, Tracheal aspirate with Staph aureus, start Linezolid.  Worsening Creatinine, considering Lasix gtt given high vent requirements (100% FiO2, 14 peep).  D/c fentanyl gtt and decrease oral benzos/narcotics 03/01/22: worsening anuric renal failure. HD line placed and started on hemodialysis. 03/02/22: Vent support slowly improving. Plan for HD again today. 8/14-8/15: remains febrile, encephalopathic despite HD sessions 8/16 fever curve improved, clots noted in LUE 8/18: continued low grade fever (t max 100.6).  Receiving HD.  Slight improvement in encephalopathy 8/19 off pressors, off sedation, remains on vent  8/20: Afebrile, tolerating PSV 8/21: Remains afebrile, obtain CT Abdomen for IR to evaluate for PEG placement.  Tolerating PSV (15/5).  Consult PT for passive ROM 8/22: Extremely weak, but able to MAE to command, nodding to questions. Tolerating PSV, will trial TC. Deemed not candidate for IR placement of PEG, will have General Surgery evaluate  Consults:  PCCM Nephrology Vascular Surgery Palliative Care ENT Infectious Disease  Procedures:  7/25: Intubation 7/26: PICC triple-lumen, right basilic vein 4/27: Left radial arterial line  8/08: Size 8 proximal XLT Shiley inserted 8/12: Right IJ HD catheter placed  Significant Diagnostic Tests:  7/25  Chest Xray:Chest x-ray showed cardiomegaly and pulmonary vascular congestion without overt pulmonary edema 7/25 Echocardiogram: difficult study, LVEF was estimated at 55 to 60%, dilated LV moderate LVH, grade 1 DD, enlargement of the right ventricle, left atrial size moderately dilated, right atrial size moderately dilated this is consistent with restrictive physiology (echo reading not congruous with prior echoes from [facility]) 7/29: Venous US BLE>>IMPRESSION: No  lower extremity DVT. 7/30 Limited echo: LVEF 40 to 45% decreased LV function, no wall motion abnormalities, moderate dilation of the LV concentric LVH, right ventricular size moderately enlarged, right atrial enlargement, this is more consistent with prior [facility] scans 8/4: CT Head>>No acute intracranial abnormality. Paranasal sinusitis. Bilateral complete opacification of the mastoid air cells and inner ears, as can be seen in the setting of otitis media. Correlate with symptoms. 8/4: CT Chest/Abdomen/Pelvis>>Extensive ground-glass, alveolar and patchy dense infiltrates in both lungs suggesting multifocal pneumonia. Part of this finding may suggest underlying pulmonary edema. Small bilateral pleural effusions are seen. Cardiomegaly. There is ectasia of the main pulmonary artery suggesting pulmonary arterial hypertension. There is no evidence of intestinal obstruction or pneumoperitoneum. There is no hydronephrosis. Appendix is not dilated. UB diverticula are seen in the colon without signs of focal diverticulitis. Severe degenerative changes are noted at L4-L5 level with significant interval worsening. Findings may be due to severe disc degeneration. If there is clinical suspicion for discitis or osteomyelitis, follow-up MRI may be considered 8/4: Lung V/Q>>Pulmonary embolism absent. 8/16: RUE brachial and axillary vein DVT associated with PICC line  Micro Data:  7/25: SARS-CoV-2 PCR> negative 7/25: MRSA PCR>> NEG 7/27: Strep pneumoniae Ag>> negative 7/27: Legionella Ag>> negative 7/27: Mycoplasma>> <770 7/27: Sputum>> Strep pneumo,Haemophilus influenzae 7/30 Sputum cult >> negative 8/4: Tracheal aspirate>>negative 8/9: Tracheal aspirate>>MRSA 8/10: RVP>>negative 8/10: Right nasal Wound culture>>STAPHYLOCOCCUS AUREUS, PROVOTELLA 8/10: MRSA PCR>> Positive 8/11: Blood culture 1/4>>Staphylococcus epidermidis MecA/C (suspect contaminant)  Antimicrobials:  Cefepime 7/27  >>7/31 Ceftriaxone 7/31>>8/4 Vancomycin 7/27 >>  7/29, restarted 7/30 (resumed due to fever spike on Maxipime) ? Resistant Strep pneumo >> 7/31 Unasyn 8/14>>8/17 Augmentin 8/17>>8/21 Linezolid 8/11>>8/20  Interim History / Subjective:  -No significant events noted overnight -Afebrile, hemodynamically stable, no vasopressors -Awake and alert, extremely weak, but able to MAE to command and nod to questions. -Tolerating PSV (weaned to 10/5) ~ will trial TC  -Deemed not a candidate for IR placement of PEG yesterday -May get HD today ~ will need to consider PermCath placement   Objective   Blood pressure (!) 140/75, pulse 87, temperature 98.8 F (37.1 C), temperature source Oral, resp. rate 17, height 5' 9" (1.753 m), weight (!) 204 kg, SpO2 95 %.    Vent Mode: PSV FiO2 (%):  [50 %] 50 % PEEP:  [5 cmH20] 5 cmH20 Pressure Support:  [12 cmH20-15 cmH20] 12 cmH20   Intake/Output Summary (Last 24 hours) at 03/11/2022 9449 Last data filed at 03/11/2022 0500 Gross per 24 hour  Intake 2167.55 ml  Output 2450 ml  Net -282.45 ml    Filed Weights    Examination: GENERAL:L Acute on chronically ill appearing male, awake, on vent, in NAD HENT: Atraumatic, normocephalic, neck supple, Tracheostomy dressing clean, dry and intact LUNGS: distant breath coarse sounds, on vent, even, nonlabored CV: RRR, s1s2, no M/R/G ABDOMEN: Obese, soft, nontender, no guarding or rebound tenderness, BS+ x4, liquid stool in bed MUSCULOSKELETAL: Improving marked edema, Ext warm NEURO: Awake and alert, unable to assess orientation due to Trach, extremely weak but able to MAE to command, nodding to questions, pupils PERRL   Resolved Hospital Problem list   Community-acquired pneumonia: Streptococcus pneumoniae and Haemophilus influenzae ~ TREATED MRSA HCAP ~ TREATED Provetella Sinusitis ~ TREATED Hypernatremia  Assessment & Plan:  Acute on chronic hypoxemic and hypercapneic respiratory failure now s/p  tracheostomy PMHx: OSA/OHS -Full vent support, implement lung protective strategies -Plateau pressures less than 30 cm H20 -Wean FiO2 & PEEP as tolerated to maintain O2 sats >92% -Follow intermittent Chest X-ray & ABG as needed -Spontaneous Breathing Trials when respiratory parameters met and mental status permits -Implement VAP Bundle -Prn Bronchodilators  MRSA HCAP- completed linezolid x 7 days Provetella sinusitis- completed unasyn/augmentin Persistent low grade fever, suspect due to RUE DVT ~ RESOLVED -Monitor fever curve -Trend WBC's & Procalcitonin -Follow cultures as above -ID following, appreciate input ~ Completed course of ABX as above  Acute renal failure with hyperkalemia now on iHD -Monitor I&O's / urinary output -Follow BMP -Ensure adequate renal perfusion -Avoid nephrotoxic agents as able -Replace electrolytes as indicated -Nephrology following, appreciate input -May need to consider Permcath placement  DM2 with hyperglycemia- now on basal bolus -CBG's q4h; Target range of 140 to 180 -SSI & Semglee -Follow ICU Hypo/Hyperglycemia protocol  Ongoing severe toxic encephalopathy- suspect  ? Critical Illness Myopathy/polyneuropathy MRI brain/C spine okay 8/15.  Still uremic.  EEG 8/16 neg.  -Avoid sedating medications as able -Ammonia normal, TSH normal -PT/OT following -Mobilize as able  PICC associated DVT- PICC removed -Heparin gtt -Once PEG completed, can transition to Eliquis  - IR PEG consult -Not a candidate for IR placement of PEG ~ will consult General Surgery to evaulate   Best Practice (right click and "Reselect all SmartList Selections" daily)   Diet/type: tubefeeds DVT prophylaxis: systemic heparin GI prophylaxis: PPI Lines: HD catheter Foley:  Out Code Status:  full code Last date of multidisciplinary goals of care discussion [03/11/22]  Long term prognosis guarded  Critical Care Time: 35 minutes  Darel Hong, AGACNP-BC  [facility] Prefer epic messenger for cross cover needs If after hours, please call E-link

## 2022-03-12 DIAGNOSIS — J9601 Acute respiratory failure with hypoxia: Secondary | ICD-10-CM | POA: Diagnosis not present

## 2022-03-12 DIAGNOSIS — J15212 Pneumonia due to Methicillin resistant Staphylococcus aureus: Secondary | ICD-10-CM | POA: Diagnosis not present

## 2022-03-12 DIAGNOSIS — J9602 Acute respiratory failure with hypercapnia: Secondary | ICD-10-CM | POA: Diagnosis not present

## 2022-03-12 LAB — RENAL FUNCTION PANEL
Albumin: 2.2 g/dL — ABNORMAL LOW (ref 3.5–5.0)
Anion gap: 7 (ref 5–15)
BUN: 65 mg/dL — ABNORMAL HIGH (ref 6–20)
CO2: 29 mmol/L (ref 22–32)
Calcium: 8.1 mg/dL — ABNORMAL LOW (ref 8.9–10.3)
Chloride: 104 mmol/L (ref 98–111)
Creatinine, Ser: 2.56 mg/dL — ABNORMAL HIGH (ref 0.61–1.24)
GFR, Estimated: 31 mL/min — ABNORMAL LOW (ref >60)
Glucose, Bld: 132 mg/dL — ABNORMAL HIGH (ref 70–99)
Phosphorus: 6.1 mg/dL — ABNORMAL HIGH (ref 2.5–4.6)
Potassium: 4.3 mmol/L (ref 3.5–5.1)
Sodium: 140 mmol/L (ref 135–145)

## 2022-03-12 LAB — CBC WITH DIFFERENTIAL/PLATELET
Abs Immature Granulocytes: 0.03 10*3/uL (ref 0.00–0.07)
Basophils Absolute: 0 10*3/uL (ref 0.0–0.1)
Basophils Relative: 0 %
Eosinophils Absolute: 0.4 10*3/uL (ref 0.0–0.5)
Eosinophils Relative: 6 %
HCT: 27.4 % — ABNORMAL LOW (ref 39.0–52.0)
Hemoglobin: 8 g/dL — ABNORMAL LOW (ref 13.0–17.0)
Immature Granulocytes: 0 %
Lymphocytes Relative: 12 %
Lymphs Abs: 0.8 10*3/uL (ref 0.7–4.0)
MCH: 26.3 pg (ref 26.0–34.0)
MCHC: 29.2 g/dL — ABNORMAL LOW (ref 30.0–36.0)
MCV: 90.1 fL (ref 80.0–100.0)
Monocytes Absolute: 0.8 10*3/uL (ref 0.1–1.0)
Monocytes Relative: 11 %
Neutro Abs: 5 10*3/uL (ref 1.7–7.7)
Neutrophils Relative %: 71 %
Platelets: 198 10*3/uL (ref 150–400)
RBC: 3.04 MIL/uL — ABNORMAL LOW (ref 4.22–5.81)
RDW: 16.1 % — ABNORMAL HIGH (ref 11.5–15.5)
WBC: 7 10*3/uL (ref 4.0–10.5)
nRBC: 0 % (ref 0.0–0.2)

## 2022-03-12 LAB — GLUCOSE, CAPILLARY
Glucose-Capillary: 107 mg/dL — ABNORMAL HIGH (ref 70–99)
Glucose-Capillary: 132 mg/dL — ABNORMAL HIGH (ref 70–99)
Glucose-Capillary: 86 mg/dL (ref 70–99)
Glucose-Capillary: 86 mg/dL (ref 70–99)
Glucose-Capillary: 92 mg/dL (ref 70–99)

## 2022-03-12 LAB — MAGNESIUM: Magnesium: 2.2 mg/dL (ref 1.7–2.4)

## 2022-03-12 LAB — C-REACTIVE PROTEIN: CRP: 1.3 mg/dL — ABNORMAL HIGH (ref <1.0)

## 2022-03-12 LAB — HEPARIN LEVEL (UNFRACTIONATED): Heparin Unfractionated: 0.3 IU/mL (ref 0.30–0.70)

## 2022-03-12 LAB — PHOSPHORUS: Phosphorus: 6 mg/dL — ABNORMAL HIGH (ref 2.5–4.6)

## 2022-03-12 MED ORDER — SODIUM BICARBONATE 650 MG PO TABS
650.0000 mg | ORAL_TABLET | Freq: Once | ORAL | Status: AC
  Administered 2022-03-12: 650 mg
  Filled 2022-03-12: qty 1

## 2022-03-12 MED ORDER — FAMOTIDINE IN NACL 20-0.9 MG/50ML-% IV SOLN
20.0000 mg | Freq: Once | INTRAVENOUS | Status: AC
  Administered 2022-03-12: 20 mg via INTRAVENOUS
  Filled 2022-03-12: qty 50

## 2022-03-12 MED ORDER — METOPROLOL TARTRATE 5 MG/5ML IV SOLN
5.0000 mg | Freq: Four times a day (QID) | INTRAVENOUS | Status: DC
  Administered 2022-03-12 – 2022-03-17 (×19): 5 mg via INTRAVENOUS
  Filled 2022-03-12 (×19): qty 5

## 2022-03-12 MED ORDER — PANCRELIPASE (LIP-PROT-AMYL) 12000-38000 UNITS PO CPEP
24000.0000 [IU] | ORAL_CAPSULE | Freq: Once | ORAL | Status: AC
  Administered 2022-03-12: 24000 [IU] via ORAL

## 2022-03-12 NOTE — Progress Notes (Signed)
Physical Therapy Treatment Patient Details Name: Samuel Chavez MRN: [id number] DOB: 1976-01-18 Today's Date: 03/12/2022   History of Present Illness Pt is 54 YOM admitted for PNA, renail failure, AKI, hyperkalemia, CHF, Resp failure, sepsis, and is s/p trach placement. PMH includes: DM, HTN, sCHF, OSA, obesity, ARDS Resp failure, sepsis.    PT Comments    Patient alert, yawning. Minimally responsive, nods head at times. He is unable to follow direction or too weak to move either LE at this time. All LE exercises were passive today. He is not quite ready to participate with skilled PT at this time due to cognition and/ or profound weakness. Patient may benefit from mobility specialist for now to work on PROM exercises until patient is more able to participate actively. I will plan to see patient again tomorrow to determine if appropriate for continued skilled PT.     Recommendations for follow up therapy are one component of a multi-disciplinary discharge planning process, led by the attending physician.  Recommendations may be updated based on patient status, additional functional criteria and insurance authorization.  Follow Up Recommendations  Skilled nursing-short term rehab (<3 hours/day) Can patient physically be transported by private vehicle: No   Assistance Recommended at Discharge Frequent or constant Supervision/Assistance  Patient can return home with the following Two people to help with walking and/or transfers;Two people to help with bathing/dressing/bathroom;Direct supervision/assist for medications management;Help with stairs or ramp for entrance;Assist for transportation;Assistance with feeding;Assistance with cooking/housework;Direct supervision/assist for financial management   Equipment Recommendations  None recommended by PT    Recommendations for Other Services       Precautions / Restrictions Precautions Precautions: Fall Precaution Comments: contact  precautions, R IJ temp cath Restrictions Weight Bearing Restrictions: No Other Position/Activity Restrictions: HOB 30 deg     Mobility  Bed Mobility Overal bed mobility: Needs Assistance             General bed mobility comments: total assist    Transfers                   General transfer comment: unsafe to attempt    Ambulation/Gait                   Stairs             Wheelchair Mobility    Modified Rankin (Stroke Patients Only)       Balance                                            Cognition Arousal/Alertness: Awake/alert Behavior During Therapy: Flat affect Overall Cognitive Status: Difficult to assess                                 General Comments: pt shakes and nods heads to asnwer yes/no questions ( minimally)        Exercises Other Exercises Other Exercises: hip internal rotation, AP, Heel slides, SAQ  10 reps Passive- unable to assist at all today    General Comments        Pertinent Vitals/Pain Pain Assessment Breathing: normal Negative Vocalization: none Facial Expression: smiling or inexpressive Body Language: relaxed Consolability: no need to console PAINAD Score: 0    Home Living Family/patient expects to be discharged to:: Private residence Living Arrangements:  Children;Spouse/significant other                      Prior Function            PT Goals (current goals can now be found in the care plan section) Acute Rehab PT Goals PT Goal Formulation: Patient unable to participate in goal setting Progress towards PT goals: Not progressing toward goals - comment (patient is not able to participate in LE exercises yet due to weakness. PROM only today)    Frequency    Min 2X/week      PT Plan Current plan remains appropriate    Co-evaluation              AM-PAC PT "6 Clicks" Mobility   Outcome Measure  Help needed turning from your back to  your side while in a flat bed without using bedrails?: Total Help needed moving from lying on your back to sitting on the side of a flat bed without using bedrails?: Total Help needed moving to and from a bed to a chair (including a wheelchair)?: Total Help needed standing up from a chair using your arms (e.g., wheelchair or bedside chair)?: Total Help needed to walk in hospital room?: Total Help needed climbing 3-5 steps with a railing? : Total 6 Click Score: 6    End of Session Equipment Utilized During Treatment: Oxygen Activity Tolerance: Patient tolerated treatment well Patient left: in bed Nurse Communication: Mobility status PT Visit Diagnosis: Muscle weakness (generalized) (M62.81);Other abnormalities of gait and mobility (R26.89)     Time: 8264-1583 PT Time Calculation (min) (ACUTE ONLY): 10 min  Charges:  $Therapeutic Exercise: 8-22 mins                     Brady Plant, PT, GCS 03/12/22,3:19 PM

## 2022-03-12 NOTE — Evaluation (Signed)
Occupational Therapy Evaluation Patient Details Name: Samuel Chavez MRN: [id number] DOB: [birth year] Today's Date: 03/12/2022   History of Present Illness Pt is 60 YOM admitted for PNA, renail failure, AKI, hyperkalemia, CHF, Resp failure, sepsis, and is s/p trach placement. PMH includes: DM, HTN, sCHF, OSA, obesity, ARDS Resp failure, sepsis.   Clinical Impression   Mr Fedor was seen for OT evaluation this date. Prior to hospital admission, pt was Independent for mobility and ADLs. Pt lives with family, communication limited to yes/no with head nods 2/2 tracheostomy. Pt presents to acute OT demonstrating impaired ADL performance and functional mobility 2/2 decreased activity tolerance and functional strength/ROM/balance deficits. Pt follows 1 step commands and is eager to participate in mobility. B grip 3-/5, RUE weaker than LUE.   Pt currently requires MOD A hand over hand assist oral care at bed level with L and R UE. Anticiapte +3 for toileting at bed level. Tolerated seated therex as described below. Pt would benefit from skilled OT to address noted impairments and functional limitations (see below for any additional details). Upon hospital discharge, recommend STR to maximize pt safety and return to PLOF.   Recommendations for follow up therapy are one component of a multi-disciplinary discharge planning process, led by the attending physician.  Recommendations may be updated based on patient status, additional functional criteria and insurance authorization.   Follow Up Recommendations  Skilled nursing-short term rehab (<3 hours/day)    Assistance Recommended at Discharge Frequent or constant Supervision/Assistance  Patient can return home with the following Two people to help with walking and/or transfers;Two people to help with bathing/dressing/bathroom    Functional Status Assessment  Patient has had a recent decline in their functional status and demonstrates the ability to  make significant improvements in function in a reasonable and predictable amount of time.  Equipment Recommendations  Other (comment) (defer to next venue of care)    Recommendations for Other Services       Precautions / Restrictions Precautions Precautions: Fall Restrictions Weight Bearing Restrictions: No      Mobility Bed Mobility Overal bed mobility: Needs Assistance Bed Mobility: Rolling Rolling: Total assist, +2 for physical assistance         General bed mobility comments: unable to achieve with +2, would require +3 for full log roll    Transfers                   General transfer comment: unsafe to attempt          ADL either performed or assessed with clinical judgement   ADL Overall ADL's : Needs assistance/impaired                                       General ADL Comments: MOD A hand over hand assist oral care at bed level with L and R UE. Anticiapte +3 for toileting at bed level      Pertinent Vitals/Pain Pain Assessment Pain Assessment: Faces Faces Pain Scale: Hurts little more Pain Location: throat Pain Descriptors / Indicators: Grimacing Pain Intervention(s): Limited activity within patient's tolerance, Repositioned     Hand Dominance     Extremity/Trunk Assessment Upper Extremity Assessment Upper Extremity Assessment: LUE deficits/detail;RUE deficits/detail RUE Deficits / Details: 3-/5 grip. 3/5 wrist flexion/extension. 2/5 elbow flexion/extension LUE Deficits / Details: 3-/5 grip. 3+/5 wrist flexion/extension. 2+/5 elbow flexion/extension   Lower Extremity Assessment Lower Extremity Assessment:  RLE deficits/detail;LLE deficits/detail RLE Deficits / Details: 2-/5 grossly       Communication Communication Communication: Tracheostomy   Cognition Arousal/Alertness: Awake/alert Behavior During Therapy: WFL for tasks assessed/performed Overall Cognitive Status: Difficult to assess                                  General Comments: pt shakes and nods heads to asnwer yes/no questions     General Comments       Exercises Exercises: General Lower Extremity, General Upper Extremity General Exercises - Upper Extremity Elbow Flexion: AROM, Strengthening, Both, 10 reps, Supine Elbow Extension: AROM, Strengthening, Both, 10 reps, Supine Wrist Flexion: AROM, Strengthening, Both, 10 reps, Supine Wrist Extension: AROM, Strengthening, Both, 10 reps, Supine Digit Composite Flexion: AROM, Strengthening, Both, 10 reps, Supine Composite Extension: AROM, Strengthening, Both, 10 reps, Supine General Exercises - Lower Extremity Ankle Circles/Pumps: AROM, Strengthening, Both, 10 reps, Supine Quad Sets: AROM, Strengthening, Both, 10 reps, Supine Hip ABduction/ADduction: AROM, Strengthening, Both, 10 reps, Supine   Shoulder Instructions      Home Living Family/patient expects to be discharged to:: Private residence Living Arrangements: Children;Spouse/significant other                                      Prior Functioning/Environment Prior Level of Function : Independent/Modified Independent             Mobility Comments: pt reports ambulatory without AD community distances - communicated via head nods/shakes however mildly delayed responses          OT Problem List: Decreased strength;Decreased range of motion;Decreased activity tolerance;Impaired balance (sitting and/or standing);Decreased safety awareness;Impaired UE functional use      OT Treatment/Interventions: Self-care/ADL training;Therapeutic exercise;Energy conservation;DME and/or AE instruction;Therapeutic activities;Patient/family education;Balance training    OT Goals(Current goals can be found in the care plan section) Acute Rehab OT Goals Patient Stated Goal: to drink water OT Goal Formulation: With patient Time For Goal Achievement: 03/26/22 Potential to Achieve Goals: Good ADL Goals Pt Will  Perform Grooming: with min assist;bed level Pt Will Transfer to Toilet: with max assist;with +2 assist (rolling bed level) Pt/caregiver will Perform Home Exercise Program: Increased ROM;Increased strength;Right Upper extremity;Left upper extremity;With minimal assist  OT Frequency: Min 2X/week    Co-evaluation              AM-PAC OT "6 Clicks" Daily Activity     Outcome Measure Help from another person eating meals?: A Lot Help from another person taking care of personal grooming?: A Lot Help from another person toileting, which includes using toliet, bedpan, or urinal?: Total Help from another person bathing (including washing, rinsing, drying)?: Total Help from another person to put on and taking off regular upper body clothing?: A Lot Help from another person to put on and taking off regular lower body clothing?: A Lot 6 Click Score: 10   End of Session Nurse Communication: Mobility status  Activity Tolerance: Patient tolerated treatment well Patient left: in bed;with call bell/phone within reach  OT Visit Diagnosis: Repeated falls (R29.6);Muscle weakness (generalized) (M62.81);Other abnormalities of gait and mobility (R26.89)                Time: 7858-8502 OT Time Calculation (min): 39 min Charges:  OT General Charges $OT Visit: 1 Visit OT Evaluation $OT Eval High Complexity: 1 High OT Treatments $  Self Care/Home Management : 8-22 mins $Therapeutic Exercise: 8-22 mins  Dessie Coma, M.S. OTR/L  03/12/22, 2:04 PM  ascom [phone]

## 2022-03-12 NOTE — Progress Notes (Signed)
[place] for IV Heparin  Indication: RUE DVT associated with PICC  Patient Measurements: Height: 5\' 9"  (175.3 cm) Weight:  (bed scale not working) IBW/kg (Calculated) : 70.7 Heparin Dosing Weight: 123.4 kg   Labs: Recent Labs    03/10/22 0532 03/11/22 0757  HGB 7.9* 8.1*  HCT 26.6* 27.5*  PLT 206 197  HEPARINUNFRC 0.48 0.46  CREATININE 3.51* 3.69*   Estimated Creatinine Clearance: 44.8 mL/min (A) (by C-G formula based on SCr of 3.69 mg/dL (H)).  Medical History: Past Medical History:  Diagnosis Date   Diabetes mellitus without complication ([place])    Hypertension    Pancreatitis    Assessment: Patient is a 46 y/o M with medical history including DM, HTN, pancreatitis, tobacco use disorder, systolic CHF, OSA, DM c/b diabetic neuropathy, lumbar radiculopathy, morbid obesity who is admitted with acute respiratory failure in setting of acute CHF, hypertensive urgency, and pneumonia. Patient with prolonged admission on ventilator complicated by acute renal failure.  Pharmacy consulted to initiate and manage IV heparin for RUE DVT.  Goal of Therapy:  Heparin level 0.3-0.7 units/ml Monitor platelets by anticoagulation protocol: Yes   Plan: heparin level remains therapeutic but borderline and trend is down increase heparin infusion rate slightly to 2900 units/hr recheck heparin level on 8/24 with AM labs Daily CBC while on IV heparin  Dallie Piles 03/12/2022,7:11 AM

## 2022-03-12 NOTE — Progress Notes (Signed)
Date of Admission:  02/11/2022     ID: Samuel Chavez is a 46 y.o. male Principal Problem:   Acute respiratory failure with hypoxia and hypercarbia (HCC) Active Problems:   Acute on chronic systolic CHF (congestive heart failure) (HCC)   Hypertensive urgency   Type 2 diabetes mellitus with peripheral neuropathy (HCC)   Acute heart failure with preserved ejection fraction (HCC)   Renal failure (ARF), acute on chronic (HCC)   Hospital-acquired pneumonia   ARDS (adult respiratory distress syndrome) (HCC)   Pressure injury of skin    Subjective None available   Medications:   carvedilol  3.125 mg Per Tube BID WC   Chlorhexidine Gluconate Cloth  6 each Topical Q0600   famotidine  20 mg Per Tube QHS   feeding supplement (PROSource TF)  90 mL Per Tube TID   fiber supplement (BANATROL TF)  60 mL Per Tube BID   free water  30 mL Per Tube Q4H   insulin aspart  0-20 Units Subcutaneous Q4H   insulin aspart  4 Units Subcutaneous Q4H   insulin detemir  22 Units Subcutaneous BID   ipratropium-albuterol  3 mL Nebulization Q6H   multivitamin  1 tablet Per Tube QHS   mupirocin ointment   Nasal BID   mouth rinse  15 mL Mouth Rinse Q2H   sevelamer carbonate  2.4 g Per Tube TID WC    Objective: Vital signs in last 24 hours: Patient Vitals for the past 24 hrs:  BP Temp Temp src Pulse Resp SpO2  03/12/22 1700 137/80 -- -- 81 -- 95 %  03/12/22 1600 (!) 142/79 -- -- 77 -- 96 %  03/12/22 1500 (!) 146/84 -- -- 81 -- 96 %  03/12/22 1400 138/82 -- -- 76 -- 96 %  03/12/22 1316 -- -- -- -- -- 97 %  03/12/22 1300 135/86 -- -- 75 -- 96 %  03/12/22 1200 132/77 98.1 F (36.7 C) Oral 74 -- 95 %  03/12/22 1131 -- -- -- -- -- 94 %  03/12/22 1100 131/75 -- -- 80 -- 95 %  03/12/22 1000 136/75 -- -- 83 -- 95 %  03/12/22 0900 (!) 147/88 -- -- 87 20 94 %  03/12/22 0812 -- -- -- -- -- 95 %  03/12/22 0800 (!) 141/85 -- -- 81 (!) 24 95 %  03/12/22 0733 (!) 144/84 98.9 F (37.2 C) Oral -- -- --   03/12/22 0600 (!) 145/83 -- -- 81 19 95 %  03/12/22 0500 -- -- -- -- (!) 25 --  03/12/22 0400 (!) 140/79 99.1 F (37.3 C) Oral 85 (!) 21 93 %  03/12/22 0300 135/84 -- -- 88 (!) 23 94 %  03/12/22 0255 -- -- -- -- -- 93 %  03/12/22 0200 138/84 -- -- 87 20 93 %  03/12/22 0100 (!) 143/98 -- -- 64 17 94 %  03/12/22 0000 139/74 99.3 F (37.4 C) Oral 87 (!) 26 94 %  03/11/22 2300 136/73 -- -- 83 (!) 24 94 %  03/11/22 2200 134/74 -- -- 80 (!) 24 93 %  03/11/22 2100 129/70 -- -- 80 (!) 21 93 %  03/11/22 2028 -- -- -- -- -- 94 %  03/11/22 2000 136/68 99.4 F (37.4 C) Oral 81 (!) 22 95 %  03/11/22 1900 131/70 -- -- 82 20 94 %  03/11/22 1800 128/71 -- -- 82 (!) 26 93 %  03/11/22 1719 -- -- -- -- -- 93 %  PHYSICAL EXAM:  General: opens eyes to calling his name Morbid obestiy Tracheostomy Lungs: b/l airentry Heart: s1s2 Abdomen: Soft, non-tender,not distended. Bowel sounds normal. No masses Extremities: severe edema of arms and legs Skin: No rashes or lesions. Or bruising Lymph: Cervical, supraclavicular normal. Neurologic: cannot be assessed Lab Results Recent Labs    03/11/22 0757 03/12/22 0709  WBC 7.7 7.0  HGB 8.1* 8.0*  HCT 27.5* 27.4*  NA 140 140  K 4.6 4.3  CL 102 104  CO2 28 29  BUN 89* 65*  CREATININE 3.69* 2.56*    C-Reactive Protein Recent Labs    03/10/22 0532 03/11/22 0757  CRP 1.7* 1.3*    Microbiology: Tracheal aspirate - culture-MRSA BC - 1 staph epi- contaminant    Assessment/Plan: Acute  respiratory failure with hypercapnia, and hypoxia- intubated on admisison in the ED and now has tracheostomy Contributing factors, OSA, COPD, CHF, obesity hypoventilation syndrome MRI no acute finding   Fever - has resolved completely - post tracheostomy-  Leucocytosis has resolved Treated for MRSA pneumonia and prevotella sinusitis      Anemia ? __AKI-on dialysis     Anasarca improving   _Chronic systolic heart failure- cardiology  following  Discussed the management with care team ID will sign off- call if needed

## 2022-03-12 NOTE — Evaluation (Signed)
Clinical/Bedside Swallow Evaluation Patient Details  Name: Samuel Chavez MRN: [id number] Date of Birth: [birth year]  Today's Date: 03/12/2022 Time: SLP Start Time (ACUTE ONLY): 1545 SLP Stop Time (ACUTE ONLY): 1640 SLP Time Calculation (min) (ACUTE ONLY): 55 min  Past Medical History:  Past Medical History:  Diagnosis Date   Diabetes mellitus without complication ([place])    Hypertension    Pancreatitis    Past Surgical History:  Past Surgical History:  Procedure Laterality Date   CHOLECYSTECTOMY     TRACHEOSTOMY TUBE PLACEMENT N/A 02/25/2022   Procedure: TRACHEOSTOMY;  Surgeon: Clyde Canterbury, MD;  Location: [facility] ORS;  Service: ENT;  Laterality: N/A;   HPI:  Pt is 86 YOM admitted for PNA, renail failure, AKI, hyperkalemia, CHF, Resp failure, sepsis, and is s/p trach placement on 02/25/22 -- #8 shiley XLT.   PMH includes: Morbid Obesity, ARDS, type 2 diabetes mellitus, hypertension, pancreatitis, ongoing tobacco abuse, systolic CHF, OSA, type 2 diabetes mellitus and diabetic neuropathy, lumbar radiculopathy.  Admitting DXs: Acute respiratory failure with hypoxia and hypercarbia. Prior to hospital admission, pt was Independent for mobility and ADLs. Pt lives with family, communication limited to yes/no with head nods 2/2 tracheostomy.   CXR on 8/16: mild pulmonary edema.    Assessment / Plan / Recommendation  Clinical Impression   Pt seen for assessment of swallowing; PRN ice chips in setting of tracheostomy, on vent support currently. RT is following w/ initiation of Trach Collar(TC) weans in small increments at a time beginning today.  NSG reported pt was eager to have ice chips. He is scheduled for a PEG placement on Friday, 8/235/23. Has been supported by NGT TFs until today per Dietician.   Pt has a Shiley #8, XLT cuffed trach w/ cuff inflated. He is on vent support. He his alert/awake; nods head to communicate via Y/Ns -- this appeared appropriate and consistent for questions asked; NSG  stated same during her interactions w/ him.  Pt is nonverbal. O2 sats 96%; RR 22-24; HR 78-81.  Pt explained general aspiration precautions and need for strict oral care prior to any po's including ice chips. Pt agreed to the need for following them especially sitting upright for all oral intake, oral care. Pt assisted w/ sitting up and using pillows behind the head for a more forward position.  Noted no changes in ANS presentation. Pt engaged in Oral Care following instructions for oral movements. Noted min increased saliva orally but No drooling occurred. Encouraged pt to lingual sweep and swallow more frequently. OM exam revealed a slight tongue forward position at rest intermittently. No unilateral weakness; lingual strength WFL, esp. protrusion. Slight degree of white patchiness on posterior tongue; NSG aware and monitoring.   Pt given trials of SINGLE ice chips w/ grossly adequate oral phase management for bolus control and mastication of ice chips. A-P transfer was timely followed by swallowing. Unable to palpate laryngeal excursion d/t trach apparatus. Pharyngeal swallow appeared timely to the oral phase management of bolus. Single swallows were noted. No decline in O2 sats nor change in HR/RR. Pt exhibited a cough b/t ice chip trials x1 but no other coughing as trials continued. Unable to assess vocal quality d/t tracheostomy, on vent. No decline in pt's overall presentation; breathing effort appeared to remain comfortable at his baseline prior to po's.   Discussed pt's presentation and risk for aspiration of ice chip/water in setting of tracheostomy, pulmonary decline, and acute illness w/ NSG, NP, and RT. Discussed POC for PRN ice chips  including oral care PRIOR and general aspiration precautions.   Recommended initiation of ice chips PRN w/ aspiration precautions; oral care. ST services will continue to f/u w/ pt as his medical status improves for readiness for upgrade of po's; for toleration  of ice chips. Will provide education on swallowing and aspiration precautions to pt/family while admitted. Team updated on above. Precautions posted at room.  SLP Visit Diagnosis: Dysphagia, oropharyngeal phase (R13.12) (tracheostomy; on vent)    Aspiration Risk  Mild aspiration risk;Moderate aspiration risk    Diet Recommendation   PRN ice chips following aspiration precautions; PRIOR oral care   Medication Administration: Via alternative means    Other  Recommendations Recommended Consults:  (Dietician following) Oral Care Recommendations: Oral care QID;Oral care prior to ice chip/H20;Staff/trained caregiver to provide oral care Other Recommendations:  (TBD)    Recommendations for follow up therapy are one component of a multi-disciplinary discharge planning process, led by the attending physician.  Recommendations may be updated based on patient status, additional functional criteria and insurance authorization.  Follow up Recommendations Long-term institutional care without follow-up therapy (TBD)      Assistance Recommended at Discharge Frequent or constant Supervision/Assistance  Functional Status Assessment Patient has had a recent decline in their functional status and demonstrates the ability to make significant improvements in function in a reasonable and predictable amount of time.  Frequency and Duration min 2x/week  2 weeks       Prognosis Prognosis for Safe Diet Advancement: Fair (-Good) Barriers to Reach Goals: Time post onset;Severity of deficits Barriers/Prognosis Comment: tracheostomy, on vent currently      Swallow Study   General Date of Onset: 02/11/22 HPI: Pt is 83 YOM admitted for PNA, renail failure, AKI, hyperkalemia, CHF, Resp failure, sepsis, and is s/p trach placement on 02/25/22 -- #8 shiley XLT.   PMH includes: Morbid Obesity, ARDS, type 2 diabetes mellitus, hypertension, pancreatitis, ongoing tobacco abuse, systolic CHF, OSA, type 2 diabetes mellitus  and diabetic neuropathy, lumbar radiculopathy.  Admitting DXs: Acute respiratory failure with hypoxia and hypercarbia. Prior to hospital admission, pt was Independent for mobility and ADLs. Pt lives with family, communication limited to yes/no with head nods 2/2 tracheostomy.   CXR on 8/16: mild pulmonary edema. Type of Study: Bedside Swallow Evaluation Previous Swallow Assessment: none Diet Prior to this Study: NPO (Plan is for IR G-tube placement on 03/14/22) Temperature Spikes Noted: No Respiratory Status: Trach;Ventilator Trach Size and Type: #8;Inflated;Extra long;Cuff History of Recent Intubation: Yes (post admit w/ tracheostomy on 02/25/22) Date extubated: 02/25/22 Behavior/Cognition: Alert;Cooperative;Pleasant mood;Requires cueing (nonverbal) Oral Cavity Assessment: Excessive secretions (min) Oral Care Completed by SLP: Yes Oral Cavity - Dentition: Adequate natural dentition (min poor status) Vision:  (n/a) Self-Feeding Abilities: Total assist (weak UEs) Patient Positioning: Upright in bed (head forward) Baseline Vocal Quality:  (nonverbal d/t trach/vent) Volitional Cough:  (coughed against vent x2 during session) Volitional Swallow: Able to elicit    Oral/Motor/Sensory Function Overall Oral Motor/Sensory Function: Within functional limits (no unilateral wweakness noted)   Ice Chips Ice chips: Within functional limits (grossly) Presentation: Spoon (fed; 15 trials) Other Comments: 1 cough against vent during po trials; no decline in status or presentation during ice chip trials.   Thin Liquid Thin Liquid: Not tested    Nectar Thick Nectar Thick Liquid: Not tested   Honey Thick Honey Thick Liquid: Not tested   Puree Puree: Not tested   Solid     Solid: Not tested  Orinda Kenner, MS, CCC-SLP Speech Language Pathologist Rehab Services; [place] [phone] (ascom) Cristine Daw 03/12/2022,5:02 PM

## 2022-03-12 NOTE — Progress Notes (Signed)
[facility]  PROGRESS NOTE   Subjective:   Patient seen and evaluated at bedside in ICU Alert, patient open eyes to name Patient is able to nod yes/no to simple questions Remains critically ill. Heparin drip remains in place Vent-FiO2 45%, PEEP 10 Tube feeds held  UOP 3.5L  Objective:  Vital signs: Blood pressure (!) 147/88, pulse 87, temperature 98.9 F (37.2 C), temperature source Oral, resp. rate 20, height 5\' 9"  (1.753 m), weight (!) 204 kg, SpO2 94 %.  Intake/Output Summary (Last 24 hours) at 03/12/2022 1045 Last data filed at 03/12/2022 0900 Gross per 24 hour  Intake 1151.56 ml  Output 3250 ml  Net -2098.44 ml    Filed Weights     Physical Exam: General:  No acute distress, resting comfortably  Head:  Normocephalic, atraumatic. Moist oral mucosal membranes  Eyes:  Anicteric  Lungs:   Vent assisted, tracheostomy in place  Heart:  Regular on monitor  Abdomen:   Soft, nontender,  Extremities: 1+ peripheral edema.  Neurologic: Opens eyes to sound.  Able to follow simple commands today.  Skin:  No lesions  Access: Right IJ temp cath placed on 0/35/46    Basic Metabolic Panel: Recent Labs  Lab 03/08/22 0517 03/09/22 0642 03/10/22 0532 03/11/22 0757 03/12/22 0709  NA 136 139 138 140 140  K 4.8 5.3* 4.2 4.6 4.3  CL 98 102 99 102 104  CO2 26 25 28 28 29   GLUCOSE 123* 140* 115* 108* 132*  BUN 97* 101* 79* 89* 65*  CREATININE 4.99* 5.26* 3.51* 3.69* 2.56*  CALCIUM 7.6* 8.0* 7.7* 7.9* 8.1*  MG 2.7* 2.8* 2.2 2.4 2.2  PHOS 10.0* 11.7* 8.3* 9.3*  9.2* 6.0*  6.1*     CBC: Recent Labs  Lab 03/08/22 0517 03/09/22 0642 03/10/22 0532 03/11/22 0757 03/12/22 0709  WBC 9.2 7.9 8.1 7.7 7.0  NEUTROABS 6.8 5.7 6.1 5.6 5.0  HGB 8.5* 8.4* 7.9* 8.1* 8.0*  HCT 28.2* 28.7* 26.6* 27.5* 27.4*  MCV 87.6 89.1 90.8 91.7 90.1  PLT 249 224 206 197 198      Urinalysis: No results for input(s): "COLORURINE", "LABSPEC", "PHURINE", "GLUCOSEU", "HGBUR",  "BILIRUBINUR", "KETONESUR", "PROTEINUR", "UROBILINOGEN", "NITRITE", "LEUKOCYTESUR" in the last 72 hours.  Invalid input(s): "APPERANCEUR"     Imaging: No results found.   Medications:    sodium chloride 250 mL (03/03/22 2253)   anticoagulant sodium citrate     anticoagulant sodium citrate     feeding supplement (VITAL 1.5 CAL) 70 mL/hr at 03/12/22 0807   heparin 2,800 Units/hr (03/12/22 0807)    carvedilol  3.125 mg Per Tube BID WC   Chlorhexidine Gluconate Cloth  6 each Topical Q0600   famotidine  20 mg Per Tube QHS   feeding supplement (PROSource TF)  90 mL Per Tube TID   fiber supplement (BANATROL TF)  60 mL Per Tube BID   free water  30 mL Per Tube Q4H   insulin aspart  0-20 Units Subcutaneous Q4H   insulin aspart  4 Units Subcutaneous Q4H   insulin detemir  22 Units Subcutaneous BID   ipratropium-albuterol  3 mL Nebulization Q6H   multivitamin  1 tablet Per Tube QHS   mupirocin ointment   Nasal BID   mouth rinse  15 mL Mouth Rinse Q2H   sevelamer carbonate  2.4 g Per Tube TID WC    Assessment/ Plan:     46 y.o. male with medical problems of poorly controlled diabetes, hypertension, systolic CHF, obstructive  sleep apnea, obesity    admitted on 02/11/2022 for  Principal Problem:   Acute respiratory failure with hypoxia and hypercarbia (HCC) Active Problems:   Acute on chronic systolic CHF (congestive heart failure) (HCC)   Hypertensive urgency   Type 2 diabetes mellitus with peripheral neuropathy (HCC)   Acute heart failure with preserved ejection fraction (HCC)   Renal failure (ARF), acute on chronic (HCC)   Hospital-acquired pneumonia   ARDS (adult respiratory distress syndrome) (HCC)   Pressure injury of skin    # Acute kidney injury: Acute kidney injury is most likely secondary to acute tubular necrosis possibly due to hypotension, fever and sepsis.  Patient became oligoanuric with metabolic acidosis and hyperkalemia.   -Received dialysis yesterday, no UF -  UOP 3.5L -Bun and creatinine improved with dialysis -Will order 24 hour creatinine clearance to determine renal function and need for permcath.   # Hyperphosphatemia - Continue renvela powder as phosphorus binders with the tube feeds. -Phosphorus improved to 6.0, continue sevelamer and dialysis as scheduled.   # Hyperkalemia:  -Potassium 4.3 today, corrected with dialysis  #Chronic systolic CHF with volume overload 2D echo February 16, 2022-LVEF 40 to 45%, mild concentric LVH moderately enlarged right ventricle, moderately dilated left atrium and right atrium.   #4  Acute hypoxic Vent dependent respiratory failure:  Now with tracheostomy. Vent assisted   #5 Sepsis: Management as per ICU and ID teams. - completed linezolid and Augmentin.      LOS: [facility] 8/23/202310:45 AM

## 2022-03-12 NOTE — Consult Note (Signed)
PHARMACY CONSULT NOTE  Pharmacy Consult for Electrolyte Monitoring and Replacement   Recent Labs: Potassium (mmol/L)  Date Value  03/12/2022 4.3   Magnesium (mg/dL)  Date Value  03/12/2022 2.2   Calcium (mg/dL)  Date Value  03/12/2022 8.1 (L)   Albumin (g/dL)  Date Value  03/12/2022 2.2 (L)   Phosphorus (mg/dL)  Date Value  03/12/2022 6.0 (H)  03/12/2022 6.1 (H)   Sodium (mmol/L)  Date Value  03/12/2022 140   Assessment: Patient is a 46 y/o M with medical history including DM, HTN, pancreatitis, tobacco use disorder, systolic CHF, OSA, DM c/b diabetic neuropathy, lumbar radiculopathy, morbid obesity who is admitted with acute respiratory failure in setting of acute CHF and hypertensive urgency. Patient is currently intubated, sedated, and on mechanical ventilation in the ICU. Pharmacy consulted to assist with electrolyte monitoring and replacement as indicated.  Nutrition: Tube feeds at 70 mL/hr (~ 1.68 L/day) + free water 30 mL q4h (180 mL/day)   Goal of Therapy:  Electrolytes within normal limits  Plan:  --phosphorous elevated in setting of renal dysfunction, nephrology and PCCM following.  --Follow-up electrolytes with AM labs tomorrow  Dallie Piles  03/12/2022 7:59 AM

## 2022-03-12 NOTE — Progress Notes (Signed)
NAME:  Samuel Chavez, MRN:  [id number], DOB:  [birth year], LOS: 72 ADMISSION DATE:  02/11/2022  History of Present Illness:  46 y.o male with significant PMH as below who presented to the ED with chief complaints of SOB and worsening bilateral lower extremities edema.   ED Course: In the emergency department, the temperature was 37.3C, the heart rate 99 beats/minute, the blood pressure 187/102 mm Hg, the respiratory rate 20 breaths/minute, and the oxygen saturation 89% on. He was noted to be drowsy but still protecting his airway and responding appropriately.   Pertinent  Medical History   Past Medical History:  Diagnosis Date   Diabetes mellitus without complication ([place])    Hypertension    Pancreatitis     Significant Hospital Events: Including procedures, antibiotic start and stop dates in addition to other pertinent events   7/25: Admitted to hospitalist service w/acute on chronic hypoxic hypercapnic resp. failure requiring BiPAP.Failed BiPAP and intubated. PCCM consulted 7/26: INTUBATED, difficlut to sedate, started [place] 7/27: severe hypoxia, PEEP increased to 15 but decreased back to 10 7/28: Persistent severe hypoxia, ventilator changes made 7/29: Persistent hypoxia, recruitment maneuvers instituted, ventilator changes made, empiric heparin as patient cannot be scanned query PE -02/17/22- patient is severely critically ill with bilateral multifocal pneumonia on sedation and paralysis with mechanical ventilation maximal settings.  He is at cusp of death, we met with daughter today and discussed severity of illness. Unable to perform CT due to severity of critical illness. Met with previous PCCM doc and discussed medical plan.  Patient had recruitment maneuvers last few days without improvement, on heparin gtt for possible PE.  02/18/22- patient continues to require maximal settings on ventilator despite good UOP.  He destaturated to <50% spO2 on MV today required BGV.    02/19/22- patient continues to require 100% FiO2 on maximal setting with high PEEP ladder for possible ARDS.  He was diuresed and on steroids with development of AKI.  His CXR had slight interval improvement on right. We discussed case with vascular surgery regarding possible empiric tPA for PE.  02/20/22- patient was unable to get CT today due to continued severe hypoxemia.  Daughter and other family members at bedside we reviewed case and medical plan.  There seems to be some confusion from family as they had asked me to wake patient up and take off ventilator even though I had explained multiple times that he is at very high risk for death.  They laughed at my comments and seemed to think it was not true. We may still be able to do bronchoscopy but its high risk.  We have reduced IV infusions by switching some medications to OGT route.  02/21/22- patient is weaned to 60%FiO2.  Plan for possible bronch if patient is tolerating.  Family at bedside.  If able to we will obtain more imaging and VQ scan to rule out PE.  02/22/22- Patient weaned to 50% on PRVC.  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 CBC stable , CMP with improved GFR. Monitoring Na, pharmacy and renal following for electrolytes/renal function.  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  Overall marked improvement on ventilator over past 48h. Met with daughter at bedside reviewed medical plan.  02/24/22-Vent requirements continue to slowly improve, currently 40% FiO2 and 14 PEEP.  AKI continues to slowly improve, remains hyperkalemic with potassium of 5.8.  Placed back on  Veltassa, Diurese x1.  Trach scheduled for tomorrow at 1:15 PM 02/25/22- Diurese with Lasix x1 dose. Trach scheduled for today. 02/26/22: Remains mechanically ventilated via newly placed tracheostomy will attempt to wean ventilator settings today.  Perform WUA  02/26/22: Pt remains mechanically  ventilated via tracheostomy vent settings: PEEP 14/FiO2 80% 02/27/22: Fevers, ID consulted. Repeating Tracheal aspirate and UA.  Zosyn started 02/28/22: Persistent fevers, Tracheal aspirate with Staph aureus, start Linezolid.  Worsening Creatinine, considering Lasix gtt given high vent requirements (100% FiO2, 14 peep).  D/c fentanyl gtt and decrease oral benzos/narcotics 03/01/22: worsening anuric renal failure. HD line placed and started on hemodialysis. 03/02/22: Vent support slowly improving. Plan for HD again today. 8/14-8/15: remains febrile, encephalopathic despite HD sessions 8/16 fever curve improved, clots noted in LUE 8/18: continued low grade fever (t max 100.6).  Receiving HD.  Slight improvement in encephalopathy 8/19 off pressors, off sedation, remains on vent  8/20: Afebrile, tolerating PSV 8/21: Remains afebrile, obtain CT Abdomen for IR to evaluate for PEG placement.  Tolerating PSV (15/5).  Consult PT for passive ROM 8/22: Extremely weak, but able to MAE to command, nodding to questions. Tolerating PSV, will trial TC. Deemed not candidate for IR placement of PEG, will have General Surgery evaluate  Consults:  PCCM Nephrology Vascular Surgery Palliative Care ENT Infectious Disease  Procedures:  7/25: Intubation 7/26: PICC triple-lumen, right basilic vein 9/32: Left radial arterial line  8/08: Size 8 proximal XLT Shiley inserted 8/12: Right IJ HD catheter placed  Significant Diagnostic Tests:  7/25  Chest Xray:Chest x-ray showed cardiomegaly and pulmonary vascular congestion without overt pulmonary edema 7/25 Echocardiogram: difficult study, LVEF was estimated at 55 to 60%, dilated LV moderate LVH, grade 1 DD, enlargement of the right ventricle, left atrial size moderately dilated, right atrial size moderately dilated this is consistent with restrictive physiology (echo reading not congruous with prior echoes from [facility]) 7/29: Venous US BLE>>IMPRESSION: No  lower extremity DVT. 7/30 Limited echo: LVEF 40 to 45% decreased LV function, no wall motion abnormalities, moderate dilation of the LV concentric LVH, right ventricular size moderately enlarged, right atrial enlargement, this is more consistent with prior [facility] scans 8/4: CT Head>>No acute intracranial abnormality. Paranasal sinusitis. Bilateral complete opacification of the mastoid air cells and inner ears, as can be seen in the setting of otitis media. Correlate with symptoms. 8/4: CT Chest/Abdomen/Pelvis>>Extensive ground-glass, alveolar and patchy dense infiltrates in both lungs suggesting multifocal pneumonia. Part of this finding may suggest underlying pulmonary edema. Small bilateral pleural effusions are seen. Cardiomegaly. There is ectasia of the main pulmonary artery suggesting pulmonary arterial hypertension. There is no evidence of intestinal obstruction or pneumoperitoneum. There is no hydronephrosis. Appendix is not dilated. UB diverticula are seen in the colon without signs of focal diverticulitis. Severe degenerative changes are noted at L4-L5 level with significant interval worsening. Findings may be due to severe disc degeneration. If there is clinical suspicion for discitis or osteomyelitis, follow-up MRI may be considered 8/4: Lung V/Q>>Pulmonary embolism absent. 8/16: RUE brachial and axillary vein DVT associated with PICC line 8/22: General Surgery consulted tentatively scheduled for G tube placement on 08/25 8/23: No acute events overnight tolerating SBT 10/5 will attempt to transition to TCT today   Micro Data:  7/25: SARS-CoV-2 PCR> negative 7/25: MRSA PCR>> NEG 7/27: Strep pneumoniae Ag>> negative 7/27: Legionella Ag>> negative 7/27: Mycoplasma>> <770 7/27: Sputum>> Strep pneumo,Haemophilus influenzae 7/30 Sputum cult >> negative 8/4: Tracheal aspirate>>negative 8/9: Tracheal aspirate>>MRSA 8/10: RVP>>negative 8/10: Right  nasal Wound  culture>>STAPHYLOCOCCUS AUREUS, PROVOTELLA 8/10: MRSA PCR>> Positive 8/11: Blood culture 1/4>>Staphylococcus epidermidis MecA/C (suspect contaminant)  Antimicrobials:  Cefepime 7/27 >>7/31 Ceftriaxone 7/31>>8/4 Vancomycin 7/27 >> 7/29, restarted 7/30 (resumed due to fever spike on Maxipime) ? Resistant Strep pneumo >> 7/31 Unasyn 8/14>>8/17 Augmentin 8/17>>8/21 Linezolid 8/11>>8/20  Interim History / Subjective:  As outlined above under significant events   Objective   Blood pressure (!) 144/84, pulse 81, temperature 98.9 F (37.2 C), temperature source Oral, resp. rate 19, height _0  (1.753 m), weight (!) 204 kg, SpO2 95 %.    Vent Mode: PSV FiO2 (%):  [45 %-60 %] 45 % Pressure Support:  [10 cmH20] 10 cmH20   Intake/Output Summary (Last 24 hours) at 03/12/2022 0756 Last data filed at 03/12/2022 0700 Gross per 24 hour  Intake 758.63 ml  Output 3700 ml  Net -2941.37 ml   Filed Weights    Examination: GENERAL: Acute on chronically ill appearing male, awake, on vent, in NAD HENT: Atraumatic, normocephalic, neck supple, 8 XLT Tracheostomy dressing clean, dry and intact LUNGS: Rhonchi throughout, even, non labored  CV: NSR, RRR, s1s2, no M/R/G, 2+ generalized edema  ABDOMEN: Obese, soft, nontender, no guarding or rebound tenderness, BS+ x4,  flexiseal draining liquid brown stool  MUSCULOSKELETAL: Generalized weakness, moves all extremities  NEURO:  Alert, following commands, PERRL  Resolved Hospital Problem list   Community-acquired pneumonia: Streptococcus pneumoniae and Haemophilus influenzae ~ TREATED MRSA HCAP ~ TREATED Provetella Sinusitis ~ TREATED Hypernatremia  Assessment & Plan:  Acute on chronic hypoxemic and hypercapneic respiratory failure now s/p tracheostomy PMHx: OSA/OHS - Full vent support, implement lung protective strategies - Plateau pressures less than 30 cm H20 - Wean FiO2 & PEEP as tolerated to maintain O2 sats >92% - Follow intermittent Chest  X-ray & ABG as needed - SBT or TCT as tolerated  - Implement VAP Bundle - Prn Bronchodilators  MRSA HCAP- completed linezolid x 7 days Provetella sinusitis- completed unasyn/augmentin Persistent low grade fever, suspect due to RUE DVT ~ RESOLVED - Trend WBC and monitor fever curve  - ID following, appreciate input   Acute renal failure with hyperkalemia now on iHD - Trend BMP - Replace electrolytes as indicated - Monitor UOP: improving  - Nephrology consulted appreciate input: HD per recommendations   - May need to consider Permcath placement  DM2 with hyperglycemia - CBG's q4h; Target range of 140 to 180 - SSI & Semglee - Follow ICU Hypo/Hyperglycemia protocol  Ongoing severe toxic encephalopathy- 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  Still uremic.  EEG 8/16 neg.  - Avoid sedating medications as able - PT/OT following - Mobilize as able  PICC associated DVT- PICC removed - Heparin gtt - Once PEG completed, can transition to [place] (right click and "Reselect all SmartList Selections" daily)   Diet/type: tubefeeds DVT prophylaxis: systemic heparin GI prophylaxis: PPI Lines: HD catheter Foley:  Out Code Status:  full code Last date of multidisciplinary goals of care discussion [03/12/22]  Long term prognosis guarded  Critical Care Time: 32 minutes  Donell Beers, [place] Pager [phone] (please enter 7 digits) PCCM Consult Pager [phone] (please enter 7 digits)

## 2022-03-12 NOTE — TOC Progression Note (Signed)
Transition of Care [facility]) - Progression Note    Patient Details  Name: Samuel Chavez MRN: [id number] Date of Birth: 01-Mar-1976  Transition of Care [facility]) CM/SW Contact  Shelbie Hutching, RN Phone Number: 03/12/2022, 4:14 PM  Clinical Narrative:    Samuel Chavez with [facility] will reach out again to their facility in [place] to see if they may be able to accept patient.     Expected Discharge Plan:  (TBD) Barriers to Discharge: Continued Medical Work up  Expected Discharge Plan and Services Expected Discharge Plan:  (TBD)   Discharge Planning Services: CM Consult   Living arrangements for the past 2 months: Single Family Home                                       Social Determinants of Health (SDOH) Interventions    Readmission Risk Interventions     No data to display

## 2022-03-12 NOTE — Progress Notes (Signed)
Updated pts daughter Letta Kocher via telephone regarding pts condition and plan of care.  Ms. Sherral Hammers was appreciative to receive an update. Will continue to monitor and assess pt.   Donell Beers, [place] Pager [phone] (please enter 7 digits) PCCM Consult Pager [phone] (please enter 7 digits)

## 2022-03-13 DIAGNOSIS — J9601 Acute respiratory failure with hypoxia: Secondary | ICD-10-CM | POA: Diagnosis not present

## 2022-03-13 DIAGNOSIS — J9602 Acute respiratory failure with hypercapnia: Secondary | ICD-10-CM | POA: Diagnosis not present

## 2022-03-13 LAB — GLUCOSE, CAPILLARY
Glucose-Capillary: 100 mg/dL — ABNORMAL HIGH (ref 70–99)
Glucose-Capillary: 83 mg/dL (ref 70–99)
Glucose-Capillary: 83 mg/dL (ref 70–99)
Glucose-Capillary: 84 mg/dL (ref 70–99)
Glucose-Capillary: 91 mg/dL (ref 70–99)
Glucose-Capillary: 91 mg/dL (ref 70–99)
Glucose-Capillary: 94 mg/dL (ref 70–99)

## 2022-03-13 LAB — RENAL FUNCTION PANEL
Albumin: 2.3 g/dL — ABNORMAL LOW (ref 3.5–5.0)
Anion gap: 10 (ref 5–15)
BUN: 63 mg/dL — ABNORMAL HIGH (ref 6–20)
CO2: 27 mmol/L (ref 22–32)
Calcium: 8.3 mg/dL — ABNORMAL LOW (ref 8.9–10.3)
Chloride: 106 mmol/L (ref 98–111)
Creatinine, Ser: 2.65 mg/dL — ABNORMAL HIGH (ref 0.61–1.24)
GFR, Estimated: 30 mL/min — ABNORMAL LOW (ref >60)
Glucose, Bld: 93 mg/dL (ref 70–99)
Phosphorus: 6.4 mg/dL — ABNORMAL HIGH (ref 2.5–4.6)
Potassium: 4.4 mmol/L (ref 3.5–5.1)
Sodium: 143 mmol/L (ref 135–145)

## 2022-03-13 LAB — CBC WITH DIFFERENTIAL/PLATELET
Abs Immature Granulocytes: 0.01 10*3/uL (ref 0.00–0.07)
Basophils Absolute: 0 10*3/uL (ref 0.0–0.1)
Basophils Relative: 0 %
Eosinophils Absolute: 0.5 10*3/uL (ref 0.0–0.5)
Eosinophils Relative: 8 %
HCT: 28 % — ABNORMAL LOW (ref 39.0–52.0)
Hemoglobin: 8.1 g/dL — ABNORMAL LOW (ref 13.0–17.0)
Immature Granulocytes: 0 %
Lymphocytes Relative: 15 %
Lymphs Abs: 1 10*3/uL (ref 0.7–4.0)
MCH: 26.8 pg (ref 26.0–34.0)
MCHC: 28.9 g/dL — ABNORMAL LOW (ref 30.0–36.0)
MCV: 92.7 fL (ref 80.0–100.0)
Monocytes Absolute: 0.7 10*3/uL (ref 0.1–1.0)
Monocytes Relative: 11 %
Neutro Abs: 4.4 10*3/uL (ref 1.7–7.7)
Neutrophils Relative %: 66 %
Platelets: 200 10*3/uL (ref 150–400)
RBC: 3.02 MIL/uL — ABNORMAL LOW (ref 4.22–5.81)
RDW: 16.2 % — ABNORMAL HIGH (ref 11.5–15.5)
WBC: 6.7 10*3/uL (ref 4.0–10.5)
nRBC: 0 % (ref 0.0–0.2)

## 2022-03-13 LAB — CREATININE CLEARANCE, URINE, 24 HOUR
Collection Interval-CRCL: 24 hours
Creatinine Clearance: 40 mL/min — ABNORMAL LOW (ref 75–125)
Creatinine, 24H Ur: 1530 mg/d (ref 800–2000)
Creatinine, Urine: 51 mg/dL
Urine Total Volume-CRCL: 3000 mL

## 2022-03-13 LAB — C-REACTIVE PROTEIN: CRP: 1.3 mg/dL — ABNORMAL HIGH (ref <1.0)

## 2022-03-13 LAB — PHOSPHORUS: Phosphorus: 6.3 mg/dL — ABNORMAL HIGH (ref 2.5–4.6)

## 2022-03-13 LAB — MAGNESIUM: Magnesium: 2.2 mg/dL (ref 1.7–2.4)

## 2022-03-13 LAB — HEPARIN LEVEL (UNFRACTIONATED): Heparin Unfractionated: 0.51 IU/mL (ref 0.30–0.70)

## 2022-03-13 NOTE — Progress Notes (Signed)
[facility] for IV Heparin  Indication: RUE DVT associated with PICC  Patient Measurements: Height: 5\' 9"  (175.3 cm) Weight:  (bed scale not working) IBW/kg (Calculated) : 70.7 Heparin Dosing Weight: 123.4 kg   Labs: Recent Labs    03/11/22 0757 03/12/22 0709 03/13/22 0339 03/13/22 0348  HGB 8.1* 8.0* 8.1*  --   HCT 27.5* 27.4* 28.0*  --   PLT 197 198 200  --   HEPARINUNFRC 0.46 0.30 0.51  --   CREATININE 3.69* 2.56*  --  2.65*   Estimated Creatinine Clearance: 62.4 mL/min (A) (by C-G formula based on SCr of 2.65 mg/dL (H)).  Medical History: Past Medical History:  Diagnosis Date   Diabetes mellitus without complication ([place])    Hypertension    Pancreatitis    Assessment: Patient is a 46 y/o M with medical history including DM, HTN, pancreatitis, tobacco use disorder, systolic CHF, OSA, DM c/b diabetic neuropathy, lumbar radiculopathy, morbid obesity who is admitted with acute respiratory failure in setting of acute CHF, hypertensive urgency, and pneumonia. Patient with prolonged admission on ventilator complicated by acute renal failure.  Pharmacy consulted to initiate and manage IV heparin for RUE DVT.  Goal of Therapy:  Heparin level 0.3-0.7 units/ml Monitor platelets by anticoagulation protocol: Yes   Plan: heparin level remains therapeutic Continue heparin infusion rate at 2900 units/hr recheck heparin level on 8/25 with AM labs Daily CBC while on IV heparin  Renda Rolls, PharmD, [facility] 03/13/2022 5:15 AM

## 2022-03-13 NOTE — Consult Note (Signed)
PHARMACY CONSULT NOTE  Pharmacy Consult for Electrolyte Monitoring and Replacement   Recent Labs: Potassium (mmol/L)  Date Value  03/13/2022 4.4   Magnesium (mg/dL)  Date Value  03/13/2022 2.2   Calcium (mg/dL)  Date Value  03/13/2022 8.3 (L)   Albumin (g/dL)  Date Value  03/13/2022 2.3 (L)   Phosphorus (mg/dL)  Date Value  03/13/2022 6.4 (H)   Sodium (mmol/L)  Date Value  03/13/2022 143   Assessment: Patient is a 46 y/o M with medical history including DM, HTN, pancreatitis, tobacco use disorder, systolic CHF, OSA, DM c/b diabetic neuropathy, lumbar radiculopathy, morbid obesity who is admitted with acute respiratory failure in setting of acute CHF and hypertensive urgency. Pharmacy consulted to assist with electrolyte monitoring and replacement as indicated.  Goal of Therapy:  Electrolytes within normal limits  Plan:  --no electrolyte replacement warranted for today  --Follow-up electrolytes with AM labs tomorrow  Dallie Piles  03/13/2022 6:57 AM

## 2022-03-13 NOTE — Progress Notes (Signed)
Physical Therapy Treatment Patient Details Name: Samuel Chavez MRN: [id number] DOB: [birth year] Today's Date: 03/13/2022   History of Present Illness Pt is 50 YOM admitted for PNA, renail failure, AKI, hyperkalemia, CHF, Resp failure, sepsis, and is s/p trach placement. PMH includes: DM, HTN, sCHF, OSA, obesity, ARDS Resp failure, sepsis.    PT Comments    Patient received in bed, alert, nods yes/no to questions. He is able to assist more with LE exercises this session. Improving. Continues to be very weak and will require lift for oob at this time. Will continue to benefit from skilled PT to improve mobility, strength and independence.       Recommendations for follow up therapy are one component of a multi-disciplinary discharge planning process, led by the attending physician.  Recommendations may be updated based on patient status, additional functional criteria and insurance authorization.  Follow Up Recommendations  Skilled nursing-short term rehab (<3 hours/day) Can patient physically be transported by private vehicle: No   Assistance Recommended at Discharge Frequent or constant Supervision/Assistance  Patient can return home with the following Two people to help with walking and/or transfers;Two people to help with bathing/dressing/bathroom;Direct supervision/assist for medications management;Help with stairs or ramp for entrance;Assist for transportation;Assistance with feeding;Assistance with cooking/housework;Direct supervision/assist for financial management   Equipment Recommendations  None recommended by PT    Recommendations for Other Services       Precautions / Restrictions Precautions Precautions: Fall Precaution Comments: contact precautions, R IJ temp cath Restrictions Weight Bearing Restrictions: No Other Position/Activity Restrictions: HOB 30 deg     Mobility  Bed Mobility Overal bed mobility: Needs Assistance Bed Mobility: Rolling Rolling: +2 for  physical assistance, Total assist         General bed mobility comments: total assist    Transfers                        Ambulation/Gait               General Gait Details: unable to attempt d/t safety/weakness   Stairs             Wheelchair Mobility    Modified Rankin (Stroke Patients Only)       Balance                                            Cognition Arousal/Alertness: Awake/alert Behavior During Therapy: Flat affect Overall Cognitive Status: Difficult to assess                                 General Comments: pt shakes and nods heads to asnwer yes/no questions        Exercises Other Exercises Other Exercises: B hip internal rotation, AP, Heel slides, SAQ  10 reps AA exercises this date, improved ability to assist from yesterday. Positioned L LE in more neutral position as he keeps this leg externally rotated in bed.    General Comments        Pertinent Vitals/Pain Pain Assessment Pain Assessment: Faces Facial Expression: facial grimacing Pain Descriptors / Indicators: Grimacing Pain Intervention(s): Monitored during session, Repositioned    Home Living  Prior Function            PT Goals (current goals can now be found in the care plan section) Acute Rehab PT Goals PT Goal Formulation: Patient unable to participate in goal setting Progress towards PT goals: Progressing toward goals    Frequency    Min 2X/week      PT Plan Current plan remains appropriate    Co-evaluation              AM-PAC PT "6 Clicks" Mobility   Outcome Measure  Help needed turning from your back to your side while in a flat bed without using bedrails?: Total Help needed moving from lying on your back to sitting on the side of a flat bed without using bedrails?: Total Help needed moving to and from a bed to a chair (including a wheelchair)?: Total Help needed  standing up from a chair using your arms (e.g., wheelchair or bedside chair)?: Total Help needed to walk in hospital room?: Total Help needed climbing 3-5 steps with a railing? : Total 6 Click Score: 6    End of Session Equipment Utilized During Treatment: Oxygen Activity Tolerance: Patient tolerated treatment well Patient left: in bed Nurse Communication: Mobility status PT Visit Diagnosis: Muscle weakness (generalized) (M62.81);Other abnormalities of gait and mobility (R26.89)     Time: 1034-1050 PT Time Calculation (min) (ACUTE ONLY): 16 min  Charges:  $Therapeutic Exercise: 8-22 mins                     Gennell How, PT, GCS 03/13/22,11:16 AM

## 2022-03-13 NOTE — Progress Notes (Signed)
Nutrition Follow Up Note   DOCUMENTATION CODES:   Morbid obesity  INTERVENTION:   Once G-tube in place and ready for use:  Continue Vital 1.5 _0 /hr + ProSource TF 21m TID via tube  Free water flushes 364mq4 hours   Regimen provides 2760kcal/day, 180g/day protein and 146368may of free water   Rena-vit daily via tube   Juven Fruit Punch BID, each serving provides 95kcal and 2.5g of protein (amino acids glutamine and arginine)  NUTRITION DIAGNOSIS:   Inadequate oral intake related to inability to eat (pt sedated and ventilated) as evidenced by NPO status.  GOAL:   Provide needs based on ASPEN/SCCM guidelines -previously met   MONITOR:   Vent status, Labs, Weight trends, TF tolerance, Skin, I & O's  ASSESSMENT:   [age]o male with h/o COPD, CHF, HTN, DM and OSA who is admitted with CHF exacerbation, CAP and severe ARDS.  Pt s/p tracheostomy and NGT placement 8/8 Pt s/p new HD 8/12  Pt more alert today. Pt currently undergoing trach collar trials. NGT clogged and was removed. Pt NPO for surgical G-tube placement tomorrow. Pt has been having ice chips with no issues. Will resume tube feeds once ok per surgery. Last HD on Tuesday; holding HD for now. Per chart, pt remains up ~73lbs from his UBW but appears weight stable for the past two weeks. Pt +3.9L on his I & Os. Edema improved.   Medications reviewed and include: insulin, rena-vit, renvela, heparin   Labs reviewed: K 4.4 wnl, BUN 63(H), creat 2.65(H), P 6.4(H), Mg 2.2 wnl Hgb 8.1(L), Hct 28.0(L) Cbgs- 100, 91, 94, 83 x 24 hrs  Patient is currently intubated on ventilator support MV: 11.0 L/min Temp (24hrs), Avg:99.1 F (37.3 C), Min:98.3 F (36.8 C), Max:99.3 F (37.4 C)  Propofol: none  MAP- >77m64m  UOP- 2450ml2miet Order:   Diet Order             Diet NPO time specified  Diet effective midnight                  EDUCATION NEEDS:   Not appropriate for education at this time  Skin:   Skin Assessment: Reviewed RN Assessment (Pressure Injury: right ischium)  Last BM:  8/24- type 6  Height:   Ht Readings from Last 1 Encounters:  02/28/22 _1  (1.753 m)    Weight:   Wt Readings from Last 1 Encounters:  03/12/18 (!) 154.2 kg    Ideal Body Weight:  72.7 kg  BMI:  Body mass index is 66.41 kg/m.  Estimated Nutritional Needs:   Kcal:  2700-3000kcal/day  Protein:  182g/day protein  Fluid:  2.0L/day  CaseyKoleen DistanceRD, LDN Please refer to [facility] and/or RD on-call/weekend/after hours pager

## 2022-03-13 NOTE — Progress Notes (Signed)
Pt placed on 50% Trach Collar, tolerating well at this time, sats 97%, respiratory rate 18/min. RN notified. Will continue to monitor.

## 2022-03-13 NOTE — Progress Notes (Signed)
[facility]  PROGRESS NOTE   Subjective:   Patient seen and evaluated at bedside in ICU Alert and tracking in room Responding to simple questions with nod and following simple commands.  Remains critically ill. Heparin drip remains in place Vent-FiO2 45%, PEEP 10 Tube feeds held awaiting Gtube placement  UOP 2.45L  Objective:  Vital signs: Blood pressure (!) 154/93, pulse 73, temperature 98.8 F (37.1 C), temperature source Oral, resp. rate 15, height 5\' 9"  (1.753 m), weight (!) 204 kg, SpO2 93 %.  Intake/Output Summary (Last 24 hours) at 03/13/2022 1101 Last data filed at 03/13/2022 4496 Gross per 24 hour  Intake 773.75 ml  Output 2200 ml  Net -1426.25 ml    Filed Weights     Physical Exam: General:  No acute distress, resting comfortably  Head:  Normocephalic, atraumatic. Moist oral mucosal membranes  Eyes:  Anicteric  Lungs:   Vent assisted, tracheostomy in place  Heart:  Regular on monitor  Abdomen:   Soft, nontender,  Extremities: 1+ peripheral edema.  Neurologic: Eyes open and tracking.  Able to follow simple commands.  Skin:  No lesions  Access: Right IJ temp cath placed on 7/59/16    Basic Metabolic Panel: Recent Labs  Lab 03/09/22 3846 03/10/22 0532 03/11/22 0757 03/12/22 0709 03/13/22 0339 03/13/22 0348  NA 139 138 140 140  --  143  K 5.3* 4.2 4.6 4.3  --  4.4  CL 102 99 102 104  --  106  CO2 25 28 28 29   --  27  GLUCOSE 140* 115* 108* 132*  --  93  BUN 101* 79* 89* 65*  --  63*  CREATININE 5.26* 3.51* 3.69* 2.56*  --  2.65*  CALCIUM 8.0* 7.7* 7.9* 8.1*  --  8.3*  MG 2.8* 2.2 2.4 2.2 2.2  --   PHOS 11.7* 8.3* 9.3*  9.2* 6.0*  6.1* 6.3* 6.4*     CBC: Recent Labs  Lab 03/09/22 0642 03/10/22 0532 03/11/22 0757 03/12/22 0709 03/13/22 0339  WBC 7.9 8.1 7.7 7.0 6.7  NEUTROABS 5.7 6.1 5.6 5.0 4.4  HGB 8.4* 7.9* 8.1* 8.0* 8.1*  HCT 28.7* 26.6* 27.5* 27.4* 28.0*  MCV 89.1 90.8 91.7 90.1 92.7  PLT 224 206 197 198 200       Urinalysis: No results for input(s): "COLORURINE", "LABSPEC", "PHURINE", "GLUCOSEU", "HGBUR", "BILIRUBINUR", "KETONESUR", "PROTEINUR", "UROBILINOGEN", "NITRITE", "LEUKOCYTESUR" in the last 72 hours.  Invalid input(s): "APPERANCEUR"     Imaging: No results found.   Medications:    sodium chloride 250 mL (03/03/22 2253)   anticoagulant sodium citrate     anticoagulant sodium citrate     heparin 2,900 Units/hr (03/13/22 0917)    Chlorhexidine Gluconate Cloth  6 each Topical Q0600   insulin aspart  0-20 Units Subcutaneous Q4H   insulin aspart  4 Units Subcutaneous Q4H   insulin detemir  22 Units Subcutaneous BID   ipratropium-albuterol  3 mL Nebulization Q6H   metoprolol tartrate  5 mg Intravenous Q6H   multivitamin  1 tablet Per Tube QHS   mupirocin ointment   Nasal BID   mouth rinse  15 mL Mouth Rinse Q2H   sevelamer carbonate  2.4 g Per Tube TID WC    Assessment/ Plan:     46 y.o. male with medical problems of poorly controlled diabetes, hypertension, systolic CHF, obstructive sleep apnea, obesity    admitted on 02/11/2022 for  Principal Problem:   Acute respiratory failure with hypoxia and hypercarbia ([place])  Active Problems:   Acute on chronic systolic CHF (congestive heart failure) (HCC)   Hypertensive urgency   Type 2 diabetes mellitus with peripheral neuropathy (HCC)   Acute heart failure with preserved ejection fraction (HCC)   Renal failure (ARF), acute on chronic (HCC)   Hospital-acquired pneumonia   ARDS (adult respiratory distress syndrome) (HCC)   Pressure injury of skin    # Acute kidney injury: Acute kidney injury is most likely secondary to acute tubular necrosis possibly due to hypotension, fever and sepsis.  Patient became oligoanuric with metabolic acidosis and hyperkalemia.   -Last dialysis received on Tuesday, no UF. -Renal function remains stable today, will hold dialysis for today. -Awaiting 24-hour urine creatinine clearance to determine  need for continued dialysis.  # Hyperphosphatemia -Phosphorus 6.4 today, continue sevelamer  # Hyperkalemia:  -Potassium 4.4, monitoring  #Chronic systolic CHF with volume overload 2D echo February 16, 2022-LVEF 40 to 45%, mild concentric LVH moderately enlarged right ventricle, moderately dilated left atrium and right atrium.   #4  Acute hypoxic Vent dependent respiratory failure:  Now with tracheostomy. Vent assisted   #5 Sepsis: Management as per ICU and ID teams. - completed linezolid and Augmentin.      LOS: [facility] 8/24/202311:01 AM

## 2022-03-13 NOTE — Progress Notes (Signed)
NAME:  Samuel Chavez, MRN:  [id number], DOB:  [birth year], LOS: 30 ADMISSION DATE:  02/11/2022  History of Present Illness:  46 y.o male with significant PMH as below who presented to the ED with chief complaints of SOB and worsening bilateral lower extremities edema.   ED Course: In the emergency department, the temperature was 37.3C, the heart rate 99 beats/minute, the blood pressure 187/102 mm Hg, the respiratory rate 20 breaths/minute, and the oxygen saturation 89% on. He was noted to be drowsy but still protecting his airway and responding appropriately.   Pertinent  Medical History   Past Medical History:  Diagnosis Date   Diabetes mellitus without complication (HCC)    Hypertension    Pancreatitis     Significant Hospital Events: Including procedures, antibiotic start and stop dates in addition to other pertinent events   7/25: Admitted to hospitalist service w/acute on chronic hypoxic hypercapnic resp. failure requiring BiPAP.Failed BiPAP and intubated. PCCM consulted 7/26: INTUBATED, difficlut to sedate, started KETAMINE INFUSION 7/27: severe hypoxia, PEEP increased to 15 but decreased back to 10 7/28: Persistent severe hypoxia, ventilator changes made 7/29: Persistent hypoxia, recruitment maneuvers instituted, ventilator changes made, empiric heparin as patient cannot be scanned query PE -02/17/22- patient is severely critically ill with bilateral multifocal pneumonia on sedation and paralysis with mechanical ventilation maximal settings.  He is at cusp of death, we met with daughter today and discussed severity of illness. Unable to perform CT due to severity of critical illness. Met with previous PCCM doc and discussed medical plan.  Patient had recruitment maneuvers last few days without improvement, on heparin gtt for possible PE.  02/18/22- patient continues to require maximal settings on ventilator despite good UOP.  He destaturated to <50% spO2 on MV today required BGV.    02/19/22- patient continues to require 100% FiO2 on maximal setting with high PEEP ladder for possible ARDS.  He was diuresed and on steroids with development of AKI.  His CXR had slight interval improvement on right. We discussed case with vascular surgery regarding possible empiric tPA for PE.  02/20/22- patient was unable to get CT today due to continued severe hypoxemia.  Daughter and other family members at bedside we reviewed case and medical plan.  There seems to be some confusion from family as they had asked me to wake patient up and take off ventilator even though I had explained multiple times that he is at very high risk for death.  They laughed at my comments and seemed to think it was not true. We may still be able to do bronchoscopy but its high risk.  We have reduced IV infusions by switching some medications to OGT route.  02/21/22- patient is weaned to 60%FiO2.  Plan for possible bronch if patient is tolerating.  Family at bedside.  If able to we will obtain more imaging and VQ scan to rule out PE.  02/22/22- Patient weaned to 50% on PRVC.  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 CBC stable , CMP with improved GFR. Monitoring Na, pharmacy and renal following for electrolytes/renal function.  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  Overall marked improvement on ventilator over past 48h. Met with daughter at bedside reviewed medical plan.  02/24/22-Vent requirements continue to slowly improve, currently 40% FiO2 and 14 PEEP.  AKI continues to slowly improve, remains hyperkalemic with potassium of 5.8.  Placed back on  Veltassa, Diurese x1.  Trach scheduled for tomorrow at 1:15 PM 02/25/22- Diurese with Lasix x1 dose. Trach scheduled for today. 02/26/22: Remains mechanically ventilated via newly placed tracheostomy will attempt to wean ventilator settings today.  Perform WUA  02/26/22: Pt remains mechanically  ventilated via tracheostomy vent settings: PEEP 14/FiO2 80% 02/27/22: Fevers, ID consulted. Repeating Tracheal aspirate and UA.  Zosyn started 02/28/22: Persistent fevers, Tracheal aspirate with Staph aureus, start Linezolid.  Worsening Creatinine, considering Lasix gtt given high vent requirements (100% FiO2, 14 peep).  D/c fentanyl gtt and decrease oral benzos/narcotics 03/01/22: worsening anuric renal failure. HD line placed and started on hemodialysis. 03/02/22: Vent support slowly improving. Plan for HD again today. 8/14-8/15: remains febrile, encephalopathic despite HD sessions 8/16 fever curve improved, clots noted in LUE 8/18: continued low grade fever (t max 100.6).  Receiving HD.  Slight improvement in encephalopathy 8/19 off pressors, off sedation, remains on vent  8/20: Afebrile, tolerating PSV 8/21: Remains afebrile, obtain CT Abdomen for IR to evaluate for PEG placement.  Tolerating PSV (15/5).  Consult PT for passive ROM 8/22: Extremely weak, but able to MAE to command, nodding to questions. Tolerating PSV, will trial TC. Deemed not candidate for IR placement of PEG, will have General Surgery evaluate  Consults:  PCCM Nephrology Vascular Surgery Palliative Care ENT Infectious Disease  Procedures:  7/25: Intubation 7/26: PICC triple-lumen, right basilic vein 7/67: Left radial arterial line  8/08: Size 8 proximal XLT Shiley inserted 8/12: Right IJ HD catheter placed  Significant Diagnostic Tests:  7/25  Chest Xray:Chest x-ray showed cardiomegaly and pulmonary vascular congestion without overt pulmonary edema 7/25 Echocardiogram: difficult study, LVEF was estimated at 55 to 60%, dilated LV moderate LVH, grade 1 DD, enlargement of the right ventricle, left atrial size moderately dilated, right atrial size moderately dilated this is consistent with restrictive physiology (echo reading not congruous with prior echoes from [facility]) 7/29: Venous US BLE>>IMPRESSION: No  lower extremity DVT. 7/30 Limited echo: LVEF 40 to 45% decreased LV function, no wall motion abnormalities, moderate dilation of the LV concentric LVH, right ventricular size moderately enlarged, right atrial enlargement, this is more consistent with prior [facility] scans 8/4: CT Head>>No acute intracranial abnormality. Paranasal sinusitis. Bilateral complete opacification of the mastoid air cells and inner ears, as can be seen in the setting of otitis media. Correlate with symptoms. 8/4: CT Chest/Abdomen/Pelvis>>Extensive ground-glass, alveolar and patchy dense infiltrates in both lungs suggesting multifocal pneumonia. Part of this finding may suggest underlying pulmonary edema. Small bilateral pleural effusions are seen. Cardiomegaly. There is ectasia of the main pulmonary artery suggesting pulmonary arterial hypertension. There is no evidence of intestinal obstruction or pneumoperitoneum. There is no hydronephrosis. Appendix is not dilated. UB diverticula are seen in the colon without signs of focal diverticulitis. Severe degenerative changes are noted at L4-L5 level with significant interval worsening. Findings may be due to severe disc degeneration. If there is clinical suspicion for discitis or osteomyelitis, follow-up MRI may be considered 8/4: Lung V/Q>>Pulmonary embolism absent. 8/16: RUE brachial and axillary vein DVT associated with PICC line 8/22: General Surgery consulted tentatively scheduled for G tube placement on 08/25 8/23: No acute events overnight tolerating SBT 10/5 will attempt to transition to TCT today  8/24: No acute events overnight continues to tolerate SBT, will attempt TCT as tolerated   Micro Data:  7/25: SARS-CoV-2 PCR> negative 7/25: MRSA PCR>> NEG 7/27: Strep pneumoniae Ag>> negative 7/27: Legionella Ag>> negative 7/27: Mycoplasma>> <770 7/27: Sputum>> Strep pneumo,Haemophilus influenzae  7/30 Sputum cult >> negative 8/4: Tracheal  aspirate>>negative 8/9: Tracheal aspirate>>MRSA 8/10: RVP>>negative 8/10: Right nasal Wound culture>>STAPHYLOCOCCUS AUREUS, PROVOTELLA 8/10: MRSA PCR>> Positive 8/11: Blood culture 1/4>>Staphylococcus epidermidis MecA/C (suspect contaminant)  Antimicrobials:  Cefepime 7/27 >>7/31 Ceftriaxone 7/31>>8/4 Vancomycin 7/27 >> 7/29, restarted 7/30 (resumed due to fever spike on Maxipime) ? Resistant Strep pneumo >> 7/31 Unasyn 8/14>>8/17 Augmentin 8/17>>8/21 Linezolid 8/11>>8/20  Interim History / Subjective:  As outlined above under significant events   Objective   Blood pressure 133/82, pulse 69, temperature 99.3 F (37.4 C), temperature source Oral, resp. rate 16, height $RemoveBe'5\' 9"'NuQWulKSk$  (1.753 m), weight (!) 204 kg, SpO2 95 %.    Vent Mode: Spontaneous FiO2 (%):  [45 %-60 %] 45 % PEEP:  [5 cmH20] 5 cmH20 Pressure Support:  [10 cmH20] 10 cmH20   Intake/Output Summary (Last 24 hours) at 03/13/2022 0818 Last data filed at 03/13/2022 0700 Gross per 24 hour  Intake 707.57 ml  Output 2450 ml  Net -1742.43 ml   Filed Weights    Examination: GENERAL: Acute on chronically ill appearing male, awake, on vent, in NAD HENT: Atraumatic, normocephalic, neck supple, 8 XLT Tracheostomy dressing clean, dry and intact LUNGS: Rhonchi throughout, even, non labored  CV: NSR, RRR, s1s2, no M/R/G, 2+ generalized edema  ABDOMEN: Obese, soft, nontender, no guarding or rebound tenderness, BS+ x4 MUSCULOSKELETAL: Generalized weakness, moves all extremities  NEURO:  Alert, following commands, PERRL  Resolved Hospital Problem list   Community-acquired pneumonia: Streptococcus pneumoniae and Haemophilus influenzae ~ TREATED MRSA HCAP ~ TREATED Provetella Sinusitis ~ TREATED Hypernatremia  Assessment & Plan:  Acute on chronic hypoxemic and hypercapneic respiratory failure now s/p tracheostomy PMHx: OSA/OHS - Full vent support, implement lung protective strategies - Plateau pressures less than 30 cm H20 -  Wean FiO2 & PEEP as tolerated to maintain O2 sats >92% - Follow intermittent Chest X-ray & ABG as needed - SBT or TCT as tolerated  - Implement VAP Bundle - Prn Bronchodilators  MRSA HCAP- completed linezolid x 7 days Provetella sinusitis- completed unasyn/augmentin Persistent low grade fever, suspect due to RUE DVT ~ RESOLVED - Trend WBC and monitor fever curve  - ID following, appreciate input   Acute renal failure with hyperkalemia now on iHD - Trend BMP - Replace electrolytes as indicated - Monitor UOP: improving  - Nephrology consulted appreciate input: HD per recommendations    DM2 with hyperglycemia - CBG's q4h; Target range of 140 to 180 - SSI & Semglee - Follow ICU Hypo/Hyperglycemia protocol  Ongoing severe toxic encephalopathy- 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  Still uremic.  EEG 8/16 neg.  - Avoid sedating medications as able - PT/OT following - Mobilize as able  PICC associated DVT- PICC removed - Heparin gtt - Once PEG completed, can transition to Eliquis  Best Practice (right click and "Reselect all SmartList Selections" daily)   Diet/type: NPO pending G tube placement  DVT prophylaxis: systemic heparin GI prophylaxis: PPI Lines: Discontinued HD catheter 03/12/22 Foley: N/A Code Status:  full code Last date of multidisciplinary goals of care discussion [03/13/22]  Long term prognosis guarded  Critical Care Time: 30 minutes  Donell Beers, [place] Pager [phone] (please enter 7 digits) PCCM Consult Pager [phone] (please enter 7 digits)

## 2022-03-14 ENCOUNTER — Encounter
Admission: EM | Disposition: A | Discharge status: Rehabilitation Facility | Payer: Self-pay | Source: Home / Self Care | Attending: Internal Medicine

## 2022-03-14 ENCOUNTER — Other Ambulatory Visit: Payer: Self-pay

## 2022-03-14 ENCOUNTER — Encounter: Payer: Self-pay | Admitting: Internal Medicine

## 2022-03-14 ENCOUNTER — Inpatient Hospital Stay: Payer: Medicaid Other | Admitting: Certified Registered"

## 2022-03-14 DIAGNOSIS — J9621 Acute and chronic respiratory failure with hypoxia: Secondary | ICD-10-CM | POA: Diagnosis not present

## 2022-03-14 LAB — CBC WITH DIFFERENTIAL/PLATELET
Abs Immature Granulocytes: 0.02 10*3/uL (ref 0.00–0.07)
Basophils Absolute: 0 10*3/uL (ref 0.0–0.1)
Basophils Relative: 0 %
Eosinophils Absolute: 0.4 10*3/uL (ref 0.0–0.5)
Eosinophils Relative: 6 %
HCT: 29.7 % — ABNORMAL LOW (ref 39.0–52.0)
Hemoglobin: 8.6 g/dL — ABNORMAL LOW (ref 13.0–17.0)
Immature Granulocytes: 0 %
Lymphocytes Relative: 12 %
Lymphs Abs: 0.8 10*3/uL (ref 0.7–4.0)
MCH: 26.5 pg (ref 26.0–34.0)
MCHC: 29 g/dL — ABNORMAL LOW (ref 30.0–36.0)
MCV: 91.4 fL (ref 80.0–100.0)
Monocytes Absolute: 0.7 10*3/uL (ref 0.1–1.0)
Monocytes Relative: 10 %
Neutro Abs: 4.7 10*3/uL (ref 1.7–7.7)
Neutrophils Relative %: 72 %
Platelets: 208 10*3/uL (ref 150–400)
RBC: 3.25 MIL/uL — ABNORMAL LOW (ref 4.22–5.81)
RDW: 16 % — ABNORMAL HIGH (ref 11.5–15.5)
WBC: 6.6 10*3/uL (ref 4.0–10.5)
nRBC: 0 % (ref 0.0–0.2)

## 2022-03-14 LAB — RENAL FUNCTION PANEL
Albumin: 2.4 g/dL — ABNORMAL LOW (ref 3.5–5.0)
Anion gap: 9 (ref 5–15)
BUN: 59 mg/dL — ABNORMAL HIGH (ref 6–20)
CO2: 29 mmol/L (ref 22–32)
Calcium: 8.5 mg/dL — ABNORMAL LOW (ref 8.9–10.3)
Chloride: 107 mmol/L (ref 98–111)
Creatinine, Ser: 2.29 mg/dL — ABNORMAL HIGH (ref 0.61–1.24)
GFR, Estimated: 35 mL/min — ABNORMAL LOW (ref >60)
Glucose, Bld: 98 mg/dL (ref 70–99)
Phosphorus: 6.2 mg/dL — ABNORMAL HIGH (ref 2.5–4.6)
Potassium: 4.1 mmol/L (ref 3.5–5.1)
Sodium: 145 mmol/L (ref 135–145)

## 2022-03-14 LAB — C-REACTIVE PROTEIN: CRP: 1.6 mg/dL — ABNORMAL HIGH (ref <1.0)

## 2022-03-14 LAB — HEPARIN LEVEL (UNFRACTIONATED): Heparin Unfractionated: 0.52 IU/mL (ref 0.30–0.70)

## 2022-03-14 LAB — GLUCOSE, CAPILLARY
Glucose-Capillary: 100 mg/dL — ABNORMAL HIGH (ref 70–99)
Glucose-Capillary: 76 mg/dL (ref 70–99)
Glucose-Capillary: 88 mg/dL (ref 70–99)
Glucose-Capillary: 89 mg/dL (ref 70–99)
Glucose-Capillary: 89 mg/dL (ref 70–99)

## 2022-03-14 LAB — MAGNESIUM: Magnesium: 2 mg/dL (ref 1.7–2.4)

## 2022-03-14 LAB — PHOSPHORUS: Phosphorus: 6.1 mg/dL — ABNORMAL HIGH (ref 2.5–4.6)

## 2022-03-14 SURGERY — INSERTION, GASTROSTOMY TUBE, ROBOT-ASSISTED
Anesthesia: General

## 2022-03-14 MED ORDER — CEFAZOLIN SODIUM-DEXTROSE 1-4 GM/50ML-% IV SOLN
INTRAVENOUS | Status: DC | PRN
  Administered 2022-03-14: 3 g via INTRAVENOUS

## 2022-03-14 MED ORDER — CEFAZOLIN SODIUM-DEXTROSE 2-3 GM-%(50ML) IV SOLR: INTRAVENOUS | Status: DC | PRN

## 2022-03-14 MED ORDER — HYDROMORPHONE HCL 1 MG/ML IJ SOLN
INTRAMUSCULAR | Status: AC
  Filled 2022-03-14: qty 1

## 2022-03-14 MED ORDER — KETAMINE HCL 10 MG/ML IJ SOLN
INTRAMUSCULAR | Status: DC | PRN
  Administered 2022-03-14: 50 mg via INTRAVENOUS

## 2022-03-14 MED ORDER — KETAMINE HCL 50 MG/5ML IJ SOSY
PREFILLED_SYRINGE | INTRAMUSCULAR | Status: AC
  Filled 2022-03-14: qty 5

## 2022-03-14 MED ORDER — ONDANSETRON HCL 4 MG/2ML IJ SOLN
INTRAMUSCULAR | Status: DC | PRN
  Administered 2022-03-14: 4 mg via INTRAVENOUS

## 2022-03-14 MED ORDER — DEXAMETHASONE SODIUM PHOSPHATE 10 MG/ML IJ SOLN
INTRAMUSCULAR | Status: AC
  Filled 2022-03-14: qty 1

## 2022-03-14 MED ORDER — FENTANYL CITRATE (PF) 100 MCG/2ML IJ SOLN
INTRAMUSCULAR | Status: AC
  Filled 2022-03-14: qty 2

## 2022-03-14 MED ORDER — BUPIVACAINE-EPINEPHRINE (PF) 0.5% -1:200000 IJ SOLN
INTRAMUSCULAR | Status: DC | PRN
  Administered 2022-03-14: 30 mL via INTRAMUSCULAR

## 2022-03-14 MED ORDER — HYDROMORPHONE HCL 1 MG/ML IJ SOLN
INTRAMUSCULAR | Status: DC | PRN
  Administered 2022-03-14: 1 mg via INTRAVENOUS

## 2022-03-14 MED ORDER — DEXMEDETOMIDINE (PRECEDEX) IN NS 20 MCG/5ML (4 MCG/ML) IV SYRINGE
PREFILLED_SYRINGE | INTRAVENOUS | Status: DC | PRN
  Administered 2022-03-14: 8 ug via INTRAVENOUS
  Administered 2022-03-14 (×2): 12 ug via INTRAVENOUS
  Administered 2022-03-14: 8 ug via INTRAVENOUS

## 2022-03-14 MED ORDER — BUPIVACAINE LIPOSOME 1.3 % IJ SUSP
INTRAMUSCULAR | Status: AC
  Filled 2022-03-14: qty 20

## 2022-03-14 MED ORDER — ROCURONIUM BROMIDE 100 MG/10ML IV SOLN
INTRAVENOUS | Status: DC | PRN
  Administered 2022-03-14: 30 mg via INTRAVENOUS
  Administered 2022-03-14: 90 mg via INTRAVENOUS

## 2022-03-14 MED ORDER — MIDAZOLAM HCL 2 MG/2ML IJ SOLN
INTRAMUSCULAR | Status: AC
  Filled 2022-03-14: qty 2

## 2022-03-14 MED ORDER — ONDANSETRON HCL 4 MG/2ML IJ SOLN
INTRAMUSCULAR | Status: AC
  Filled 2022-03-14: qty 2

## 2022-03-14 MED ORDER — BUPIVACAINE-EPINEPHRINE (PF) 0.5% -1:200000 IJ SOLN
INTRAMUSCULAR | Status: AC
  Filled 2022-03-14: qty 30

## 2022-03-14 MED ORDER — SUGAMMADEX SODIUM 500 MG/5ML IV SOLN
INTRAVENOUS | Status: DC | PRN
  Administered 2022-03-14: 500 mg via INTRAVENOUS

## 2022-03-14 MED ORDER — MIDAZOLAM HCL 2 MG/2ML IJ SOLN
INTRAMUSCULAR | Status: DC | PRN
  Administered 2022-03-14: 2 mg via INTRAVENOUS

## 2022-03-14 MED ORDER — FENTANYL CITRATE (PF) 100 MCG/2ML IJ SOLN
INTRAMUSCULAR | Status: DC | PRN
  Administered 2022-03-14 (×2): 50 ug via INTRAVENOUS

## 2022-03-14 SURGICAL SUPPLY — 58 items
ADH SKN CLS APL DERMABOND .7 (GAUZE/BANDAGES/DRESSINGS) ×1
CANNULA REDUC XI 12-8 STAPL (CANNULA) ×1
CANNULA REDUCER 12-8 DVNC XI (CANNULA) ×1 IMPLANT
COVER TIP SHEARS 8 DVNC (MISCELLANEOUS) ×1 IMPLANT
COVER TIP SHEARS 8MM DA VINCI (MISCELLANEOUS) ×1
DERMABOND ADVANCED (GAUZE/BANDAGES/DRESSINGS) ×1
DERMABOND ADVANCED .7 DNX12 (GAUZE/BANDAGES/DRESSINGS) ×1 IMPLANT
DRAPE ARM DVNC X/XI (DISPOSABLE) ×3 IMPLANT
DRAPE COLUMN DVNC XI (DISPOSABLE) ×1 IMPLANT
DRAPE DA VINCI XI ARM (DISPOSABLE) ×3
DRAPE DA VINCI XI COLUMN (DISPOSABLE) ×1
ELECT CAUTERY BLADE 6.4 (BLADE) ×1 IMPLANT
ELECT REM PT RETURN 9FT ADLT (ELECTROSURGICAL) ×1
ELECTRODE REM PT RTRN 9FT ADLT (ELECTROSURGICAL) ×1 IMPLANT
GLOVE BIO SURGEON STRL SZ7 (GLOVE) ×2 IMPLANT
GOWN STRL REUS W/ TWL LRG LVL3 (GOWN DISPOSABLE) ×3 IMPLANT
GOWN STRL REUS W/TWL LRG LVL3 (GOWN DISPOSABLE) ×8
IRRIGATION STRYKERFLOW (MISCELLANEOUS) IMPLANT
IRRIGATOR STRYKERFLOW (MISCELLANEOUS)
KIT PINK PAD W/HEAD ARE REST (MISCELLANEOUS) ×1
KIT PINK PAD W/HEAD ARM REST (MISCELLANEOUS) ×1 IMPLANT
LABEL OR SOLS (LABEL) ×1 IMPLANT
MANIFOLD NEPTUNE II (INSTRUMENTS) ×1 IMPLANT
NDL INSUFFLATION 14GA 120MM (NEEDLE) ×1 IMPLANT
NEEDLE HYPO 22GX1.5 SAFETY (NEEDLE) ×1 IMPLANT
NEEDLE INSUFFLATION 14GA 120MM (NEEDLE) ×1 IMPLANT
OBTURATOR OPTICAL STANDARD 8MM (TROCAR) ×1
OBTURATOR OPTICAL STND 8 DVNC (TROCAR) ×1
OBTURATOR OPTICALSTD 8 DVNC (TROCAR) ×1 IMPLANT
PACK LAP CHOLECYSTECTOMY (MISCELLANEOUS) ×1 IMPLANT
PENCIL SMOKE EVACUATOR (MISCELLANEOUS) ×1 IMPLANT
SEAL CANN UNIV 5-8 DVNC XI (MISCELLANEOUS) ×3 IMPLANT
SEAL XI 5MM-8MM UNIVERSAL (MISCELLANEOUS) ×3
SEALER VESSEL DA VINCI XI (MISCELLANEOUS)
SEALER VESSEL EXT DVNC XI (MISCELLANEOUS) IMPLANT
SET TUBE SMOKE EVAC HIGH FLOW (TUBING) ×1 IMPLANT
SOLUTION ELECTROLUBE (MISCELLANEOUS) ×1 IMPLANT
SPONGE DRAIN TRACH 4X4 STRL 2S (GAUZE/BANDAGES/DRESSINGS) ×1 IMPLANT
SPONGE T-LAP 18X18 ~~LOC~~+RFID (SPONGE) ×1 IMPLANT
SPONGE T-LAP 4X18 ~~LOC~~+RFID (SPONGE) ×1 IMPLANT
STAPLER CANNULA SEAL DVNC XI (STAPLE) ×1 IMPLANT
STAPLER CANNULA SEAL XI (STAPLE) ×1
SUT ETHILON 3-0 FS-10 30 BLK (SUTURE) ×1
SUT MNCRL 4-0 (SUTURE) ×2
SUT MNCRL 4-0 27XMFL (SUTURE) ×2
SUT SILK 3 0 SH 30 (SUTURE) ×2 IMPLANT
SUT VLOC 90 6 CV-15 VIOLET (SUTURE) IMPLANT
SUTURE EHLN 3-0 FS-10 30 BLK (SUTURE) IMPLANT
SUTURE MNCRL 4-0 27XMF (SUTURE) ×1 IMPLANT
SYR 20ML LL LF (SYRINGE) ×1 IMPLANT
SYR 30ML LL (SYRINGE) ×1 IMPLANT
TRAP FLUID SMOKE EVACUATOR (MISCELLANEOUS) ×1 IMPLANT
TROCAR 5M 150ML BLDLS (TROCAR) IMPLANT
TUBE GASTRO 14F 5C (TUBING) IMPLANT
TUBE GASTRO FEEDING 16FR (TUBING) IMPLANT
TUBE JEJUNO 22X14 (TUBING) IMPLANT
TUBE MIC GASTROSTOMY 18FR 7-10 (TUBING) IMPLANT
WATER STERILE IRR 500ML POUR (IV SOLUTION) ×1 IMPLANT

## 2022-03-14 NOTE — Plan of Care (Signed)
  Problem: Education: Goal: Knowledge of General Education information will improve Description: Including pain rating scale, medication(s)/side effects and non-pharmacologic comfort measures Outcome: Progressing   Problem: Clinical Measurements: Goal: Will remain free from infection Outcome: Progressing Goal: Diagnostic test results will improve Outcome: Progressing Goal: Respiratory complications will improve Outcome: Progressing   Problem: Safety: Goal: Ability to remain free from injury will improve Outcome: Progressing   Problem: Skin Integrity: Goal: Risk for impaired skin integrity will decrease Outcome: Progressing   Problem: Metabolic: Goal: Ability to maintain appropriate glucose levels will improve Outcome: Progressing   Problem: Nutritional: Goal: Progress toward achieving an optimal weight will improve Outcome: Progressing   Problem: Skin Integrity: Goal: Risk for impaired skin integrity will decrease Outcome: Progressing   Problem: Education: Goal: Ability to verbalize understanding of medication therapies will improve Outcome: Not Progressing   Problem: Nutrition: Goal: Adequate nutrition will be maintained Outcome: Not Progressing

## 2022-03-14 NOTE — Progress Notes (Signed)
[place] for IV Heparin  Indication: RUE DVT associated with PICC  Patient Measurements: Height: 5\' 9"  (175.3 cm) Weight:  (scale not working) IBW/kg (Calculated) : 70.7 Heparin Dosing Weight: 123.4 kg   Labs: Recent Labs    03/12/22 0709 03/13/22 0339 03/13/22 0348 03/14/22 0551  HGB 8.0* 8.1*  --  8.6*  HCT 27.4* 28.0*  --  29.7*  PLT 198 200  --  208  HEPARINUNFRC 0.30 0.51  --  0.52  CREATININE 2.56*  --  2.65* 2.29*   Estimated Creatinine Clearance: 72.2 mL/min (A) (by C-G formula based on SCr of 2.29 mg/dL (H)).  Medical History: Past Medical History:  Diagnosis Date   Diabetes mellitus without complication ([place])    Hypertension    Pancreatitis    Assessment: Patient is a 46 y/o M with medical history including DM, HTN, pancreatitis, tobacco use disorder, systolic CHF, OSA, DM c/b diabetic neuropathy, lumbar radiculopathy, morbid obesity who is admitted with acute respiratory failure in setting of acute CHF, hypertensive urgency, and pneumonia. Patient with prolonged admission on ventilator complicated by acute renal failure.  Pharmacy consulted to initiate and manage IV heparin for RUE DVT.  Goal of Therapy:  Heparin level 0.3-0.7 units/ml Monitor platelets by anticoagulation protocol: Yes   Plan:  heparin was stopped this morning at 0744 for gastrostomy tube placement based on MD recommendations Heparin will remain on hold until surgery advises restarting  Vallery Sa, PharmD, BCPS  03/14/2022 1:49 PM

## 2022-03-14 NOTE — Op Note (Signed)
Preoperative diagnosis: Dysphagia Postoperative diagnosis: Same  Procedure: Robotic assisted laparoscopic gastrostomy tube placement   Anesthesia: General  Surgeon: Dr. Lysle Pearl  Wound Classification: Clean contaminated  Specimen: none  Complications: None  Estimated Blood Loss: 2mL   Indications: Dysphagia secondary to current acute illness, with poor prognosis of ability to resume oral feeding.  Consent obtained per daughter who is power of attorney understanding the risks involved.  Findings: 1.  Morbidly obese abdomen with extensive omental adhesions to the abdominal wall.   2.  Successful placement of 78 French gastric tube with pexied to the anterior abdominal wall   3. Adequate hemostasis achieved  Description of procedure: The patient was taken to the operating room. A time-out was completed verifying correct patient, procedure, site, positioning, and implant(s) and/or special equipment prior to beginning this procedure.  Area was prepped and draped in the usual sterile fashion  Veress needle inserted at palmer's point.  Saline drop test noted to be positive with gradual increase in pressure after initiation of gas insufflation.  15 mm of pressure was achieved prior to removing the Veress needle and then placing a 5 mm port via the Optiview technique through the same point.  Inspection of the abdomen noted no injury to the surrounding area.  Examination of the abdomen noted omental adhesions around the periumbilical area, likely from prior lap chole.  A small window was noted in the left lower quadrant.  Extensive omentum covering the stomach hiding deep to a enlarged liver.    Initial port placement measuring 20 cm from the partially visible stomach, slightly right to the umbilicus.  Additional points were marked 8 cm from the central port.  The left lower quadrant site marked was able to be visualized around the omental adhesions.  Under direcT realization, a bariatric 8 mm  port was placed.  Camera was switched over to this port and the remaining robotic ports were placed under direct visualization.  Xi platform was then brought into the operative field and docked.  Camera placed through the periumbilical port and graspers placed through the left lower quadrant port in hopes of being able to clear some of the omental adhesions to start using the right lower quadrant port but this proved unsuccessful.  Decision was made to add a additional 8 mm robotic port port lateral to the left lower quadrant port and shift the camera in the working arm ports to the left, where the omental adhesions were away from the port insertion site as well as the instruments.  Surgeon scrubbed back in and the additional 8 mm port was placed under direct visualization.  17 French gastric tube was also placed through the previously planned incision site and left within the abdominal cavity.   Attention then turned towards the stomach where with gentle traction the overlying omentum was not able to be retracted away from the area.  Gentle traction on the stomach allowed visualization of a area for placement of gastric tube.  Gastrostomy made using scissors and the feeding tube was able to be placed through this hole.  Balloon insufflated with 20 mL of sterile water and traction placed to ensure balloon and tube stayed within the stomach lumen.  3-0 silk was then used to pursestring around the gastrostomy site to further secure the tube in place.  Flushing of the feeding portion did not note any leaks around the tube. The feeding tube was then gently placed under traction until the stomach was able to be pulled out  from underneath the liver and adjacent to the anterior abdominal wall.   This was done under 10 mm Hg of intra abdominal pressure to minimize tension.  3-0 V-Loc was then used to additionally pexy the stomach to the anterior abdominal wall along with the remnants of the 3-0 purse string silk used  for additional pexing to the abdominal wall.  Gentle traction on the tubing noted the balloon to still be intact.  Abdomen then desufflated and all ports removed under direct visualization after the 2 needles were removed from abdominal cavity..  All the skin incisions were then closed with a subcuticular stitch of Monocryl 4-0. Dermabond was applied.  The patient tolerated the procedure well and was taken to ICU in stable condition. Sponge and instrument count correct at end of procedure.

## 2022-03-14 NOTE — Transfer of Care (Addendum)
Immediate Anesthesia Transfer of Care Note  Patient: Samuel Chavez  Procedure(s) Performed: XI ROBOT ASSISTED GASTROSTOMY TUBE PLACEMENT  Patient Location: PACU and ICU  Anesthesia Type:General  Level of Consciousness: drowsy  Airway & Oxygen Therapy: Patient Spontanous Breathing  Post-op Assessment: Report given to RN  Post vital signs: stable  Last Vitals:  Vitals Value Taken Time  BP    Temp    Pulse    Resp    SpO2      Last Pain:  Vitals:   03/14/22 0800  TempSrc: Oral  PainSc:          Complications: No notable events documented.

## 2022-03-14 NOTE — Progress Notes (Signed)
OT Cancellation Note  Patient Details Name: PLES TRUDEL MRN: [id number] DOB: 1976-01-23   Cancelled Treatment:    Reason Eval/Treat Not Completed: Patient at procedure or test/ unavailable. Chart reviewed. Pt off the unit for surgery this date, will plan to see next date as available.   Dessie Coma, M.S. OTR/L  03/14/22, 2:08 PM  ascom [phone]

## 2022-03-14 NOTE — Progress Notes (Signed)
[place] for IV Heparin  Indication: RUE DVT associated with PICC  Patient Measurements: Height: 5\' 9"  (175.3 cm) Weight:  (scale not working) IBW/kg (Calculated) : 70.7 Heparin Dosing Weight: 123.4 kg   Labs: Recent Labs    03/12/22 0709 03/13/22 0339 03/13/22 0348 03/14/22 0551  HGB 8.0* 8.1*  --  8.6*  HCT 27.4* 28.0*  --  29.7*  PLT 198 200  --  208  HEPARINUNFRC 0.30 0.51  --  0.52  CREATININE 2.56*  --  2.65* 2.29*   Estimated Creatinine Clearance: 72.2 mL/min (A) (by C-G formula based on SCr of 2.29 mg/dL (H)).  Medical History: Past Medical History:  Diagnosis Date   Diabetes mellitus without complication ([place])    Hypertension    Pancreatitis    Assessment: Patient is a 46 y/o M with medical history including DM, HTN, pancreatitis, tobacco use disorder, systolic CHF, OSA, DM c/b diabetic neuropathy, lumbar radiculopathy, morbid obesity who is admitted with acute respiratory failure in setting of acute CHF, hypertensive urgency, and pneumonia. Patient with prolonged admission on ventilator complicated by acute renal failure.  Pharmacy consulted to initiate and manage IV heparin for RUE DVT.  Goal of Therapy:  Heparin level 0.3-0.7 units/ml Monitor platelets by anticoagulation protocol: Yes   Plan: heparin level remains therapeutic Continue heparin infusion rate at 2900 units/hr Heparin order scheduled to D/C @ 0730 prior to surgery today Pharmacy to follow up post-op  Renda Rolls, PharmD, [facility] 03/14/2022 6:49 AM

## 2022-03-14 NOTE — OR Nursing (Signed)
Patient was alert in ICU, but wasn't able to sign the consent. His daughter signed the consent.

## 2022-03-14 NOTE — Interval H&P Note (Signed)
History and Physical Interval Note:  03/14/2022 10:09 AM  Samuel Chavez  has presented today for surgery, with the diagnosis of Long term nutrition needs.  The various methods of treatment have been discussed with the patient and family. After consideration of risks, benefits and other options for treatment, the patient has consented to  Procedure(s): XI [place] (N/A) as a surgical intervention.  The patient's history has been reviewed, patient examined, no change in status, stable for surgery.  I have reviewed the patient's chart and labs.  Daughter is acting POA and she still consents to procedure.  All questions addressed at this time    Benjamine Sprague

## 2022-03-14 NOTE — Consult Note (Signed)
PHARMACY CONSULT NOTE  Pharmacy Consult for Electrolyte Monitoring and Replacement   Recent Labs: Potassium (mmol/L)  Date Value  03/14/2022 4.1   Magnesium (mg/dL)  Date Value  03/14/2022 2.0   Calcium (mg/dL)  Date Value  03/14/2022 8.5 (L)   Albumin (g/dL)  Date Value  03/14/2022 2.4 (L)   Phosphorus (mg/dL)  Date Value  03/14/2022 6.1 (H)  03/14/2022 6.2 (H)   Sodium (mmol/L)  Date Value  03/14/2022 145   Assessment: Patient is a 46 y/o M with medical history including DM, HTN, pancreatitis, tobacco use disorder, systolic CHF, OSA, DM c/b diabetic neuropathy, lumbar radiculopathy, morbid obesity who is admitted with acute respiratory failure in setting of acute CHF and hypertensive urgency. Pharmacy consulted to assist with electrolyte monitoring and replacement as indicated.  Goal of Therapy:  Electrolytes within normal limits  Plan:  --no electrolyte replacement warranted for today  --Follow-up electrolytes with AM labs tomorrow  Dallie Piles  03/14/2022 7:04 AM

## 2022-03-14 NOTE — Progress Notes (Signed)
PT Cancellation Note  Patient Details Name: KAYLUM SHRUM MRN: [id number] DOB: 1976/04/21   Cancelled Treatment:    Reason Eval/Treat Not Completed: Other (comment) Per chart review continue at transfer orders in place for PT services following procedure earlier this date.  Per nursing pt has not yet awakened from general anesthesia and is not appropriate for PT services at this time.  Will attempt to see pt at a future date/time as medically appropriate.    Linus Salmons PT, DPT 03/14/22, 3:22 PM

## 2022-03-14 NOTE — Progress Notes (Signed)
Placed back on vent with cpap +10 for overnight per req of NP

## 2022-03-14 NOTE — Progress Notes (Signed)
SLP Cancellation Note  Patient Details Name: Samuel Chavez MRN: [id number] DOB: 1975-11-09   Cancelled treatment:       Reason Eval/Treat Not Completed: Patient at procedure or test/unavailable (pt out of room for PEG. Will f/u tomorrow.)     Orinda Kenner, MS, CCC-SLP Speech Language Pathologist Rehab Services; [place] [phone] (ascom) Celestine Bougie 03/14/2022, 11:13 AM

## 2022-03-14 NOTE — Anesthesia Preprocedure Evaluation (Signed)
Anesthesia Evaluation  Patient identified by MRN, date of birth, ID band Patient unresponsive  General Assessment Comment:Patient is currently intubated, sedated, and on mechanical ventilation   Reviewed: Allergy & Precautions, NPO status , Patient's Chart, lab work & pertinent test results  History of Anesthesia Complications Negative for: history of anesthetic complications  Airway Mallampati: Trach  TM Distance: >3 FB     Dental  (+) Chipped, Dental Advidsory Given   Pulmonary neg shortness of breath, sleep apnea , neg recent URI, Current Smoker,    Pulmonary exam normal        Cardiovascular hypertension, +CHF  (-) Past MI  + Peripheral Edema Peripheral edema  Acute exacerbation of systolic and diastolic CHF 2D echo from February 11, 2022-LVEF 55 to 60%, moderate LVH, grade 1 diastolic dysfunction, enlarged right ventricle, moderately dilated right atrium, moderately dilated left atrium   Neuro/Psych  Neuromuscular disease negative psych ROS   GI/Hepatic negative GI ROS, Neg liver ROS, neg GERD  ,  Endo/Other  diabetes, Poorly Controlled, Type 2, Insulin DependentMorbid obesity  Renal/GU Renal diseaseacute kidney injury     Musculoskeletal   Abdominal (+) + obese,   Peds  Hematology negative hematology ROS (+)   Anesthesia Other Findings 46 y/o male with h/o COPD, CHF, HTN, DM and OSA who is admitted with CHF exacerbation, CAP and severe ARDS.  Past Medical History: No date: Diabetes mellitus without complication (HCC) No date: Hypertension No date: Pancreatitis  Past Surgical History: No date: CHOLECYSTECTOMY  BMI    Body Mass Index: 67.00 kg/m      Reproductive/Obstetrics negative OB ROS                             Anesthesia Physical  Anesthesia Plan  ASA: 4  Anesthesia Plan: General   Post-op Pain Management:    Induction: Intravenous  PONV Risk Score and Plan:  Ondansetron, Dexamethasone, Midazolam and Treatment may vary due to age or medical condition  Airway Management Planned: Tracheostomy  Additional Equipment:   Intra-op Plan:   Post-operative Plan: Post-operative intubation/ventilation  Informed Consent: I have reviewed the patients History and Physical, chart, labs and discussed the procedure including the risks, benefits and alternatives for the proposed anesthesia with the patient or authorized representative who has indicated his/her understanding and acceptance.     Dental Advisory Given  Plan Discussed with: Anesthesiologist, CRNA and Surgeon  Anesthesia Plan Comments: (Daughter consented for risks of anesthesia including but not limited to:  - adverse reactions to medications - damage to eyes, teeth, lips or other oral mucosa - nerve damage due to positioning  - sore throat or hoarseness - Damage to heart, brain, nerves, lungs, other parts of body or loss of life  She voiced understanding.)        Anesthesia Quick Evaluation

## 2022-03-14 NOTE — Progress Notes (Signed)
[facility]  PROGRESS NOTE   Subjective:   Patient seen and evaluated at bedside in ICU Remains critically ill. Alert and able to mouth words, requesting ice chips Heparin drip and tube feeds held for surgery Trach collar on 10L, no vent support at this time  UOP 2.85L  Objective:  Vital signs: Blood pressure (!) 151/87, pulse 76, temperature 98.9 F (37.2 C), temperature source Oral, resp. rate 16, height 5\' 9"  (1.753 m), weight (!) 204 kg, SpO2 94 %.  Intake/Output Summary (Last 24 hours) at 03/14/2022 1020 Last data filed at 03/14/2022 0900 Gross per 24 hour  Intake 621.15 ml  Output 3450 ml  Net -2828.85 ml    Filed Weights     Physical Exam: General:  No acute distress, resting comfortably  Head:  Normocephalic, atraumatic. Moist oral mucosal membranes  Eyes:  Anicteric  Lungs:    tracheostomy with 10L [place]  Heart:  Regular on monitor  Abdomen:   Soft, nontender,obese  Extremities: trace peripheral edema.  Neurologic: Alert.  Able to follow simple commands.  Skin:  No lesions  Access: Right IJ temp cath placed on 2/77/41    Basic Metabolic Panel: Recent Labs  Lab 03/10/22 0532 03/11/22 0757 03/12/22 0709 03/13/22 0339 03/13/22 0348 03/14/22 0551  NA 138 140 140  --  143 145  K 4.2 4.6 4.3  --  4.4 4.1  CL 99 102 104  --  106 107  CO2 28 28 29   --  27 29  GLUCOSE 115* 108* 132*  --  93 98  BUN 79* 89* 65*  --  63* 59*  CREATININE 3.51* 3.69* 2.56*  --  2.65* 2.29*  CALCIUM 7.7* 7.9* 8.1*  --  8.3* 8.5*  MG 2.2 2.4 2.2 2.2  --  2.0  PHOS 8.3* 9.3*  9.2* 6.0*  6.1* 6.3* 6.4* 6.1*  6.2*     CBC: Recent Labs  Lab 03/10/22 0532 03/11/22 0757 03/12/22 0709 03/13/22 0339 03/14/22 0551  WBC 8.1 7.7 7.0 6.7 6.6  NEUTROABS 6.1 5.6 5.0 4.4 4.7  HGB 7.9* 8.1* 8.0* 8.1* 8.6*  HCT 26.6* 27.5* 27.4* 28.0* 29.7*  MCV 90.8 91.7 90.1 92.7 91.4  PLT 206 197 198 200 208      Urinalysis: No results for input(s): "COLORURINE", "LABSPEC",  "PHURINE", "GLUCOSEU", "HGBUR", "BILIRUBINUR", "KETONESUR", "PROTEINUR", "UROBILINOGEN", "NITRITE", "LEUKOCYTESUR" in the last 72 hours.  Invalid input(s): "APPERANCEUR"     Imaging: No results found.   Medications:    sodium chloride 250 mL (03/03/22 2253)   anticoagulant sodium citrate     anticoagulant sodium citrate      Chlorhexidine Gluconate Cloth  6 each Topical Q0600   insulin aspart  0-20 Units Subcutaneous Q4H   insulin aspart  4 Units Subcutaneous Q4H   insulin detemir  22 Units Subcutaneous BID   ipratropium-albuterol  3 mL Nebulization Q6H   metoprolol tartrate  5 mg Intravenous Q6H   multivitamin  1 tablet Per Tube QHS   mupirocin ointment   Nasal BID   mouth rinse  15 mL Mouth Rinse Q2H   sevelamer carbonate  2.4 g Per Tube TID WC    Assessment/ Plan:     46 y.o. male with medical problems of poorly controlled diabetes, hypertension, systolic CHF, obstructive sleep apnea, obesity    admitted on 02/11/2022 for  Principal Problem:   Acute respiratory failure with hypoxia and hypercarbia (HCC) Active Problems:   Acute on chronic systolic CHF (congestive heart failure) (  [place])   Hypertensive urgency   Type 2 diabetes mellitus with peripheral neuropathy (HCC)   Acute heart failure with preserved ejection fraction (HCC)   Renal failure (ARF), acute on chronic (HCC)   Hospital-acquired pneumonia   ARDS (adult respiratory distress syndrome) (HCC)   Pressure injury of skin    # Acute kidney injury: Acute kidney injury is most likely secondary to acute tubular necrosis possibly due to hypotension, fever and sepsis.  Patient became oligoanuric with metabolic acidosis and hyperkalemia.   -24 hour creatinine clearance 40, no further dialysis is needed at this time.  - Renal function stable with great urine output  # Hyperphosphatemia -Phosphorus 6.1, continue sevelamer  # Hyperkalemia:  -Potassium 4.1, monitoring.  #Chronic systolic CHF with volume overload 2D  echo February 16, 2022-LVEF 40 to 45%, mild concentric LVH moderately enlarged right ventricle, moderately dilated left atrium and right atrium.   #4  Acute hypoxic Vent dependent respiratory failure:  Now with tracheostomy on 10L trach collar   #5 Sepsis: Management as per ICU and ID teams. - completed linezolid and Augmentin.      LOS: [facility] 8/25/202310:20 AM

## 2022-03-14 NOTE — TOC Progression Note (Signed)
Transition of Care [facility]) - Progression Note    Patient Details  Name: RYER ASATO MRN: [id number] Date of Birth: 17-Oct-1975  Transition of Care [facility]) CM/SW Contact  Shelbie Hutching, RN Phone Number: 03/14/2022, 1:01 PM  Clinical Narrative:    Patient is getting his PEG tube placed today.  RNCM heard from [place] with [employer], she has submitted his information to the [employer] in [place], they are reviewing his clinicals. Per Danise Mina they would like to see the patient able to participate in therapy a little more.     Expected Discharge Plan:  (TBD) Barriers to Discharge: Continued Medical Work up  Expected Discharge Plan and Services Expected Discharge Plan:  (TBD)   Discharge Planning Services: CM Consult   Living arrangements for the past 2 months: Single Family Home                                       Social Determinants of Health (SDOH) Interventions    Readmission Risk Interventions     No data to display

## 2022-03-14 NOTE — Progress Notes (Signed)
NAME:  Samuel Chavez, MRN:  [id number], DOB:  12/16/1975, LOS: 6 ADMISSION DATE:  02/11/2022  History of Present Illness:  46 y.o male with significant PMH as below who presented to the ED with chief complaints of SOB and worsening bilateral lower extremities edema.   ED Course: In the emergency department, the temperature was 37.3C, the heart rate 99 beats/minute, the blood pressure 187/102 mm Hg, the respiratory rate 20 breaths/minute, and the oxygen saturation 89% on. He was noted to be drowsy but still protecting his airway and responding appropriately.   Pertinent  Medical History   Past Medical History:  Diagnosis Date   Diabetes mellitus without complication ([place])    Hypertension    Pancreatitis     Significant Hospital Events: Including procedures, antibiotic start and stop dates in addition to other pertinent events   7/25: Admitted to hospitalist service w/acute on chronic hypoxic hypercapnic resp. failure requiring BiPAP.Failed BiPAP and intubated. PCCM consulted 7/26: INTUBATED, difficlut to sedate, started [place] 7/27: severe hypoxia, PEEP increased to 15 but decreased back to 10 7/28: Persistent severe hypoxia, ventilator changes made 7/29: Persistent hypoxia, recruitment maneuvers instituted, ventilator changes made, empiric heparin as patient cannot be scanned query PE -02/17/22- patient is severely critically ill with bilateral multifocal pneumonia on sedation and paralysis with mechanical ventilation maximal settings.  He is at cusp of death, we met with daughter today and discussed severity of illness. Unable to perform CT due to severity of critical illness. Met with previous PCCM doc and discussed medical plan.  Patient had recruitment maneuvers last few days without improvement, on heparin gtt for possible PE.  02/18/22- patient continues to require maximal settings on ventilator despite good UOP.  He destaturated to <50% spO2 on MV today required BGV.    02/19/22- patient continues to require 100% FiO2 on maximal setting with high PEEP ladder for possible ARDS.  He was diuresed and on steroids with development of AKI.  His CXR had slight interval improvement on right. We discussed case with vascular surgery regarding possible empiric tPA for PE.  02/20/22- patient was unable to get CT today due to continued severe hypoxemia.  Daughter and other family members at bedside we reviewed case and medical plan.  There seems to be some confusion from family as they had asked me to wake patient up and take off ventilator even though I had explained multiple times that he is at very high risk for death.  They laughed at my comments and seemed to think it was not true. We may still be able to do bronchoscopy but its high risk.  We have reduced IV infusions by switching some medications to OGT route.  02/21/22- patient is weaned to 60%FiO2.  Plan for possible bronch if patient is tolerating.  Family at bedside.  If able to we will obtain more imaging and VQ scan to rule out PE.  02/22/22- Patient weaned to 50% on PRVC.  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 CBC stable , CMP with improved GFR. Monitoring Na, pharmacy and renal following for electrolytes/renal function.  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  Overall marked improvement on ventilator over past 48h. Met with daughter at bedside reviewed medical plan.  02/24/22-Vent requirements continue to slowly improve, currently 40% FiO2 and 14 PEEP.  AKI continues to slowly improve, remains hyperkalemic with potassium of 5.8.  Placed back on  Veltassa, Diurese x1.  Trach scheduled for tomorrow at 1:15 PM 02/25/22- Diurese with Lasix x1 dose. Trach scheduled for today. 02/26/22: Remains mechanically ventilated via newly placed tracheostomy will attempt to wean ventilator settings today.  Perform WUA  02/26/22: Pt remains mechanically  ventilated via tracheostomy vent settings: PEEP 14/FiO2 80% 02/27/22: Fevers, ID consulted. Repeating Tracheal aspirate and UA.  Zosyn started 02/28/22: Persistent fevers, Tracheal aspirate with Staph aureus, start Linezolid.  Worsening Creatinine, considering Lasix gtt given high vent requirements (100% FiO2, 14 peep).  D/c fentanyl gtt and decrease oral benzos/narcotics 03/01/22: worsening anuric renal failure. HD line placed and started on hemodialysis. 03/02/22: Vent support slowly improving. Plan for HD again today. 8/14-8/15: remains febrile, encephalopathic despite HD sessions 8/16 fever curve improved, clots noted in LUE 8/18: continued low grade fever (t max 100.6).  Receiving HD.  Slight improvement in encephalopathy 8/19 off pressors, off sedation, remains on vent  8/20: Afebrile, tolerating PSV 8/21: Remains afebrile, obtain CT Abdomen for IR to evaluate for PEG placement.  Tolerating PSV (15/5).  Consult PT for passive ROM 8/22: Extremely weak, but able to MAE to command, nodding to questions. Tolerating PSV, will trial TC. Deemed not candidate for IR placement of PEG, will have General Surgery evaluate 8/25: Tolerating TC during day and vent at night.  Strength improving.  Plan for Robotic assisted laparoscopic gastrostomy tube placement by General Surgery, heparin currently on hold for procedure.  Consults:  PCCM Nephrology Vascular Surgery Palliative Care ENT Infectious Disease  Procedures:  7/25: Intubation 7/26: PICC triple-lumen, right basilic vein 2/48: Left radial arterial line  8/08: Size 8 proximal XLT [place] inserted 8/12: Right IJ HD catheter placed  Significant Diagnostic Tests:  7/25  Chest Xray:Chest x-ray showed cardiomegaly and pulmonary vascular congestion without overt pulmonary edema 7/25 Echocardiogram: difficult study, LVEF was estimated at 55 to 60%, dilated LV moderate LVH, grade 1 DD, enlargement of the right ventricle, left atrial size moderately  dilated, right atrial size moderately dilated this is consistent with restrictive physiology (echo reading not congruous with prior echoes from [facility]) 7/29: Venous US BLE>>IMPRESSION: No lower extremity DVT. 7/30 Limited echo: LVEF 40 to 45% decreased LV function, no wall motion abnormalities, moderate dilation of the LV concentric LVH, right ventricular size moderately enlarged, right atrial enlargement, this is more consistent with prior [facility] scans 8/4: CT Head>>No acute intracranial abnormality. Paranasal sinusitis. Bilateral complete opacification of the mastoid air cells and inner ears, as can be seen in the setting of otitis media. Correlate with symptoms. 8/4: CT Chest/Abdomen/Pelvis>>Extensive ground-glass, alveolar and patchy dense infiltrates in both lungs suggesting multifocal pneumonia. Part of this finding may suggest underlying pulmonary edema. Small bilateral pleural effusions are seen. Cardiomegaly. There is ectasia of the main pulmonary artery suggesting pulmonary arterial hypertension. There is no evidence of intestinal obstruction or pneumoperitoneum. There is no hydronephrosis. Appendix is not dilated. UB diverticula are seen in the colon without signs of focal diverticulitis. Severe degenerative changes are noted at L4-L5 level with significant interval worsening. Findings may be due to severe disc degeneration. If there is clinical suspicion for discitis or osteomyelitis, follow-up MRI may be considered 8/4: Lung V/Q>>Pulmonary embolism absent. 8/16: RUE brachial and axillary vein DVT associated with PICC line 8/22: General Surgery consulted tentatively scheduled for G tube placement on 08/25 8/23: No acute events overnight tolerating SBT 10/5 will attempt to transition to TCT today  8/24: No acute events overnight continues to tolerate SBT, will attempt TCT as tolerated  Micro Data:  7/25: SARS-CoV-2 PCR> negative 7/25: MRSA PCR>> NEG 7/27:  Strep pneumoniae Ag>> negative 7/27: Legionella Ag>> negative 7/27: Mycoplasma>> <770 7/27: Sputum>> Strep pneumo,Haemophilus influenzae 7/30 Sputum cult >> negative 8/4: Tracheal aspirate>>negative 8/9: Tracheal aspirate>>MRSA 8/10: RVP>>negative 8/10: Right nasal Wound culture>>STAPHYLOCOCCUS AUREUS, PROVOTELLA 8/10: MRSA PCR>> Positive 8/11: Blood culture 1/4>>Staphylococcus epidermidis MecA/C (suspect contaminant)  Antimicrobials:  Cefepime 7/27 >>7/31 Ceftriaxone 7/31>>8/4 Vancomycin 7/27 >> 7/29, restarted 7/30 (resumed due to fever spike on Maxipime) ? Resistant Strep pneumo >> 7/31 Unasyn 8/14>>8/17 Augmentin 8/17>>8/21 Linezolid 8/11>>8/20  Interim History / Subjective:  As outlined above under significant events   Objective   Blood pressure (!) 151/87, pulse 76, temperature 98.9 F (37.2 C), temperature source Oral, resp. rate 16, height 5\' 9"  (1.753 m), weight (!) 204 kg, SpO2 94 %.    Vent Mode: PSV FiO2 (%):  [40 %-50 %] 40 % PEEP:  [5 cmH20] 5 cmH20 Pressure Support:  [10 cmH20] 10 cmH20   Intake/Output Summary (Last 24 hours) at 03/14/2022 03/16/2022 Last data filed at 03/14/2022 0900 Gross per 24 hour  Intake 621.15 ml  Output 3450 ml  Net -2828.85 ml    Filed Weights    Examination: GENERAL: Acute on chronically ill appearing male, awake and alert, on TC, in NAD HENT: Atraumatic, normocephalic, neck supple, 8 XLT Tracheostomy dressing clean, dry and intact LUNGS: Coarse throughout, even, non labored  CV: NSR, RRR, s1s2, no M/R/G, 2+ generalized edema  ABDOMEN: Obese, soft, nontender, no guarding or rebound tenderness, BS+ x4 MUSCULOSKELETAL: Generalized weakness, moves all extremities  NEURO:  Alert, following/MAE to commands, PERRL  Resolved Hospital Problem list   Community-acquired pneumonia: Streptococcus pneumoniae and Haemophilus influenzae ~ TREATED MRSA HCAP ~ TREATED Provetella Sinusitis ~ TREATED Hypernatremia  Assessment & Plan:  Acute on  chronic hypoxemic and hypercapneic respiratory failure now s/p tracheostomy PMHx: OSA/OHS - Full vent support, implement lung protective strategies - Plateau pressures less than 30 cm H20 - Wean FiO2 & PEEP as tolerated to maintain O2 sats >92% - Follow intermittent Chest X-ray & ABG as needed - SBT or TCT as tolerated  - Implement VAP Bundle - Prn Bronchodilators  MRSA HCAP- completed linezolid x 7 days Provetella sinusitis- completed unasyn/augmentin Persistent low grade fever, suspect due to RUE DVT ~ RESOLVED - Trend WBC and monitor fever curve  - ID following, appreciate input   Acute renal failure with hyperkalemia now on iHD - Trend BMP - Replace electrolytes as indicated - Monitor UOP: improving  - Nephrology consulted appreciate input: HD per recommendations    DM2 with hyperglycemia - CBG's q4h; Target range of 140 to 180 - SSI & Semglee - Follow ICU Hypo/Hyperglycemia protocol  Ongoing severe toxic encephalopathy- 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  Still uremic.  EEG 8/16 neg.  - Avoid sedating medications as able - PT/OT following - Mobilize as able  PICC associated DVT- PICC removed - Heparin gtt - Once PEG completed, can transition to 9/15 (right click and "Reselect all SmartList Selections" daily)   Diet/type: NPO pending G tube placement  DVT prophylaxis: Systemic Heparin on hold for procedure 8/25.  Discussed with Dr. 9/25, can restart Heparin in the am on 8/26 after General Surgery evaluates. GI prophylaxis: PPI Lines: Discontinued HD catheter 03/12/22 Foley: N/A Code Status:  full code Last date of multidisciplinary goals of care discussion [03/14/22]  Long term prognosis guarded  Critical Care Time: 30 minutes  Darel Hong, AGACNP-BC [facility] Prefer epic messenger for cross cover needs If after hours, please call E-link

## 2022-03-15 DIAGNOSIS — J9621 Acute and chronic respiratory failure with hypoxia: Secondary | ICD-10-CM | POA: Diagnosis not present

## 2022-03-15 LAB — RENAL FUNCTION PANEL
Albumin: 2.5 g/dL — ABNORMAL LOW (ref 3.5–5.0)
Anion gap: 10 (ref 5–15)
BUN: 54 mg/dL — ABNORMAL HIGH (ref 6–20)
CO2: 28 mmol/L (ref 22–32)
Calcium: 8.4 mg/dL — ABNORMAL LOW (ref 8.9–10.3)
Chloride: 111 mmol/L (ref 98–111)
Creatinine, Ser: 2.02 mg/dL — ABNORMAL HIGH (ref 0.61–1.24)
GFR, Estimated: 41 mL/min — ABNORMAL LOW (ref >60)
Glucose, Bld: 93 mg/dL (ref 70–99)
Phosphorus: 6 mg/dL — ABNORMAL HIGH (ref 2.5–4.6)
Potassium: 4.4 mmol/L (ref 3.5–5.1)
Sodium: 149 mmol/L — ABNORMAL HIGH (ref 135–145)

## 2022-03-15 LAB — CBC WITH DIFFERENTIAL/PLATELET
Abs Immature Granulocytes: 0.02 10*3/uL (ref 0.00–0.07)
Basophils Absolute: 0 10*3/uL (ref 0.0–0.1)
Basophils Relative: 0 %
Eosinophils Absolute: 0.2 10*3/uL (ref 0.0–0.5)
Eosinophils Relative: 4 %
HCT: 32 % — ABNORMAL LOW (ref 39.0–52.0)
Hemoglobin: 9.1 g/dL — ABNORMAL LOW (ref 13.0–17.0)
Immature Granulocytes: 0 %
Lymphocytes Relative: 12 %
Lymphs Abs: 0.7 10*3/uL (ref 0.7–4.0)
MCH: 27 pg (ref 26.0–34.0)
MCHC: 28.4 g/dL — ABNORMAL LOW (ref 30.0–36.0)
MCV: 95 fL (ref 80.0–100.0)
Monocytes Absolute: 0.6 10*3/uL (ref 0.1–1.0)
Monocytes Relative: 10 %
Neutro Abs: 4.6 10*3/uL (ref 1.7–7.7)
Neutrophils Relative %: 74 %
Platelets: 249 10*3/uL (ref 150–400)
RBC: 3.37 MIL/uL — ABNORMAL LOW (ref 4.22–5.81)
RDW: 16.4 % — ABNORMAL HIGH (ref 11.5–15.5)
WBC: 6.3 10*3/uL (ref 4.0–10.5)
nRBC: 0 % (ref 0.0–0.2)

## 2022-03-15 LAB — C-REACTIVE PROTEIN: CRP: 2.6 mg/dL — ABNORMAL HIGH (ref <1.0)

## 2022-03-15 LAB — GLUCOSE, CAPILLARY
Glucose-Capillary: 80 mg/dL (ref 70–99)
Glucose-Capillary: 84 mg/dL (ref 70–99)
Glucose-Capillary: 88 mg/dL (ref 70–99)
Glucose-Capillary: 88 mg/dL (ref 70–99)
Glucose-Capillary: 89 mg/dL (ref 70–99)
Glucose-Capillary: 89 mg/dL (ref 70–99)

## 2022-03-15 LAB — HEPARIN LEVEL (UNFRACTIONATED): Heparin Unfractionated: 0.38 IU/mL (ref 0.30–0.70)

## 2022-03-15 LAB — PHOSPHORUS: Phosphorus: 6 mg/dL — ABNORMAL HIGH (ref 2.5–4.6)

## 2022-03-15 LAB — MAGNESIUM: Magnesium: 2 mg/dL (ref 1.7–2.4)

## 2022-03-15 MED ORDER — LABETALOL HCL 5 MG/ML IV SOLN
10.0000 mg | INTRAVENOUS | Status: DC | PRN
  Administered 2022-03-15 – 2022-03-16 (×3): 10 mg via INTRAVENOUS
  Filled 2022-03-15 (×3): qty 4

## 2022-03-15 MED ORDER — HEPARIN (PORCINE) 25000 UT/250ML-% IV SOLN
2900.0000 [IU]/h | INTRAVENOUS | Status: DC
  Administered 2022-03-15 (×2): 2900 [IU]/h via INTRAVENOUS
  Filled 2022-03-15 (×3): qty 250

## 2022-03-15 MED ORDER — DEXTROSE 5 % IV SOLN
INTRAVENOUS | Status: DC
Start: 2022-03-15 — End: 2022-03-16

## 2022-03-15 NOTE — Progress Notes (Signed)
[place] for IV Heparin  Indication: RUE DVT associated with PICC  Patient Measurements: Height: 5\' 9"  (175.3 cm) Weight:  (scale not working) IBW/kg (Calculated) : 70.7 Heparin Dosing Weight: 123.4 kg   Labs: Recent Labs    03/13/22 0339 03/13/22 0348 03/14/22 0551 03/15/22 0556 03/15/22 2105  HGB 8.1*  --  8.6* 9.1*  --   HCT 28.0*  --  29.7* 32.0*  --   PLT 200  --  208 249  --   HEPARINUNFRC 0.51  --  0.52  --  0.38  CREATININE  --  2.65* 2.29* 2.02*  --    Estimated Creatinine Clearance: 81.8 mL/min (A) (by C-G formula based on SCr of 2.02 mg/dL (H)).  Medical History: Past Medical History:  Diagnosis Date   Diabetes mellitus without complication ([place])    Hypertension    Pancreatitis    Assessment: Patient is a 46 y/o M with medical history including DM, HTN, pancreatitis, tobacco use disorder, systolic CHF, OSA, DM c/b diabetic neuropathy, lumbar radiculopathy, morbid obesity who is admitted with acute respiratory failure in setting of acute CHF, hypertensive urgency, and pneumonia. Patient with prolonged admission on ventilator complicated by acute renal failure.  Pharmacy consulted to initiate and manage IV heparin for RUE DVT.  Heparin held for G tube placement 8/25. Cleared with surgery to re-start anticoagulation 8/26  Goal of Therapy:  Heparin level 0.3-0.7 units/ml Monitor platelets by anticoagulation protocol: Yes   Plan: HL therapeutic x 1 --Continue heparin at 2900 units/hr --HL 6 hours after re-initiation of infusion --Daily CBC per protocol while on IV heparin  Glean Salvo, PharmD Clinical Pharmacist  03/15/2022 9:46 PM

## 2022-03-15 NOTE — Consult Note (Signed)
PHARMACY CONSULT NOTE  Pharmacy Consult for Electrolyte Monitoring and Replacement   Recent Labs: Potassium (mmol/L)  Date Value  03/15/2022 4.4   Magnesium (mg/dL)  Date Value  03/15/2022 2.0   Calcium (mg/dL)  Date Value  03/15/2022 8.4 (L)   Albumin (g/dL)  Date Value  03/15/2022 2.5 (L)   Phosphorus (mg/dL)  Date Value  03/15/2022 6.0 (H)  03/15/2022 6.0 (H)   Sodium (mmol/L)  Date Value  03/15/2022 149 (H)   Assessment: Patient is a 46 y/o M with medical history including DM, HTN, pancreatitis, tobacco use disorder, systolic CHF, OSA, DM c/b diabetic neuropathy, lumbar radiculopathy, morbid obesity who is admitted with acute respiratory failure in setting of acute CHF and hypertensive urgency. Pharmacy consulted to assist with electrolyte monitoring and replacement as indicated.  Admission complicated by renal insufficiency requiring renal replacement therapy at one point which has now been discontinued with renal recovery.  Goal of Therapy:  Electrolytes within normal limits  Plan:  --New hypernatremia, defer management to PCCM / nephrology --Hyperphosphatemia maintained on Renvela per nephrology --Follow-up electrolytes with AM labs tomorrow  Benita Gutter  03/15/2022 7:59 AM

## 2022-03-15 NOTE — Progress Notes (Signed)
Occupational Therapy Treatment Patient Details Name: Samuel Chavez MRN: [id number] DOB: [birth year] Today's Date: 03/15/2022   History of present illness Pt is 57 YOM admitted for PNA, renail failure, AKI, hyperkalemia, CHF, Resp failure, sepsis, and is s/p trach placement. PMH includes: DM, HTN, sCHF, OSA, obesity, ARDS Resp failure, sepsis.   OT comments  Samuel Chavez was seen for OT treatment on this date. Upon arrival to room pt in bed, agreeable to tx. Pt requires MIN A self-feeding ice chips at bed level. MAX A don/doff socks at bed level. Pt completed bed level BUE and BLE exercises as described below. Educated on HEP and positioning for edema mgmt. Pt making good progress toward goals, will continue to follow POC. Discharge recommendation remains appropriate.     Recommendations for follow up therapy are one component of a multi-disciplinary discharge planning process, led by the attending physician.  Recommendations may be updated based on patient status, additional functional criteria and insurance authorization.    Follow Up Recommendations  Skilled nursing-short term rehab (<3 hours/day)    Assistance Recommended at Discharge Frequent or constant Supervision/Assistance  Patient can return home with the following  Two people to help with walking and/or transfers;Two people to help with bathing/dressing/bathroom   Equipment Recommendations  Other (comment) (defer)    Recommendations for Other Services      Precautions / Restrictions Precautions Precautions: Fall Restrictions Weight Bearing Restrictions: No       Mobility Bed Mobility               General bed mobility comments: recent PEG surgery, pending clearance to participate in mobility    Transfers                             ADL either performed or assessed with clinical judgement   ADL Overall ADL's : Needs assistance/impaired                                        General ADL Comments: MIN A self-feeding ice chips at bed level. MAX A don/doff socks at bed level      Cognition Arousal/Alertness: Awake/alert Behavior During Therapy: Flat affect, WFL for tasks assessed/performed Overall Cognitive Status: Within Functional Limits for tasks assessed                                 General Comments: asnwers appropriately with head nods        Exercises Exercises: General Lower Extremity, General Upper Extremity General Exercises - Upper Extremity Shoulder Flexion: AROM, Strengthening, Both, 10 reps, Supine Shoulder Extension: AROM, Strengthening, Both, 10 reps, Supine Shoulder ABduction: AROM, Strengthening, Both, 10 reps, Supine Shoulder ADduction: AROM, Strengthening, Both, 10 reps, Supine Shoulder Horizontal ABduction: AROM, Strengthening, Both, 10 reps, Supine Shoulder Horizontal ADduction: AROM, Strengthening, Both, 10 reps, Supine Elbow Flexion: AROM, Strengthening, Both, 10 reps, Supine Elbow Extension: AROM, Strengthening, Both, 10 reps, Supine Wrist Flexion: AROM, Strengthening, Both, 10 reps, Supine Wrist Extension: AROM, Strengthening, Both, 10 reps, Supine Digit Composite Flexion: AROM, Strengthening, Both, 10 reps, Supine Composite Extension: AROM, Strengthening, Both, 10 reps, Supine General Exercises - Lower Extremity Ankle Circles/Pumps: AROM, Strengthening, Both, 10 reps, Supine Quad Sets: AROM, Strengthening, Both, 10 reps, Supine Short Arc Quad: AROM, Strengthening, Both, 10 reps, Supine  Pertinent Vitals/ Pain       Pain Assessment Pain Assessment: No/denies pain   Frequency  Min 2X/week        Progress Toward Goals  OT Goals(current goals can now be found in the care plan section)  Progress towards OT goals: Progressing toward goals  Acute Rehab OT Goals Patient Stated Goal: to eat ice chips OT Goal Formulation: With patient Time For Goal Achievement: 03/26/22 Potential to  Achieve Goals: Fair ADL Goals Pt Will Perform Grooming: with min assist;bed level Pt Will Transfer to Toilet: with max assist;with +2 assist Pt/caregiver will Perform Home Exercise Program: Increased ROM;Increased strength;Right Upper extremity;Left upper extremity;With minimal assist  Plan Discharge plan remains appropriate;Frequency remains appropriate    Co-evaluation                 AM-PAC OT "6 Clicks" Daily Activity     Outcome Measure   Help from another person eating meals?: A Lot Help from another person taking care of personal grooming?: A Lot Help from another person toileting, which includes using toliet, bedpan, or urinal?: Total Help from another person bathing (including washing, rinsing, drying)?: Total Help from another person to put on and taking off regular upper body clothing?: A Lot Help from another person to put on and taking off regular lower body clothing?: A Lot 6 Click Score: 10    End of Session    OT Visit Diagnosis: Repeated falls (R29.6);Muscle weakness (generalized) (M62.81);Other abnormalities of gait and mobility (R26.89)   Activity Tolerance Patient tolerated treatment well   Patient Left in bed;with call bell/phone within reach   Nurse Communication Mobility status        Time: 1341-1413 OT Time Calculation (min): 32 min  Charges: OT General Charges $OT Visit: 1 Visit OT Treatments $Self Care/Home Management : 8-22 mins $Therapeutic Exercise: 8-22 mins  Dessie Coma, M.S. OTR/L  03/15/22, 2:58 PM  ascom [phone]

## 2022-03-15 NOTE — Progress Notes (Signed)
[place] for IV Heparin  Indication: RUE DVT associated with PICC  Patient Measurements: Height: 5\' 9"  (175.3 cm) Weight:  (scale not working) IBW/kg (Calculated) : 70.7 Heparin Dosing Weight: 123.4 kg   Labs: Recent Labs    03/13/22 0339 03/13/22 0348 03/14/22 0551 03/15/22 0556  HGB 8.1*  --  8.6* 9.1*  HCT 28.0*  --  29.7* 32.0*  PLT 200  --  208 249  HEPARINUNFRC 0.51  --  0.52  --   CREATININE  --  2.65* 2.29* 2.02*   Estimated Creatinine Clearance: 81.8 mL/min (A) (by C-G formula based on SCr of 2.02 mg/dL (H)).  Medical History: Past Medical History:  Diagnosis Date   Diabetes mellitus without complication ([place])    Hypertension    Pancreatitis    Assessment: Patient is a 46 y/o M with medical history including DM, HTN, pancreatitis, tobacco use disorder, systolic CHF, OSA, DM c/b diabetic neuropathy, lumbar radiculopathy, morbid obesity who is admitted with acute respiratory failure in setting of acute CHF, hypertensive urgency, and pneumonia. Patient with prolonged admission on ventilator complicated by acute renal failure.  Pharmacy consulted to initiate and manage IV heparin for RUE DVT.  Heparin held for G tube placement 8/25. Cleared with surgery to re-start anticoagulation 8/26  Goal of Therapy:  Heparin level 0.3-0.7 units/ml Monitor platelets by anticoagulation protocol: Yes   Plan:  --Re-start heparin at 2900 units/hr, no bolus --HL 6 hours after re-initiation of infusion --Daily CBC per protocol while on IV heparin  Benita Gutter  03/15/2022 1:04 PM

## 2022-03-15 NOTE — Progress Notes (Signed)
[facility]  PROGRESS NOTE   Subjective:   Patient seen and evaluated at bedside in ICU Remains critically ill. Alert and able to mouth words, requesting ice chips   Objective:  Vital signs: Blood pressure (!) 162/86, pulse 82, temperature 98.7 F (37.1 C), temperature source Oral, resp. rate 15, height 5\' 9"  (1.753 m), weight (!) 204 kg, SpO2 95 %.  Intake/Output Summary (Last 24 hours) at 03/15/2022 0955 Last data filed at 03/15/2022 0855 Gross per 24 hour  Intake 700 ml  Output 1360 ml  Net -660 ml   Filed Weights     Physical Exam: General:  No acute distress, resting comfortably  Head:  Normocephalic, atraumatic. Moist oral mucosal membranes  Eyes:  Anicteric  Lungs:    tracheostomy with 10L [place]  Heart:  Regular on monitor  Abdomen:   Soft, nontender,obese  Extremities: trace peripheral edema.  Neurologic: Alert.  Able to follow simple commands.  Skin:  No lesions  Access: Right IJ temp cath placed on 5/78/46    Basic Metabolic Panel: Recent Labs  Lab 03/11/22 0757 03/12/22 0709 03/13/22 0339 03/13/22 0348 03/14/22 0551 03/15/22 0556  NA 140 140  --  143 145 149*  K 4.6 4.3  --  4.4 4.1 4.4  CL 102 104  --  106 107 111  CO2 28 29  --  27 29 28   GLUCOSE 108* 132*  --  93 98 93  BUN 89* 65*  --  63* 59* 54*  CREATININE 3.69* 2.56*  --  2.65* 2.29* 2.02*  CALCIUM 7.9* 8.1*  --  8.3* 8.5* 8.4*  MG 2.4 2.2 2.2  --  2.0 2.0  PHOS 9.3*  9.2* 6.0*  6.1* 6.3* 6.4* 6.1*  6.2* 6.0*  6.0*    CBC: Recent Labs  Lab 03/11/22 0757 03/12/22 0709 03/13/22 0339 03/14/22 0551 03/15/22 0556  WBC 7.7 7.0 6.7 6.6 6.3  NEUTROABS 5.6 5.0 4.4 4.7 4.6  HGB 8.1* 8.0* 8.1* 8.6* 9.1*  HCT 27.5* 27.4* 28.0* 29.7* 32.0*  MCV 91.7 90.1 92.7 91.4 95.0  PLT 197 198 200 208 249     Urinalysis: No results for input(s): "COLORURINE", "LABSPEC", "PHURINE", "GLUCOSEU", "HGBUR", "BILIRUBINUR", "KETONESUR", "PROTEINUR", "UROBILINOGEN", "NITRITE", "LEUKOCYTESUR"  in the last 72 hours.  Invalid input(s): "APPERANCEUR"     Imaging: No results found.   Medications:    sodium chloride 10 mL/hr at 03/14/22 1036   dextrose      Chlorhexidine Gluconate Cloth  6 each Topical Q0600   insulin aspart  0-20 Units Subcutaneous Q4H   insulin aspart  4 Units Subcutaneous Q4H   insulin detemir  22 Units Subcutaneous BID   ipratropium-albuterol  3 mL Nebulization Q6H   metoprolol tartrate  5 mg Intravenous Q6H   multivitamin  1 tablet Per Tube QHS   mupirocin ointment   Nasal BID   mouth rinse  15 mL Mouth Rinse Q2H   sevelamer carbonate  2.4 g Per Tube TID WC    Assessment/ Plan:     46 y.o. male with medical problems of poorly controlled diabetes, hypertension, systolic CHF, obstructive sleep apnea, obesity    admitted on 02/11/2022 for  Principal Problem:   Acute respiratory failure with hypoxia and hypercarbia (HCC) Active Problems:   Acute on chronic systolic CHF (congestive heart failure) ([place])   Hypertensive urgency   Type 2 diabetes mellitus with peripheral neuropathy ([place])   Acute heart failure with preserved ejection fraction ([place])   Renal failure (  ARF), acute on chronic (HCC)   Hospital-acquired pneumonia   ARDS (adult respiratory distress syndrome) (HCC)   Pressure injury of skin    # Acute kidney injury: Acute kidney injury is most likely secondary to acute tubular necrosis possibly due to hypotension, fever and sepsis.  Patient became oligoanuric with metabolic acidosis and hyperkalemia.   -24 hour creatinine clearance 40, no further dialysis is needed at this time.  - Renal function stable with great urine output  # Hyperphosphatemia -Phosphorus 6.1, continue sevelamer  # Hyperkalemia:  -Potassium 4.1, monitoring.  #Chronic systolic CHF with volume overload 2D echo February 16, 2022-LVEF 40 to 45%, mild concentric LVH moderately enlarged right ventricle, moderately dilated left atrium and right atrium.   #4  Acute hypoxic  Vent dependent respiratory failure:  Now with tracheostomy on 10L trach collar   #5 Sepsis: Management as per ICU and ID teams. - completed linezolid and Augmentin.      LOS: [facility] 8/26/20239:55 AM

## 2022-03-15 NOTE — Progress Notes (Signed)
NAME:  Samuel Chavez, MRN:  [id number], DOB:  12-May-1976, LOS: 32 ADMISSION DATE:  02/11/2022  History of Present Illness:  46 y.o male with significant PMH as below who presented to the ED with chief complaints of SOB and worsening bilateral lower extremities edema.   ED Course: In the emergency department, the temperature was 37.3C, the heart rate 99 beats/minute, the blood pressure 187/102 mm Hg, the respiratory rate 20 breaths/minute, and the oxygen saturation 89% on. He was noted to be drowsy but still protecting his airway and responding appropriately.   Pertinent  Medical History   Past Medical History:  Diagnosis Date   Diabetes mellitus without complication (HCC)    Hypertension    Pancreatitis     Significant Hospital Events: Including procedures, antibiotic start and stop dates in addition to other pertinent events   7/25: Admitted to hospitalist service w/acute on chronic hypoxic hypercapnic resp. failure requiring BiPAP.Failed BiPAP and intubated. PCCM consulted 7/26: INTUBATED, difficlut to sedate, started KETAMINE INFUSION 7/27: severe hypoxia, PEEP increased to 15 but decreased back to 10 7/28: Persistent severe hypoxia, ventilator changes made 7/29: Persistent hypoxia, recruitment maneuvers instituted, ventilator changes made, empiric heparin as patient cannot be scanned query PE -02/17/22- patient is severely critically ill with bilateral multifocal pneumonia on sedation and paralysis with mechanical ventilation maximal settings.  He is at cusp of death, we met with daughter today and discussed severity of illness. Unable to perform CT due to severity of critical illness. Met with previous PCCM doc and discussed medical plan.  Patient had recruitment maneuvers last few days without improvement, on heparin gtt for possible PE.  02/18/22- patient continues to require maximal settings on ventilator despite good UOP.  He destaturated to <50% spO2 on MV today required BGV.    02/19/22- patient continues to require 100% FiO2 on maximal setting with high PEEP ladder for possible ARDS.  He was diuresed and on steroids with development of AKI.  His CXR had slight interval improvement on right. We discussed case with vascular surgery regarding possible empiric tPA for PE.  02/20/22- patient was unable to get CT today due to continued severe hypoxemia.  Daughter and other family members at bedside we reviewed case and medical plan.  There seems to be some confusion from family as they had asked me to wake patient up and take off ventilator even though I had explained multiple times that he is at very high risk for death.  They laughed at my comments and seemed to think it was not true. We may still be able to do bronchoscopy but its high risk.  We have reduced IV infusions by switching some medications to OGT route.  02/21/22- patient is weaned to 60%FiO2.  Plan for possible bronch if patient is tolerating.  Family at bedside.  If able to we will obtain more imaging and VQ scan to rule out PE.  02/22/22- Patient weaned to 50% on PRVC.  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 CBC stable , CMP with improved GFR. Monitoring Na, pharmacy and renal following for electrolytes/renal function.  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  Overall marked improvement on ventilator over past 48h. Met with daughter at bedside reviewed medical plan.  02/24/22-Vent requirements continue to slowly improve, currently 40% FiO2 and 14 PEEP.  AKI continues to slowly improve, remains hyperkalemic with potassium of 5.8.  Placed back on  Veltassa, Diurese x1.  Trach scheduled for tomorrow at 1:15 PM 02/25/22- Diurese with Lasix x1 dose. Trach scheduled for today. 02/26/22: Remains mechanically ventilated via newly placed tracheostomy will attempt to wean ventilator settings today.  Perform WUA  02/26/22: Pt remains mechanically  ventilated via tracheostomy vent settings: PEEP 14/FiO2 80% 02/27/22: Fevers, ID consulted. Repeating Tracheal aspirate and UA.  Zosyn started 02/28/22: Persistent fevers, Tracheal aspirate with Staph aureus, start Linezolid.  Worsening Creatinine, considering Lasix gtt given high vent requirements (100% FiO2, 14 peep).  D/c fentanyl gtt and decrease oral benzos/narcotics 03/01/22: worsening anuric renal failure. HD line placed and started on hemodialysis. 03/02/22: Vent support slowly improving. Plan for HD again today. 8/14-8/15: remains febrile, encephalopathic despite HD sessions 8/16 fever curve improved, clots noted in LUE 8/18: continued low grade fever (t max 100.6).  Receiving HD.  Slight improvement in encephalopathy 8/19 off pressors, off sedation, remains on vent  8/20: Afebrile, tolerating PSV 8/21: Remains afebrile, obtain CT Abdomen for IR to evaluate for PEG placement.  Tolerating PSV (15/5).  Consult PT for passive ROM 8/22: Extremely weak, but able to MAE to command, nodding to questions. Tolerating PSV, will trial TC. Deemed not candidate for IR placement of PEG, will have General Surgery evaluate 8/25: Tolerating TC during day and vent at night.  Strength improving.  Robotic assisted laparoscopic gastrostomy tube placement by General Surgery, heparin currently on hold for procedure. 8/26: tolerating vent at night, TC during day.  Awaiting Surgery approval to restart Heparin gtt today. Na increased to 149 as unable to use G-tube till tomorrow for free water flushes.  Will start low dose D5W infusion  Consults:  PCCM Nephrology Vascular Surgery Palliative Care ENT Infectious Disease  Procedures:  7/25: Intubation 7/26: PICC triple-lumen, right basilic vein 4/81: Left radial arterial line  8/08: Size 8 proximal XLT [place] inserted 8/12: Right IJ HD catheter placed 8/25: Robotic assisted laparoscopic gastrostomy tube placement by General Surgery  Significant Diagnostic  Tests:  7/25  Chest Xray:Chest x-ray showed cardiomegaly and pulmonary vascular congestion without overt pulmonary edema 7/25 Echocardiogram: difficult study, LVEF was estimated at 55 to 60%, dilated LV moderate LVH, grade 1 DD, enlargement of the right ventricle, left atrial size moderately dilated, right atrial size moderately dilated this is consistent with restrictive physiology (echo reading not congruous with prior echoes from [facility]) 7/29: Venous US BLE>>IMPRESSION: No lower extremity DVT. 7/30 Limited echo: LVEF 40 to 45% decreased LV function, no wall motion abnormalities, moderate dilation of the LV concentric LVH, right ventricular size moderately enlarged, right atrial enlargement, this is more consistent with prior [facility] scans 8/4: CT Head>>No acute intracranial abnormality. Paranasal sinusitis. Bilateral complete opacification of the mastoid air cells and inner ears, as can be seen in the setting of otitis media. Correlate with symptoms. 8/4: CT Chest/Abdomen/Pelvis>>Extensive ground-glass, alveolar and patchy dense infiltrates in both lungs suggesting multifocal pneumonia. Part of this finding may suggest underlying pulmonary edema. Small bilateral pleural effusions are seen. Cardiomegaly. There is ectasia of the main pulmonary artery suggesting pulmonary arterial hypertension. There is no evidence of intestinal obstruction or pneumoperitoneum. There is no hydronephrosis. Appendix is not dilated. UB diverticula are seen in the colon without signs of focal diverticulitis. Severe degenerative changes are noted at L4-L5 level with significant interval worsening. Findings may be due to severe disc degeneration. If there is clinical suspicion for discitis or osteomyelitis, follow-up MRI may be considered 8/4: Lung V/Q>>Pulmonary embolism absent. 8/16: RUE brachial and axillary vein  DVT associated with PICC line 8/22: General Surgery consulted tentatively scheduled  for G tube placement on 08/25 8/23: No acute events overnight tolerating SBT 10/5 will attempt to transition to TCT today  8/24: No acute events overnight continues to tolerate SBT, will attempt TCT as tolerated   Micro Data:  7/25: SARS-CoV-2 PCR> negative 7/25: MRSA PCR>> NEG 7/27: Strep pneumoniae Ag>> negative 7/27: Legionella Ag>> negative 7/27: Mycoplasma>> <770 7/27: Sputum>> Strep pneumo,Haemophilus influenzae 7/30 Sputum cult >> negative 8/4: Tracheal aspirate>>negative 8/9: Tracheal aspirate>>MRSA 8/10: RVP>>negative 8/10: Right nasal Wound culture>>STAPHYLOCOCCUS AUREUS, PROVOTELLA 8/10: MRSA PCR>> Positive 8/11: Blood culture 1/4>>Staphylococcus epidermidis MecA/C (suspect contaminant)  Antimicrobials:  Cefepime 7/27 >>7/31 Ceftriaxone 7/31>>8/4 Vancomycin 7/27 >> 7/29, restarted 7/30 (resumed due to fever spike on Maxipime) ? Resistant Strep pneumo >> 7/31 Unasyn 8/14>>8/17 Augmentin 8/17>>8/21 Linezolid 8/11>>8/20  Interim History / Subjective:  As outlined above under significant events   Objective   Blood pressure (!) 162/86, pulse 82, temperature 98.7 F (37.1 C), temperature source Oral, resp. rate 15, height $RemoveBe'5\' 9"'PVCPqDNlM$  (1.753 m), weight (!) 204 kg, SpO2 95 %.    Vent Mode: PSV FiO2 (%):  [40 %-98 %] 40 % Set Rate:  [16 bmp] 16 bmp Vt Set:  [500 mL] 500 mL PEEP:  [5 cmH20] 5 cmH20 Pressure Support:  [10 cmH20] 10 cmH20   Intake/Output Summary (Last 24 hours) at 03/15/2022 0941 Last data filed at 03/15/2022 0855 Gross per 24 hour  Intake 700 ml  Output 1360 ml  Net -660 ml    Filed Weights    Examination: GENERAL: Acute on chronically ill appearing male, awake and alert, on TC, in NAD HENT: Atraumatic, normocephalic, neck supple, 8 XLT Tracheostomy dressing clean, dry and intact LUNGS: Coarse throughout, even, non labored  CV: NSR, RRR, s1s2, no M/R/G, 2+ generalized edema  ABDOMEN: Obese, soft, nontender, no guarding or rebound tenderness, BS+  x4 MUSCULOSKELETAL: Generalized weakness, moves all extremities  NEURO:  Alert, following/MAE to commands, PERRL  Resolved Hospital Problem list   Community-acquired pneumonia: Streptococcus pneumoniae and Haemophilus influenzae ~ TREATED MRSA HCAP ~ TREATED Provetella Sinusitis ~ TREATED Hypernatremia  Assessment & Plan:  Acute on chronic hypoxemic and hypercapneic respiratory failure now s/p tracheostomy PMHx: OSA/OHS - Full vent support, implement lung protective strategies - Plateau pressures less than 30 cm H20 - Wean FiO2 & PEEP as tolerated to maintain O2 sats >92% - Follow intermittent Chest X-ray & ABG as needed - SBT or TCT as tolerated  - Implement VAP Bundle - Prn Bronchodilators  MRSA HCAP- completed linezolid x 7 days Provetella sinusitis- completed unasyn/augmentin Persistent low grade fever, suspect due to RUE DVT ~ RESOLVED - Trend WBC and monitor fever curve  - ID following, appreciate input   Acute renal failure with hyperkalemia now on iHD Hypernatremia - Trend BMP - Replace electrolytes as indicated - Monitor UOP: improving  - Nephrology consulted appreciate input: HD per recommendations -Unable to use newly placed G-tube until 8/27 for free water flushes, will start D5W    DM2 with hyperglycemia - CBG's q4h; Target range of 140 to 180 - SSI & Semglee - Follow ICU Hypo/Hyperglycemia protocol  Ongoing severe toxic encephalopathy- 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  Still uremic.  EEG 8/16 neg.  - Avoid sedating medications as able - PT/OT following - Mobilize as able  PICC associated DVT- PICC removed - Heparin gtt - Once PEG completed, can transition to [employer] (  right click and "Reselect all SmartList Selections" daily)   Diet/type: NPO,G tube placement 8/25 (general surgery would like to wait until 8/27 before using)  DVT prophylaxis: Systemic Heparin on hold due  to newly placed G-tube.   Discussed with Dr. Lysle Pearl, can restart Heparin in the am on 8/26 after General Surgery evaluates. GI prophylaxis: PPI Lines: Discontinued HD catheter 03/12/22 Foley: N/A Code Status:  full code Last date of multidisciplinary goals of care discussion [03/14/22]  Long term prognosis guarded  Critical Care Time: 30 minutes  Darel Hong, AGACNP-BC [facility] Prefer epic messenger for cross cover needs If after hours, please call E-link

## 2022-03-15 NOTE — Progress Notes (Signed)
Patient ID: Samuel Chavez, male   DOB: 05/13/1976, 46 y.o.   MRN: [id number]  Patient is s/p robotic assisted laparoscopic gastrostomy placement yesterday.  He was evaluated this morning.  Patient is alert, no distress.  The gastrostomy tube wound is dry and clean.  No sign of bleeding.  May resume heparin drip as per primary team.  We will continue to follow.  Plan to reevaluate tomorrow and if no concern, may consider restart enteral feedings.  Herbert Pun, MD, FACS

## 2022-03-16 LAB — CBC WITH DIFFERENTIAL/PLATELET
Abs Immature Granulocytes: 0.02 10*3/uL (ref 0.00–0.07)
Basophils Absolute: 0 10*3/uL (ref 0.0–0.1)
Basophils Relative: 1 %
Eosinophils Absolute: 0.3 10*3/uL (ref 0.0–0.5)
Eosinophils Relative: 4 %
HCT: 28.7 % — ABNORMAL LOW (ref 39.0–52.0)
Hemoglobin: 8.2 g/dL — ABNORMAL LOW (ref 13.0–17.0)
Immature Granulocytes: 0 %
Lymphocytes Relative: 14 %
Lymphs Abs: 1 10*3/uL (ref 0.7–4.0)
MCH: 27.1 pg (ref 26.0–34.0)
MCHC: 28.6 g/dL — ABNORMAL LOW (ref 30.0–36.0)
MCV: 94.7 fL (ref 80.0–100.0)
Monocytes Absolute: 0.7 10*3/uL (ref 0.1–1.0)
Monocytes Relative: 11 %
Neutro Abs: 4.6 10*3/uL (ref 1.7–7.7)
Neutrophils Relative %: 70 %
Platelets: 228 10*3/uL (ref 150–400)
RBC: 3.03 MIL/uL — ABNORMAL LOW (ref 4.22–5.81)
RDW: 16.5 % — ABNORMAL HIGH (ref 11.5–15.5)
WBC: 6.7 10*3/uL (ref 4.0–10.5)
nRBC: 0 % (ref 0.0–0.2)

## 2022-03-16 LAB — RENAL FUNCTION PANEL
Albumin: 2.4 g/dL — ABNORMAL LOW (ref 3.5–5.0)
Anion gap: 8 (ref 5–15)
BUN: 47 mg/dL — ABNORMAL HIGH (ref 6–20)
CO2: 29 mmol/L (ref 22–32)
Calcium: 8 mg/dL — ABNORMAL LOW (ref 8.9–10.3)
Chloride: 113 mmol/L — ABNORMAL HIGH (ref 98–111)
Creatinine, Ser: 1.61 mg/dL — ABNORMAL HIGH (ref 0.61–1.24)
GFR, Estimated: 54 mL/min — ABNORMAL LOW (ref >60)
Glucose, Bld: 105 mg/dL — ABNORMAL HIGH (ref 70–99)
Phosphorus: 4.3 mg/dL (ref 2.5–4.6)
Potassium: 4.1 mmol/L (ref 3.5–5.1)
Sodium: 150 mmol/L — ABNORMAL HIGH (ref 135–145)

## 2022-03-16 LAB — GLUCOSE, CAPILLARY
Glucose-Capillary: 97 mg/dL (ref 70–99)
Glucose-Capillary: 101 mg/dL — ABNORMAL HIGH (ref 70–99)
Glucose-Capillary: 104 mg/dL — ABNORMAL HIGH (ref 70–99)
Glucose-Capillary: 110 mg/dL — ABNORMAL HIGH (ref 70–99)
Glucose-Capillary: 104 mg/dL — ABNORMAL HIGH (ref 70–99)
Glucose-Capillary: 131 mg/dL — ABNORMAL HIGH (ref 70–99)

## 2022-03-16 LAB — HEPARIN LEVEL (UNFRACTIONATED): Heparin Unfractionated: 0.48 IU/mL (ref 0.30–0.70)

## 2022-03-16 LAB — MAGNESIUM: Magnesium: 1.8 mg/dL (ref 1.7–2.4)

## 2022-03-16 MED ORDER — APIXABAN 5 MG PO TABS
5.0000 mg | ORAL_TABLET | Freq: Two times a day (BID) | ORAL | Status: DC
  Administered 2022-03-16 – 2022-03-24 (×18): 5 mg
  Filled 2022-03-16 (×19): qty 1

## 2022-03-16 MED ORDER — PROSOURCE TF20 ENFIT COMPATIBL EN LIQD
60.0000 mL | Freq: Three times a day (TID) | ENTERAL | Status: DC
  Administered 2022-03-16 – 2022-03-21 (×14): 60 mL
  Filled 2022-03-16: qty 60

## 2022-03-16 MED ORDER — FREE WATER: 200.0000 mL | Status: DC

## 2022-03-16 MED ORDER — FREE WATER
250.0000 mL | Status: DC
Start: 2022-03-16 — End: 2022-03-16

## 2022-03-16 MED ORDER — VITAL 1.5 CAL PO LIQD
1000.0000 mL | ORAL | Status: DC
  Administered 2022-03-16: 40 mL
  Administered 2022-03-18 – 2022-03-21 (×4): 1000 mL

## 2022-03-16 MED ORDER — FREE WATER
125.0000 mL | Status: DC
Start: 2022-03-16 — End: 2022-03-17
  Administered 2022-03-16 – 2022-03-17 (×10): 125 mL

## 2022-03-16 MED ORDER — MAGNESIUM SULFATE 2 GM/50ML IV SOLN
2.0000 g | Freq: Once | INTRAVENOUS | Status: AC
  Administered 2022-03-16: 2 g via INTRAVENOUS
  Filled 2022-03-16: qty 50

## 2022-03-16 MED ORDER — JUVEN PO PACK
1.0000 | PACK | Freq: Two times a day (BID) | ORAL | Status: DC
  Administered 2022-03-17 – 2022-03-23 (×13): 1

## 2022-03-16 NOTE — Anesthesia Postprocedure Evaluation (Signed)
Anesthesia Post Note  Patient: Samuel Chavez  Procedure(s) Performed: XI ROBOT ASSISTED GASTROSTOMY TUBE PLACEMENT  Patient location during evaluation: ICU Anesthesia Type: General Level of consciousness: awake and alert and patient cooperative Pain management: pain level controlled Vital Signs Assessment: post-procedure vital signs reviewed and stable Respiratory status: patient connected to tracheostomy mask oxygen Cardiovascular status: stable Postop Assessment: no apparent nausea or vomiting Anesthetic complications: no   No notable events documented.   Last Vitals:  Vitals:   03/16/22 0620 03/16/22 0700  BP: (!) 158/89 (!) 161/96  Pulse: 92 83  Resp: 16 19  Temp:    SpO2: 94% 95%    Last Pain:  Vitals:   03/16/22 0400  TempSrc: Oral  PainSc: 0-No pain                 Darrin Nipper

## 2022-03-16 NOTE — Progress Notes (Signed)
Patient ID: Samuel Chavez, male   DOB: Aug 14, 1975, 46 y.o.   MRN: [id number] Patient evaluated, he is alert.  Patient stable.  There has been no sign of bleeding.  Stable hemoglobin.  Left hand shows gastrostomy wound and tube seems to be in place without any complication.  The abdomen is soft, obese but no sign of distention.  I agree to start enteral feedings.  Dr. Lysle Pearl will continue following.  Herbert Pun, MD, FACS

## 2022-03-16 NOTE — Plan of Care (Signed)
Continuing with plan of care. 

## 2022-03-16 NOTE — Progress Notes (Signed)
NAME:  Samuel Chavez, MRN:  [id number], DOB:  13-Apr-1976, LOS: 33 ADMISSION DATE:  02/11/2022  History of Present Illness:  46 y.o male with significant PMH as below who presented to the ED with chief complaints of SOB and worsening bilateral lower extremities edema.   ED Course: In the emergency department, the temperature was 37.3C, the heart rate 99 beats/minute, the blood pressure 187/102 mm Hg, the respiratory rate 20 breaths/minute, and the oxygen saturation 89% on. He was noted to be drowsy but still protecting his airway and responding appropriately.   Pertinent  Medical History   Past Medical History:  Diagnosis Date   Diabetes mellitus without complication (HCC)    Hypertension    Pancreatitis     Significant Hospital Events: Including procedures, antibiotic start and stop dates in addition to other pertinent events   7/25: Admitted to hospitalist service w/acute on chronic hypoxic hypercapnic resp. failure requiring BiPAP.Failed BiPAP and intubated. PCCM consulted 7/26: INTUBATED, difficlut to sedate, started KETAMINE INFUSION 7/27: severe hypoxia, PEEP increased to 15 but decreased back to 10 7/28: Persistent severe hypoxia, ventilator changes made 7/29: Persistent hypoxia, recruitment maneuvers instituted, ventilator changes made, empiric heparin as patient cannot be scanned query PE -02/17/22- patient is severely critically ill with bilateral multifocal pneumonia on sedation and paralysis with mechanical ventilation maximal settings.  He is at cusp of death, we met with daughter today and discussed severity of illness. Unable to perform CT due to severity of critical illness. Met with previous PCCM doc and discussed medical plan.  Patient had recruitment maneuvers last few days without improvement, on heparin gtt for possible PE.  02/18/22- patient continues to require maximal settings on ventilator despite good UOP.  He destaturated to <50% spO2 on MV today required BGV.    02/19/22- patient continues to require 100% FiO2 on maximal setting with high PEEP ladder for possible ARDS.  He was diuresed and on steroids with development of AKI.  His CXR had slight interval improvement on right. We discussed case with vascular surgery regarding possible empiric tPA for PE.  02/20/22- patient was unable to get CT today due to continued severe hypoxemia.  Daughter and other family members at bedside we reviewed case and medical plan.  There seems to be some confusion from family as they had asked me to wake patient up and take off ventilator even though I had explained multiple times that he is at very high risk for death.  They laughed at my comments and seemed to think it was not true. We may still be able to do bronchoscopy but its high risk.  We have reduced IV infusions by switching some medications to OGT route.  02/21/22- patient is weaned to 60%FiO2.  Plan for possible bronch if patient is tolerating.  Family at bedside.  If able to we will obtain more imaging and VQ scan to rule out PE.  02/22/22- Patient weaned to 50% on PRVC.  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 CBC stable , CMP with improved GFR. Monitoring Na, pharmacy and renal following for electrolytes/renal function.  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  Overall marked improvement on ventilator over past 48h. Met with daughter at bedside reviewed medical plan.  02/24/22-Vent requirements continue to slowly improve, currently 40% FiO2 and 14 PEEP.  AKI continues to slowly improve, remains hyperkalemic with potassium of 5.8.  Placed back on  Veltassa, Diurese x1.  Trach scheduled for tomorrow at 1:15 PM 02/25/22- Diurese with Lasix x1 dose. Trach scheduled for today. 02/26/22: Remains mechanically ventilated via newly placed tracheostomy will attempt to wean ventilator settings today.  Perform WUA  02/26/22: Pt remains mechanically  ventilated via tracheostomy vent settings: PEEP 14/FiO2 80% 02/27/22: Fevers, ID consulted. Repeating Tracheal aspirate and UA.  Zosyn started 02/28/22: Persistent fevers, Tracheal aspirate with Staph aureus, start Linezolid.  Worsening Creatinine, considering Lasix gtt given high vent requirements (100% FiO2, 14 peep).  D/c fentanyl gtt and decrease oral benzos/narcotics 03/01/22: worsening anuric renal failure. HD line placed and started on hemodialysis. 03/02/22: Vent support slowly improving. Plan for HD again today. 8/14-8/15: remains febrile, encephalopathic despite HD sessions 8/16 fever curve improved, clots noted in LUE 8/18: continued low grade fever (t max 100.6).  Receiving HD.  Slight improvement in encephalopathy 8/19 off pressors, off sedation, remains on vent  8/20: Afebrile, tolerating PSV 8/21: Remains afebrile, obtain CT Abdomen for IR to evaluate for PEG placement.  Tolerating PSV (15/5).  Consult PT for passive ROM 8/22: Extremely weak, but able to MAE to command, nodding to questions. Tolerating PSV, will trial TC. Deemed not candidate for IR placement of PEG, will have General Surgery evaluate 8/23: No acute events overnight tolerating SBT 10/5 will attempt to transition to TCT today  8/24: No acute events overnight continues to tolerate SBT, will attempt TCT as tolerated  8/25: Tolerating TC during day and vent at night.  Strength improving.  Robotic assisted laparoscopic gastrostomy tube placement by General Surgery, heparin currently on hold for procedure. 8/26: tolerating vent at night, TC during day.  Awaiting Surgery approval to restart Heparin gtt today. Na increased to 149 as unable to use G-tube till tomorrow for free water flushes.  Will start low dose D5W infusion 8/27: Tolerated TC all day and throughout night yesterday, General Surgery approves use of G-tube for meds and feeds.  Transition Heparin gtt to Eliquis.  Resume feeds and free water flushes  Consults:   PCCM Nephrology Vascular Surgery Palliative Care ENT Infectious Disease General Surgery  Procedures:  7/25: Intubation 7/26: PICC triple-lumen, right basilic vein 2/68: Left radial arterial line  8/08: Size 8 proximal XLT [place] inserted 8/12: Right IJ HD catheter placed 8/25: Robotic assisted laparoscopic gastrostomy tube placement by General Surgery  Significant Diagnostic Tests:  7/25  Chest Xray:Chest x-ray showed cardiomegaly and pulmonary vascular congestion without overt pulmonary edema 7/25 Echocardiogram: difficult study, LVEF was estimated at 55 to 60%, dilated LV moderate LVH, grade 1 DD, enlargement of the right ventricle, left atrial size moderately dilated, right atrial size moderately dilated this is consistent with restrictive physiology (echo reading not congruous with prior echoes from [facility]) 7/29: Venous US BLE>>IMPRESSION: No lower extremity DVT. 7/30 Limited echo: LVEF 40 to 45% decreased LV function, no wall motion abnormalities, moderate dilation of the LV concentric LVH, right ventricular size moderately enlarged, right atrial enlargement, this is more consistent with prior [facility] scans 8/4: CT Head>>No acute intracranial abnormality. Paranasal sinusitis. Bilateral complete opacification of the mastoid air cells and inner ears, as can be seen in the setting of otitis media. Correlate with symptoms. 8/4: CT Chest/Abdomen/Pelvis>>Extensive ground-glass, alveolar and patchy dense infiltrates in both lungs suggesting multifocal pneumonia. Part of this finding may suggest underlying pulmonary edema. Small bilateral pleural effusions are seen. Cardiomegaly. There is ectasia of the main pulmonary artery suggesting pulmonary arterial hypertension. There is no evidence of intestinal obstruction or pneumoperitoneum.  There is no hydronephrosis. Appendix is not dilated. UB diverticula are seen in the colon without signs of focal diverticulitis.  Severe degenerative changes are noted at L4-L5 level with significant interval worsening. Findings may be due to severe disc degeneration. If there is clinical suspicion for discitis or osteomyelitis, follow-up MRI may be considered 8/4: Lung V/Q>>Pulmonary embolism absent. 8/15: Venous US BLE>>No evidence of deep venous thrombosis in either lower extremity 8/16: Venous US BLE>>1. Non-occlusive/nearly occlusive thrombus in one of paired right upper arm brachial veins containing the indwelling PICC line and occlusive thrombus in the right axillary vein. The visualized segment of the right subclavian vein is patent. 2. No evidence of left upper extremity DVT.  Micro Data:  7/25: SARS-CoV-2 PCR> negative 7/25: MRSA PCR>> NEG 7/27: Strep pneumoniae Ag>> negative 7/27: Legionella Ag>> negative 7/27: Mycoplasma>> <770 7/27: Sputum>> Strep pneumo,Haemophilus influenzae 7/30 Sputum cult >> negative 8/4: Tracheal aspirate>>negative 8/9: Tracheal aspirate>>MRSA 8/10: RVP>>negative 8/10: Right nasal Wound culture>>STAPHYLOCOCCUS AUREUS, PROVOTELLA 8/10: MRSA PCR>> Positive 8/11: Blood culture 1/4>>Staphylococcus epidermidis MecA/C (suspect contaminant)  Antimicrobials:  Cefepime 7/27 >>7/31 Ceftriaxone 7/31>>8/4 Vancomycin 7/27 >> 7/29, restarted 7/30 (resumed due to fever spike on Maxipime) ? Resistant Strep pneumo >> 7/31 Unasyn 8/14>>8/17 Augmentin 8/17>>8/21 Linezolid 8/11>>8/20  Interim History / Subjective:  As outlined above under significant events   Objective   Blood pressure (!) 161/96, pulse 83, temperature 98.1 F (36.7 C), temperature source Oral, resp. rate 19, height $RemoveBe'5\' 9"'nLquOiJfM$  (1.753 m), weight (!) 204 kg, SpO2 95 %.    FiO2 (%):  [40 %] 40 %   Intake/Output Summary (Last 24 hours) at 03/16/2022 2683 Last data filed at 03/16/2022 0700 Gross per 24 hour  Intake 1475.5 ml  Output 2800 ml  Net -1324.5 ml    Filed Weights    Examination: GENERAL: Acute on chronically ill  appearing male, awake and alert, on TC, in NAD HENT: Atraumatic, normocephalic, neck supple, 8 XLT Tracheostomy dressing clean, dry and intact LUNGS: clear throughout, no wheezing or rales, even, non labored  CV: NSR, RRR, s1s2, no M/R/G, 2+ generalized edema  ABDOMEN: Obese, soft, nontender, no guarding or rebound tenderness, BS+ x4 MUSCULOSKELETAL: Generalized weakness, moves all extremities  NEURO:  Alert, following/MAE to commands, PERRL  Resolved Hospital Problem list   Community-acquired pneumonia: Streptococcus pneumoniae and Haemophilus influenzae ~ TREATED MRSA HCAP ~ TREATED Provetella Sinusitis ~ TREATED  Assessment & Plan:  Acute on chronic hypoxemic and hypercapneic respiratory failure now s/p tracheostomy PMHx: OSA/OHS - Full vent support, implement lung protective strategies - Plateau pressures less than 30 cm H20 - Wean FiO2 & PEEP as tolerated to maintain O2 sats >92% - Follow intermittent Chest X-ray & ABG as needed - SBT vs TCT as tolerated  - Implement VAP Bundle - Prn Bronchodilators  MRSA HCAP- completed linezolid x 7 days Provetella sinusitis- completed unasyn/augmentin Persistent low grade fever, suspect due to RUE DVT ~ RESOLVED - Trend WBC and monitor fever curve  - ID consulted, has signed off  Acute renal failure with hyperkalemia now on iHD Hypernatremia - Trend BMP - Replace electrolytes as indicated - Monitor UOP: improving  - Nephrology consulted appreciate input: HD per recommendations ~ currently no HD indicated (temporary HD catheter has been removed) -Now that have approval for use of G tube, will resume free water flushes  DM2 with hyperglycemia - CBG's q4h; Target range of 140 to 180 - SSI & Semglee - Follow ICU Hypo/Hyperglycemia protocol  Ongoing severe toxic encephalopathy- RESOLVED Possible  Critical Illness Myopathy/polyneuropathy: improving  MRI brain/C spine okay 8/15.  Still uremic.  EEG 8/16 neg.  - Avoid sedating medications  as able - PT/OT following - Mobilize as able  PICC associated DVT- PICC removed - Heparin gtt - Plan to transition to Eliquis 8/27 now that G-tube approved for use  Best Practice (right click and "Reselect all SmartList Selections" daily)   Diet/type: NPO, tube feeds  DVT prophylaxis: Eliquis. GI prophylaxis: PPI Lines: Discontinued HD catheter 03/12/22 Foley: N/A Code Status:  full code Last date of multidisciplinary goals of care discussion [03/14/22]  Long term prognosis guarded  Critical Care Time: 30 minutes  Darel Hong, AGACNP-BC [facility] Prefer epic messenger for cross cover needs If after hours, please call E-link

## 2022-03-16 NOTE — Progress Notes (Signed)
Hunt for IV Heparin  Indication: RUE DVT associated with PICC  Patient Measurements: Height: 5\' 9"  (175.3 cm) Weight:  (scale not working) IBW/kg (Calculated) : 70.7 Heparin Dosing Weight: 123.4 kg   Labs: Recent Labs    03/14/22 0551 03/15/22 0556 03/15/22 2105 03/16/22 0309  HGB 8.6* 9.1*  --  8.2*  HCT 29.7* 32.0*  --  28.7*  PLT 208 249  --  228  HEPARINUNFRC 0.52  --  0.38 0.48  CREATININE 2.29* 2.02*  --  1.61*   Estimated Creatinine Clearance: 102.7 mL/min (A) (by C-G formula based on SCr of 1.61 mg/dL (H)).  Medical History: Past Medical History:  Diagnosis Date   Diabetes mellitus without complication ([place])    Hypertension    Pancreatitis    Assessment: Patient is a 46 y/o M with medical history including DM, HTN, pancreatitis, tobacco use disorder, systolic CHF, OSA, DM c/b diabetic neuropathy, lumbar radiculopathy, morbid obesity who is admitted with acute respiratory failure in setting of acute CHF, hypertensive urgency, and pneumonia. Patient with prolonged admission on ventilator complicated by acute renal failure.  Pharmacy consulted to initiate and manage IV heparin for RUE DVT.  Heparin held for G tube placement 8/25. Cleared with surgery to re-start anticoagulation 8/26  Goal of Therapy:  Heparin level 0.3-0.7 units/ml Monitor platelets by anticoagulation protocol: Yes   Plan: HL therapeutic x 2 --Continue heparin at 2900 units/hr --HL daily w/ AM labs while therapeutic --Daily CBC per protocol while on IV heparin  Renda Rolls, PharmD, [facility] 03/16/2022 5:27 AM

## 2022-03-16 NOTE — Consult Note (Addendum)
PHARMACY CONSULT NOTE  Pharmacy Consult for Electrolyte Monitoring and Replacement   Recent Labs: Potassium (mmol/L)  Date Value  03/16/2022 4.1   Magnesium (mg/dL)  Date Value  03/16/2022 1.8   Calcium (mg/dL)  Date Value  03/16/2022 8.0 (L)   Albumin (g/dL)  Date Value  03/16/2022 2.4 (L)   Phosphorus (mg/dL)  Date Value  03/16/2022 4.3   Sodium (mmol/L)  Date Value  03/16/2022 150 (H)   Assessment: Patient is a 46 y/o M with medical history including DM, HTN, pancreatitis, tobacco use disorder, systolic CHF, OSA, DM c/b diabetic neuropathy, lumbar radiculopathy, morbid obesity who is admitted with acute respiratory failure in setting of acute CHF and hypertensive urgency. Pharmacy consulted to assist with electrolyte monitoring and replacement as indicated.  Admission complicated by renal insufficiency requiring renal replacement therapy at one point which has now been discontinued with renal recovery.  Goal of Therapy:  Electrolytes within normal limits  Plan:  --Na 150, secondary to free water deficit. Pending ability to use PEG tube. D5w at 50 cc/hr --Mg 1.8, magnesium sulfate 2 g IV x 1 --Hyperphosphatemia resolved. Renvela has been on hold as patient has not had enteral access. Suspect this can be discontinued soon if renal recovery continues. Will defer decision to nephrology --Follow-up electrolytes with AM labs tomorrow  Benita Gutter  03/16/2022 7:14 AM

## 2022-03-16 NOTE — Progress Notes (Signed)
[facility]  PROGRESS NOTE   Subjective:   Patient seen and evaluated at bedside in ICU Remains critically ill. Alert and able to mouth words.   Objective:  Vital signs: Blood pressure (!) 161/96, pulse 83, temperature 98.1 F (36.7 C), temperature source Oral, resp. rate 19, height 5\' 9"  (1.753 m), weight (!) 204 kg, SpO2 95 %.  Intake/Output Summary (Last 24 hours) at 03/16/2022 4742 Last data filed at 03/16/2022 0700 Gross per 24 hour  Intake 1475.5 ml  Output 2800 ml  Net -1324.5 ml   Filed Weights     Physical Exam: General:  No acute distress, resting comfortably  Head:  Normocephalic, atraumatic. Moist oral mucosal membranes  Eyes:  Anicteric  Lungs:    tracheostomy with 10L [place]  Heart:  Regular on monitor  Abdomen:   Soft, nontender,obese  Extremities: trace peripheral edema.  Neurologic: Alert.  Able to follow simple commands.  Skin:  No lesions  Access: Right IJ temp cath placed on 5/95/63    Basic Metabolic Panel: Recent Labs  Lab 03/12/22 0709 03/13/22 0339 03/13/22 0348 03/14/22 0551 03/15/22 0556 03/16/22 0309  NA 140  --  143 145 149* 150*  K 4.3  --  4.4 4.1 4.4 4.1  CL 104  --  106 107 111 113*  CO2 29  --  27 29 28 29   GLUCOSE 132*  --  93 98 93 105*  BUN 65*  --  63* 59* 54* 47*  CREATININE 2.56*  --  2.65* 2.29* 2.02* 1.61*  CALCIUM 8.1*  --  8.3* 8.5* 8.4* 8.0*  MG 2.2 2.2  --  2.0 2.0 1.8  PHOS 6.0*  6.1* 6.3* 6.4* 6.1*  6.2* 6.0*  6.0* 4.3    CBC: Recent Labs  Lab 03/12/22 0709 03/13/22 0339 03/14/22 0551 03/15/22 0556 03/16/22 0309  WBC 7.0 6.7 6.6 6.3 6.7  NEUTROABS 5.0 4.4 4.7 4.6 4.6  HGB 8.0* 8.1* 8.6* 9.1* 8.2*  HCT 27.4* 28.0* 29.7* 32.0* 28.7*  MCV 90.1 92.7 91.4 95.0 94.7  PLT 198 200 208 249 228     Urinalysis: No results for input(s): "COLORURINE", "LABSPEC", "PHURINE", "GLUCOSEU", "HGBUR", "BILIRUBINUR", "KETONESUR", "PROTEINUR", "UROBILINOGEN", "NITRITE", "LEUKOCYTESUR" in the last 72  hours.  Invalid input(s): "APPERANCEUR"     Imaging: No results found.   Medications:    sodium chloride 10 mL/hr at 03/14/22 1036   dextrose 50 mL/hr at 03/16/22 0700   magnesium sulfate bolus IVPB      apixaban  5 mg Per Tube BID   Chlorhexidine Gluconate Cloth  6 each Topical Q0600   insulin aspart  0-20 Units Subcutaneous Q4H   insulin aspart  4 Units Subcutaneous Q4H   insulin detemir  22 Units Subcutaneous BID   ipratropium-albuterol  3 mL Nebulization Q6H   metoprolol tartrate  5 mg Intravenous Q6H   multivitamin  1 tablet Per Tube QHS   mupirocin ointment   Nasal BID   mouth rinse  15 mL Mouth Rinse Q2H   sevelamer carbonate  2.4 g Per Tube TID WC    Assessment/ Plan:     46 y.o. male with medical problems of poorly controlled diabetes, hypertension, systolic CHF, obstructive sleep apnea, obesity    admitted on 02/11/2022 for  Principal Problem:   Acute respiratory failure with hypoxia and hypercarbia (HCC) Active Problems:   Acute on chronic systolic CHF (congestive heart failure) ([place])   Hypertensive urgency   Type 2 diabetes mellitus with peripheral neuropathy (  [place])   Acute heart failure with preserved ejection fraction (HCC)   Renal failure (ARF), acute on chronic (HCC)   Hospital-acquired pneumonia   ARDS (adult respiratory distress syndrome) (HCC)   Pressure injury of skin    # Acute kidney injury: Acute kidney injury is most likely secondary to acute tubular necrosis possibly due to hypotension, fever and sepsis.   Patient became oligoanuric with metabolic acidosis and hyperkalemia.   Patient peak creatinine was 6.0, patient creatinine is now at 1.6  Patient 24-hour urine creatinine clearance done on March 12, 2022 had shown a clearance of 40 mL/min No need for renal placement therapy at this time Patient has adequate urine output  # Hyperphosphatemia -Phosphorus 6.1, continue sevelamer  # Hyperkalemia:  -Potassium 4.1, now better  monitoring.  #Chronic systolic CHF with volume overload 2D echo February 16, 2022-LVEF 40 to 45%, mild concentric LVH moderately enlarged right ventricle, moderately dilated left atrium and right atrium.   #4  Acute hypoxic Vent dependent respiratory failure:  Now with tracheostomy on 10L trach collar   #5 Sepsis: Management as per ICU and ID teams. - completed linezolid and Augmentin.      LOS: [facility] 8/27/20239:24 AM

## 2022-03-17 DIAGNOSIS — J9601 Acute respiratory failure with hypoxia: Secondary | ICD-10-CM | POA: Diagnosis not present

## 2022-03-17 DIAGNOSIS — J9602 Acute respiratory failure with hypercapnia: Secondary | ICD-10-CM | POA: Diagnosis not present

## 2022-03-17 LAB — GLUCOSE, CAPILLARY
Glucose-Capillary: 107 mg/dL — ABNORMAL HIGH (ref 70–99)
Glucose-Capillary: 132 mg/dL — ABNORMAL HIGH (ref 70–99)
Glucose-Capillary: 122 mg/dL — ABNORMAL HIGH (ref 70–99)
Glucose-Capillary: 140 mg/dL — ABNORMAL HIGH (ref 70–99)
Glucose-Capillary: 157 mg/dL — ABNORMAL HIGH (ref 70–99)
Glucose-Capillary: 126 mg/dL — ABNORMAL HIGH (ref 70–99)
Glucose-Capillary: 115 mg/dL — ABNORMAL HIGH (ref 70–99)

## 2022-03-17 LAB — CBC WITH DIFFERENTIAL/PLATELET
Abs Immature Granulocytes: 0.02 10*3/uL (ref 0.00–0.07)
Basophils Absolute: 0 10*3/uL (ref 0.0–0.1)
Basophils Relative: 0 %
Eosinophils Absolute: 0.4 10*3/uL (ref 0.0–0.5)
Eosinophils Relative: 6 %
HCT: 31.8 % — ABNORMAL LOW (ref 39.0–52.0)
Hemoglobin: 9.1 g/dL — ABNORMAL LOW (ref 13.0–17.0)
Immature Granulocytes: 0 %
Lymphocytes Relative: 14 %
Lymphs Abs: 0.9 10*3/uL (ref 0.7–4.0)
MCH: 27.2 pg (ref 26.0–34.0)
MCHC: 28.6 g/dL — ABNORMAL LOW (ref 30.0–36.0)
MCV: 95.2 fL (ref 80.0–100.0)
Monocytes Absolute: 0.7 10*3/uL (ref 0.1–1.0)
Monocytes Relative: 11 %
Neutro Abs: 4.5 10*3/uL (ref 1.7–7.7)
Neutrophils Relative %: 69 %
Platelets: 274 10*3/uL (ref 150–400)
RBC: 3.34 MIL/uL — ABNORMAL LOW (ref 4.22–5.81)
RDW: 16.8 % — ABNORMAL HIGH (ref 11.5–15.5)
WBC: 6.5 10*3/uL (ref 4.0–10.5)
nRBC: 0 % (ref 0.0–0.2)

## 2022-03-17 LAB — RENAL FUNCTION PANEL
Albumin: 2.6 g/dL — ABNORMAL LOW (ref 3.5–5.0)
Anion gap: 9 (ref 5–15)
BUN: 41 mg/dL — ABNORMAL HIGH (ref 6–20)
CO2: 29 mmol/L (ref 22–32)
Calcium: 8.4 mg/dL — ABNORMAL LOW (ref 8.9–10.3)
Chloride: 112 mmol/L — ABNORMAL HIGH (ref 98–111)
Creatinine, Ser: 1.24 mg/dL (ref 0.61–1.24)
GFR, Estimated: >60 mL/min (ref >60)
Glucose, Bld: 140 mg/dL — ABNORMAL HIGH (ref 70–99)
Phosphorus: 3.6 mg/dL (ref 2.5–4.6)
Potassium: 3.9 mmol/L (ref 3.5–5.1)
Sodium: 150 mmol/L — ABNORMAL HIGH (ref 135–145)

## 2022-03-17 LAB — MAGNESIUM: Magnesium: 1.8 mg/dL (ref 1.7–2.4)

## 2022-03-17 MED ORDER — MAGNESIUM SULFATE 2 GM/50ML IV SOLN
2.0000 g | Freq: Once | INTRAVENOUS | Status: AC
  Administered 2022-03-17: 2 g via INTRAVENOUS
  Filled 2022-03-17: qty 50

## 2022-03-17 MED ORDER — CARVEDILOL 3.125 MG PO TABS
3.1250 mg | ORAL_TABLET | Freq: Two times a day (BID) | ORAL | Status: DC
Start: 2022-03-17 — End: 2022-03-18
  Administered 2022-03-17 – 2022-03-18 (×2): 3.125 mg
  Filled 2022-03-17 (×2): qty 1

## 2022-03-17 MED ORDER — ESCITALOPRAM OXALATE 10 MG PO TABS
10.0000 mg | ORAL_TABLET | Freq: Every day | ORAL | Status: DC
Start: 2022-03-17 — End: 2022-03-25
  Administered 2022-03-17 – 2022-03-24 (×8): 10 mg
  Filled 2022-03-17 (×9): qty 1

## 2022-03-17 MED ORDER — FREE WATER
150.0000 mL | Status: DC
  Administered 2022-03-17 – 2022-03-18 (×19): 150 mL

## 2022-03-17 MED ORDER — PANTOPRAZOLE 2 MG/ML SUSPENSION
40.0000 mg | Freq: Every day | ORAL | Status: DC
  Administered 2022-03-17 – 2022-03-24 (×8): 40 mg
  Filled 2022-03-17 (×9): qty 20

## 2022-03-17 NOTE — Progress Notes (Signed)
NAME:  Samuel Chavez, MRN:  [id number], DOB:  1975-12-21, LOS: 58 ADMISSION DATE:  02/11/2022  History of Present Illness:  46 y.o male with significant PMH as below who presented to the ED with chief complaints of SOB and worsening bilateral lower extremities edema.   ED Course: In the emergency department, the temperature was 37.3C, the heart rate 99 beats/minute, the blood pressure 187/102 mm Hg, the respiratory rate 20 breaths/minute, and the oxygen saturation 89% on. He was noted to be drowsy but still protecting his airway and responding appropriately.   Pertinent  Medical History   Past Medical History:  Diagnosis Date   Diabetes mellitus without complication ([place])    Hypertension    Pancreatitis     Significant Hospital Events: Including procedures, antibiotic start and stop dates in addition to other pertinent events   7/25: Admitted to hospitalist service w/acute on chronic hypoxic hypercapnic resp. failure requiring BiPAP.Failed BiPAP and intubated. PCCM consulted 7/26: INTUBATED, difficlut to sedate, started [place] 7/27: severe hypoxia, PEEP increased to 15 but decreased back to 10 7/28: Persistent severe hypoxia, ventilator changes made 7/29: Persistent hypoxia, recruitment maneuvers instituted, ventilator changes made, empiric heparin as patient cannot be scanned query PE -02/17/22- patient is severely critically ill with bilateral multifocal pneumonia on sedation and paralysis with mechanical ventilation maximal settings.  He is at cusp of death, we met with daughter today and discussed severity of illness. Unable to perform CT due to severity of critical illness. Met with previous PCCM doc and discussed medical plan.  Patient had recruitment maneuvers last few days without improvement, on heparin gtt for possible PE.  02/18/22- patient continues to require maximal settings on ventilator despite good UOP.  He destaturated to <50% spO2 on MV today required BGV.    02/19/22- patient continues to require 100% FiO2 on maximal setting with high PEEP ladder for possible ARDS.  He was diuresed and on steroids with development of AKI.  His CXR had slight interval improvement on right. We discussed case with vascular surgery regarding possible empiric tPA for PE.  02/20/22- patient was unable to get CT today due to continued severe hypoxemia.  Daughter and other family members at bedside we reviewed case and medical plan.  There seems to be some confusion from family as they had asked me to wake patient up and take off ventilator even though I had explained multiple times that he is at very high risk for death.  They laughed at my comments and seemed to think it was not true. We may still be able to do bronchoscopy but its high risk.  We have reduced IV infusions by switching some medications to OGT route.  02/21/22- patient is weaned to 60%FiO2.  Plan for possible bronch if patient is tolerating.  Family at bedside.  If able to we will obtain more imaging and VQ scan to rule out PE.  02/22/22- Patient weaned to 50% on PRVC.  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 CBC stable , CMP with improved GFR. Monitoring Na, pharmacy and renal following for electrolytes/renal function.  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  Overall marked improvement on ventilator over past 48h. Met with daughter at bedside reviewed medical plan.  02/24/22-Vent requirements continue to slowly improve, currently 40% FiO2 and 14 PEEP.  AKI continues to slowly improve, remains hyperkalemic with potassium of 5.8.  Placed back on  Veltassa, Diurese x1.  Trach scheduled for tomorrow at 1:15 PM 02/25/22- Diurese with Lasix x1 dose. Trach scheduled for today. 02/26/22: Remains mechanically ventilated via newly placed tracheostomy will attempt to wean ventilator settings today.  Perform WUA  02/26/22: Pt remains mechanically  ventilated via tracheostomy vent settings: PEEP 14/FiO2 80% 02/27/22: Fevers, ID consulted. Repeating Tracheal aspirate and UA.  Zosyn started 02/28/22: Persistent fevers, Tracheal aspirate with Staph aureus, start Linezolid.  Worsening Creatinine, considering Lasix gtt given high vent requirements (100% FiO2, 14 peep).  D/c fentanyl gtt and decrease oral benzos/narcotics 03/01/22: worsening anuric renal failure. HD line placed and started on hemodialysis. 03/02/22: Vent support slowly improving. Plan for HD again today. 8/14-8/15: remains febrile, encephalopathic despite HD sessions 8/16 fever curve improved, clots noted in LUE 8/18: continued low grade fever (t max 100.6).  Receiving HD.  Slight improvement in encephalopathy 8/19 off pressors, off sedation, remains on vent  8/20: Afebrile, tolerating PSV 8/21: Remains afebrile, obtain CT Abdomen for IR to evaluate for PEG placement.  Tolerating PSV (15/5).  Consult PT for passive ROM 8/22: Extremely weak, but able to MAE to command, nodding to questions. Tolerating PSV, will trial TC. Deemed not candidate for IR placement of PEG, will have General Surgery evaluate 8/23: No acute events overnight tolerating SBT 10/5 will attempt to transition to TCT today  8/24: No acute events overnight continues to tolerate SBT, will attempt TCT as tolerated  8/25: Tolerating TC during day and vent at night.  Strength improving.  Robotic assisted laparoscopic gastrostomy tube placement by General Surgery, heparin currently on hold for procedure. 8/26: tolerating vent at night, TC during day.  Awaiting Surgery approval to restart Heparin gtt today. Na increased to 149 as unable to use G-tube till tomorrow for free water flushes.  Will start low dose D5W infusion 8/27: Tolerated TC all day and throughout night yesterday, General Surgery approves use of G-tube for meds and feeds.  Transition Heparin gtt to Eliquis.  Resume feeds and free water flushes 8/28: Pt placed  back on ventilator overnight due to hypoxia vent settings SBT 10/10.  Will attempt TCT today as tolerated   Consults:  PCCM Nephrology Vascular Surgery Palliative Care ENT Infectious Disease General Surgery  Procedures:  7/25: Intubation 7/26: PICC triple-lumen, right basilic vein 4/65: Left radial arterial line  8/08: Size 8 proximal XLT Shiley inserted 8/12: Right IJ HD catheter placed 8/25: Robotic assisted laparoscopic gastrostomy tube placement by General Surgery  Significant Diagnostic Tests:  7/25  Chest Xray:Chest x-ray showed cardiomegaly and pulmonary vascular congestion without overt pulmonary edema 7/25 Echocardiogram: difficult study, LVEF was estimated at 55 to 60%, dilated LV moderate LVH, grade 1 DD, enlargement of the right ventricle, left atrial size moderately dilated, right atrial size moderately dilated this is consistent with restrictive physiology (echo reading not congruous with prior echoes from [facility]) 7/29: Venous US BLE>>IMPRESSION: No lower extremity DVT. 7/30 Limited echo: LVEF 40 to 45% decreased LV function, no wall motion abnormalities, moderate dilation of the LV concentric LVH, right ventricular size moderately enlarged, right atrial enlargement, this is more consistent with prior [facility] scans 8/4: CT Head>>No acute intracranial abnormality. Paranasal sinusitis. Bilateral complete opacification of the mastoid air cells and inner ears, as can be seen in the setting of otitis media. Correlate with symptoms. 8/4: CT Chest/Abdomen/Pelvis>>Extensive ground-glass, alveolar and patchy dense infiltrates in both lungs suggesting multifocal pneumonia. Part of this finding may suggest underlying pulmonary edema. Small bilateral pleural effusions are seen.  Cardiomegaly. There is ectasia of the main pulmonary artery suggesting pulmonary arterial hypertension. There is no evidence of intestinal obstruction or pneumoperitoneum. There is no  hydronephrosis. Appendix is not dilated. UB diverticula are seen in the colon without signs of focal diverticulitis. Severe degenerative changes are noted at L4-L5 level with significant interval worsening. Findings may be due to severe disc degeneration. If there is clinical suspicion for discitis or osteomyelitis, follow-up MRI may be considered 8/4: Lung V/Q>>Pulmonary embolism absent. 8/15: Venous US BLE>>No evidence of deep venous thrombosis in either lower extremity 8/16: Venous US BLE>>1. Non-occlusive/nearly occlusive thrombus in one of paired right upper arm brachial veins containing the indwelling PICC line and occlusive thrombus in the right axillary vein. The visualized segment of the right subclavian vein is patent. 2. No evidence of left upper extremity DVT.  Micro Data:  7/25: SARS-CoV-2 PCR> negative 7/25: MRSA PCR>> NEG 7/27: Strep pneumoniae Ag>> negative 7/27: Legionella Ag>> negative 7/27: Mycoplasma>> <770 7/27: Sputum>> Strep pneumo,Haemophilus influenzae 7/30 Sputum cult >> negative 8/4: Tracheal aspirate>>negative 8/9: Tracheal aspirate>>MRSA 8/10: RVP>>negative 8/10: Right nasal Wound culture>>STAPHYLOCOCCUS AUREUS, PROVOTELLA 8/10: MRSA PCR>> Positive 8/11: Blood culture 1/4>>Staphylococcus epidermidis MecA/C (suspect contaminant)  Antimicrobials:  Cefepime 7/27 >>7/31 Ceftriaxone 7/31>>8/4 Vancomycin 7/27 >> 7/29, restarted 7/30 (resumed due to fever spike on Maxipime) ? Resistant Strep pneumo >> 7/31 Unasyn 8/14>>8/17 Augmentin 8/17>>8/21 Linezolid 8/11>>8/20  Interim History / Subjective:  As outlined above under significant events   Objective   Blood pressure (!) 161/98, pulse 74, temperature 99.4 F (37.4 C), temperature source Axillary, resp. rate 11, height 5' 9" (1.753 m), weight (!) 204 kg, SpO2 100 %.    Vent Mode: PSV FiO2 (%):  [28 %-40 %] 30 % PEEP:  [10 cmH20] 10 cmH20 Pressure Support:  [10 cmH20] 10 cmH20   Intake/Output Summary  (Last 24 hours) at 03/17/2022 1700 Last data filed at 03/17/2022 0700 Gross per 24 hour  Intake 1097.28 ml  Output 2551 ml  Net -1453.72 ml   Filed Weights    Examination: GENERAL: Acute on chronically ill appearing male, awake and alert, on mechanical ventilator via tracheostomy NAD HENT: Atraumatic, normocephalic, neck supple, 8 XLT Tracheostomy dressing clean, dry and intact LUNGS: Clear throughout, no wheezing or rales, even, non labored  CV: NSR, RRR, s1s2, no M/R/G, 2+ generalized edema  ABDOMEN: Obese, soft, nontender, no guarding or rebound tenderness, BS+ x4; PEG tube intact  MUSCULOSKELETAL: Generalized weakness, moves all extremities  NEURO:  Alert, following/MAE to commands, PERRL  Resolved Hospital Problem list   Community-acquired pneumonia: Streptococcus pneumoniae and Haemophilus influenzae ~ TREATED MRSA HCAP ~ TREATED Provetella Sinusitis ~ TREATED  Assessment & Plan:  Acute on chronic hypoxemic and hypercapneic respiratory failure now s/p tracheostomy PMHx: OSA/OHS - Full vent support, implement lung protective strategies - Plateau pressures less than 30 cm H20 - Wean FiO2 & PEEP as tolerated to maintain O2 sats >92% - Follow intermittent Chest X-ray & ABG as needed - SBT vs TCT as tolerated  - Implement VAP Bundle - Prn Bronchodilators  MRSA HCAP- completed linezolid x 7 days Provetella sinusitis- completed unasyn/augmentin Persistent low grade fever, suspect due to RUE DVT ~ RESOLVED - Trend WBC and monitor fever curve  - ID consulted, has signed off  Acute renal failure  Hypernatremia - Trend BMP - Replace electrolytes as indicated - Monitor UOP - Nephrology consulted appreciate input: HD per recommendations ~ currently no HD indicated (temporary HD catheter has been removed) - Increase free water flushes  to 150 q2hrs   DM2 with hyperglycemia - CBG's q4h; Target range of 140 to 180 - SSI & Semglee - Follow ICU Hypo/Hyperglycemia  protocol  Ongoing severe toxic encephalopathy- RESOLVED 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  Still uremic.  EEG 8/16 neg.  - Avoid sedating medications as able - PT/OT following - Mobilize as able  PICC associated DVT- PICC removed - Continue eliquis   Depression  - Will start lexapro  Best Practice (right click and "Reselect all SmartList Selections" daily)   Diet/type: NPO, tube feeds  DVT prophylaxis: Eliquis. GI prophylaxis: PPI Lines: Discontinued HD catheter 03/12/22 Foley: N/A Code Status:  full code Last date of multidisciplinary goals of care discussion [03/17/22]  Long term prognosis guarded  Updated pts daughter Letta Kocher via telephone regarding pts condition and current plan of care  Critical Care Time: 30 minutes   Donell Beers, [place] Pager [phone] (please enter 7 digits) PCCM Consult Pager [phone] (please enter 7 digits)

## 2022-03-17 NOTE — Evaluation (Addendum)
[facility] - Evaluation Patient Details  Name: Samuel Chavez MRN: [id number] Date of Birth: Apr 04, 1976  Today's Date: 03/17/2022 Time: 1300-1400 SLP Time Calculation (min) (ACUTE ONLY): 60 min  Past Medical History:  Past Medical History:  Diagnosis Date   Diabetes mellitus without complication ([place])    Hypertension    Pancreatitis    Past Surgical History:  Past Surgical History:  Procedure Laterality Date   CHOLECYSTECTOMY     TRACHEOSTOMY TUBE PLACEMENT N/A 02/25/2022   Procedure: TRACHEOSTOMY;  Surgeon: Samuel Canterbury, MD;  Location: [facility] ORS;  Service: ENT;  Laterality: N/A;   HPI:  Pt is 89 YOM admitted for PNA, renail failure, AKI, hyperkalemia, CHF, Resp failure, sepsis, and is s/p trach placement on 02/25/22 -- #8 shiley XLT.   PMH includes: Morbid Obesity, ARDS, type 2 diabetes mellitus, hypertension, pancreatitis, ongoing tobacco abuse, systolic CHF, OSA, type 2 diabetes mellitus and diabetic neuropathy, lumbar radiculopathy.  Admitting DXs: Acute respiratory failure with hypoxia and hypercarbia. Prior to hospital admission, pt was Independent for mobility and ADLs. Pt lives with family, communication limited to yes/no with head nods 2/2 tracheostomy.   CXR on 8/16: mild pulmonary edema.    Assessment / Plan / Recommendation  Clinical Impression   Pt seen for PMV evaluation today -- PMV wear/use for verbal communication w/ others. Pt continues on TC wean today off vent support during daytime (since this morning at ~9AM; 2 days concurrent now) w/ nocturnal vent, per RT. Pt was alert, mouthing and nodding head during session; resting in bed w/ sons(minor children) present.    Explained the use and wear of the PMV to pt; trach and stoma area inspected; HR 80s, O2 sats upper 90s; RR 20. FiO2 40% at 8-10L per chart. Ensured Cuff was deflated prior to finger occlusion assessment. Pt was able to attain phonation adequately w/out change in ANS. PMV then placed. Pt's RR:  19-22, O2 sats 98-99%, HR 80s throughout wear/assessment of PMV.  Pt verbalized indicating his goal was to "sit in a chair today" -- will pass along to PT. Verbalizations were c/b low volume and decreased breath support limiting speech/responses to mainly the Word-level. Intelligibility adequate at the Word-level but decreased at the Phrase-level d/t reduced breath support/effort. Vocal quality was gravely, min strained. (Will focus on breath support strats/exs next session)  Encouraged pt to use his breath support/effort to increase volume which he was able to do during brief verbal engagements w/ family. PMV placement was tolerated during session for ~15+ mins w/ no change in ANS. No overt discomfort noted in his respiratory effort -- pt stated it felt "fine" to wear/talk w/ PMV in place. No increased effort noted in respirations during the expiratory phase; no overt use of accessary muscles or distress was noted in his breathing pattern.  PMV was removed after evaluation to allow pt to rest; PT and OT therapies to follow. Discussed w/ pt that increasing Stamina for PMV wear/use is best done Slowly. Pt nodded in agreement. Education provided to his sons in room; to pt and NSG.   Pt appears to adequately tolerate PMV placement w/out overt respiratory discomfort or distress; ANS remained adequately stable at his Baseline during wear/use. Suspect the Shiley trach size, #8, SLT is adequate for comfortable PMV wear/use.  Much education was given on PMV use/wear, MUST have Cuff deflation for PMV wear, checking and removing the air from the balloon b/f placing, placing/removing the PMV, and care of the PMV. Discussed that PMV  can be worn in increments but must NOT be worn when sleeping, drowsy. Encouraged Rest Breaks at times during the day. Precautions posted at bedside and in chart.  Pt remains w/ a Cuffed trach during his wean at this time. Sticker placed on Cuff line, and in room. NSG made aware. MD updated. ST  services will continue to follow for ongoing tx; see POC, goals. SLP Visit Diagnosis: Aphonia (R49.1)    SLP Assessment  Patient needs continued Speech [place] Pathology Services    Recommendations for follow up therapy are one component of a multi-disciplinary discharge planning process, led by the attending physician.  Recommendations may be updated based on patient status, additional functional criteria and insurance authorization.  Follow Up Recommendations  Acute inpatient rehab (3hours/day) (TBD vs LTACH)    Assistance Recommended at Discharge Frequent or constant Supervision/Assistance  Functional Status Assessment Patient has had a recent decline in their functional status and demonstrates the ability to make significant improvements in function in a reasonable and predictable amount of time.  Frequency and Duration min 2x/week  2 weeks    PMSV Trial PMSV was placed for: 15 mins Able to redirect subglottic air through upper airway: Yes Able to Attain Phonation: Yes Voice Quality: Low vocal intensity (jgravel; dysphonic) Able to Expectorate Secretions: No attempts Level of Secretion Expectoration with PMSV: Not observed Breath Support for Phonation: Mildly decreased (-Moderately decreased) Intelligibility: Intelligibility reduced Word: 75-100% accurate Phrase: 25-49% accurate Respirations During Trial: 20 SpO2 During Trial: 99 % Pulse During Trial: 86 Behavior: Alert;Cooperative;Expresses self well;Good eye contact;Responsive to questions   Tracheostomy Tube  Additional Tracheostomy Tube Assessment Fenestrated: No Trach Collar Period: RT placed on TC this morning at 9:00 Secretion Description: min-mod Frequency of Tracheal Suctioning: PRN Level of Secretion Expectoration: Tracheal    Vent Dependency  Vent Dependent: No (nocturnal) FiO2 (%): 40 %    Cuff Deflation Trial Tolerated Cuff Deflation: Yes Length of Time for Cuff Deflation Trial: since TC transition  this AM Behavior: Alert;Cooperative;Expresses self well;Good eye contact;Responsive to questions;Smiling Cuff Deflation Trial - Comments: n/a           Samuel Chavez 03/17/2022, 4:00 PM Orinda Kenner, MS, CCC-SLP Speech Language Pathologist Rehab Services; [place] [phone] (ascom)

## 2022-03-17 NOTE — TOC Progression Note (Signed)
Transition of Care [facility]) - Progression Note    Patient Details  Name: Samuel Chavez MRN: [id number] Date of Birth: [birth year]  [facility] of [facility]) CM/SW Contact  Shelbie Hutching, RN Phone Number: 03/17/2022, 11:25 AM  Clinical Narrative:    Provided Jenn with [facility] update on patient progress.  Patient is no longer requiring dialysis and he is participating in therapy.  PT and OT will be coming back this afternoon and continue working with patient, he is making good progress.  Danise Mina will pass this information over to [facility] in [place].     Expected Discharge Plan:  (TBD) Barriers to Discharge: Continued Medical Work up  Expected Discharge Plan and Services Expected Discharge Plan:  (TBD)   Discharge Planning Services: CM Consult   Living arrangements for the past 2 months: Single Family Home                                       Social Determinants of Health (SDOH) Interventions    Readmission Risk Interventions     No data to display

## 2022-03-17 NOTE — Progress Notes (Addendum)
Nutrition Follow Up Note   DOCUMENTATION CODES:   Morbid obesity  INTERVENTION:   Continue Vital 1.5 @70ml/hr + ProSource TF20- 60ml TID via tube  Free water flushes 150ml q2 hours per MD  Regimen provides 2760kcal/day, 173g/day protein and 1284ml/day of free water (3084ml + free water)  Rena-vit daily via tube   Juven Fruit Punch BID, each serving provides 95kcal and 2.5g of protein (amino acids glutamine and arginine)  NUTRITION DIAGNOSIS:   Inadequate oral intake related to inability to eat (pt sedated and ventilated) as evidenced by NPO status.  GOAL:   Provide needs based on ASPEN/SCCM guidelines -met   MONITOR:   Vent status, Labs, Weight trends, TF tolerance, Skin, I & O's  ASSESSMENT:   46 y/o male with h/o COPD, CHF, HTN, DM and OSA who is admitted with CHF exacerbation, CAP and severe ARDS.  -Pt s/p tracheostomy and NGT placement 8/8 -Pt s/p new HD 8/12 -Pt s/p surgically placed 16 French gastric tube 8/25  Pt currently on TC but required vent overnight. G-tube in place and pt is tolerating tube feeds at 60ml/hr (goal 70ml/hr). Pt is refeeding; electrolytes being monitored. Pt with hypernatremia; RD suspects this is related to tube feeds being held for 6 days while pending G-tube placement. Per chart, pt is up ~73lbs from his UBW. Pt -13.1L on his I & Os. SLP evaluation is pending. Pt no longer requiring dialysis. Plan is for possible LTACH.   Medications reviewed and include: insulin, rena-vit, juven, protonix, renvela   Labs reviewed: Na 150(H), K 3.9 wnl, BUN 41(H), P 3.6 wnl, Mg 1.8 wnl Hgb 9.1(L), Hct 31.8(L) Cbgs- 122, 132, 107 x 24 hrs  MAP- >65mmHg   UOP- 3350ml   Diet Order:   Diet Order             Diet NPO time specified  Diet effective midnight                  EDUCATION NEEDS:   Not appropriate for education at this time  Skin:  Skin Assessment: Reviewed RN Assessment (Pressure Injury: right ischium)  Last BM:  8/27- type  6  Height:   Ht Readings from Last 1 Encounters:  02/28/22 5' 9" (1.753 m)    Weight:   Wt Readings from Last 1 Encounters:  03/12/18 (!) 154.2 kg    Ideal Body Weight:  72.7 kg  BMI:  Body mass index is 66.41 kg/m.  Estimated Nutritional Needs:   Kcal:  2700-3000kcal/day  Protein:  182g/day protein  Fluid:  2.0L/day  Casey Campbell MS, RD, LDN Please refer to AMION for RD and/or RD on-call/weekend/after hours pager 

## 2022-03-17 NOTE — Progress Notes (Signed)
[facility]  PROGRESS NOTE   Subjective:   Late entry.  Patient seen earlier in the day. Continues to have good urine output. Awake and alert today.  Objective:  Vital signs: Blood pressure (!) 150/92, pulse 86, temperature 99.9 F (37.7 C), temperature source Axillary, resp. rate 18, height 5\' 9"  (1.753 m), weight (!) 204 kg, SpO2 98 %.  Intake/Output Summary (Last 24 hours) at 03/17/2022 2126 Last data filed at 03/17/2022 1201 Gross per 24 hour  Intake 2877.83 ml  Output 2100 ml  Net 777.83 ml    Filed Weights     Physical Exam: General:  No acute distress, resting comfortably  Head:  Normocephalic, atraumatic. Moist oral mucosal membranes  Eyes:  Anicteric  Lungs:  Scattered rhonchi tracheostomy in place  Heart:  Regular on monitor  Abdomen:   Soft, nontender, bowel sounds present  Extremities: 1+ peripheral edema.  Neurologic: Awake, alert.  Skin:  No lesions  Access: Right IJ temp cath placed on 0/86/76    Basic Metabolic Panel: Recent Labs  Lab 03/13/22 0339 03/13/22 0348 03/14/22 0551 03/15/22 0556 03/16/22 0309 03/17/22 0614  NA  --  143 145 149* 150* 150*  K  --  4.4 4.1 4.4 4.1 3.9  CL  --  106 107 111 113* 112*  CO2  --  27 29 28 29 29   GLUCOSE  --  93 98 93 105* 140*  BUN  --  63* 59* 54* 47* 41*  CREATININE  --  2.65* 2.29* 2.02* 1.61* 1.24  CALCIUM  --  8.3* 8.5* 8.4* 8.0* 8.4*  MG 2.2  --  2.0 2.0 1.8 1.8  PHOS 6.3* 6.4* 6.1*  6.2* 6.0*  6.0* 4.3 3.6     CBC: Recent Labs  Lab 03/13/22 0339 03/14/22 0551 03/15/22 0556 03/16/22 0309 03/17/22 0614  WBC 6.7 6.6 6.3 6.7 6.5  NEUTROABS 4.4 4.7 4.6 4.6 4.5  HGB 8.1* 8.6* 9.1* 8.2* 9.1*  HCT 28.0* 29.7* 32.0* 28.7* 31.8*  MCV 92.7 91.4 95.0 94.7 95.2  PLT 200 208 249 228 274      Urinalysis: No results for input(s): "COLORURINE", "LABSPEC", "PHURINE", "GLUCOSEU", "HGBUR", "BILIRUBINUR", "KETONESUR", "PROTEINUR", "UROBILINOGEN", "NITRITE", "LEUKOCYTESUR" in the last  72 hours.  Invalid input(s): "APPERANCEUR"     Imaging: No results found.   Medications:    sodium chloride 10 mL/hr at 03/14/22 1036   feeding supplement (VITAL 1.5 CAL) 60 mL/hr at 03/17/22 1201    apixaban  5 mg Per Tube BID   carvedilol  3.125 mg Per Tube BID WC   Chlorhexidine Gluconate Cloth  6 each Topical Q0600   escitalopram  10 mg Per Tube Daily   feeding supplement (PROSource TF20)  60 mL Per Tube TID   free water  150 mL Per Tube Q2H   insulin aspart  0-20 Units Subcutaneous Q4H   insulin aspart  4 Units Subcutaneous Q4H   insulin detemir  22 Units Subcutaneous BID   ipratropium-albuterol  3 mL Nebulization Q6H   multivitamin  1 tablet Per Tube QHS   mupirocin ointment   Nasal BID   nutrition supplement (JUVEN)  1 packet Per Tube BID BM   mouth rinse  15 mL Mouth Rinse Q2H   pantoprazole  40 mg Per Tube Daily   sevelamer carbonate  2.4 g Per Tube TID WC    Assessment/ Plan:     46 y.o. male with medical problems of poorly controlled diabetes, hypertension, systolic CHF, obstructive sleep  apnea, obesity    admitted on 02/11/2022 for  Principal Problem:   Acute respiratory failure with hypoxia and hypercarbia (HCC) Active Problems:   Acute on chronic systolic CHF (congestive heart failure) (HCC)   Hypertensive urgency   Type 2 diabetes mellitus with peripheral neuropathy (HCC)   Acute heart failure with preserved ejection fraction (HCC)   Renal failure (ARF), acute on chronic (HCC)   Hospital-acquired pneumonia   ARDS (adult respiratory distress syndrome) (HCC)   Pressure injury of skin    # Acute kidney injury: Acute kidney injury is most likely secondary to acute tubular necrosis possibly due to hypotension, fever and sepsis.  Patient became oligoanuric with metabolic acidosis and hyperkalemia.   -Patient currently not requiring dialysis.  Creatinine 1.24.  Good urine output noted.  No immediate need for dialysis.  # Hyperphosphatemia -Phosphorus down  to 3.6.  Continue Renvela powder.  # Hyperkalemia:  -Potassium is normalized to 3.9.  #Chronic systolic CHF with volume overload 2D echo February 16, 2022-LVEF 40 to 45%, mild concentric LVH moderately enlarged right ventricle, moderately dilated left atrium and right atrium.   #4  Acute hypoxic Vent dependent respiratory failure:  Still requiring periodic vent assistance.   #5 Sepsis: Management as per ICU and ID teams. - completed linezolid and Augmentin.      LOS: 73 Samuel Chavez [facility] 8/28/20239:26 PM

## 2022-03-17 NOTE — Progress Notes (Signed)
Physical Therapy Treatment Patient Details Name: Samuel Chavez MRN: [id number] DOB: 03/20/1976 Today's Date: 03/17/2022   History of Present Illness Pt is 81 YOM admitted for PNA, renail failure, AKI, hyperkalemia, CHF, Resp failure, sepsis, and is s/p trach placement. PMH includes: DM, HTN, sCHF, OSA, obesity, ARDS Resp failure, sepsis. G tube placement 8/25.    PT Comments    Pt presents to PT in bed and agreeable to participate in therapy services. Pt was pleasant and motivated to participate during the session and put forth good effort throughout. With significant assistance, pt was able to sit EOB and tolerated for 6-8 min. Displayed R lateral lean and required frequent minA to correct and return to upright posture. Able to perform AAROM LE therapeutic exercises seated while seated EOB. Pt also performed 2 STS initiations, unable to clear bed, reported back pain, and was assisted to return to supine. Would benefit from skilled PT at SNF to address above deficits in functional mobility, strength, balance, and activity tolerance to promote optimal return to PLOF.     Recommendations for follow up therapy are one component of a multi-disciplinary discharge planning process, led by the attending physician.  Recommendations may be updated based on patient status, additional functional criteria and insurance authorization.  Follow Up Recommendations  Skilled nursing-short term rehab (<3 hours/day) Can patient physically be transported by private vehicle: No   Assistance Recommended at Discharge Frequent or constant Supervision/Assistance  Patient can return home with the following Two people to help with walking and/or transfers;Two people to help with bathing/dressing/bathroom;Direct supervision/assist for medications management;Help with stairs or ramp for entrance;Assist for transportation;Assistance with feeding;Assistance with cooking/housework;Direct supervision/assist for financial  management   Equipment Recommendations  None recommended by PT    Recommendations for Other Services       Precautions / Restrictions Precautions Precautions: Fall Precaution Comments: contact precautions Restrictions Weight Bearing Restrictions: No Other Position/Activity Restrictions: HOB 30 deg     Mobility  Bed Mobility Overal bed mobility: Needs Assistance Bed Mobility: Rolling, Sit to Supine, Supine to Sit Rolling: +2 for physical assistance, Total assist   Supine to sit: Max assist, +4 for physical assistance, Sit to supine: Max assist, +4 for physical assistance,   General bed mobility comments: PEG surgery 8/25, +4 assistance for all bed mobility tasks    Transfers                   General transfer comment: unsafe to attempt    Ambulation/Gait               General Gait Details: unable to attempt d/t safety/weakness   Stairs             Wheelchair Mobility    Modified Rankin (Stroke Patients Only)       Balance Overall balance assessment:  (unsafe to attempt)                                          Cognition Arousal/Alertness: Awake/alert Behavior During Therapy: Flat affect, WFL for tasks assessed/performed Overall Cognitive Status: Within Functional Limits for tasks assessed                                 General Comments: asnwers appropriately with head nods        Exercises Total  Joint Exercises Long Arc Quad: AAROM, Left, 10 reps    General Comments        Pertinent Vitals/Pain Pain Assessment Breathing: occasional labored breathing, short period of hyperventilation Negative Vocalization: none Facial Expression: facial grimacing Body Language: relaxed Consolability: no need to console PAINAD Score: 3 Pain Location: back Pain Descriptors / Indicators: Grimacing Pain Intervention(s): Limited activity within patient's tolerance, Monitored during session, Repositioned     Home Living                          Prior Function            PT Goals (current goals can now be found in the care plan section) Acute Rehab PT Goals PT Goal Formulation: Patient unable to participate in goal setting Progress towards PT goals: Progressing toward goals    Frequency    Min 2X/week      PT Plan Current plan remains appropriate    Co-evaluation PT/OT/SLP Co-Evaluation/Treatment: Yes Reason for Co-Treatment: Necessary to address cognition/behavior during functional activity;For patient/therapist safety;Complexity of the patient's impairments (multi-system involvement);To address functional/ADL transfers PT goals addressed during session: Mobility/safety with mobility;Balance OT goals addressed during session: ADL's and self-care      AM-PAC PT "6 Clicks" Mobility   Outcome Measure  Help needed turning from your back to your side while in a flat bed without using bedrails?: Total Help needed moving from lying on your back to sitting on the side of a flat bed without using bedrails?: Total Help needed moving to and from a bed to a chair (including a wheelchair)?: Total Help needed standing up from a chair using your arms (e.g., wheelchair or bedside chair)?: Total Help needed to walk in hospital room?: Total Help needed climbing 3-5 steps with a railing? : Total 6 Click Score: 6    End of Session Equipment Utilized During Treatment: Oxygen Activity Tolerance: Patient tolerated treatment well Patient left: in bed;with call bell/phone within reach;with family/visitor present (other staff members present in room) Nurse Communication: Mobility status PT Visit Diagnosis: Muscle weakness (generalized) (M62.81);Other abnormalities of gait and mobility (R26.89)     Time: 1500-1530 PT Time Calculation (min) (ACUTE ONLY): 30 min  Charges:                        Glenice Laine MPH, SPT 03/17/22, 4:25 PM

## 2022-03-17 NOTE — Progress Notes (Signed)
Occupational Therapy Treatment Patient Details Name: Samuel Chavez MRN: [id number] DOB: 1975-11-01 Today's Date: 03/17/2022   History of present illness Pt is 9 YOM admitted for PNA, renail failure, AKI, hyperkalemia, CHF, Resp failure, sepsis, and is s/p trach placement. PMH includes: DM, HTN, sCHF, OSA, obesity, ARDS Resp failure, sepsis.   OT comments  Samuel Chavez was seen for OT treatment on this date. Upon arrival to room pt reclined in bed, family at bed side, pt agreeable to tx. Pt requires MOD A self-feeding ice chips, MIN A face washing at bed level. TOTAL A x3 sup<>sit and fluctuating MAX - MOD A static sitting. Pt tolerated ~15 mins sitting EOB with fair trunk control during lateral lean exercises. Facilitated anterior/posterior weight shifting in preparation for standing trials. MAX A x2 don/doff socks seated EOB. Reviewed HEP with family in room. Pt making good progress toward goals, will continue to follow POC. Discharge recommendation updated to OT follow up at [facility].    Recommendations for follow up therapy are one component of a multi-disciplinary discharge planning process, led by the attending physician.  Recommendations may be updated based on patient status, additional functional criteria and insurance authorization.    Follow Up Recommendations  OT at Long-term acute care hospital    Assistance Recommended at Discharge Frequent or constant Supervision/Assistance  Patient can return home with the following  Two people to help with walking and/or transfers;Two people to help with bathing/dressing/bathroom   Equipment Recommendations  Other (comment) (defer)    Recommendations for Other Services      Precautions / Restrictions Precautions Precautions: Fall Precaution Comments: contact precautions Restrictions Weight Bearing Restrictions: No Other Position/Activity Restrictions: HOB 30 deg       Mobility Bed Mobility Overal bed mobility: Needs Assistance Bed  Mobility: Rolling, Sit to Supine, Supine to Sit Rolling: Total assist, +2 for physical assistance   Supine to sit: Total assist, +2 for physical assistance Sit to supine: Total assist, +2 for physical assistance   General bed mobility comments: +3 TOTAL A for mobility, additional assist for lines mgmt    Transfers                         Balance Overall balance assessment: Needs assistance Sitting-balance support: Single extremity supported, Feet supported Sitting balance-Leahy Scale: Poor Sitting balance - Comments: MOD A sttic sitting balance, intermittently MIN A, fair use of trunk to control lateral weight shifting                                   ADL either performed or assessed with clinical judgement   ADL Overall ADL's : Needs assistance/impaired                                       General ADL Comments: MOD A self-feeding ice chips, MIN A face washing at bed level. MAX A x2 don/doff socks seated EOB.      Cognition Arousal/Alertness: Awake/alert Behavior During Therapy: WFL for tasks assessed/performed Overall Cognitive Status: Within Functional Limits for tasks assessed                                 General Comments: asnwers appropriately with head nods. verbalizes "back hurts" and "  water"        Exercises Other Exercises Other Exercises: reviewed HEP            Pertinent Vitals/ Pain       Pain Assessment Pain Assessment: Faces Faces Pain Scale: Hurts little more Pain Location: back Pain Descriptors / Indicators: Grimacing Pain Intervention(s): Limited activity within patient's tolerance, Repositioned   Frequency  Min 2X/week        Progress Toward Goals  OT Goals(current goals can now be found in the care plan section)  Progress towards OT goals: Progressing toward goals  Acute Rehab OT Goals Patient Stated Goal: to drink water OT Goal Formulation: With patient/family Time For  Goal Achievement: 03/26/22 Potential to Achieve Goals: Fair ADL Goals Pt Will Perform Grooming: with min assist;bed level Pt Will Transfer to Toilet: with max assist;with +2 assist Pt/caregiver will Perform Home Exercise Program: Increased ROM;Increased strength;Right Upper extremity;Left upper extremity;With minimal assist  Plan Discharge plan remains appropriate;Frequency remains appropriate    Co-evaluation    PT/OT/SLP Co-Evaluation/Treatment: Yes Reason for Co-Treatment: Necessary to address cognition/behavior during functional activity;For patient/therapist safety;Complexity of the patient's impairments (multi-system involvement);To address functional/ADL transfers PT goals addressed during session: Mobility/safety with mobility;Balance OT goals addressed during session: ADL's and self-care      AM-PAC OT "6 Clicks" Daily Activity     Outcome Measure   Help from another person eating meals?: A Lot Help from another person taking care of personal grooming?: A Lot Help from another person toileting, which includes using toliet, bedpan, or urinal?: Total Help from another person bathing (including washing, rinsing, drying)?: Total Help from another person to put on and taking off regular upper body clothing?: A Lot Help from another person to put on and taking off regular lower body clothing?: A Lot 6 Click Score: 10    End of Session    OT Visit Diagnosis: Muscle weakness (generalized) (M62.81);Other abnormalities of gait and mobility (R26.89)   Activity Tolerance Patient tolerated treatment well   Patient Left in bed;with call bell/phone within reach;with family/visitor present   Nurse Communication          Time: 1610-9604 OT Time Calculation (min): 42 min  Charges: OT General Charges $OT Visit: 1 Visit OT Treatments $Self Care/Home Management : 8-22 mins $Therapeutic Activity: 8-22 mins  Dessie Coma, M.S. OTR/L  03/17/22, 4:24 PM  ascom [phone]

## 2022-03-17 NOTE — Consult Note (Addendum)
PHARMACY CONSULT NOTE  Pharmacy Consult for Electrolyte Monitoring and Replacement   Recent Labs: Potassium (mmol/L)  Date Value  03/17/2022 3.9   Magnesium (mg/dL)  Date Value  03/17/2022 1.8   Calcium (mg/dL)  Date Value  03/17/2022 8.4 (L)   Albumin (g/dL)  Date Value  03/17/2022 2.6 (L)   Phosphorus (mg/dL)  Date Value  03/17/2022 3.6   Sodium (mmol/L)  Date Value  03/17/2022 150 (H)   Assessment: Patient is a 46 y/o M with medical history including DM, HTN, pancreatitis, tobacco use disorder, systolic CHF, OSA, DM c/b diabetic neuropathy, lumbar radiculopathy, morbid obesity who is admitted with acute respiratory failure in setting of acute CHF and hypertensive urgency. Pharmacy consulted to assist with electrolyte monitoring and replacement as indicated.  Admission complicated by renal insufficiency requiring renal replacement therapy at one point which has now been discontinued with renal recovery.  Nutrition: tube feeds @70ml /h, FWF 175ml q2h, Juven supplement, Renvela,   Goal of Therapy:  Electrolytes within normal limits  Plan:  Scr 6>2.02>1.61>1.24; UOP 0.3>0.75ml/k/h [Net I/O -15L] Na 150>150, 2/2 free water deficit. Pending ability to use PEG tube. D5w at 50 cc/hr Mg 1.8>1.8, after receiving magnesium sulfate 2 g IV x 1. No further repletion at this time. Hyperphosphatemia resolved. Renvela has been on hold as patient has not had enteral access. Suspect this can be discontinued soon if renal recovery continues. Will defer decision to nephrology --Follow-up electrolytes with AM labs tomorrow  Lorna Dibble  03/17/2022 8:07 AM

## 2022-03-17 NOTE — Progress Notes (Signed)
Subjective:  CC: Samuel Chavez is a 46 y.o. male  Hospital stay day 34, 3 Days Post-Op robotic assisted gastrostomy tube placement  HPI: No acute issues reported overnight.  ROS:  General: Denies weight loss, weight gain, fatigue, fevers, chills, and night sweats. Heart: Denies chest pain, palpitations, racing heart, irregular heartbeat, leg pain or swelling, and decreased activity tolerance. Respiratory: Denies breathing difficulty, shortness of breath, wheezing, cough, and sputum. GI: Denies change in appetite, heartburn, nausea, vomiting, constipation, diarrhea, and blood in stool. GU: Denies difficulty urinating, pain with urinating, urgency, frequency, blood in urine.   Objective:   Temp:  [96.8 F (36 C)-100 F (37.8 C)] 99.9 F (37.7 C) (08/28 1200) Pulse Rate:  [72-92] 89 (08/28 1057) Resp:  [11-20] 17 (08/28 1057) BP: (132-164)/(75-139) 161/98 (08/28 0800) SpO2:  [88 %-100 %] 99 % (08/28 1414) FiO2 (%):  [28 %-40 %] 40 % (08/28 1414)     Height: 5\' 9"  (175.3 cm) Weight:  (scale not working) BMI (Calculated): 66.38   Intake/Output this shift:   Intake/Output Summary (Last 24 hours) at 03/17/2022 1544 Last data filed at 03/17/2022 1201 Gross per 24 hour  Intake 3117.83 ml  Output 3001 ml  Net 116.83 ml    Constitutional :  alert, cooperative, appears stated age, and no distress  Respiratory:  clear to auscultation bilaterally  Cardiovascular:  regular rate and rhythm  Gastrointestinal: soft, non-tender; bowel sounds normal; no masses,  no organomegaly.  Feeding tube intact, no issues.  Skin: Cool and moist. Gastrostomy site dressing clean,   Psychiatric: Normal affect, non-agitated, not confused       LABS:     Latest Ref Rng & Units 03/17/2022    6:14 AM 03/16/2022    3:09 AM 03/15/2022    5:56 AM  CMP  Glucose 70 - 99 mg/dL 140  105  93   BUN 6 - 20 mg/dL 41  47  54   Creatinine 0.61 - 1.24 mg/dL 1.24  1.61  2.02   Sodium 135 - 145 mmol/L 150  150  149    Potassium 3.5 - 5.1 mmol/L 3.9  4.1  4.4   Chloride 98 - 111 mmol/L 112  113  111   CO2 22 - 32 mmol/L 29  29  28    Calcium 8.9 - 10.3 mg/dL 8.4  8.0  8.4       Latest Ref Rng & Units 03/17/2022    6:14 AM 03/16/2022    3:09 AM 03/15/2022    5:56 AM  CBC  WBC 4.0 - 10.5 K/uL 6.5  6.7  6.3   Hemoglobin 13.0 - 17.0 g/dL 9.1  8.2  9.1   Hematocrit 39.0 - 52.0 % 31.8  28.7  32.0   Platelets 150 - 400 K/uL 274  228  249     RADS: N/a Assessment:   S/p robotic assisted laparoscopic gastrostomy tube placement.  No issues. Tolerating feeds well.    Surgery to sign off for now.  Will return in one week for suture removal at bumper site.  Call with any questions in the meantime.  labs/images/medications/previous chart entries reviewed personally and relevant changes/updates noted above.

## 2022-03-18 DIAGNOSIS — J9602 Acute respiratory failure with hypercapnia: Secondary | ICD-10-CM | POA: Diagnosis not present

## 2022-03-18 DIAGNOSIS — J9601 Acute respiratory failure with hypoxia: Secondary | ICD-10-CM | POA: Diagnosis not present

## 2022-03-18 LAB — RENAL FUNCTION PANEL
Albumin: 2.5 g/dL — ABNORMAL LOW (ref 3.5–5.0)
Anion gap: 5 (ref 5–15)
BUN: 42 mg/dL — ABNORMAL HIGH (ref 6–20)
CO2: 28 mmol/L (ref 22–32)
Calcium: 8.1 mg/dL — ABNORMAL LOW (ref 8.9–10.3)
Chloride: 114 mmol/L — ABNORMAL HIGH (ref 98–111)
Creatinine, Ser: 1.07 mg/dL (ref 0.61–1.24)
GFR, Estimated: >60 mL/min (ref >60)
Glucose, Bld: 138 mg/dL — ABNORMAL HIGH (ref 70–99)
Phosphorus: 2.7 mg/dL (ref 2.5–4.6)
Potassium: 3.6 mmol/L (ref 3.5–5.1)
Sodium: 147 mmol/L — ABNORMAL HIGH (ref 135–145)

## 2022-03-18 LAB — CBC WITH DIFFERENTIAL/PLATELET
Abs Immature Granulocytes: 0.02 10*3/uL (ref 0.00–0.07)
Basophils Absolute: 0 10*3/uL (ref 0.0–0.1)
Basophils Relative: 0 %
Eosinophils Absolute: 0.4 10*3/uL (ref 0.0–0.5)
Eosinophils Relative: 6 %
HCT: 29.1 % — ABNORMAL LOW (ref 39.0–52.0)
Hemoglobin: 8.2 g/dL — ABNORMAL LOW (ref 13.0–17.0)
Immature Granulocytes: 0 %
Lymphocytes Relative: 18 %
Lymphs Abs: 1.2 10*3/uL (ref 0.7–4.0)
MCH: 26.5 pg (ref 26.0–34.0)
MCHC: 28.2 g/dL — ABNORMAL LOW (ref 30.0–36.0)
MCV: 94.2 fL (ref 80.0–100.0)
Monocytes Absolute: 0.8 10*3/uL (ref 0.1–1.0)
Monocytes Relative: 11 %
Neutro Abs: 4.6 10*3/uL (ref 1.7–7.7)
Neutrophils Relative %: 65 %
Platelets: 289 10*3/uL (ref 150–400)
RBC: 3.09 MIL/uL — ABNORMAL LOW (ref 4.22–5.81)
RDW: 16.8 % — ABNORMAL HIGH (ref 11.5–15.5)
WBC: 7.1 10*3/uL (ref 4.0–10.5)
nRBC: 0 % (ref 0.0–0.2)

## 2022-03-18 LAB — GLUCOSE, CAPILLARY
Glucose-Capillary: 132 mg/dL — ABNORMAL HIGH (ref 70–99)
Glucose-Capillary: 99 mg/dL (ref 70–99)
Glucose-Capillary: 130 mg/dL — ABNORMAL HIGH (ref 70–99)
Glucose-Capillary: 147 mg/dL — ABNORMAL HIGH (ref 70–99)
Glucose-Capillary: 96 mg/dL (ref 70–99)
Glucose-Capillary: 106 mg/dL — ABNORMAL HIGH (ref 70–99)

## 2022-03-18 LAB — MAGNESIUM: Magnesium: 1.8 mg/dL (ref 1.7–2.4)

## 2022-03-18 MED ORDER — CARVEDILOL 6.25 MG PO TABS
6.2500 mg | ORAL_TABLET | Freq: Two times a day (BID) | ORAL | Status: DC
  Administered 2022-03-18 – 2022-03-24 (×13): 6.25 mg
  Filled 2022-03-18 (×14): qty 1

## 2022-03-18 MED ORDER — GABAPENTIN 250 MG/5ML PO SOLN
100.0000 mg | Freq: Two times a day (BID) | ORAL | Status: DC
  Administered 2022-03-18 – 2022-03-20 (×4): 100 mg
  Filled 2022-03-18 (×4): qty 2

## 2022-03-18 NOTE — Progress Notes (Signed)
This RN assisted patient with dinner tray. Patient was eager to be able to eat clear liquids as he has been NPO for awhile. ST hopes to transition patient to a soft diet on Wednesday. I spoke at length with patient about his life, family, siblings, children and grandchildren. Patient is close to his sisters. He is extremely eager to get out of the chair and get into bed. PT and OT are to arrange schedule Wednesday to help get him moved to chair.

## 2022-03-18 NOTE — Consult Note (Signed)
PHARMACY CONSULT NOTE  Pharmacy Consult for Electrolyte Monitoring and Replacement   Recent Labs: Potassium (mmol/L)  Date Value  03/18/2022 3.6   Magnesium (mg/dL)  Date Value  03/18/2022 1.8   Calcium (mg/dL)  Date Value  03/18/2022 8.1 (L)   Albumin (g/dL)  Date Value  03/18/2022 2.5 (L)   Phosphorus (mg/dL)  Date Value  03/18/2022 2.7   Sodium (mmol/L)  Date Value  03/18/2022 147 (H)   Assessment: Patient is a 46 y/o M with medical history including DM, HTN, pancreatitis, tobacco use disorder, systolic CHF, OSA, DM c/b diabetic neuropathy, lumbar radiculopathy, morbid obesity who is admitted with acute respiratory failure in setting of acute CHF and hypertensive urgency. Pharmacy consulted to assist with electrolyte monitoring and replacement as indicated.  Admission complicated by renal insufficiency requiring renal replacement therapy at one point which has now been discontinued with renal recovery.  Nutrition: tube feeds @70ml /h, FWF 125>11ml q2h, Juven supplement, Renvela,   Goal of Therapy:  Electrolytes within normal limits  Plan:  Scr 1.24>1.07; UOP 0.7>0.28ml/k/h [Net I/O -13.4L] Na 150>147, 2/2 free water deficit. (See above) Hyperphosphatemia resolved. Renvela has been on hold as patient has not had enteral access. Suspect this can be discontinued soon if renal recovery continues. Will defer decision to nephrology --Follow-up electrolytes with AM labs tomorrow  Lorna Dibble  03/18/2022 8:07 AM

## 2022-03-18 NOTE — Progress Notes (Signed)
Speech Language Pathology Treatment: Dysphagia;Passy Muir Speaking valve  Patient Details Name: Samuel Chavez MRN: [id number] DOB: 23-Jul-1975 Today's Date: 03/18/2022 Time: 1210-1330 SLP Time Calculation (min) (ACUTE ONLY): 80 min  Assessment / Plan / Recommendation Clinical Impression  Pt seen for both PMV tx session for toleration of PMV wear/use then assessment of swallowing for toleration of po's to initiate and oral diet -- clear liquid diet(thins) to begin with.  Pt continues on TC wean today off vent support during daytime (since this morning per NSG) w/ nocturnal vent, per RT. Pt was alert, mouthing and nodding head during session; resting in bed. Children arrived toward end of sessions.    Explained the use and wear of the PMV again to pt; trach and stoma area inspected; HR 80s, O2 sats upper 99-100%; RR 18-20. FiO2 40% at 8-10L per chart. Ensured Cuff was deflated prior to PMV placement. Pt's RR: 18-22, O2 sats 99-100%, HR low 80s throughout sessions. Pt verbalized indicating his goal was to "sit in a chair still" and to have "water". Verbalizations were c/b low volume and decreased breath support limiting speech/responses to mainly the Word-level. Intelligibility adequate at the Word-level but decreased at the Phrase-level d/t reduced breath support/effort. Practiced automatic speech tasks w/ increased volume as a goal using increased breath support w/ breaks b/t ~1-2 words to replenish breath. With effort, pt was able to achieve increased verbalizations. Vocal quality was gravely, min strained. Encouraged pt to use his breath support/effort to increase volume which he was able to do during verbal engagements. PMV placement was tolerated during session for ~45+ mins w/ no change in ANS. No overt discomfort noted in his respiratory effort -- pt stated it felt "fine" to wear/talk w/ PMV in place. No increased effort noted in respirations during the expiratory phase; no overt use of accessary  muscles or distress was noted in his breathing pattern.  Discussed w/ pt that increasing Stamina for PMV wear/use is best done Slowly. Pt nodded in agreement. Education provided on PMV wear, talking in conversation, use of an incentive spirometer or flutter valve -- per discussion w/ RT, in hopes to continue to improve breath support for communication. RT to f/u.    Pt appears to adequately tolerate PMV placement w/out overt respiratory discomfort or distress; ANS remained adequately stable at his Baseline during wear/use. Suspect the Shiley trach size, #8, SLT is adequate for comfortable PMV wear/use.  Much education was given on PMV use/wear, MUST have Cuff deflation for PMV wear, checking and removing the air from the balloon b/f placing, placing/removing the PMV, and care of the PMV. Discussed that PMV can be worn in increments but must NOT be worn when sleeping, drowsy. Encouraged Rest Breaks at times during the day. Precautions posted at bedside and in chart.  Pt remains w/ a Cuffed trach during his wean at this time. Sticker placed on Cuff line, and in room. NSG made aware. MD updated. ST services will continue to follow for ongoing tx; see POC, goals.      DYSPHAGIA TREATMENT:  Pt seen for ongoing assessment of swallowing. He is able to wear the PMV comfortably now for verbal communication. See session noted above.   NSG reported no noted coughing during PRN ice chips. Oral care completed. Pt was positioned w/ head forward more; bed positioned forward.  Pt explained general aspiration precautions and agreed verbally/head nodded to the need for following them especially sitting upright for all oral intake, No Talking w/ food and  drink in mouth, and wearing PMV for ALL oral intake. Pt assisted w/ feeding d/t weak UEs bilat. Trials of ice chips then thin liquids via straw given monitoring for small sips slowly. NO overt clinical s/s of aspiration were noted w/ any consistency; No coughing, respiratory  status remained calm and unlabored, vocal quality clear b/t trials. O2 sats 99-100%; HR 82; RR 19. Oral phase appeared grossly wfl w/ bolus management; timely A-P transfer and full oral clearing achieved. Noted a slight tongue forward position when sipping on straw; pt corrected and utilized more labial seal when instructed.     Discussed pt's presentation and tx sessions w/ MD/NSG and RT. MD agreed w/ initiation of thin liquids/diet - clear liquid diet today.   Recommended a clear liquids diet; Thin liquids. Recommend strict aspiration precautions; tray setup and positioning assistance Upright for meals. PMV MUST BE PLACED FOR ALL ORAL INTAKE. Monitoring w/ all oral intake by [place] staff. ST services will continue to f/u w/ pt for toleration of diet and trials to upgrade diet next 1-2 days. Education on aspiration precautions. NSG and MD agreed on above. Precautions posted at bedside for PMV wear/use and aspiration precautions; aspiration precautions.        HPI HPI: Pt is 61 YOM admitted for PNA, renail failure, AKI, hyperkalemia, CHF, Resp failure, sepsis, and is s/p trach placement on 02/25/22 -- #8 shiley XLT.   PMH includes: Morbid Obesity, ARDS, type 2 diabetes mellitus, hypertension, pancreatitis, ongoing tobacco abuse, systolic CHF, OSA, type 2 diabetes mellitus and diabetic neuropathy, lumbar radiculopathy.  Admitting DXs: Acute respiratory failure with hypoxia and hypercarbia. Prior to hospital admission, pt was Independent for mobility and ADLs. Pt lives with family, communication limited to yes/no with head nods 2/2 tracheostomy.   CXR on 8/16: mild pulmonary edema.      SLP Plan  Continue with current plan of care      Recommendations for follow up therapy are one component of a multi-disciplinary discharge planning process, led by the attending physician.  Recommendations may be updated based on patient status, additional functional criteria and insurance authorization.     Recommendations  Diet recommendations: Thin liquid (clear liquid diet) Liquids provided via: Cup;Straw Medication Administration: Via alternative means (PEG) Supervision: Staff to assist with self feeding;Full supervision/cueing for compensatory strategies Compensations: Minimize environmental distractions;Slow rate;Small sips/bites Postural Changes and/or Swallow Maneuvers: Out of bed for meals;Seated upright 90 degrees;Upright 30-60 min after meal      Patient may use Passy-Muir Speech Valve: During all waking hours (remove during sleep);Caregiver trained to provide supervision;Intermittently with supervision;During PO intake/meals PMSV Supervision: Intermittent MD: Please consider changing trach tube to :  (n/a currently)         General recommendations:  (following) Oral Care Recommendations: Oral care BID;Oral care before and after PO;Staff/trained caregiver to provide oral care Follow Up Recommendations: Acute inpatient rehab (3hours/day) (TBD vs LTACH) Assistance recommended at discharge: Intermittent Supervision/Assistance SLP Visit Diagnosis: Dysphagia, unspecified (R13.10);[place] (R49.1) Plan: Continue with current plan of care               Orinda Kenner, [place], [place]; [place] [phone] (ascom) Samuel Chavez  03/18/2022, 4:19 PM

## 2022-03-18 NOTE — Progress Notes (Signed)
NAME:  Samuel Chavez, MRN:  [id number], DOB:  1975-12-15, LOS: 63 ADMISSION DATE:  02/11/2022  History of Present Illness:  46 y.o male with significant PMH as below who presented to the ED with chief complaints of SOB and worsening bilateral lower extremities edema.   ED Course: In the emergency department, the temperature was 37.3C, the heart rate 99 beats/minute, the blood pressure 187/102 mm Hg, the respiratory rate 20 breaths/minute, and the oxygen saturation 89% on. He was noted to be drowsy but still protecting his airway and responding appropriately.   Pertinent  Medical History   Past Medical History:  Diagnosis Date   Diabetes mellitus without complication ([place])    Hypertension    Pancreatitis     Significant Hospital Events: Including procedures, antibiotic start and stop dates in addition to other pertinent events   7/25: Admitted to hospitalist service w/acute on chronic hypoxic hypercapnic resp. failure requiring BiPAP.Failed BiPAP and intubated. PCCM consulted 7/26: INTUBATED, difficlut to sedate, started [place] 7/27: severe hypoxia, PEEP increased to 15 but decreased back to 10 7/28: Persistent severe hypoxia, ventilator changes made 7/29: Persistent hypoxia, recruitment maneuvers instituted, ventilator changes made, empiric heparin as patient cannot be scanned query PE -02/17/22- patient is severely critically ill with bilateral multifocal pneumonia on sedation and paralysis with mechanical ventilation maximal settings.  He is at cusp of death, we met with daughter today and discussed severity of illness. Unable to perform CT due to severity of critical illness. Met with previous PCCM doc and discussed medical plan.  Patient had recruitment maneuvers last few days without improvement, on heparin gtt for possible PE.  02/18/22- patient continues to require maximal settings on ventilator despite good UOP.  He destaturated to <50% spO2 on MV today required BGV.    02/19/22- patient continues to require 100% FiO2 on maximal setting with high PEEP ladder for possible ARDS.  He was diuresed and on steroids with development of AKI.  His CXR had slight interval improvement on right. We discussed case with vascular surgery regarding possible empiric tPA for PE.  02/20/22- patient was unable to get CT today due to continued severe hypoxemia.  Daughter and other family members at bedside we reviewed case and medical plan.  There seems to be some confusion from family as they had asked me to wake patient up and take off ventilator even though I had explained multiple times that he is at very high risk for death.  They laughed at my comments and seemed to think it was not true. We may still be able to do bronchoscopy but its high risk.  We have reduced IV infusions by switching some medications to OGT route.  02/21/22- patient is weaned to 60%FiO2.  Plan for possible bronch if patient is tolerating.  Family at bedside.  If able to we will obtain more imaging and VQ scan to rule out PE.  02/22/22- Patient weaned to 50% on PRVC.  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 CBC stable , CMP with improved GFR. Monitoring Na, pharmacy and renal following for electrolytes/renal function.  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  Overall marked improvement on ventilator over past 48h. Met with daughter at bedside reviewed medical plan.  02/24/22-Vent requirements continue to slowly improve, currently 40% FiO2 and 14 PEEP.  AKI continues to slowly improve, remains hyperkalemic with potassium of 5.8.  Placed back on  Veltassa, Diurese x1.  Trach scheduled for tomorrow at 1:15 PM 02/25/22- Diurese with Lasix x1 dose. Trach scheduled for today. 02/26/22: Remains mechanically ventilated via newly placed tracheostomy will attempt to wean ventilator settings today.  Perform WUA  02/26/22: Pt remains mechanically  ventilated via tracheostomy vent settings: PEEP 14/FiO2 80% 02/27/22: Fevers, ID consulted. Repeating Tracheal aspirate and UA.  Zosyn started 02/28/22: Persistent fevers, Tracheal aspirate with Staph aureus, start Linezolid.  Worsening Creatinine, considering Lasix gtt given high vent requirements (100% FiO2, 14 peep).  D/c fentanyl gtt and decrease oral benzos/narcotics 03/01/22: worsening anuric renal failure. HD line placed and started on hemodialysis. 03/02/22: Vent support slowly improving. Plan for HD again today. 8/14-8/15: remains febrile, encephalopathic despite HD sessions 8/16 fever curve improved, clots noted in LUE 8/18: continued low grade fever (t max 100.6).  Receiving HD.  Slight improvement in encephalopathy 8/19 off pressors, off sedation, remains on vent  8/20: Afebrile, tolerating PSV 8/21: Remains afebrile, obtain CT Abdomen for IR to evaluate for PEG placement.  Tolerating PSV (15/5).  Consult PT for passive ROM 8/22: Extremely weak, but able to MAE to command, nodding to questions. Tolerating PSV, will trial TC. Deemed not candidate for IR placement of PEG, will have General Surgery evaluate 8/23: No acute events overnight tolerating SBT 10/5 will attempt to transition to TCT today  8/24: No acute events overnight continues to tolerate SBT, will attempt TCT as tolerated  8/25: Tolerating TC during day and vent at night.  Strength improving.  Robotic assisted laparoscopic gastrostomy tube placement by General Surgery, heparin currently on hold for procedure. 8/26: tolerating vent at night, TC during day.  Awaiting Surgery approval to restart Heparin gtt today. Na increased to 149 as unable to use G-tube till tomorrow for free water flushes.  Will start low dose D5W infusion 8/27: Tolerated TC all day and throughout night yesterday, General Surgery approves use of G-tube for meds and feeds.  Transition Heparin gtt to Eliquis.  Resume feeds and free water flushes 8/28: Pt placed  back on ventilator overnight due to hypoxia vent settings SBT 10/10.  Tolerate trach collar and  PMV  8/29: No acute events overnight tolerated mechanical ventilation overnight; Will transition to trach collar   Consults:  PCCM Nephrology Vascular Surgery Palliative Care ENT Infectious Disease General Surgery  Procedures:  7/25: Intubation 7/26: PICC triple-lumen, right basilic vein 3/20: Left radial arterial line  8/08: Size 8 proximal XLT Shiley inserted 8/12: Right IJ HD catheter placed 8/25: Robotic assisted laparoscopic gastrostomy tube placement by General Surgery  Significant Diagnostic Tests:  7/25  Chest Xray:Chest x-ray showed cardiomegaly and pulmonary vascular congestion without overt pulmonary edema 7/25 Echocardiogram: difficult study, LVEF was estimated at 55 to 60%, dilated LV moderate LVH, grade 1 DD, enlargement of the right ventricle, left atrial size moderately dilated, right atrial size moderately dilated this is consistent with restrictive physiology (echo reading not congruous with prior echoes from [facility]) 7/29: Venous US BLE>>IMPRESSION: No lower extremity DVT. 7/30 Limited echo: LVEF 40 to 45% decreased LV function, no wall motion abnormalities, moderate dilation of the LV concentric LVH, right ventricular size moderately enlarged, right atrial enlargement, this is more consistent with prior [facility] scans 8/4: CT Head>>No acute intracranial abnormality. Paranasal sinusitis. Bilateral complete opacification of the mastoid air cells and inner ears, as can be seen in the setting of otitis media. Correlate with symptoms. 8/4: CT Chest/Abdomen/Pelvis>>Extensive ground-glass, alveolar and patchy dense infiltrates in both lungs suggesting multifocal pneumonia.  Part of this finding may suggest underlying pulmonary edema. Small bilateral pleural effusions are seen. Cardiomegaly. There is ectasia of the main pulmonary artery suggesting pulmonary  arterial hypertension. There is no evidence of intestinal obstruction or pneumoperitoneum. There is no hydronephrosis. Appendix is not dilated. UB diverticula are seen in the colon without signs of focal diverticulitis. Severe degenerative changes are noted at L4-L5 level with significant interval worsening. Findings may be due to severe disc degeneration. If there is clinical suspicion for discitis or osteomyelitis, follow-up MRI may be considered 8/4: Lung V/Q>>Pulmonary embolism absent. 8/15: Venous US BLE>>No evidence of deep venous thrombosis in either lower extremity 8/16: Venous US BLE>>1. Non-occlusive/nearly occlusive thrombus in one of paired right upper arm brachial veins containing the indwelling PICC line and occlusive thrombus in the right axillary vein. The visualized segment of the right subclavian vein is patent. No evidence of left upper extremity DVT.  Micro Data:  7/25: SARS-CoV-2 PCR> negative 7/25: MRSA PCR>> NEG 7/27: Strep pneumoniae Ag>> negative 7/27: Legionella Ag>> negative 7/27: Mycoplasma>> <770 7/27: Sputum>> Strep pneumo,Haemophilus influenzae 7/30 Sputum cult >> negative 8/4: Tracheal aspirate>>negative 8/9: Tracheal aspirate>>MRSA 8/10: RVP>>negative 8/10: Right nasal Wound culture>>STAPHYLOCOCCUS AUREUS, PROVOTELLA 8/10: MRSA PCR>> Positive 8/11: Blood culture 1/4>>Staphylococcus epidermidis MecA/C (suspect contaminant)  Antimicrobials:  Cefepime 7/27 >>7/31 Ceftriaxone 7/31>>8/4 Vancomycin 7/27 >> 7/29, restarted 7/30 (resumed due to fever spike on Maxipime) ? Resistant Strep pneumo >> 7/31 Unasyn 8/14>>8/17 Augmentin 8/17>>8/21 Linezolid 8/11>>8/20  Interim History / Subjective:  As outlined above under significant events   Objective   Blood pressure (!) 142/76, pulse 81, temperature 99.6 F (37.6 C), temperature source Oral, resp. rate 17, height 5' 9" (1.753 m), weight (!) 204 kg, SpO2 97 %.    Vent Mode: PSV FiO2 (%):  [30 %-40 %] 30  % PEEP:  [5 cmH20] 5 cmH20 Pressure Support:  [10 cmH20] 10 cmH20   Intake/Output Summary (Last 24 hours) at 03/18/2022 0741 Last data filed at 03/18/2022 0600 Gross per 24 hour  Intake 4706.83 ml  Output 2550 ml  Net 2156.83 ml   Filed Weights    Examination: GENERAL: Acute on chronically ill appearing male, awake and alert, on mechanical ventilator via tracheostomy NAD HENT: Atraumatic, normocephalic, neck supple, 8 XLT Tracheostomy dressing clean, dry and intact LUNGS: Clear throughout, no wheezing or rales, even, non labored  CV: NSR, RRR, s1s2, no M/R/G, 2+ generalized edema  ABDOMEN: Obese, soft, nontender, no guarding or rebound tenderness, BS+ x4; PEG tube intact  MUSCULOSKELETAL: Generalized weakness, moves all extremities  NEURO:  Alert, following/MAE to commands, PERRL  Resolved Hospital Problem list   Community-acquired pneumonia: Streptococcus pneumoniae and Haemophilus influenzae ~ TREATED MRSA HCAP ~ TREATED Provetella Sinusitis ~ TREATED  Assessment & Plan:  Acute on chronic hypoxemic and hypercapneic respiratory failure now s/p tracheostomy PMHx: OSA/OHS - Full vent support, implement lung protective strategies - Plateau pressures less than 30 cm H20 - Wean FiO2 & PEEP as tolerated to maintain O2 sats >92% - Follow intermittent Chest X-ray & ABG as needed - SBT vs TCT as tolerated  - TCT during the day as tolerated and nocturnal ventilation  - Implement VAP Bundle - Prn Bronchodilators  MRSA HCAP- completed linezolid x 7 days Provetella sinusitis- completed unasyn/augmentin Persistent low grade fever, suspect due to RUE DVT ~ RESOLVED - Trend WBC and monitor fever curve  - ID consulted, has signed off  Acute renal failure  Hypernatremia~improving  - Trend BMP - Replace electrolytes as indicated -  Monitor UOP - Nephrology consulted appreciate input: HD per recommendations ~ currently no HD indicated (temporary HD catheter has been removed) - continue  free water flushes to 150 q2hrs   DM2 with hyperglycemia - CBG's q4h; Target range of 140 to 180 - SSI & Semglee - Follow ICU Hypo/Hyperglycemia protocol  Ongoing severe toxic encephalopathy- RESOLVED 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  Still uremic.  EEG 8/16 neg.  - Avoid sedating medications as able - PT/OT following - Mobilize as able  PICC associated DVT- PICC removed - Continue eliquis   Depression  - Continue lexapro  Best Practice (right click and "Reselect all SmartList Selections" daily)   Diet/type: NPO, tube feeds  DVT prophylaxis: Eliquis. GI prophylaxis: PPI Lines: Discontinued HD catheter 03/12/22 Foley: N/A Code Status:  full code Last date of multidisciplinary goals of care discussion [03/18/22]  Updated pts daughter Letta Kocher via telephone regarding plan of care and pt prognosis  Critical Care Time: 30 minutes   Donell Beers, [place] Pager [phone] (please enter 7 digits) PCCM Consult Pager [phone] (please enter 7 digits)

## 2022-03-18 NOTE — Progress Notes (Signed)
[facility]  PROGRESS NOTE   Subjective:   Patient seen resting in bed Alert, answering simple questions Denies pain and discomfort Requesting food and drink   Objective:  Vital signs: Blood pressure (!) 155/89, pulse 81, temperature 98.3 F (36.8 C), temperature source Oral, resp. rate 16, height 5\' 9"  (1.753 m), weight (!) 204 kg, SpO2 99 %.  Intake/Output Summary (Last 24 hours) at 03/18/2022 1153 Last data filed at 03/18/2022 1142 Gross per 24 hour  Intake 3531.5 ml  Output 3100 ml  Net 431.5 ml    Filed Weights     Physical Exam: General:  No acute distress, resting comfortably  Head:  Normocephalic, atraumatic. Moist oral mucosal membranes  Eyes:  Anicteric  Lungs:  Scattered rhonchi tracheostomy in place  Heart:  Regular on monitor  Abdomen:   Soft, nontender, bowel sounds present  Extremities: 1+ peripheral edema.  Neurologic: Awake, alert.  Skin:  No lesions  Access:     Basic Metabolic Panel: Recent Labs  Lab 03/14/22 0551 03/15/22 0556 03/16/22 0309 03/17/22 0614 03/18/22 0323  NA 145 149* 150* 150* 147*  K 4.1 4.4 4.1 3.9 3.6  CL 107 111 113* 112* 114*  CO2 29 28 29 29 28   GLUCOSE 98 93 105* 140* 138*  BUN 59* 54* 47* 41* 42*  CREATININE 2.29* 2.02* 1.61* 1.24 1.07  CALCIUM 8.5* 8.4* 8.0* 8.4* 8.1*  MG 2.0 2.0 1.8 1.8 1.8  PHOS 6.1*  6.2* 6.0*  6.0* 4.3 3.6 2.7     CBC: Recent Labs  Lab 03/14/22 0551 03/15/22 0556 03/16/22 0309 03/17/22 0614 03/18/22 0323  WBC 6.6 6.3 6.7 6.5 7.1  NEUTROABS 4.7 4.6 4.6 4.5 4.6  HGB 8.6* 9.1* 8.2* 9.1* 8.2*  HCT 29.7* 32.0* 28.7* 31.8* 29.1*  MCV 91.4 95.0 94.7 95.2 94.2  PLT 208 249 228 274 289      Urinalysis: No results for input(s): "COLORURINE", "LABSPEC", "PHURINE", "GLUCOSEU", "HGBUR", "BILIRUBINUR", "KETONESUR", "PROTEINUR", "UROBILINOGEN", "NITRITE", "LEUKOCYTESUR" in the last 72 hours.  Invalid input(s): "APPERANCEUR"     Imaging: No results  found.   Medications:    sodium chloride 10 mL/hr at 03/14/22 1036   feeding supplement (VITAL 1.5 CAL) 1,000 mL (03/18/22 0651)    apixaban  5 mg Per Tube BID   carvedilol  6.25 mg Per Tube BID WC   Chlorhexidine Gluconate Cloth  6 each Topical Q0600   escitalopram  10 mg Per Tube Daily   feeding supplement (PROSource TF20)  60 mL Per Tube TID   free water  150 mL Per Tube Q2H   insulin aspart  0-20 Units Subcutaneous Q4H   insulin aspart  4 Units Subcutaneous Q4H   insulin detemir  22 Units Subcutaneous BID   ipratropium-albuterol  3 mL Nebulization Q6H   multivitamin  1 tablet Per Tube QHS   mupirocin ointment   Nasal BID   nutrition supplement (JUVEN)  1 packet Per Tube BID BM   mouth rinse  15 mL Mouth Rinse Q2H   pantoprazole  40 mg Per Tube Daily    Assessment/ Plan:     46 y.o. male with medical problems of poorly controlled diabetes, hypertension, systolic CHF, obstructive sleep apnea, obesity    admitted on 02/11/2022 for  Principal Problem:   Acute respiratory failure with hypoxia and hypercarbia (HCC) Active Problems:   Acute on chronic systolic CHF (congestive heart failure) ([place])   Hypertensive urgency   Type 2 diabetes mellitus with peripheral neuropathy ([place])  Acute heart failure with preserved ejection fraction (HCC)   Renal failure (ARF), acute on chronic (HCC)   Hospital-acquired pneumonia   ARDS (adult respiratory distress syndrome) (HCC)   Pressure injury of skin    # Acute kidney injury: Acute kidney injury is most likely secondary to acute tubular necrosis possibly due to hypotension, fever and sepsis.  Patient became oligoanuric with metabolic acidosis and hyperkalemia.   -Renal function within acceptable range with adequate urine output. No need for dialysis. Will continue to monitor  # Hyperphosphatemia -Phosphorus within goal, 2.7.  Continue Renvela powder.  # Hyperkalemia/hypernatremia:   -Potassium stable, 3.6. Sodium  147, improved from  150. Continue scheduled free water.  #Chronic systolic CHF with volume overload 2D echo February 16, 2022-LVEF 40 to 45%, mild concentric LVH moderately enlarged right ventricle, moderately dilated left atrium and right atrium.   #4  Acute hypoxic Vent dependent respiratory failure:  Continues to require vent nightly   #5 Sepsis: Management as per ICU and ID teams. - completed linezolid and Augmentin.      LOS: [facility] 8/29/202311:53 AM

## 2022-03-18 NOTE — Progress Notes (Signed)
Letter for court needs to be faxed to following number with today's date: [phone]

## 2022-03-18 NOTE — Progress Notes (Signed)
9 beat run of Vtach witnessed at bedside by this RN. CCMD verified through phone call with this RN.

## 2022-03-19 DIAGNOSIS — J9601 Acute respiratory failure with hypoxia: Secondary | ICD-10-CM | POA: Diagnosis not present

## 2022-03-19 DIAGNOSIS — J9602 Acute respiratory failure with hypercapnia: Secondary | ICD-10-CM | POA: Diagnosis not present

## 2022-03-19 LAB — CBC WITH DIFFERENTIAL/PLATELET
Abs Immature Granulocytes: 0.02 10*3/uL (ref 0.00–0.07)
Basophils Absolute: 0 10*3/uL (ref 0.0–0.1)
Basophils Relative: 0 %
Eosinophils Absolute: 0.3 10*3/uL (ref 0.0–0.5)
Eosinophils Relative: 5 %
HCT: 29.4 % — ABNORMAL LOW (ref 39.0–52.0)
Hemoglobin: 8.4 g/dL — ABNORMAL LOW (ref 13.0–17.0)
Immature Granulocytes: 0 %
Lymphocytes Relative: 16 %
Lymphs Abs: 1.1 10*3/uL (ref 0.7–4.0)
MCH: 27 pg (ref 26.0–34.0)
MCHC: 28.6 g/dL — ABNORMAL LOW (ref 30.0–36.0)
MCV: 94.5 fL (ref 80.0–100.0)
Monocytes Absolute: 0.8 10*3/uL (ref 0.1–1.0)
Monocytes Relative: 12 %
Neutro Abs: 4.7 10*3/uL (ref 1.7–7.7)
Neutrophils Relative %: 67 %
Platelets: 269 10*3/uL (ref 150–400)
RBC: 3.11 MIL/uL — ABNORMAL LOW (ref 4.22–5.81)
RDW: 16.4 % — ABNORMAL HIGH (ref 11.5–15.5)
WBC: 6.9 10*3/uL (ref 4.0–10.5)
nRBC: 0 % (ref 0.0–0.2)

## 2022-03-19 LAB — MAGNESIUM: Magnesium: 1.5 mg/dL — ABNORMAL LOW (ref 1.7–2.4)

## 2022-03-19 LAB — GLUCOSE, CAPILLARY
Glucose-Capillary: 114 mg/dL — ABNORMAL HIGH (ref 70–99)
Glucose-Capillary: 121 mg/dL — ABNORMAL HIGH (ref 70–99)
Glucose-Capillary: 130 mg/dL — ABNORMAL HIGH (ref 70–99)
Glucose-Capillary: 131 mg/dL — ABNORMAL HIGH (ref 70–99)
Glucose-Capillary: 142 mg/dL — ABNORMAL HIGH (ref 70–99)
Glucose-Capillary: 96 mg/dL (ref 70–99)

## 2022-03-19 LAB — RENAL FUNCTION PANEL
Albumin: 2.4 g/dL — ABNORMAL LOW (ref 3.5–5.0)
Anion gap: 4 — ABNORMAL LOW (ref 5–15)
BUN: 37 mg/dL — ABNORMAL HIGH (ref 6–20)
CO2: 30 mmol/L (ref 22–32)
Calcium: 7.9 mg/dL — ABNORMAL LOW (ref 8.9–10.3)
Chloride: 109 mmol/L (ref 98–111)
Creatinine, Ser: 0.85 mg/dL (ref 0.61–1.24)
GFR, Estimated: >60 mL/min (ref >60)
Glucose, Bld: 146 mg/dL — ABNORMAL HIGH (ref 70–99)
Phosphorus: 2.4 mg/dL — ABNORMAL LOW (ref 2.5–4.6)
Potassium: 3.8 mmol/L (ref 3.5–5.1)
Sodium: 143 mmol/L (ref 135–145)

## 2022-03-19 MED ORDER — FREE WATER
150.0000 mL | Status: DC
  Administered 2022-03-19 – 2022-03-21 (×30): 150 mL

## 2022-03-19 MED ORDER — POTASSIUM & SODIUM PHOSPHATES 280-160-250 MG PO PACK
2.0000 | PACK | Freq: Three times a day (TID) | ORAL | Status: AC
Start: 2022-03-19 — End: 2022-03-19
  Administered 2022-03-19 (×2): 2
  Filled 2022-03-19 (×2): qty 2

## 2022-03-19 MED ORDER — MAGNESIUM SULFATE 2 GM/50ML IV SOLN
2.0000 g | Freq: Once | INTRAVENOUS | Status: AC
  Administered 2022-03-19: 2 g via INTRAVENOUS
  Filled 2022-03-19: qty 50

## 2022-03-19 NOTE — Progress Notes (Signed)
Physical Therapy Treatment Patient Details Name: Samuel Chavez MRN: [id number] DOB: 1975-12-22 Today's Date: 03/19/2022   History of Present Illness Pt is 24 YOM admitted for PNA, renail failure, AKI, hyperkalemia, CHF, Resp failure, sepsis, and is s/p trach placement. PMH includes: DM, HTN, sCHF, OSA, obesity, ARDS Resp failure, sepsis.    PT Comments    Pt presents to PT seated in a recliner w OT present in room. Pt reports significant back pain that is shooting down his L LE and requests to return to bed. Pt presently requires totalA for all mobility activities d/t significant weakness as a result of extended hospital stay. Required totalA for repositioning in bed Following hoyer lift to return to bed, pt reports he is too fatigued and in too much pain to participate in further therapeutic activities at this time. Would benefit from skilled PT to address above deficits and promote optimal return to PLOF.   Recommendations for follow up therapy are one component of a multi-disciplinary discharge planning process, led by the attending physician.  Recommendations may be updated based on patient status, additional functional criteria and insurance authorization.  Follow Up Recommendations  Skilled nursing-short term rehab (<3 hours/day) Can patient physically be transported by private vehicle: No   Assistance Recommended at Discharge Frequent or constant Supervision/Assistance  Patient can return home with the following Two people to help with walking and/or transfers;Two people to help with bathing/dressing/bathroom;Direct supervision/assist for medications management;Help with stairs or ramp for entrance;Assist for transportation;Assistance with feeding;Assistance with cooking/housework;Direct supervision/assist for financial management   Equipment Recommendations  None recommended by PT    Recommendations for Other Services       Precautions / Restrictions Precautions Precautions:  Fall Precaution Comments: contact precautions Restrictions Weight Bearing Restrictions: No Other Position/Activity Restrictions: HOB 30 deg     Mobility  Bed Mobility Overal bed mobility: Needs Assistance   Rolling: Total assist, +2 for physical assistance         General bed mobility comments: +3 TOTAL A for mobility, additional assist for lines mgmt    Transfers                   General transfer comment: pt declined secondary to pain    Ambulation/Gait               General Gait Details: unable to attempt d/t safety/weakness   Stairs             Wheelchair Mobility    Modified Rankin (Stroke Patients Only)       Balance Overall balance assessment: Needs assistance Sitting-balance support: Single extremity supported, Feet supported Sitting balance-Leahy Scale: Poor Sitting balance - Comments: pt seated in a recliner upon pt arrival                                    Cognition Arousal/Alertness: Awake/alert Behavior During Therapy: WFL for tasks assessed/performed Overall Cognitive Status: Within Functional Limits for tasks assessed                                 General Comments: answers appropriately, able to converse more today        Exercises      General Comments        Pertinent Vitals/Pain Pain Assessment Pain Assessment:  (reports significant low back pain that shoots  down his L LE, but does not give NPS rating) Pain Descriptors / Indicators: Grimacing, Shooting Pain Intervention(s): Monitored during session, Repositioned, Limited activity within patient's tolerance    Home Living                          Prior Function            PT Goals (current goals can now be found in the care plan section) Acute Rehab PT Goals PT Goal Formulation: Patient unable to participate in goal setting Progress towards PT goals: Progressing toward goals    Frequency    Min  2X/week      PT Plan Current plan remains appropriate    Co-evaluation PT/OT/SLP Co-Evaluation/Treatment: Yes Reason for Co-Treatment: Complexity of the patient's impairments (multi-system involvement);For patient/therapist safety;To address functional/ADL transfers PT goals addressed during session: Mobility/safety with mobility OT goals addressed during session: ADL's and self-care      AM-PAC PT "6 Clicks" Mobility   Outcome Measure  Help needed turning from your back to your side while in a flat bed without using bedrails?: Total Help needed moving from lying on your back to sitting on the side of a flat bed without using bedrails?: Total Help needed moving to and from a bed to a chair (including a wheelchair)?: Total Help needed standing up from a chair using your arms (e.g., wheelchair or bedside chair)?: Total Help needed to walk in hospital room?: Total Help needed climbing 3-5 steps with a railing? : Total 6 Click Score: 6    End of Session Equipment Utilized During Treatment: Oxygen;Other (comment) (ICU lift equipment) Activity Tolerance: Patient limited by pain Patient left: in bed;with call bell/phone within reach;with nursing/sitter in room   PT Visit Diagnosis: Muscle weakness (generalized) (M62.81);Other abnormalities of gait and mobility (R26.89)     Time: 1350-1415 PT Time Calculation (min) (ACUTE ONLY): 25 min  Charges:                        Glenice Laine MPH, SPT 03/19/22, 3:29 PM

## 2022-03-19 NOTE — Plan of Care (Signed)
NAME:  SALLIE MAKER,  DOB:  11/26/1975, LOS: 40 ADMISSION DATE:  02/11/2022     To Whom It May Concern,  This letter is to confirm and verify that Mr. Ros[place] Ndiaye has been hospitalized in the ICU at [facility] in [place], [place] since July 25th, 2023. Discharge date is to TBD.   If you have any questions, please call [phone]      Corrin Parker, M.D.  [facility]  Medical Director [place] Director [facility] Cardio-Pulmonary Department

## 2022-03-19 NOTE — Consult Note (Signed)
PHARMACY CONSULT NOTE  Pharmacy Consult for Electrolyte Monitoring and Replacement   Recent Labs: Potassium (mmol/L)  Date Value  03/19/2022 3.8   Magnesium (mg/dL)  Date Value  03/19/2022 1.5 (L)   Calcium (mg/dL)  Date Value  03/19/2022 7.9 (L)   Albumin (g/dL)  Date Value  03/19/2022 2.4 (L)   Phosphorus (mg/dL)  Date Value  03/19/2022 2.4 (L)   Sodium (mmol/L)  Date Value  03/19/2022 143   Assessment: Patient is a 46 y/o M with medical history including DM, HTN, pancreatitis, tobacco use disorder, systolic CHF, OSA, DM c/b diabetic neuropathy, lumbar radiculopathy, morbid obesity who is admitted with acute respiratory failure in setting of acute CHF and hypertensive urgency. Pharmacy consulted to assist with electrolyte monitoring and replacement as indicated.  Admission complicated by renal insufficiency requiring renal replacement therapy at one point which has now been discontinued with renal recovery.  Nutrition: tube feeds @70ml /h, FWF 125>125ml q2h, Juven supplement,   Goal of Therapy:  Electrolytes within normal limits  Plan:  Scr 1.24>1.07>0.85; UOP 0.5>0.68ml/k/h [Net I/O -14.7L] Na 004>471>580, 2/2 free water deficit. (See above) Mg 1.5: Replete with MgSO4 2g IV x1 Phos of 2.7>2.4. Replete with PhosNaK 2 packets per tube TID x2doses. Renvela discontinued with renal recovery on 8/29. --Follow-up electrolytes with AM labs tomorrow  Lorna Dibble  03/19/2022 8:51 AM

## 2022-03-19 NOTE — Progress Notes (Addendum)
NAME:  Samuel Chavez, MRN:  [id number], DOB:  06-Jul-1976, LOS: 54 ADMISSION DATE:  02/11/2022  History of Present Illness:  46 y.o male with significant PMH as below who presented to the ED with chief complaints of SOB and worsening bilateral lower extremities edema.   ED Course: In the emergency department, the temperature was 37.3C, the heart rate 99 beats/minute, the blood pressure 187/102 mm Hg, the respiratory rate 20 breaths/minute, and the oxygen saturation 89% on. He was noted to be drowsy but still protecting his airway and responding appropriately.   Pertinent  Medical History   Past Medical History:  Diagnosis Date   Diabetes mellitus without complication ([place])    Hypertension    Pancreatitis     Significant Hospital Events: Including procedures, antibiotic start and stop dates in addition to other pertinent events   7/25: Admitted to hospitalist service w/acute on chronic hypoxic hypercapnic resp. failure requiring BiPAP.Failed BiPAP and intubated. PCCM consulted 7/26: INTUBATED, difficlut to sedate, started [place] 7/27: severe hypoxia, PEEP increased to 15 but decreased back to 10 7/28: Persistent severe hypoxia, ventilator changes made 7/29: Persistent hypoxia, recruitment maneuvers instituted, ventilator changes made, empiric heparin as patient cannot be scanned query PE -02/17/22- patient is severely critically ill with bilateral multifocal pneumonia on sedation and paralysis with mechanical ventilation maximal settings.  He is at cusp of death, we met with daughter today and discussed severity of illness. Unable to perform CT due to severity of critical illness. Met with previous PCCM doc and discussed medical plan.  Patient had recruitment maneuvers last few days without improvement, on heparin gtt for possible PE.  02/18/22- patient continues to require maximal settings on ventilator despite good UOP.  He destaturated to <50% spO2 on MV today required BGV.    02/19/22- patient continues to require 100% FiO2 on maximal setting with high PEEP ladder for possible ARDS.  He was diuresed and on steroids with development of AKI.  His CXR had slight interval improvement on right. We discussed case with vascular surgery regarding possible empiric tPA for PE.  02/20/22- patient was unable to get CT today due to continued severe hypoxemia.  Daughter and other family members at bedside we reviewed case and medical plan.  There seems to be some confusion from family as they had asked me to wake patient up and take off ventilator even though I had explained multiple times that he is at very high risk for death.  They laughed at my comments and seemed to think it was not true. We may still be able to do bronchoscopy but its high risk.  We have reduced IV infusions by switching some medications to OGT route.  02/21/22- patient is weaned to 60%FiO2.  Plan for possible bronch if patient is tolerating.  Family at bedside.  If able to we will obtain more imaging and VQ scan to rule out PE.  02/22/22- Patient weaned to 50% on PRVC.  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 CBC stable , CMP with improved GFR. Monitoring Na, pharmacy and renal following for electrolytes/renal function.  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  Overall marked improvement on ventilator over past 48h. Met with daughter at bedside reviewed medical plan.  02/24/22-Vent requirements continue to slowly improve, currently 40% FiO2 and 14 PEEP.  AKI continues to slowly improve, remains hyperkalemic with potassium of 5.8.  Placed back on  Veltassa, Diurese x1.  Trach scheduled for tomorrow at 1:15 PM 02/25/22- Diurese with Lasix x1 dose. Trach scheduled for today. 02/26/22: Remains mechanically ventilated via newly placed tracheostomy will attempt to wean ventilator settings today.  Perform WUA  02/26/22: Pt remains mechanically  ventilated via tracheostomy vent settings: PEEP 14/FiO2 80% 02/27/22: Fevers, ID consulted. Repeating Tracheal aspirate and UA.  Zosyn started 02/28/22: Persistent fevers, Tracheal aspirate with Staph aureus, start Linezolid.  Worsening Creatinine, considering Lasix gtt given high vent requirements (100% FiO2, 14 peep).  D/c fentanyl gtt and decrease oral benzos/narcotics 03/01/22: worsening anuric renal failure. HD line placed and started on hemodialysis. 03/02/22: Vent support slowly improving. Plan for HD again today. 8/14-8/15: remains febrile, encephalopathic despite HD sessions 8/16 fever curve improved, clots noted in LUE 8/18: continued low grade fever (t max 100.6).  Receiving HD.  Slight improvement in encephalopathy 8/19 off pressors, off sedation, remains on vent  8/20: Afebrile, tolerating PSV 8/21: Remains afebrile, obtain CT Abdomen for IR to evaluate for PEG placement.  Tolerating PSV (15/5).  Consult PT for passive ROM 8/22: Extremely weak, but able to MAE to command, nodding to questions. Tolerating PSV, will trial TC. Deemed not candidate for IR placement of PEG, will have General Surgery evaluate 8/23: No acute events overnight tolerating SBT 10/5 will attempt to transition to TCT today  8/24: No acute events overnight continues to tolerate SBT, will attempt TCT as tolerated  8/25: Tolerating TC during day and vent at night.  Strength improving.  Robotic assisted laparoscopic gastrostomy tube placement by General Surgery, heparin currently on hold for procedure. 8/26: tolerating vent at night, TC during day.  Awaiting Surgery approval to restart Heparin gtt today. Na increased to 149 as unable to use G-tube till tomorrow for free water flushes.  Will start low dose D5W infusion 8/27: Tolerated TC all day and throughout night yesterday, General Surgery approves use of G-tube for meds and feeds.  Transition Heparin gtt to Eliquis.  Resume feeds and free water flushes 8/28: Pt placed  back on ventilator overnight due to hypoxia vent settings SBT 10/10.  Tolerate trach collar and  PMV  8/29: No acute events overnight tolerated mechanical ventilation overnight; Will transition to trach collar  8/30: No significant events overnight. Tolerating TC  Consults:  PCCM Nephrology Vascular Surgery Palliative Care ENT Infectious Disease General Surgery  Procedures:  7/25: Intubation 7/26: PICC triple-lumen, right basilic vein 6/44: Left radial arterial line  8/08: Size 8 proximal XLT Sh[place] inserted 8/12: Right IJ HD catheter placed 8/25: Robotic assisted laparoscopic gastrostomy tube placement by General Surgery  Significant Diagnostic Tests:  7/25  Chest Xray:Chest x-ray showed cardiomegaly and pulmonary vascular congestion without overt pulmonary edema 7/25 Echocardiogram: difficult study, LVEF was estimated at 55 to 60%, dilated LV moderate LVH, grade 1 DD, enlargement of the right ventricle, left atrial size moderately dilated, right atrial size moderately dilated this is consistent with restrictive physiology (echo reading not congruous with prior echoes from [facility]) 7/29: Venous US BLE>>IMPRESSION: No lower extremity DVT. 7/30 Limited echo: LVEF 40 to 45% decreased LV function, no wall motion abnormalities, moderate dilation of the LV concentric LVH, right ventricular size moderately enlarged, right atrial enlargement, this is more consistent with prior [facility] scans 8/4: CT Head>>No acute intracranial abnormality. Paranasal sinusitis. Bilateral complete opacification of the mastoid air cells and inner ears, as can be seen in the setting of otitis media. Correlate with symptoms. 8/4: CT Chest/Abdomen/Pelvis>>Extensive ground-glass, alveolar and patchy dense  infiltrates in both lungs suggesting multifocal pneumonia. Part of this finding may suggest underlying pulmonary edema. Small bilateral pleural effusions are seen. Cardiomegaly. There is  ectasia of the main pulmonary artery suggesting pulmonary arterial hypertension. There is no evidence of intestinal obstruction or pneumoperitoneum. There is no hydronephrosis. Appendix is not dilated. UB diverticula are seen in the colon without signs of focal diverticulitis. Severe degenerative changes are noted at L4-L5 level with significant interval worsening. Findings may be due to severe disc degeneration. If there is clinical suspicion for discitis or osteomyelitis, follow-up MRI may be considered 8/4: Lung V/Q>>Pulmonary embolism absent. 8/15: Venous US BLE>>No evidence of deep venous thrombosis in either lower extremity 8/16: Venous US BLE>>1. Non-occlusive/nearly occlusive thrombus in one of paired right upper arm brachial veins containing the indwelling PICC line and occlusive thrombus in the right axillary vein. The visualized segment of the right subclavian vein is patent. No evidence of left upper extremity DVT.  Micro Data:  7/25: SARS-CoV-2 PCR> negative 7/25: MRSA PCR>> NEG 7/27: Strep pneumoniae Ag>> negative 7/27: Legionella Ag>> negative 7/27: Mycoplasma>> <770 7/27: Sputum>> Strep pneumo,Haemophilus influenzae 7/30 Sputum cult >> negative 8/4: Tracheal aspirate>>negative 8/9: Tracheal aspirate>>MRSA 8/10: RVP>>negative 8/10: Right nasal Wound culture>>STAPHYLOCOCCUS AUREUS, PROVOTELLA 8/10: MRSA PCR>> Positive 8/11: Blood culture 1/4>>Staphylococcus epidermidis MecA/C (suspect contaminant)  Antimicrobials:  Cefepime 7/27 >>7/31 Ceftriaxone 7/31>>8/4 Vancomycin 7/27 >> 7/29, restarted 7/30 (resumed due to fever spike on Maxipime) ? Resistant Strep pneumo >> 7/31 Unasyn 8/14>>8/17 Augmentin 8/17>>8/21 Linezolid 8/11>>8/20  Interim History / Subjective:  As outlined above under significant events   Objective   Blood pressure (!) 142/92, pulse 87, temperature 99.2 F (37.3 C), temperature source Axillary, resp. rate 18, height 5' 9" (1.753 m), weight (!) 204 kg,  SpO2 96 %.    Vent Mode: PSV;CPAP FiO2 (%):  [30 %-35 %] 30 % PEEP:  [5 cmH20] 5 cmH20 Pressure Support:  [10 cmH20] 10 cmH20   Intake/Output Summary (Last 24 hours) at 03/19/2022 0947 Last data filed at 03/19/2022 0602 Gross per 24 hour  Intake 1930.5 ml  Output 3300 ml  Net -1369.5 ml    Filed Weights    Examination: GENERAL: Acute on chronically ill appearing male, awake and alert, on mechanical ventilator via tracheostomy NAD HENT: Atraumatic, normocephalic, neck supple, 8 XLT Tracheostomy dressing clean, dry and intact LUNGS: Clear throughout, no wheezing or rales, even, non labored  CV: NSR, RRR, s1s2, no M/R/G, 2+ generalized edema  ABDOMEN: Obese, soft, nontender, no guarding or rebound tenderness, BS+ x4; PEG tube intact  MUSCULOSKELETAL: Generalized weakness, moves all extremities  NEURO:  Alert, following/MAE to commands, PERRL  Resolved Hospital Problem list   Community-acquired pneumonia: Streptococcus pneumoniae and Haemophilus influenzae ~ TREATED MRSA HCAP ~ TREATED Provetella Sinusitis ~ TREATED  Assessment & Plan:  Acute on chronic hypoxemic and hypercapneic respiratory failure now s/p tracheostomy PMHx: OSA/OHS - Wean FiO2  as tolerated to maintain O2 sats >88% - Follow intermittent Chest X-ray & ABG as needed - TCT during the day as tolerated  - Implement VAP Bundle - Prn Bronchodilators  MRSA HCAP- completed linezolid x 7 days Provetella sinusitis- completed unasyn/augmentin Persistent low grade fever, suspect due to RUE DVT ~ RESOLVED - Trend WBC and monitor fever curve  - ID consulted, has signed off  Acute renal failure  Hypernatremia~improving  - Trend BMP - Replace electrolytes as indicated - Monitor UOP - Nephrology consulted appreciate input: HD per recommendations ~ currently no HD indicated (temporary HD catheter has  been removed) - continue free water flushes to 150 q2hrs   DM2 with hyperglycemia - CBG's q4h; Target range of 140 to  180 - SSI & Semglee - Follow ICU Hypo/Hyperglycemia protocol  Ongoing severe toxic encephalopathy- RESOLVED 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  Still uremic.  EEG 8/16 neg.  - Avoid sedating medications as able - PT/OT following - Mobilize as able  PICC associated DVT- PICC removed - Continue eliquis   Depression  - Continue lexapro  Best Practice (right click and "Reselect all SmartList Selections" daily)   Diet/type: NPO, tube feeds  DVT prophylaxis: Eliquis. GI prophylaxis: PPI Lines: Discontinued HD catheter 03/12/22 Foley: N/A Code Status:  full code Last date of multidisciplinary goals of care discussion [03/18/22]  Updated pts daughter Letta Kocher via telephone regarding plan of care and pt prognosis  Critical Care Time: 30 minutes   Rufina Falco, DNP, CCRN, FNP-C, AGACNP-BC Acute Care & Family Nurse Practitioner  [place] for personal pager PCCM on call pager [phone] until 7 am   ICU ATTENDING ATTESTATION:  Patient seen and examined and relevant ancillary tests reviewed.   I agree with the assessment and plan of care as outlined by Rufina Falco NP.      BP (!) 158/94   Pulse 88   Temp 99.2 F (37.3 C) (Axillary)   Resp 20   Ht 5' 9" (1.753 m)   Wt (!) 204 kg   SpO2 96%   BMI 66.41 kg/m   CBC    Component Value Date/Time   WBC 6.9 03/19/2022 0425   RBC 3.11 (L) 03/19/2022 0425   HGB 8.4 (L) 03/19/2022 0425   HCT 29.4 (L) 03/19/2022 0425   PLT 269 03/19/2022 0425   MCV 94.5 03/19/2022 0425   MCH 27.0 03/19/2022 0425   MCHC 28.6 (L) 03/19/2022 0425   RDW 16.4 (H) 03/19/2022 0425   LYMPHSABS 1.1 03/19/2022 0425   MONOABS 0.8 03/19/2022 0425   EOSABS 0.3 03/19/2022 0425   BASOSABS 0.0 03/19/2022 0425        Latest Ref Rng & Units 03/19/2022    4:25 AM 03/18/2022    3:23 AM 03/17/2022    6:14 AM  BMP  Glucose 70 - 99 mg/dL 146  138   140   BUN 6 - 20 mg/dL 37  42  41   Creatinine 0.61 - 1.24 mg/dL 0.85  1.07  1.24   Sodium 135 - 145 mmol/L 143  147  150   Potassium 3.5 - 5.1 mmol/L 3.8  3.6  3.9   Chloride 98 - 111 mmol/L 109  114  112   CO2 22 - 32 mmol/L _0 Calcium 8.9 - 10.3 mg/dL 7.9  8.1  8.4          Critical Care Time devoted to patient care services described in this note is 45 Total Care Time minutes.    Corrin Parker, M.D.  [facility]  Medical Director [place] Director [facility] Cardio-Pulmonary Department

## 2022-03-19 NOTE — Progress Notes (Signed)
  [facility]  PROGRESS NOTE   Subjective:   Patient continues to make adequate urine Creatinine now within normal range.  Assessment/ Plan:     46 y.o. male with medical problems of poorly controlled diabetes, hypertension, systolic CHF, obstructive sleep apnea, obesity    admitted on 02/11/2022 for  Principal Problem:   Acute respiratory failure with hypoxia and hypercarbia (HCC) Active Problems:   Acute on chronic systolic CHF (congestive heart failure) (HCC)   Hypertensive urgency   Type 2 diabetes mellitus with peripheral neuropathy (HCC)   Acute heart failure with preserved ejection fraction (HCC)   Renal failure (ARF), acute on chronic (HCC)   Hospital-acquired pneumonia   ARDS (adult respiratory distress syndrome) (HCC)   Pressure injury of skin    # Acute kidney injury: Acute kidney injury is most likely secondary to acute tubular necrosis possibly due to hypotension, fever and sepsis.  Patient became oligoanuric with metabolic acidosis and hyperkalemia.   -Renal function within acceptable range with adequate urine output.  No additional need for dialysis at this time.  # Hyperphosphatemia -Corrected, we will stop sevelamer.  # Hyperkalemia/hypernatremia:   -Potassium stable, 3.8.  Sodium 143 today.  #Chronic systolic CHF with volume overload 2D echo February 16, 2022-LVEF 40 to 45%, mild concentric LVH moderately enlarged right ventricle, moderately dilated left atrium and right atrium.   #4  Acute hypoxic Vent dependent respiratory failure:  Continues to require vent nightly   #5 Sepsis: Management as per ICU and ID teams. - completed linezolid and Augmentin.    Due to renal recovery, we will sign off at this time.  Feel free to contact us with further concerns or questions.    LOS: [facility] 8/30/202312:46 PM

## 2022-03-19 NOTE — Progress Notes (Signed)
Patient refused wound care this morning. Education was provided regarding the need for wound care and risk for infection.   Will report off to day shift.

## 2022-03-19 NOTE — Progress Notes (Signed)
Nutrition Follow-up  DOCUMENTATION CODES:   Morbid obesity  INTERVENTION:   Continue Vital 1.5 $RemoveB'@70ml'KqLmdFjQ$ /hr + ProSource TF20- 66ml TID via tube   Free water flushes 13ml q2 hours per MD   Regimen provides 2760kcal/day, 173g/day protein and 1263ml/day of free water (3043ml + free water)   Rena-vit daily via tube    Juven Fruit Punch BID, each serving provides 95kcal and 2.5g of protein (amino acids glutamine and arginine)  NUTRITION DIAGNOSIS:   Inadequate oral intake related to inability to eat (pt sedated and ventilated) as evidenced by NPO status.  Ongoing  GOAL:   Provide needs based on ASPEN/SCCM guidelines  Met with TF  MONITOR:   Vent status, Labs, Weight trends, TF tolerance, Skin, I & O's  REASON FOR ASSESSMENT:   Ventilator    ASSESSMENT:   46 y/o male with h/o COPD, CHF, HTN, DM and OSA who is admitted with CHF exacerbation, CAP and severe ARDS.  -Pt s/p tracheostomy and NGT placement 8/8 -Pt s/p new HD 8/12 -Pt s/p surgically placed 16 French gastric tube 8/25  Reviewed I/O's: -1.4 L x 24 hours and -14.8 L since 03/05/22  UOP: 3.3 L x 24 hours   Case discussed with RN, MD, and during ICU rounds. Pt currently on clear liquids and did well with them. Per RN, pt with improved mood since starting lexapro a few days ago. Pt still requiring vent at night, but on trach collar during the day. Plan to try to keep pt off vent tonight.   Case discussed with SLP; plan for trials of solid food today. Will continue with current TF regimen, as it is not expected that pt will take much PO's in.   Labs reviewed: CBGS: 96-142 (inpatient orders for glycemic control are 0-20 units insulin aspart every 4 hours, 4 units insulin aspart eevery 4 hours, and 22 units insulin detemir BID).    Diet Order:   Diet Order             Diet regular Room service appropriate? Yes with Assist; Fluid consistency: Thin  Diet effective now                   EDUCATION NEEDS:    Not appropriate for education at this time  Skin:  Skin Assessment: Reviewed RN Assessment (Pressure Injury: right ischium)  Last BM:  03/19/22 (type 6)  Height:   Ht Readings from Last 1 Encounters:  02/28/22 $RemoveB'5\' 9"'olxtWOsC$  (1.753 m)    Weight:   Wt Readings from Last 1 Encounters:  03/12/18 (!) 154.2 kg    Ideal Body Weight:  72.7 kg  BMI:  Body mass index is 66.41 kg/m.  Estimated Nutritional Needs:   Kcal:  2700-3000kcal/day  Protein:  182g/day protein  Fluid:  2.0L/day    Loistine Chance, RD, LDN, [place] Registered Dietitian II Certified Diabetes Care and Education Specialist Please refer to [facility] for RD and/or RD on-call/weekend/after hours pager

## 2022-03-19 NOTE — Progress Notes (Signed)
Occupational Therapy Treatment Patient Details Name: Samuel Chavez MRN: [id number] DOB: 1976/03/12 Today's Date: 03/19/2022   History of present illness Pt is 16 YOM admitted for PNA, renail failure, AKI, hyperkalemia, CHF, Resp failure, sepsis, and is s/p trach placement. PMH includes: DM, HTN, sCHF, OSA, obesity, ARDS Resp failure, sepsis.   OT comments  Mr Dowding was seen for OT/PT co-treatment on this date. Upon arrival to room pt reclined in chair, agreeable to session. Pt requires SETUP self-drinking seated in chair, fatigues quickly using nondominant LUE, MIN A to use RUE. Continues to present with edema in R hand limited strength/ROM - reviewed edema strategies. Pt c/o 10/10 back pain sitting in chair, requesting to return to bed. SpO2 mid 90s on 8L trach collar t/o session. MOD A anterior weigth shifting to readjust and offload weight on back with no improvement to pain. RN and NT in to assist with return to bed. Let with all needs in reach. Pt making good progress toward goals, will continue to follow POC. Discharge recommendation remains appropriate.     Recommendations for follow up therapy are one component of a multi-disciplinary discharge planning process, led by the attending physician.  Recommendations may be updated based on patient status, additional functional criteria and insurance authorization.    Follow Up Recommendations  OT at Long-term acute care hospital    Assistance Recommended at Discharge Frequent or constant Supervision/Assistance  Patient can return home with the following  Two people to help with walking and/or transfers;Two people to help with bathing/dressing/bathroom   Equipment Recommendations  Other (comment) (defer)    Recommendations for Other Services      Precautions / Restrictions Precautions Precautions: Fall Precaution Comments: contact precautions Restrictions Weight Bearing Restrictions: No Other Position/Activity Restrictions: HOB  30 deg       Mobility Bed Mobility Overal bed mobility: Needs Assistance Bed Mobility: Rolling Rolling: Total assist, +2 for physical assistance              Transfers                         Balance Overall balance assessment: Needs assistance Sitting-balance support: No upper extremity supported Sitting balance-Leahy Scale: Poor Sitting balance - Comments: MOD A static sitting no UE support in recliner                                   ADL either performed or assessed with clinical judgement   ADL Overall ADL's : Needs assistance/impaired                                       General ADL Comments: SETUP self-drinking seated in chair, fatigues quickly using nondominant LUE, MIN A to use RUE.      Cognition Arousal/Alertness: Awake/alert Behavior During Therapy: WFL for tasks assessed/performed Overall Cognitive Status: Within Functional Limits for tasks assessed                                                     Pertinent Vitals/ Pain       Pain Assessment Pain Assessment: 0-10 Pain Score: 10-Worst pain ever Pain Location:  back Pain Descriptors / Indicators: Grimacing, Shooting, Radiating Pain Intervention(s): Limited activity within patient's tolerance, Repositioned, Patient requesting pain meds-RN notified   Frequency  Min 2X/week        Progress Toward Goals  OT Goals(current goals can now be found in the care plan section)  Progress towards OT goals: Progressing toward goals  Acute Rehab OT Goals Patient Stated Goal: to go home OT Goal Formulation: With patient/family Time For Goal Achievement: 03/26/22 Potential to Achieve Goals: Fair ADL Goals Pt Will Perform Grooming: with min assist;bed level Pt Will Transfer to Toilet: with max assist;with +2 assist Pt/caregiver will Perform Home Exercise Program: Increased ROM;Increased strength;Right Upper extremity;Left upper  extremity;With minimal assist  Plan Discharge plan remains appropriate;Frequency remains appropriate    Co-evaluation    PT/OT/SLP Co-Evaluation/Treatment: Yes Reason for Co-Treatment: Complexity of the patient's impairments (multi-system involvement) PT goals addressed during session: Mobility/safety with mobility OT goals addressed during session: ADL's and self-care      AM-PAC OT "6 Clicks" Daily Activity     Outcome Measure   Help from another person eating meals?: A Lot Help from another person taking care of personal grooming?: A Lot Help from another person toileting, which includes using toliet, bedpan, or urinal?: Total Help from another person bathing (including washing, rinsing, drying)?: Total Help from another person to put on and taking off regular upper body clothing?: A Lot Help from another person to put on and taking off regular lower body clothing?: A Lot 6 Click Score: 10    End of Session    OT Visit Diagnosis: Muscle weakness (generalized) (M62.81);Other abnormalities of gait and mobility (R26.89)   Activity Tolerance Patient limited by pain   Patient Left in bed;with call bell/phone within reach;with nursing/sitter in room   Nurse Communication          Time: 1884-1660 OT Time Calculation (min): 42 min  Charges: OT General Charges $OT Visit: 1 Visit OT Treatments $Self Care/Home Management : 8-22 mins $Therapeutic Activity: 8-22 mins  Dessie Coma, M.S. OTR/L  03/19/22, 4:11 PM  ascom [phone]

## 2022-03-19 NOTE — Progress Notes (Signed)
Speech Language Pathology Treatment: Dysphagia  Patient Details Name: Samuel Chavez MRN: [id number] DOB: [birth year] Today's Date: 03/19/2022 Time: 1000-1115 SLP Time Calculation (min) (ACUTE ONLY): 75 min  Assessment / Plan / Recommendation Clinical Impression  Pt seen for both PMV tx session for toleration of PMV wear/use then assessment of swallowing for toleration of po's to initiate and oral diet -- upgrade to solids w/ current clear liquid diet.  Pt continues on TC wean today off vent support during daytime (since this morning per NSG) w/ nocturnal vent, per RT. Per Rounds, TC trials at night to begin tonight. Pt was alert, more verbal during session vs previous sessions. Still requires encouragement to use his "words" vs nodding to indicate needs/wants.     Explained the use and wear of the PMV again to pt; trach and stoma area inspected; HR 80s, O2 sats upper 99-100%; RR 18-21. FiO2 35% at 8L per chart. PMV placed; RR/O2 sats remained at/close to his baseline throughout sessions today. Pt verbalized goal was still to "sit in a chair still" and to have gingeral and water. Verbalizations were c/b min low volume and decreased breath support limiting speech/responses to mainly the Word-level. Intelligibility adequate at the Word-Phrase levels; min improved from yesterday. Improved breath support today for speech. Practiced automatic speech tasks w/ increased volume as a goal using increased breath support w/ breaks b/t ~1-2 words to replenish breath. With effort, pt was able to achieve increased verbalizations. Vocal quality was gravely, min strained. Encouraged pt to use his breath support/effort to increase volume which he was able to do during verbal engagements. PMV placement was tolerated during session for ~45+ mins w/ no change in ANS. No overt discomfort noted in his respiratory effort -- pt stated it felt "fine" to wear/talk w/ PMV in place. No increased effort noted in respirations during  the expiratory phase; no overt use of accessary muscles or distress was noted in his breathing pattern.  Discussed w/ pt that increasing Stamina for PMV wear/use is best done Slowly. Pt nodded in agreement. Education provided on PMV wear, talking in conversation, use of an incentive spirometer or flutter valve -- per discussion w/ RT, in hopes to continue to improve breath support for communication. RT to f/u.    Pt appears to adequately tolerate PMV placement w/out overt respiratory discomfort or distress; ANS remained adequately stable at his Baseline during wear/use. Suspect the Shiley trach size, #8, XLT is adequate for comfortable PMV wear/use.  Much education was given on PMV use/wear, MUST have Cuff deflation for PMV wear, checking and removing the air from the balloon b/f placing, placing/removing the PMV, and care of the PMV. Discussed that PMV can be worn in increments but must NOT be worn when sleeping, drowsy. Encouraged Rest Breaks at times during the day. Precautions posted at bedside and in chart.  Pt remains w/ a Cuffed trach during his wean at this time. Sticker placed on Cuff line, and in room. NSG made aware. MD updated. ST services will continue to follow for ongoing tx; see POC, goals.      DYSPHAGIA TREATMENT:  Pt seen for ongoing assessment of swallowing. He is able to wear the PMV comfortably now for verbal communication. See session noted above.   NSG reported no noted coughing during clear liquid diet since yesterday. Oral care completed. Pt was positioned w/ head forward more; bed positioned forward -- encouragement for using the towel roll for a more head forward position to reduce risk  for aspiration.  Pt explained general aspiration precautions and agreed verbally/head nodded to the need for following them especially sitting upright for all oral intake, No Talking w/ food and drink in mouth, and wearing PMV for ALL oral intake. Pt assisted w/ feeding d/t weak UEs bilat. Trials  of thin liquids via straw then trials of puree and soft solids given monitoring for small sips/bites slowly. NO overt clinical s/s of aspiration were noted w/ any consistency; No coughing, respiratory status remained calm and unlabored, vocal quality clear b/t trials. O2 sats 99-100%; HR 90; RR 19. Oral phase appeared grossly wfl w/ bolus management; timely A-P transfer and full oral clearing achieved. Noted less tongue forward position when sipping on straw, eating today.      Discussed pt's presentation and tx sessions w/ MD/NSG and RT. MD agreed w/ initiation of Regular diet w/ foods well cut(no salad, grapes currently); thin liquids.    Recommended a Regular diet w/ thin liquids. Recommend strict aspiration precautions; tray setup and positioning assistance Upright for meals. PMV MUST BE PLACED FOR ALL ORAL INTAKE. Monitoring w/ all oral intake by [facility] staff. ST services will continue to f/u w/ pt for toleration of diet next 1-2 days. Education on aspiration precautions. NSG and MD agreed on above. Precautions posted at bedside for PMV wear/use and aspiration precautions; Reflux precautions.      HPI HPI: Pt is 78 YOM admitted for PNA, renail failure, AKI, hyperkalemia, CHF, Resp failure, sepsis, and is s/p trach placement on 02/25/22 -- #8 shiley XLT.   PMH includes: Morbid Obesity, ARDS, type 2 diabetes mellitus, hypertension, pancreatitis, ongoing tobacco abuse, systolic CHF, OSA, type 2 diabetes mellitus and diabetic neuropathy, lumbar radiculopathy.  Admitting DXs: Acute respiratory failure with hypoxia and hypercarbia. Prior to hospital admission, pt was Independent for mobility and ADLs. Pt lives with family, communication limited to yes/no with head nods 2/2 tracheostomy.   CXR on 8/16: mild pulmonary edema.      SLP Plan  Continue with current plan of care      Recommendations for follow up therapy are one component of a multi-disciplinary discharge planning process, led by the attending  physician.  Recommendations may be updated based on patient status, additional functional criteria and insurance authorization.    Recommendations  Diet recommendations: Regular;Thin liquid Liquids provided via: Cup;Straw Medication Administration: Whole meds with puree Supervision: Staff to assist with self feeding;Full supervision/cueing for compensatory strategies Compensations: Minimize environmental distractions;Slow rate;Small sips/bites Postural Changes and/or Swallow Maneuvers: Out of bed for meals;Seated upright 90 degrees;Upright 30-60 min after meal      Patient may use Passy-Muir Speech Valve: During all therapies with supervision;During all waking hours (remove during sleep);During PO intake/meals PMSV Supervision: Intermittent MD: Please consider changing trach tube to :  (not at this time)         General recommendations:  (following) Oral Care Recommendations: Oral care BID;Oral care before and after PO;Staff/trained caregiver to provide oral care Follow Up Recommendations: Acute inpatient rehab (3hours/day) (TBD vs LTACH) Assistance recommended at discharge: Frequent or constant Supervision/Assistance (w/ feeding; donning/doffing PMV) SLP Visit Diagnosis: Dysphagia, unspecified (R13.10);Aphonia (R49.1) (trach) Plan: Continue with current plan of care             Orinda Kenner, [place], [place]; [place] [phone] (ascom) Jaydeen Darley  03/19/2022, 11:34 AM

## 2022-03-19 NOTE — Consult Note (Addendum)
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 No wound present. Bedside RN and NT bathing patient and changing bed linens and are present for and assisting with my assessment. No areas of open skin noted.  Patient will continue to have preventative dressings placed over sacrum and bilateral ITs, be turned and repositioned frequently and remain on a mattress replacement with low air loss feature.  [facility] nursing team will no longer follow, but will remain available to this patient, the nursing and medical teams.  Please re-consult if needed.  Thank you for inviting [place] to participate in this patient's Plan of Care.  Maudie Flakes, MSN, RN, CNS, [place], Serita Grammes, [employer], [employer] phone:  [phone]

## 2022-03-19 NOTE — Plan of Care (Signed)
Pt had unremarkable day. Alert awake and oriented, VSS, Pt being on 8L trach collar all and pt using his Mliss Sax valve most of the day, talking more clearly. Pt passed his swallow eval and see dieticians note. Pt on regular diet now and eating and drinking without any problem.Pt was hoyer lifted to a chair for 2 two hour and he complained of pain to his lower back the whole time. PT/OT work with pt when he was up in chair. Pavulon boot to bilat foot to prevent foot drop and pressure to heel.  Pt will continue on tube feeding until he can eat enough to maintain wound healing and recovery.   Pt daughter Angelica Pou called and was updated.

## 2022-03-20 ENCOUNTER — Inpatient Hospital Stay: Payer: Medicaid Other

## 2022-03-20 LAB — RENAL FUNCTION PANEL
Albumin: 2.5 g/dL — ABNORMAL LOW (ref 3.5–5.0)
Anion gap: 6 (ref 5–15)
BUN: 35 mg/dL — ABNORMAL HIGH (ref 6–20)
CO2: 28 mmol/L (ref 22–32)
Calcium: 8 mg/dL — ABNORMAL LOW (ref 8.9–10.3)
Chloride: 105 mmol/L (ref 98–111)
Creatinine, Ser: 0.78 mg/dL (ref 0.61–1.24)
GFR, Estimated: >60 mL/min (ref >60)
Glucose, Bld: 139 mg/dL — ABNORMAL HIGH (ref 70–99)
Phosphorus: 3.2 mg/dL (ref 2.5–4.6)
Potassium: 4.2 mmol/L (ref 3.5–5.1)
Sodium: 139 mmol/L (ref 135–145)

## 2022-03-20 LAB — CBC WITH DIFFERENTIAL/PLATELET
Abs Immature Granulocytes: 0.02 10*3/uL (ref 0.00–0.07)
Basophils Absolute: 0 10*3/uL (ref 0.0–0.1)
Basophils Relative: 1 %
Eosinophils Absolute: 0.3 10*3/uL (ref 0.0–0.5)
Eosinophils Relative: 4 %
HCT: 28.8 % — ABNORMAL LOW (ref 39.0–52.0)
Hemoglobin: 8.2 g/dL — ABNORMAL LOW (ref 13.0–17.0)
Immature Granulocytes: 0 %
Lymphocytes Relative: 17 %
Lymphs Abs: 1.1 10*3/uL (ref 0.7–4.0)
MCH: 26.9 pg (ref 26.0–34.0)
MCHC: 28.5 g/dL — ABNORMAL LOW (ref 30.0–36.0)
MCV: 94.4 fL (ref 80.0–100.0)
Monocytes Absolute: 0.8 10*3/uL (ref 0.1–1.0)
Monocytes Relative: 11 %
Neutro Abs: 4.4 10*3/uL (ref 1.7–7.7)
Neutrophils Relative %: 67 %
Platelets: 262 10*3/uL (ref 150–400)
RBC: 3.05 MIL/uL — ABNORMAL LOW (ref 4.22–5.81)
RDW: 16 % — ABNORMAL HIGH (ref 11.5–15.5)
WBC: 6.6 10*3/uL (ref 4.0–10.5)
nRBC: 0 % (ref 0.0–0.2)

## 2022-03-20 LAB — MAGNESIUM: Magnesium: 1.6 mg/dL — ABNORMAL LOW (ref 1.7–2.4)

## 2022-03-20 LAB — GLUCOSE, CAPILLARY
Glucose-Capillary: 119 mg/dL — ABNORMAL HIGH (ref 70–99)
Glucose-Capillary: 124 mg/dL — ABNORMAL HIGH (ref 70–99)
Glucose-Capillary: 124 mg/dL — ABNORMAL HIGH (ref 70–99)
Glucose-Capillary: 105 mg/dL — ABNORMAL HIGH (ref 70–99)
Glucose-Capillary: 89 mg/dL (ref 70–99)

## 2022-03-20 MED ORDER — ORAL CARE MOUTH RINSE
15.0000 mL | OROMUCOSAL | Status: DC
  Administered 2022-03-20 – 2022-03-24 (×14): 15 mL via OROMUCOSAL

## 2022-03-20 MED ORDER — LIDOCAINE 5 % EX PTCH
1.0000 | MEDICATED_PATCH | CUTANEOUS | Status: DC
  Administered 2022-03-20 – 2022-04-06 (×17): 1 via TRANSDERMAL
  Filled 2022-03-20 (×19): qty 1

## 2022-03-20 MED ORDER — GABAPENTIN 600 MG PO TABS
600.0000 mg | ORAL_TABLET | Freq: Two times a day (BID) | ORAL | Status: DC
  Administered 2022-03-20 – 2022-03-24 (×10): 600 mg
  Filled 2022-03-20 (×12): qty 1

## 2022-03-20 MED ORDER — ORAL CARE MOUTH RINSE: 15.0000 mL | OROMUCOSAL | Status: DC | PRN

## 2022-03-20 MED ORDER — MAGNESIUM SULFATE 2 GM/50ML IV SOLN
2.0000 g | Freq: Once | INTRAVENOUS | Status: AC
  Administered 2022-03-20: 2 g via INTRAVENOUS
  Filled 2022-03-20: qty 50

## 2022-03-20 MED ORDER — OXYCODONE-ACETAMINOPHEN 5-325 MG PO TABS
1.0000 | ORAL_TABLET | Freq: Four times a day (QID) | ORAL | Status: DC | PRN
  Administered 2022-03-20 – 2022-03-21 (×3): 2
  Administered 2022-03-22: 1
  Administered 2022-03-22 – 2022-03-23 (×5): 2
  Administered 2022-03-24: 1
  Administered 2022-03-24 – 2022-03-25 (×2): 2
  Filled 2022-03-20: qty 2
  Filled 2022-03-20: qty 1
  Filled 2022-03-20 (×11): qty 2

## 2022-03-20 MED ORDER — CYCLOBENZAPRINE HCL 10 MG PO TABS: 10.0000 mg | ORAL_TABLET | Freq: Two times a day (BID) | ORAL | Status: DC | PRN

## 2022-03-20 MED ORDER — CYCLOBENZAPRINE HCL 10 MG PO TABS
10.0000 mg | ORAL_TABLET | Freq: Two times a day (BID) | ORAL | Status: DC | PRN
  Administered 2022-03-20 – 2022-03-22 (×3): 10 mg
  Filled 2022-03-20 (×6): qty 1

## 2022-03-20 NOTE — Consult Note (Signed)
PHARMACY CONSULT NOTE  Pharmacy Consult for Electrolyte Monitoring and Replacement   Recent Labs: Potassium (mmol/L)  Date Value  03/20/2022 4.2   Magnesium (mg/dL)  Date Value  03/20/2022 1.6 (L)   Calcium (mg/dL)  Date Value  03/20/2022 8.0 (L)   Albumin (g/dL)  Date Value  03/20/2022 2.5 (L)   Phosphorus (mg/dL)  Date Value  03/20/2022 3.2   Sodium (mmol/L)  Date Value  03/20/2022 139   Assessment: Patient is a 46 y/o M with medical history including DM, HTN, pancreatitis, tobacco use disorder, systolic CHF, OSA, DM c/b diabetic neuropathy, lumbar radiculopathy, morbid obesity who is admitted with acute respiratory failure in setting of acute CHF and hypertensive urgency. Pharmacy consulted to assist with electrolyte monitoring and replacement as indicated.  Admission complicated by renal insufficiency requiring renal replacement therapy at one point which has now been discontinued with renal recovery.  Nutrition: tube feeds @70ml /h, FWF 125>173ml q2h, Juven supplement,   Goal of Therapy:  Electrolytes within normal limits  Plan:  Scr 1.24>1.07>0.85>0.78; UOP 0.7>0.37ml/k/h [Net I/O -13.8L] Na 150>147>143>139, 2/2 free water deficit. (See above) Mg 1.5>1.6: Will repeat MgSO4 2g IV x1 Phos of 2.4>3.2. WNL today. --Follow-up electrolytes with AM labs tomorrow  Lorna Dibble  03/20/2022 8:34 AM

## 2022-03-20 NOTE — TOC Progression Note (Signed)
Transition of Care [facility]) - Progression Note    Patient Details  Name: Samuel Chavez MRN: [id number] Date of Birth: 1975/11/18  Transition of Care [facility]) CM/SW Contact  Shelbie Hutching, RN Phone Number: 03/20/2022, 11:28 AM  Clinical Narrative:    8/31- patient is progressing well with PT and OT and was able to sit up in the chair yesterday.  RNCM updated Jenn with [facility] that patient progressing well.  She will send updated clinicals and therapy notes to [facility] in [place].    Expected Discharge Plan:  (TBD) Barriers to Discharge: Continued Medical Work up  Expected Discharge Plan and Services Expected Discharge Plan:  (TBD)   Discharge Planning Services: CM Consult   Living arrangements for the past 2 months: Single Family Home                                       Social Determinants of Health (SDOH) Interventions    Readmission Risk Interventions     No data to display

## 2022-03-20 NOTE — Progress Notes (Signed)
Physical Therapy Treatment Patient Details Name: Samuel Chavez MRN: [id number] DOB: 10/29/1975 Today's Date: 03/20/2022   History of Present Illness Pt is 5 YOM admitted for PNA, renail failure, AKI, hyperkalemia, CHF, Resp failure, sepsis, and is s/p prolonged ventilation, trach placement. PMH includes: DM, HTN, sCHF, OSA, obesity, ARDS Resp failure, sepsis.    PT Comments    Spoke with nurse who reports he has requested/needed far more pain meds today for L sciatic pain then he has this entire admission.  He was lethargic but did seem to wake more with increased activity with PT.  Pt was able to do supine LE exercises but needed AAROM with some and did have some fatigue with relatively modest effort with sats down to the low 80s, did increase back to mid 90s relatively quickly with cues for focused breathing and adjustment of trach collar O2 positioning.  Pt hesitant to try sitting due to pain and fatigue but was motivated to try.  We were able to get to sidelying with great effort and heavy assist, initiated transition to sitting but pt was unable to get to/attain sitting and we deferred further mobility efforts. Pt is weak and frustrated with his situation but appears motivated to do as much as he can tolerate to try getting back some function.  Recommendations for follow up therapy are one component of a multi-disciplinary discharge planning process, led by the attending physician.  Recommendations may be updated based on patient status, additional functional criteria and insurance authorization.  Follow Up Recommendations  PT at Long-term acute care hospital Can patient physically be transported by private vehicle: No   Assistance Recommended at Discharge Frequent or constant Supervision/Assistance  Patient can return home with the following Two people to help with walking and/or transfers;Two people to help with bathing/dressing/bathroom;Direct supervision/assist for medications  management;Help with stairs or ramp for entrance;Assist for transportation;Assistance with feeding;Assistance with cooking/housework;Direct supervision/assist for financial management   Equipment Recommendations   (TBD at rehab)    Recommendations for Other Services       Precautions / Restrictions Precautions Precautions: Fall Precaution Comments: contact precautions Restrictions Weight Bearing Restrictions: No     Mobility  Bed Mobility Overal bed mobility: Needs Assistance Bed Mobility: Rolling, Sidelying to Sit Rolling: Max assist, Total assist Sidelying to sit:  (attempted with heavy assist, but pt too weak and hurting (L LE) to be able to achieve sitting position EOB)            Transfers                   General transfer comment: unsafe to attempt    Ambulation/Gait                   Stairs             Wheelchair Mobility    Modified Rankin (Stroke Patients Only)       Balance Overall balance assessment: Needs assistance     Sitting balance - Comments: did not attain sitting EOB position                                    Cognition Arousal/Alertness: Lethargic Behavior During Therapy: WFL for tasks assessed/performed Overall Cognitive Status: Within Functional Limits for tasks assessed  Exercises General Exercises - Lower Extremity Ankle Circles/Pumps: Strengthening, 10 reps Quad Sets: AROM, 10 reps Short Arc Quad: AROM, Strengthening, 10 reps (stronger on R) Heel Slides: AAROM, AROM, Strengthening (R AROM, L AAROM, gentle resisted leg extb/l) Hip ABduction/ADduction: AROM, AAROM, 10 reps (less AAROM required on R) Straight Leg Raises: AAROM, 5 reps    General Comments        Pertinent Vitals/Pain Pain Assessment Pain Assessment: 0-10 Pain Score: 7  Pain Location: L back and L sciatic distribution    Home Living                           Prior Function            PT Goals (current goals can now be found in the care plan section) Progress towards PT goals: Progressing toward goals    Frequency    Min 2X/week      PT Plan Current plan remains appropriate    Co-evaluation              AM-PAC PT "6 Clicks" Mobility   Outcome Measure  Help needed turning from your back to your side while in a flat bed without using bedrails?: Total Help needed moving from lying on your back to sitting on the side of a flat bed without using bedrails?: Total Help needed moving to and from a bed to a chair (including a wheelchair)?: Total Help needed standing up from a chair using your arms (e.g., wheelchair or bedside chair)?: Total Help needed to walk in hospital room?: Total Help needed climbing 3-5 steps with a railing? : Total 6 Click Score: 6    End of Session Equipment Utilized During Treatment: Oxygen Activity Tolerance: Patient limited by pain;Patient limited by fatigue;Patient limited by lethargy Patient left: in bed;with call bell/phone within reach Nurse Communication: Mobility status PT Visit Diagnosis: Muscle weakness (generalized) (M62.81);Other abnormalities of gait and mobility (R26.89)     Time: 2703-5009 PT Time Calculation (min) (ACUTE ONLY): 31 min  Charges:  $Therapeutic Exercise: 8-22 mins $Therapeutic Activity: 8-22 mins                     Kreg Shropshire, DPT  03/20/2022, 5:19 PM

## 2022-03-20 NOTE — Progress Notes (Cosign Needed)
NAME:  AMMAR MOFFATT, MRN:  [id number], DOB:  Sep 08, 1975, LOS: 37 ADMISSION DATE:  02/11/2022  History of Present Illness:  46 y.o male with significant PMH as below who presented to the ED with chief complaints of SOB and worsening bilateral lower extremities edema.   ED Course: In the emergency department, the temperature was 37.3C, the heart rate 99 beats/minute, the blood pressure 187/102 mm Hg, the respiratory rate 20 breaths/minute, and the oxygen saturation 89% on. He was noted to be drowsy but still protecting his airway and responding appropriately.   Pertinent  Medical History   Past Medical History:  Diagnosis Date   Diabetes mellitus without complication ([place])    Hypertension    Pancreatitis     Significant Hospital Events: Including procedures, antibiotic start and stop dates in addition to other pertinent events   7/25: Admitted to hospitalist service w/acute on chronic hypoxic hypercapnic resp. failure requiring BiPAP.Failed BiPAP and intubated. PCCM consulted 7/26: INTUBATED, difficlut to sedate, started [place] 7/27: severe hypoxia, PEEP increased to 15 but decreased back to 10 7/28: Persistent severe hypoxia, ventilator changes made 7/29: Persistent hypoxia, recruitment maneuvers instituted, ventilator changes made, empiric heparin as patient cannot be scanned query PE -02/17/22- patient is severely critically ill with bilateral multifocal pneumonia on sedation and paralysis with mechanical ventilation maximal settings.  He is at cusp of death, we met with daughter today and discussed severity of illness. Unable to perform CT due to severity of critical illness. Met with previous PCCM doc and discussed medical plan.  Patient had recruitment maneuvers last few days without improvement, on heparin gtt for possible PE.  02/18/22- patient continues to require maximal settings on ventilator despite good UOP.  He destaturated to <50% spO2 on MV today required BGV.    02/19/22- patient continues to require 100% FiO2 on maximal setting with high PEEP ladder for possible ARDS.  He was diuresed and on steroids with development of AKI.  His CXR had slight interval improvement on right. We discussed case with vascular surgery regarding possible empiric tPA for PE.  02/20/22- patient was unable to get CT today due to continued severe hypoxemia.  Daughter and other family members at bedside we reviewed case and medical plan.  There seems to be some confusion from family as they had asked me to wake patient up and take off ventilator even though I had explained multiple times that he is at very high risk for death.  They laughed at my comments and seemed to think it was not true. We may still be able to do bronchoscopy but its high risk.  We have reduced IV infusions by switching some medications to OGT route.  02/21/22- patient is weaned to 60%FiO2.  Plan for possible bronch if patient is tolerating.  Family at bedside.  If able to we will obtain more imaging and VQ scan to rule out PE.  02/22/22- Patient weaned to 50% on PRVC.  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 CBC stable , CMP with improved GFR. Monitoring Na, pharmacy and renal following for electrolytes/renal function.  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  Overall marked improvement on ventilator over past 48h. Met with daughter at bedside reviewed medical plan.  02/24/22-Vent requirements continue to slowly improve, currently 40% FiO2 and 14 PEEP.  AKI continues to slowly improve, remains hyperkalemic with potassium of 5.8.  Placed back on  Veltassa, Diurese x1.  Trach scheduled for tomorrow at 1:15 PM 02/25/22- Diurese with Lasix x1 dose. Trach scheduled for today. 02/26/22: Remains mechanically ventilated via newly placed tracheostomy will attempt to wean ventilator settings today.  Perform WUA  02/26/22: Pt remains mechanically  ventilated via tracheostomy vent settings: PEEP 14/FiO2 80% 02/27/22: Fevers, ID consulted. Repeating Tracheal aspirate and UA.  Zosyn started 02/28/22: Persistent fevers, Tracheal aspirate with Staph aureus, start Linezolid.  Worsening Creatinine, considering Lasix gtt given high vent requirements (100% FiO2, 14 peep).  D/c fentanyl gtt and decrease oral benzos/narcotics 03/01/22: worsening anuric renal failure. HD line placed and started on hemodialysis. 03/02/22: Vent support slowly improving. Plan for HD again today. 8/14-8/15: remains febrile, encephalopathic despite HD sessions 8/16 fever curve improved, clots noted in LUE 8/18: continued low grade fever (t max 100.6).  Receiving HD.  Slight improvement in encephalopathy 8/19 off pressors, off sedation, remains on vent  8/20: Afebrile, tolerating PSV 8/21: Remains afebrile, obtain CT Abdomen for IR to evaluate for PEG placement.  Tolerating PSV (15/5).  Consult PT for passive ROM 8/22: Extremely weak, but able to MAE to command, nodding to questions. Tolerating PSV, will trial TC. Deemed not candidate for IR placement of PEG, will have General Surgery evaluate 8/23: No acute events overnight tolerating SBT 10/5 will attempt to transition to TCT today  8/24: No acute events overnight continues to tolerate SBT, will attempt TCT as tolerated  8/25: Tolerating TC during day and vent at night.  Strength improving.  Robotic assisted laparoscopic gastrostomy tube placement by General Surgery, heparin currently on hold for procedure. 8/26: tolerating vent at night, TC during day.  Awaiting Surgery approval to restart Heparin gtt today. Na increased to 149 as unable to use G-tube till tomorrow for free water flushes.  Will start low dose D5W infusion 8/27: Tolerated TC all day and throughout night yesterday, General Surgery approves use of G-tube for meds and feeds.  Transition Heparin gtt to Eliquis.  Resume feeds and free water flushes 8/28: Pt placed  back on ventilator overnight due to hypoxia vent settings SBT 10/10.  Tolerate trach collar and  PMV  8/29: No acute events overnight tolerated mechanical ventilation overnight; Will transition to trach collar  8/30: No significant events overnight. Tolerating TC.  Passed speech evaluation 8/31: Tolerating TC, Passy Muir valve and diet.  Complains of left leg pain, resume home Gabapentin and flexeril, obtain venous US LLE.  Consults:  PCCM Nephrology Vascular Surgery Palliative Care ENT Infectious Disease General Surgery  Procedures:  7/25: Intubation 7/26: PICC triple-lumen, right basilic vein 6/29: Left radial arterial line  8/08: Size 8 proximal XLT Shiley inserted 8/12: Right IJ HD catheter placed 8/25: Robotic assisted laparoscopic gastrostomy tube placement by General Surgery  Significant Diagnostic Tests:  7/25  Chest Xray:Chest x-ray showed cardiomegaly and pulmonary vascular congestion without overt pulmonary edema 7/25 Echocardiogram: difficult study, LVEF was estimated at 55 to 60%, dilated LV moderate LVH, grade 1 DD, enlargement of the right ventricle, left atrial size moderately dilated, right atrial size moderately dilated this is consistent with restrictive physiology (echo reading not congruous with prior echoes from [facility]) 7/29: Venous US BLE>>IMPRESSION: No lower extremity DVT. 7/30 Limited echo: LVEF 40 to 45% decreased LV function, no wall motion abnormalities, moderate dilation of the LV concentric LVH, right ventricular size moderately enlarged, right atrial enlargement, this is more consistent with prior [facility] scans 8/4: CT Head>>No acute intracranial abnormality. Paranasal sinusitis. Bilateral complete opacification of the  mastoid air cells and inner ears, as can be seen in the setting of otitis media. Correlate with symptoms. 8/4: CT Chest/Abdomen/Pelvis>>Extensive ground-glass, alveolar and patchy dense infiltrates in both lungs  suggesting multifocal pneumonia. Part of this finding may suggest underlying pulmonary edema. Small bilateral pleural effusions are seen. Cardiomegaly. There is ectasia of the main pulmonary artery suggesting pulmonary arterial hypertension. There is no evidence of intestinal obstruction or pneumoperitoneum. There is no hydronephrosis. Appendix is not dilated. UB diverticula are seen in the colon without signs of focal diverticulitis. Severe degenerative changes are noted at L4-L5 level with significant interval worsening. Findings may be due to severe disc degeneration. If there is clinical suspicion for discitis or osteomyelitis, follow-up MRI may be considered 8/4: Lung V/Q>>Pulmonary embolism absent. 8/15: Venous US BLE>>No evidence of deep venous thrombosis in either lower extremity 8/16: Venous US BLE>>1. Non-occlusive/nearly occlusive thrombus in one of paired right upper arm brachial veins containing the indwelling PICC line and occlusive thrombus in the right axillary vein. The visualized segment of the right subclavian vein is patent. No evidence of left upper extremity DVT. 8/31: Venous US LLE>>  Micro Data:  7/25: SARS-CoV-2 PCR> negative 7/25: MRSA PCR>> NEG 7/27: Strep pneumoniae Ag>> negative 7/27: Legionella Ag>> negative 7/27: Mycoplasma>> <770 7/27: Sputum>> Strep pneumo,Haemophilus influenzae 7/30 Sputum cult >> negative 8/4: Tracheal aspirate>>negative 8/9: Tracheal aspirate>>MRSA 8/10: RVP>>negative 8/10: Right nasal Wound culture>>STAPHYLOCOCCUS AUREUS, PROVOTELLA 8/10: MRSA PCR>> Positive 8/11: Blood culture 1/4>>Staphylococcus epidermidis MecA/C (suspect contaminant)  Antimicrobials:  Cefepime 7/27 >>7/31 Ceftriaxone 7/31>>8/4 Vancomycin 7/27 >> 7/29, restarted 7/30 (resumed due to fever spike on Maxipime) ? Resistant Strep pneumo >> 7/31 Unasyn 8/14>>8/17 Augmentin 8/17>>8/21 Linezolid 8/11>>8/20  Interim History / Subjective:  As outlined above under  significant events   Objective   Blood pressure (!) 143/81, pulse 83, temperature 98.6 F (37 C), temperature source Oral, resp. rate 18, height _0  (1.753 m), weight (!) 204 kg, SpO2 100 %.    FiO2 (%):  [35 %] 35 %   Intake/Output Summary (Last 24 hours) at 03/20/2022 0920 Last data filed at 03/20/2022 0700 Gross per 24 hour  Intake 3430.84 ml  Output 4001 ml  Net -570.16 ml    Filed Weights    Examination: GENERAL: Acute on chronically ill appearing male, awake and alert, on TC via tracheostomy, NAD HENT: Atraumatic, normocephalic, neck supple, 8 XLT Tracheostomy dressing clean, dry and intact LUNGS: Clear throughout, no wheezing or rales, even, non labored  CV: NSR, RRR, s1s2, no M/R/G, 2+ generalized edema  ABDOMEN: Obese, soft, nontender, no guarding or rebound tenderness, BS+ x4; PEG tube intact  MUSCULOSKELETAL: Generalized weakness, moves all extremities  NEURO:  Alert, following/MAE to commands, PERRL  Resolved Hospital Problem list   Community-acquired pneumonia: Streptococcus pneumoniae and Haemophilus influenzae ~ TREATED MRSA HCAP ~ TREATED Provetella Sinusitis ~ TREATED  Assessment & Plan:  Acute on chronic hypoxemic and hypercapneic respiratory failure now s/p tracheostomy PMHx: OSA/OHS - Full vent support, implement lung protective strategies - Plateau pressures less than 30 cm H20 - Wean FiO2 & PEEP as tolerated to maintain O2 sats >92% - Follow intermittent Chest X-ray & ABG as needed - SBT vs TCT as tolerated  - TCT during the day as tolerated and nocturnal ventilation  - Implement VAP Bundle - Prn Bronchodilators  MRSA HCAP- completed linezolid x 7 days Provetella sinusitis- completed unasyn/augmentin Persistent low grade fever, suspect due to RUE DVT ~ RESOLVED - Trend WBC and monitor fever curve  -  ID consulted, has signed off  Acute renal failure ~ improving Hypernatremia~improving  - Trend BMP - Replace electrolytes as indicated -  Monitor UOP - Nephrology consulted appreciate input: HD per recommendations ~ currently no HD indicated (temporary HD catheter has been removed) - continue free water flushes to 150 q2hrs   DM2 with hyperglycemia - CBG's q4h; Target range of 140 to 180 - SSI & Semglee - Follow ICU Hypo/Hyperglycemia protocol  Ongoing severe toxic encephalopathy- RESOLVED 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  Still uremic.  EEG 8/16 neg.  - Avoid sedating medications as able - PT/OT following - Mobilize as able  PICC associated DVT- PICC removed - Continue eliquis   Depression  - Continue lexapro  Left Leg pain PMHx: Sciatica as reported by pt -Pain control -Resume home gabapentin and flexeril -Obtain Venous US LLE    Best Practice (right click and "Reselect all SmartList Selections" daily)   Diet/type: diet as per speech therapy, tube feeds  DVT prophylaxis: Eliquis. GI prophylaxis: PPI Lines: Discontinued HD catheter 03/12/22 Foley: N/A (external catheter in place) Code Status:  full code Last date of multidisciplinary goals of care discussion [03/20/22]    Critical Care Time: 30 minutes   Darel Hong, AGACNP-BC [place] epic messenger for cross cover needs If after hours, please call E-link

## 2022-03-21 DIAGNOSIS — J9601 Acute respiratory failure with hypoxia: Secondary | ICD-10-CM | POA: Diagnosis not present

## 2022-03-21 DIAGNOSIS — J9602 Acute respiratory failure with hypercapnia: Secondary | ICD-10-CM | POA: Diagnosis not present

## 2022-03-21 LAB — RENAL FUNCTION PANEL
Albumin: 2.7 g/dL — ABNORMAL LOW (ref 3.5–5.0)
Anion gap: 4 — ABNORMAL LOW (ref 5–15)
BUN: 30 mg/dL — ABNORMAL HIGH (ref 6–20)
CO2: 32 mmol/L (ref 22–32)
Calcium: 8.2 mg/dL — ABNORMAL LOW (ref 8.9–10.3)
Chloride: 104 mmol/L (ref 98–111)
Creatinine, Ser: 0.72 mg/dL (ref 0.61–1.24)
GFR, Estimated: >60 mL/min (ref >60)
Glucose, Bld: 102 mg/dL — ABNORMAL HIGH (ref 70–99)
Phosphorus: 3.7 mg/dL (ref 2.5–4.6)
Potassium: 4.9 mmol/L (ref 3.5–5.1)
Sodium: 140 mmol/L (ref 135–145)

## 2022-03-21 LAB — MAGNESIUM: Magnesium: 1.7 mg/dL (ref 1.7–2.4)

## 2022-03-21 LAB — CBC WITH DIFFERENTIAL/PLATELET
Abs Immature Granulocytes: 0.03 10*3/uL (ref 0.00–0.07)
Basophils Absolute: 0.1 10*3/uL (ref 0.0–0.1)
Basophils Relative: 1 %
Eosinophils Absolute: 0.2 10*3/uL (ref 0.0–0.5)
Eosinophils Relative: 4 %
HCT: 30.2 % — ABNORMAL LOW (ref 39.0–52.0)
Hemoglobin: 8.4 g/dL — ABNORMAL LOW (ref 13.0–17.0)
Immature Granulocytes: 1 %
Lymphocytes Relative: 19 %
Lymphs Abs: 1.2 10*3/uL (ref 0.7–4.0)
MCH: 26.7 pg (ref 26.0–34.0)
MCHC: 27.8 g/dL — ABNORMAL LOW (ref 30.0–36.0)
MCV: 95.9 fL (ref 80.0–100.0)
Monocytes Absolute: 0.9 10*3/uL (ref 0.1–1.0)
Monocytes Relative: 14 %
Neutro Abs: 3.9 10*3/uL (ref 1.7–7.7)
Neutrophils Relative %: 61 %
Platelets: 308 10*3/uL (ref 150–400)
RBC: 3.15 MIL/uL — ABNORMAL LOW (ref 4.22–5.81)
RDW: 16.1 % — ABNORMAL HIGH (ref 11.5–15.5)
WBC: 6.4 10*3/uL (ref 4.0–10.5)
nRBC: 0 % (ref 0.0–0.2)

## 2022-03-21 LAB — GLUCOSE, CAPILLARY
Glucose-Capillary: 127 mg/dL — ABNORMAL HIGH (ref 70–99)
Glucose-Capillary: 127 mg/dL — ABNORMAL HIGH (ref 70–99)
Glucose-Capillary: 127 mg/dL — ABNORMAL HIGH (ref 70–99)
Glucose-Capillary: 129 mg/dL — ABNORMAL HIGH (ref 70–99)
Glucose-Capillary: 131 mg/dL — ABNORMAL HIGH (ref 70–99)
Glucose-Capillary: 161 mg/dL — ABNORMAL HIGH (ref 70–99)
Glucose-Capillary: 97 mg/dL (ref 70–99)
Glucose-Capillary: 97 mg/dL (ref 70–99)

## 2022-03-21 MED ORDER — OSMOLITE 1.5 CAL PO LIQD
237.0000 mL | Freq: Every day | ORAL | Status: DC
  Administered 2022-03-22 – 2022-03-23 (×9): 237 mL

## 2022-03-21 MED ORDER — PROSOURCE TF20 ENFIT COMPATIBL EN LIQD
60.0000 mL | Freq: Three times a day (TID) | ENTERAL | Status: DC
  Administered 2022-03-21 – 2022-03-23 (×6): 60 mL
  Filled 2022-03-21 (×7): qty 60

## 2022-03-21 MED ORDER — OSMOLITE 1.5 CAL PO LIQD
120.0000 mL | Freq: Every day | ORAL | Status: AC
  Administered 2022-03-21: 120 mL

## 2022-03-21 MED ORDER — FREE WATER
120.0000 mL | Freq: Every day | Status: DC
  Administered 2022-03-21 – 2022-03-23 (×11): 120 mL

## 2022-03-21 MED ORDER — MAGNESIUM SULFATE 2 GM/50ML IV SOLN
2.0000 g | Freq: Once | INTRAVENOUS | Status: AC
Start: 2022-03-21 — End: 2022-03-21
  Administered 2022-03-21: 2 g via INTRAVENOUS
  Filled 2022-03-21: qty 50

## 2022-03-21 NOTE — Progress Notes (Signed)
Physical Therapy Treatment Patient Details Name: Samuel Chavez MRN: [id number] DOB: [birth year] Today's Date: 03/21/2022   History of Present Illness Pt is 83 YOM admitted for PNA, renail failure, AKI, hyperkalemia, CHF, Resp failure, sepsis, and is s/p prolonged ventilation, trach placement. PMH includes: DM, HTN, sCHF, OSA, obesity, ARDS Resp failure, sepsis.    PT Comments    Pt presents to PT in bed and agreeable to participate in therapy services. Pt was pleasant and motivated to participate during the session and put forth good effort throughout. Pt continuing to require totalA for all functional mobility tasks, but is participating in therapy. Able to maintain static sitting for a total of 8-10 minute, and unsupported at EOB for 1 min. Still demonstrates R lateral lean and requires occasional cues to correct. Able to perform 2 STS initiation attempts today and was able to clear EOB w +3 assistance. Remains limited by LLE sciatic nerve pain, weakness, and general deconditioning d/t extended hospital stay. Would benefit from skilled PT at long term acute care hospital to address above deficits in strength, functional mobility, balance, and activity tolerance to promote optimal return to PLOF.   Recommendations for follow up therapy are one component of a multi-disciplinary discharge planning process, led by the attending physician.  Recommendations may be updated based on patient status, additional functional criteria and insurance authorization.  Follow Up Recommendations  PT at Long-term acute care hospital Can patient physically be transported by private vehicle: No   Assistance Recommended at Discharge Frequent or constant Supervision/Assistance  Patient can return home with the following Two people to help with walking and/or transfers;Two people to help with bathing/dressing/bathroom;Direct supervision/assist for medications management;Help with stairs or ramp for entrance;Assist for  transportation;Assistance with feeding;Assistance with cooking/housework;Direct supervision/assist for financial management   Equipment Recommendations   (TBD at next venue of care)    Recommendations for Other Services       Precautions / Restrictions Precautions Precautions: Fall Precaution Comments: contact precautions Restrictions Weight Bearing Restrictions: No Other Position/Activity Restrictions: HOB 30 deg     Mobility  Bed Mobility Overal bed mobility: Needs Assistance Bed Mobility: Rolling, Supine to Sit, Sit to Supine Rolling: Total assist (3+ for physical assistance)   Supine to sit: Total assist (3+ for physical assistance) Sit to supine: Total assist (+3 total A for physical assistance)   General bed mobility comments: +3 TOTAL A for mobility, additional assist for lines mgmt    Transfers Overall transfer level: Needs assistance   Transfers: Sit to/from Stand Sit to Stand: Total assist           General transfer comment: 3+ for physical assistance    Ambulation/Gait               General Gait Details: unable to attempt d/t safety/weakness   Stairs             Wheelchair Mobility    Modified Rankin (Stroke Patients Only)       Balance Overall balance assessment: Needs assistance Sitting-balance support: No upper extremity supported Sitting balance-Leahy Scale: Fair Sitting balance - Comments: able to attain EOB position. able to maintain sitting unsupported EOB for 1 min Postural control: Right lateral lean                                  Cognition Arousal/Alertness: Awake/alert Behavior During Therapy: WFL for tasks assessed/performed Overall Cognitive Status: Within Functional Limits  for tasks assessed                                 General Comments: answers appropriately, able to converse more today        Exercises      General Comments        Pertinent Vitals/Pain Pain  Assessment Pain Assessment: 0-10 Pain Score: 8  Pain Location: L back and L sciatic distribution Pain Descriptors / Indicators: Grimacing, Shooting, Radiating Pain Intervention(s): Limited activity within patient's tolerance, Monitored during session, Repositioned    Home Living Family/patient expects to be discharged to:: Private residence Living Arrangements: Children;Spouse/significant other Available Help at Discharge: Family                    Prior Function            PT Goals (current goals can now be found in the care plan section) Acute Rehab PT Goals PT Goal Formulation: Patient unable to participate in goal setting Progress towards PT goals: Progressing toward goals    Frequency    Min 2X/week      PT Plan Current plan remains appropriate    Co-evaluation PT/OT/SLP Co-Evaluation/Treatment: Yes Reason for Co-Treatment: To address functional/ADL transfers;For patient/therapist safety;Complexity of the patient's impairments (multi-system involvement) PT goals addressed during session: Mobility/safety with mobility OT goals addressed during session: ADL's and self-care      AM-PAC PT "6 Clicks" Mobility   Outcome Measure  Help needed turning from your back to your side while in a flat bed without using bedrails?: Total Help needed moving from lying on your back to sitting on the side of a flat bed without using bedrails?: Total Help needed moving to and from a bed to a chair (including a wheelchair)?: Total Help needed standing up from a chair using your arms (e.g., wheelchair or bedside chair)?: Total Help needed to walk in hospital room?: Total Help needed climbing 3-5 steps with a railing? : Total 6 Click Score: 6    End of Session Equipment Utilized During Treatment: Oxygen Activity Tolerance: Patient limited by pain Patient left: in bed;with call bell/phone within reach Nurse Communication: Mobility status PT Visit Diagnosis: Muscle weakness  (generalized) (M62.81);Other abnormalities of gait and mobility (R26.89)     Time: 3159-4585 PT Time Calculation (min) (ACUTE ONLY): 27 min  Charges:                       Glenice Laine MPH, SPT 03/21/22, 3:53 PM

## 2022-03-21 NOTE — Consult Note (Signed)
PHARMACY CONSULT NOTE  Pharmacy Consult for Electrolyte Monitoring and Replacement   Recent Labs: Potassium (mmol/L)  Date Value  03/21/2022 4.9   Magnesium (mg/dL)  Date Value  03/21/2022 1.7   Calcium (mg/dL)  Date Value  03/21/2022 8.2 (L)   Albumin (g/dL)  Date Value  03/21/2022 2.7 (L)   Phosphorus (mg/dL)  Date Value  03/21/2022 3.7   Sodium (mmol/L)  Date Value  03/21/2022 140   Assessment: Patient is a 46 y/o M with medical history including DM, HTN, pancreatitis, tobacco use disorder, systolic CHF, OSA, DM c/b diabetic neuropathy, lumbar radiculopathy, morbid obesity who is admitted with acute respiratory failure in setting of acute CHF and hypertensive urgency. Pharmacy consulted to assist with electrolyte monitoring and replacement as indicated.  Admission complicated by renal insufficiency requiring renal replacement therapy at one point which has now been discontinued with renal recovery.  Nutrition: tube feeds @70ml /h, FWF 125>128ml q2h, Juven supplement,   Goal of Therapy:  Electrolytes within normal limits  Plan:  2 grams IV magnesium sulfate x 1 Follow-up electrolytes with AM labs tomorrow  Dallie Piles  03/21/2022 6:57 AM

## 2022-03-21 NOTE — Progress Notes (Signed)
Nutrition Follow Up Note   DOCUMENTATION CODES:   Morbid obesity  INTERVENTION:   Change to Osmolite 1.5 bolus feeds- 1 carton 6 times daily- Flush with 20ml/of water before and after each feed.   ProSource TF 20 TID via tube, each supplement provides 80kcal and 20g of protein.    Regimen provides 2370kcal/day (meets 88% of estimated needs), 150g/day protein (meets 100% of estimated needs) and 1824ml/day of free water   Juven Fruit Punch BID, each serving provides 95kcal and 2.5g of protein (amino acids glutamine and arginine)  NUTRITION DIAGNOSIS:   Inadequate oral intake related to inability to eat (pt sedated and ventilated) as evidenced by NPO status.  GOAL:   Provide needs based on ASPEN/SCCM guidelines -met   MONITOR:   Vent status, Labs, Weight trends, TF tolerance, Skin, I & O's  ASSESSMENT:   46 y/o male with h/o COPD, CHF, HTN, DM and OSA who is admitted with CHF exacerbation, CAP and severe ARDS.  -Pt s/p tracheostomy and NGT placement 8/8 (now removed) -Pt s/p new HD 8/12 (now discontinued)  -Pt s/p surgically placed 16 French gastric tube 8/25  Pt remains on trach collar. G-tube in place and pt is tolerating tube feeds at goal rate. Pt initiated on a regular diet 8/30. Per RN report, pt with poor appetite and oral intake. Pt reports that he is too full. RD will change pt over to bolus feeds to try and increase his oral intake and allow him some time to get hungry. RD will decrease tube feeds to meet ~88% of his estimated needs. Per chart, pt has remained weight stable for several weeks but appears to be up ~73lbs from his UBW. Pt continues to have moderate edema but this is improved. Per RN, pt's buttock wound appears to be healing. Will trial bolus feeds. Plan is for possible LTACH.   Medications reviewed and include: insulin, juven, protonix, Mg sulfate   Labs reviewed: Na 140 wnl, K 4.9 wnl, BUN 30(H), P 3.7 wnl, Mg 1.7 wnl Hgb 8.4(L), Hct 30.2(L) Cbgs- 127,  97, 97 x 24 hrs  MAP- >44mmHg   UOP- 4182ml   Diet Order:   Diet Order             Diet regular Room service appropriate? Yes with Assist; Fluid consistency: Thin  Diet effective now                  EDUCATION NEEDS:   Not appropriate for education at this time  Skin:  Skin Assessment: Reviewed RN Assessment (Pressure Injury: right ischium)  Last BM:  03/19/22 (type 6)  Height:   Ht Readings from Last 1 Encounters:  02/28/22 $RemoveB'5\' 9"'[id number]$  (1.753 m)    Weight:   Wt Readings from Last 1 Encounters:  03/12/18 (!) 154.2 kg    Ideal Body Weight:  72.7 kg  BMI:  Body mass index is 66.41 kg/m.  Estimated Nutritional Needs:   Kcal:  2700-3000kcal/day  Protein:  >150g/day  Fluid:  2.0L/day  Koleen Distance MS, RD, LDN Please refer to [facility] for RD and/or RD on-call/weekend/after hours pager

## 2022-03-21 NOTE — Progress Notes (Signed)
Occupational Therapy Treatment Patient Details Name: Samuel Chavez MRN: [id number] DOB: 1976-03-10 Today's Date: 03/21/2022   History of present illness Pt is 12 YOM admitted for PNA, renail failure, AKI, hyperkalemia, CHF, Resp failure, sepsis, and is s/p prolonged ventilation, trach placement. PMH includes: DM, HTN, sCHF, OSA, obesity, ARDS Resp failure, sepsis.   OT comments  Mr Ransome was seen for OT treatment on this date. Upon arrival to room pt reclined in bed,agreeable to tx. Pt requires MAX A don/doff B socks at bed level. MAX A x3 rolling > sitting EOB. CGA static sitting with BUE support in preparation for seated grooming tasks. Pt cleared rear from EOB during 2 trials with MAX A x2, third person for B knee block. Pt making good progress toward goals, will continue to follow POC. Discharge recommendation remains appropriate.     Recommendations for follow up therapy are one component of a multi-disciplinary discharge planning process, led by the attending physician.  Recommendations may be updated based on patient status, additional functional criteria and insurance authorization.    Follow Up Recommendations  OT at [facility]    Assistance Recommended at Discharge Frequent or constant Supervision/Assistance  Patient can return home with the following  Two people to help with walking and/or transfers;Two people to help with bathing/dressing/bathroom   Equipment Recommendations  Other (comment) (defer)    Recommendations for Other Services      Precautions / Restrictions Precautions Precautions: Fall Precaution Comments: contact precautions Restrictions Weight Bearing Restrictions: No Other Position/Activity Restrictions: HOB 30 deg       Mobility Bed Mobility Overal bed mobility: Needs Assistance Bed Mobility: Rolling, Sidelying to Sit, Sit to Supine Rolling: +2 for physical assistance, Max assist Sidelying to sit: +2 for physical assistance,  Max assist   Sit to supine: +2 for physical assistance, Max assist        Transfers Overall transfer level: Needs assistance     Sit to Stand: Max assist, +2 physical assistance           General transfer comment: ant weight shifting - cleared rear from EOB with MAX A x2, third person for B knee block     Balance Overall balance assessment: Needs assistance Sitting-balance support: Bilateral upper extremity supported, Feet supported Sitting balance-Leahy Scale: Fair                                     ADL either performed or assessed with clinical judgement   ADL Overall ADL's : Needs assistance/impaired                                       General ADL Comments: MAX A don/doff B socks at bed level. CGA static sitting with BUE support in preparation for seated grooming tasks      Cognition Arousal/Alertness: Awake/alert Behavior During Therapy: WFL for tasks assessed/performed, Flat affect Overall Cognitive Status: Within Functional Limits for tasks assessed                                 General Comments: less verbal this date with PMV on, appears sadder        Pertinent Vitals/ Pain       Pain Assessment Pain Assessment: 0-10 Pain  Score: 8  Pain Location: L back and L sciatica Pain Descriptors / Indicators: Grimacing, Shooting, Radiating Pain Intervention(s): Limited activity within patient's tolerance, Repositioned   Frequency  Min 2X/week        Progress Toward Goals  OT Goals(current goals can now be found in the care plan section)  Progress towards OT goals: Progressing toward goals  Acute Rehab OT Goals Patient Stated Goal: to stand and walk OT Goal Formulation: With patient/family Time For Goal Achievement: 03/26/22 Potential to Achieve Goals: Fair ADL Goals Pt Will Perform Grooming: with min assist;bed level Pt Will Transfer to Toilet: with max assist;with +2 assist Pt/caregiver will  Perform Home Exercise Program: Increased ROM;Increased strength;Right Upper extremity;Left upper extremity;With minimal assist  Plan Discharge plan remains appropriate;Frequency remains appropriate    Co-evaluation    PT/OT/SLP Co-Evaluation/Treatment: Yes Reason for Co-Treatment: To address functional/ADL transfers;For patient/therapist safety;Complexity of the patient's impairments (multi-system involvement) PT goals addressed during session: Mobility/safety with mobility OT goals addressed during session: ADL's and self-care      AM-PAC OT "6 Clicks" Daily Activity     Outcome Measure   Help from another person eating meals?: A Little Help from another person taking care of personal grooming?: A Little Help from another person toileting, which includes using toliet, bedpan, or urinal?: A Lot Help from another person bathing (including washing, rinsing, drying)?: A Lot Help from another person to put on and taking off regular upper body clothing?: A Lot Help from another person to put on and taking off regular lower body clothing?: A Lot 6 Click Score: 14    End of Session    OT Visit Diagnosis: Muscle weakness (generalized) (M62.81);Other abnormalities of gait and mobility (R26.89)   Activity Tolerance Patient limited by pain   Patient Left in bed;with call bell/phone within reach   Nurse Communication Mobility status        Time: 1610-9604 OT Time Calculation (min): 27 min  Charges: OT General Charges $OT Visit: 1 Visit OT Treatments $Self Care/Home Management : 8-22 mins  Dessie Coma, M.S. OTR/L  03/21/22, 3:58 PM  ascom [phone]

## 2022-03-21 NOTE — Progress Notes (Signed)
PROGRESS NOTE    Samuel Chavez  RXY:[id number] DOB: 1975-08-26 DOA: 02/11/2022 PCP: [facility]    Brief Narrative:  46 y.o male with significant PMH as below who presented to the ED with chief complaints of SOB and worsening bilateral lower extremities edema.   ED Course: In the emergency department, the temperature was 37.3C, the heart rate 99 beats/minute, the blood pressure 187/102 mm Hg, the respiratory rate 20 breaths/minute, and the oxygen saturation 89% on. He was noted to be drowsy but still protecting his airway and responding appropriately.       ---7/25: Admitted to hospitalist service w/acute on chronic hypoxic hypercapnic resp. failure requiring BiPAP.Failed BiPAP and intubated. PCCM consulted 7/26: INTUBATED, difficlut to sedate, started [place] 7/27: severe hypoxia, PEEP increased to 15 but decreased back to 10 7/28: Persistent severe hypoxia, ventilator changes made 7/29: Persistent hypoxia, recruitment maneuvers instituted, ventilator changes made, empiric heparin as patient cannot be scanned query PE -02/17/22- patient is severely critically ill with bilateral multifocal pneumonia on sedation and paralysis with mechanical ventilation maximal settings.  He is at cusp of death, we met with daughter today and discussed severity of illness. Unable to perform CT due to severity of critical illness. Met with previous PCCM doc and discussed medical plan.  Patient had recruitment maneuvers last few days without improvement, on heparin gtt for possible PE.  02/18/22- patient continues to require maximal settings on ventilator despite good UOP.  He destaturated to <50% spO2 on MV today required BGV.   02/19/22- patient continues to require 100% FiO2 on maximal setting with high PEEP ladder for possible ARDS.  He was diuresed and on steroids with development of AKI.  His CXR had slight interval improvement on right. We discussed case with vascular surgery regarding  possible empiric tPA for PE.  02/20/22- patient was unable to get CT today due to continued severe hypoxemia.  Daughter and other family members at bedside we reviewed case and medical plan.  There seems to be some confusion from family as they had asked me to wake patient up and take off ventilator even though I had explained multiple times that he is at very high risk for death.  They laughed at my comments and seemed to think it was not true. We may still be able to do bronchoscopy but its high risk.  We have reduced IV infusions by switching some medications to OGT route.  02/21/22- patient is weaned to 60%FiO2.  Plan for possible bronch if patient is tolerating.  Family at bedside.  If able to we will obtain more imaging and VQ scan to rule out PE.  02/22/22- Patient weaned to 50% on PRVC.  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 CBC stable , CMP with improved GFR. Monitoring Na, pharmacy and renal following for electrolytes/renal function.  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  Overall marked improvement on ventilator over past 48h. Met with daughter at bedside reviewed medical plan.  02/24/22-Vent requirements continue to slowly improve, currently 40% FiO2 and 14 PEEP.  AKI continues to slowly improve, remains hyperkalemic with potassium of 5.8.  Placed back on [place], Diurese x1.  Trach scheduled for tomorrow at 1:15 PM 02/25/22- Diurese with Lasix x1 dose. Trach scheduled for today. 02/26/22: Remains mechanically ventilated via newly placed tracheostomy will attempt to wean ventilator settings today.  Perform WUA  02/26/22: Pt remains mechanically ventilated  via tracheostomy vent settings: PEEP 14/FiO2 80% 02/27/22: Fevers, ID consulted. Repeating Tracheal aspirate and UA.  Zosyn started 02/28/22: Persistent fevers, Tracheal aspirate with Staph aureus, start Linezolid.  Worsening Creatinine, considering Lasix  gtt given high vent requirements (100% FiO2, 14 peep).  D/c fentanyl gtt and decrease oral benzos/narcotics 03/01/22: worsening anuric renal failure. HD line placed and started on hemodialysis. 03/02/22: Vent support slowly improving. Plan for HD again today. 8/14-8/15: remains febrile, encephalopathic despite HD sessions 8/16 fever curve improved, clots noted in LUE 8/18: continued low grade fever (t max 100.6).  Receiving HD.  Slight improvement in encephalopathy 8/19 off pressors, off sedation, remains on vent  8/20: Afebrile, tolerating PSV 8/21: Remains afebrile, obtain CT Abdomen for IR to evaluate for PEG placement.  Tolerating PSV (15/5).  Consult PT for passive ROM 8/22: Extremely weak, but able to MAE to command, nodding to questions. Tolerating PSV, will trial TC. Deemed not candidate for IR placement of PEG, will have General Surgery evaluate 8/23: No acute events overnight tolerating SBT 10/5 will attempt to transition to TCT today  8/24: No acute events overnight continues to tolerate SBT, will attempt TCT as tolerated  8/25: Tolerating TC during day and vent at night.  Strength improving.  Robotic assisted laparoscopic gastrostomy tube placement by General Surgery, heparin currently on hold for procedure. 8/26: tolerating vent at night, TC during day.  Awaiting Surgery approval to restart Heparin gtt today. Na increased to 149 as unable to use G-tube till tomorrow for free water flushes.  Will start low dose D5W infusion 8/27: Tolerated TC all day and throughout night yesterday, General Surgery approves use of G-tube for meds and feeds.  Transition Heparin gtt to Eliquis.  Resume feeds and free water flushes 8/28: Pt placed back on ventilator overnight due to hypoxia vent settings SBT 10/10.  Tolerate trach collar and  PMV  8/29: No acute events overnight tolerated mechanical ventilation overnight; Will transition to trach collar  8/30: No significant events overnight. Tolerating TC.   Passed speech evaluation 8/31: Tolerating TC, Passy Muir valve and diet.  Complains of left leg pain, resume home Gabapentin and flexeril, obtain venous US LLE. 9/1 TRH pickup      Consultants:  PCCM Nephrology Vascular Surgery Palliative Care ENT Infectious Disease General Surgery     Pccm, nephrology, general surgery  Procedures:  7/25: Intubation 7/26: PICC triple-lumen, right basilic vein 9/98: Left radial arterial line  8/08: Size 8 proximal XLT Shiley inserted 8/12: Right IJ HD catheter placed 8/25: Robotic assisted laparoscopic gastrostomy tube placement by General Surgery    7/25  Chest Xray:Chest x-ray showed cardiomegaly and pulmonary vascular congestion without overt pulmonary edema 7/25 Echocardiogram: difficult study, LVEF was estimated at 55 to 60%, dilated LV moderate LVH, grade 1 DD, enlargement of the right ventricle, left atrial size moderately dilated, right atrial size moderately dilated this is consistent with restrictive physiology (echo reading not congruous with prior echoes from [facility]) 7/29: Venous US BLE>>IMPRESSION: No lower extremity DVT. 7/30 Limited echo: LVEF 40 to 45% decreased LV function, no wall motion abnormalities, moderate dilation of the LV concentric LVH, right ventricular size moderately enlarged, right atrial enlargement, this is more consistent with prior [facility] scans 8/4: CT Head>>No acute intracranial abnormality. Paranasal sinusitis. Bilateral complete opacification of the mastoid air cells and inner ears, as can be seen in the setting of otitis media. Correlate with symptoms. 8/4: CT Chest/Abdomen/Pelvis>>Extensive ground-glass, alveolar and patchy dense infiltrates in both lungs suggesting  multifocal pneumonia. Part of this finding may suggest underlying pulmonary edema. Small bilateral pleural effusions are seen. Cardiomegaly. There is ectasia of the main pulmonary artery suggesting pulmonary arterial  hypertension. There is no evidence of intestinal obstruction or pneumoperitoneum. There is no hydronephrosis. Appendix is not dilated. UB diverticula are seen in the colon without signs of focal diverticulitis. Severe degenerative changes are noted at L4-L5 level with significant interval worsening. Findings may be due to severe disc degeneration. If there is clinical suspicion for discitis or osteomyelitis, follow-up MRI may be considered 8/4: Lung V/Q>>Pulmonary embolism absent. 8/15: Venous US BLE>>No evidence of deep venous thrombosis in either lower extremity 8/16: Venous US BLE>>1. Non-occlusive/nearly occlusive thrombus in one of paired right upper arm brachial veins containing the indwelling PICC line and occlusive thrombus in the right axillary vein. The visualized segment of the right subclavian vein is patent. No evidence of left upper extremity DVT. 8/31: Venous US LLE>>negative      Antimicrobials:  7/25: SARS-CoV-2 PCR> negative 7/25: MRSA PCR>> NEG 7/27: Strep pneumoniae Ag>> negative 7/27: Legionella Ag>> negative 7/27: Mycoplasma>> <770 7/27: Sputum>> Strep pneumo,Haemophilus influenzae 7/30 Sputum cult >> negative 8/4: Tracheal aspirate>>negative 8/9: Tracheal aspirate>>MRSA 8/10: RVP>>negative 8/10: Right nasal Wound culture>>STAPHYLOCOCCUS AUREUS, PROVOTELLA 8/10: MRSA PCR>> Positive 8/11: Blood culture 1/4>>Staphylococcus epidermidis MecA/C (suspect contaminant)     ABX: Cefepime 7/27 >>7/31 Ceftriaxone 7/31>>8/4 Vancomycin 7/27 >> 7/29, restarted 7/30 (resumed due to fever spike on Maxipime) ? Resistant Strep pneumo >> 7/31 Unasyn 8/14>>8/17 Augmentin 8/17>>8/21 Linezolid 8/11>>8/20     Subjective: Pt communicates. Denies worsening sob. C/o about ears muffled, but loud machine noise, and TV on next to him. No  cp  Objective: Vitals:   03/21/22 0300 03/21/22 0400 03/21/22 0500 03/21/22 0600  BP: 104/68 106/74 119/76 120/73  Pulse: 85 88 86 87   Resp: $Remo'14 15 13 13  'hwvGK$ Temp:  98.6 F (37 C)    TempSrc:  Oral    SpO2: 95% 95% 91% 97%  Height:        Intake/Output Summary (Last 24 hours) at 03/21/2022 0914 Last data filed at 03/21/2022 0600 Gross per 24 hour  Intake 3156 ml  Output 4100 ml  Net -944 ml   Filed Weights    Examination:  Calm, NAD Trach collar in place Decrease bs, no wheezing Reg s1/s2 no gallop Soft benign +bs Some swelling Awake and alert  Mood and affect appropriate in current setting     Data Reviewed: I have personally reviewed following labs and imaging studies  CBC: Recent Labs  Lab 03/17/22 0614 03/18/22 0323 03/19/22 0425 03/20/22 0639 03/21/22 0530  WBC 6.5 7.1 6.9 6.6 6.4  NEUTROABS 4.5 4.6 4.7 4.4 3.9  HGB 9.1* 8.2* 8.4* 8.2* 8.4*  HCT 31.8* 29.1* 29.4* 28.8* 30.2*  MCV 95.2 94.2 94.5 94.4 95.9  PLT 274 289 269 262 026   Basic Metabolic Panel: Recent Labs  Lab 03/17/22 0614 03/18/22 0323 03/19/22 0425 03/20/22 0639 03/21/22 0530  NA 150* 147* 143 139 140  K 3.9 3.6 3.8 4.2 4.9  CL 112* 114* 109 105 104  CO2 $Re'29 28 30 28 'SJq$ 32  GLUCOSE 140* 138* 146* 139* 102*  BUN 41* 42* 37* 35* 30*  CREATININE 1.24 1.07 0.85 0.78 0.72  CALCIUM 8.4* 8.1* 7.9* 8.0* 8.2*  MG 1.8 1.8 1.5* 1.6* 1.7  PHOS 3.6 2.7 2.4* 3.2 3.7   GFR: Estimated Creatinine Clearance: 206.7 mL/min (by C-G formula based on SCr of 0.72 mg/dL). Liver Function  Tests: Recent Labs  Lab 03/17/22 [id number] 03/18/22 0323 03/19/22 0425 03/20/22 0639 03/21/22 0530  ALBUMIN 2.6* 2.5* 2.4* 2.5* 2.7*   No results for input(s): "LIPASE", "AMYLASE" in the last 168 hours. No results for input(s): "AMMONIA" in the last 168 hours. Coagulation Profile: No results for input(s): "INR", "PROTIME" in the last 168 hours. Cardiac Enzymes: No results for input(s): "CKTOTAL", "CKMB", "CKMBINDEX", "TROPONINI" in the last 168 hours. BNP (last 3 results) No results for input(s): "PROBNP" in the last 8760 hours. HbA1C: No results for  input(s): "HGBA1C" in the last 72 hours. CBG: Recent Labs  Lab 03/20/22 1513 03/20/22 1959 03/20/22 2351 03/21/22 0351 03/21/22 0722  GLUCAP 105* 89 127* 97 97   Lipid Profile: No results for input(s): "CHOL", "HDL", "LDLCALC", "TRIG", "CHOLHDL", "LDLDIRECT" in the last 72 hours. Thyroid Function Tests: No results for input(s): "TSH", "T4TOTAL", "FREET4", "T3FREE", "THYROIDAB" in the last 72 hours. Anemia Panel: No results for input(s): "VITAMINB12", "FOLATE", "FERRITIN", "TIBC", "IRON", "RETICCTPCT" in the last 72 hours. Sepsis Labs: No results for input(s): "PROCALCITON", "LATICACIDVEN" in the last 168 hours.  No results found for this or any previous visit (from the past 240 hour(s)).       Radiology Studies: DG Knee 1-2 Views Left  Result Date: 03/20/2022 CLINICAL DATA:  Left knee pain. EXAM: LEFT KNEE - 1-2 VIEW COMPARISON:  None Available. FINDINGS: Small joint effusion is present. There is no evidence for acute fracture or dislocation. There is mild medial compartment joint space narrowing. There is tricompartmental osteophyte formation. Soft tissues are within normal limits. IMPRESSION: 1. Small joint effusion. 2. No acute fracture or dislocation. 3. Mild-to-moderate degenerative changes. Electronically Signed   By: Ronney Asters M.D.   On: 03/20/2022 21:06   DG HIP UNILAT WITH PELVIS 2-3 VIEWS LEFT  Result Date: 03/20/2022 CLINICAL DATA:  Left leg pain. EXAM: DG HIP (WITH OR WITHOUT PELVIS) 2-3V LEFT COMPARISON:  None Available. FINDINGS: There is no evidence of hip fracture or dislocation. There are minimal degenerative changes of both hips with sclerosis and osteophyte formation. Soft tissues are within normal limits. IMPRESSION: No acute bony abnormality. Minimal degenerative changes of the hips. Electronically Signed   By: Ronney Asters M.D.   On: 03/20/2022 20:30   US Venous Img Lower Unilateral Left (DVT)  Result Date: 03/20/2022 CLINICAL DATA:  46 year old male  with left leg pain. EXAM: LEFT LOWER EXTREMITY VENOUS DOPPLER ULTRASOUND TECHNIQUE: Gray-scale sonography with graded compression, as well as color Doppler and duplex ultrasound were performed to evaluate the left lower extremity deep venous systems from the level of the common femoral vein and including the common femoral, femoral, profunda femoral, popliteal and calf veins including the posterior tibial, peroneal and gastrocnemius veins when visible. Spectral Doppler was utilized to evaluate flow at rest and with distal augmentation maneuvers in the common femoral, femoral and popliteal veins. The contralateral common femoral vein was also evaluated for comparison. COMPARISON:  None Available. FINDINGS: LEFT LOWER EXTREMITY Common Femoral Vein: No evidence of thrombus. Normal compressibility, respiratory phasicity and response to augmentation. Central Greater Saphenous Vein: No evidence of thrombus. Normal compressibility and flow on color Doppler imaging. Central Profunda Femoral Vein: No evidence of thrombus. Normal compressibility and flow on color Doppler imaging. Femoral Vein: No evidence of thrombus. Normal compressibility, respiratory phasicity and response to augmentation. Popliteal Vein: No evidence of thrombus. Normal compressibility, respiratory phasicity and response to augmentation. Calf Veins: No evidence of thrombus. Normal compressibility and flow on color Doppler imaging. Other  Findings:  None. RIGHT LOWER EXTREMITY Common Femoral Vein: No evidence of thrombus. Normal compressibility, respiratory phasicity and response to augmentation. IMPRESSION: No evidence of left lower extremity deep venous thrombosis. Ruthann Cancer, MD [facility] Electronically Signed   By: Ruthann Cancer M.D.   On: 03/20/2022 15:15        Scheduled Meds:  apixaban  5 mg Per Tube BID   carvedilol  6.25 mg Per Tube BID WC   Chlorhexidine Gluconate Cloth  6 each  Topical Q0600   escitalopram  10 mg Per Tube Daily   feeding supplement (PROSource TF20)  60 mL Per Tube TID   free water  150 mL Per Tube Q2H   gabapentin  600 mg Per Tube BID   insulin aspart  0-20 Units Subcutaneous Q4H   insulin aspart  4 Units Subcutaneous Q4H   insulin detemir  22 Units Subcutaneous BID   ipratropium-albuterol  3 mL Nebulization Q6H   lidocaine  1 patch Transdermal Q24H   multivitamin  1 tablet Per Tube QHS   mupirocin ointment   Nasal BID   nutrition supplement (JUVEN)  1 packet Per Tube BID BM   mouth rinse  15 mL Mouth Rinse 4 times per day   pantoprazole  40 mg Per Tube Daily   Continuous Infusions:  sodium chloride 10 mL/hr at 03/14/22 1036   feeding supplement (VITAL 1.5 CAL) 70 mL/hr at 03/21/22 0600    Assessment & Plan:   Principal Problem:   Acute respiratory failure with hypoxia and hypercarbia (HCC) Active Problems:   Acute on chronic systolic CHF (congestive heart failure) (HCC)   Hypertensive urgency   Type 2 diabetes mellitus with peripheral neuropathy (HCC)   Acute heart failure with preserved ejection fraction (HCC)   Renal failure (ARF), acute on chronic (HCC)   Hospital-acquired pneumonia   ARDS (adult respiratory distress syndrome) (HCC)   Pressure injury of skin    Acute on chronic hypoxemic and hypercapneic respiratory failure now s/p tracheostomy PMHx: OSA/OHS - Full vent support, implement lung protective strategies - Plateau pressures less than 30 cm H20 - Wean FiO2 & PEEP as tolerated to maintain O2 sats >92% - Follow intermittent Chest X-ray & ABG as needed - SBT vs TCT as tolerated  - TCT during the day as tolerated and nocturnal ventilation  - Implement VAP Bundle -9/1 continue inhalers. Pccm following On TC     MRSA HCAP- completed linezolid x 7 days Provetella sinusitis- completed unasyn/augmentin Persistent low grade fever, suspect due to RUE DVT ~  -Resolved Monitor fever curve ID consulted-signed  off    Acute renal failure ~resolved Hypernatremia~resolved - Trend BMP - Replace electrolytes as indicated - Monitor UOP - Nephrology consulted appreciate input: HD per recommendations ~ currently no HD indicated (temporary HD catheter has been removed) 9/1 sodium level normal Continue free water flushes to 150 every 2 hours AKI resolved     DM2 with hyperglycemia - CBG's q4h; Target range of 140 to 180 9/1 BG stable Continue R-ISS      Ongoing severe toxic encephalopathy- RESOLVED 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  Still uremic.  EEG 8/16 neg.  - Avoid sedating medications as able - PT/OT following - Mobilize as able   PICC associated DVT- PICC removed - Continue eliquis    Depression  - Continue lexapro   Left Leg pain PMHx: Sciatica as reported by pt -Pain control -Resume home  gabapentin and flexeril L LE venous ultrasound negative            Resolved hospital problem list:  Community-acquired pneumonia: Streptococcus pneumoniae and Haemophilus influenzae ~ TREATED MRSA HCAP ~ TREATED Provetella Sinusitis ~ TREATED   DVT prophylaxis: Eliquis Code Status: Full full Family Communication: None at bedside Disposition Plan:  Status is: Inpatient Remains inpatient appropriate because: IV treatment.  Complex patient.  Will need long-term acute care        LOS: 38 days   Time spent: 35 min    Nolberto Hanlon, MD [facility] Pager [phone]  If 7PM-7AM, please contact night-coverage 03/21/2022, 9:14 AM

## 2022-03-21 NOTE — Progress Notes (Signed)
Speech Language Pathology Treatment: Dysphagia  Patient Details Name: Samuel Chavez MRN: [id number] DOB: 08/02/1975 Today's Date: 03/21/2022 Time: 6948-5462 SLP Time Calculation (min) (ACUTE ONLY): 55 min  Assessment / Plan / Recommendation Clinical Impression  Pt seen for both PMV tx session for toleration of PMV wear/use then assessment of swallowing for toleration of po's of the Regular diet(upgraded yesterday).  Pt continues on TC wean today off vent support during daytime; night time trials also per MD. Pt was alert, more verbal during session vs previous sessions. Still requires encouragement to use his "words" vs nodding to indicate needs/wants.     Explained the use and wear of the PMV again to pt; trach and stoma area inspected; HR 80s, O2 sats upper 99%; RR 18-23. FiO2 28% at 8L per chart. PMV placed; RR/O2 sats remained at/close to his baseline throughout sessions today. Pt verbalized goal was still to "sit in a chair still" and to have water and apple juice. Verbalizations were c/b min low volume and decreased breath support limiting speech/responses to mainly the Word-level. Intelligibility adequate at the Word-Phrase levels; min improved from yesterday. Improved breath support today for speech. Practiced automatic speech tasks and general conversation re: his family/children w/ increased volume as a goal using increased breath support w/ breaks b/t ~1-3 words to replenish breath support. With effort, pt was able to achieve increased verbalizations. Vocal quality was gravely, min strained. Encouraged pt to use his breath support/effort to increase volume which he was able to do during verbal engagements. PMV placement was tolerated during session for ~45+ mins w/ no change in ANS. No overt discomfort noted in his respiratory effort -- pt stated it felt "fine" to wear/talk w/ PMV in place. No increased effort noted in respirations during the expiratory phase; no overt use of accessary  muscles or distress was noted in his breathing pattern.  Discussed w/ pt that increasing Stamina for PMV wear/use is best done Slowly. Pt nodded in agreement. Education provided on PMV wear, talking in conversation, use of an incentive spirometer or flutter valve -- per discussion w/ RT, in hopes to continue to improve breath support for communication. RT to f/u.    Pt appears to adequately tolerate PMV placement w/out overt respiratory discomfort or distress; ANS remained adequately stable at his Baseline during wear/use. Suspect the Shiley trach size, #8, XLT is adequate for comfortable PMV wear/use.  Much education was given on PMV use/wear, MUST have Cuff deflation for PMV wear, checking and removing the air from the balloon b/f placing, placing/removing the PMV, and care of the PMV. Discussed that PMV can be worn in increments but must NOT be worn when sleeping, drowsy. Encouraged Rest Breaks at times during the day. Precautions posted at bedside and in chart.  Pt remains w/ a Cuffed trach during his wean at this time. Sticker placed on Cuff line, and in room. NSG made aware. MD updated. ST services will continue to follow for ongoing tx; see POC, goals.      DYSPHAGIA TREATMENT:  Pt seen for ongoing assessment of swallowing. He is able to wear the PMV comfortably now for verbal communication. See session noted above.   NSG reported no coughing during meals w/ diet since yesterday. Oral care completed. Pt was positioned w/ head forward more; bed positioned forward -- encouragement for using the towel roll for a more head forward position and sitting forward in bed to reduce risk for aspiration.  Pt explained general aspiration precautions and agreed  verbally/head nodded to the need for following them especially sitting upright for all oral intake, No Talking w/ food and drink in mouth, and wearing PMV for ALL oral intake. Pt assisted w/ feeding d/t weak UEs bilat. Trials of thin liquids via straw then  trials of puree and soft solids given monitoring for small sips/bites slowly. NO overt clinical s/s of aspiration were noted w/ any consistency; No coughing, respiratory status remained calm and unlabored, vocal quality clear b/t trials. O2 sats 99-100%; HR 85-86; RR 19. Oral phase appeared grossly wfl w/ bolus management; timely mastication of solids noted w/ timely A-P transfer and full oral clearing achieved. Noted less tongue forward position when sipping on straw, eating today. Pt denied any difficulty w/ solid foods BUT stated he felt "full" and had less desire for eating but more for drinking liquids. Pt is on continuous NGT TFs at 70 mls/hr. This was discussed w/ Dietician who may adjust the TFs to bolus soon. This could help pt feel hungry at meals w/ increased desire to eat.    Discussed pt's presentation and tx sessions w/ MD/NSG and RT. MD agreed w/ initiation of Regular diet w/ foods well cut(no salad, grapes currently); thin liquids.  NO further skilled ST services needed for dysphagia/swallowing tx. Pt and NSG updated, agreed. Goals met.  Recommended a Regular diet w/ thin liquids. Recommend strict aspiration precautions; tray setup and positioning assistance Upright for meals. PMV MUST BE PLACED FOR ALL ORAL INTAKE. Monitoring w/ all oral intake by [facility] staff. ST services will continue to f/u w/ pt for toleration of diet next 1-2 days. Education on aspiration precautions. NSG and MD agreed on above. Precautions posted at bedside for PMV wear/use and aspiration precautions; Reflux precautions.     HPI HPI: Pt is 42 YOM admitted for PNA, renail failure, AKI, hyperkalemia, CHF, Resp failure, sepsis, and is s/p trach placement on 02/25/22 -- #8 shiley XLT.   PMH includes: Morbid Obesity, ARDS, type 2 diabetes mellitus, hypertension, pancreatitis, ongoing tobacco abuse, systolic CHF, OSA, type 2 diabetes mellitus and diabetic neuropathy, lumbar radiculopathy.  Admitting DXs: Acute respiratory failure  with hypoxia and hypercarbia. Prior to hospital admission, pt was Independent for mobility and ADLs. Pt lives with family, communication limited to yes/no with head nods 2/2 tracheostomy.   CXR on 8/16: mild pulmonary edema.      SLP Plan  All goals met (for swallowing; continue PMV/speech tx)      Recommendations for follow up therapy are one component of a multi-disciplinary discharge planning process, led by the attending physician.  Recommendations may be updated based on patient status, additional functional criteria and insurance authorization.    Recommendations  Diet recommendations: Regular;Thin liquid (cut foods) Liquids provided via: Cup;Straw Medication Administration: Whole meds with puree Supervision: Staff to assist with self feeding;Full supervision/cueing for compensatory strategies Compensations: Minimize environmental distractions;Slow rate;Small sips/bites Postural Changes and/or Swallow Maneuvers: Out of bed for meals;Seated upright 90 degrees;Upright 30-60 min after meal      Patient may use Passy-Muir Speech Valve: During all therapies with supervision;During all waking hours (remove during sleep);During PO intake/meals PMSV Supervision: Intermittent MD: Please consider changing trach tube to :  (not currently)         General recommendations:  (OT/PT following) Oral Care Recommendations: Oral care BID;Oral care before and after PO;Staff/trained caregiver to provide oral care Follow Up Recommendations: Acute inpatient rehab (3hours/day) (vs LTACH TBD) Assistance recommended at discharge: Frequent or constant Supervision/Assistance (w/ feeding; donning/doffing  PMV) SLP Visit Diagnosis: Dysphagia, unspecified (R13.10);Aphonia (R49.1) (tracheostomy) Plan: All goals met (for swallowing; continue PMV/speech tx)            Orinda Kenner, MS, CCC-SLP Speech Language Pathologist Rehab Services; [place] [phone]  (ascom) Watson,Katherine  03/21/2022, 2:52 PM

## 2022-03-22 DIAGNOSIS — J9602 Acute respiratory failure with hypercapnia: Secondary | ICD-10-CM | POA: Diagnosis not present

## 2022-03-22 DIAGNOSIS — J9601 Acute respiratory failure with hypoxia: Secondary | ICD-10-CM | POA: Diagnosis not present

## 2022-03-22 LAB — GLUCOSE, CAPILLARY
Glucose-Capillary: 107 mg/dL — ABNORMAL HIGH (ref 70–99)
Glucose-Capillary: 117 mg/dL — ABNORMAL HIGH (ref 70–99)
Glucose-Capillary: 123 mg/dL — ABNORMAL HIGH (ref 70–99)
Glucose-Capillary: 125 mg/dL — ABNORMAL HIGH (ref 70–99)
Glucose-Capillary: 129 mg/dL — ABNORMAL HIGH (ref 70–99)
Glucose-Capillary: 86 mg/dL (ref 70–99)

## 2022-03-22 LAB — POTASSIUM: Potassium: 5.3 mmol/L — ABNORMAL HIGH (ref 3.5–5.1)

## 2022-03-22 MED ORDER — SODIUM ZIRCONIUM CYCLOSILICATE 5 G PO PACK
10.0000 g | PACK | Freq: Once | ORAL | Status: AC
Start: 2022-03-22 — End: 2022-03-22
  Administered 2022-03-22: 10 g via ORAL
  Filled 2022-03-22: qty 2

## 2022-03-22 NOTE — Progress Notes (Signed)
PROGRESS NOTE    Samuel Chavez  RXY:[id number] DOB: 1975-08-26 DOA: 02/11/2022 PCP: [facility]    Brief Narrative:  46 y.o male with significant PMH as below who presented to the ED with chief complaints of SOB and worsening bilateral lower extremities edema.   ED Course: In the emergency department, the temperature was 37.3C, the heart rate 99 beats/minute, the blood pressure 187/102 mm Hg, the respiratory rate 20 breaths/minute, and the oxygen saturation 89% on. He was noted to be drowsy but still protecting his airway and responding appropriately.       ---7/25: Admitted to hospitalist service w/acute on chronic hypoxic hypercapnic resp. failure requiring BiPAP.Failed BiPAP and intubated. PCCM consulted 7/26: INTUBATED, difficlut to sedate, started [place] 7/27: severe hypoxia, PEEP increased to 15 but decreased back to 10 7/28: Persistent severe hypoxia, ventilator changes made 7/29: Persistent hypoxia, recruitment maneuvers instituted, ventilator changes made, empiric heparin as patient cannot be scanned query PE -02/17/22- patient is severely critically ill with bilateral multifocal pneumonia on sedation and paralysis with mechanical ventilation maximal settings.  He is at cusp of death, we met with daughter today and discussed severity of illness. Unable to perform CT due to severity of critical illness. Met with previous PCCM doc and discussed medical plan.  Patient had recruitment maneuvers last few days without improvement, on heparin gtt for possible PE.  02/18/22- patient continues to require maximal settings on ventilator despite good UOP.  He destaturated to <50% spO2 on MV today required BGV.   02/19/22- patient continues to require 100% FiO2 on maximal setting with high PEEP ladder for possible ARDS.  He was diuresed and on steroids with development of AKI.  His CXR had slight interval improvement on right. We discussed case with vascular surgery regarding  possible empiric tPA for PE.  02/20/22- patient was unable to get CT today due to continued severe hypoxemia.  Daughter and other family members at bedside we reviewed case and medical plan.  There seems to be some confusion from family as they had asked me to wake patient up and take off ventilator even though I had explained multiple times that he is at very high risk for death.  They laughed at my comments and seemed to think it was not true. We may still be able to do bronchoscopy but its high risk.  We have reduced IV infusions by switching some medications to OGT route.  02/21/22- patient is weaned to 60%FiO2.  Plan for possible bronch if patient is tolerating.  Family at bedside.  If able to we will obtain more imaging and VQ scan to rule out PE.  02/22/22- Patient weaned to 50% on PRVC.  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 CBC stable , CMP with improved GFR. Monitoring Na, pharmacy and renal following for electrolytes/renal function.  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  Overall marked improvement on ventilator over past 48h. Met with daughter at bedside reviewed medical plan.  02/24/22-Vent requirements continue to slowly improve, currently 40% FiO2 and 14 PEEP.  AKI continues to slowly improve, remains hyperkalemic with potassium of 5.8.  Placed back on [place], Diurese x1.  Trach scheduled for tomorrow at 1:15 PM 02/25/22- Diurese with Lasix x1 dose. Trach scheduled for today. 02/26/22: Remains mechanically ventilated via newly placed tracheostomy will attempt to wean ventilator settings today.  Perform WUA  02/26/22: Pt remains mechanically ventilated  via tracheostomy vent settings: PEEP 14/FiO2 80% 02/27/22: Fevers, ID consulted. Repeating Tracheal aspirate and UA.  Zosyn started 02/28/22: Persistent fevers, Tracheal aspirate with Staph aureus, start Linezolid.  Worsening Creatinine, considering Lasix  gtt given high vent requirements (100% FiO2, 14 peep).  D/c fentanyl gtt and decrease oral benzos/narcotics 03/01/22: worsening anuric renal failure. HD line placed and started on hemodialysis. 03/02/22: Vent support slowly improving. Plan for HD again today. 8/14-8/15: remains febrile, encephalopathic despite HD sessions 8/16 fever curve improved, clots noted in LUE 8/18: continued low grade fever (t max 100.6).  Receiving HD.  Slight improvement in encephalopathy 8/19 off pressors, off sedation, remains on vent  8/20: Afebrile, tolerating PSV 8/21: Remains afebrile, obtain CT Abdomen for IR to evaluate for PEG placement.  Tolerating PSV (15/5).  Consult PT for passive ROM 8/22: Extremely weak, but able to MAE to command, nodding to questions. Tolerating PSV, will trial TC. Deemed not candidate for IR placement of PEG, will have General Surgery evaluate 8/23: No acute events overnight tolerating SBT 10/5 will attempt to transition to TCT today  8/24: No acute events overnight continues to tolerate SBT, will attempt TCT as tolerated  8/25: Tolerating TC during day and vent at night.  Strength improving.  Robotic assisted laparoscopic gastrostomy tube placement by General Surgery, heparin currently on hold for procedure. 8/26: tolerating vent at night, TC during day.  Awaiting Surgery approval to restart Heparin gtt today. Na increased to 149 as unable to use G-tube till tomorrow for free water flushes.  Will start low dose D5W infusion 8/27: Tolerated TC all day and throughout night yesterday, General Surgery approves use of G-tube for meds and feeds.  Transition Heparin gtt to Eliquis.  Resume feeds and free water flushes 8/28: Pt placed back on ventilator overnight due to hypoxia vent settings SBT 10/10.  Tolerate trach collar and  PMV  8/29: No acute events overnight tolerated mechanical ventilation overnight; Will transition to trach collar  8/30: No significant events overnight. Tolerating TC.   Passed speech evaluation 8/31: Tolerating TC, Passy Muir valve and diet.  Complains of left leg pain, resume home Gabapentin and flexeril, obtain venous US LLE. 9/1 TRH pickup  9/2 no issues today. Feels fine. No sob or cp. K 5.3    Consultants:  PCCM Nephrology Vascular Surgery Palliative Care ENT Infectious Disease General Surgery     Pccm, nephrology, general surgery  Procedures:  7/25: Intubation 7/26: PICC triple-lumen, right basilic vein 1/47: Left radial arterial line  8/08: Size 8 proximal XLT [place] inserted 8/12: Right IJ HD catheter placed 8/25: Robotic assisted laparoscopic gastrostomy tube placement by General Surgery    7/25  Chest Xray:Chest x-ray showed cardiomegaly and pulmonary vascular congestion without overt pulmonary edema 7/25 Echocardiogram: difficult study, LVEF was estimated at 55 to 60%, dilated LV moderate LVH, grade 1 DD, enlargement of the right ventricle, left atrial size moderately dilated, right atrial size moderately dilated this is consistent with restrictive physiology (echo reading not congruous with prior echoes from [facility]) 7/29: Venous US BLE>>IMPRESSION: No lower extremity DVT. 7/30 Limited echo: LVEF 40 to 45% decreased LV function, no wall motion abnormalities, moderate dilation of the LV concentric LVH, right ventricular size moderately enlarged, right atrial enlargement, this is more consistent with prior [facility] scans 8/4: CT Head>>No acute intracranial abnormality. Paranasal sinusitis. Bilateral complete opacification of the mastoid air cells and inner ears, as can be seen in the setting of otitis media. Correlate with symptoms. 8/4: CT  Chest/Abdomen/Pelvis>>Extensive ground-glass, alveolar and patchy dense infiltrates in both lungs suggesting multifocal pneumonia. Part of this finding may suggest underlying pulmonary edema. Small bilateral pleural effusions are seen. Cardiomegaly. There is ectasia of the  main pulmonary artery suggesting pulmonary arterial hypertension. There is no evidence of intestinal obstruction or pneumoperitoneum. There is no hydronephrosis. Appendix is not dilated. UB diverticula are seen in the colon without signs of focal diverticulitis. Severe degenerative changes are noted at L4-L5 level with significant interval worsening. Findings may be due to severe disc degeneration. If there is clinical suspicion for discitis or osteomyelitis, follow-up MRI may be considered 8/4: Lung V/Q>>Pulmonary embolism absent. 8/15: Venous US BLE>>No evidence of deep venous thrombosis in either lower extremity 8/16: Venous US BLE>>1. Non-occlusive/nearly occlusive thrombus in one of paired right upper arm brachial veins containing the indwelling PICC line and occlusive thrombus in the right axillary vein. The visualized segment of the right subclavian vein is patent. No evidence of left upper extremity DVT. 8/31: Venous US LLE>>negative      Antimicrobials:  7/25: SARS-CoV-2 PCR> negative 7/25: MRSA PCR>> NEG 7/27: Strep pneumoniae Ag>> negative 7/27: Legionella Ag>> negative 7/27: Mycoplasma>> <770 7/27: Sputum>> Strep pneumo,Haemophilus influenzae 7/30 Sputum cult >> negative 8/4: Tracheal aspirate>>negative 8/9: Tracheal aspirate>>MRSA 8/10: RVP>>negative 8/10: Right nasal Wound culture>>STAPHYLOCOCCUS AUREUS, PROVOTELLA 8/10: MRSA PCR>> Positive 8/11: Blood culture 1/4>>Staphylococcus epidermidis MecA/C (suspect contaminant)     ABX: Cefepime 7/27 >>7/31 Ceftriaxone 7/31>>8/4 Vancomycin 7/27 >> 7/29, restarted 7/30 (resumed due to fever spike on Maxipime) ? Resistant Strep pneumo >> 7/31 Unasyn 8/14>>8/17 Augmentin 8/17>>8/21 Linezolid 8/11>>8/20     Subjective: Pt communicates. Denies worsening sob. C/o about ears muffled, but loud machine noise, and TV on next to him. No  cp  Objective: Vitals:   03/22/22 0400 03/22/22 0500 03/22/22 0600 03/22/22 0800  BP:  138/88 136/89 122/83 134/83  Pulse: 88 94 96 86  Resp: _0 Temp: 99 F (37.2 C)     TempSrc: Oral   Oral  SpO2: 90% (!) 86% 96% 95%  Height:        Intake/Output Summary (Last 24 hours) at 03/22/2022 0908 Last data filed at 03/22/2022 3570 Gross per 24 hour  Intake 1190 ml  Output 7430 ml  Net -6240 ml   Filed Weights    Examination:  Calm, NAD Coarse bs Reg s1/s2 no gallop Soft benign +bs +edema mild Aaoxox3  Mood and affect appropriate in current setting     Data Reviewed: I have personally reviewed following labs and imaging studies  CBC: Recent Labs  Lab 03/17/22 0614 03/18/22 0323 03/19/22 0425 03/20/22 0639 03/21/22 0530  WBC 6.5 7.1 6.9 6.6 6.4  NEUTROABS 4.5 4.6 4.7 4.4 3.9  HGB 9.1* 8.2* 8.4* 8.2* 8.4*  HCT 31.8* 29.1* 29.4* 28.8* 30.2*  MCV 95.2 94.2 94.5 94.4 95.9  PLT 274 289 269 262 177   Basic Metabolic Panel: Recent Labs  Lab 03/17/22 0614 03/18/22 0323 03/19/22 0425 03/20/22 0639 03/21/22 0530  NA 150* 147* 143 139 140  K 3.9 3.6 3.8 4.2 4.9  CL 112* 114* 109 105 104  CO2 _1 32  GLUCOSE 140* 138* 146* 139* 102*  BUN 41* 42* 37* 35* 30*  CREATININE 1.24 1.07 0.85 0.78 0.72  CALCIUM 8.4* 8.1* 7.9* 8.0* 8.2*  MG 1.8 1.8 1.5* 1.6* 1.7  PHOS 3.6 2.7 2.4* 3.2 3.7   GFR: Estimated Creatinine Clearance: 206.7 mL/min (by C-G formula based on SCr of  0.72 mg/dL). Liver Function Tests: Recent Labs  Lab 03/17/22 3295 03/18/22 0323 03/19/22 0425 03/20/22 0639 03/21/22 0530  ALBUMIN 2.6* 2.5* 2.4* 2.5* 2.7*   No results for input(s): "LIPASE", "AMYLASE" in the last 168 hours. No results for input(s): "AMMONIA" in the last 168 hours. Coagulation Profile: No results for input(s): "INR", "PROTIME" in the last 168 hours. Cardiac Enzymes: No results for input(s): "CKTOTAL", "CKMB", "CKMBINDEX", "TROPONINI" in the last 168 hours. BNP (last 3 results) No results for input(s): "PROBNP" in the last 8760 hours. HbA1C: No  results for input(s): "HGBA1C" in the last 72 hours. CBG: Recent Labs  Lab 03/21/22 1930 03/21/22 2206 03/21/22 2339 03/22/22 0351 03/22/22 0804  GLUCAP 131* 161* 127* 86 123*   Lipid Profile: No results for input(s): "CHOL", "HDL", "LDLCALC", "TRIG", "CHOLHDL", "LDLDIRECT" in the last 72 hours. Thyroid Function Tests: No results for input(s): "TSH", "T4TOTAL", "FREET4", "T3FREE", "THYROIDAB" in the last 72 hours. Anemia Panel: No results for input(s): "VITAMINB12", "FOLATE", "FERRITIN", "TIBC", "IRON", "RETICCTPCT" in the last 72 hours. Sepsis Labs: No results for input(s): "PROCALCITON", "LATICACIDVEN" in the last 168 hours.  No results found for this or any previous visit (from the past 240 hour(s)).       Radiology Studies: DG Knee 1-2 Views Left  Result Date: 03/20/2022 CLINICAL DATA:  Left knee pain. EXAM: LEFT KNEE - 1-2 VIEW COMPARISON:  None Available. FINDINGS: Small joint effusion is present. There is no evidence for acute fracture or dislocation. There is mild medial compartment joint space narrowing. There is tricompartmental osteophyte formation. Soft tissues are within normal limits. IMPRESSION: 1. Small joint effusion. 2. No acute fracture or dislocation. 3. Mild-to-moderate degenerative changes. Electronically Signed   By: Ronney Asters M.D.   On: 03/20/2022 21:06   DG HIP UNILAT WITH PELVIS 2-3 VIEWS LEFT  Result Date: 03/20/2022 CLINICAL DATA:  Left leg pain. EXAM: DG HIP (WITH OR WITHOUT PELVIS) 2-3V LEFT COMPARISON:  None Available. FINDINGS: There is no evidence of hip fracture or dislocation. There are minimal degenerative changes of both hips with sclerosis and osteophyte formation. Soft tissues are within normal limits. IMPRESSION: No acute bony abnormality. Minimal degenerative changes of the hips. Electronically Signed   By: Ronney Asters M.D.   On: 03/20/2022 20:30   US Venous Img Lower Unilateral Left (DVT)  Result Date: 03/20/2022 CLINICAL DATA:   46 year old male with left leg pain. EXAM: LEFT LOWER EXTREMITY VENOUS DOPPLER ULTRASOUND TECHNIQUE: Gray-scale sonography with graded compression, as well as color Doppler and duplex ultrasound were performed to evaluate the left lower extremity deep venous systems from the level of the common femoral vein and including the common femoral, femoral, profunda femoral, popliteal and calf veins including the posterior tibial, peroneal and gastrocnemius veins when visible. Spectral Doppler was utilized to evaluate flow at rest and with distal augmentation maneuvers in the common femoral, femoral and popliteal veins. The contralateral common femoral vein was also evaluated for comparison. COMPARISON:  None Available. FINDINGS: LEFT LOWER EXTREMITY Common Femoral Vein: No evidence of thrombus. Normal compressibility, respiratory phasicity and response to augmentation. Central Greater Saphenous Vein: No evidence of thrombus. Normal compressibility and flow on color Doppler imaging. Central Profunda Femoral Vein: No evidence of thrombus. Normal compressibility and flow on color Doppler imaging. Femoral Vein: No evidence of thrombus. Normal compressibility, respiratory phasicity and response to augmentation. Popliteal Vein: No evidence of thrombus. Normal compressibility, respiratory phasicity and response to augmentation. Calf Veins: No evidence of thrombus. Normal compressibility and flow on  color Doppler imaging. Other Findings:  None. RIGHT LOWER EXTREMITY Common Femoral Vein: No evidence of thrombus. Normal compressibility, respiratory phasicity and response to augmentation. IMPRESSION: No evidence of left lower extremity deep venous thrombosis. Ruthann Cancer, MD [facility] Electronically Signed   By: Ruthann Cancer M.D.   On: 03/20/2022 15:15        Scheduled Meds:  apixaban  5 mg Per Tube BID   carvedilol  6.25 mg Per Tube BID WC   Chlorhexidine  Gluconate Cloth  6 each Topical Q0600   escitalopram  10 mg Per Tube Daily   feeding supplement (OSMOLITE 1.5 CAL)  237 mL Per Tube 6 X Daily   feeding supplement (PROSource TF20)  60 mL Per Tube TID   free water  120 mL Per Tube 6 X Daily   gabapentin  600 mg Per Tube BID   insulin aspart  0-20 Units Subcutaneous Q4H   insulin aspart  4 Units Subcutaneous Q4H   insulin detemir  22 Units Subcutaneous BID   ipratropium-albuterol  3 mL Nebulization Q6H   lidocaine  1 patch Transdermal Q24H   mupirocin ointment   Nasal BID   nutrition supplement (JUVEN)  1 packet Per Tube BID BM   mouth rinse  15 mL Mouth Rinse 4 times per day   pantoprazole  40 mg Per Tube Daily   Continuous Infusions:  sodium chloride 10 mL/hr at 03/14/22 1036    Assessment & Plan:   Principal Problem:   Acute respiratory failure with hypoxia and hypercarbia (HCC) Active Problems:   Acute on chronic systolic CHF (congestive heart failure) (HCC)   Hypertensive urgency   Type 2 diabetes mellitus with peripheral neuropathy (HCC)   Acute heart failure with preserved ejection fraction (HCC)   Renal failure (ARF), acute on chronic (HCC)   Hospital-acquired pneumonia   ARDS (adult respiratory distress syndrome) (HCC)   Pressure injury of skin    Acute on chronic hypoxemic and hypercapneic respiratory failure now s/p tracheostomy PMHx: OSA/OHS - Full vent support, implement lung protective strategies - Plateau pressures less than 30 cm H20 - Wean FiO2 & PEEP as tolerated to maintain O2 sats >92% - Follow intermittent Chest X-ray & ABG as needed - SBT vs TCT as tolerated  - TCT during the day as tolerated and nocturnal ventilation  - Implement VAP Bundle -9/2 continue inhalers. Pccm following     MRSA HCAP- completed linezolid x 7 days Provetella sinusitis- completed unasyn/augmentin Persistent low grade fever, suspect due to RUE DVT ~  -Resolved Monitor fever curve 9/2 ID signed off       Acute  renal failure ~resolved Hypernatremia~resolved - Trend BMP - Replace electrolytes as indicated - Monitor UOP - Nephrology consulted appreciate input: HD per recommendations ~ currently no HD indicated (temporary HD catheter has been removed) 9/1 sodium level normal Continue free water flushes to 150 every 2 hours AKI resolved     DM2 with hyperglycemia - CBG's q4h; Target range of 140 to 180 9/2 BG stable  Continue R-ISS       Ongoing severe toxic encephalopathy- RESOLVED 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  Still uremic.  EEG 8/16 neg.  - Avoid sedating medications as able - PT/OT following - Mobilize as able   PICC associated DVT- PICC removed - Continue eliquis    Depression  - Continue lexapro   Left Leg pain PMHx: Sciatica as reported by pt -  Pain control -Resume home gabapentin and flexeril L LE venous ultrasound negative            Resolved hospital problem list:  Community-acquired pneumonia: Streptococcus pneumoniae and Haemophilus influenzae ~ TREATED MRSA HCAP ~ TREATED Provetella Sinusitis ~ TREATED   DVT prophylaxis: Eliquis Code Status: Full full Family Communication: None at bedside Disposition Plan:  Status is: Inpatient Remains inpatient appropriate because: IV treatment.  Complex patient.  Will need long-term acute care        LOS: 39 days   Time spent: 35 min    Nolberto Hanlon, MD [facility] Pager [phone]  If 7PM-7AM, please contact night-coverage 03/22/2022, 9:08 AM

## 2022-03-22 NOTE — Progress Notes (Signed)
Neuro: A&Ox4, sad regarding situation, requires significant encouragement and reenforcement of goals Resp: stable on trach collar CV: afebrile, vitals signs stable GIGU: purewick in place, no BM but passing gas, tolerating bolus feeds and appetite for eating is increasing Skin: L butt cheek wound improving Social: Daughter at the bedside throughout the day, all questions and concerns addressed  Events: hoyer lift used to move patient from bed to chair and back, well tolerated and seemed to enjoy, felt full relief of pain when hanging

## 2022-03-22 NOTE — Consult Note (Addendum)
PHARMACY CONSULT NOTE  Pharmacy Consult for Electrolyte Monitoring and Replacement   Recent Labs: Potassium (mmol/L)  Date Value  03/22/2022 5.3 (H)   Magnesium (mg/dL)  Date Value  03/21/2022 1.7   Calcium (mg/dL)  Date Value  03/21/2022 8.2 (L)   Albumin (g/dL)  Date Value  03/21/2022 2.7 (L)   Phosphorus (mg/dL)  Date Value  03/21/2022 3.7   Sodium (mmol/L)  Date Value  03/21/2022 140   Assessment: Patient is a 46 y/o M with medical history including DM, HTN, pancreatitis, tobacco use disorder, systolic CHF, OSA, DM c/b diabetic neuropathy, lumbar radiculopathy, morbid obesity who is admitted with acute respiratory failure in setting of acute CHF and hypertensive urgency. Pharmacy consulted to assist with electrolyte monitoring and replacement as indicated.  Admission complicated by renal insufficiency requiring renal replacement therapy at one point which has now been discontinued with renal recovery.  Nutrition: tube feeds @70ml /h, FWF 125>127ml q2h, Juven supplement,   Goal of Therapy:  Electrolytes within normal limits  Plan:  K 5.3-   MD ordered lokelma x 1 Follow-up electrolytes with AM labs tomorrow  Leston Schueller A  03/22/2022 11:43 AM

## 2022-03-22 NOTE — TOC Progression Note (Addendum)
Transition of Care [facility]) - Progression Note    Patient Details  Name: Samuel Chavez MRN: [id number] Date of Birth: 06-10-1976  [facility] of [facility] CM/SW [place], LCSW Phone Number: 03/22/2022, 2:32 PM  Clinical Narrative:    Spoke to [facility] with [facility] who stated she sent updated therapy notes to [facility] in [facility] on Friday morning and is waiting to hear back, will be early next week before she has an update. CSW called patient's daughter Samuel Chavez per her request with update. She inquired why patient could not go to a LTAC in [place]. CSW asked Jenn with [facility]. Jenn stated the [facility] do not take Managed Medicaid.  Attempted to reach [facility], other LTAC in [place]- unable to reach.    Expected Discharge Plan:  (TBD) Barriers to Discharge: Continued Medical Work up  Expected Discharge Plan and Services Expected Discharge Plan:  (TBD)   Discharge Planning Services: CM Consult   Living arrangements for the past 2 months: Single Family Home                                       Social Determinants of Health (SDOH) Interventions    Readmission Risk Interventions     No data to display

## 2022-03-23 DIAGNOSIS — J9601 Acute respiratory failure with hypoxia: Secondary | ICD-10-CM | POA: Diagnosis not present

## 2022-03-23 DIAGNOSIS — J9602 Acute respiratory failure with hypercapnia: Secondary | ICD-10-CM | POA: Diagnosis not present

## 2022-03-23 LAB — GLUCOSE, CAPILLARY
Glucose-Capillary: 171 mg/dL — ABNORMAL HIGH (ref 70–99)
Glucose-Capillary: 88 mg/dL (ref 70–99)
Glucose-Capillary: 97 mg/dL (ref 70–99)
Glucose-Capillary: 133 mg/dL — ABNORMAL HIGH (ref 70–99)
Glucose-Capillary: 118 mg/dL — ABNORMAL HIGH (ref 70–99)
Glucose-Capillary: 124 mg/dL — ABNORMAL HIGH (ref 70–99)
Glucose-Capillary: 123 mg/dL — ABNORMAL HIGH (ref 70–99)

## 2022-03-23 LAB — MAGNESIUM: Magnesium: 1.3 mg/dL — ABNORMAL LOW (ref 1.7–2.4)

## 2022-03-23 LAB — PHOSPHORUS: Phosphorus: 2.5 mg/dL (ref 2.5–4.6)

## 2022-03-23 LAB — BASIC METABOLIC PANEL
Anion gap: 4 — ABNORMAL LOW (ref 5–15)
BUN: 30 mg/dL — ABNORMAL HIGH (ref 6–20)
CO2: 31 mmol/L (ref 22–32)
Calcium: 8.3 mg/dL — ABNORMAL LOW (ref 8.9–10.3)
Chloride: 103 mmol/L (ref 98–111)
Creatinine, Ser: 0.68 mg/dL (ref 0.61–1.24)
GFR, Estimated: >60 mL/min (ref >60)
Glucose, Bld: 128 mg/dL — ABNORMAL HIGH (ref 70–99)
Potassium: 5.3 mmol/L — ABNORMAL HIGH (ref 3.5–5.1)
Sodium: 138 mmol/L (ref 135–145)

## 2022-03-23 LAB — POTASSIUM: Potassium: 4.8 mmol/L (ref 3.5–5.1)

## 2022-03-23 MED ORDER — FREE WATER
30.0000 mL | Status: DC
  Administered 2022-03-24 (×3): 30 mL

## 2022-03-23 MED ORDER — SODIUM ZIRCONIUM CYCLOSILICATE 10 G PO PACK: 10.0000 g | PACK | Freq: Once | ORAL | Status: DC

## 2022-03-23 MED ORDER — MAGNESIUM SULFATE 2 GM/50ML IV SOLN
2.0000 g | Freq: Once | INTRAVENOUS | Status: AC
  Administered 2022-03-23: 2 g via INTRAVENOUS
  Filled 2022-03-23 (×2): qty 50

## 2022-03-23 MED ORDER — OSMOLITE 1.5 CAL PO LIQD
840.0000 mL | ORAL | Status: DC
Start: 2022-03-23 — End: 2022-03-24

## 2022-03-23 MED ORDER — PROSOURCE PLUS PO LIQD
30.0000 mL | Freq: Three times a day (TID) | ORAL | Status: DC
  Administered 2022-03-24 (×2): 30 mL via ORAL
  Filled 2022-03-23: qty 30

## 2022-03-23 MED ORDER — JUVEN PO PACK
1.0000 | PACK | Freq: Two times a day (BID) | ORAL | Status: DC
  Administered 2022-03-24 (×2): 1 via ORAL

## 2022-03-23 MED ORDER — FENTANYL CITRATE PF 50 MCG/ML IJ SOSY
25.0000 ug | PREFILLED_SYRINGE | INTRAMUSCULAR | Status: DC | PRN
  Administered 2022-03-23 – 2022-03-25 (×8): 50 ug via INTRAVENOUS
  Filled 2022-03-23 (×7): qty 1

## 2022-03-23 MED ORDER — SODIUM ZIRCONIUM CYCLOSILICATE 5 G PO PACK
10.0000 g | PACK | Freq: Once | ORAL | Status: DC
Start: 2022-03-23 — End: 2022-03-23

## 2022-03-23 MED ORDER — SODIUM ZIRCONIUM CYCLOSILICATE 10 G PO PACK
10.0000 g | PACK | Freq: Once | ORAL | Status: AC
Start: 2022-03-23 — End: 2022-03-23
  Administered 2022-03-23: 10 g
  Filled 2022-03-23: qty 1

## 2022-03-23 MED ORDER — MAGNESIUM SULFATE 2 GM/50ML IV SOLN
2.0000 g | Freq: Once | INTRAVENOUS | Status: AC
Start: 2022-03-23 — End: 2022-03-23
  Administered 2022-03-23: 2 g via INTRAVENOUS
  Filled 2022-03-23: qty 50

## 2022-03-23 NOTE — Consult Note (Signed)
PHARMACY CONSULT NOTE  Pharmacy Consult for Electrolyte Monitoring and Replacement   Recent Labs: Potassium (mmol/L)  Date Value  03/23/2022 5.3 (H)   Magnesium (mg/dL)  Date Value  03/23/2022 1.3 (L)   Calcium (mg/dL)  Date Value  03/23/2022 8.3 (L)   Albumin (g/dL)  Date Value  03/21/2022 2.7 (L)   Phosphorus (mg/dL)  Date Value  03/23/2022 2.5   Sodium (mmol/L)  Date Value  03/23/2022 138   Assessment: Patient is a 46 y/o M with medical history including DM, HTN, pancreatitis, tobacco use disorder, systolic CHF, OSA, DM c/b diabetic neuropathy, lumbar radiculopathy, morbid obesity who is admitted with acute respiratory failure in setting of acute CHF and hypertensive urgency. Pharmacy consulted to assist with electrolyte monitoring and replacement as indicated.  Admission complicated by renal insufficiency requiring renal replacement therapy at one point which has now been discontinued with renal recovery.  Nutrition: tube feeds @70ml /h, FWF 125>169ml q2h, Juven supplement,   Goal of Therapy:  Electrolytes within normal limits  Plan:  K 5.3  Mag 1.3  Phos 2.5  Scr 0.68    K 5.3 : Will order lokelma 10 gm x 1 again today Mag 1.3:  Will order magnesium sulfate 2 gm IV x 1  F/u K/Mag at 1800 Follow-up electrolytes with AM labs tomorrow  Ginny Loomer A  03/23/2022 10:55 AM

## 2022-03-23 NOTE — Progress Notes (Signed)
Nutrition Follow-up  DOCUMENTATION CODES:   Morbid obesity  INTERVENTION:   -Transition to bolus feedings:   Osmolite 1.5 @ 67ml/hr via PEG  30 ml free water flush every 4 hours to maintain tube patency  Tube feeding regimen provides 1260 kcal (47% of needs), 53 grams of protein, and 638 ml of H2O. Total free water: 818 ml daily  -Transition 1 packet Juven to po BID, each packet provides 95 calories, 2.5 grams of protein (collagen), and 9.8 grams of carbohydrate (3 grams sugar); also contains 7 grams of L-arginine and L-glutamine, 300 mg vitamin C, 15 mg vitamin E, 1.2 mcg vitamin B-12, 9.5 mg zinc, 200 mg calcium, and 1.5 g  Calcium Beta-hydroxy-Beta-methylbutyrate to support wound healing  -30 ml Prosource Plus TID, each supplement provides 100 kcals and 15 grams protein -Double protein portions with meals -Reached out to MD vis secure chat to consider bowel regimen; last documented BM on 03/19/22  NUTRITION DIAGNOSIS:   Inadequate oral intake related to inability to eat (pt sedated and ventilated) as evidenced by NPO status.  Ongoing  GOAL:   Patient will meet greater than or equal to 90% of their needs  Progressing; advanced to PO diet on 03/19/22  MONITOR:   PO intake, Supplement acceptance, TF tolerance  REASON FOR ASSESSMENT:   Ventilator    ASSESSMENT:   46 y/o male with h/o COPD, CHF, HTN, DM and OSA who is admitted with CHF exacerbation, CAP and severe ARDS.  -Pt s/p tracheostomy and NGT placement 8/8 (now removed) -Pt s/p new HD 8/12 (now discontinued)  -Pt s/p surgically placed 16 French gastric tube 8/25 -Advanced to regular diet on 8/30  Reviewed I/O's: -3.1 L x 24 hours and -22.1 L since 03/09/21  UOP: 4.3 L x 24 hours  Case discussed with RN and MD. RN reports pt continues to complain of fullness and inability to administer Prosource, Juven, and Osmolite secondary to fullness. Pt's intake has improved per RN; he is consuming 100% of meals and also  asking for snacks. Documented meal completions 50-60%.   Per RN, pt has not had a bowel movement in 4 days. Last documented bowel movement on 03/19/22. Sent secure chat to Dr.Amery to request bowel regimen.   RD will transition pt to nocturnal feedings; although intake has improved, pt will likely still need some enteral nutrition support at this time due to increased nutritional needs due to debility and would healing. Pt will also transition some supplements PO to assist with tolerance.   Plan to transition to [facility].   Labs reviewed: K: 5.3, CBGS: 88-133 (inpatient orders for glycemic control are 0-20 units insulin aspart every 4 hours, 4 units insulin aspart every 4 hours, and 22 units insulin detemir BID).    Diet Order:   Diet Order             Diet regular Room service appropriate? Yes with Assist; Fluid consistency: Thin  Diet effective now                   EDUCATION NEEDS:   Not appropriate for education at this time  Skin:  Skin Assessment: Skin Integrity Issues: Skin Integrity Issues:: Stage II, Stage III, Incisions Stage II: upper mid chest Stage III: buttocks Incisions: neck s/p trach  Last BM:  04/19/22  Height:   Ht Readings from Last 1 Encounters:  02/28/22 5\' 9"  (1.753 m)    Weight:   Wt Readings from Last 1 Encounters:  03/12/18 Marland Kitchen)  154.2 kg    Ideal Body Weight:  72.7 kg  BMI:  Body mass index is 66.41 kg/m.  Estimated Nutritional Needs:   Kcal:  2700-3000kcal/day  Protein:  >150g/day  Fluid:  2.0L/day    Loistine Chance, RD, LDN, [place] Registered Dietitian II Certified Diabetes Care and Education Specialist Please refer to AMION for RD and/or RD on-call/weekend/after hours pager

## 2022-03-23 NOTE — Progress Notes (Signed)
Patient alert and oriented. Demanding his pain medication which had been given. Refuses to try and feed self. Refuses to follow his diabetic diet. Food and drinks brought in by family and fed to patient. Son in the room feeding patient breakfast. Encouraged to try and feed self and move his extremities. Patient was very rude about suggestions. Also demanded his Fentanyl. Patient was told be nurse she would have to check his MAR and see when he could have it. Told nurse he was getting " pissed " and give him his pain medication.Stated he was getting "pissed off "at the nurse and would get another nurse. Displayed a mean glare and tried to bully nurse. Informed he was being moved to 2A.

## 2022-03-23 NOTE — Progress Notes (Signed)
PROGRESS NOTE    Samuel Chavez  RXY:[id number] DOB: 1975-08-26 DOA: 02/11/2022 PCP: [facility]    Brief Narrative:  46 y.o male with significant PMH as below who presented to the ED with chief complaints of SOB and worsening bilateral lower extremities edema.   ED Course: In the emergency department, the temperature was 37.3C, the heart rate 99 beats/minute, the blood pressure 187/102 mm Hg, the respiratory rate 20 breaths/minute, and the oxygen saturation 89% on. He was noted to be drowsy but still protecting his airway and responding appropriately.       ---7/25: Admitted to hospitalist service w/acute on chronic hypoxic hypercapnic resp. failure requiring BiPAP.Failed BiPAP and intubated. PCCM consulted 7/26: INTUBATED, difficlut to sedate, started [place] 7/27: severe hypoxia, PEEP increased to 15 but decreased back to 10 7/28: Persistent severe hypoxia, ventilator changes made 7/29: Persistent hypoxia, recruitment maneuvers instituted, ventilator changes made, empiric heparin as patient cannot be scanned query PE -02/17/22- patient is severely critically ill with bilateral multifocal pneumonia on sedation and paralysis with mechanical ventilation maximal settings.  He is at cusp of death, we met with daughter today and discussed severity of illness. Unable to perform CT due to severity of critical illness. Met with previous PCCM doc and discussed medical plan.  Patient had recruitment maneuvers last few days without improvement, on heparin gtt for possible PE.  02/18/22- patient continues to require maximal settings on ventilator despite good UOP.  He destaturated to <50% spO2 on MV today required BGV.   02/19/22- patient continues to require 100% FiO2 on maximal setting with high PEEP ladder for possible ARDS.  He was diuresed and on steroids with development of AKI.  His CXR had slight interval improvement on right. We discussed case with vascular surgery regarding  possible empiric tPA for PE.  02/20/22- patient was unable to get CT today due to continued severe hypoxemia.  Daughter and other family members at bedside we reviewed case and medical plan.  There seems to be some confusion from family as they had asked me to wake patient up and take off ventilator even though I had explained multiple times that he is at very high risk for death.  They laughed at my comments and seemed to think it was not true. We may still be able to do bronchoscopy but its high risk.  We have reduced IV infusions by switching some medications to OGT route.  02/21/22- patient is weaned to 60%FiO2.  Plan for possible bronch if patient is tolerating.  Family at bedside.  If able to we will obtain more imaging and VQ scan to rule out PE.  02/22/22- Patient weaned to 50% on PRVC.  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 CBC stable , CMP with improved GFR. Monitoring Na, pharmacy and renal following for electrolytes/renal function.  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  Overall marked improvement on ventilator over past 48h. Met with daughter at bedside reviewed medical plan.  02/24/22-Vent requirements continue to slowly improve, currently 40% FiO2 and 14 PEEP.  AKI continues to slowly improve, remains hyperkalemic with potassium of 5.8.  Placed back on Veltassa, Diurese x1.  Trach scheduled for tomorrow at 1:15 PM 02/25/22- Diurese with Lasix x1 dose. Trach scheduled for today. 02/26/22: Remains mechanically ventilated via newly placed tracheostomy will attempt to wean ventilator settings today.  Perform WUA  02/26/22: Pt remains mechanically ventilated  via tracheostomy vent settings: PEEP 14/FiO2 80% 02/27/22: Fevers, ID consulted. Repeating Tracheal aspirate and UA.  Zosyn started 02/28/22: Persistent fevers, Tracheal aspirate with Staph aureus, start Linezolid.  Worsening Creatinine, considering Lasix  gtt given high vent requirements (100% FiO2, 14 peep).  D/c fentanyl gtt and decrease oral benzos/narcotics 03/01/22: worsening anuric renal failure. HD line placed and started on hemodialysis. 03/02/22: Vent support slowly improving. Plan for HD again today. 8/14-8/15: remains febrile, encephalopathic despite HD sessions 8/16 fever curve improved, clots noted in LUE 8/18: continued low grade fever (t max 100.6).  Receiving HD.  Slight improvement in encephalopathy 8/19 off pressors, off sedation, remains on vent  8/20: Afebrile, tolerating PSV 8/21: Remains afebrile, obtain CT Abdomen for IR to evaluate for PEG placement.  Tolerating PSV (15/5).  Consult PT for passive ROM 8/22: Extremely weak, but able to MAE to command, nodding to questions. Tolerating PSV, will trial TC. Deemed not candidate for IR placement of PEG, will have General Surgery evaluate 8/23: No acute events overnight tolerating SBT 10/5 will attempt to transition to TCT today  8/24: No acute events overnight continues to tolerate SBT, will attempt TCT as tolerated  8/25: Tolerating TC during day and vent at night.  Strength improving.  Robotic assisted laparoscopic gastrostomy tube placement by General Surgery, heparin currently on hold for procedure. 8/26: tolerating vent at night, TC during day.  Awaiting Surgery approval to restart Heparin gtt today. Na increased to 149 as unable to use G-tube till tomorrow for free water flushes.  Will start low dose D5W infusion 8/27: Tolerated TC all day and throughout night yesterday, General Surgery approves use of G-tube for meds and feeds.  Transition Heparin gtt to Eliquis.  Resume feeds and free water flushes 8/28: Pt placed back on ventilator overnight due to hypoxia vent settings SBT 10/10.  Tolerate trach collar and  PMV  8/29: No acute events overnight tolerated mechanical ventilation overnight; Will transition to trach collar  8/30: No significant events overnight. Tolerating TC.   Passed speech evaluation 8/31: Tolerating TC, Passy Muir valve and diet.  Complains of left leg pain, resume home Gabapentin and flexeril, obtain venous US LLE. 9/1 TRH pickup  9/2 no issues today. Feels fine. No sob or cp. K 5.3 9/3 Pt tolerating feeding. Ask RD to see if still needs TF.    Consultants:  PCCM Nephrology Vascular Surgery Palliative Care ENT Infectious Disease General Surgery     Pccm, nephrology, general surgery  Procedures:  7/25: Intubation 7/26: PICC triple-lumen, right basilic vein 3/66: Left radial arterial line  8/08: Size 8 proximal XLT Shiley inserted 8/12: Right IJ HD catheter placed 8/25: Robotic assisted laparoscopic gastrostomy tube placement by General Surgery    7/25  Chest Xray:Chest x-ray showed cardiomegaly and pulmonary vascular congestion without overt pulmonary edema 7/25 Echocardiogram: difficult study, LVEF was estimated at 55 to 60%, dilated LV moderate LVH, grade 1 DD, enlargement of the right ventricle, left atrial size moderately dilated, right atrial size moderately dilated this is consistent with restrictive physiology (echo reading not congruous with prior echoes from [facility]) 7/29: Venous US BLE>>IMPRESSION: No lower extremity DVT. 7/30 Limited echo: LVEF 40 to 45% decreased LV function, no wall motion abnormalities, moderate dilation of the LV concentric LVH, right ventricular size moderately enlarged, right atrial enlargement, this is more consistent with prior [facility] scans 8/4: CT Head>>No acute intracranial abnormality. Paranasal sinusitis. Bilateral complete opacification of the mastoid air cells and inner ears, as can be  seen in the setting of otitis media. Correlate with symptoms. 8/4: CT Chest/Abdomen/Pelvis>>Extensive ground-glass, alveolar and patchy dense infiltrates in both lungs suggesting multifocal pneumonia. Part of this finding may suggest underlying pulmonary edema. Small bilateral pleural  effusions are seen. Cardiomegaly. There is ectasia of the main pulmonary artery suggesting pulmonary arterial hypertension. There is no evidence of intestinal obstruction or pneumoperitoneum. There is no hydronephrosis. Appendix is not dilated. UB diverticula are seen in the colon without signs of focal diverticulitis. Severe degenerative changes are noted at L4-L5 level with significant interval worsening. Findings may be due to severe disc degeneration. If there is clinical suspicion for discitis or osteomyelitis, follow-up MRI may be considered 8/4: Lung V/Q>>Pulmonary embolism absent. 8/15: Venous US BLE>>No evidence of deep venous thrombosis in either lower extremity 8/16: Venous US BLE>>1. Non-occlusive/nearly occlusive thrombus in one of paired right upper arm brachial veins containing the indwelling PICC line and occlusive thrombus in the right axillary vein. The visualized segment of the right subclavian vein is patent. No evidence of left upper extremity DVT. 8/31: Venous US LLE>>negative      Antimicrobials:  7/25: SARS-CoV-2 PCR> negative 7/25: MRSA PCR>> NEG 7/27: Strep pneumoniae Ag>> negative 7/27: Legionella Ag>> negative 7/27: Mycoplasma>> <770 7/27: Sputum>> Strep pneumo,Haemophilus influenzae 7/30 Sputum cult >> negative 8/4: Tracheal aspirate>>negative 8/9: Tracheal aspirate>>MRSA 8/10: RVP>>negative 8/10: Right nasal Wound culture>>STAPHYLOCOCCUS AUREUS, PROVOTELLA 8/10: MRSA PCR>> Positive 8/11: Blood culture 1/4>>Staphylococcus epidermidis MecA/C (suspect contaminant)     ABX: Cefepime 7/27 >>7/31 Ceftriaxone 7/31>>8/4 Vancomycin 7/27 >> 7/29, restarted 7/30 (resumed due to fever spike on Maxipime) ? Resistant Strep pneumo >> 7/31 Unasyn 8/14>>8/17 Augmentin 8/17>>8/21 Linezolid 8/11>>8/20     Subjective: Has no complaints of sob, cp, abd pain  Objective: Vitals:   03/23/22 0900 03/23/22 0914 03/23/22 1000 03/23/22 1144  BP: 133/86 133/86 (!)  153/85 126/74  Pulse: 81 86 91 87  Resp: $Remo'15  13 18  '[place]$ Temp:    98.1 F (36.7 C)  TempSrc:      SpO2: 99%  97% 96%  Height:        Intake/Output Summary (Last 24 hours) at 03/23/2022 1441 Last data filed at 03/23/2022 1237 Gross per 24 hour  Intake 576 ml  Output 4800 ml  Net -4224 ml   Filed Weights    Examination: Calm, NAD Mild coarse bs, no wheezing Reg s1/s2 no gallop Soft benign +bs Edema x4. Aaoxox3  Mood and affect appropriate in current setting     Data Reviewed: I have personally reviewed following labs and imaging studies  CBC: Recent Labs  Lab 03/17/22 0614 03/18/22 0323 03/19/22 0425 03/20/22 0639 03/21/22 0530  WBC 6.5 7.1 6.9 6.6 6.4  NEUTROABS 4.5 4.6 4.7 4.4 3.9  HGB 9.1* 8.2* 8.4* 8.2* 8.4*  HCT 31.8* 29.1* 29.4* 28.8* 30.2*  MCV 95.2 94.2 94.5 94.4 95.9  PLT 274 289 269 262 694   Basic Metabolic Panel: Recent Labs  Lab 03/18/22 0323 03/19/22 0425 03/20/22 0639 03/21/22 0530 03/22/22 0915 03/23/22 0916  NA 147* 143 139 140  --  138  K 3.6 3.8 4.2 4.9 5.3* 5.3*  CL 114* 109 105 104  --  103  CO2 $Re'28 30 28 'hot$ 32  --  31  GLUCOSE 138* 146* 139* 102*  --  128*  BUN 42* 37* 35* 30*  --  30*  CREATININE 1.07 0.85 0.78 0.72  --  0.68  CALCIUM 8.1* 7.9* 8.0* 8.2*  --  8.3*  MG 1.8 1.5* 1.6*  1.7  --  1.3*  PHOS 2.7 2.4* 3.2 3.7  --  2.5   GFR: Estimated Creatinine Clearance: 206.7 mL/min (by C-G formula based on SCr of 0.68 mg/dL). Liver Function Tests: Recent Labs  Lab 03/17/22 8916 03/18/22 0323 03/19/22 0425 03/20/22 0639 03/21/22 0530  ALBUMIN 2.6* 2.5* 2.4* 2.5* 2.7*   No results for input(s): "LIPASE", "AMYLASE" in the last 168 hours. No results for input(s): "AMMONIA" in the last 168 hours. Coagulation Profile: No results for input(s): "INR", "PROTIME" in the last 168 hours. Cardiac Enzymes: No results for input(s): "CKTOTAL", "CKMB", "CKMBINDEX", "TROPONINI" in the last 168 hours. BNP (last 3 results) No results for  input(s): "PROBNP" in the last 8760 hours. HbA1C: No results for input(s): "HGBA1C" in the last 72 hours. CBG: Recent Labs  Lab 03/22/22 1954 03/22/22 2342 03/23/22 0311 03/23/22 0814 03/23/22 1141  GLUCAP 171* 129* 88 97 133*   Lipid Profile: No results for input(s): "CHOL", "HDL", "LDLCALC", "TRIG", "CHOLHDL", "LDLDIRECT" in the last 72 hours. Thyroid Function Tests: No results for input(s): "TSH", "T4TOTAL", "FREET4", "T3FREE", "THYROIDAB" in the last 72 hours. Anemia Panel: No results for input(s): "VITAMINB12", "FOLATE", "FERRITIN", "TIBC", "IRON", "RETICCTPCT" in the last 72 hours. Sepsis Labs: No results for input(s): "PROCALCITON", "LATICACIDVEN" in the last 168 hours.  No results found for this or any previous visit (from the past 240 hour(s)).       Radiology Studies: No results found.      Scheduled Meds:  apixaban  5 mg Per Tube BID   carvedilol  6.25 mg Per Tube BID WC   Chlorhexidine Gluconate Cloth  6 each Topical Q0600   escitalopram  10 mg Per Tube Daily   feeding supplement (OSMOLITE 1.5 CAL)  237 mL Per Tube 6 X Daily   feeding supplement (PROSource TF20)  60 mL Per Tube TID   free water  120 mL Per Tube 6 X Daily   gabapentin  600 mg Per Tube BID   insulin aspart  0-20 Units Subcutaneous Q4H   insulin aspart  4 Units Subcutaneous Q4H   insulin detemir  22 Units Subcutaneous BID   ipratropium-albuterol  3 mL Nebulization Q6H   lidocaine  1 patch Transdermal Q24H   mupirocin ointment   Nasal BID   nutrition supplement (JUVEN)  1 packet Per Tube BID BM   mouth rinse  15 mL Mouth Rinse 4 times per day   pantoprazole  40 mg Per Tube Daily   sodium zirconium cyclosilicate  10 g Per Tube Once   Continuous Infusions:  sodium chloride 10 mL/hr at 03/14/22 1036    Assessment & Plan:   Principal Problem:   Acute respiratory failure with hypoxia and hypercarbia (HCC) Active Problems:   Acute on chronic systolic CHF (congestive heart failure)  (HCC)   Hypertensive urgency   Type 2 diabetes mellitus with peripheral neuropathy (HCC)   Acute heart failure with preserved ejection fraction (HCC)   Renal failure (ARF), acute on chronic (HCC)   Hospital-acquired pneumonia   ARDS (adult respiratory distress syndrome) (HCC)   Pressure injury of skin    Acute on chronic hypoxemic and hypercapneic respiratory failure now s/p tracheostomy PMHx: OSA/OHS - Full vent support, implement lung protective strategies - Plateau pressures less than 30 cm H20 - Wean FiO2 & PEEP as tolerated to maintain O2 sats >92% - Follow intermittent Chest X-ray & ABG as needed - SBT vs TCT as tolerated  - TCT during the day as tolerated and  nocturnal ventilation  - Implement VAP Bundle 9/3 continue with inhalers Transfer to 2A     MRSA HCAP- completed linezolid x 7 days Provetella sinusitis- completed unasyn/augmentin Persistent low grade fever, suspect due to RUE DVT ~  -Resolved Monitor fever curve 9/3 ID signed off         Acute renal failure ~resolved Hypernatremia~resolved - Trend BMP - Replace electrolytes as indicated - Monitor UOP - Nephrology consulted appreciate input: HD per recommendations ~ currently no HD indicated (temporary HD catheter has been removed) 9/1 sodium level normal Continue free water flushes to 150 every 2 hours AKI resolved     Hyperkalemia Mild K 5.3 Will give lokelma    Hypomagnesium 1.3 Will replace with 2gm iv  Monitor    DM2 with hyperglycemia - CBG's q4h; Target range of 140 to 180 9/2 BG stable  Continue R-ISS       Ongoing severe toxic encephalopathy- RESOLVED 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  Still uremic.  EEG 8/16 neg.  - Avoid sedating medications as able - PT/OT following - Mobilize as able   PICC associated DVT- PICC removed - Continue eliquis    Depression  - Continue lexapro   Left Leg pain PMHx: Sciatica  as reported by pt -Pain control -Resume home gabapentin and flexeril L LE venous ultrasound negative            Resolved hospital problem list:  Community-acquired pneumonia: Streptococcus pneumoniae and Haemophilus influenzae ~ TREATED MRSA HCAP ~ TREATED Provetella Sinusitis ~ TREATED   DVT prophylaxis: Eliquis Code Status: Full full Family Communication: None at bedside Disposition Plan:  Status is: Inpatient Remains inpatient appropriate because: IV treatment.  Complex patient.  Will need long-term acute care        LOS: 40 days   Time spent: 35 min    Nolberto Hanlon, MD [facility] Pager [phone]  If 7PM-7AM, please contact night-coverage 03/23/2022, 2:41 PM

## 2022-03-23 NOTE — Progress Notes (Signed)
1035 Report called to [facility] on 2 A. ![phone] Moved via bed with great difficulty to room 254.

## 2022-03-23 NOTE — Consult Note (Signed)
PHARMACY CONSULT NOTE  Pharmacy Consult for Electrolyte Monitoring and Replacement   Recent Labs: Potassium (mmol/L)  Date Value  03/23/2022 4.8   Magnesium (mg/dL)  Date Value  03/23/2022 1.3 (L)   Calcium (mg/dL)  Date Value  03/23/2022 8.3 (L)   Albumin (g/dL)  Date Value  03/21/2022 2.7 (L)   Phosphorus (mg/dL)  Date Value  03/23/2022 2.5   Sodium (mmol/L)  Date Value  03/23/2022 138   Assessment: Patient is a 46 y/o M with medical history including DM, HTN, pancreatitis, tobacco use disorder, systolic CHF, OSA, DM c/b diabetic neuropathy, lumbar radiculopathy, morbid obesity who is admitted with acute respiratory failure in setting of acute CHF and hypertensive urgency. Pharmacy consulted to assist with electrolyte monitoring and replacement as indicated.  Admission complicated by renal insufficiency requiring renal replacement therapy at one point which has now been discontinued with renal recovery.  Nutrition: tube feeds @70ml /h, FWF 125>129ml q2h, Juven supplement,   Goal of Therapy:  Electrolytes within normal limits  Plan:  K 5.3  Mag 1.3  Phos 2.5  Scr 0.68    K 5.3 : Will order lokelma 10 gm x 1 again today Mag 1.3:  Will order magnesium sulfate 2 gm IV x 1  F/u K at 1800 - repeat K 4.8 Follow-up electrolytes with AM labs tomorrow  Paulina Fusi, PharmD, BCPS 03/23/2022 7:34 PM

## 2022-03-24 DIAGNOSIS — J9602 Acute respiratory failure with hypercapnia: Secondary | ICD-10-CM | POA: Diagnosis not present

## 2022-03-24 DIAGNOSIS — J9601 Acute respiratory failure with hypoxia: Secondary | ICD-10-CM | POA: Diagnosis not present

## 2022-03-24 LAB — GLUCOSE, CAPILLARY
Glucose-Capillary: 102 mg/dL — ABNORMAL HIGH (ref 70–99)
Glucose-Capillary: 123 mg/dL — ABNORMAL HIGH (ref 70–99)
Glucose-Capillary: 127 mg/dL — ABNORMAL HIGH (ref 70–99)
Glucose-Capillary: 141 mg/dL — ABNORMAL HIGH (ref 70–99)
Glucose-Capillary: 147 mg/dL — ABNORMAL HIGH (ref 70–99)
Glucose-Capillary: 97 mg/dL (ref 70–99)

## 2022-03-24 LAB — POTASSIUM: Potassium: 5.1 mmol/L (ref 3.5–5.1)

## 2022-03-24 LAB — MAGNESIUM: Magnesium: 1.6 mg/dL — ABNORMAL LOW (ref 1.7–2.4)

## 2022-03-24 MED ORDER — PROSOURCE TF20 ENFIT COMPATIBL EN LIQD
60.0000 mL | Freq: Four times a day (QID) | ENTERAL | Status: DC
  Administered 2022-03-24 – 2022-03-26 (×2): 60 mL

## 2022-03-24 MED ORDER — FREE WATER
30.0000 mL | Freq: Four times a day (QID) | Status: DC
  Administered 2022-03-24 – 2022-03-26 (×5): 30 mL

## 2022-03-24 MED ORDER — JUVEN PO PACK
1.0000 | PACK | Freq: Two times a day (BID) | ORAL | Status: DC
  Administered 2022-03-26: 1

## 2022-03-24 MED ORDER — MAGNESIUM SULFATE 2 GM/50ML IV SOLN
2.0000 g | Freq: Once | INTRAVENOUS | Status: AC
Start: 2022-03-24 — End: 2022-03-24
  Administered 2022-03-24: 2 g via INTRAVENOUS
  Filled 2022-03-24: qty 50

## 2022-03-24 MED ORDER — INSULIN ASPART 100 UNIT/ML IJ SOLN
0.0000 [IU] | Freq: Three times a day (TID) | INTRAMUSCULAR | Status: DC
  Administered 2022-03-25 – 2022-03-26 (×2): 3 [IU] via SUBCUTANEOUS
  Administered 2022-03-26: 6 [IU] via SUBCUTANEOUS
  Administered 2022-03-26 – 2022-03-27 (×3): 3 [IU] via SUBCUTANEOUS
  Administered 2022-03-27: 4 [IU] via SUBCUTANEOUS
  Administered 2022-03-28: 3 [IU] via SUBCUTANEOUS
  Administered 2022-03-29: 4 [IU] via SUBCUTANEOUS
  Administered 2022-03-29 – 2022-03-31 (×3): 3 [IU] via SUBCUTANEOUS
  Administered 2022-03-31 – 2022-04-01 (×2): 4 [IU] via SUBCUTANEOUS
  Administered 2022-04-01 – 2022-04-03 (×4): 3 [IU] via SUBCUTANEOUS
  Administered 2022-04-03 – 2022-04-04 (×2): 4 [IU] via SUBCUTANEOUS
  Administered 2022-04-04: 3 [IU] via SUBCUTANEOUS
  Administered 2022-04-04 – 2022-04-05 (×2): 4 [IU] via SUBCUTANEOUS
  Administered 2022-04-05 – 2022-04-06 (×5): 3 [IU] via SUBCUTANEOUS
  Filled 2022-03-24 (×29): qty 1

## 2022-03-24 NOTE — Progress Notes (Signed)
Nutrition Follow Up Note   DOCUMENTATION CODES:   Morbid obesity  INTERVENTION:   ProSource TF20- Give 93m QID via tube, each supplement provides 80kcal and 20g of protein.   Free water flushes 338mQID via tube   Juven Fruit Punch BID, each serving provides 95kcal and 2.5g of protein (amino acids glutamine and arginine)  48 hour calorie count   NUTRITION DIAGNOSIS:   Inadequate oral intake related to inability to eat (pt sedated and ventilated) as evidenced by NPO status.  GOAL:   Patient will meet greater than or equal to 90% of their needs -previously met with tube feeds   MONITOR:   PO intake, Supplement acceptance, Labs, Weight trends, Skin, I & O's  ASSESSMENT:   [age]/o male with h/o COPD, CHF, HTN, DM and OSA who is admitted with CHF exacerbation, CAP and severe ARDS.  -Pt s/p tracheostomy and NGT placement 8/8 (now removed) -Pt s/p new HD 8/12 (now discontinued)  -Pt s/p surgically placed 16 French gastric tube 8/25  Pt remains on trach collar. Met with pt in room today. Pt is able to communicate with PMSV. Pt sitting up at time of RD visit and is getting ready to eat lunch. Pt has been refusing tube feeds. Pt reports that he is too full to eat with tube feeds going. Tube feeds changed over to nocturnal feeds over the weekend. RD discussed with pt the importance of adequate nutrition needed to preserve lean muscle and to support wound healing. Pt reports that he is willing to accept tube feeds if he needs them. RD will hold tube feeds and obtain a 48 hour calorie count to assess if pt is eating enough to meet his estimated needs. If pt needs additional calories and protein, will plan on doing bolus feeds of Osmolite 1.5 in between meals. RD will continue Juven and protein modulars via tube for now as pt's wound is still healing. Pt has not has BM since 8/30; MD aware. Per chart, pt remains up ~ 25lbs from his UBW. Pt with good UOP and is self diuresing. Pt continues to  have moderate edema but this is much improved.    Medications reviewed and include: insulin, juven, protonix, Mg sulfate   Labs reviewed: K 5.1 wnl, Mg 1.6(L) Na 138 wnl, BUN 30(H), P 2.5 wnl- 9/3 Cbgs- 147, 123, 102, 97 x 24 hrs  UOP- [id number] Nutrition Focused Physical Exam:  Flowsheet Row Most Recent Value  Orbital Region No depletion  Upper Arm Region No depletion  Thoracic and Lumbar Region No depletion  Buccal Region No depletion  Temple Region No depletion  Clavicle Bone Region No depletion  Clavicle and Acromion Bone Region No depletion  Scapular Bone Region No depletion  Dorsal Hand No depletion  Patellar Region No depletion  Anterior Thigh Region No depletion  Posterior Calf Region No depletion  Edema (RD Assessment) Moderate  Hair Reviewed  Eyes Reviewed  Mouth Reviewed  Skin Reviewed  Nails Reviewed   Diet Order:   Diet Order             Diet regular Room service appropriate? Yes with Assist; Fluid consistency: Thin  Diet effective now                  EDUCATION NEEDS:   Education needs have been addressed  Skin:    Stage III: buttocks Incisions: neck s/p trach  Last BM:  03/19/22  Height:   Ht Readings from Last  1 Encounters:  02/28/22 _0  (1.753 m)    Weight:   Wt Readings from Last 1 Encounters:  03/12/18 (!) 154.2 kg    Ideal Body Weight:  72.7 kg  BMI:  Body mass index is 66.41 kg/m.  Estimated Nutritional Needs:   Kcal:  2700-3000kcal/day  Protein:  >150g/day  Fluid:  2.0L/day  Koleen Distance MS, RD, LDN Please refer to [facility] for RD and/or RD on-call/weekend/after hours pager

## 2022-03-24 NOTE — Progress Notes (Signed)
       CROSS COVER NOTE  NAME: Samuel Chavez MRN: [id number] DOB : 1976/04/05    Date of Service   03/24/2022   HPI/Events of Note   Urgently paged to bedside by RN for reports of "a lot of blood from [place]". On arrival to bedside RN and RT report [name] had approximately 3-26mL of blood in inner cannula and minimal bloody secretions when suctioned. [name] is in bed alert and in no acute distress. No desaturation events reported and RR is 16.  Interventions   Plan: Monitor for change or increase in secretions    This document was prepared using Dragon voice recognition software and may include unintentional dictation errors.  Neomia Glass DNP, MHA, FNP-BC Nurse Practitioner [facility] Pager [phone]

## 2022-03-24 NOTE — Progress Notes (Signed)
Notified by pt's son of blood from trach. Went to bedside and noted small amt (approx 47ml) bright red blood on outside of trach that pt coughed up. No respiratory distress; denies sob. RT notified and at bedside to assess. RT started to suction pt and asked RN to page provider for "a lot of blood".  NP paged and at bedside. However after RT suctioned pt RT reports was less blood than initially thought. Had approx 61ml red blood in inner cannula and only slight amt when suctioned by RT. Pt is on eliquis. NP at bedside and aware of above.

## 2022-03-24 NOTE — Progress Notes (Addendum)
       CROSS COVER NOTE  NAME: Samuel Chavez MRN: [id number] DOB : 1976-06-03    Date of Service   03/24/2022   HPI/Events of Note   Request received from nursing to change CBG checks and SSI to AC/HS and to decrease frequency of PRN fentanyl doses now that patient is eating and no longer on tube feeds.  Interventions   Plan: CBG AC/HS SSI AC/HS PRN fentanyl to remain q2H      This document was prepared using Dragon voice recognition software and may include unintentional dictation errors.  Neomia Glass DNP, MHA, FNP-BC Nurse Practitioner [facility] Pager [phone]

## 2022-03-24 NOTE — Progress Notes (Signed)
PROGRESS NOTE    Samuel Chavez  RXY:[id number] DOB: 1975-08-26 DOA: 02/11/2022 PCP: [facility]    Brief Narrative:  46 y.o male with significant PMH as below who presented to the ED with chief complaints of SOB and worsening bilateral lower extremities edema.   ED Course: In the emergency department, the temperature was 37.3C, the heart rate 99 beats/minute, the blood pressure 187/102 mm Hg, the respiratory rate 20 breaths/minute, and the oxygen saturation 89% on. He was noted to be drowsy but still protecting his airway and responding appropriately.       ---7/25: Admitted to hospitalist service w/acute on chronic hypoxic hypercapnic resp. failure requiring BiPAP.Failed BiPAP and intubated. PCCM consulted 7/26: INTUBATED, difficlut to sedate, started [place] 7/27: severe hypoxia, PEEP increased to 15 but decreased back to 10 7/28: Persistent severe hypoxia, ventilator changes made 7/29: Persistent hypoxia, recruitment maneuvers instituted, ventilator changes made, empiric heparin as patient cannot be scanned query PE -02/17/22- patient is severely critically ill with bilateral multifocal pneumonia on sedation and paralysis with mechanical ventilation maximal settings.  He is at cusp of death, we met with daughter today and discussed severity of illness. Unable to perform CT due to severity of critical illness. Met with previous PCCM doc and discussed medical plan.  Patient had recruitment maneuvers last few days without improvement, on heparin gtt for possible PE.  02/18/22- patient continues to require maximal settings on ventilator despite good UOP.  He destaturated to <50% spO2 on MV today required BGV.   02/19/22- patient continues to require 100% FiO2 on maximal setting with high PEEP ladder for possible ARDS.  He was diuresed and on steroids with development of AKI.  His CXR had slight interval improvement on right. We discussed case with vascular surgery regarding  possible empiric tPA for PE.  02/20/22- patient was unable to get CT today due to continued severe hypoxemia.  Daughter and other family members at bedside we reviewed case and medical plan.  There seems to be some confusion from family as they had asked me to wake patient up and take off ventilator even though I had explained multiple times that he is at very high risk for death.  They laughed at my comments and seemed to think it was not true. We may still be able to do bronchoscopy but its high risk.  We have reduced IV infusions by switching some medications to OGT route.  02/21/22- patient is weaned to 60%FiO2.  Plan for possible bronch if patient is tolerating.  Family at bedside.  If able to we will obtain more imaging and VQ scan to rule out PE.  02/22/22- Patient weaned to 50% on PRVC.  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 CBC stable , CMP with improved GFR. Monitoring Na, pharmacy and renal following for electrolytes/renal function.  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  Overall marked improvement on ventilator over past 48h. Met with daughter at bedside reviewed medical plan.  02/24/22-Vent requirements continue to slowly improve, currently 40% FiO2 and 14 PEEP.  AKI continues to slowly improve, remains hyperkalemic with potassium of 5.8.  Placed back on Veltassa, Diurese x1.  Trach scheduled for tomorrow at 1:15 PM 02/25/22- Diurese with Lasix x1 dose. Trach scheduled for today. 02/26/22: Remains mechanically ventilated via newly placed tracheostomy will attempt to wean ventilator settings today.  Perform WUA  02/26/22: Pt remains mechanically ventilated  via tracheostomy vent settings: PEEP 14/FiO2 80% 02/27/22: Fevers, ID consulted. Repeating Tracheal aspirate and UA.  Zosyn started 02/28/22: Persistent fevers, Tracheal aspirate with Staph aureus, start Linezolid.  Worsening Creatinine, considering Lasix  gtt given high vent requirements (100% FiO2, 14 peep).  D/c fentanyl gtt and decrease oral benzos/narcotics 03/01/22: worsening anuric renal failure. HD line placed and started on hemodialysis. 03/02/22: Vent support slowly improving. Plan for HD again today. 8/14-8/15: remains febrile, encephalopathic despite HD sessions 8/16 fever curve improved, clots noted in LUE 8/18: continued low grade fever (t max 100.6).  Receiving HD.  Slight improvement in encephalopathy 8/19 off pressors, off sedation, remains on vent  8/20: Afebrile, tolerating PSV 8/21: Remains afebrile, obtain CT Abdomen for IR to evaluate for PEG placement.  Tolerating PSV (15/5).  Consult PT for passive ROM 8/22: Extremely weak, but able to MAE to command, nodding to questions. Tolerating PSV, will trial TC. Deemed not candidate for IR placement of PEG, will have General Surgery evaluate 8/23: No acute events overnight tolerating SBT 10/5 will attempt to transition to TCT today  8/24: No acute events overnight continues to tolerate SBT, will attempt TCT as tolerated  8/25: Tolerating TC during day and vent at night.  Strength improving.  Robotic assisted laparoscopic gastrostomy tube placement by General Surgery, heparin currently on hold for procedure. 8/26: tolerating vent at night, TC during day.  Awaiting Surgery approval to restart Heparin gtt today. Na increased to 149 as unable to use G-tube till tomorrow for free water flushes.  Will start low dose D5W infusion 8/27: Tolerated TC all day and throughout night yesterday, General Surgery approves use of G-tube for meds and feeds.  Transition Heparin gtt to Eliquis.  Resume feeds and free water flushes 8/28: Pt placed back on ventilator overnight due to hypoxia vent settings SBT 10/10.  Tolerate trach collar and  PMV  8/29: No acute events overnight tolerated mechanical ventilation overnight; Will transition to trach collar  8/30: No significant events overnight. Tolerating TC.   Passed speech evaluation 8/31: Tolerating TC, Passy Muir valve and diet.  Complains of left leg pain, resume home Gabapentin and flexeril, obtain venous US LLE. 9/1 TRH pickup  9/2 no issues today. Feels fine. No sob or cp. K 5.3 9/3 Pt tolerating feeding. Ask RD to see if still needs TF.  9/4 RD managing TF. D/w pt importance of nutrition for infection/wound healing   Consultants:  PCCM Nephrology Vascular Surgery Palliative Care ENT Infectious Disease General Surgery     Pccm, nephrology, general surgery  Procedures:  7/25: Intubation 7/26: PICC triple-lumen, right basilic vein 1/15: Left radial arterial line  8/08: Size 8 proximal XLT Shiley inserted 8/12: Right IJ HD catheter placed 8/25: Robotic assisted laparoscopic gastrostomy tube placement by General Surgery    7/25  Chest Xray:Chest x-ray showed cardiomegaly and pulmonary vascular congestion without overt pulmonary edema 7/25 Echocardiogram: difficult study, LVEF was estimated at 55 to 60%, dilated LV moderate LVH, grade 1 DD, enlargement of the right ventricle, left atrial size moderately dilated, right atrial size moderately dilated this is consistent with restrictive physiology (echo reading not congruous with prior echoes from [facility]) 7/29: Venous US BLE>>IMPRESSION: No lower extremity DVT. 7/30 Limited echo: LVEF 40 to 45% decreased LV function, no wall motion abnormalities, moderate dilation of the LV concentric LVH, right ventricular size moderately enlarged, right atrial enlargement, this is more consistent with prior [facility] scans 8/4: CT Head>>No acute intracranial abnormality. Paranasal sinusitis. Bilateral complete  opacification of the mastoid air cells and inner ears, as can be seen in the setting of otitis media. Correlate with symptoms. 8/4: CT Chest/Abdomen/Pelvis>>Extensive ground-glass, alveolar and patchy dense infiltrates in both lungs suggesting multifocal pneumonia. Part  of this finding may suggest underlying pulmonary edema. Small bilateral pleural effusions are seen. Cardiomegaly. There is ectasia of the main pulmonary artery suggesting pulmonary arterial hypertension. There is no evidence of intestinal obstruction or pneumoperitoneum. There is no hydronephrosis. Appendix is not dilated. UB diverticula are seen in the colon without signs of focal diverticulitis. Severe degenerative changes are noted at L4-L5 level with significant interval worsening. Findings may be due to severe disc degeneration. If there is clinical suspicion for discitis or osteomyelitis, follow-up MRI may be considered 8/4: Lung V/Q>>Pulmonary embolism absent. 8/15: Venous US BLE>>No evidence of deep venous thrombosis in either lower extremity 8/16: Venous US BLE>>1. Non-occlusive/nearly occlusive thrombus in one of paired right upper arm brachial veins containing the indwelling PICC line and occlusive thrombus in the right axillary vein. The visualized segment of the right subclavian vein is patent. No evidence of left upper extremity DVT. 8/31: Venous US LLE>>negative      Antimicrobials:  7/25: SARS-CoV-2 PCR> negative 7/25: MRSA PCR>> NEG 7/27: Strep pneumoniae Ag>> negative 7/27: Legionella Ag>> negative 7/27: Mycoplasma>> <770 7/27: Sputum>> Strep pneumo,Haemophilus influenzae 7/30 Sputum cult >> negative 8/4: Tracheal aspirate>>negative 8/9: Tracheal aspirate>>MRSA 8/10: RVP>>negative 8/10: Right nasal Wound culture>>STAPHYLOCOCCUS AUREUS, PROVOTELLA 8/10: MRSA PCR>> Positive 8/11: Blood culture 1/4>>Staphylococcus epidermidis MecA/C (suspect contaminant)     ABX: Cefepime 7/27 >>7/31 Ceftriaxone 7/31>>8/4 Vancomycin 7/27 >> 7/29, restarted 7/30 (resumed due to fever spike on Maxipime) ? Resistant Strep pneumo >> 7/31 Unasyn 8/14>>8/17 Augmentin 8/17>>8/21 Linezolid 8/11>>8/20     Subjective: Denies any sob, bleed from trach, cp, abd pain  Objective: Vitals:    03/24/22 0100 03/24/22 0151 03/24/22 0344 03/24/22 0820  BP:   (!) 140/85 (!) 151/84  Pulse:   86 93  Resp: 15  18 20   Temp:   98.6 F (37 C) 99.2 F (37.3 C)  TempSrc:   Oral   SpO2: 94% 95% 98% 100%  Height:        Intake/Output Summary (Last 24 hours) at 03/24/2022 0825 Last data filed at 03/24/2022 0342 Gross per 24 hour  Intake 565.38 ml  Output 8750 ml  Net -8184.62 ml   Filed Weights    Examination: Calm, NAD TC inplace Cta no w/r Reg s1/s2 no gallop Soft benign +bs Mild edema x4 Aaoxox3  Mood and affect appropriate in current setting     Data Reviewed: I have personally reviewed following labs and imaging studies  CBC: Recent Labs  Lab 03/18/22 0323 03/19/22 0425 03/20/22 0639 03/21/22 0530  WBC 7.1 6.9 6.6 6.4  NEUTROABS 4.6 4.7 4.4 3.9  HGB 8.2* 8.4* 8.2* 8.4*  HCT 29.1* 29.4* 28.8* 30.2*  MCV 94.2 94.5 94.4 95.9  PLT 289 269 262 308   Basic Metabolic Panel: Recent Labs  Lab 03/18/22 0323 03/19/22 0425 03/20/22 0639 03/21/22 0530 03/22/22 0915 03/23/22 0916 03/23/22 1803 03/24/22 0706  NA 147* 143 139 140  --  138  --   --   K 3.6 3.8 4.2 4.9 5.3* 5.3* 4.8 5.1  CL 114* 109 105 104  --  103  --   --   CO2 28 30 28  32  --  31  --   --   GLUCOSE 138* 146* 139* 102*  --  128*  --   --  BUN 42* 37* 35* 30*  --  30*  --   --   CREATININE 1.07 0.85 0.78 0.72  --  0.68  --   --   CALCIUM 8.1* 7.9* 8.0* 8.2*  --  8.3*  --   --   MG 1.8 1.5* 1.6* 1.7  --  1.3*  --  1.6*  PHOS 2.7 2.4* 3.2 3.7  --  2.5  --   --    GFR: Estimated Creatinine Clearance: 206.7 mL/min (by C-G formula based on SCr of 0.68 mg/dL). Liver Function Tests: Recent Labs  Lab 03/18/22 0323 03/19/22 0425 03/20/22 0639 03/21/22 0530  ALBUMIN 2.5* 2.4* 2.5* 2.7*   No results for input(s): "LIPASE", "AMYLASE" in the last 168 hours. No results for input(s): "AMMONIA" in the last 168 hours. Coagulation Profile: No results for input(s): "INR", "PROTIME" in the last 168  hours. Cardiac Enzymes: No results for input(s): "CKTOTAL", "CKMB", "CKMBINDEX", "TROPONINI" in the last 168 hours. BNP (last 3 results) No results for input(s): "PROBNP" in the last 8760 hours. HbA1C: No results for input(s): "HGBA1C" in the last 72 hours. CBG: Recent Labs  Lab 03/23/22 1952 03/23/22 2246 03/24/22 0038 03/24/22 0348 03/24/22 0816  GLUCAP 124* 123* 97 102* 123*   Lipid Profile: No results for input(s): "CHOL", "HDL", "LDLCALC", "TRIG", "CHOLHDL", "LDLDIRECT" in the last 72 hours. Thyroid Function Tests: No results for input(s): "TSH", "T4TOTAL", "FREET4", "T3FREE", "THYROIDAB" in the last 72 hours. Anemia Panel: No results for input(s): "VITAMINB12", "FOLATE", "FERRITIN", "TIBC", "IRON", "RETICCTPCT" in the last 72 hours. Sepsis Labs: No results for input(s): "PROCALCITON", "LATICACIDVEN" in the last 168 hours.  No results found for this or any previous visit (from the past 240 hour(s)).       Radiology Studies: No results found.      Scheduled Meds:  (feeding supplement) PROSource Plus  30 mL Oral TID BM   apixaban  5 mg Per Tube BID   carvedilol  6.25 mg Per Tube BID WC   Chlorhexidine Gluconate Cloth  6 each Topical Q0600   escitalopram  10 mg Per Tube Daily   free water  30 mL Per Tube Q4H   gabapentin  600 mg Per Tube BID   insulin aspart  0-20 Units Subcutaneous Q4H   insulin aspart  4 Units Subcutaneous Q4H   insulin detemir  22 Units Subcutaneous BID   ipratropium-albuterol  3 mL Nebulization Q6H   lidocaine  1 patch Transdermal Q24H   mupirocin ointment   Nasal BID   nutrition supplement (JUVEN)  1 packet Oral BID BM   mouth rinse  15 mL Mouth Rinse 4 times per day   pantoprazole  40 mg Per Tube Daily   Continuous Infusions:  sodium chloride 10 mL/hr at 03/14/22 1036   feeding supplement (OSMOLITE 1.5 CAL)      Assessment & Plan:   Principal Problem:   Acute respiratory failure with hypoxia and hypercarbia (HCC) Active  Problems:   Acute on chronic systolic CHF (congestive heart failure) (HCC)   Hypertensive urgency   Type 2 diabetes mellitus with peripheral neuropathy (HCC)   Acute heart failure with preserved ejection fraction (HCC)   Renal failure (ARF), acute on chronic (HCC)   Hospital-acquired pneumonia   ARDS (adult respiratory distress syndrome) (HCC)   Pressure injury of skin    Acute on chronic hypoxemic and hypercapneic respiratory failure now s/p tracheostomy PMHx: OSA/OHS - Full vent support, implement lung protective strategies - Plateau pressures less than  30 cm H20 - Wean FiO2 & PEEP as tolerated to maintain O2 sats >92% - Follow intermittent Chest X-ray & ABG as needed - SBT vs TCT as tolerated  - TCT during the day as tolerated and nocturnal ventilation  - Implement VAP Bundle 9/4 continue inhalers      MRSA HCAP- completed linezolid x 7 days Provetella sinusitis- completed unasyn/augmentin Persistent low grade fever, suspect due to RUE DVT ~  -Resolved Monitor fever curve 9/4 ID signed off      Acute renal failure ~resolved Hypernatremia~resolved - Trend BMP - Replace electrolytes as indicated - Monitor UOP - Nephrology consulted appreciate input: HD per recommendations ~ currently no HD indicated (temporary HD catheter has been removed) 9/1 sodium level normal Continue free water flushes to 150 every 2 hours 9/4 AKI resolved Avoid nephrotoxic meds     Hyperkalemia K 5.1 , monitor.  Add low potassium diet.  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  Still uremic.  EEG 8/16 neg.  - Avoid sedating medications as able - PT/OT following - Mobilize as able    PICC associated DVT- PICC removed - Continue eliquis    Depression  - Continue lexapro   Left Leg pain PMHx: Sciatica as reported by pt -Pain control -Resume home gabapentin and flexeril L LE venous ultrasound negative            Resolved hospital problem list:  Community-acquired pneumonia: Streptococcus pneumoniae and Haemophilus influenzae ~ TREATED MRSA HCAP ~ TREATED Provetella Sinusitis ~ TREATED   DVT prophylaxis: Eliquis Code Status: Full full Family Communication: None at bedside Disposition Plan:  Status is: Inpatient Remains inpatient appropriate because:unsafe discharge, iv Complex patient.  Will need long-term acute care        LOS: 41 days   Time spent: 35 min    Nolberto Hanlon, MD [facility] Pager [phone]  If 7PM-7AM, please contact night-coverage 03/24/2022, 8:25 AM

## 2022-03-25 ENCOUNTER — Telehealth (HOSPITAL_COMMUNITY): Payer: Self-pay

## 2022-03-25 ENCOUNTER — Other Ambulatory Visit (HOSPITAL_COMMUNITY): Payer: Self-pay

## 2022-03-25 DIAGNOSIS — J9602 Acute respiratory failure with hypercapnia: Secondary | ICD-10-CM | POA: Diagnosis not present

## 2022-03-25 DIAGNOSIS — J9601 Acute respiratory failure with hypoxia: Secondary | ICD-10-CM | POA: Diagnosis not present

## 2022-03-25 DIAGNOSIS — K5900 Constipation, unspecified: Secondary | ICD-10-CM

## 2022-03-25 LAB — CBC
HCT: 30.6 % — ABNORMAL LOW (ref 39.0–52.0)
Hemoglobin: 8.6 g/dL — ABNORMAL LOW (ref 13.0–17.0)
MCH: 26.6 pg (ref 26.0–34.0)
MCHC: 28.1 g/dL — ABNORMAL LOW (ref 30.0–36.0)
MCV: 94.7 fL (ref 80.0–100.0)
Platelets: 286 10*3/uL (ref 150–400)
RBC: 3.23 MIL/uL — ABNORMAL LOW (ref 4.22–5.81)
RDW: 16 % — ABNORMAL HIGH (ref 11.5–15.5)
WBC: 7.1 10*3/uL (ref 4.0–10.5)
nRBC: 0 % (ref 0.0–0.2)

## 2022-03-25 LAB — POTASSIUM: Potassium: 4.8 mmol/L (ref 3.5–5.1)

## 2022-03-25 LAB — GLUCOSE, CAPILLARY
Glucose-Capillary: 76 mg/dL (ref 70–99)
Glucose-Capillary: 130 mg/dL — ABNORMAL HIGH (ref 70–99)
Glucose-Capillary: 107 mg/dL — ABNORMAL HIGH (ref 70–99)
Glucose-Capillary: 122 mg/dL — ABNORMAL HIGH (ref 70–99)

## 2022-03-25 MED ORDER — ESCITALOPRAM OXALATE 10 MG PO TABS
10.0000 mg | ORAL_TABLET | Freq: Every day | ORAL | Status: DC
  Administered 2022-03-25 – 2022-04-07 (×14): 10 mg via ORAL
  Filled 2022-03-25 (×13): qty 1

## 2022-03-25 MED ORDER — APIXABAN 5 MG PO TABS
5.0000 mg | ORAL_TABLET | Freq: Two times a day (BID) | ORAL | Status: DC
  Administered 2022-03-25 – 2022-04-07 (×27): 5 mg via ORAL
  Filled 2022-03-25 (×26): qty 1

## 2022-03-25 MED ORDER — OXYCODONE-ACETAMINOPHEN 5-325 MG PO TABS
1.0000 | ORAL_TABLET | Freq: Four times a day (QID) | ORAL | Status: DC | PRN
  Administered 2022-03-25 – 2022-03-26 (×4): 2 via ORAL
  Filled 2022-03-25 (×4): qty 2

## 2022-03-25 MED ORDER — DOCUSATE SODIUM 100 MG PO CAPS
100.0000 mg | ORAL_CAPSULE | Freq: Two times a day (BID) | ORAL | Status: DC
  Administered 2022-03-25 – 2022-04-05 (×19): 100 mg via ORAL
  Filled 2022-03-25 (×24): qty 1

## 2022-03-25 MED ORDER — GABAPENTIN 600 MG PO TABS
600.0000 mg | ORAL_TABLET | Freq: Two times a day (BID) | ORAL | Status: DC
  Administered 2022-03-25 – 2022-04-07 (×27): 600 mg via ORAL
  Filled 2022-03-25 (×26): qty 1

## 2022-03-25 MED ORDER — CYCLOBENZAPRINE HCL 10 MG PO TABS
10.0000 mg | ORAL_TABLET | Freq: Two times a day (BID) | ORAL | Status: DC | PRN
Start: 2022-03-25 — End: 2022-04-07
  Administered 2022-03-27 – 2022-04-02 (×6): 10 mg via ORAL
  Filled 2022-03-25 (×7): qty 1

## 2022-03-25 MED ORDER — ADULT MULTIVITAMIN W/MINERALS CH
1.0000 | ORAL_TABLET | Freq: Every day | ORAL | Status: DC
  Administered 2022-03-26 – 2022-04-07 (×13): 1 via ORAL
  Filled 2022-03-25 (×13): qty 1

## 2022-03-25 MED ORDER — PANTOPRAZOLE SODIUM 40 MG PO TBEC
40.0000 mg | DELAYED_RELEASE_TABLET | Freq: Every day | ORAL | Status: DC
  Administered 2022-03-25 – 2022-04-07 (×14): 40 mg via ORAL
  Filled 2022-03-25 (×14): qty 1

## 2022-03-25 MED ORDER — POLYETHYLENE GLYCOL 3350 17 G PO PACK
17.0000 g | PACK | Freq: Every day | ORAL | Status: DC
  Administered 2022-03-25 – 2022-04-05 (×7): 17 g via ORAL
  Filled 2022-03-25 (×12): qty 1

## 2022-03-25 MED ORDER — CARVEDILOL 6.25 MG PO TABS
6.2500 mg | ORAL_TABLET | Freq: Two times a day (BID) | ORAL | Status: DC
  Administered 2022-03-25 – 2022-04-07 (×26): 6.25 mg via ORAL
  Filled 2022-03-25 (×26): qty 1

## 2022-03-25 MED ORDER — ENSURE ENLIVE PO LIQD
237.0000 mL | Freq: Three times a day (TID) | ORAL | Status: DC
  Administered 2022-03-26 – 2022-04-02 (×21): 237 mL via ORAL

## 2022-03-25 NOTE — Progress Notes (Signed)
PROGRESS NOTE    Samuel Chavez  RXY:[id number] DOB: 1975-08-26 DOA: 02/11/2022 PCP: [facility]    Brief Narrative:  46 y.o male with significant PMH as below who presented to the ED with chief complaints of SOB and worsening bilateral lower extremities edema.   ED Course: In the emergency department, the temperature was 37.3C, the heart rate 99 beats/minute, the blood pressure 187/102 mm Hg, the respiratory rate 20 breaths/minute, and the oxygen saturation 89% on. He was noted to be drowsy but still protecting his airway and responding appropriately.       ---7/25: Admitted to hospitalist service w/acute on chronic hypoxic hypercapnic resp. failure requiring BiPAP.Failed BiPAP and intubated. PCCM consulted 7/26: INTUBATED, difficlut to sedate, started [place] 7/27: severe hypoxia, PEEP increased to 15 but decreased back to 10 7/28: Persistent severe hypoxia, ventilator changes made 7/29: Persistent hypoxia, recruitment maneuvers instituted, ventilator changes made, empiric heparin as patient cannot be scanned query PE -02/17/22- patient is severely critically ill with bilateral multifocal pneumonia on sedation and paralysis with mechanical ventilation maximal settings.  He is at cusp of death, we met with daughter today and discussed severity of illness. Unable to perform CT due to severity of critical illness. Met with previous PCCM doc and discussed medical plan.  Patient had recruitment maneuvers last few days without improvement, on heparin gtt for possible PE.  02/18/22- patient continues to require maximal settings on ventilator despite good UOP.  He destaturated to <50% spO2 on MV today required BGV.   02/19/22- patient continues to require 100% FiO2 on maximal setting with high PEEP ladder for possible ARDS.  He was diuresed and on steroids with development of AKI.  His CXR had slight interval improvement on right. We discussed case with vascular surgery regarding  possible empiric tPA for PE.  02/20/22- patient was unable to get CT today due to continued severe hypoxemia.  Daughter and other family members at bedside we reviewed case and medical plan.  There seems to be some confusion from family as they had asked me to wake patient up and take off ventilator even though I had explained multiple times that he is at very high risk for death.  They laughed at my comments and seemed to think it was not true. We may still be able to do bronchoscopy but its high risk.  We have reduced IV infusions by switching some medications to OGT route.  02/21/22- patient is weaned to 60%FiO2.  Plan for possible bronch if patient is tolerating.  Family at bedside.  If able to we will obtain more imaging and VQ scan to rule out PE.  02/22/22- Patient weaned to 50% on PRVC.  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 CBC stable , CMP with improved GFR. Monitoring Na, pharmacy and renal following for electrolytes/renal function.  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  Overall marked improvement on ventilator over past 48h. Met with daughter at bedside reviewed medical plan.  02/24/22-Vent requirements continue to slowly improve, currently 40% FiO2 and 14 PEEP.  AKI continues to slowly improve, remains hyperkalemic with potassium of 5.8.  Placed back on [place], Diurese x1.  Trach scheduled for tomorrow at 1:15 PM 02/25/22- Diurese with Lasix x1 dose. Trach scheduled for today. 02/26/22: Remains mechanically ventilated via newly placed tracheostomy will attempt to wean ventilator settings today.  Perform WUA  02/26/22: Pt remains mechanically ventilated  via tracheostomy vent settings: PEEP 14/FiO2 80% 02/27/22: Fevers, ID consulted. Repeating Tracheal aspirate and UA.  Zosyn started 02/28/22: Persistent fevers, Tracheal aspirate with Staph aureus, start Linezolid.  Worsening Creatinine, considering Lasix  gtt given high vent requirements (100% FiO2, 14 peep).  D/c fentanyl gtt and decrease oral benzos/narcotics 03/01/22: worsening anuric renal failure. HD line placed and started on hemodialysis. 03/02/22: Vent support slowly improving. Plan for HD again today. 8/14-8/15: remains febrile, encephalopathic despite HD sessions 8/16 fever curve improved, clots noted in LUE 8/18: continued low grade fever (t max 100.6).  Receiving HD.  Slight improvement in encephalopathy 8/19 off pressors, off sedation, remains on vent  8/20: Afebrile, tolerating PSV 8/21: Remains afebrile, obtain CT Abdomen for IR to evaluate for PEG placement.  Tolerating PSV (15/5).  Consult PT for passive ROM 8/22: Extremely weak, but able to MAE to command, nodding to questions. Tolerating PSV, will trial TC. Deemed not candidate for IR placement of PEG, will have General Surgery evaluate 8/23: No acute events overnight tolerating SBT 10/5 will attempt to transition to TCT today  8/24: No acute events overnight continues to tolerate SBT, will attempt TCT as tolerated  8/25: Tolerating TC during day and vent at night.  Strength improving.  Robotic assisted laparoscopic gastrostomy tube placement by General Surgery, heparin currently on hold for procedure. 8/26: tolerating vent at night, TC during day.  Awaiting Surgery approval to restart Heparin gtt today. Na increased to 149 as unable to use G-tube till tomorrow for free water flushes.  Will start low dose D5W infusion 8/27: Tolerated TC all day and throughout night yesterday, General Surgery approves use of G-tube for meds and feeds.  Transition Heparin gtt to Eliquis.  Resume feeds and free water flushes 8/28: Pt placed back on ventilator overnight due to hypoxia vent settings SBT 10/10.  Tolerate trach collar and  PMV  8/29: No acute events overnight tolerated mechanical ventilation overnight; Will transition to trach collar  8/30: No significant events overnight. Tolerating TC.   Passed speech evaluation 8/31: Tolerating TC, Passy Muir valve and diet.  Complains of left leg pain, resume home Gabapentin and flexeril, obtain venous US LLE. 9/1 TRH pickup  9/2 no issues today. Feels fine. No sob or cp. K 5.3 9/3 Pt tolerating feeding. Ask RD to see if still needs TF.  9/4 RD managing TF. D/w pt importance of nutrition for infection/wound healing 9/5Ate fried chicken. No issues.    Consultants:  PCCM Nephrology Vascular Surgery Palliative Care ENT Infectious Disease General Surgery     Pccm, nephrology, general surgery  Procedures:  7/25: Intubation 7/26: PICC triple-lumen, right basilic vein 9/38: Left radial arterial line  8/08: Size 8 proximal XLT Shiley inserted 8/12: Right IJ HD catheter placed 8/25: Robotic assisted laparoscopic gastrostomy tube placement by General Surgery    7/25  Chest Xray:Chest x-ray showed cardiomegaly and pulmonary vascular congestion without overt pulmonary edema 7/25 Echocardiogram: difficult study, LVEF was estimated at 55 to 60%, dilated LV moderate LVH, grade 1 DD, enlargement of the right ventricle, left atrial size moderately dilated, right atrial size moderately dilated this is consistent with restrictive physiology (echo reading not congruous with prior echoes from [facility]) 7/29: Venous US BLE>>IMPRESSION: No lower extremity DVT. 7/30 Limited echo: LVEF 40 to 45% decreased LV function, no wall motion abnormalities, moderate dilation of the LV concentric LVH, right ventricular size moderately enlarged, right atrial enlargement, this is more consistent with prior [facility] scans 8/4: CT Head>>No acute  intracranial abnormality. Paranasal sinusitis. Bilateral complete opacification of the mastoid air cells and inner ears, as can be seen in the setting of otitis media. Correlate with symptoms. 8/4: CT Chest/Abdomen/Pelvis>>Extensive ground-glass, alveolar and patchy dense infiltrates in both lungs  suggesting multifocal pneumonia. Part of this finding may suggest underlying pulmonary edema. Small bilateral pleural effusions are seen. Cardiomegaly. There is ectasia of the main pulmonary artery suggesting pulmonary arterial hypertension. There is no evidence of intestinal obstruction or pneumoperitoneum. There is no hydronephrosis. Appendix is not dilated. UB diverticula are seen in the colon without signs of focal diverticulitis. Severe degenerative changes are noted at L4-L5 level with significant interval worsening. Findings may be due to severe disc degeneration. If there is clinical suspicion for discitis or osteomyelitis, follow-up MRI may be considered 8/4: Lung V/Q>>Pulmonary embolism absent. 8/15: Venous US BLE>>No evidence of deep venous thrombosis in either lower extremity 8/16: Venous US BLE>>1. Non-occlusive/nearly occlusive thrombus in one of paired right upper arm brachial veins containing the indwelling PICC line and occlusive thrombus in the right axillary vein. The visualized segment of the right subclavian vein is patent. No evidence of left upper extremity DVT. 8/31: Venous US LLE>>negative      Antimicrobials:  7/25: SARS-CoV-2 PCR> negative 7/25: MRSA PCR>> NEG 7/27: Strep pneumoniae Ag>> negative 7/27: Legionella Ag>> negative 7/27: Mycoplasma>> <770 7/27: Sputum>> Strep pneumo,Haemophilus influenzae 7/30 Sputum cult >> negative 8/4: Tracheal aspirate>>negative 8/9: Tracheal aspirate>>MRSA 8/10: RVP>>negative 8/10: Right nasal Wound culture>>STAPHYLOCOCCUS AUREUS, PROVOTELLA 8/10: MRSA PCR>> Positive 8/11: Blood culture 1/4>>Staphylococcus epidermidis MecA/C (suspect contaminant)     ABX: Cefepime 7/27 >>7/31 Ceftriaxone 7/31>>8/4 Vancomycin 7/27 >> 7/29, restarted 7/30 (resumed due to fever spike on Maxipime) ? Resistant Strep pneumo >> 7/31 Unasyn 8/14>>8/17 Augmentin 8/17>>8/21 Linezolid 8/11>>8/20     Subjective: Son at bedside.  Denies any  shortness of breath, chest pain.  Has not had a bowel movement in 5 days feels fullness.  No nausea or vomiting  Objective: Vitals:   03/24/22 2016 03/24/22 2344 03/25/22 0925 03/25/22 1226  BP: (!) 129/100 (!) 162/84 (!) 150/95 130/73  Pulse: 90 88 86 98  Resp: _0 Temp: 98.7 F (37.1 C) 98.8 F (37.1 C) 98 F (36.7 C) 98.3 F (36.8 C)  TempSrc: Oral Oral    SpO2: 98% 100% 100% 99%  Weight:      Height:        Intake/Output Summary (Last 24 hours) at 03/25/2022 1414 Last data filed at 03/25/2022 0314 Gross per 24 hour  Intake 230 ml  Output 4600 ml  Net -4370 ml   Filed Weights   03/24/22 1508 03/24/22 1714  Weight: (!) 182.8 kg (!) 191 kg    Examination: Calm, NAD Decrease bs , no wheezing TC in place Reg s1/s2 no gallop Soft benign +bs mildly distended Decreasing edema.  Aaoxox3  Mood and affect appropriate in current setting     Data Reviewed: I have personally reviewed following labs and imaging studies  CBC: Recent Labs  Lab 03/19/22 0425 03/20/22 0639 03/21/22 0530 03/25/22 1011  WBC 6.9 6.6 6.4 7.1  NEUTROABS 4.7 4.4 3.9  --   HGB 8.4* 8.2* 8.4* 8.6*  HCT 29.4* 28.8* 30.2* 30.6*  MCV 94.5 94.4 95.9 94.7  PLT 269 262 308 973   Basic Metabolic Panel: Recent Labs  Lab 03/19/22 0425 03/20/22 0639 03/21/22 0530 03/22/22 0915 03/23/22 0916 03/23/22 1803 03/24/22 0706 03/25/22 1011  NA 143 139 140  --  138  --   --   --  K 3.8 4.2 4.9 5.3* 5.3* 4.8 5.1 4.8  CL 109 105 104  --  103  --   --   --   CO2 30 28 32  --  31  --   --   --   GLUCOSE 146* 139* 102*  --  128*  --   --   --   BUN 37* 35* 30*  --  30*  --   --   --   CREATININE 0.85 0.78 0.72  --  0.68  --   --   --   CALCIUM 7.9* 8.0* 8.2*  --  8.3*  --   --   --   MG 1.5* 1.6* 1.7  --  1.3*  --  1.6*  --   PHOS 2.4* 3.2 3.7  --  2.5  --   --   --    GFR: Estimated Creatinine Clearance: 198 mL/min (by C-G formula based on SCr of 0.68 mg/dL). Liver Function Tests: Recent  Labs  Lab 03/19/22 0425 03/20/22 0639 03/21/22 0530  ALBUMIN 2.4* 2.5* 2.7*   No results for input(s): "LIPASE", "AMYLASE" in the last 168 hours. No results for input(s): "AMMONIA" in the last 168 hours. Coagulation Profile: No results for input(s): "INR", "PROTIME" in the last 168 hours. Cardiac Enzymes: No results for input(s): "CKTOTAL", "CKMB", "CKMBINDEX", "TROPONINI" in the last 168 hours. BNP (last 3 results) No results for input(s): "PROBNP" in the last 8760 hours. HbA1C: No results for input(s): "HGBA1C" in the last 72 hours. CBG: Recent Labs  Lab 03/24/22 1135 03/24/22 1613 03/24/22 2016 03/25/22 0839 03/25/22 1143  GLUCAP 147* 141* 127* 76 130*   Lipid Profile: No results for input(s): "CHOL", "HDL", "LDLCALC", "TRIG", "CHOLHDL", "LDLDIRECT" in the last 72 hours. Thyroid Function Tests: No results for input(s): "TSH", "T4TOTAL", "FREET4", "T3FREE", "THYROIDAB" in the last 72 hours. Anemia Panel: No results for input(s): "VITAMINB12", "FOLATE", "FERRITIN", "TIBC", "IRON", "RETICCTPCT" in the last 72 hours. Sepsis Labs: No results for input(s): "PROCALCITON", "LATICACIDVEN" in the last 168 hours.  No results found for this or any previous visit (from the past 240 hour(s)).       Radiology Studies: No results found.      Scheduled Meds:  apixaban  5 mg Oral BID   carvedilol  6.25 mg Oral BID WC   Chlorhexidine Gluconate Cloth  6 each Topical Q0600   docusate sodium  100 mg Oral BID   escitalopram  10 mg Oral Daily   feeding supplement (PROSource TF20)  60 mL Per Tube QID   free water  30 mL Per Tube QID   gabapentin  600 mg Oral BID   insulin aspart  0-20 Units Subcutaneous TID AC   insulin detemir  22 Units Subcutaneous BID   ipratropium-albuterol  3 mL Nebulization Q6H   lidocaine  1 patch Transdermal Q24H   mupirocin ointment   Nasal BID   nutrition supplement (JUVEN)  1 packet Per Tube BID BM   pantoprazole  40 mg Oral Daily   polyethylene  glycol  17 g Oral Daily   Continuous Infusions:  sodium chloride 10 mL/hr at 03/14/22 1036    Assessment & Plan:   Principal Problem:   Acute respiratory failure with hypoxia and hypercarbia (HCC) Active Problems:   Acute on chronic systolic CHF (congestive heart failure) (HCC)   Hypertensive urgency   Type 2 diabetes mellitus with peripheral neuropathy (HCC)   Acute heart failure with preserved ejection fraction ([place])  Renal failure (ARF), acute on chronic (HCC)   Hospital-acquired pneumonia   ARDS (adult respiratory distress syndrome) (HCC)   Pressure injury of skin   Constipation    Acute on chronic hypoxemic and hypercapneic respiratory failure now s/p tracheostomy PMHx: OSA/OHS - Full vent support, implement lung protective strategies - Plateau pressures less than 30 cm H20 - Wean FiO2 & PEEP as tolerated to maintain O2 sats >92% - Follow intermittent Chest X-ray & ABG as needed - SBT vs TCT as tolerated  - TCT during the day as tolerated and nocturnal ventilation  - Implement VAP Bundle 9/4 continue inhalers      MRSA HCAP- completed linezolid x 7 days Provetella sinusitis- completed unasyn/augmentin Persistent low grade fever, suspect due to RUE DVT ~  -Resolved Monitor fever curve 9/5 ID signed off     Acute renal failure ~resolved Hypernatremia~resolved - Trend BMP - Replace electrolytes as indicated - Monitor UOP - Nephrology consulted appreciate input: HD per recommendations ~ currently no HD indicated (temporary HD catheter has been removed) 9/1 sodium level normal Continue free water flushes to 150 every 2 hours 9/5 AKI resolved  Avoid nephrotoxic meds    Constipation Start standing bowel regimen    Hyperkalemia Add low potassium diet.  K stable    Hypomagnesium Replaced and monitor    DM2 with hyperglycemia - CBG's q4h; Target range of 140 to 180 BG stable  Continue R-ISS  Nutrition- was on TF. Eating now.Per surgery gastric  tube sutures taken off. do NOT recommend removing the tube yet since the track hasnt matured yet.  Give it another 2-3 weeks before considering removal.      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  Still uremic.  EEG 8/16 neg.  - Avoid sedating medications as able - PT/OT following - Mobilize as able   PICC associated DVT- PICC removed - Continue eliquis    Depression  - Continue lexapro   Left Leg pain PMHx: Sciatica as reported by pt -Pain control -Resume home gabapentin and flexeril L LE venous ultrasound negative            Resolved hospital problem list:  Community-acquired pneumonia: Streptococcus pneumoniae and Haemophilus influenzae ~ TREATED MRSA HCAP ~ TREATED Provetella Sinusitis ~ TREATED   DVT prophylaxis: Eliquis Code Status: Full  Family Communication: None at bedside Disposition Plan: LTAC Status is: Inpatient Remains inpatient appropriate because:unsafe discharge, Will need long-term acute care        LOS: 42 days   Time spent: 35 min    Nolberto Hanlon, MD [facility] Pager [phone]  If 7PM-7AM, please contact night-coverage 03/25/2022, 2:14 PM

## 2022-03-25 NOTE — Progress Notes (Signed)
Calorie Count Day 1  Estimated Nutritional Needs:    Kcal:  2700-3000kcal/day Protein:  >150g/day Fluid:  2.0L/day  Lunch 9/4: 99% of hamburger, 100% chips, 100% fruit cup x 2, 100% pepsi  Total- 725kcal and 32g protein   Dinner 9/4: 95% mashed potatoes, 75% green peas, 100% baked chicken, 100% fruit ice, 75% cola  Total- 564kcal and 27g protein   Breakfast 9/5: 100% pancakes, scrambled eggs, pork sausage, 75% milk, 100% juice  Total- 695kcal and 27g protein   Total Intake:  1984kcal (73% estimated needs) and  86g protein (57% estimated needs)   Koleen Distance MS, RD, LDN Please refer to [facility] for RD and/or RD on-call/weekend/after hours pager

## 2022-03-25 NOTE — Telephone Encounter (Signed)
Pharmacy Patient Advocate Encounter  Insurance verification completed.    The patient is insured through [employer]   The patient is currently admitted and ran test claims for the following: Eliquis 5mg .  Copays and coinsurance results were relayed to Inpatient clinical team.

## 2022-03-25 NOTE — Progress Notes (Signed)
Physical Therapy Treatment Patient Details Name: Samuel Chavez MRN: [id number] DOB: 10/31/1975 Today's Date: 03/25/2022   History of Present Illness Pt is 89 YOM admitted for PNA, renail failure, AKI, hyperkalemia, CHF, Resp failure, sepsis, and is s/p prolonged ventilation, trach placement. PMH includes: DM, HTN, sCHF, OSA, obesity, ARDS Resp failure, sepsis.    PT Comments    Patient reports having a stomach ache today and repeatedly states "I just need a day off." Encouraged patient and offered multiple times to assist with sitting up on edge of bed, however he declined. He wanted to get on the bed pan. +2 person assistance required for rolling to right with verbal cues for technique. The patient requested for therapist to leave and wanted to call for assistance from nursing staff when finished on the bed pan. Slow overall progress. PT will continue to follow to maximize independence and decrease caregiver burden.    Recommendations for follow up therapy are one component of a multi-disciplinary discharge planning process, led by the attending physician.  Recommendations may be updated based on patient status, additional functional criteria and insurance authorization.  Follow Up Recommendations  PT at Long-term acute care hospital Can patient physically be transported by private vehicle: No   Assistance Recommended at Discharge Frequent or constant Supervision/Assistance  Patient can return home with the following Two people to help with walking and/or transfers;Two people to help with bathing/dressing/bathroom;Direct supervision/assist for medications management;Help with stairs or ramp for entrance;Assist for transportation;Assistance with feeding;Assistance with cooking/housework;Direct supervision/assist for financial management   Equipment Recommendations  None recommended by PT    Recommendations for Other Services       Precautions / Restrictions Precautions Precautions:  Fall Restrictions Weight Bearing Restrictions: No Other Position/Activity Restrictions: HOB 30 deg     Mobility  Bed Mobility Overal bed mobility: Needs Assistance   Rolling: Max assist, +2 for physical assistance         General bed mobility comments: verbal cues for sequencing and technique. faciliation for reaching across midline with LUE to faciliate rolling to right using bed rail. increased time and effort required    Transfers                   General transfer comment: patient refused to sit upright today, stating he needs " a day off" despite encouragement to participate    Ambulation/Gait                   Stairs             Wheelchair Mobility    Modified Rankin (Stroke Patients Only)       Balance                                            Cognition Arousal/Alertness: Awake/alert Behavior During Therapy: WFL for tasks assessed/performed Overall Cognitive Status: Within Functional Limits for tasks assessed                                 General Comments: PMSV in place. patient is able to follow commands consistently. needs motivation to participate        Exercises      General Comments General comments (skin integrity, edema, etc.): patient requesting to get on the bed pan for a bowel movement.  he reports he is having stomach pain with no recent bowel movement. patient requesting for therapist to leave the room and patient to call for assistance from nursing staff to get off the bed pan.      Pertinent Vitals/Pain Pain Assessment Pain Assessment: Faces Faces Pain Scale: Hurts a little bit Pain Location: stomach Pain Descriptors / Indicators: Discomfort Pain Intervention(s): Limited activity within patient's tolerance    Home Living                          Prior Function            PT Goals (current goals can now be found in the care plan section) Acute Rehab PT  Goals PT Goal Formulation: With patient Progress towards PT goals: Progressing toward goals    Frequency    Min 2X/week      PT Plan Current plan remains appropriate    Co-evaluation              AM-PAC PT "6 Clicks" Mobility   Outcome Measure  Help needed turning from your back to your side while in a flat bed without using bedrails?: Total Help needed moving from lying on your back to sitting on the side of a flat bed without using bedrails?: Total Help needed moving to and from a bed to a chair (including a wheelchair)?: Total Help needed standing up from a chair using your arms (e.g., wheelchair or bedside chair)?: Total Help needed to walk in hospital room?: Total Help needed climbing 3-5 steps with a railing? : Total 6 Click Score: 6    End of Session Equipment Utilized During Treatment:  (trach mask with oxygen, PMSV in place throughout) Activity Tolerance:  (limited by williness to progress activity further) Patient left: in bed;with call bell/phone within reach;with family/visitor present (son in the room) Nurse Communication: Mobility status (alerted nurse tech patient on bed pan) PT Visit Diagnosis: Muscle weakness (generalized) (M62.81);Other abnormalities of gait and mobility (R26.89)     Time: 7209-4709 PT Time Calculation (min) (ACUTE ONLY): 14 min  Charges:  $Therapeutic Activity: 8-22 mins                     [name], PT, MPT    [name] 03/25/2022, 11:49 AM

## 2022-03-25 NOTE — Progress Notes (Signed)
OT Cancellation Note  Patient Details Name: TABIAS SWAYZE MRN: [id number] DOB: 1975/09/14   Cancelled Treatment:    Reason Eval/Treat Not Completed: Patient declined, no reason specified. Upon arrival PT attempting to engage pt in therapy. Offered encouragement and explanation on importance of mobility for functional strengthening. Pt refused stating"I need a day off' and "my son can help me sit up." Educated on importance of staff for all mobility.   Dessie Coma, M.S. OTR/L  03/25/22, 12:53 PM  ascom [phone]

## 2022-03-25 NOTE — TOC Benefit Eligibility Note (Signed)
Patient Teacher, English as a foreign language completed.    The patient is currently admitted and upon discharge could be taking Eliquis 5mg .  The current 30 day co-pay is $4.00.   The patient is insured through [place] [place] Patient Advocate Specialist [place] Patient Advocate Team Direct Number: [phone]  Fax: [phone]

## 2022-03-25 NOTE — Progress Notes (Signed)
Subjective:  CC: Samuel Chavez is a 46 y.o. male  Hospital stay day 1, 11 Days Post-Op robotic assisted gastrostomy tube placement  HPI: No acute issues.  Eating now, havent had to use tube recently.  ROS:  General: Denies weight loss, weight gain, fatigue, fevers, chills, and night sweats. Heart: Denies chest pain, palpitations, racing heart, irregular heartbeat, leg pain or swelling, and decreased activity tolerance. Respiratory: Denies breathing difficulty, shortness of breath, wheezing, cough, and sputum. GI: Denies change in appetite, heartburn, nausea, vomiting, constipation, diarrhea, and blood in stool. GU: Denies difficulty urinating, pain with urinating, urgency, frequency, blood in urine.   Objective:   Temp:  [98 F (36.7 C)-98.9 F (37.2 C)] 98 F (36.7 C) (09/05 0925) Pulse Rate:  [86-90] 86 (09/05 0925) Resp:  [16-18] 16 (09/05 0925) BP: (129-162)/(78-100) 150/95 (09/05 0925) SpO2:  [95 %-100 %] 100 % (09/05 0925) FiO2 (%):  [28 %] 28 % (09/04 1835) Weight:  [182.8 kg-191 kg] 191 kg (09/04 1714)     Height: 5\' 9"  (175.3 cm) Weight: (!) 191 kg BMI (Calculated): 62.14   Intake/Output this shift:   Intake/Output Summary (Last 24 hours) at 03/25/2022 0959 Last data filed at 03/25/2022 0314 Gross per 24 hour  Intake 590 ml  Output 5750 ml  Net -5160 ml    Constitutional :  alert, cooperative, appears stated age, and no distress  Respiratory:  clear to auscultation bilaterally  Cardiovascular:  regular rate and rhythm  Gastrointestinal: soft, non-tender; bowel sounds normal; no masses,  no organomegaly.  Feeding tube intact, no issues.  Skin: Cool and moist. Gastrostomy site with minimal exudate.   Psychiatric: Normal affect, non-agitated, not confused       LABS:     Latest Ref Rng & Units 03/24/2022    7:06 AM 03/23/2022    6:03 PM 03/23/2022    9:16 AM  CMP  Glucose 70 - 99 mg/dL   128   BUN 6 - 20 mg/dL   30   Creatinine 0.61 - 1.24 mg/dL   0.68   Sodium  135 - 145 mmol/L   138   Potassium 3.5 - 5.1 mmol/L 5.1  4.8  5.3   Chloride 98 - 111 mmol/L   103   CO2 22 - 32 mmol/L   31   Calcium 8.9 - 10.3 mg/dL   8.3       Latest Ref Rng & Units 03/21/2022    5:30 AM 03/20/2022    6:39 AM 03/19/2022    4:25 AM  CBC  WBC 4.0 - 10.5 K/uL 6.4  6.6  6.9   Hemoglobin 13.0 - 17.0 g/dL 8.4  8.2  8.4   Hematocrit 39.0 - 52.0 % 30.2  28.8  29.4   Platelets 150 - 400 K/uL 308  262  269     RADS: N/a Assessment:   S/p robotic assisted laparoscopic gastrostomy tube placement.  No issues.   Tube bumper sutures removed at bedside.  Skin underlying bumper clean, no signs of infection.  Drain sponge placed between bumeper and skin.    Recommended keeping tube in for 2-3 more weeks to allow track to mature prior to removal, since early removal can potentially cause stomach to fall away from wall, cause acute peritonitis from stomach contents leaking into abdomen. Call surgery for any questions.   labs/images/medications/previous chart entries reviewed personally and relevant changes/updates noted above.

## 2022-03-25 NOTE — Plan of Care (Signed)
  Problem: Education: Goal: Ability to demonstrate management of disease process will improve Outcome: Progressing Goal: Ability to verbalize understanding of medication therapies will improve Outcome: Progressing   Problem: Activity: Goal: Capacity to carry out activities will improve Outcome: Progressing   Problem: Cardiac: Goal: Ability to achieve and maintain adequate cardiopulmonary perfusion will improve Outcome: Progressing   Problem: Education: Goal: Knowledge of General Education information will improve Description: Including pain rating scale, medication(s)/side effects and non-pharmacologic comfort measures Outcome: Progressing   Problem: Health Behavior/Discharge Planning: Goal: Ability to manage health-related needs will improve Outcome: Progressing

## 2022-03-26 ENCOUNTER — Other Ambulatory Visit (HOSPITAL_COMMUNITY): Payer: Self-pay

## 2022-03-26 ENCOUNTER — Telehealth (HOSPITAL_COMMUNITY): Payer: Self-pay | Admitting: Pharmacy Technician

## 2022-03-26 DIAGNOSIS — E1169 Type 2 diabetes mellitus with other specified complication: Secondary | ICD-10-CM

## 2022-03-26 DIAGNOSIS — J9601 Acute respiratory failure with hypoxia: Secondary | ICD-10-CM | POA: Diagnosis not present

## 2022-03-26 DIAGNOSIS — J9602 Acute respiratory failure with hypercapnia: Secondary | ICD-10-CM | POA: Diagnosis not present

## 2022-03-26 LAB — GLUCOSE, CAPILLARY
Glucose-Capillary: 123 mg/dL — ABNORMAL HIGH (ref 70–99)
Glucose-Capillary: 141 mg/dL — ABNORMAL HIGH (ref 70–99)
Glucose-Capillary: 137 mg/dL — ABNORMAL HIGH (ref 70–99)
Glucose-Capillary: 135 mg/dL — ABNORMAL HIGH (ref 70–99)

## 2022-03-26 MED ORDER — FREE WATER
30.0000 mL | Freq: Two times a day (BID) | Status: DC
  Administered 2022-03-26 – 2022-04-06 (×16): 30 mL

## 2022-03-26 MED ORDER — LACTULOSE 10 GM/15ML PO SOLN
20.0000 g | Freq: Two times a day (BID) | ORAL | Status: DC | PRN
  Administered 2022-03-28: 20 g via ORAL
  Filled 2022-03-26 (×2): qty 30

## 2022-03-26 MED ORDER — DIPHENHYDRAMINE HCL 25 MG PO CAPS
25.0000 mg | ORAL_CAPSULE | Freq: Four times a day (QID) | ORAL | Status: DC | PRN
Start: 2022-03-26 — End: 2022-04-07
  Administered 2022-03-26 – 2022-04-06 (×17): 25 mg via ORAL
  Filled 2022-03-26 (×18): qty 1

## 2022-03-26 MED ORDER — JUVEN PO PACK
1.0000 | PACK | Freq: Two times a day (BID) | ORAL | Status: DC
  Administered 2022-03-27 – 2022-04-06 (×17): 1 via ORAL

## 2022-03-26 MED ORDER — OXYCODONE HCL 5 MG PO TABS
10.0000 mg | ORAL_TABLET | ORAL | Status: DC | PRN
  Administered 2022-03-26 – 2022-04-07 (×33): 10 mg via ORAL
  Filled 2022-03-26 (×36): qty 2

## 2022-03-26 NOTE — Progress Notes (Signed)
Pt transported to Room 240 without incident. Pt placed back on 28% ATC, dressing changed PMV in place.

## 2022-03-26 NOTE — Progress Notes (Signed)
PROGRESS NOTE    Samuel Chavez  ZOX:[id number] DOB: 21-Nov-1975 DOA: 02/11/2022 PCP: [facility]  Assessment & Plan:   Principal Problem:   Acute respiratory failure with hypoxia and hypercarbia ([place]) Active Problems:   Acute on chronic systolic CHF (congestive heart failure) (HCC)   Hypertensive urgency   Type 2 diabetes mellitus with peripheral neuropathy (HCC)   Acute heart failure with preserved ejection fraction (HCC)   Renal failure (ARF), acute on chronic (HCC)   Hospital-acquired pneumonia   ARDS (adult respiratory distress syndrome) (HCC)   Pressure injury of skin   Constipation  Assessment and Plan: Acute on chronic hypoxemic and hypercapneic respiratory failure: s/p tracheostomy. Hx of OSA/OHS. Continue w/ trach collar and supplemental oxygen. Continue w/ vent qhs    MRSA HCAP: completed linezolid course.  Provetella sinusitis & has completed unasyn/augmentin. ID signed off   AKI: resolved   Hypernatremia: WNL. Continue w/ free water flushes   Constipation: continue on miralax   Hyperkalemia: WNL today   Hypomagnesemia: will continue to monitor intermittently    DM2: likely poorly controlled. Continue on levemir, SSI w/ accuchecks. Tube feeds have been d/c. Per surgery, gastric tube sutures taken off & do NOT recommend removing the tube yet since the track hasnt matured yet.  Give it another 2-3 weeks before considering removal as per gen surg    Normocytic anemia: H&H are labile. 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 EEG 8/16 neg. PT recs LTAC   PICC associated DVT: PICC removed. Continue on eliquis   Depression: severity unknown. Continue on lexapro   Left weg pain: w/ hx of sciatica as per pt. Continue on gabapentin, flexeril. LLE venous ultrasound negative   Morbidly obese: BMI 62.1. Complicates overall care & prognosis     DVT prophylaxis:  eliquis  Code Status: full  Family Communication: discussed pt's care w/ pt's family at bedside and answered their questions  Disposition Plan: possibly d/c to LTAC   Level of care: Progressive  Status is: Inpatient Remains inpatient appropriate because: severity of illness   Consultants:  ICU Gen surg  Nephro  ID  Cardio   Procedures:   Antimicrobials:    Subjective: Pt c/o itching   Objective: Vitals:   03/26/22 0220 03/26/22 0319 03/26/22 0749 03/26/22 0833  BP:  (!) 147/91  (!) 146/102  Pulse:  87  91  Resp:  16    Temp:  99 F (37.2 C)    TempSrc:  Oral    SpO2: 99% 100% 98% 100%  Weight:      Height:        Intake/Output Summary (Last 24 hours) at 03/26/2022 0901 Last data filed at 03/26/2022 0314 Gross per 24 hour  Intake --  Output 2450 ml  Net -2450 ml   Filed Weights   03/24/22 1508 03/24/22 1714  Weight: (!) 182.8 kg (!) 191 kg    Examination:  General exam: Appears calm but uncomfortable. Morbidly obese Respiratory system: diminished breath sounds b/l. Cardiovascular system: S1 & S2 +. No rubs, gallops or clicks.  Gastrointestinal system: Abdomen is obese, soft and nontender. Hypoactive bowel sounds heard. Central nervous system: Alert and oriented. Moves all extremities. Psychiatry: Judgement and insight appear normal. Flat mood and affect     Data Reviewed: I have personally reviewed following labs and imaging studies  CBC: Recent Labs  Lab 03/20/22 0639 03/21/22 0530 03/25/22 1011  WBC 6.6  6.4 7.1  NEUTROABS 4.4 3.9  --   HGB 8.2* 8.4* 8.6*  HCT 28.8* 30.2* 30.6*  MCV 94.4 95.9 94.7  PLT 262 308 811   Basic Metabolic Panel: Recent Labs  Lab 03/20/22 0639 03/21/22 0530 03/22/22 0915 03/23/22 0916 03/23/22 1803 03/24/22 0706 03/25/22 1011  NA 139 140  --  138  --   --   --   K 4.2 4.9 5.3* 5.3* 4.8 5.1 4.8  CL 105 104  --  103  --   --   --   CO2 28 32  --  31  --   --   --   GLUCOSE 139* 102*  --  128*  --   --   --    BUN 35* 30*  --  30*  --   --   --   CREATININE 0.78 0.72  --  0.68  --   --   --   CALCIUM 8.0* 8.2*  --  8.3*  --   --   --   MG 1.6* 1.7  --  1.3*  --  1.6*  --   PHOS 3.2 3.7  --  2.5  --   --   --    GFR: Estimated Creatinine Clearance: 198 mL/min (by C-G formula based on SCr of 0.68 mg/dL). Liver Function Tests: Recent Labs  Lab 03/20/22 0639 03/21/22 0530  ALBUMIN 2.5* 2.7*   No results for input(s): "LIPASE", "AMYLASE" in the last 168 hours. No results for input(s): "AMMONIA" in the last 168 hours. Coagulation Profile: No results for input(s): "INR", "PROTIME" in the last 168 hours. Cardiac Enzymes: No results for input(s): "CKTOTAL", "CKMB", "CKMBINDEX", "TROPONINI" in the last 168 hours. BNP (last 3 results) No results for input(s): "PROBNP" in the last 8760 hours. HbA1C: No results for input(s): "HGBA1C" in the last 72 hours. CBG: Recent Labs  Lab 03/25/22 0839 03/25/22 1143 03/25/22 1634 03/25/22 2218 03/26/22 0832  GLUCAP 76 130* 107* 122* 123*   Lipid Profile: No results for input(s): "CHOL", "HDL", "LDLCALC", "TRIG", "CHOLHDL", "LDLDIRECT" in the last 72 hours. Thyroid Function Tests: No results for input(s): "TSH", "T4TOTAL", "FREET4", "T3FREE", "THYROIDAB" in the last 72 hours. Anemia Panel: No results for input(s): "VITAMINB12", "FOLATE", "FERRITIN", "TIBC", "IRON", "RETICCTPCT" in the last 72 hours. Sepsis Labs: No results for input(s): "PROCALCITON", "LATICACIDVEN" in the last 168 hours.  No results found for this or any previous visit (from the past 240 hour(s)).       Radiology Studies: No results found.      Scheduled Meds:  apixaban  5 mg Oral BID   carvedilol  6.25 mg Oral BID WC   Chlorhexidine Gluconate Cloth  6 each Topical Q0600   docusate sodium  100 mg Oral BID   escitalopram  10 mg Oral Daily   feeding supplement  237 mL Oral TID BM   feeding supplement (PROSource TF20)  60 mL Per Tube QID   free water  30 mL Per Tube  QID   gabapentin  600 mg Oral BID   insulin aspart  0-20 Units Subcutaneous TID AC   insulin detemir  22 Units Subcutaneous BID   ipratropium-albuterol  3 mL Nebulization Q6H   lidocaine  1 patch Transdermal Q24H   multivitamin with minerals  1 tablet Oral Daily   mupirocin ointment   Nasal BID   nutrition supplement (JUVEN)  1 packet Per Tube BID BM   pantoprazole  40 mg Oral Daily  polyethylene glycol  17 g Oral Daily   Continuous Infusions:  sodium chloride 10 mL/hr at 03/14/22 1036     LOS: 43 days    Time spent: 30 mins     Wyvonnia Dusky, MD [facility] Pager [phone]  If 7PM-7AM, please contact night-coverage [email or web address] 03/26/2022, 9:01 AM

## 2022-03-26 NOTE — Telephone Encounter (Signed)
Pharmacy Patient Advocate Encounter  Insurance verification completed.    The patient is insured through [facility] [place]   The patient is currently admitted and ran test claims for the following: Farxiga, Jardiance.  Copays and coinsurance results were relayed to Inpatient clinical team.

## 2022-03-26 NOTE — Progress Notes (Signed)
Nutrition Follow Up Note   DOCUMENTATION CODES:   Morbid obesity  INTERVENTION:   Ensure Enlive po TID, each supplement provides 350 kcal and 20 grams of protein.  Free water flushes 5ml BID via tube   Juven Fruit Punch BID, each serving provides 95kcal and 2.5g of protein (amino acids glutamine and arginine)  Double protein with meal trays   NUTRITION DIAGNOSIS:   Inadequate oral intake related to inability to eat (pt sedated and ventilated) as evidenced by NPO status.  GOAL:   Patient will meet greater than or equal to 90% of their needs -progressing   MONITOR:   PO intake, Supplement acceptance, Labs, Weight trends, Skin, I & O's  ASSESSMENT:   46 y/o male with h/o COPD, CHF, HTN, DM and OSA who is admitted with CHF exacerbation, CAP and severe ARDS.  -Pt s/p tracheostomy and NGT placement 8/8 (now removed) -Pt s/p new HD 8/12 (now discontinued)  -Pt s/p surgically placed 16 French gastric tube 8/25  48 hour calorie completed. Pt meeting ~40-70% of his estimated needs via oral intake. Pt also eating fried chicken and food brought from outside of the hospital. Calorie Count results discussed with patient. Educated patient that although he is doing well, he is not quite eating enough to meet his needs. Pt does not want supplements put down his tube; he prefers to drink them. RD gave patient the choice of supplement; pt would like to continue the Ensure supplements and discontinue the ProSource. RD discussed with pt the importance of taking the Juven supplements to support wound healing. Pt agrees to drink the Juven; pt made aware that if he does not drink it, it will be given via his tube. Pt also aware that tube needs to be flushed a couple of times a day to maintain patency. Tube cannot be removed for 2-3 weeks per surgery. Pt is asking to get up and work with therapy today. RD encouraged patient to get up and move around as much as he can to help rebuild his muscle. Pt has  not had BM since 8/30; RN and MD aware. Per chart, pt remains up ~40lbs from his UBW.   RD provided patient with low sodium diet education today. Discussed restaurant foods and recommended limiting eating out to maybe just a couple of meals a week. Pt's daughter also present for eduction. RD provided "Low Sodium Nutrition Therapy" handout from the Academy of [employer] Dietetics for patient and daughter.   Medications reviewed and include: colace, insulin, MVI, juven, protonix, miralax  Labs reviewed: K 4.8 wnl- 9/5 Mg 1.6(L)- 9/4 Hgb 8.6(L), Hct 30.6(L) Cbgs- 137, 141, 123 x 24 hrs  Diet Order:   Diet Order             Diet regular Room service appropriate? Yes with Assist; Fluid consistency: Thin  Diet effective now                  EDUCATION NEEDS:   Education needs have been addressed  Skin:    Stage III: buttocks Incisions: neck s/p trach  Last BM:  03/19/22  Height:   Ht Readings from Last 1 Encounters:  02/28/22 5\' 9"  (1.753 m)    Weight:   Wt Readings from Last 1 Encounters:  03/24/22 (!) 191 kg    Ideal Body Weight:  72.7 kg  BMI:  Body mass index is 62.17 kg/m.  Estimated Nutritional Needs:   Kcal:  2700-3000kcal/day  Protein:  >150g/day  Fluid:  2.0L/day  Koleen Distance MS, RD, LDN Please refer to [facility] for RD and/or RD on-call/weekend/after hours pager

## 2022-03-26 NOTE — TOC Benefit Eligibility Note (Signed)
Patient Teacher, English as a foreign language completed.    The patient is currently admitted and upon discharge could be taking Farxiga 10 mg.  Requires Prior Authorization  The patient is currently admitted and upon discharge could be taking Jardiance 10 mg.  Requires Prior Authorization  The patient is insured through [place], [place] Patient Advocate Specialist [place] Patient Advocate Team Direct Number: [phone]  Fax: [phone]

## 2022-03-26 NOTE — Progress Notes (Signed)
Calorie Count Day 2  Estimated Nutritional Needs:    Kcal:  2700-3000kcal/day Protein:  >150g/day Fluid:  2.0L/day   Dinner 9/5: 20% spaghetti, 80% fruit cup, 50% corn, 100% mango ice, 182ml unsweet tea  Total- 335kcal and 5g protein   Breakfast 9/6: 100% [place] toast, scrambled eggs, sausage milk   Total- 615kcal and 33g protein   Lunch 9/6: 100% grilled chicken sandwich, fruit cup and mango ice  Total- 632kcal and 27g protein   Total Intake:  1582kcal (59% estimated needs) and  65g protein (43% estimated needs)   Koleen Distance MS, RD, LDN Please refer to [facility] for RD and/or RD on-call/weekend/after hours pager

## 2022-03-26 NOTE — TOC Progression Note (Signed)
Transition of Care [facility]) - Progression Note    Patient Details  Name: Samuel Chavez MRN: [id number] Date of Birth: 1976-04-17  Transition of Care [facility]) CM/SW [place], [place] Phone Number: 03/26/2022, 9:33 AM  Clinical Narrative:     CSW spoke with Delsa Sale with [employer] who reports updated clinicals have been sent to [facility] in [facility] for review, pending their response at this time. She reports she will update CSW today with any news.   CSW notes patient unable to go to [facility] as they do not accept Medicaid patients.   Expected Discharge Plan:  (TBD) Barriers to Discharge: Continued Medical Work up  Expected Discharge Plan and Services Expected Discharge Plan:  (TBD)   Discharge Planning Services: CM Consult   Living arrangements for the past 2 months: Single Family Home                                       Social Determinants of Health (SDOH) Interventions    Readmission Risk Interventions     No data to display

## 2022-03-27 DIAGNOSIS — J9602 Acute respiratory failure with hypercapnia: Secondary | ICD-10-CM | POA: Diagnosis not present

## 2022-03-27 DIAGNOSIS — E1169 Type 2 diabetes mellitus with other specified complication: Secondary | ICD-10-CM | POA: Diagnosis not present

## 2022-03-27 DIAGNOSIS — J9601 Acute respiratory failure with hypoxia: Secondary | ICD-10-CM | POA: Diagnosis not present

## 2022-03-27 LAB — GLUCOSE, CAPILLARY
Glucose-Capillary: 121 mg/dL — ABNORMAL HIGH (ref 70–99)
Glucose-Capillary: 154 mg/dL — ABNORMAL HIGH (ref 70–99)
Glucose-Capillary: 143 mg/dL — ABNORMAL HIGH (ref 70–99)
Glucose-Capillary: 129 mg/dL — ABNORMAL HIGH (ref 70–99)

## 2022-03-27 LAB — BASIC METABOLIC PANEL
Anion gap: 7 (ref 5–15)
BUN: 13 mg/dL (ref 6–20)
CO2: 32 mmol/L (ref 22–32)
Calcium: 8.3 mg/dL — ABNORMAL LOW (ref 8.9–10.3)
Chloride: 98 mmol/L (ref 98–111)
Creatinine, Ser: 0.63 mg/dL (ref 0.61–1.24)
GFR, Estimated: >60 mL/min (ref >60)
Glucose, Bld: 154 mg/dL — ABNORMAL HIGH (ref 70–99)
Potassium: 4.4 mmol/L (ref 3.5–5.1)
Sodium: 137 mmol/L (ref 135–145)

## 2022-03-27 LAB — CBC
HCT: 28.9 % — ABNORMAL LOW (ref 39.0–52.0)
Hemoglobin: 8.3 g/dL — ABNORMAL LOW (ref 13.0–17.0)
MCH: 27.2 pg (ref 26.0–34.0)
MCHC: 28.7 g/dL — ABNORMAL LOW (ref 30.0–36.0)
MCV: 94.8 fL (ref 80.0–100.0)
Platelets: 269 10*3/uL (ref 150–400)
RBC: 3.05 MIL/uL — ABNORMAL LOW (ref 4.22–5.81)
RDW: 16.2 % — ABNORMAL HIGH (ref 11.5–15.5)
WBC: 8.1 10*3/uL (ref 4.0–10.5)
nRBC: 0 % (ref 0.0–0.2)

## 2022-03-27 LAB — MAGNESIUM: Magnesium: 1.3 mg/dL — ABNORMAL LOW (ref 1.7–2.4)

## 2022-03-27 MED ORDER — MAGNESIUM SULFATE 4 GM/100ML IV SOLN
4.0000 g | Freq: Once | INTRAVENOUS | Status: AC
  Administered 2022-03-27: 4 g via INTRAVENOUS
  Filled 2022-03-27: qty 100

## 2022-03-27 NOTE — Progress Notes (Signed)
Physical Therapy Treatment Patient Details Name: Samuel Chavez MRN: [id number] DOB: 1975/07/25 Today's Date: 03/27/2022   History of Present Illness Pt is 69 YOM admitted for PNA, renail failure, AKI, hyperkalemia, CHF, Resp failure, sepsis, and is s/p prolonged ventilation, trach placement. PMH includes: DM, HTN, sCHF, OSA, obesity, ARDS Resp failure, sepsis.    PT Comments    Co-tx with OT 1 unit billed each.  Initial plan to get pt EOB but pt was found to be inc large amount of urine with external cath pealing away not managing urine.  Session spent rolling left/right and care given with new linens.  Pt fatigued with activity.  RN in to assist.  Pt did do BUE AAROM x 10 with assist on L and R.      Recommendations for follow up therapy are one component of a multi-disciplinary discharge planning process, led by the attending physician.  Recommendations may be updated based on patient status, additional functional criteria and insurance authorization.  Follow Up Recommendations  PT at Long-term acute care hospital     Assistance Recommended at Discharge Frequent or constant Supervision/Assistance  Patient can return home with the following Two people to help with walking and/or transfers;Two people to help with bathing/dressing/bathroom;Direct supervision/assist for medications management;Help with stairs or ramp for entrance;Assist for transportation;Assistance with feeding;Assistance with cooking/housework;Direct supervision/assist for financial management   Equipment Recommendations       Recommendations for Other Services       Precautions / Restrictions Precautions Precautions: Fall Restrictions Weight Bearing Restrictions: No Other Position/Activity Restrictions: HOB 30 deg     Mobility  Bed Mobility Overal bed mobility: Needs Assistance Bed Mobility: Rolling Rolling: Max assist, +2 for physical assistance         General bed mobility comments: rolling  left/rigth with max a x 2 and +3 for changing linens and care    Transfers                        Ambulation/Gait                   Stairs             Wheelchair Mobility    Modified Rankin (Stroke Patients Only)       Balance                                            Cognition Arousal/Alertness: Awake/alert Behavior During Therapy: WFL for tasks assessed/performed Overall Cognitive Status: Within Functional Limits for tasks assessed                                          Exercises Other Exercises Other Exercises: BUE AAROM with OT on R, writer on L to assist    General Comments        Pertinent Vitals/Pain Pain Assessment Pain Assessment: Faces Faces Pain Scale: Hurts even more Pain Location: back Pain Descriptors / Indicators: Discomfort Pain Intervention(s): Limited activity within patient's tolerance, Monitored during session, Repositioned    Home Living                          Prior Function  PT Goals (current goals can now be found in the care plan section) Progress towards PT goals: Progressing toward goals    Frequency    Min 2X/week      PT Plan Current plan remains appropriate    Co-evaluation              AM-PAC PT "6 Clicks" Mobility   Outcome Measure  Help needed turning from your back to your side while in a flat bed without using bedrails?: Total Help needed moving from lying on your back to sitting on the side of a flat bed without using bedrails?: Total Help needed moving to and from a bed to a chair (including a wheelchair)?: Total Help needed standing up from a chair using your arms (e.g., wheelchair or bedside chair)?: Total Help needed to walk in hospital room?: Total Help needed climbing 3-5 steps with a railing? : Total 6 Click Score: 6    End of Session   Activity Tolerance: Patient tolerated treatment well;Patient limited by  fatigue Patient left: in bed;with call bell/phone within reach;with family/visitor present Nurse Communication: Mobility status PT Visit Diagnosis: Muscle weakness (generalized) (M62.81);Other abnormalities of gait and mobility (R26.89)     Time: 1352-1416 PT Time Calculation (min) (ACUTE ONLY): 24 min  Charges:  $Therapeutic Activity: 8-22 mins                   Chesley Noon, PTA 03/27/22, 2:23 PM

## 2022-03-27 NOTE — Progress Notes (Signed)
Occupational Therapy Re-Evaluation Patient Details Name: Samuel Chavez MRN: [id number] DOB: [birth year] Today's Date: 03/27/2022   History of present illness Pt is 15 YOM admitted for PNA, renail failure, AKI, hyperkalemia, CHF, Resp failure, sepsis, and is s/p prolonged ventilation, trach placement. PMH includes: DM, HTN, sCHF, OSA, obesity, ARDS Resp failure, sepsis.   OT comments  Mr Dicenzo was seen for OT re-evaluation and PT co-treatment on this date. Upon arrival to room pt reclined in bed, RN at bed side. Pt noted to be incontinent of urine 2/2 purewick malfunction, pt reports he was unable to tell he was wet. Pt requires MOD A x2 R rolling, MAX A x2 pericare at bed level. From R sidelying pt requires MAX A x2 L rolling. MIN A don/doff gown at bed level. Performed BUE AAROM at bed level. Pt making good progress toward goals, will continue to follow POC. Discharge recommendation remains appropriate.     Recommendations for follow up therapy are one component of a multi-disciplinary discharge planning process, led by the attending physician.  Recommendations may be updated based on patient status, additional functional criteria and insurance authorization.    Follow Up Recommendations  OT at Long-term acute care hospital    Assistance Recommended at Discharge Frequent or constant Supervision/Assistance  Patient can return home with the following  Two people to help with walking and/or transfers;Two people to help with bathing/dressing/bathroom   Equipment Recommendations  Other (comment) (defer)    Recommendations for Other Services      Precautions / Restrictions Precautions Precautions: Fall Restrictions Weight Bearing Restrictions: No Other Position/Activity Restrictions: HOB 30 deg       Mobility Bed Mobility Overal bed mobility: Needs Assistance Bed Mobility: Rolling Rolling: Mod assist, Max assist, +2 for physical assistance         General bed mobility  comments: MOD A rolling R side, MAX A x2 rolling L side    Transfers                   General transfer comment: defered citing fatigue         ADL either performed or assessed with clinical judgement   ADL Overall ADL's : Needs assistance/impaired                                       General ADL Comments: MAX A x2 pericare at bed level. MIN A don/doff gown at bed level.      Cognition Arousal/Alertness: Awake/alert Behavior During Therapy: WFL for tasks assessed/performed Overall Cognitive Status: Within Functional Limits for tasks assessed                                          Exercises Other Exercises Other Exercises: BUE AAROM with good effort and technique     Pertinent Vitals/ Pain       Pain Assessment Pain Assessment: Faces Faces Pain Scale: Hurts even more Pain Location: back Pain Descriptors / Indicators: Discomfort Pain Intervention(s): Limited activity within patient's tolerance, Repositioned   Frequency  Min 2X/week        Progress Toward Goals  OT Goals(current goals can now be found in the care plan section)  Progress towards OT goals: Progressing toward goals  Acute Rehab OT Goals Patient Stated Goal: to improve pain  OT Goal Formulation: With patient/family Time For Goal Achievement: 04/10/22 Potential to Achieve Goals: Fair ADL Goals Pt Will Perform Grooming: with min assist;bed level Pt Will Transfer to Toilet: with max assist;with +2 assist Pt/caregiver will Perform Home Exercise Program: Increased ROM;Increased strength;Right Upper extremity;Left upper extremity;With minimal assist  Plan Discharge plan remains appropriate;Frequency remains appropriate    Co-evaluation    PT/OT/SLP Co-Evaluation/Treatment: Yes Reason for Co-Treatment: For patient/therapist safety;To address functional/ADL transfers PT goals addressed during session: Mobility/safety with mobility OT goals addressed  during session: ADL's and self-care      AM-PAC OT "6 Clicks" Daily Activity     Outcome Measure   Help from another person eating meals?: A Little Help from another person taking care of personal grooming?: A Little Help from another person toileting, which includes using toliet, bedpan, or urinal?: A Lot Help from another person bathing (including washing, rinsing, drying)?: A Lot Help from another person to put on and taking off regular upper body clothing?: A Lot Help from another person to put on and taking off regular lower body clothing?: A Lot 6 Click Score: 14    End of Session    OT Visit Diagnosis: Muscle weakness (generalized) (M62.81);Other abnormalities of gait and mobility (R26.89)   Activity Tolerance Patient tolerated treatment well   Patient Left in bed;with call bell/phone within reach;with nursing/sitter in room   Nurse Communication Mobility status        Time: 1352-1416 OT Time Calculation (min): 24 min  Charges: OT General Charges $OT Visit: 1 Visit OT Evaluation $OT Re-eval: 1 Re-eval OT Treatments $Self Care/Home Management : 8-22 mins  Dessie Coma, M.S. OTR/L  03/27/22, 5:26 PM  ascom [phone]

## 2022-03-27 NOTE — Progress Notes (Signed)
PROGRESS NOTE    Samuel Chavez  YJE:[id number] DOB: February 10, 1976 DOA: 02/11/2022 PCP: [facility], [facility]  Assessment & Plan:   Principal Problem:   Acute respiratory failure with hypoxia and hypercarbia ([place]) Active Problems:   Acute on chronic systolic CHF (congestive heart failure) (HCC)   Hypertensive urgency   Type 2 diabetes mellitus with peripheral neuropathy (HCC)   Acute heart failure with preserved ejection fraction (HCC)   Renal failure (ARF), acute on chronic (HCC)   Hospital-acquired pneumonia   ARDS (adult respiratory distress syndrome) (HCC)   Pressure injury of skin   Constipation  Assessment and Plan: Acute on chronic hypoxemic and hypercapneic respiratory failure: s/p tracheostomy. Hx of OSA/OHS. Continue w/ trach collar & supplemental oxygen.   MRSA HCAP: completed linezolid course.  Provetella sinusitis & has completed unasyn/augmentin. ID signed off   AKI: resolved   Hypernatremia: WNL today. Continue w/ free water flushes   Constipation: continue on miralax   Hyperkalemia: WNL today    Hypomagnesemia: mag sulfate ordered     DM2: likely poorly controlled. Continue on levemir, SSI w/ accuchecks. Tube feeds have been d/c. Per surgery, gastric tube sutures taken off & do NOT recommend removing the tube yet since the track hasnt matured yet.  Give it another 2-3 weeks before considering removal as per gen surg    Normocytic anemia: H&H are labile. 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 EEG 8/16 neg. PT recs LTAC and still waiting on placement    PICC associated DVT: PICC removed. Continue on eliquis   Depression: severity unknown. Continue on lexapro    Left leg pain: w/ hx of sciatica as per pt. Continue on flexeril, gabapentin. LLE venous US was neg    Morbidly obese: BMI 62.1. Complicates overall care & prognosis      DVT prophylaxis:  eliquis  Code Status: full  Family Communication: discussed pt's care w/ pt's family at bedside and answered their questions  Disposition Plan: possibly d/c to LTAC but still waiting on placement  Level of care: Progressive  Status is: Inpatient Remains inpatient appropriate because: severity of illness   Consultants:  ICU Gen surg  Nephro  ID  Cardio   Procedures:   Antimicrobials:    Subjective: Pt c/o malaise   Objective: Vitals:   03/27/22 0006 03/27/22 0235 03/27/22 0236 03/27/22 0334  BP: (!) 143/84   134/69  Pulse: 95   92  Resp: 18   16  Temp: 99.3 F (37.4 C) 98.3 F (36.8 C)  98.6 F (37 C)  TempSrc: Oral Oral  Oral  SpO2: 93%  95% 100%  Weight:      Height:        Intake/Output Summary (Last 24 hours) at 03/27/2022 0750 Last data filed at 03/27/2022 0237 Gross per 24 hour  Intake 480 ml  Output 1800 ml  Net -1320 ml   Filed Weights   03/24/22 1508 03/24/22 1714  Weight: (!) 182.8 kg (!) 191 kg    Examination:  General exam: Appears comfortable. Morbidly obese Respiratory system: decreased breath sounds b/l  Cardiovascular system: S1/S2+. No rubs or gallops  Gastrointestinal system: Abd is soft, NT, obese & hypoactive bowel sounds  Central nervous system: Alert and oriented. Moves all extremities  Psychiatry: Judgement and insight appears at baseline. Flat mood and affect    Data Reviewed: I have personally reviewed following labs and imaging studies  CBC:  Recent Labs  Lab 03/21/22 0530 03/25/22 1011 03/27/22 0346  WBC 6.4 7.1 8.1  NEUTROABS 3.9  --   --   HGB 8.4* 8.6* 8.3*  HCT 30.2* 30.6* 28.9*  MCV 95.9 94.7 94.8  PLT 308 286 606   Basic Metabolic Panel: Recent Labs  Lab 03/21/22 0530 03/22/22 0915 03/23/22 0916 03/23/22 1803 03/24/22 0706 03/25/22 1011 03/27/22 0346  NA 140  --  138  --   --   --  137  K 4.9   < > 5.3* 4.8 5.1 4.8 4.4  CL 104  --  103  --   --   --  98  CO2 32  --  31  --   --   --  32  GLUCOSE  102*  --  128*  --   --   --  154*  BUN 30*  --  30*  --   --   --  13  CREATININE 0.72  --  0.68  --   --   --  0.63  CALCIUM 8.2*  --  8.3*  --   --   --  8.3*  MG 1.7  --  1.3*  --  1.6*  --  1.3*  PHOS 3.7  --  2.5  --   --   --   --    < > = values in this interval not displayed.   GFR: Estimated Creatinine Clearance: 198 mL/min (by C-G formula based on SCr of 0.63 mg/dL). Liver Function Tests: Recent Labs  Lab 03/21/22 0530  ALBUMIN 2.7*   No results for input(s): "LIPASE", "AMYLASE" in the last 168 hours. No results for input(s): "AMMONIA" in the last 168 hours. Coagulation Profile: No results for input(s): "INR", "PROTIME" in the last 168 hours. Cardiac Enzymes: No results for input(s): "CKTOTAL", "CKMB", "CKMBINDEX", "TROPONINI" in the last 168 hours. BNP (last 3 results) No results for input(s): "PROBNP" in the last 8760 hours. HbA1C: No results for input(s): "HGBA1C" in the last 72 hours. CBG: Recent Labs  Lab 03/25/22 2218 03/26/22 0832 03/26/22 1143 03/26/22 1611 03/26/22 2037  GLUCAP 122* 123* 141* 137* 135*   Lipid Profile: No results for input(s): "CHOL", "HDL", "LDLCALC", "TRIG", "CHOLHDL", "LDLDIRECT" in the last 72 hours. Thyroid Function Tests: No results for input(s): "TSH", "T4TOTAL", "FREET4", "T3FREE", "THYROIDAB" in the last 72 hours. Anemia Panel: No results for input(s): "VITAMINB12", "FOLATE", "FERRITIN", "TIBC", "IRON", "RETICCTPCT" in the last 72 hours. Sepsis Labs: No results for input(s): "PROCALCITON", "LATICACIDVEN" in the last 168 hours.  No results found for this or any previous visit (from the past 240 hour(s)).       Radiology Studies: No results found.      Scheduled Meds:  apixaban  5 mg Oral BID   carvedilol  6.25 mg Oral BID WC   Chlorhexidine Gluconate Cloth  6 each Topical Q0600   docusate sodium  100 mg Oral BID   escitalopram  10 mg Oral Daily   feeding supplement  237 mL Oral TID BM   free water  30 mL Per  Tube BID   gabapentin  600 mg Oral BID   insulin aspart  0-20 Units Subcutaneous TID AC   insulin detemir  22 Units Subcutaneous BID   ipratropium-albuterol  3 mL Nebulization Q6H   lidocaine  1 patch Transdermal Q24H   multivitamin with minerals  1 tablet Oral Daily   mupirocin ointment   Nasal BID   nutrition supplement (JUVEN)  1 packet Oral BID BM   pantoprazole  40 mg Oral Daily   polyethylene glycol  17 g Oral Daily   Continuous Infusions:  sodium chloride 10 mL/hr at 03/14/22 1036     LOS: 44 days    Time spent: 25 mins     Wyvonnia Dusky, MD [facility] Pager [phone]  If 7PM-7AM, please contact night-coverage [email or web address] 03/27/2022, 7:50 AM

## 2022-03-27 NOTE — Plan of Care (Signed)
Patient remained free of falls this shift. PRN pain meds given through out shift. External catheter changed this shift. Patient refused mobility with RN but was amiable for movement with PT. PIV dressings changed this shift. Split gauze to gtube changed. Mepilex dressing to buttock changed. Sacral wound cleaned to left buttock, wound noted to right buttock, cleaned and mepilex placed. Patient had complaints of muffled hearing to ear, MD notified. VSS. Will continue to monitor

## 2022-03-28 DIAGNOSIS — J9601 Acute respiratory failure with hypoxia: Secondary | ICD-10-CM | POA: Diagnosis not present

## 2022-03-28 DIAGNOSIS — J9602 Acute respiratory failure with hypercapnia: Secondary | ICD-10-CM | POA: Diagnosis not present

## 2022-03-28 DIAGNOSIS — E1169 Type 2 diabetes mellitus with other specified complication: Secondary | ICD-10-CM | POA: Diagnosis not present

## 2022-03-28 LAB — CBC
HCT: 32.1 % — ABNORMAL LOW (ref 39.0–52.0)
Hemoglobin: 9.1 g/dL — ABNORMAL LOW (ref 13.0–17.0)
MCH: 26.6 pg (ref 26.0–34.0)
MCHC: 28.3 g/dL — ABNORMAL LOW (ref 30.0–36.0)
MCV: 93.9 fL (ref 80.0–100.0)
Platelets: 216 10*3/uL (ref 150–400)
RBC: 3.42 MIL/uL — ABNORMAL LOW (ref 4.22–5.81)
RDW: 16.6 % — ABNORMAL HIGH (ref 11.5–15.5)
WBC: 7.1 10*3/uL (ref 4.0–10.5)
nRBC: 0 % (ref 0.0–0.2)

## 2022-03-28 LAB — MAGNESIUM: Magnesium: 1.5 mg/dL — ABNORMAL LOW (ref 1.7–2.4)

## 2022-03-28 LAB — BASIC METABOLIC PANEL
Anion gap: 8 (ref 5–15)
BUN: 13 mg/dL (ref 6–20)
CO2: 32 mmol/L (ref 22–32)
Calcium: 8.6 mg/dL — ABNORMAL LOW (ref 8.9–10.3)
Chloride: 98 mmol/L (ref 98–111)
Creatinine, Ser: 0.6 mg/dL — ABNORMAL LOW (ref 0.61–1.24)
GFR, Estimated: >60 mL/min (ref >60)
Glucose, Bld: 121 mg/dL — ABNORMAL HIGH (ref 70–99)
Potassium: 4.3 mmol/L (ref 3.5–5.1)
Sodium: 138 mmol/L (ref 135–145)

## 2022-03-28 LAB — GLUCOSE, CAPILLARY
Glucose-Capillary: 112 mg/dL — ABNORMAL HIGH (ref 70–99)
Glucose-Capillary: 137 mg/dL — ABNORMAL HIGH (ref 70–99)
Glucose-Capillary: 103 mg/dL — ABNORMAL HIGH (ref 70–99)
Glucose-Capillary: 161 mg/dL — ABNORMAL HIGH (ref 70–99)

## 2022-03-28 MED ORDER — CHLORHEXIDINE GLUCONATE CLOTH 2 % EX PADS
6.0000 | MEDICATED_PAD | Freq: Every day | CUTANEOUS | Status: DC
  Administered 2022-03-28 – 2022-04-06 (×9): 6 via TOPICAL

## 2022-03-28 MED ORDER — MAGNESIUM SULFATE 2 GM/50ML IV SOLN
2.0000 g | Freq: Once | INTRAVENOUS | Status: AC
Start: 2022-03-28 — End: 2022-03-28
  Administered 2022-03-28: 2 g via INTRAVENOUS
  Filled 2022-03-28: qty 50

## 2022-03-28 MED ORDER — CAMPHOR-MENTHOL 0.5-0.5 % EX LOTN
TOPICAL_LOTION | CUTANEOUS | Status: DC | PRN
  Filled 2022-03-28 (×2): qty 222

## 2022-03-28 NOTE — Progress Notes (Signed)
PROGRESS NOTE    Samuel Chavez  ZOX:[id number] DOB: Aug 26, 1975 DOA: 02/11/2022 PCP: [facility]  Assessment & Plan:   Principal Problem:   Acute respiratory failure with hypoxia and hypercarbia ([place]) Active Problems:   Acute on chronic systolic CHF (congestive heart failure) (HCC)   Hypertensive urgency   Type 2 diabetes mellitus with peripheral neuropathy (HCC)   Acute heart failure with preserved ejection fraction (HCC)   Renal failure (ARF), acute on chronic (HCC)   Hospital-acquired pneumonia   ARDS (adult respiratory distress syndrome) (HCC)   Pressure injury of skin   Constipation  Assessment and Plan: Acute on chronic hypoxemic and hypercapneic respiratory failure: s/p tracheostomy. Hx of OSA/OHS. Continue w/ trach collar & supplemental oxygen.   MRSA HCAP: completed linezolid course.  Provetella sinusitis & has completed unasyn/augmentin. ID signed off  Muffled hearing: of left eye. Secondary to wax build up. Nurse will flush left ear.    AKI: resolved   Hypernatremia: WNL. Continue w/ water flushes   Constipation: continue on miralax   Hyperkalemia: WNL today   Hypomagnesemia: mg sulfate given    DM2: likely poorly controlled. Continue on levemir, SSI w/ accuchecks. Tube feeds have been d/c. Per surgery, gastric tube sutures taken off & do NOT recommend removing the tube yet since the track hasnt matured yet.  Give it another 2-3 weeks before considering removal as per gen surg    Normocytic anemia: H&H are labile. 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 EEG 8/16 neg. PT/OT recs LTAC and still waiting on placement    PICC associated DVT: PICC removed. Continue on eliquis    Depression: severity unknown. Continue on lexapro    Left leg pain: w/ hx of sciatica as per pt. Continue on gabapentin, flexeril. LLE [place] was neg for DVT   Morbidly obese:  BMI 62.1. Complicates overall care & prognosis      DVT prophylaxis: eliquis  Code Status: full  Family Communication: discussed pt's care w/ pt's family at bedside and answered their questions  Disposition Plan: possibly d/c to LTAC but still waiting on placement  Level of care: Progressive  Status is: Inpatient Remains inpatient appropriate because: severity of illness   Consultants:  ICU Gen surg  Nephro  ID  Cardio   Procedures:   Antimicrobials:    Subjective: Pt c/o muffled hearing the left ear  Objective: Vitals:   03/28/22 0258 03/28/22 0327 03/28/22 0500 03/28/22 0738  BP:  125/75  131/87  Pulse:  95  99  Resp: 12 18  20   Temp:  98 F (36.7 C)  98.2 F (36.8 C)  TempSrc:  Oral    SpO2:  91%  (!) 84%  Weight:   (!) 177.8 kg   Height:        Intake/Output Summary (Last 24 hours) at 03/28/2022 0757 Last data filed at 03/28/2022 0545 Gross per 24 hour  Intake 1440 ml  Output 4425 ml  Net -2985 ml   Filed Weights   03/24/22 1508 03/24/22 1714 03/28/22 0500  Weight: (!) 182.8 kg (!) 191 kg (!) 177.8 kg    Examination:  General exam: Appears calm & comfortable. Morbidly obese  Respiratory system: diminished breath sounds b/l   Cardiovascular system: S1/S2+. No rubs or gallops  Gastrointestinal system: Abd is soft, NT, obese & hypoactive bowel sounds  Central nervous system: Alert and oriented. Moves all extremities  Psychiatry: Judgement  and insight appears at baseline. Flat mood and affect    Data Reviewed: I have personally reviewed following labs and imaging studies  CBC: Recent Labs  Lab 03/25/22 1011 03/27/22 0346  WBC 7.1 8.1  HGB 8.6* 8.3*  HCT 30.6* 28.9*  MCV 94.7 94.8  PLT 286 702   Basic Metabolic Panel: Recent Labs  Lab 03/23/22 0916 03/23/22 1803 03/24/22 0706 03/25/22 1011 03/27/22 0346  NA 138  --   --   --  137  K 5.3* 4.8 5.1 4.8 4.4  CL 103  --   --   --  98  CO2 31  --   --   --  32  GLUCOSE 128*  --   --    --  154*  BUN 30*  --   --   --  13  CREATININE 0.68  --   --   --  0.63  CALCIUM 8.3*  --   --   --  8.3*  MG 1.3*  --  1.6*  --  1.3*  PHOS 2.5  --   --   --   --    GFR: Estimated Creatinine Clearance: 189.2 mL/min (by C-G formula based on SCr of 0.63 mg/dL). Liver Function Tests: No results for input(s): "AST", "ALT", "ALKPHOS", "BILITOT", "PROT", "ALBUMIN" in the last 168 hours.  No results for input(s): "LIPASE", "AMYLASE" in the last 168 hours. No results for input(s): "AMMONIA" in the last 168 hours. Coagulation Profile: No results for input(s): "INR", "PROTIME" in the last 168 hours. Cardiac Enzymes: No results for input(s): "CKTOTAL", "CKMB", "CKMBINDEX", "TROPONINI" in the last 168 hours. BNP (last 3 results) No results for input(s): "PROBNP" in the last 8760 hours. HbA1C: No results for input(s): "HGBA1C" in the last 72 hours. CBG: Recent Labs  Lab 03/26/22 2037 03/27/22 0844 03/27/22 1224 03/27/22 1628 03/27/22 2129  GLUCAP 135* 121* 154* 143* 129*   Lipid Profile: No results for input(s): "CHOL", "HDL", "LDLCALC", "TRIG", "CHOLHDL", "LDLDIRECT" in the last 72 hours. Thyroid Function Tests: No results for input(s): "TSH", "T4TOTAL", "FREET4", "T3FREE", "THYROIDAB" in the last 72 hours. Anemia Panel: No results for input(s): "VITAMINB12", "FOLATE", "FERRITIN", "TIBC", "IRON", "RETICCTPCT" in the last 72 hours. Sepsis Labs: No results for input(s): "PROCALCITON", "LATICACIDVEN" in the last 168 hours.  No results found for this or any previous visit (from the past 240 hour(s)).       Radiology Studies: No results found.      Scheduled Meds:  apixaban  5 mg Oral BID   carvedilol  6.25 mg Oral BID WC   Chlorhexidine Gluconate Cloth  6 each Topical Q0600   docusate sodium  100 mg Oral BID   escitalopram  10 mg Oral Daily   feeding supplement  237 mL Oral TID BM   free water  30 mL Per Tube BID   gabapentin  600 mg Oral BID   insulin aspart  0-20  Units Subcutaneous TID AC   insulin detemir  22 Units Subcutaneous BID   ipratropium-albuterol  3 mL Nebulization Q6H   lidocaine  1 patch Transdermal Q24H   multivitamin with minerals  1 tablet Oral Daily   mupirocin ointment   Nasal BID   nutrition supplement (JUVEN)  1 packet Oral BID BM   pantoprazole  40 mg Oral Daily   polyethylene glycol  17 g Oral Daily   Continuous Infusions:  sodium chloride 10 mL/hr at 03/14/22 1036     LOS: 45 days  Time spent: 25 mins     Wyvonnia Dusky, MD [facility] Pager [phone]  If 7PM-7AM, please contact night-coverage [email or web address] 03/28/2022, 7:57 AM

## 2022-03-28 NOTE — Progress Notes (Signed)
Pt. Complains of "pressure" discomfort in upper left abdomen. I have given him Colace, Miralax and now Lactulose. Advised pt. That I would give him some time for medications to be effective and if no resolution will contact MD for further interventions.

## 2022-03-28 NOTE — Progress Notes (Signed)
Encouraging pt to remove PMV during the night during hours of sleep. Educated pt on the importance of removing PMV. Removed PMV and when I returned 2 hours later patient had PMV on and asleep. Woke pt and explained to him why I was removing PMV again. Informed pt's RN [facility]) of pt non-compliance.

## 2022-03-28 NOTE — Progress Notes (Addendum)
SLP Cancellation Note  Patient Details Name: Samuel Chavez MRN: [id number] DOB: [birth year]   Cancelled treatment:       Reason Eval/Treat Not Completed: Patient declined, no reason specified (pt sleeping w/ PMV in place upon entering room; pt did not awaken to take part in therapy session. PMV removed.)  10:30am -- Noted pt sleeping w/ PMV placed upon entering room. Family member in chair sleeping also. Blinds closed.  Attempted waking pt w/ verbal/tactile stim, but pt remained sleeping post opening eyes briefly. PMV removed. Placed signage re: diet/aspiration precautions and PMV wear/use (including not when sleeping) on NSG/Team/Pt Communication Board in room vs behind head of bed in order to better view it.   Noted RT's note at 4:00AM this morning - "Educated pt on the importance of removing PMV. Removed PMV and when I returned 2 hours later patient had PMV on and asleep. Woke pt and explained to him why I was removing PMV again. Informed pt's RN [facility]) of pt non-compliance.".  Discussed the above w/ MD and asked that MD also address following PMV instructions for safe wear and breathing w/ pt if possible.  Recommend wearing PMV for verbal communication w/ others during waking hours and w/ meals following precautions. ST services will f/u next week.  OF NOTE: Pt would benefit from a Schedule for his day to include getting out of bed in the morning and remaining up sitting in a chair during the day and for meals. Will discuss w/ PT/OT next week. This type of Schedule and Routine will aid in pt's mental status and engagement in ADLs. Also asked MD about having Psychiatry f/u to address depression which has been identified. Noted severe toxic encephalopathy: resolved, per MD.        Orinda Kenner, [place], [place] Speech Language Pathologist Re[facility] Services; [place] [phone] (ascom) Bhumi Godbey 03/28/2022, 4:27 PM

## 2022-03-29 DIAGNOSIS — E1169 Type 2 diabetes mellitus with other specified complication: Secondary | ICD-10-CM | POA: Diagnosis not present

## 2022-03-29 DIAGNOSIS — J9602 Acute respiratory failure with hypercapnia: Secondary | ICD-10-CM | POA: Diagnosis not present

## 2022-03-29 DIAGNOSIS — J9601 Acute respiratory failure with hypoxia: Secondary | ICD-10-CM | POA: Diagnosis not present

## 2022-03-29 LAB — BASIC METABOLIC PANEL
Anion gap: 8 (ref 5–15)
BUN: 10 mg/dL (ref 6–20)
CO2: 33 mmol/L — ABNORMAL HIGH (ref 22–32)
Calcium: 8.5 mg/dL — ABNORMAL LOW (ref 8.9–10.3)
Chloride: 98 mmol/L (ref 98–111)
Creatinine, Ser: 0.59 mg/dL — ABNORMAL LOW (ref 0.61–1.24)
GFR, Estimated: >60 mL/min (ref >60)
Glucose, Bld: 117 mg/dL — ABNORMAL HIGH (ref 70–99)
Potassium: 4.1 mmol/L (ref 3.5–5.1)
Sodium: 139 mmol/L (ref 135–145)

## 2022-03-29 LAB — GLUCOSE, CAPILLARY
Glucose-Capillary: 107 mg/dL — ABNORMAL HIGH (ref 70–99)
Glucose-Capillary: 167 mg/dL — ABNORMAL HIGH (ref 70–99)
Glucose-Capillary: 137 mg/dL — ABNORMAL HIGH (ref 70–99)
Glucose-Capillary: 124 mg/dL — ABNORMAL HIGH (ref 70–99)

## 2022-03-29 LAB — CBC
HCT: 29.8 % — ABNORMAL LOW (ref 39.0–52.0)
Hemoglobin: 8.5 g/dL — ABNORMAL LOW (ref 13.0–17.0)
MCH: 26.8 pg (ref 26.0–34.0)
MCHC: 28.5 g/dL — ABNORMAL LOW (ref 30.0–36.0)
MCV: 94 fL (ref 80.0–100.0)
Platelets: 253 10*3/uL (ref 150–400)
RBC: 3.17 MIL/uL — ABNORMAL LOW (ref 4.22–5.81)
RDW: 16.7 % — ABNORMAL HIGH (ref 11.5–15.5)
WBC: 7 10*3/uL (ref 4.0–10.5)
nRBC: 0 % (ref 0.0–0.2)

## 2022-03-29 LAB — MAGNESIUM: Magnesium: 1.7 mg/dL (ref 1.7–2.4)

## 2022-03-29 MED ORDER — IPRATROPIUM-ALBUTEROL 0.5-2.5 (3) MG/3ML IN SOLN
3.0000 mL | Freq: Three times a day (TID) | RESPIRATORY_TRACT | Status: DC
  Administered 2022-03-30 – 2022-03-31 (×4): 3 mL via RESPIRATORY_TRACT
  Filled 2022-03-29 (×4): qty 3

## 2022-03-29 MED ORDER — CARBAMIDE PEROXIDE 6.5 % OT SOLN
5.0000 [drp] | Freq: Two times a day (BID) | OTIC | Status: DC
  Administered 2022-03-29 – 2022-04-07 (×17): 5 [drp] via OTIC
  Filled 2022-03-29: qty 15

## 2022-03-29 NOTE — Progress Notes (Signed)
PMV removed and all trach care completed at this time.

## 2022-03-29 NOTE — Progress Notes (Signed)
PROGRESS NOTE    Samuel Chavez  ZOX:[id number] DOB: 1976-04-12 DOA: 02/11/2022 PCP: [facility]  Assessment & Plan:   Principal Problem:   Acute respiratory failure with hypoxia and hypercarbia ([place]) Active Problems:   Acute on chronic systolic CHF (congestive heart failure) (HCC)   Hypertensive urgency   Type 2 diabetes mellitus with peripheral neuropathy (HCC)   Acute heart failure with preserved ejection fraction (HCC)   Renal failure (ARF), acute on chronic (HCC)   Hospital-acquired pneumonia   ARDS (adult respiratory distress syndrome) (HCC)   Pressure injury of skin   Constipation  Assessment and Plan: Acute on chronic hypoxemic and hypercapneic respiratory failure: s/p tracheostomy. Hx of OSA/OHS. Continue w/ trach collar & supplemental oxygen    MRSA HCAP: completed linezolid course.  Provetella sinusitis & has completed unasyn/augmentin. ID signed off  Muffled hearing: of left eye. Secondary to wax build up. S/p ear flushing but pt still c/o muffled hearing    AKI: resolved   Hypernatremia: WNL. Continue w/ free water flushes   Constipation: lactulose prn   Hyperkalemia: resolved  Hypomagnesemia: WNL today    DM2: likely poorly controlled. Continue on levemir, SSI w/ accuchecks. Tube feeds have been d/c. Per surgery, gastric tube sutures taken off & do NOT recommend removing the tube yet since the track hasnt matured yet.  Give it another 2-3 weeks before considering removal as per gen surg    Normocytic anemia: H&H are labile. 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 EEG 8/16 neg. PT/OT recs LTAC and still waiting on placement    PICC associated DVT: PICC removed. Continue on eliquis    Depression: severity unknown. Continue on lexapro     Left leg pain: w/ hx of sciatica as per pt. Continue on gabapentin, flexeril. LLE [place] was neg for DVT   Morbidly  obese: BMI 62.1. Complicates overall care & prognosis     DVT prophylaxis: eliquis  Code Status: full  Family Communication: discussed pt's care w/ pt's family at bedside and answered their questions. Called pt's daughter, [name], no answer Disposition Plan: possibly d/c to LTAC but still waiting on placement  Level of care: Progressive  Status is: Inpatient Remains inpatient appropriate because: severity of illness   Consultants:  ICU Gen surg  Nephro  ID  Cardio   Procedures:   Antimicrobials:    Subjective: Pt c/o malaise   Objective: Vitals:   03/28/22 2358 03/29/22 0143 03/29/22 0511 03/29/22 0726  BP: 99/66  (!) 140/88 (!) 139/94  Pulse:   86 87  Resp:   17 18  Temp:    97.9 F (36.6 C)  TempSrc:      SpO2:  95% 100% 100%  Weight:      Height:        Intake/Output Summary (Last 24 hours) at 03/29/2022 0813 Last data filed at 03/29/2022 0514 Gross per 24 hour  Intake 1200 ml  Output 2600 ml  Net -1400 ml   Filed Weights   03/24/22 1508 03/24/22 1714 03/28/22 0500  Weight: (!) 182.8 kg (!) 191 kg (!) 177.8 kg    Examination:  General exam: Appears agitated & frustrated. Morbidly obese Respiratory system: decreased breath sounds b/l  Cardiovascular system: S1 & S2+. No rubs or clicks   Gastrointestinal system: Abd is soft, NT, obese & hypoactive bowel sounds  Central nervous system: Alert and oriented. Moves all extremities  Psychiatry:  Judgement and insight appears at baseline. Agitated & frustrated mood    Data Reviewed: I have personally reviewed following labs and imaging studies  CBC: Recent Labs  Lab 03/25/22 1011 03/27/22 0346 03/28/22 0715 03/29/22 0441  WBC 7.1 8.1 7.1 7.0  HGB 8.6* 8.3* 9.1* 8.5*  HCT 30.6* 28.9* 32.1* 29.8*  MCV 94.7 94.8 93.9 94.0  PLT 286 269 216 782   Basic Metabolic Panel: Recent Labs  Lab 03/23/22 0916 03/23/22 1803 03/24/22 0706 03/25/22 1011 03/27/22 0346 03/28/22 0715 03/29/22 0441  NA  138  --   --   --  137 138 139  K 5.3*   < > 5.1 4.8 4.4 4.3 4.1  CL 103  --   --   --  98 98 98  CO2 31  --   --   --  32 32 33*  GLUCOSE 128*  --   --   --  154* 121* 117*  BUN 30*  --   --   --  13 13 10   CREATININE 0.68  --   --   --  0.63 0.60* 0.59*  CALCIUM 8.3*  --   --   --  8.3* 8.6* 8.5*  MG 1.3*  --  1.6*  --  1.3* 1.5* 1.7  PHOS 2.5  --   --   --   --   --   --    < > = values in this interval not displayed.   GFR: Estimated Creatinine Clearance: 189.2 mL/min (A) (by C-G formula based on SCr of 0.59 mg/dL (L)). Liver Function Tests: No results for input(s): "AST", "ALT", "ALKPHOS", "BILITOT", "PROT", "ALBUMIN" in the last 168 hours.  No results for input(s): "LIPASE", "AMYLASE" in the last 168 hours. No results for input(s): "AMMONIA" in the last 168 hours. Coagulation Profile: No results for input(s): "INR", "PROTIME" in the last 168 hours. Cardiac Enzymes: No results for input(s): "CKTOTAL", "CKMB", "CKMBINDEX", "TROPONINI" in the last 168 hours. BNP (last 3 results) No results for input(s): "PROBNP" in the last 8760 hours. HbA1C: No results for input(s): "HGBA1C" in the last 72 hours. CBG: Recent Labs  Lab 03/28/22 0813 03/28/22 1138 03/28/22 1619 03/28/22 2121 03/29/22 0731  GLUCAP 112* 137* 103* 161* 107*   Lipid Profile: No results for input(s): "CHOL", "HDL", "LDLCALC", "TRIG", "CHOLHDL", "LDLDIRECT" in the last 72 hours. Thyroid Function Tests: No results for input(s): "TSH", "T4TOTAL", "FREET4", "T3FREE", "THYROIDAB" in the last 72 hours. Anemia Panel: No results for input(s): "VITAMINB12", "FOLATE", "FERRITIN", "TIBC", "IRON", "RETICCTPCT" in the last 72 hours. Sepsis Labs: No results for input(s): "PROCALCITON", "LATICACIDVEN" in the last 168 hours.  No results found for this or any previous visit (from the past 240 hour(s)).       Radiology Studies: No results found.      Scheduled Meds:  apixaban  5 mg Oral BID   carvedilol  6.25  mg Oral BID WC   Chlorhexidine Gluconate Cloth  6 each Topical Q0600   docusate sodium  100 mg Oral BID   escitalopram  10 mg Oral Daily   feeding supplement  237 mL Oral TID BM   free water  30 mL Per Tube BID   gabapentin  600 mg Oral BID   insulin aspart  0-20 Units Subcutaneous TID AC   insulin detemir  22 Units Subcutaneous BID   ipratropium-albuterol  3 mL Nebulization Q6H   lidocaine  1 patch Transdermal Q24H   multivitamin with minerals  1 tablet Oral Daily   mupirocin ointment   Nasal BID   nutrition supplement (JUVEN)  1 packet Oral BID BM   pantoprazole  40 mg Oral Daily   polyethylene glycol  17 g Oral Daily   Continuous Infusions:  sodium chloride 10 mL/hr at 03/14/22 1036     LOS: 46 days    Time spent: 25 mins     Wyvonnia Dusky, MD [facility] Pager [phone]  If 7PM-7AM, please contact night-coverage [email or web address] 03/29/2022, 8:13 AM

## 2022-03-29 NOTE — Progress Notes (Signed)
Physical Therapy Treatment Patient Details Name: Samuel Chavez MRN: [id number] DOB: [birth year] Today's Date: 03/29/2022   History of Present Illness Pt is 53 YOM admitted for PNA, renail failure, AKI, hyperkalemia, CHF, Resp failure, sepsis, and is s/p prolonged ventilation, trach placement. PMH includes: DM, HTN, sCHF, OSA, obesity, ARDS Resp failure, sepsis.    PT Comments    Pt was supine in bed with HOB elevated 30 degrees. RN in room giving medicine prior to pt getting OOB. Pt puts forth great effort throughout session. Does get very emotional about lack of  independence. " I'm just really embarrassed." Author had lengthy discussion about needing to increase daily activity and for pt to start getting OOB even if its with mechanical lift. He states understanding. Pt was able to exit L side of bed with max assist of one. Sat EOB > 15 minutes throughout session with bed deflated. Tolerated EOB exercises prior to transfer training. Pt's son was present throughout and use as +2 assistance however only SBA from son required.Attempted standing two x EOB. First attempt from elevated bed height, pt was able to achieve full upright standing for ~ 2-3 sec. On the second attempt, pt unable to stand fully upright. Pt is severely deconditioned and weak. He will greatly benefit from continued skilled PT to address deficits while maximizing independence with ADLs. Highly recommend use of mechanical lift to have pt OOB more often. Pt states willingness to sit in recliner. Acute PT will continue current POC progressing pt as able per tolerance.     Recommendations for follow up therapy are one component of a multi-disciplinary discharge planning process, led by the attending physician.  Recommendations may be updated based on patient status, additional functional criteria and insurance authorization.  Follow Up Recommendations  PT at [facility]-term acute care hospital     Assistance Recommended at Discharge  Frequent or constant Supervision/Assistance  Patient can return home with the following Two people to help with walking and/or transfers;Two people to help with bathing/dressing/bathroom;Direct supervision/assist for medications management;Help with stairs or ramp for entrance;Assist for transportation;Assistance with feeding;Assistance with cooking/housework;Direct supervision/assist for financial management   Equipment Recommendations  Other (comment) (defer to next level of care)       Precautions / Restrictions Precautions Precautions: Fall Precaution Comments: contact precautions Restrictions Weight Bearing Restrictions: No Other Position/Activity Restrictions: HOB 30 deg- trach     Mobility  Bed Mobility Overal bed mobility: Needs Assistance Bed Mobility: Supine to Sit Rolling: Mod assist Sidelying to sit: Max assist Supine to sit: Max assist     General bed mobility comments: Pt was able to roll L/R throughout session with mod assist of one only. Needs assistance to reach bed rail but one UE support on bedrail, was able to roll for linen changes/pad replacement    Transfers Overall transfer level: Needs assistance Equipment used: Rolling walker (2 wheels) (Bariatric) Transfers: Sit to/from Stand Sit to Stand: Max assist, From elevated surface, +2 safety/equipment      General transfer comment: Pt son was close SBA during standing trials. Pt stood 1 x fully upright for ~ 2-3 secounds. Bed height elevated with bed max deflated. Secound attempt to stand, pt unable to achieve full upright. He does tolerate EOB ther ex and strengthening exercise while in supine. Author encouraged increased activity throughout the day. Pt states understanding. Overall pt puts forth good effort but gets emotional about lack of independence.    Ambulation/Gait    General Gait Details: currently unable  Balance Overall balance assessment: Needs assistance Sitting-balance support: Bilateral  upper extremity supported, Feet supported Sitting balance-Leahy Scale: Fair Sitting balance - Comments: pt sat EOB x ~ 15 minutes during session with supervision only. Feet and BUE support required.   Standing balance support: Bilateral upper extremity supported, During functional activity, Reliant on assistive device for balance Standing balance-Leahy Scale: Zero Standing balance comment: required constant assistance to only stand 1 x ~ 2-3 sec.         Cognition Arousal/Alertness: Awake/alert Behavior During Therapy: WFL for tasks assessed/performed Overall Cognitive Status: Within Functional Limits for tasks assessed        General Comments: PMSV in place. Patient is able to follow commands consistently. Pt was cooperative and pleasant but does get tearful during session due to lack of independnce. Puts forth great effort throughout. Son present and used as +2 assist during session.           General Comments General comments (skin integrity, edema, etc.): Pt performed and tolerated ther ex at EOB and when in supine. LAQ and seated marching while EOB.      Pertinent Vitals/Pain Pain Assessment Pain Assessment: No/denies pain Pain Score: 0-No pain     PT Goals (current goals can now be found in the care plan section) Acute Rehab PT Goals Patient Stated Goal: get better so I can get home Progress towards PT goals: Not progressing toward goals - comment (limited by weakness)    Frequency    Min 2X/week      PT Plan Current plan remains appropriate    Co-evaluation     PT goals addressed during session: Mobility/safety with mobility;Strengthening/ROM;Balance;Proper use of DME        AM-PAC PT "6 Clicks" Mobility   Outcome Measure  Help needed turning from your back to your side while in a flat bed without using bedrails?: A Lot Help needed moving from lying on your back to sitting on the side of a flat bed without using bedrails?: A Lot Help needed moving to  and from a bed to a chair (including a wheelchair)?: Total Help needed standing up from a chair using your arms (e.g., wheelchair or bedside chair)?: Total Help needed to walk in hospital room?: Total Help needed climbing 3-5 steps with a railing? : Total 6 Click Score: 8    End of Session Equipment Utilized During Treatment: Oxygen (trach collar) Activity Tolerance: Patient tolerated treatment well;Patient limited by fatigue;Other (comment) (limited by weakness) Patient left: in bed;with call bell/phone within reach;with family/visitor present Nurse Communication: Mobility status PT Visit Diagnosis: Muscle weakness (generalized) (M62.81);Other abnormalities of gait and mobility (R26.89)     Time: 9371-6967 PT Time Calculation (min) (ACUTE ONLY): 28 min  Charges:  $Therapeutic Exercise: 8-22 mins $Therapeutic Activity: 8-22 mins                    Julaine Fusi PTA 03/29/22, 3:42 PM

## 2022-03-30 DIAGNOSIS — J9601 Acute respiratory failure with hypoxia: Secondary | ICD-10-CM | POA: Diagnosis not present

## 2022-03-30 DIAGNOSIS — J9602 Acute respiratory failure with hypercapnia: Secondary | ICD-10-CM | POA: Diagnosis not present

## 2022-03-30 DIAGNOSIS — E1169 Type 2 diabetes mellitus with other specified complication: Secondary | ICD-10-CM | POA: Diagnosis not present

## 2022-03-30 LAB — BASIC METABOLIC PANEL
Anion gap: 7 (ref 5–15)
BUN: 7 mg/dL (ref 6–20)
CO2: 35 mmol/L — ABNORMAL HIGH (ref 22–32)
Calcium: 8.5 mg/dL — ABNORMAL LOW (ref 8.9–10.3)
Chloride: 100 mmol/L (ref 98–111)
Creatinine, Ser: 0.56 mg/dL — ABNORMAL LOW (ref 0.61–1.24)
GFR, Estimated: >60 mL/min (ref >60)
Glucose, Bld: 103 mg/dL — ABNORMAL HIGH (ref 70–99)
Potassium: 4.2 mmol/L (ref 3.5–5.1)
Sodium: 142 mmol/L (ref 135–145)

## 2022-03-30 LAB — MAGNESIUM: Magnesium: 1.6 mg/dL — ABNORMAL LOW (ref 1.7–2.4)

## 2022-03-30 LAB — CBC
HCT: 29.6 % — ABNORMAL LOW (ref 39.0–52.0)
Hemoglobin: 8.5 g/dL — ABNORMAL LOW (ref 13.0–17.0)
MCH: 26.9 pg (ref 26.0–34.0)
MCHC: 28.7 g/dL — ABNORMAL LOW (ref 30.0–36.0)
MCV: 93.7 fL (ref 80.0–100.0)
Platelets: 265 10*3/uL (ref 150–400)
RBC: 3.16 MIL/uL — ABNORMAL LOW (ref 4.22–5.81)
RDW: 16.9 % — ABNORMAL HIGH (ref 11.5–15.5)
WBC: 7.3 10*3/uL (ref 4.0–10.5)
nRBC: 0 % (ref 0.0–0.2)

## 2022-03-30 LAB — GLUCOSE, CAPILLARY
Glucose-Capillary: 113 mg/dL — ABNORMAL HIGH (ref 70–99)
Glucose-Capillary: 128 mg/dL — ABNORMAL HIGH (ref 70–99)
Glucose-Capillary: 115 mg/dL — ABNORMAL HIGH (ref 70–99)
Glucose-Capillary: 150 mg/dL — ABNORMAL HIGH (ref 70–99)

## 2022-03-30 MED ORDER — MAGNESIUM SULFATE 4 GM/100ML IV SOLN
4.0000 g | Freq: Once | INTRAVENOUS | Status: AC
  Administered 2022-03-30: 4 g via INTRAVENOUS
  Filled 2022-03-30: qty 100

## 2022-03-30 NOTE — Progress Notes (Signed)
PROGRESS NOTE    Samuel Chavez  JJO:[id number] DOB: [birth year] DOA: 02/11/2022 PCP: [facility]  Assessment & Plan:   Principal Problem:   Acute respiratory failure with hypoxia and hypercarbia ([place]) Active Problems:   Acute on chronic systolic CHF (congestive heart failure) (HCC)   Hypertensive urgency   Type 2 diabetes mellitus with peripheral neuropathy (HCC)   Acute heart failure with preserved ejection fraction (HCC)   Renal failure (ARF), acute on chronic (HCC)   Hospital-acquired pneumonia   ARDS (adult respiratory distress syndrome) (HCC)   Pressure injury of skin   Constipation  Assessment and Plan: Acute on chronic hypoxemic and hypercapneic respiratory failure: s/p tracheostomy. Hx of OSA/OHS. Continue w/ trach collar & supplemental oxygen    MRSA HCAP: completed linezolid course.  Provetella sinusitis & has completed unasyn/augmentin. ID signed off  Muffled hearing: of left eye. Secondary to wax build up. S/p ear flushing but pt still c/o muffled hearing. Continue w/ ear wax drops   AKI: resolved   Hypernatremia: WNL. Continue w/ free water flushes  Constipation: lactulose prn   Hyperkalemia: resolved  Hypomagnesemia: magnesium sulfate given   DM2: likely poorly controlled. Continue on levemir, SSI w/ accuchecks. Tube feeds have been d/c. Per surgery, gastric tube sutures taken off & do NOT recommend removing the tube yet since the track hasnt matured yet.  Give it another 2-3 weeks before considering removal as per gen surg    Normocytic anemia: H&H are stable. 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 EEG 8/16 neg. Still waiting on LTAC placement    PICC associated DVT: PICC removed. Continue on eliquis    Depression: severity unknown. Continue on lexapro    Left leg pain: w/ hx of sciatica as per pt.  LLE [place] was neg for DVT. Continue  flexeril, gabapentin   Morbidly obese: BMI 62.1. Complicates overall care & prognosis      DVT prophylaxis: eliquis  Code Status: full  Family Communication: discussed pt's care w/ pt's family at bedside and answered their questions. Disposition Plan: possibly d/c to LTAC but still waiting on placement  Level of care: Progressive  Status is: Inpatient Remains inpatient appropriate because: waiting on LTAC placement. Medically stable for d/c    Consultants:  ICU Gen surg  Nephro  ID  Cardio   Procedures:   Antimicrobials:    Subjective: Pt c/o fatigue  Objective: Vitals:   03/29/22 2218 03/29/22 2323 03/30/22 0400 03/30/22 0800  BP:  (!) 140/84 125/76   Pulse:  80    Resp: 20 18 15    Temp:  (!) 97.5 F (36.4 C) 98.4 F (36.9 C)   TempSrc:   Axillary   SpO2:  100% 95% 95%  Weight:      Height:        Intake/Output Summary (Last 24 hours) at 03/30/2022 0801 Last data filed at 03/30/2022 0526 Gross per 24 hour  Intake 30 ml  Output 3600 ml  Net -3570 ml   Filed Weights   03/24/22 1508 03/24/22 1714 03/28/22 0500  Weight: (!) 182.8 kg (!) 191 kg (!) 177.8 kg    Examination:  General exam: Appears comfortable. Morbidly obese Respiratory system: diminished breath sounds b/l  Cardiovascular system: S1/S2+. No rubs or clicks  Gastrointestinal system: Abd is soft, NT, obese & hypoactive bowel sounds Central nervous system: Alert and oriented. Moves all extremities  Psychiatry: Judgement and insight  appears at baseline. Flat mood and affect    Data Reviewed: I have personally reviewed following labs and imaging studies  CBC: Recent Labs  Lab 03/25/22 1011 03/27/22 0346 03/28/22 0715 03/29/22 0441 03/30/22 0710  WBC 7.1 8.1 7.1 7.0 7.3  HGB 8.6* 8.3* 9.1* 8.5* 8.5*  HCT 30.6* 28.9* 32.1* 29.8* 29.6*  MCV 94.7 94.8 93.9 94.0 93.7  PLT 286 269 216 253 503   Basic Metabolic Panel: Recent Labs  Lab 03/23/22 0916 03/23/22 1803 03/24/22 0706  03/25/22 1011 03/27/22 0346 03/28/22 0715 03/29/22 0441 03/30/22 0710  NA 138  --   --   --  137 138 139 142  K 5.3*   < > 5.1 4.8 4.4 4.3 4.1 4.2  CL 103  --   --   --  98 98 98 100  CO2 31  --   --   --  32 32 33* 35*  GLUCOSE 128*  --   --   --  154* 121* 117* 103*  BUN 30*  --   --   --  13 13 10 7   CREATININE 0.68  --   --   --  0.63 0.60* 0.59* 0.56*  CALCIUM 8.3*  --   --   --  8.3* 8.6* 8.5* 8.5*  MG 1.3*  --  1.6*  --  1.3* 1.5* 1.7 1.6*  PHOS 2.5  --   --   --   --   --   --   --    < > = values in this interval not displayed.   GFR: Estimated Creatinine Clearance: 189.2 mL/min (A) (by C-G formula based on SCr of 0.56 mg/dL (L)). Liver Function Tests: No results for input(s): "AST", "ALT", "ALKPHOS", "BILITOT", "PROT", "ALBUMIN" in the last 168 hours.  No results for input(s): "LIPASE", "AMYLASE" in the last 168 hours. No results for input(s): "AMMONIA" in the last 168 hours. Coagulation Profile: No results for input(s): "INR", "PROTIME" in the last 168 hours. Cardiac Enzymes: No results for input(s): "CKTOTAL", "CKMB", "CKMBINDEX", "TROPONINI" in the last 168 hours. BNP (last 3 results) No results for input(s): "PROBNP" in the last 8760 hours. HbA1C: No results for input(s): "HGBA1C" in the last 72 hours. CBG: Recent Labs  Lab 03/28/22 2121 03/29/22 0731 03/29/22 1127 03/29/22 1647 03/29/22 2127  GLUCAP 161* 107* 167* 137* 124*   Lipid Profile: No results for input(s): "CHOL", "HDL", "LDLCALC", "TRIG", "CHOLHDL", "LDLDIRECT" in the last 72 hours. Thyroid Function Tests: No results for input(s): "TSH", "T4TOTAL", "FREET4", "T3FREE", "THYROIDAB" in the last 72 hours. Anemia Panel: No results for input(s): "VITAMINB12", "FOLATE", "FERRITIN", "TIBC", "IRON", "RETICCTPCT" in the last 72 hours. Sepsis Labs: No results for input(s): "PROCALCITON", "LATICACIDVEN" in the last 168 hours.  No results found for this or any previous visit (from the past 240 hour(s)).        Radiology Studies: No results found.      Scheduled Meds:  apixaban  5 mg Oral BID   carbamide peroxide  5 drop Left EAR BID   carvedilol  6.25 mg Oral BID WC   Chlorhexidine Gluconate Cloth  6 each Topical Q0600   docusate sodium  100 mg Oral BID   escitalopram  10 mg Oral Daily   feeding supplement  237 mL Oral TID BM   free water  30 mL Per Tube BID   gabapentin  600 mg Oral BID   insulin aspart  0-20 Units Subcutaneous TID AC   insulin  detemir  22 Units Subcutaneous BID   ipratropium-albuterol  3 mL Nebulization TID   lidocaine  1 patch Transdermal Q24H   multivitamin with minerals  1 tablet Oral Daily   mupirocin ointment   Nasal BID   nutrition supplement (JUVEN)  1 packet Oral BID BM   pantoprazole  40 mg Oral Daily   polyethylene glycol  17 g Oral Daily   Continuous Infusions:  sodium chloride 10 mL/hr at 03/14/22 1036   magnesium sulfate bolus IVPB       LOS: 47 days    Time spent: 25 mins     Wyvonnia Dusky, MD [facility] Pager [phone]  If 7PM-7AM, please contact night-coverage [email or web address] 03/30/2022, 8:01 AM

## 2022-03-31 LAB — BASIC METABOLIC PANEL
Anion gap: 6 (ref 5–15)
BUN: 11 mg/dL (ref 6–20)
CO2: 34 mmol/L — ABNORMAL HIGH (ref 22–32)
Calcium: 8.5 mg/dL — ABNORMAL LOW (ref 8.9–10.3)
Chloride: 98 mmol/L (ref 98–111)
Creatinine, Ser: 0.65 mg/dL (ref 0.61–1.24)
GFR, Estimated: >60 mL/min (ref >60)
Glucose, Bld: 149 mg/dL — ABNORMAL HIGH (ref 70–99)
Potassium: 4 mmol/L (ref 3.5–5.1)
Sodium: 138 mmol/L (ref 135–145)

## 2022-03-31 LAB — CBC
HCT: 30.1 % — ABNORMAL LOW (ref 39.0–52.0)
Hemoglobin: 8.7 g/dL — ABNORMAL LOW (ref 13.0–17.0)
MCH: 27.4 pg (ref 26.0–34.0)
MCHC: 28.9 g/dL — ABNORMAL LOW (ref 30.0–36.0)
MCV: 95 fL (ref 80.0–100.0)
Platelets: 253 10*3/uL (ref 150–400)
RBC: 3.17 MIL/uL — ABNORMAL LOW (ref 4.22–5.81)
RDW: 17 % — ABNORMAL HIGH (ref 11.5–15.5)
WBC: 8.2 10*3/uL (ref 4.0–10.5)
nRBC: 0 % (ref 0.0–0.2)

## 2022-03-31 LAB — GLUCOSE, CAPILLARY
Glucose-Capillary: 144 mg/dL — ABNORMAL HIGH (ref 70–99)
Glucose-Capillary: 153 mg/dL — ABNORMAL HIGH (ref 70–99)
Glucose-Capillary: 122 mg/dL — ABNORMAL HIGH (ref 70–99)

## 2022-03-31 LAB — MAGNESIUM: Magnesium: 1.7 mg/dL (ref 1.7–2.4)

## 2022-03-31 MED ORDER — IPRATROPIUM-ALBUTEROL 0.5-2.5 (3) MG/3ML IN SOLN
3.0000 mL | Freq: Four times a day (QID) | RESPIRATORY_TRACT | Status: DC | PRN
  Administered 2022-04-01 – 2022-04-04 (×2): 3 mL via RESPIRATORY_TRACT
  Filled 2022-03-31 (×2): qty 3

## 2022-03-31 NOTE — Progress Notes (Signed)
Occupational Therapy Treatment Patient Details Name: Samuel Chavez MRN: [id number] DOB: 1976/06/27 Today's Date: 03/31/2022   History of present illness Pt is 82 YOM admitted for PNA, renail failure, AKI, hyperkalemia, CHF, Resp failure, sepsis, and is s/p prolonged ventilation, trach placement. PMH includes: DM, HTN, sCHF, OSA, obesity, ARDS Resp failure, sepsis.   OT comments  Samuel Chavez was seen for OT treatment on this date. Upon arrival to room pt reclined in bed, family at bed side, agreeable to tx. Pt requires MIN / MOD A x2 exit bed, assist for trunk elevation. MIN A don/doff gown in sitting. MAX A don/doff socks in sitting - good dynamic sitting balance. MOD A x2 + HHA sit<>stand x3 trials, poor tolerance. Pt eager to walk. Pt making good progress toward goals, will continue to follow POC. Discharge recommendation remains appropriate, if continues to participate and improve may I be appropriate for CIR.    Recommendations for follow up therapy are one component of a multi-disciplinary discharge planning process, led by the attending physician.  Recommendations may be updated based on patient status, additional functional criteria and insurance authorization.    Follow Up Recommendations  OT at Long-term acute care hospital    Assistance Recommended at Discharge Frequent or constant Supervision/Assistance  Patient can return home with the following  Two people to help with walking and/or transfers;Two people to help with bathing/dressing/bathroom   Equipment Recommendations  Other (comment)    Recommendations for Other Services      Precautions / Restrictions Precautions Precautions: Fall Restrictions Weight Bearing Restrictions: No       Mobility Bed Mobility Overal bed mobility: Needs Assistance Bed Mobility: Supine to Sit     Supine to sit: Min assist, Mod assist, +2 for physical assistance, HOB elevated          Transfers Overall transfer level: Needs  assistance Equipment used: 2 person hand held assist Transfers: Sit to/from Stand Sit to Stand: Mod assist, +2 physical assistance                 Balance Overall balance assessment: Needs assistance Sitting-balance support: Feet supported, Single extremity supported Sitting balance-Leahy Scale: Good     Standing balance support: Bilateral upper extremity supported Standing balance-Leahy Scale: Poor                             ADL either performed or assessed with clinical judgement   ADL Overall ADL's : Needs assistance/impaired                                       General ADL Comments: MIN A don/doff gown in sitting. MAX A don/doff socks in sitting - good dynamic sitting balance. MOD A x2 standing, poor tolerance      Cognition Arousal/Alertness: Awake/alert Behavior During Therapy: WFL for tasks assessed/performed Overall Cognitive Status: Within Functional Limits for tasks assessed                                 General Comments: pleasant and joking around, express dissapointment in self that he is unable to stand/walk                   Pertinent Vitals/ Pain       Pain Assessment Pain Assessment: No/denies  pain   Frequency  Min 2X/week        Progress Toward Goals  OT Goals(current goals can now be found in the care plan section)  Progress towards OT goals: Progressing toward goals  Acute Rehab OT Goals Patient Stated Goal: to walk OT Goal Formulation: With patient/family Time For Goal Achievement: 04/10/22 Potential to Achieve Goals: Fair ADL Goals Pt Will Perform Grooming: with min assist;bed level Pt Will Transfer to Toilet: with max assist;with +2 assist Pt/caregiver will Perform Home Exercise Program: Increased ROM;Increased strength;Right Upper extremity;Left upper extremity;With minimal assist  Plan Discharge plan remains appropriate;Frequency remains appropriate (may improve to CIR)     Co-evaluation    PT/OT/SLP Co-Evaluation/Treatment: Yes Reason for Co-Treatment: For patient/therapist safety;To address functional/ADL transfers PT goals addressed during session: Mobility/safety with mobility OT goals addressed during session: ADL's and self-care      AM-PAC OT "6 Clicks" Daily Activity     Outcome Measure   Help from another person eating meals?: A Little Help from another person taking care of personal grooming?: A Little Help from another person toileting, which includes using toliet, bedpan, or urinal?: A Lot Help from another person bathing (including washing, rinsing, drying)?: A Lot Help from another person to put on and taking off regular upper body clothing?: A Little Help from another person to put on and taking off regular lower body clothing?: A Lot 6 Click Score: 15    End of Session    OT Visit Diagnosis: Muscle weakness (generalized) (M62.81);Other abnormalities of gait and mobility (R26.89)   Activity Tolerance Patient tolerated treatment well   Patient Left in chair;with call bell/phone within reach   Nurse Communication Mobility status        Time: 8592-9244 OT Time Calculation (min): 37 min  Charges: OT General Charges $OT Visit: 1 Visit OT Treatments $Self Care/Home Management : 8-22 mins  Dessie Coma, M.S. OTR/L  03/31/22, 3:55 PM  ascom [phone]

## 2022-03-31 NOTE — Progress Notes (Addendum)
Speech Language Pathology Treatment: Dysphagia  Patient Details Name: Samuel Chavez MRN: [id number] DOB: [birth year] Today's Date: 03/31/2022 Time: 1320-1400 SLP Time Calculation (min) (ACUTE ONLY): 40 min  Assessment / Plan / Recommendation Clinical Impression  Pt seen for PMV tx session today; PMV wear/use/care as well as assessment of pt's vocal quality and breath support now for verbal communication w/ others while wearing the PMV. Pt initially had reduced Stamina and breath support at the phrase level when wearing the PMV. Pt has been wearing the PMV during waking hours, and w/ other therapies. He is still awaiting LTACH placement per MD note. Pt had been wearing the PMV while sleeping per chart notes; this, and the risks, were discussed w/ pt and pt agreed to not wear it while sleeping.    Pt continues on TC O2 support. He has weaned from nocturnal vent support. Pt is alert, more verbal and engaging w/ others and w/ this therapist during session today. He is no longer using gestures(head nodding) but instead is using verbal communication -- noted he was talking on the phone upon entering the room.     Explained the use and wear of the PMV again to pt; trach and stoma area inspected; HR 80s, O2 sats upper 90s%; RR ~20. FiO2 28% at 5L per chart. PMV in place; RR/O2 sats remained at/close to his baseline throughout the session.  Pt verbally communicated w/ this therapist during conversations re: his Son/children and places he likes to go eat -- one in [facility] where he described a certain meal/food he enjoys there. Verbalizations were c/b adequate volume and breath support for speech/responses. He conversed at the sentence level in conversation. Intelligibility was complete w/ much improved breath support for volume now. Vocal quality appropriate.  Pt denied any SOB or difficulty making himself understood/heard by others in room or on the phone. Encouraged him to continue to Sit Upright more for  conversations to best support his breathing/talking, vs lying on his back (heaviness of gravity on him). Pt stated No discomfort in his breathing or wear of the PMV during the day. No increased effort noted in respirations during conversation this session; no overt use of accessary muscles or distress was noted in his breathing pattern.  Education provided on PMV wear and care. Pt still needs some support w/ donning/doffing the PMV -- NSG staff to support. Encouraged use of an incentive spirometer to continue to improve breath support for communication.    Pt appears to adequately tolerate PMV placement w/out overt respiratory discomfort or distress; ANS remained stable at his Baseline during wear/use. Suspect the Shiley trach size, #8, XLT is adequate for comfortable PMV wear/use BUT pt could possibly benefit from downsizing of the trach to a #6 to continue his weaning process -- requires ENT f/u to make this assessment. MD consulted to request ENT f/u.  Education provided on PMV use/wear, MUST have Cuff deflation for PMV wear, checking and removing the air from the balloon b/f placing, placing/removing the PMV, and care of the PMV.  Discussed that PMV can be worn during the day/waking hours and w/ therapies, but it must NOT be worn when sleeping, drowsy. Encouraged Rest Breaks at times during the day as needed. It MUST be worn when eating/drinking. Precautions posted at bedside and in chart.  Pt remains w/ a Cuffed trach during his wean at this time -- must be fully deflated for PMV placement. Sticker placed on Cuff line, and in room. NSG made aware.  MD updated. ST services can be available if any further needs while admitted. MD to reconsult.         HPI HPI: Pt is 35 YOM admitted for PNA, renail failure, AKI, hyperkalemia, CHF, Resp failure, sepsis, and is s/p trach placement on 02/25/22 -- #8 shiley XLT.   PMH includes: Morbid Obesity, ARDS, type 2 diabetes mellitus, hypertension, pancreatitis,  ongoing tobacco abuse, systolic CHF, OSA, type 2 diabetes mellitus and diabetic neuropathy, lumbar radiculopathy.  Admitting DXs: Acute respiratory failure with hypoxia and hypercarbia. Prior to hospital admission, pt was Independent for mobility and ADLs. Pt lives with family, communication limited to yes/no with head nods 2/2 tracheostomy.   CXR on 8/16: mild pulmonary edema.      SLP Plan  All goals met      Recommendations for follow up therapy are one component of a multi-disciplinary discharge planning process, led by the attending physician.  Recommendations may be updated based on patient status, additional functional criteria and insurance authorization.    Recommendations  Diet recommendations: Regular;Thin liquid Liquids provided via: Cup;Straw Medication Administration: Whole meds with puree (for safer swallowing) Supervision: Patient able to self feed;Staff to assist with self feeding;Intermittent supervision to cue for compensatory strategies Compensations: Minimize environmental distractions;Slow rate;Small sips/bites;Lingual sweep for clearance of pocketing;Multiple dry swallows after each bite/sip;Follow solids with liquid Postural Changes and/or Swallow Maneuvers: Out of bed for meals;Seated upright 90 degrees;Upright 30-60 min after meal      Patient may use Passy-Muir Speech Valve: During all therapies with supervision;During all waking hours (remove during sleep);During PO intake/meals PMSV Supervision: Intermittent MD: Please consider changing trach tube to : Smaller size;Cuffless (contacted MD to consult ENT)         General recommendations:  (PT/OT following) Oral Care Recommendations: Oral care BID;Oral care before and after PO;Staff/trained caregiver to provide oral care Follow Up Recommendations: Long-term institutional care without follow-up therapy (TBD - see PT recs) Assistance recommended at discharge: Intermittent Supervision/Assistance (feeding support;  PMV cleaning support) SLP Visit Diagnosis: Aphonia (R49.1) (tracheostomy) Plan: All goals met              Orinda Kenner, [place], [place]; [place] [phone] (ascom) Lillian Tigges  03/31/2022, 2:56 PM

## 2022-03-31 NOTE — Progress Notes (Signed)
PROGRESS NOTE    Samuel Chavez  JKD:[id number] DOB: 1976/02/13 DOA: 02/11/2022 PCP: [facility]  Assessment & Plan:   Principal Problem:   Acute respiratory failure with hypoxia and hypercarbia ([place]) Active Problems:   Acute on chronic systolic CHF (congestive heart failure) (HCC)   Hypertensive urgency   Type 2 diabetes mellitus with peripheral neuropathy (HCC)   Acute heart failure with preserved ejection fraction (HCC)   Renal failure (ARF), acute on chronic (HCC)   Hospital-acquired pneumonia   ARDS (adult respiratory distress syndrome) (HCC)   Pressure injury of skin   Constipation  Assessment and Plan: Acute on chronic hypoxemic and hypercapneic respiratory failure: s/p tracheostomy. Hx of OSA/OHS. Continue w/ trach collar & supplemental oxygen    MRSA HCAP: completed linezolid course.  Provetella sinusitis & has completed unasyn/augmentin. ID signed off  Muffled hearing: of left eye. Secondary to wax build up. S/p ear flushing but pt still c/o muffled hearing. Continue w/ ear wax drops    AKI: resolved   Hypernatremia: WNL. Continue w/ free water flushes   Constipation: lactulose prn   Hyperkalemia: resolved  Hypomagnesemia: WNL today    DM2: likely poorly controlled. Continue on levemir, SSI w/ accuchecks. Tube feeds have been d/c. Per surgery, gastric tube sutures taken off & do NOT recommend removing the tube yet since the track hasnt matured yet.  Give it another 2-3 weeks before considering removal as per gen surg    Normocytic anemia: H&H are stable. 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 EEG 8/16 neg. Still waiting on LTAC placement. CM is working on this  PICC associated DVT: PICC removed. Continue on eliquis    Depression: severity unknown.  Continue on lexapro    Left leg pain: w/ hx of sciatica as per pt.  LLE [place] was neg for DVT. Continue  flexeril, gabapentin   Morbidly obese: BMI 57.8. Complicates overall care & prognosis      DVT prophylaxis: eliquis  Code Status: full  Family Communication:  family is at bedside but has no questions  Disposition Plan: possibly d/c to [facility] but still waiting on placement.   Level of care: Progressive  Status is: Inpatient Remains inpatient appropriate because: waiting on LTAC placement. Medically stable for d/c    Consultants:  ICU Gen surg  Nephro  ID  Cardio   Procedures:   Antimicrobials:    Subjective: Pt denies any complaints   Objective: Vitals:   03/30/22 2106 03/30/22 2134 03/31/22 0015 03/31/22 0351  BP: (!) 151/76  (!) 144/91 (!) 147/83  Pulse:   86 87  Resp: 18  16 18   Temp: 99.2 F (37.3 C)  98.1 F (36.7 C) 98 F (36.7 C)  TempSrc: Oral  Oral Oral  SpO2: 95% 100% 97% 100%  Weight:      Height:        Intake/Output Summary (Last 24 hours) at 03/31/2022 0752 Last data filed at 03/31/2022 0401 Gross per 24 hour  Intake 127.6 ml  Output 1900 ml  Net -1772.4 ml   Filed Weights   03/24/22 1508 03/24/22 1714 03/28/22 0500  Weight: (!) 182.8 kg (!) 191 kg (!) 177.8 kg    Examination:  General exam: Appears calm & comfortable. Morbidly obese Respiratory system: decreased breath sounds b/l  Cardiovascular system: S1 & S2+. No rubs or gallops  Gastrointestinal system: Abd is soft, NT, obese & hypoactive bowel sounds  Central nervous system: alert and oriented. Moves all extremities  Psychiatry: Judgement and insight appears at baseline. Flat mood and affect     Data Reviewed: I have personally reviewed following labs and imaging studies  CBC: Recent Labs  Lab 03/27/22 0346 03/28/22 0715 03/29/22 0441 03/30/22 0710 03/31/22 0540  WBC 8.1 7.1 7.0 7.3 8.2  HGB 8.3* 9.1* 8.5* 8.5* 8.7*  HCT 28.9* 32.1* 29.8* 29.6* 30.1*  MCV 94.8 93.9 94.0 93.7 95.0  PLT 269 216 253 265 449   Basic Metabolic Panel: Recent Labs  Lab 03/27/22 0346  03/28/22 0715 03/29/22 0441 03/30/22 0710 03/31/22 0540  NA 137 138 139 142 138  K 4.4 4.3 4.1 4.2 4.0  CL 98 98 98 100 98  CO2 32 32 33* 35* 34*  GLUCOSE 154* 121* 117* 103* 149*  BUN 13 13 10 7 11   CREATININE 0.63 0.60* 0.59* 0.56* 0.65  CALCIUM 8.3* 8.6* 8.5* 8.5* 8.5*  MG 1.3* 1.5* 1.7 1.6* 1.7   GFR: Estimated Creatinine Clearance: 189.2 mL/min (by C-G formula based on SCr of 0.65 mg/dL). Liver Function Tests: No results for input(s): "AST", "ALT", "ALKPHOS", "BILITOT", "PROT", "ALBUMIN" in the last 168 hours.  No results for input(s): "LIPASE", "AMYLASE" in the last 168 hours. No results for input(s): "AMMONIA" in the last 168 hours. Coagulation Profile: No results for input(s): "INR", "PROTIME" in the last 168 hours. Cardiac Enzymes: No results for input(s): "CKTOTAL", "CKMB", "CKMBINDEX", "TROPONINI" in the last 168 hours. BNP (last 3 results) No results for input(s): "PROBNP" in the last 8760 hours. HbA1C: No results for input(s): "HGBA1C" in the last 72 hours. CBG: Recent Labs  Lab 03/29/22 2127 03/30/22 0926 03/30/22 1250 03/30/22 1613 03/30/22 2052  GLUCAP 124* 113* 128* 115* 150*   Lipid Profile: No results for input(s): "CHOL", "HDL", "LDLCALC", "TRIG", "CHOLHDL", "LDLDIRECT" in the last 72 hours. Thyroid Function Tests: No results for input(s): "TSH", "T4TOTAL", "FREET4", "T3FREE", "THYROIDAB" in the last 72 hours. Anemia Panel: No results for input(s): "VITAMINB12", "FOLATE", "FERRITIN", "TIBC", "IRON", "RETICCTPCT" in the last 72 hours. Sepsis Labs: No results for input(s): "PROCALCITON", "LATICACIDVEN" in the last 168 hours.  No results found for this or any previous visit (from the past 240 hour(s)).       Radiology Studies: No results found.      Scheduled Meds:  apixaban  5 mg Oral BID   carbamide peroxide  5 drop Left EAR BID   carvedilol  6.25 mg Oral BID WC   Chlorhexidine Gluconate Cloth  6 each Topical Q0600   docusate  sodium  100 mg Oral BID   escitalopram  10 mg Oral Daily   feeding supplement  237 mL Oral TID BM   free water  30 mL Per Tube BID   gabapentin  600 mg Oral BID   insulin aspart  0-20 Units Subcutaneous TID AC   insulin detemir  22 Units Subcutaneous BID   ipratropium-albuterol  3 mL Nebulization TID   lidocaine  1 patch Transdermal Q24H   multivitamin with minerals  1 tablet Oral Daily   mupirocin ointment   Nasal BID   nutrition supplement (JUVEN)  1 packet Oral BID BM   pantoprazole  40 mg Oral Daily   polyethylene glycol  17 g Oral Daily   Continuous Infusions:  sodium chloride 10 mL/hr at 03/14/22 1036     LOS: 48 days    Time spent: 20 mins     Wyvonnia Dusky, MD [facility]  Pager [phone]  If 7PM-7AM, please contact night-coverage [email or web address] 03/31/2022, 7:52 AM

## 2022-03-31 NOTE — Progress Notes (Addendum)
Physical Therapy Treatment Patient Details Name: Samuel Chavez MRN: [id number] DOB: 03/11/1976 Today's Date: 03/31/2022   History of Present Illness Pt is 89 YOM admitted for PNA, renail failure, AKI, hyperkalemia, CHF, Resp failure, sepsis, and is s/p prolonged ventilation, trach placement. PMH includes: DM, HTN, sCHF, OSA, obesity, ARDS Resp failure, sepsis.    PT Comments    Patient alert and motivated to participate/progress this date, expressing goal of working on sit/stand and standing tolerance.  Completes bed mobility with mod assist +2; unsupported sitting balance with close sup; sit/stand from edge of bed (with bilat UE support), mod assist +2 for very brief periods of sit/stand, maintaining only 2-3 seconds each repetition.  Excellent effort with all mobility trials; however, patient notably frustrated with inability to stand longer/more independently.  Tolerated mechanical lift from bed/chair without difficulty; positioned to comfort in chair end of session.  Encouraged to set/accomplish 'small goals' that will build into 'big goals' as he continues recovery; provided  with continuous support and encouragement.  Discussed implementation of daily schedule/routine with emphasis on OOB/mobility/self-care efforts; patient in agreement and willing to participate.  Goals and POC updated to reflect progress.      Recommendations for follow up therapy are one component of a multi-disciplinary discharge planning process, led by the attending physician.  Recommendations may be updated based on patient status, additional functional criteria and insurance authorization.  Follow Up Recommendations  PT at Long-term acute care hospital (if participation/effort continues, may consider update to AIR) Can patient physically be transported by private vehicle: No   Assistance Recommended at Discharge Frequent or constant Supervision/Assistance  Patient can return home with the following Two  people to help with walking and/or transfers;Two people to help with bathing/dressing/bathroom;Direct supervision/assist for medications management;Help with stairs or ramp for entrance;Assist for transportation;Assistance with feeding;Assistance with cooking/housework;Direct supervision/assist for financial management   Equipment Recommendations       Recommendations for Other Services       Precautions / Restrictions Precautions Precautions: Fall Precaution Comments: trach with PMV, PEG Restrictions Weight Bearing Restrictions: No     Mobility  Bed Mobility Overal bed mobility: Needs Assistance Bed Mobility: Supine to Sit     Supine to sit: Min assist, Mod assist, +2 for physical assistance, HOB elevated          Transfers Overall transfer level: Needs assistance Equipment used: 2 person hand held assist Transfers: Bed to chair/wheelchair/BSC Sit to Stand: Mod assist, +2 physical assistance           General transfer comment: sit/stand from edge of bed, mod assist +2 for lift off. Unable to achieve fully upright posture, but demonstrates excellent effort with each repetition (maintaining 2-3 seconds each trial) Transfer via Lift Equipment:  (overhead ceiling lift in room)  Ambulation/Gait               General Gait Details: unsafe/unable   Stairs             Wheelchair Mobility    Modified Rankin (Stroke Patients Only)       Balance                                            Cognition Arousal/Alertness: Awake/alert Behavior During Therapy: WFL for tasks assessed/performed Overall Cognitive Status: Within Functional Limits for tasks assessed  General Comments: Pleasant, cooperative and eager to participate; notably frustrated/discouraged with functional deficits and inability to stand        Exercises Other Exercises Other Exercises: Dynamic sitting balance/weight  shifting activities at edge of bed, cga/close sup-cuing for closed-chain WBing and stabilization through bilat LEs with weight shift outside immediate BOS.  Increased difficulty with R UE reaching (due to weakness)    General Comments        Pertinent Vitals/Pain Pain Assessment Pain Assessment: Faces Faces Pain Scale: Hurts little more Pain Location: back, L LE/hip (baseline sciatica per patient) Pain Descriptors / Indicators: Discomfort Pain Intervention(s): Limited activity within patient's tolerance, Monitored during session, Repositioned    Home Living                          Prior Function            PT Goals (current goals can now be found in the care plan section) Acute Rehab PT Goals Patient Stated Goal: get better so I can get home PT Goal Formulation: With patient Time For Goal Achievement: 04/14/22 Progress towards PT goals: Progressing toward goals    Frequency    Min 2X/week      PT Plan Current plan remains appropriate    Co-evaluation PT/OT/SLP Co-Evaluation/Treatment: Yes Reason for Co-Treatment: Complexity of the patient's impairments (multi-system involvement);For patient/therapist safety;To address functional/ADL transfers PT goals addressed during session: Mobility/safety with mobility OT goals addressed during session: ADL's and self-care      AM-PAC PT "6 Clicks" Mobility   Outcome Measure  Help needed turning from your back to your side while in a flat bed without using bedrails?: A Lot Help needed moving from lying on your back to sitting on the side of a flat bed without using bedrails?: A Lot Help needed moving to and from a bed to a chair (including a wheelchair)?: Total Help needed standing up from a chair using your arms (e.g., wheelchair or bedside chair)?: Total Help needed to walk in hospital room?: Total Help needed climbing 3-5 steps with a railing? : Total 6 Click Score: 8    End of Session Equipment Utilized  During Treatment: Oxygen Activity Tolerance: Patient tolerated treatment well;Patient limited by fatigue;Other (comment) Patient left: with call bell/phone within reach;in chair;with chair alarm set;with family/visitor present Nurse Communication: Mobility status PT Visit Diagnosis: Muscle weakness (generalized) (M62.81);Other abnormalities of gait and mobility (R26.89)     Time: 1423-1500 PT Time Calculation (min) (ACUTE ONLY): 37 min  Charges:  $Therapeutic Activity: 23-37 mins                     [place]mira Olkowski H. Owens Shark, PT, DPT, NCS 03/31/22, 4:46 PM [phone]

## 2022-04-01 DIAGNOSIS — J9602 Acute respiratory failure with hypercapnia: Secondary | ICD-10-CM | POA: Diagnosis not present

## 2022-04-01 DIAGNOSIS — E1169 Type 2 diabetes mellitus with other specified complication: Secondary | ICD-10-CM | POA: Diagnosis not present

## 2022-04-01 DIAGNOSIS — J9601 Acute respiratory failure with hypoxia: Secondary | ICD-10-CM | POA: Diagnosis not present

## 2022-04-01 LAB — CBC
HCT: 32.4 % — ABNORMAL LOW (ref 39.0–52.0)
Hemoglobin: 9.2 g/dL — ABNORMAL LOW (ref 13.0–17.0)
MCH: 26.7 pg (ref 26.0–34.0)
MCHC: 28.4 g/dL — ABNORMAL LOW (ref 30.0–36.0)
MCV: 93.9 fL (ref 80.0–100.0)
Platelets: 264 10*3/uL (ref 150–400)
RBC: 3.45 MIL/uL — ABNORMAL LOW (ref 4.22–5.81)
RDW: 17.3 % — ABNORMAL HIGH (ref 11.5–15.5)
WBC: 8.2 10*3/uL (ref 4.0–10.5)
nRBC: 0.2 % (ref 0.0–0.2)

## 2022-04-01 LAB — BASIC METABOLIC PANEL
Anion gap: 8 (ref 5–15)
BUN: 9 mg/dL (ref 6–20)
CO2: 33 mmol/L — ABNORMAL HIGH (ref 22–32)
Calcium: 8.7 mg/dL — ABNORMAL LOW (ref 8.9–10.3)
Chloride: 97 mmol/L — ABNORMAL LOW (ref 98–111)
Creatinine, Ser: 0.58 mg/dL — ABNORMAL LOW (ref 0.61–1.24)
GFR, Estimated: >60 mL/min (ref >60)
Glucose, Bld: 126 mg/dL — ABNORMAL HIGH (ref 70–99)
Potassium: 4.5 mmol/L (ref 3.5–5.1)
Sodium: 138 mmol/L (ref 135–145)

## 2022-04-01 LAB — GLUCOSE, CAPILLARY
Glucose-Capillary: 112 mg/dL — ABNORMAL HIGH (ref 70–99)
Glucose-Capillary: 148 mg/dL — ABNORMAL HIGH (ref 70–99)
Glucose-Capillary: 153 mg/dL — ABNORMAL HIGH (ref 70–99)
Glucose-Capillary: 123 mg/dL — ABNORMAL HIGH (ref 70–99)

## 2022-04-01 LAB — MAGNESIUM: Magnesium: 1.7 mg/dL (ref 1.7–2.4)

## 2022-04-01 NOTE — TOC Progression Note (Addendum)
Transition of Care [facility]) - Progression Note    Patient Details  Name: Samuel Chavez MRN: [id number] Date of Birth: 11-Oct-1975  Transition of Care [facility]) CM/SW Contact  Beverly Sessions, RN Phone Number: 04/01/2022, 2:09 PM  Clinical Narrative:     - Patient transferred to medical surgical.  New case to this RNCM   - met with patient and bedside and notified him that [facility] is not able to offer a bed -Patient declines for [facility] LTC bed search -Patient states he wishes to go home - notified patient in order to go home he would need  - trach supplies, bariatric bed, hoyer lift, Bariatric WC, Bariatric RW, Bariatric BSC, O2 and suction  - care giver that lives with him, and receives trach education  - Patient states that his daughter Angelica Pou will be able to provide 24/7 care. Phoned daughter Angelica Pou while in the room so she could join the discussion on speaker phone.  Discussed the current level of care the patient is requiring.  She confirms she will be able to provide 24/7 care and can come to the hospital for scheduled trach education - Discussed referral to [facility] for private duty nursing .  Patient and daughter in agreement.  Referral given to Rob at [facility] for review.   Per ENT trach to be changed tomorrow to cuffless.   Therapy changing recommendation to CIR.  CIR admissions coordinator to review and follow updated notes to determine if patient is appropriate.  Per Cailin with CIR need to have trach changed out to #6 or smaller ( can be XLT) cuffless trach.  MD and ENT notified   In the mean time TOC will continue to work on home discharge Referral mae to [facility] for bariatric bed, hoyer lift, Bariatric WC, Bariatric RW, Bariatric BSC, O2.  Will await trach to be switched out tomorrow to determine what orders for home trach supplies will be needed     Expected Discharge Plan:  (TBD) Barriers to Discharge: Continued Medical Work up  Expected Discharge Plan and  Services Expected Discharge Plan:  (TBD)   Discharge Planning Services: CM Consult   Living arrangements for the past 2 months: Single Family Home                                       Social Determinants of Health (SDOH) Interventions    Readmission Risk Interventions     No data to display

## 2022-04-01 NOTE — Progress Notes (Signed)
Physical Therapy Treatment Patient Details Name: Samuel Chavez MRN: [id number] DOB: 1975/11/18 Today's Date: 04/01/2022   History of Present Illness Pt is 45 YOM admitted for PNA, renail failure, AKI, hyperkalemia, CHF, Resp failure, sepsis, and is s/p prolonged ventilation, trach placement. PMH includes: DM, HTN, sCHF, OSA, obesity, ARDS Resp failure, sepsis.    PT Comments    Continued progression in functional ability this date.  Able to achieve full standing position for increased duration (5-10 seconds each trial) this date, mod assist +2; however, patient does report it "feels easier" today. Worked with patient to create daily schedule (posted in room) and daily 'journal' sheet to track progress/set future goals.  Patient receptive to all information and strategies; agreeable to participate to his best ability.  Also issued handout with written/pictorial descriptions (patient remembered and requested therapist provide from previous session) of UE/LE therex to be completed while OOB in chair.  Patient voiced understanding and agreement.  Given sustained progress and participation, consistent improvement in mobility status (and overall effort), discharge recommendation updated.  Patient now recommended for transition to acute inpatient rehab upon discharge for high-intensity, post-acute rehab services.  Per patient and family, all in agreement for eventual discharge home with family support; would benefit from optimal rehab services prior to this transition to maximize patient safety/indep, decreased caregiver burden and optimize family training/education.      Recommendations for follow up therapy are one component of a multi-disciplinary discharge planning process, led by the attending physician.  Recommendations may be updated based on patient status, additional functional criteria and insurance authorization.  Follow Up Recommendations  Acute inpatient rehab (3hours/day) Can patient  physically be transported by private vehicle: No   Assistance Recommended at Discharge Frequent or constant Supervision/Assistance  Patient can return home with the following Two people to help with walking and/or transfers;Two people to help with bathing/dressing/bathroom;Direct supervision/assist for medications management;Help with stairs or ramp for entrance;Assist for transportation;Assistance with feeding;Assistance with cooking/housework;Direct supervision/assist for financial management   Equipment Recommendations       Recommendations for Other Services       Precautions / Restrictions Precautions Precautions: Fall Precaution Comments: trach with PMV, PEG Restrictions Weight Bearing Restrictions: No     Mobility  Bed Mobility Overal bed mobility: Needs Assistance Bed Mobility: Supine to Sit Rolling: Min assist   Supine to sit: Mod assist, +2 for physical assistance     General bed mobility comments: rolling supine to R, min assist for pelvic rotation; good use of UEs to reach/assist with rolling    Transfers Overall transfer level: Needs assistance Equipment used: 2 person hand held assist Transfers: Sit to/from Stand, Bed to chair/wheelchair/BSC Sit to Stand: Mod assist, +2 physical assistance           General transfer comment: sit/stand from edge of bed, mod assist +2 for lift off and standing balance.  Able to achieve full postural extension this date, maintaining each trial 5-10 seconds each today. Transfer via Lift Equipment:  TEFL teacher)  Ambulation/Gait               General Gait Details: unsafe/unable   [employer] [facility] (Stroke Patients Only)       Balance Overall balance assessment: Needs assistance Sitting-balance support: No upper extremity supported, Feet supported Sitting balance-Leahy Scale: Fair     Standing balance support: Bilateral upper extremity  supported Standing  balance-Leahy Scale: Poor                              Cognition Arousal/Alertness: Awake/alert Behavior During Therapy: WFL for tasks assessed/performed Overall Cognitive Status: Within Functional Limits for tasks assessed                                 General Comments: Pleasant and cooperative, motivated to participate/progress as able        Exercises Other Exercises Other Exercises: Rolling supine/R for peri-care/hygiene and pressure relieft, min assist; improved active use of bilat UEs, min assist for pelvic rotation Other Exercises: Worked with patient to build daily schedule/routine (posted in room for patient and care team) to involve OOB activities, self-care activities, leisure activities, seated therex (respiratory, UE/LE therex); patient in agreement to follow moving forward.  Mobility specialist present throughout session to meet/engage with patient for future participation with daily schedule as well. Other Exercises: Also issued daily 'worksheet' for patient to complete during OOB time, identifying daily progress, identifying future goals.  Encouraged to complete daily as a 'journal' of progress and to help drive functional/mobility goals.    General Comments        Pertinent Vitals/Pain Pain Assessment Pain Assessment: Faces Faces Pain Scale: Hurts little more Pain Location: back Pain Descriptors / Indicators: Sore Pain Intervention(s): Limited activity within patient's tolerance, Monitored during session, Repositioned    Home Living                          Prior Function            PT Goals (current goals can now be found in the care plan section) Acute Rehab PT Goals Patient Stated Goal: get better so I can get home PT Goal Formulation: With patient Time For Goal Achievement: 04/14/22 Progress towards PT goals: Progressing toward goals    Frequency    Min 2X/week      PT Plan Discharge  plan needs to be updated    Co-evaluation              AM-PAC PT "6 Clicks" Mobility   Outcome Measure  Help needed turning from your back to your side while in a flat bed without using bedrails?: A Little Help needed moving from lying on your back to sitting on the side of a flat bed without using bedrails?: A Lot Help needed moving to and from a bed to a chair (including a wheelchair)?: Total Help needed standing up from a chair using your arms (e.g., wheelchair or bedside chair)?: Total Help needed to walk in hospital room?: Total Help needed climbing 3-5 steps with a railing? : Total 6 Click Score: 9    End of Session Equipment Utilized During Treatment: Oxygen Activity Tolerance: Patient tolerated treatment well Patient left: in chair;with chair alarm set;with family/visitor present;with call bell/phone within reach Nurse Communication: Mobility status PT Visit Diagnosis: Muscle weakness (generalized) (M62.81);Other abnormalities of gait and mobility (R26.89)     Time: 5427-0623 PT Time Calculation (min) (ACUTE ONLY): 50 min  Charges:  $Therapeutic Activity: 38-52 mins                    Kiren Mcisaac H. Owens Shark, PT, DPT, NCS 04/01/22, 4:18 PM [phone]

## 2022-04-01 NOTE — Progress Notes (Signed)
    Durable Medical Equipment  (From admission, onward)           Start     Ordered   04/01/22 1139  For home use only DME Walker rolling  Once       Comments: Bariatric  Question Answer Comment  Walker: With [place] Wheels   Patient needs a walker to treat with the following condition Weakness      04/01/22 1138   04/01/22 1139  For home use only DME Bedside commode  Once       Comments: Bariatric  Question:  Patient needs a bedside commode to treat with the following condition  Answer:  Weakness   04/01/22 1138   04/01/22 1138  For home use only DME lightweight manual wheelchair with seat cushion  Once       Comments: Bariatric WC   Patient suffers from Respiratory failure which impairs their ability to perform daily activities like bathing and toileting in the home.  A cane will not resolve  issue with performing activities of daily living. A wheelchair will allow patient to safely perform daily activities. Patient is not able to propel themselves in the home using a standard weight wheelchair due to endurance and general weakness. Patient can self propel in the lightweight wheelchair. Length of need Lifetime. Accessories: elevating leg rests (ELRs), wheel locks, extensions and anti-tippers.   04/01/22 1138   04/01/22 1137  For home use only DME Hospital bed  Once       Question Answer Comment  Length of Need Lifetime   Patient has (list medical condition): Respirtory failure, trach   The above medical condition requires: Patient requires the ability to reposition frequently   Head must be elevated greater than: 45 degrees   Bed type Heavy-duty, semi-electric (for patients >350 lbs.)   [employer] fast streamer Yes   Support Surface: Low Air loss Mattress      04/01/22 1138

## 2022-04-01 NOTE — Progress Notes (Signed)
PROGRESS NOTE    HPI was taken from Dr. Sidney Ace: Samuel Chavez is a 46 y.o. morbid obese African-American male with medical history significant for type 2 diabetes mellitus, hypertension, pancreatitis, ongoing tobacco abuse, systolic CHF, OSA, type 2 diabetes mellitus and diabetic neuropathy, lumbar radiculopathy, and morbid obesity, who presented to the ER with acute onset of worsening lower extremity edema with associated dyspnea as well as orthopnea.  He has been taking his torsemide without improvement.  He feels he gained over 30 pounds above his dry weight.  He is not on home O2.  He was expected to follow-up for evaluation of his obstructive sleep apnea.  During my interview the patient with fairly lethargic and was not responding to commands.  He was on BiPAP that was started in the ER especially after significant hypercarbia on VBG.  Therefore no further history could be obtained from the patient. ED Course: When he came to the ER BP was 158/141 with a heart rate of 91 and pulse symmetry was 91 and later 82% when he was placed on BiPAP at 60% FiO2 and it did improve to 98-100%.  Labs revealed VBG with PCO2 of 87 and HCO3 of 40.  CMP was remarkable for blood glucose of 319 and CO2 34 with chloride of 94 and calcium 8.8.  CBC was unremarkable. EKG as reviewed by me : EKG showed sinus rhythm with a rate of 98 with Q waves anteroseptally. Imaging: Two-view chest x-ray showed cardiomegaly and pulmonary vascular congestion without overt pulmonary edema.  The patient was given DuoNebs twice, 0.5 mg of IV Ativan, 1 inch of Nitropaste, and 60 mg of IV Lasix.  He remains somnolent and given significant hypercarbia decision was made to intubate him and placed on mechanical ventilation.  ICU team was notified.  He will be admitted to an ICU bed for further evaluation and management.   As per Dr. Jimmye Norman 9/6-9/12/23: Pt has been medically stable for d/c but no LTAC has taken the pt so far. Today, pt  decided he did not want to go to [facility] and his daughter can allegedly provided 24-7 care. Pt's daughter will have to come in and learn trach care & other care needed to take of pt. Also, CM is working DME needs & home health as well. Of note, pt's trach will be changed tomorrow via ENT, Dr. Virgia Land. Also, PT/OT today changed their recommendation to CIR. CIR admissions coordinator to review & follow updated notes to determine if pt is appropriate as per CM. Please see CM's notes for more information.    Samuel Chavez  OEH:[id number] DOB: 03-24-1976 DOA: 02/11/2022 PCP: [facility]  Assessment & Plan:   Principal Problem:   Acute respiratory failure with hypoxia and hypercarbia ([place]) Active Problems:   Acute on chronic systolic CHF (congestive heart failure) (HCC)   Hypertensive urgency   Type 2 diabetes mellitus with peripheral neuropathy (HCC)   Acute heart failure with preserved ejection fraction (HCC)   Renal failure (ARF), acute on chronic (HCC)   Hospital-acquired pneumonia   ARDS (adult respiratory distress syndrome) (HCC)   Pressure injury of skin   Constipation  Assessment and Plan: Acute on chronic hypoxemic and hypercapneic respiratory failure: s/p tracheostomy. Hx of OSA/OHS. Continue w/ trach collar & supplemental oxygen   MRSA HCAP: completed linezolid course.  Provetella sinusitis & has completed unasyn/augmentin. ID signed off  Muffled hearing: of left eye. Secondary to wax build up. S/p ear flushing but still w/  c/o muffled hearing. Continue w/ ear wax drops   AKI: resolved   Hypernatremia: WNL. Continue w/ free water flushes  Constipation: lactulose prn   Hyperkalemia: resolved  Hypomagnesemia: WNL today    DM2: likely poorly controlled. Continue on levemir, SSI w/ accuchecks. Tube feeds have been d/c and pt is eating by mouth. Per surgery, gastric tube sutures taken off & do NOT recommend removing the tube yet since the track hasnt matured yet.  Give  it another 2 weeks before considering removal as per gen surg    Normocytic anemia:  H&H are labile. 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 EEG 8/16 neg. Needs rehab, see PT/OT notes & CM's notes.  PICC associated DVT: PICC removed. Continue on eliquis   Depression: severity unknown. Continue on lexapro   Left leg pain: w/ hx of sciatica as per pt.  LLE [place] was neg for DVT. Continue on gabapentin, flexeril    Morbidly obese: BMI 57.8. Complicates overall care & prognosis      DVT prophylaxis: eliquis  Code Status: full  Family Communication:  family is at bedside but has no questions  Disposition Plan: PT/OT today changed their recommendation to CIR. CIR admissions coordinator to review & follow updated notes to determine if pt is appropriate as per CM. Please see CM's notes for more information. (See above)  Level of care: Med-Surg  Status is: Inpatient Remains inpatient appropriate because: PT/OT today changed their recommendation to CIR. CIR admissions coordinator to review & follow updated notes to determine if pt is appropriate as per CM. Please see CM's notes for more information. (See above)    Consultants:  ICU Gen surg  Nephro  ID  Cardio   Procedures:   Antimicrobials:    Subjective: Pt denies any complaints   Objective: Vitals:   03/31/22 1900 03/31/22 2156 04/01/22 0600 04/01/22 0724  BP:  (!) 147/90 (!) 152/89 138/80  Pulse:  91 93 87  Resp:  18 20 16   Temp:   98.2 F (36.8 C) 98 F (36.7 C)  TempSrc:   Oral Oral  SpO2: 98% 99% 97% 97%  Weight:      Height:        Intake/Output Summary (Last 24 hours) at 04/01/2022 0751 Last data filed at 04/01/2022 0700 Gross per 24 hour  Intake 500 ml  Output 5100 ml  Net -4600 ml   Filed Weights   03/24/22 1508 03/24/22 1714 03/28/22 0500  Weight: (!) 182.8 kg (!) 191 kg (!) 177.8 kg     Examination:  General exam: Appears comfortable. Morbidly obese Respiratory system: diminished breath sounds b/l. Trach in place Cardiovascular system: S1/S2+. No rubs or clicks  Gastrointestinal system: Abd is soft, NT, obese & hypoactive bowel sounds  Central nervous system: alert and oriented. Moves all extremities Psychiatry: judgement and insight appears normal. Flat mood and affect    Data Reviewed: I have personally reviewed following labs and imaging studies  CBC: Recent Labs  Lab 03/27/22 0346 03/28/22 0715 03/29/22 0441 03/30/22 0710 03/31/22 0540  WBC 8.1 7.1 7.0 7.3 8.2  HGB 8.3* 9.1* 8.5* 8.5* 8.7*  HCT 28.9* 32.1* 29.8* 29.6* 30.1*  MCV 94.8 93.9 94.0 93.7 95.0  PLT 269 216 253 265 675   Basic Metabolic Panel: Recent Labs  Lab 03/27/22 0346 03/28/22 0715 03/29/22 0441 03/30/22 0710 03/31/22 0540 04/01/22 0429  NA 137 138 139 142 138  --  K 4.4 4.3 4.1 4.2 4.0  --   CL 98 98 98 100 98  --   CO2 32 32 33* 35* 34*  --   GLUCOSE 154* 121* 117* 103* 149*  --   BUN 13 13 10 7 11   --   CREATININE 0.63 0.60* 0.59* 0.56* 0.65  --   CALCIUM 8.3* 8.6* 8.5* 8.5* 8.5*  --   MG 1.3* 1.5* 1.7 1.6* 1.7 1.7   GFR: Estimated Creatinine Clearance: 189.2 mL/min (by C-G formula based on SCr of 0.65 mg/dL). Liver Function Tests: No results for input(s): "AST", "ALT", "ALKPHOS", "BILITOT", "PROT", "ALBUMIN" in the last 168 hours.  No results for input(s): "LIPASE", "AMYLASE" in the last 168 hours. No results for input(s): "AMMONIA" in the last 168 hours. Coagulation Profile: No results for input(s): "INR", "PROTIME" in the last 168 hours. Cardiac Enzymes: No results for input(s): "CKTOTAL", "CKMB", "CKMBINDEX", "TROPONINI" in the last 168 hours. BNP (last 3 results) No results for input(s): "PROBNP" in the last 8760 hours. HbA1C: No results for input(s): "HGBA1C" in the last 72 hours. CBG: Recent Labs  Lab 03/30/22 1613 03/30/22 2052 03/31/22 0908  03/31/22 1610 03/31/22 2050  GLUCAP 115* 150* 144* 153* 122*   Lipid Profile: No results for input(s): "CHOL", "HDL", "LDLCALC", "TRIG", "CHOLHDL", "LDLDIRECT" in the last 72 hours. Thyroid Function Tests: No results for input(s): "TSH", "T4TOTAL", "FREET4", "T3FREE", "THYROIDAB" in the last 72 hours. Anemia Panel: No results for input(s): "VITAMINB12", "FOLATE", "FERRITIN", "TIBC", "IRON", "RETICCTPCT" in the last 72 hours. Sepsis Labs: No results for input(s): "PROCALCITON", "LATICACIDVEN" in the last 168 hours.  No results found for this or any previous visit (from the past 240 hour(s)).       Radiology Studies: No results found.      Scheduled Meds:  apixaban  5 mg Oral BID   carbamide peroxide  5 drop Left EAR BID   carvedilol  6.25 mg Oral BID WC   Chlorhexidine Gluconate Cloth  6 each Topical Q0600   docusate sodium  100 mg Oral BID   escitalopram  10 mg Oral Daily   feeding supplement  237 mL Oral TID BM   free water  30 mL Per Tube BID   gabapentin  600 mg Oral BID   insulin aspart  0-20 Units Subcutaneous TID AC   insulin detemir  22 Units Subcutaneous BID   lidocaine  1 patch Transdermal Q24H   multivitamin with minerals  1 tablet Oral Daily   mupirocin ointment   Nasal BID   nutrition supplement (JUVEN)  1 packet Oral BID BM   pantoprazole  40 mg Oral Daily   polyethylene glycol  17 g Oral Daily   Continuous Infusions:  sodium chloride 10 mL/hr at 03/14/22 1036     LOS: 49 days    Time spent: 25 mins     Wyvonnia Dusky, MD [facility] Pager [phone]  If 7PM-7AM, please contact night-coverage [email or web address] 04/01/2022, 7:51 AM

## 2022-04-01 NOTE — TOC Progression Note (Signed)
Transition of Care [facility]) - Progression Note    Patient Details  Name: Samuel Chavez MRN: [id number] Date of Birth: [birth year]  [facility]) CM/SW Contact  Beverly Sessions, RN Phone Number: 04/01/2022, 10:02 AM  Clinical Narrative:     Per Delsa Sale with [facility] will not be offering bed   Expected Discharge Plan:  (TBD) Barriers to Discharge: Continued Medical Work up  Expected Discharge Plan and Services Expected Discharge Plan:  (TBD)   Discharge Planning Services: CM Consult   Living arrangements for the past 2 months: Single Family Home                                       Social Determinants of Health (SDOH) Interventions    Readmission Risk Interventions     No data to display

## 2022-04-01 NOTE — Progress Notes (Signed)
Patient is not able to walk the distance required to go the bathroom, or he/she is unable to safely negotiate stairs required to access the bathroom.  A 3in1 BSC will alleviate this problem  

## 2022-04-01 NOTE — Plan of Care (Signed)
?  Problem: Activity: ?Goal: Capacity to carry out activities will improve ?Outcome: Progressing ?  ?

## 2022-04-01 NOTE — Progress Notes (Signed)
Pt refused  free water 30  ml flush per tube but was educated about its importance. Will continue to monitor.

## 2022-04-02 DIAGNOSIS — I5023 Acute on chronic systolic (congestive) heart failure: Secondary | ICD-10-CM | POA: Diagnosis not present

## 2022-04-02 DIAGNOSIS — I160 Hypertensive urgency: Secondary | ICD-10-CM | POA: Diagnosis not present

## 2022-04-02 DIAGNOSIS — E1142 Type 2 diabetes mellitus with diabetic polyneuropathy: Secondary | ICD-10-CM | POA: Diagnosis not present

## 2022-04-02 DIAGNOSIS — J9601 Acute respiratory failure with hypoxia: Secondary | ICD-10-CM | POA: Diagnosis not present

## 2022-04-02 LAB — BLOOD GAS, ARTERIAL
Acid-base deficit: 1.3 mmol/L (ref 0.0–2.0)
Bicarbonate: 24.8 mmol/L (ref 20.0–28.0)
Delivery systems: 0.5
FIO2: 0.5 %
MECHVT: 560 mL
PEEP: 16 cmH2O
Patient temperature: 37
RATE: 30 resp/min
pCO2 arterial: 46 mmHg (ref 32–48)
pH, Arterial: 7.34 — ABNORMAL LOW (ref 7.35–7.45)
pO2, Arterial: 84 mmHg (ref 83–108)

## 2022-04-02 LAB — CBC
HCT: 29.8 % — ABNORMAL LOW (ref 39.0–52.0)
Hemoglobin: 8.6 g/dL — ABNORMAL LOW (ref 13.0–17.0)
MCH: 27.3 pg (ref 26.0–34.0)
MCHC: 28.9 g/dL — ABNORMAL LOW (ref 30.0–36.0)
MCV: 94.6 fL (ref 80.0–100.0)
Platelets: 262 10*3/uL (ref 150–400)
RBC: 3.15 MIL/uL — ABNORMAL LOW (ref 4.22–5.81)
RDW: 17.1 % — ABNORMAL HIGH (ref 11.5–15.5)
WBC: 9 10*3/uL (ref 4.0–10.5)
nRBC: 0 % (ref 0.0–0.2)

## 2022-04-02 LAB — BASIC METABOLIC PANEL
Anion gap: 5 (ref 5–15)
BUN: 12 mg/dL (ref 6–20)
CO2: 33 mmol/L — ABNORMAL HIGH (ref 22–32)
Calcium: 8.4 mg/dL — ABNORMAL LOW (ref 8.9–10.3)
Chloride: 97 mmol/L — ABNORMAL LOW (ref 98–111)
Creatinine, Ser: 0.65 mg/dL (ref 0.61–1.24)
GFR, Estimated: >60 mL/min (ref >60)
Glucose, Bld: 117 mg/dL — ABNORMAL HIGH (ref 70–99)
Potassium: 4.3 mmol/L (ref 3.5–5.1)
Sodium: 135 mmol/L (ref 135–145)

## 2022-04-02 LAB — GLUCOSE, CAPILLARY
Glucose-Capillary: 142 mg/dL — ABNORMAL HIGH (ref 70–99)
Glucose-Capillary: 144 mg/dL — ABNORMAL HIGH (ref 70–99)
Glucose-Capillary: 116 mg/dL — ABNORMAL HIGH (ref 70–99)
Glucose-Capillary: 174 mg/dL — ABNORMAL HIGH (ref 70–99)

## 2022-04-02 LAB — MAGNESIUM: Magnesium: 1.6 mg/dL — ABNORMAL LOW (ref 1.7–2.4)

## 2022-04-02 MED ORDER — ENSURE MAX PROTEIN PO LIQD
11.0000 [oz_av] | Freq: Three times a day (TID) | ORAL | Status: DC
  Administered 2022-04-02 – 2022-04-06 (×4): 11 [oz_av] via ORAL
  Filled 2022-04-02: qty 330

## 2022-04-02 MED ORDER — VITAMIN D 25 MCG (1000 UNIT) PO TABS
2000.0000 [IU] | ORAL_TABLET | Freq: Every day | ORAL | Status: DC
  Administered 2022-04-03 – 2022-04-07 (×5): 2000 [IU] via ORAL
  Filled 2022-04-02 (×5): qty 2

## 2022-04-02 NOTE — TOC Progression Note (Signed)
Transition of Care [facility]) - Progression Note    Patient Details  Name: Samuel Chavez MRN: [id number] Date of Birth: [birth year]  [facility]) CM/SW Contact  Beverly Sessions, RN Phone Number: 04/02/2022, 8:43 AM  Clinical Narrative:     Plan to trach to be downsized today and changed to cuffless today.   Will send trach orders over to [facility] with adapt after that is complete  CIR admissions coordinator to place consult after OT sees patient   Expected Discharge Plan:  (TBD) Barriers to Discharge: Continued Medical Work up  Expected Discharge Plan and Services Expected Discharge Plan:  (TBD)   Discharge Planning Services: CM Consult   Living arrangements for the past 2 months: Single Family Home                                       Social Determinants of Health (SDOH) Interventions    Readmission Risk Interventions     No data to display

## 2022-04-02 NOTE — Progress Notes (Signed)
  Inpatient Rehabilitation Admissions Coordinator   Met with patient and son, Erlene Quan, at bedside for rehab assessment. We discussed goals and expectations of a possible CIR admit. They prefer CIR for rehab. Family can provide expected caregiver support that is recommended.. I will begin insurance Auth with [place] medicaid healthy Blue once updated PT notes are available from today. He states he has worked with therapy 3 times per day.He continues to discuss his wish that trach be removed prior to discharge home as well as removal of his contact precautions.  Please call me with any questions.   Danne Baxter, RN, MSN Rehab Admissions Coordinator [phone]

## 2022-04-02 NOTE — Progress Notes (Signed)
Inpatient Rehabilitation Admissions Coordinator   Rehab consult received. I will see patient this afternoon for bedside assessment for possible Cir admit.  Danne Baxter, RN, MSN Rehab Admissions Coordinator [phone] 04/02/2022 9:42 AM

## 2022-04-02 NOTE — Progress Notes (Signed)
PROGRESS NOTE    46 y.o. 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  Patient states he has been taking his medication without improvement and reports 30 pound weight gain.  Patient found to be significantly hypoxic and hypercarbic and placed on BiPAP.  Following admission, he decompensated and was intubated.   Pt has been medically stable for d/c but no LTAC has taken the pt so far. Today, pt decided he did not want to go to [facility] and his daughter can allegedly provided 24-7 care. Pt's daughter will have to come in and learn trach care & other care needed to take of pt. Also, CM is working DME needs & home health as well. Of note, pt's trach will be changed tomorrow via ENT, Dr. Virgia Land. Also, PT/OT today changed their recommendation to CIR and he is pending evaluation.    Samuel Chavez  [name] DOB: [birth year] DOA: 02/11/2022 PCP: [facility]  Assessment & Plan:   Principal Problem:   Acute respiratory failure with hypoxia and hypercarbia ([place]) Active Problems:   Acute on chronic systolic CHF (congestive heart failure) (HCC)   Hypertensive urgency   Type 2 diabetes mellitus with peripheral neuropathy (HCC)   Acute heart failure with preserved ejection fraction (HCC)   Renal failure (ARF), acute on chronic (HCC)   Hospital-acquired pneumonia   ARDS (adult respiratory distress syndrome) (HCC)   Pressure injury of skin   Constipation  Assessment and Plan: Acute on chronic hypoxemic and hypercapneic respiratory failure: s/p tracheostomy. Hx of OSA/OHS. Continue w/ trach collar & supplemental oxygen.  Seen by general surgery and underwent fiberoptic tracheoscopy to confirm  proper placement of tube.   MRSA HCAP: completed linezolid course.  Provetella sinusitis & has completed unasyn/augmentin. ID signed off  Muffled hearing: of left eye. Secondary to wax build up. S/p ear flushing but still w/ c/o muffled hearing. Continue w/ ear wax drops   AKI: resolved   Hypernatremia: WNL. Continue w/ free water flushes  Constipation: lactulose prn   Hyperkalemia: resolved  Hypomagnesemia: WNL today    DM2: likely poorly controlled. Continue on levemir, SSI w/ accuchecks. Tube feeds have been d/c and pt is eating by mouth. Per surgery, gastric tube sutures taken off & do NOT recommend removing the tube yet since the track hasnt matured yet.  Give it another 2 weeks before considering removal as per gen surg    Normocytic anemia:  H&H are labile. 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 EEG 8/16 neg. Needs rehab, see PT/OT notes & CM's notes.  PICC associated DVT: PICC removed. Continue on eliquis   Depression: severity unknown. Continue on lexapro   Left leg pain: w/ hx of sciatica as per pt.  LLE [place] was neg for DVT. Continue on gabapentin, flexeril    Morbidly obese: BMI 57.8. Complicates overall care & prognosis      DVT prophylaxis: eliquis  Code Status: full  Family Communication: Left message for family Disposition Plan: Possible CIR  Level of care: Med-Surg  Status is: Inpatient Remains inpatient appropriate because: Disposition determination   Consultants:  ICU Gen surg  Nephro  ID  Cardio   Procedures:   Antimicrobials:  Completed antibiotic  course  Subjective: Patient currently [place] any chest pain or shortness of breath  Objective: Vitals:   04/02/22 0847 04/02/22 1021 04/02/22 1538 04/02/22 1540  BP: 139/83     Pulse: 94 90 82   Resp: 18 20 20    Temp: 98.6 F (37 C)     TempSrc: Oral     SpO2: 100% 100% 94% 99%  Weight:       Height:        Intake/Output Summary (Last 24 hours) at 04/02/2022 1600 Last data filed at 04/02/2022 0959 Gross per 24 hour  Intake 590 ml  Output 4400 ml  Net -3810 ml    Filed Weights   03/24/22 1508 03/24/22 1714 03/28/22 0500  Weight: (!) 182.8 kg (!) 191 kg (!) 177.8 kg    Examination:  General exam: Alert and oriented x3, no acute distress Respiratory system: diminished breath sounds b/l secondary to body habitus. Trach in place Cardiovascular system: Regular rate and rhythm, S1-S2 Gastrointestinal system: Soft, nontender, obese, positive bowel sounds Psychiatry: Great, no evidence of psychoses    Data Reviewed: Hemoglobin with mild drop from previous day, currently at 8.6.  Labs otherwise stable.  CBC: Recent Labs  Lab 03/29/22 0441 03/30/22 0710 03/31/22 0540 04/01/22 0848 04/02/22 0434  WBC 7.0 7.3 8.2 8.2 9.0  HGB 8.5* 8.5* 8.7* 9.2* 8.6*  HCT 29.8* 29.6* 30.1* 32.4* 29.8*  MCV 94.0 93.7 95.0 93.9 94.6  PLT 253 265 253 264 440    Basic Metabolic Panel: Recent Labs  Lab 03/29/22 0441 03/30/22 0710 03/31/22 0540 04/01/22 0429 04/01/22 0848 04/02/22 0434  NA 139 142 138  --  138 135  K 4.1 4.2 4.0  --  4.5 4.3  CL 98 100 98  --  97* 97*  CO2 33* 35* 34*  --  33* 33*  GLUCOSE 117* 103* 149*  --  126* 117*  BUN 10 7 11   --  9 12  CREATININE 0.59* 0.56* 0.65  --  0.58* 0.65  CALCIUM 8.5* 8.5* 8.5*  --  8.7* 8.4*  MG 1.7 1.6* 1.7 1.7  --  1.6*    GFR: Estimated Creatinine Clearance: 189.2 mL/min (by C-G formula based on SCr of 0.65 mg/dL). Liver Function Tests: No results for input(s): "AST", "ALT", "ALKPHOS", "BILITOT", "PROT", "ALBUMIN" in the last 168 hours.  No results for input(s): "LIPASE", "AMYLASE" in the last 168 hours. No results for input(s): "AMMONIA" in the last 168 hours. Coagulation Profile: No results for input(s): "INR", "PROTIME" in the last 168 hours. Cardiac Enzymes: No results for input(s): "CKTOTAL", "CKMB",  "CKMBINDEX", "TROPONINI" in the last 168 hours. BNP (last 3 results) No results for input(s): "PROBNP" in the last 8760 hours. HbA1C: No results for input(s): "HGBA1C" in the last 72 hours. CBG: Recent Labs  Lab 04/01/22 1148 04/01/22 1616 04/01/22 2125 04/02/22 0838 04/02/22 1155  GLUCAP 148* 153* 123* 142* 144*    Lipid Profile: No results for input(s): "CHOL", "HDL", "LDLCALC", "TRIG", "CHOLHDL", "LDLDIRECT" in the last 72 hours. Thyroid Function Tests: No results for input(s): "TSH", "T4TOTAL", "FREET4", "T3FREE", "THYROIDAB" in the last 72 hours. Anemia Panel: No results for input(s): "VITAMINB12", "FOLATE", "FERRITIN", "TIBC", "IRON", "RETICCTPCT" in the last 72 hours. Sepsis Labs: No results for input(s): "PROCALCITON", "LATICACIDVEN" in the last 168 hours.  No results found for this or any previous visit (from the past 240 hour(s)).       Radiology Studies: No results found.      Scheduled Meds:  apixaban  5 mg Oral BID   carbamide peroxide  5 drop Left EAR BID   carvedilol  6.25 mg Oral BID WC   Chlorhexidine Gluconate Cloth  6 each Topical Q0600   docusate sodium  100 mg Oral BID   escitalopram  10 mg Oral Daily   free water  30 mL Per Tube BID   gabapentin  600 mg Oral BID   insulin aspart  0-20 Units Subcutaneous TID AC   insulin detemir  22 Units Subcutaneous BID   lidocaine  1 patch Transdermal Q24H   multivitamin with minerals  1 tablet Oral Daily   mupirocin ointment   Nasal BID   nutrition supplement (JUVEN)  1 packet Oral BID BM   pantoprazole  40 mg Oral Daily   polyethylene glycol  17 g Oral Daily   Ensure Max Protein  11 oz Oral TID   Continuous Infusions:  sodium chloride 10 mL/hr at 03/14/22 1036     LOS: 50 days     Annita Brod, MD [facility] Pager [phone]  If 7PM-7AM, please contact night-coverage [email or web address] 04/02/2022, 4:00 PM

## 2022-04-02 NOTE — Progress Notes (Signed)
End of shift note:  Pt remained on RA  with tracheostomy collar off this afternoon and SpO2 at rest  ranged from 93%-95%. BG was checked and coverage was given.

## 2022-04-02 NOTE — TOC Progression Note (Addendum)
Transition of Care [facility]) - Progression Note    Patient Details  Name: KEMAR PANDIT MRN: [id number] Date of Birth: February 12, 1976  [facility]) CM/SW Contact  Beverly Sessions, RN Phone Number: 04/02/2022, 2:36 PM  Clinical Narrative:    Lurline Idol changed to proximal tube for a#6 cuffless XLT proximal tube  CIR to assess patient today Updated patient and he is aware they will be coming to see him today  Zack with adapt checking to see if patient will qualify for low air loss mattress   Expected Discharge Plan:  (TBD) Barriers to Discharge: Continued Medical Work up  Expected Discharge Plan and Services Expected Discharge Plan:  (TBD)   Discharge Planning Services: CM Consult   Living arrangements for the past 2 months: Single Family Home                                       Social Determinants of Health (SDOH) Interventions    Readmission Risk Interventions     No data to display

## 2022-04-02 NOTE — Progress Notes (Signed)
Inpatient Rehab Admissions Coordinator:  ? ?Per PT recommendation,  patient was screened for CIR candidacy by Breiana Stratmann, MS, CCC-SLP. At this time, Pt. Appears to be a a potential candidate for CIR. I will place order for rehab consult per protocol for full assessment. Please contact me any with questions. ? ?Zenita Kister, MS, CCC-SLP ?Rehab Admissions Coordinator  ?[phone] (celll) ?[phone] (office) ? ?

## 2022-04-02 NOTE — Progress Notes (Addendum)
Nutrition Follow Up Note   DOCUMENTATION CODES:   Morbid obesity  INTERVENTION:   Ensure Max protein supplement TID, each supplement provides 150kcal and 30g of protein.   Free water flushes 37ml BID via tube   Cholecalciferol 2000 units po daily   Juven Fruit Punch BID, each serving provides 95kcal and 2.5g of protein (amino acids glutamine and arginine)  Double protein with meal trays   NUTRITION DIAGNOSIS:   Inadequate oral intake related to inability to eat (pt sedated and ventilated) as evidenced by NPO status. -resolved   GOAL:   Patient will meet greater than or equal to 90% of their needs -progressing   MONITOR:   PO intake, Supplement acceptance, Labs, Weight trends, Skin, I & O's  ASSESSMENT:   46 y/o male with h/o COPD, CHF, HTN, DM and OSA who is admitted with CHF exacerbation, CAP and severe ARDS.  -Pt s/p tracheostomy and NGT placement 8/8 (now removed) -Pt s/p new HD 8/12 (now discontinued)  -Pt s/p surgically placed 16 French gastric tube 8/25  Met with pt in room today. Pt reports continued good appetite and oral intake; pt eating 100% of meals. Pt reports that he is drinking some Ensure but reports that he is not drinking it every day. Pt reports that he will only drink the chocolate flavor. RD encouraged pt to try and drink at least 2 Ensure per day. Pt with questions regarding his blood sugars. Pt reports that he drank ~20 apple juices today. RD provided patient with diabetes diet education today and recommended alternate drink substitutes. Will request for crystal light packets and bottled waters to be placed on meal trays. Pt does not wish to be placed on a carbohydrate modified diet. RD will leave pt on a regular diet for now as his blood sugars are in acceptable range and pt has been through a lot and needs to have something he enjoys. Did recommend for patient to try and limit sugary drinks and foods with empty calories. RD reminded patient that  increased blood sugars will effect his healing and recovery. Per chart, pt remains up ~16lbs from his UBW. Plan is for possible CIR.   Of note, pt with vitamin D deficiency; will add supplementation   Medications reviewed and include: colace, insulin, MVI, juven, protonix, miralax  Labs reviewed: K 4.3 wnl, Mg 1.6(L) Vitamin D 20.35(L)- 8/17 Hgb 8.6(L), Hct 29.8(L) Cbgs- 144, 142 x 24 hrs  Diet Order:   Diet Order             Diet regular Room service appropriate? Yes with Assist; Fluid consistency: Thin  Diet effective now                  EDUCATION NEEDS:   Education needs have been addressed  Skin:    Stage III: buttocks Incisions: neck s/p trach  Last BM:  9/11  Height:   Ht Readings from Last 1 Encounters:  02/28/22 $RemoveB'5\' 9"'umPWzHPO$  (1.753 m)    Weight:   Wt Readings from Last 1 Encounters:  03/28/22 (!) 177.8 kg    Ideal Body Weight:  72.7 kg  BMI:  Body mass index is 57.89 kg/m.  Estimated Nutritional Needs:   Kcal:  2700-3000kcal/day  Protein:  >150g/day  Fluid:  2.0L/day  Koleen Distance MS, RD, LDN Please refer to [facility] for RD and/or RD on-call/weekend/after hours pager

## 2022-04-02 NOTE — Progress Notes (Signed)
Physical Therapy Treatment Patient Details Name: Samuel Chavez MRN: [id number] DOB: Jan 12, 1976 Today's Date: 04/02/2022   History of Present Illness Pt is 43 YOM admitted for PNA, renail failure, AKI, hyperkalemia, CHF, Resp failure, sepsis, and is s/p prolonged ventilation, trach placement. PMH includes: DM, HTN, sCHF, OSA, obesity, ARDS Resp failure, sepsis.    PT Comments    Pt was sitting in recliner (has been x ~ 2.5 hours) and requesting to return to bed. Performed several exercises in recliner prior to hoyer transfer back to EOB. Once sitting EOB performed STS 1 x. Pt much more fatigued than previously observed on Saturday. He was however able to stand with less assistance and with much improved posture. Tolerated standing ~ 25-30 sec. He will greatly benefit from CIR level of care at DC to maximize independence while assisting pt towards PLOF. Pt is motivated and has supportive family willing to assist at DC.   Recommendations for follow up therapy are one component of a multi-disciplinary discharge planning process, led by the attending physician.  Recommendations may be updated based on patient status, additional functional criteria and insurance authorization.  Follow Up Recommendations  Acute inpatient rehab (3hours/day) Can patient physically be transported by private vehicle: No   Assistance Recommended at Discharge Frequent or constant Supervision/Assistance  Patient can return home with the following Two people to help with walking and/or transfers;Two people to help with bathing/dressing/bathroom;Direct supervision/assist for medications management;Help with stairs or ramp for entrance;Assist for transportation;Assistance with feeding;Assistance with cooking/housework;Direct supervision/assist for financial management   Equipment Recommendations   (defer to next level of care)       Precautions / Restrictions Precautions Precautions: Fall Precaution Comments: trach with  PMV Restrictions Weight Bearing Restrictions: No     Mobility  Bed Mobility Overal bed mobility: Needs Assistance Bed Mobility: Sit to Supine Rolling: Min guard  Sit to supine: Max assist General bed mobility comments: Pt was hoyer transfered to EOB short sitting. He required max assist of one to return to supine. Assistance with BLEs to progress back into bed.    Transfers Overall transfer level: Needs assistance Equipment used: Rolling walker (2 wheels) Transfers: Sit to/from Stand Sit to Stand: Mod assist, +2 physical assistance    General transfer comment: Pt was able to transfer and tolerate sitting in recliner x ~ 2.5 hours. He did stand 1 x EOB with bed height slightly elevated for ~ 25-30 sec. More upright standing posture than author observed last time treating pt ~ 4 day prior. Strength does seem to be getting stronger and pt continues to be motivated to improve independence . Transfer via Geophysicist/field seismologist:  Product manager)  Ambulation/Gait  General Gait Details: Currently unable   Balance Overall balance assessment: Needs assistance Sitting-balance support: No upper extremity supported, Feet supported Sitting balance-Leahy Scale: Fair     Standing balance support: Bilateral upper extremity supported Standing balance-Leahy Scale: Poor     Cognition Arousal/Alertness: Awake/alert Behavior During Therapy: WFL for tasks assessed/performed Overall Cognitive Status: Within Functional Limits for tasks assessed    General Comments: Pt was in recliner. requesting to get abck into bed due to LBP. hoyer transfer back to bed prior to STS transfer EOB        Exercises Total Joint Exercises Ankle Circles/Pumps: AROM, Both, 10 reps Quad Sets: AROM, 10 reps Heel Slides: AAROM, Both, 10 reps Long Arc Quad: AAROM, Left, 10 reps    General Comments General comments (skin integrity, edema, etc.): Vital stable throughout. Pt just  had trach and PMV donned. No trach collar on pt  throughout session. pt performed several exercises in recliner prior to transfer back to EOB via hoyer      Pertinent Vitals/Pain Pain Assessment Pain Assessment: 0-10 Pain Score: 4  Pain Location: back and L knee Pain Descriptors / Indicators: Aching, Discomfort Pain Intervention(s): Limited activity within patient's tolerance, Monitored during session, Premedicated before session, Repositioned     PT Goals (current goals can now be found in the care plan section) Acute Rehab PT Goals Patient Stated Goal: get better so I can get home Progress towards PT goals: Progressing toward goals    Frequency    Min 2X/week      PT Plan Current plan remains appropriate    Co-evaluation     PT goals addressed during session: Mobility/safety with mobility;Balance;Proper use of DME;Strengthening/ROM        AM-PAC PT "6 Clicks" Mobility   Outcome Measure  Help needed turning from your back to your side while in a flat bed without using bedrails?: A Little Help needed moving from lying on your back to sitting on the side of a flat bed without using bedrails?: A Lot Help needed moving to and from a bed to a chair (including a wheelchair)?: Total Help needed standing up from a chair using your arms (e.g., wheelchair or bedside chair)?: Total Help needed to walk in hospital room?: Total Help needed climbing 3-5 steps with a railing? : Total 6 Click Score: 9    End of Session Equipment Utilized During Treatment: Oxygen Activity Tolerance: Patient tolerated treatment well Patient left: in bed;with call bell/phone within reach;with bed alarm set Nurse Communication: Mobility status PT Visit Diagnosis: Muscle weakness (generalized) (M62.81);Other abnormalities of gait and mobility (R26.89)     Time: 4827-0786 PT Time Calculation (min) (ACUTE ONLY): 23 min  Charges:  $Therapeutic Exercise: 8-22 mins $Therapeutic Activity: 8-22 mins                    Julaine Fusi  PTA 04/02/22, 4:33 PM

## 2022-04-02 NOTE — Progress Notes (Signed)
Occupational Therapy Treatment Patient Details Name: Samuel Chavez MRN: [id number] DOB: 12-Apr-1976 Today's Date: 04/02/2022   History of present illness Pt is 60 YOM admitted for PNA, renail failure, AKI, hyperkalemia, CHF, Resp failure, sepsis, and is s/p prolonged ventilation, trach placement. PMH includes: DM, HTN, sCHF, OSA, obesity, ARDS Resp failure, sepsis.   OT comments  Mr Sear was seen for OT treatment on this date. Upon arrival to room pt reclined in bed, reports not sleeping well last night however agreeable to tx. Pt requires MOD A x2 exit bed. Tolerates ~20 min sitting with SUPERVISION and intermittent BUE support - MD in to perform trach change while pt in sitting. MAX A don B socks in sitting. MOD A x2 + RW standing trials, completes 2 attempts with poor tolerance. Pt reports he needs to go to sleep earlier tonight so he can participate more in therapy. Mobility specialists assisted with hoyer bed>chair. Pt making good progress toward goals, will continue to follow POC. Discharge recommendation updated to AIR to reflect pt progress.      Recommendations for follow up therapy are one component of a multi-disciplinary discharge planning process, led by the attending physician.  Recommendations may be updated based on patient status, additional functional criteria and insurance authorization.    Follow Up Recommendations  Acute inpatient rehab (3hours/day)    Assistance Recommended at Discharge Frequent or constant Supervision/Assistance  Patient can return home with the following  Two people to help with walking and/or transfers;Two people to help with bathing/dressing/bathroom   Equipment Recommendations  Other (comment)    Recommendations for Other Services      Precautions / Restrictions Precautions Precautions: Fall Restrictions Weight Bearing Restrictions: No       Mobility Bed Mobility Overal bed mobility: Needs Assistance       Supine to sit: Mod  assist, +2 for physical assistance          Transfers Overall transfer level: Needs assistance Equipment used: Rolling walker (2 wheels) Transfers: Sit to/from Stand Sit to Stand: Mod assist, +2 physical assistance             Transfer via Lift Equipment:  (hoyer lift)   Balance Overall balance assessment: Needs assistance Sitting-balance support: No upper extremity supported, Feet supported Sitting balance-Leahy Scale: Fair Sitting balance - Comments: sat EOB ~20 min during session with supervision only. Feet and intermittent BUE support required.   Standing balance support: Bilateral upper extremity supported Standing balance-Leahy Scale: Poor                             ADL either performed or assessed with clinical judgement   ADL Overall ADL's : Needs assistance/impaired                                       General ADL Comments: MAX A don B socks in sitting. SUPERVISION seated grooming tasks.      Cognition Arousal/Alertness: Awake/alert Behavior During Therapy: WFL for tasks assessed/performed Overall Cognitive Status: Within Functional Limits for tasks assessed                                                General Comments SpO2 95% on RA  Pertinent Vitals/ Pain       Pain Assessment Pain Assessment: Faces Faces Pain Scale: Hurts a little bit Pain Location: back Pain Descriptors / Indicators: Sore Pain Intervention(s): Repositioned   Frequency  Min 2X/week        Progress Toward Goals  OT Goals(current goals can now be found in the care plan section)  Progress towards OT goals: Progressing toward goals  Acute Rehab OT Goals Patient Stated Goal: to walk OT Goal Formulation: With patient/family Time For Goal Achievement: 04/10/22 Potential to Achieve Goals: Fair ADL Goals Pt Will Perform Grooming: with min assist;bed level Pt Will Transfer to Toilet: with max assist;with +2  assist Pt/caregiver will Perform Home Exercise Program: Increased ROM;Increased strength;Right Upper extremity;Left upper extremity;With minimal assist  Plan Discharge plan needs to be updated;Frequency remains appropriate    Co-evaluation                 AM-PAC OT "6 Clicks" Daily Activity     Outcome Measure   Help from another person eating meals?: A Little Help from another person taking care of personal grooming?: A Little Help from another person toileting, which includes using toliet, bedpan, or urinal?: A Lot Help from another person bathing (including washing, rinsing, drying)?: A Lot Help from another person to put on and taking off regular upper body clothing?: A Little Help from another person to put on and taking off regular lower body clothing?: A Lot 6 Click Score: 15    End of Session    OT Visit Diagnosis: Muscle weakness (generalized) (M62.81);Other abnormalities of gait and mobility (R26.89)   Activity Tolerance Patient tolerated treatment well   Patient Left in chair;with call bell/phone within reach;with chair alarm set;with family/visitor present   Nurse Communication          Time: 9983-3825 OT Time Calculation (min): 39 min  Charges: OT General Charges $OT Visit: 1 Visit OT Treatments $Self Care/Home Management : 8-22 mins $Therapeutic Activity: 8-22 mins  Dessie Coma, M.S. OTR/L  04/02/22, 12:48 PM  ascom [phone]

## 2022-04-02 NOTE — Plan of Care (Signed)
  Problem: Clinical Measurements: Goal: Will remain free from infection Outcome: Progressing   Problem: Clinical Measurements: Goal: Respiratory complications will improve Outcome: Progressing   Problem: Clinical Measurements: Goal: Cardiovascular complication will be avoided Outcome: Progressing   Problem: Safety: Goal: Ability to remain free from injury will improve Outcome: Progressing

## 2022-04-02 NOTE — Progress Notes (Signed)
Speech Language Pathology Treatment: Dysphagia  Patient Details Name: DRAGO HAMMONDS MRN: [id number] DOB: 12-25-1975 Today's Date: 04/02/2022 Time: 7829-5621 SLP Time Calculation (min) (ACUTE ONLY): 35 min  Assessment / Plan / Recommendation Clinical Impression  Pt seen for PMV tx/education session today; PMV wear/use/care as well as assessment of pt's vocal quality and breath support for verbal communication w/ others while wearing the PMV. Also ongoing education of use of the Incentive Spirometer for breath support activity/exs.   Pt initially had reduced Stamina and breath support at the phrase level when wearing the PMV. Pt has been wearing the PMV during waking hours, and w/ other therapies. He is still awaiting LTACH placement consideration, per MD note. Pt had been wearing the PMV while sleeping per chart notes; this, and the risks, were discussed w/ pt and pt agreed to not wear it while sleeping.    Pt is alert, more verbal and engaging w/ others and w/ this therapist during session today. He is no longer using gestures(head nodding) but instead is using verbal communication during his engagement w/ others.   Per request of ENT, pt's trach was downsized this morning to a Shiley #6, XLT, cuffless. Pt was wearing the PMV on RA upon entering room(no TC O2 support).    Explained the use and wear of the PMV again to pt; trach and stoma area inspected; HR 80s, O2 sats upper 90s%; RR ~18-20. PMV in place; HR/RR/O2 sats remained at/close to his baseline throughout the session.  Pt verbally communicated w/ this therapist during conversations re: his Son and TV show he was watching. Also discussed the changing of the trach this morning, ENT's conversation w/ him re: need for the trach -- pt verbalized at sentence level his frustration re: being told he might keep the trach from now on". He stated this made him "depressed"; NSG updated.  Pt also verbalized his frustration about having to drink  sugar-free sodas. Discussed his recent blood sugar levels (NSG present); he asked what his levels needed to stay at(appropriate insight/question) but was reluctant to participate in verbal problem-solving to keep the level at an appropriate level, such as drinking sugar-free drinks; "I don't understand why I can't drink them since I'm getting insulin"; "I hate the taste of the diet ones".  Asked if the Dietician could come by to discuss his dietary needs w/ him, and he agreed.  Assisted pt in ordering his Lunch meal - he verbally engaged w/ Dining services via TC asking for certain foods. Verbalizations were c/b adequate volume and breath support for speech/responses. He conversed at the sentence level in conversation. Intelligibility was adequate w/ much improved breath support now -- some mumbled speech but suspect baseline. Vocal quality appropriate.  Pt denied any SOB or difficulty making himself understood/heard by others in room or on the phone. Encouraged him to continue to Sit Upright more for conversations to best support his breathing/talking, vs lying on his back (heaviness of gravity on him). Pt stated No discomfort in his breathing or wear of the PMV during the day. No increased effort noted in respirations during conversation this session; no overt use of accessary muscles or distress was noted in his breathing pattern.  Education provided on PMV care; pt practiced donning/doffing the PMV stating he doesn't put it on "too tight" so that he can get it off. Noted pt's Hands were min stiff and he needed slight+ support w/ manipulation of the PMV -- NSG staff to support when needed.  Practiced using the Incentive Spirometer to continue to improve breath support for communication -- he was able to achieve adequate inhalation at 750 ml for reps of 3 x5 sets. He exhibited fatigue w/ task. Encouraged use daily at least 10x relating this exercise and improved breath support to his goal of getting the  trach "out".    Pt appears to adequately tolerate PMV placement w/out overt respiratory discomfort or distress; ANS remained stable at his Baseline during wear/use. Lurline Idol is downsized, cuffless, per ENT. Education provided on PMV use/wear. Discussed that PMV can be worn during the day/waking hours and w/ therapies, but it must NOT be worn when sleeping, drowsy. Encouraged Rest Breaks at times during the day as needed. It MUST be worn when eating/drinking. Precautions posted at bedside and in chart.  NSG updated. ST services can be available if any further needs while admitted.  Pt would benefit from f/u post discharge to continue working on pulmonary exs and strengthening for breath support for speech and for reaching goal of decannulation. OT to follow for UE/hand coordination for donning/doffing of PMV more independently.    HPI HPI: Pt is 76 YOM admitted for PNA, renail failure, AKI, hyperkalemia, CHF, Resp failure, sepsis, and is s/p trach placement on 02/25/22 -- #8 shiley XLT.   PMH includes: Morbid Obesity, ARDS, type 2 diabetes mellitus, hypertension, pancreatitis, ongoing tobacco abuse, systolic CHF, OSA, type 2 diabetes mellitus and diabetic neuropathy, lumbar radiculopathy.  Admitting DXs: Acute respiratory failure with hypoxia and hypercarbia. Prior to hospital admission, pt was Independent for mobility and ADLs. Pt lives with family, communication limited to yes/no with head nods 2/2 tracheostomy.   CXR on 8/16: mild pulmonary edema.  Pt is now on a Regular diet; wearing PMV PRN.      SLP Plan  All goals met at Acute level      Recommendations for follow up therapy are one component of a multi-disciplinary discharge planning process, led by the attending physician.  Recommendations may be updated based on patient status, additional functional criteria and insurance authorization.    Recommendations  Diet recommendations: Regular;Thin liquid Liquids provided via: Cup;Straw Medication  Administration: Whole meds with puree (as needed for ease of swallowing) Supervision: Patient able to self feed (tray setup) Compensations: Minimize environmental distractions;Slow rate;Small sips/bites;Follow solids with liquid Postural Changes and/or Swallow Maneuvers: Out of bed for meals;Seated upright 90 degrees;Upright 30-60 min after meal      Patient may use Passy-Muir Speech Valve: During all therapies with supervision;During all waking hours (remove during sleep);During PO intake/meals PMSV Supervision: Intermittent MD: Please consider changing trach tube to :  (trach downsized)         General recommendations:  (OT/PT following) Oral Care Recommendations: Oral care BID;Oral care before and after PO;Patient independent with oral care (setup) Follow Up Recommendations: Long-term institutional care without follow-up therapy (for ongoing therapies to improve independence) Assistance recommended at discharge: Set up Supervision/Assistance SLP Visit Diagnosis: Aphonia (R49.1) (tracheostomy; PMV wear PRN) Plan: All goals met             Orinda Kenner, MS, [place]; [place] [phone] (ascom) Graciella Arment  04/02/2022, 2:45 PM

## 2022-04-02 NOTE — Consult Note (Signed)
Samuel Chavez, Samuel Chavez [id number] May 18, 1976 Samuel Nearing, MD  Reason for Consult: Downsize trach Requesting Physician: Annita Brod, MD Consulting Physician: Samuel Chavez  HPI: This 46 y.o. year old male was admitted on 02/11/2022 for Acute respiratory failure ([place]) [J96.00] Respiratory failure ([place]) [J96.90] Peripheral edema [R60.9] Acute respiratory failure with hypoxia and hypercapnia ([place]) [J96.01, J96.02].  Seeing patient for follow-up.  He has been successfully taken off of the vent and is not requiring ventilatory support at night through his trach.  Discussed patient with Dr. Mortimer Fries and he does not feel patient will need positive pressure ventilatory support.  He feels his sleep apnea is purely obstructive with no obesity hypoventilation issues, so an uncuffed tracheostomy tube would be appropriate and would bypass upper airway obstruction with no need for BiPAP therapy as long as the tracheostomy is in place.  Allergies:  Allergies  Allergen Reactions   Hydrocodone Swelling    Medications:  Medications Prior to Admission  Medication Sig Dispense Refill   losartan (COZAAR) 100 MG tablet Take 100 mg by mouth daily.     OZEMPIC, 0.25 OR 0.5 MG/DOSE, 2 MG/3ML SOPN SMARTSIG:0.25 Milligram(s) SUB-Q Once a Week     torsemide (DEMADEX) 20 MG tablet Take 20 mg by mouth daily.     Aspirin-Salicylamide-Caffeine (BC HEADACHE POWDER PO) Take 1 packet by mouth as needed (for pain).     cephALEXin (KEFLEX) 500 MG capsule Take 1 capsule (500 mg total) by mouth 4 (four) times daily. (Patient not taking: Reported on 02/11/2022) 20 capsule 0   cyclobenzaprine (FLEXERIL) 10 MG tablet Take 0.5-1 tablets (5-10 mg total) by mouth 2 (two) times daily as needed for muscle spasms. (Patient not taking: Reported on 02/11/2022) 20 tablet 0   gabapentin (NEURONTIN) 600 MG tablet Take 600 mg by mouth at bedtime. (Patient not taking: Reported on 02/11/2022)     LABETALOL HCL PO Take by mouth. (Patient not taking:  Reported on 02/11/2022)     meloxicam (MOBIC) 15 MG tablet Take 1 tablet (15 mg total) by mouth daily. Take 1 daily with food. (Patient not taking: Reported on 02/11/2022) 10 tablet 0   metFORMIN (GLUCOPHAGE) 500 MG tablet Take 500 mg by mouth 2 (two) times daily with a meal. (Patient not taking: Reported on 02/11/2022)     METFORMIN HCL ER, MOD, PO Take by mouth. (Patient not taking: Reported on 02/11/2022)     METOPROLOL SUCCINATE ER PO Take by mouth. (Patient not taking: Reported on 02/11/2022)     oxyCODONE-acetaminophen (PERCOCET) 10-325 MG tablet Take 1 tablet by mouth See admin instructions. Take 1 tablet by mouth 4-5 times a day as needed for pain (Patient not taking: Reported on 02/11/2022)     oxyCODONE-acetaminophen (PERCOCET/ROXICET) 5-325 MG tablet Take 1 tablet by mouth every 8 (eight) hours as needed for severe pain. (Patient not taking: Reported on 02/11/2022) 4 tablet 0   predniSONE (DELTASONE) 20 MG tablet Take 2 tablets (40 mg total) by mouth daily. (Patient not taking: Reported on 02/11/2022) 6 tablet 0  .  Current Facility-Administered Medications  Medication Dose Route Frequency Provider Last Rate Last Admin   0.9 %  sodium chloride infusion  250 mL Intravenous Continuous Sakai, Isami, DO 10 mL/hr at 03/14/22 1036 New Bag at 03/14/22 1144   acetaminophen (TYLENOL) tablet 650 mg  650 mg Oral Q6H PRN Sakai, Isami, DO   650 mg at 04/02/22 6629   Or   acetaminophen (TYLENOL) suppository 650 mg  650 mg Rectal Q6H  PRN Lysle Pearl, Isami, DO       apixaban Arne Cleveland) tablet 5 mg  5 mg Oral BID Delena Bali, RPH   5 mg at 04/02/22 0932   camphor-menthol (SARNA) lotion   Topical PRN Sharion Settler, NP   Given at 04/02/22 0854   carbamide peroxide (DEBROX) 6.5 % OTIC (EAR) solution 5 drop  5 drop Left EAR BID Wyvonnia Dusky, MD   5 drop at 04/02/22 0842   carvedilol (COREG) tablet 6.25 mg  6.25 mg Oral BID WC Delena Bali, RPH   6.25 mg at 04/02/22 3557   Chlorhexidine Gluconate  Cloth 2 % PADS 6 each  6 each Topical Q0600 Sakai, Isami, DO   6 each at 04/02/22 0842   cyclobenzaprine (FLEXERIL) tablet 10 mg  10 mg Oral BID PRN Delena Bali, RPH   10 mg at 04/02/22 0446   diphenhydrAMINE (BENADRYL) capsule 25 mg  25 mg Oral Q6H PRN Wyvonnia Dusky, MD   25 mg at 04/02/22 0556   docusate sodium (COLACE) capsule 100 mg  100 mg Oral BID Nolberto Hanlon, MD   100 mg at 04/02/22 0839   escitalopram (LEXAPRO) tablet 10 mg  10 mg Oral Daily Delena Bali, RPH   10 mg at 04/02/22 0840   feeding supplement (ENSURE ENLIVE / ENSURE PLUS) liquid 237 mL  237 mL Oral TID BM Nolberto Hanlon, MD   237 mL at 04/02/22 0844   free water 30 mL  30 mL Per Tube BID Wyvonnia Dusky, MD   30 mL at 04/02/22 0842   gabapentin (NEURONTIN) tablet 600 mg  600 mg Oral BID Delena Bali, RPH   600 mg at 04/02/22 3220   insulin aspart (novoLOG) injection 0-20 Units  0-20 Units Subcutaneous TID AC Foust, Katy L, NP   3 Units at 04/02/22 0853   insulin detemir (LEVEMIR) injection 22 Units  22 Units Subcutaneous BID Sakai, Isami, DO   22 Units at 04/02/22 0853   ipratropium-albuterol (DUONEB) 0.5-2.5 (3) MG/3ML nebulizer solution 3 mL  3 mL Nebulization Q6H PRN Wyvonnia Dusky, MD   3 mL at 04/01/22 1936   lactulose (CHRONULAC) 10 GM/15ML solution 20 g  20 g Oral BID PRN Wyvonnia Dusky, MD   20 g at 03/28/22 1619   lidocaine (LIDODERM) 5 % 1 patch  1 patch Transdermal Q24H Darel Hong D, NP   1 patch at 04/01/22 2000   multivitamin with minerals tablet 1 tablet  1 tablet Oral Daily Nolberto Hanlon, MD   1 tablet at 04/02/22 0839   mupirocin ointment (BACTROBAN) 2 %   Nasal BID Benjamine Sprague, DO   Given at 04/01/22 2142   nutrition supplement (JUVEN) (JUVEN) powder packet 1 packet  1 packet Oral BID BM Wyvonnia Dusky, MD   1 packet at 04/02/22 0843   Oral care mouth rinse  15 mL Mouth Rinse PRN Darel Hong D, NP       oxyCODONE (Oxy IR/ROXICODONE) immediate release tablet  10 mg  10 mg Oral Q4H PRN Wyvonnia Dusky, MD   10 mg at 04/02/22 0556   pantoprazole (PROTONIX) EC tablet 40 mg  40 mg Oral Daily Delena Bali, RPH   40 mg at 04/02/22 0839   polyethylene glycol (MIRALAX / GLYCOLAX) packet 17 g  17 g Oral Daily Nolberto Hanlon, MD   17 g at 03/30/22 0859    PMH:  Past Medical History:  Diagnosis Date  Diabetes mellitus without complication ([place])    Hypertension    Pancreatitis     Fam Hx: History reviewed. No pertinent family history.  Soc Hx:  Social History   Socioeconomic History   Marital status: Single    Spouse name: Not on file   Number of children: Not on file   Years of education: Not on file   Highest education level: Not on file  Occupational History   Not on file  Tobacco Use   Smoking status: Every Day    Packs/day: 1.00    Types: Cigarettes   Smokeless tobacco: Never  Substance and Sexual Activity   Alcohol use: No   Drug use: No   Sexual activity: Not on file  Other Topics Concern   Not on file  Social History Narrative   Not on file   Social Determinants of Health   Financial Resource Strain: Not on file  Food Insecurity: Not on file  Transportation Needs: Not on file  Physical Activity: Not on file  Stress: Not on file  Social Connections: Not on file  Intimate Partner Violence: Not on file    PSH:  Past Surgical History:  Procedure Laterality Date   CHOLECYSTECTOMY     TRACHEOSTOMY TUBE PLACEMENT N/A 02/25/2022   Procedure: TRACHEOSTOMY;  Surgeon: Clyde Canterbury, MD;  Location: [facility] ORS;  Service: ENT;  Laterality: N/A;  . Procedures since admission: No admission procedures for hospital encounter.  ROS: Review of systems normal other than 12 systems except per HPI.  PHYSICAL EXAM Vitals:  Vitals:   04/02/22 0847 04/02/22 1021  BP: 139/83   Pulse: 94 90  Resp: 18 20  Temp: 98.6 F (37 C)   SpO2: 100% 100%  . General: Well-developed, Well-nourished in no acute distress Mood: Mood and  affect well adjusted, pleasant and cooperative. Orientation: Grossly alert and oriented. Vocal Quality: No hoarseness. Communicates verbally. head and Face: NCAT. No facial asymmetry. No visible skin lesions. No significant facial scars. No tenderness with sinus percussion. Facial strength normal and symmetric. Neck: Supple and symmetric with no palpable masses, tenderness or crepitance. The tracheostomy tube is in place with the cuff down.  No evidence of infection around the stoma.  The #8 cuffed proximal XLT trach was removed and easily replaced with a #6 cuffless proximal XLT trach.  There was some brief bleeding from the stoma site which resolved spontaneously.  The patient tolerated the procedure well. Lymphatic: Cervical lymph nodes are without palpable lymphadenopathy or tenderness. Respiratory: Normal respiratory effort without labored breathing.  MEDICAL DECISION MAKING: Data Review:  Results for orders placed or performed during the hospital encounter of 02/11/22 (from the past 48 hour(s))  Glucose, capillary     Status: Abnormal   Collection Time: 03/31/22  4:10 PM  Result Value Ref Range   Glucose-Capillary 153 (H) 70 - 99 mg/dL    Comment: Glucose reference range applies only to samples taken after fasting for at least 8 hours.  Glucose, capillary     Status: Abnormal   Collection Time: 03/31/22  8:50 PM  Result Value Ref Range   Glucose-Capillary 122 (H) 70 - 99 mg/dL    Comment: Glucose reference range applies only to samples taken after fasting for at least 8 hours.  Magnesium     Status: None   Collection Time: 04/01/22  4:29 AM  Result Value Ref Range   Magnesium 1.7 1.7 - 2.4 mg/dL    Comment: Performed at [facility], [address] [place], [place] [address]  Glucose, capillary     Status: Abnormal   Collection Time: 04/01/22  8:32 AM  Result Value Ref Range   Glucose-Capillary 112 (H) 70 - 99 mg/dL    Comment: Glucose reference range applies only to  samples taken after fasting for at least 8 hours.   Comment 1 Notify RN    Comment 2 Document in Chart   CBC     Status: Abnormal   Collection Time: 04/01/22  8:48 AM  Result Value Ref Range   WBC 8.2 4.0 - 10.5 K/uL   RBC 3.45 (L) 4.22 - 5.81 MIL/uL   Hemoglobin 9.2 (L) 13.0 - 17.0 g/dL   HCT 32.4 (L) 39.0 - 52.0 %   MCV 93.9 80.0 - 100.0 fL   MCH 26.7 26.0 - 34.0 pg   MCHC 28.4 (L) 30.0 - 36.0 g/dL   RDW 17.3 (H) 11.5 - 15.5 %   Platelets 264 150 - 400 K/uL   nRBC 0.2 0.0 - 0.2 %    Comment: Performed at [facility], [address], [place], [place] [address]  Basic metabolic panel     Status: Abnormal   Collection Time: 04/01/22  8:48 AM  Result Value Ref Range   Sodium 138 135 - 145 mmol/L   Potassium 4.5 3.5 - 5.1 mmol/L   Chloride 97 (L) 98 - 111 mmol/L   CO2 33 (H) 22 - 32 mmol/L   Glucose, Bld 126 (H) 70 - 99 mg/dL    Comment: Glucose reference range applies only to samples taken after fasting for at least 8 hours.   BUN 9 6 - 20 mg/dL   Creatinine, Ser 0.58 (L) 0.61 - 1.24 mg/dL   Calcium 8.7 (L) 8.9 - 10.3 mg/dL   GFR, Estimated >60 >60 mL/min    Comment: (NOTE) Calculated using the CKD-EPI Creatinine Equation (2021)    Anion gap 8 5 - 15    Comment: Performed at [facility], [address] [place], [place] [address]  Glucose, capillary     Status: Abnormal   Collection Time: 04/01/22 11:48 AM  Result Value Ref Range   Glucose-Capillary 148 (H) 70 - 99 mg/dL    Comment: Glucose reference range applies only to samples taken after fasting for at least 8 hours.  Glucose, capillary     Status: Abnormal   Collection Time: 04/01/22  4:16 PM  Result Value Ref Range   Glucose-Capillary 153 (H) 70 - 99 mg/dL    Comment: Glucose reference range applies only to samples taken after fasting for at least 8 hours.  Glucose, capillary     Status: Abnormal   Collection Time: 04/01/22  9:25 PM  Result Value Ref Range   Glucose-Capillary 123 (H) 70 - 99  mg/dL    Comment: Glucose reference range applies only to samples taken after fasting for at least 8 hours.  Magnesium     Status: Abnormal   Collection Time: 04/02/22  4:34 AM  Result Value Ref Range   Magnesium 1.6 (L) 1.7 - 2.4 mg/dL    Comment: Performed at [facility], [address] [place], [place] [address]  CBC     Status: Abnormal   Collection Time: 04/02/22  4:34 AM  Result Value Ref Range   WBC 9.0 4.0 - 10.5 K/uL   RBC 3.15 (L) 4.22 - 5.81 MIL/uL   Hemoglobin 8.6 (L) 13.0 - 17.0 g/dL   HCT 29.8 (L) 39.0 -  52.0 %   MCV 94.6 80.0 - 100.0 fL   MCH 27.3 26.0 - 34.0 pg   MCHC 28.9 (L) 30.0 - 36.0 g/dL   RDW 17.1 (H) 11.5 - 15.5 %   Platelets 262 150 - 400 K/uL   nRBC 0.0 0.0 - 0.2 %    Comment: Performed at [facility], [address] [place], [place] [address]  Basic metabolic panel     Status: Abnormal   Collection Time: 04/02/22  4:34 AM  Result Value Ref Range   Sodium 135 135 - 145 mmol/L   Potassium 4.3 3.5 - 5.1 mmol/L   Chloride 97 (L) 98 - 111 mmol/L   CO2 33 (H) 22 - 32 mmol/L   Glucose, Bld 117 (H) 70 - 99 mg/dL    Comment: Glucose reference range applies only to samples taken after fasting for at least 8 hours.   BUN 12 6 - 20 mg/dL   Creatinine, Ser 0.65 0.61 - 1.24 mg/dL   Calcium 8.4 (L) 8.9 - 10.3 mg/dL   GFR, Estimated >60 >60 mL/min    Comment: (NOTE) Calculated using the CKD-EPI Creatinine Equation (2021)    Anion gap 5 5 - 15    Comment: Performed at [facility], [address] [place], [place] [address]  Glucose, capillary     Status: Abnormal   Collection Time: 04/02/22  8:38 AM  Result Value Ref Range   Glucose-Capillary 142 (H) 70 - 99 mg/dL    Comment: Glucose reference range applies only to samples taken after fasting for at least 8 hours.  . No results found.Marland Kitchen   PROCEDURE: Procedure: Diagnostic Fiberoptic tracheoscopy Diagnosis: Obstructive sleep apnea, previous respiratory failure status post  tracheostomy Indications: Confirm proper trach tube positioning Findings: Tracheostomy tube appears to be in good position.  There is no evidence of any granulation tissue or irritation of the trachea at the distal tip of the tracheostomy tube. Description of Procedure: After discussing procedure a flexible fiberoptic scope was passed through the tracheostomy tube until the distal airway could be visualized.  The tube appeared to be in excellent position with no pathology at the distal tip of the tube.  The scope was withdrawn. The patient tolerated the procedure well.  ASSESSMENT: Status post tracheostomy for respiratory failure.  The patient also has morbid obesity and obstructive sleep apnea.  Successfully exchanged his #8 cuffed XLT proximal tube for a #6 cuffless XLT proximal tube.  PLAN: The patient expressed significant concerns about having to keep the tracheostomy tube.  I did explain that this helps bypass his upper airway so that the obstructive sleep apnea is relieved.  It is certainly a good idea to keep the tube in place for now as he continues to recover from his illness in case he has a setback and requires further ventilatory support.  Would recommend pulmonology further discuss their long-term outlook with the patient and whether they feel the tracheostomy tube could be eventually removed at some point.   Samuel Nearing, MD 04/02/2022 11:45 AM

## 2022-04-03 DIAGNOSIS — I5023 Acute on chronic systolic (congestive) heart failure: Secondary | ICD-10-CM | POA: Diagnosis not present

## 2022-04-03 DIAGNOSIS — E1142 Type 2 diabetes mellitus with diabetic polyneuropathy: Secondary | ICD-10-CM | POA: Diagnosis not present

## 2022-04-03 DIAGNOSIS — J9601 Acute respiratory failure with hypoxia: Secondary | ICD-10-CM | POA: Diagnosis not present

## 2022-04-03 DIAGNOSIS — I160 Hypertensive urgency: Secondary | ICD-10-CM | POA: Diagnosis not present

## 2022-04-03 LAB — GLUCOSE, CAPILLARY
Glucose-Capillary: 131 mg/dL — ABNORMAL HIGH (ref 70–99)
Glucose-Capillary: 146 mg/dL — ABNORMAL HIGH (ref 70–99)
Glucose-Capillary: 158 mg/dL — ABNORMAL HIGH (ref 70–99)
Glucose-Capillary: 108 mg/dL — ABNORMAL HIGH (ref 70–99)
Glucose-Capillary: 201 mg/dL — ABNORMAL HIGH (ref 70–99)

## 2022-04-03 MED ORDER — MAGNESIUM SULFATE 2 GM/50ML IV SOLN
2.0000 g | Freq: Once | INTRAVENOUS | Status: AC
  Administered 2022-04-03: 2 g via INTRAVENOUS
  Filled 2022-04-03: qty 50

## 2022-04-03 NOTE — Progress Notes (Signed)
PROGRESS NOTE    46 y.o. 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  Patient states he has been taking his medication without improvement and reports 30 pound weight gain.  Patient found to be significantly hypoxic and hypercarbic and placed on BiPAP.  Following admission, he decompensated and was intubated.   Pt has been medically stable for d/c but no LTAC has taken the pt so far. Today, pt decided he did not want to go to [facility] and his daughter can allegedly provided 24-7 care. Pt's daughter will have to come in and learn trach care & other care needed to take of pt. Also, CM is working DME needs & home health as well. Of note, pt's trach will be changed tomorrow via ENT, Dr. Virgia Land. Also, PT/OT today changed their recommendation to CIR and he is pending evaluation.    Samuel Chavez  LDJ:[id number] DOB: 25-Dec-1975 DOA: 02/11/2022 PCP: [facility]  Assessment & Plan:   Principal Problem:   Acute respiratory failure with hypoxia and hypercarbia ([place]) Active Problems:   Acute on chronic systolic CHF (congestive heart failure) (HCC)   Hypertensive urgency   Type 2 diabetes mellitus with peripheral neuropathy (HCC)   Acute heart failure with preserved ejection fraction (HCC)   Renal failure (ARF), acute on chronic (HCC)   Hospital-acquired pneumonia   ARDS (adult respiratory distress syndrome) (HCC)   Pressure injury of skin   Constipation  Assessment and Plan: Acute on chronic hypoxemic and hypercapneic respiratory failure: s/p tracheostomy. Hx of OSA/OHS. Continue w/ trach collar & supplemental oxygen.  Seen by general surgery and underwent fiberoptic tracheoscopy to confirm  proper placement of tube.   MRSA HCAP: completed linezolid course.  Provetella sinusitis & has completed unasyn/augmentin. ID signed off  Muffled hearing: of left eye. Secondary to wax build up. S/p ear flushing but still w/ c/o muffled hearing. Continue w/ ear wax drops   AKI: resolved   Hypernatremia: WNL. Continue w/ free water flushes  Constipation: lactulose prn   Hyperkalemia: resolved  Hypomagnesemia: Resolved   DM2: likely poorly controlled. Continue on levemir, SSI w/ accuchecks. Tube feeds have been d/c and pt is eating by mouth. Per surgery, gastric tube sutures taken off & do NOT recommend removing the tube yet since the track hasnt matured yet.  Give it another 2 weeks before considering removal as per gen surg    Normocytic anemia:  H&H are labile. 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 EEG 8/16 neg. Needs rehab, see PT/OT notes & CM's notes.  PICC associated DVT: PICC removed. Continue on eliquis   Depression: severity unknown. Continue on lexapro   Left leg pain: w/ hx of sciatica as per pt.  LLE [place] was neg for DVT. Continue on gabapentin, flexeril    Morbidly obese: BMI 57.8. Complicates overall care & prognosis      DVT prophylaxis: eliquis  Code Status: full  Family Communication: Left message for family Disposition Plan: Possible CIR, if approved  Level of care: Med-Surg  Status is: Inpatient Remains inpatient appropriate because: Disposition determination   Consultants:  ICU Gen surg  Nephro  ID  Cardio   Procedures:   Antimicrobials:  Completed antibiotic  course  Subjective: Patient doing well, breathing comfortably  Objective: Vitals:   04/03/22 0605 04/03/22 0743 04/03/22 0815 04/03/22 1724  BP:  (!) 157/94  133/74  Pulse:  89  89  Resp:  14  17  Temp:    98.6 F (37 C)  TempSrc:    Oral  SpO2: 96% 95% 95% 98%  Weight:       Height:        Intake/Output Summary (Last 24 hours) at 04/03/2022 1759 Last data filed at 04/03/2022 1609 Gross per 24 hour  Intake --  Output 3400 ml  Net -3400 ml    Filed Weights   03/24/22 1508 03/24/22 1714 03/28/22 0500  Weight: (!) 182.8 kg (!) 191 kg (!) 177.8 kg    Examination:  General exam: Alert and oriented x3, no acute distress Respiratory system: diminished breath sounds b/l secondary to body habitus. Trach in place Cardiovascular system: Regular rate and rhythm, S1-S2 Gastrointestinal system: Soft, nontender, obese, positive bowel sounds Psychiatry: Appropriate, no evidence of psychoses Unchanged from previous day   Data Reviewed: No labs today  CBC: Recent Labs  Lab 03/29/22 0441 03/30/22 0710 03/31/22 0540 04/01/22 0848 04/02/22 0434  WBC 7.0 7.3 8.2 8.2 9.0  HGB 8.5* 8.5* 8.7* 9.2* 8.6*  HCT 29.8* 29.6* 30.1* 32.4* 29.8*  MCV 94.0 93.7 95.0 93.9 94.6  PLT 253 265 253 264 177    Basic Metabolic Panel: Recent Labs  Lab 03/29/22 0441 03/30/22 0710 03/31/22 0540 04/01/22 0429 04/01/22 0848 04/02/22 0434  NA 139 142 138  --  138 135  K 4.1 4.2 4.0  --  4.5 4.3  CL 98 100 98  --  97* 97*  CO2 33* 35* 34*  --  33* 33*  GLUCOSE 117* 103* 149*  --  126* 117*  BUN 10 7 11   --  9 12  CREATININE 0.59* 0.56* 0.65  --  0.58* 0.65  CALCIUM 8.5* 8.5* 8.5*  --  8.7* 8.4*  MG 1.7 1.6* 1.7 1.7  --  1.6*    GFR: Estimated Creatinine Clearance: 189.2 mL/min (by C-G formula based on SCr of 0.65 mg/dL). Liver Function Tests: No results for input(s): "AST", "ALT", "ALKPHOS", "BILITOT", "PROT", "ALBUMIN" in the last 168 hours.  No results for input(s): "LIPASE", "AMYLASE" in the last 168 hours. No results for input(s): "AMMONIA" in the last 168 hours. Coagulation Profile: No results for input(s): "INR", "PROTIME" in the last 168 hours. Cardiac Enzymes: No results for input(s): "CKTOTAL", "CKMB", "CKMBINDEX", "TROPONINI" in the last 168 hours. BNP  (last 3 results) No results for input(s): "PROBNP" in the last 8760 hours. HbA1C: No results for input(s): "HGBA1C" in the last 72 hours. CBG: Recent Labs  Lab 04/02/22 2054 04/03/22 0843 04/03/22 0859 04/03/22 1213 04/03/22 1723  GLUCAP 174* 131* 146* 158* 108*    Lipid Profile: No results for input(s): "CHOL", "HDL", "LDLCALC", "TRIG", "CHOLHDL", "LDLDIRECT" in the last 72 hours. Thyroid Function Tests: No results for input(s): "TSH", "T4TOTAL", "FREET4", "T3FREE", "THYROIDAB" in the last 72 hours. Anemia Panel: No results for input(s): "VITAMINB12", "FOLATE", "FERRITIN", "TIBC", "IRON", "RETICCTPCT" in the last 72 hours. Sepsis Labs: No results for input(s): "PROCALCITON", "LATICACIDVEN" in the last 168 hours.  No results found for this or any previous visit (from the past 240 hour(s)).       Radiology Studies: No results found.      Scheduled Meds:  apixaban  5 mg Oral BID   carbamide peroxide  5 drop  Left EAR BID   carvedilol  6.25 mg Oral BID WC   Chlorhexidine Gluconate Cloth  6 each Topical Q0600   cholecalciferol  2,000 Units Oral Daily   docusate sodium  100 mg Oral BID   escitalopram  10 mg Oral Daily   free water  30 mL Per Tube BID   gabapentin  600 mg Oral BID   insulin aspart  0-20 Units Subcutaneous TID AC   insulin detemir  22 Units Subcutaneous BID   lidocaine  1 patch Transdermal Q24H   multivitamin with minerals  1 tablet Oral Daily   mupirocin ointment   Nasal BID   nutrition supplement (JUVEN)  1 packet Oral BID BM   pantoprazole  40 mg Oral Daily   polyethylene glycol  17 g Oral Daily   Ensure Max Protein  11 oz Oral TID   Continuous Infusions:  sodium chloride 10 mL/hr at 03/14/22 1036     LOS: 51 days     Annita Brod, MD [facility] Pager [phone]  If 7PM-7AM, please contact night-coverage [email or web address] 04/03/2022, 5:59 PM

## 2022-04-03 NOTE — TOC Progression Note (Signed)
Transition of Care [facility]) - Progression Note    Patient Details  Name: Samuel Chavez MRN: [id number] Date of Birth: January 16, 1976  [facility] CM/SW Contact  Beverly Sessions, RN Phone Number: 04/03/2022, 9:17 AM  Clinical Narrative:     Secure chat sent to PT and CIR admissions coordinator to determine what goals would need to be met prior to insurance auth being initiated for CIR  TOC to hold off having any DME delivered to the home until it is determine if patient will be admitted to [facility]  For discharge planning Rob at [facility] has referral for private duty nursing.  Rob confirms patient does have Private duty nursing benefits.  He anticipates the office with accept the referral, he is awaiting the determination and staffing availability before starting auth.  If patient does go to CIR he will follow there   Expected Discharge Plan:  (TBD) Barriers to Discharge: Continued Medical Work up  Expected Discharge Plan and Services Expected Discharge Plan:  (TBD)   Discharge Planning Services: CM Consult   Living arrangements for the past 2 months: Single Family Home                                       Social Determinants of Health (SDOH) Interventions    Readmission Risk Interventions     No data to display

## 2022-04-03 NOTE — Psychosocial Assessment (Signed)
Inpatient Rehabilitation Admissions Coordinator   I will begin Auth with insurance for a possible CIR admit pending their approval.  Danne Baxter, RN, MSN Rehab Admissions Coordinator [phone] 04/03/2022 1:51 PM

## 2022-04-03 NOTE — Progress Notes (Addendum)
  Inpatient Rehabilitation Admissions Coordinator   Discussed with Tera Helper with acute PT and TOC. Await further progress with therapy today before proceeding with insurance Auth for possible Cir admit.  Would like to see transfers to chair rather than [facility] lift to chair as goal before proceeding with insurance Auth.I contacted his daughter, Angelica Pou, per his request. She is in agreement to Cir admit if possible and she is making plans to move to a new home in next 1 to 2 weeks. She will call me back with that address.  Danne Baxter, RN, MSN Rehab Admissions Coordinator [phone] 04/03/2022 8:41 AM

## 2022-04-03 NOTE — Progress Notes (Signed)
Physical Therapy Treatment Patient Details Name: Samuel Chavez MRN: [id number] DOB: 23-Sep-1975 Today's Date: 04/03/2022   History of Present Illness Pt is 39 YOM admitted for PNA, renail failure, AKI, hyperkalemia, CHF, Resp failure, sepsis, and is s/p prolonged ventilation, trach placement. PMH includes: DM, HTN, sCHF, OSA, obesity, ARDS Resp failure, sepsis.    PT Comments    Pt was long sitting in bed upon arriving. Supportive son in room. Pt is alert and oriented + cooperative throughout. Remains motivated to improve. He was able to roll L to short sit with increased time + min/mod assist. Sat EOB x > 8 minutes with close supervision only. He stood 1 x EOB (elevated) with +2 assist for safety. Min-mod to achieve standing x ~ 20 sec. With BUE support on bariatric RW. Session progressed to standing and pivoting to recliner from elevated bed height. Used bariatric RW and pt positioned at 45 degree angle to decrease amount of pivoting required. +2 mod assist to safely stand pivot. Once seated in chair, pt was able to stand 1 more time from lower recliner surface. Continued to encouraged there ex in recliner and when in bed to promote strengthening. Pt states understanding and is progressing. Acute PT feels pt will greatly benefit from inpatient rehab at DC to maximize independence prior to returning home.    Recommendations for follow up therapy are one component of a multi-disciplinary discharge planning process, led by the attending physician.  Recommendations may be updated based on patient status, additional functional criteria and insurance authorization.  Follow Up Recommendations  Acute inpatient rehab (3hours/day) Can patient physically be transported by private vehicle: No   Assistance Recommended at Discharge Frequent or constant Supervision/Assistance  Patient can return home with the following Two people to help with walking and/or transfers;Two people to help with  bathing/dressing/bathroom;Direct supervision/assist for medications management;Help with stairs or ramp for entrance;Assist for transportation;Assistance with feeding;Assistance with cooking/housework;Direct supervision/assist for financial management   Equipment Recommendations  Other (comment) (Defer to next level of care)       Precautions / Restrictions Precautions Precautions: Fall Precaution Comments: trach with PMV Restrictions Weight Bearing Restrictions: No     Mobility  Bed Mobility Overal bed mobility: Needs Assistance Bed Mobility: Rolling, Sidelying to Sit, Supine to Sit Rolling: Supervision (increased time + use of bedrail) Sidelying to sit: Min assist, Mod assist Supine to sit: Min assist, Mod assist     General bed mobility comments: pt was able to progress from supine to short sit with min-mod assist + increased time and vcs    Transfers Overall transfer level: Needs assistance Equipment used: Rolling walker (2 wheels) (bariatric) Transfers: Sit to/from Stand, Bed to chair/wheelchair/BSC Sit to Stand: +2 safety/equipment, +2 physical assistance, Min assist, Mod assist, From elevated surface Stand pivot transfers: Mod assist, +2 physical assistance, From elevated surface, +2 safety/equipment         General transfer comment: Pt was able to perform transfers with decreased overall assistance. he stood EOB 1 x, stood pivot to recliner, then stood one time from lower recliner surface. Vcs and increased time to perform.    Ambulation/Gait  General Gait Details: Unable to ambulate currently but does demonstrate overall improving abilities and progress towards returning to PLOF.      Balance Overall balance assessment: Needs assistance Sitting-balance support: No upper extremity supported, Feet supported Sitting balance-Leahy Scale: Fair     Standing balance support: Bilateral upper extremity supported Standing balance-Leahy Scale: Poor Standing balance  comment:  Very limited standing endurance however is able to maintain standing balance with BUE support. Unable to stand without UE support.       Cognition Arousal/Alertness: Awake/alert Behavior During Therapy: WFL for tasks assessed/performed Overall Cognitive Status: Within Functional Limits for tasks assessed      General Comments: pt was A and O x 4. Agreeable and cooperative throughout. Is motivated to improve independence        Exercises Total Joint Exercises Long Arc Quad: AROM, 10 reps, Both        Pertinent Vitals/Pain Pain Assessment Pain Assessment: 0-10 Pain Score: 3  Pain Location: back and L knee Pain Descriptors / Indicators: Aching, Discomfort Pain Intervention(s): Limited activity within patient's tolerance, Monitored during session, Premedicated before session, Patient requesting pain meds-RN notified     PT Goals (current goals can now be found in the care plan section) Acute Rehab PT Goals Patient Stated Goal: get stronger so I can return home Progress towards PT goals: Progressing toward goals    Frequency    Min 2X/week      PT Plan Current plan remains appropriate    Co-evaluation     PT goals addressed during session: Mobility/safety with mobility;Strengthening/ROM        AM-PAC PT "6 Clicks" Mobility   Outcome Measure  Help needed turning from your back to your side while in a flat bed without using bedrails?: A Little Help needed moving from lying on your back to sitting on the side of a flat bed without using bedrails?: A Lot Help needed moving to and from a bed to a chair (including a wheelchair)?: Total Help needed standing up from a chair using your arms (e.g., wheelchair or bedside chair)?: Total Help needed to walk in hospital room?: Total Help needed climbing 3-5 steps with a railing? : Total 6 Click Score: 9    End of Session Equipment Utilized During Treatment: Oxygen Activity Tolerance: Patient tolerated treatment  well;Patient limited by fatigue Patient left: in chair;with call bell/phone within reach;with family/visitor present Nurse Communication: Mobility status PT Visit Diagnosis: Muscle weakness (generalized) (M62.81);Other abnormalities of gait and mobility (R26.89)     Time: 7867-5449 PT Time Calculation (min) (ACUTE ONLY): 25 min  Charges:  $Therapeutic Activity: 23-37 mins                    Julaine Fusi PTA 04/03/22, 12:57 PM

## 2022-04-04 LAB — BASIC METABOLIC PANEL
Anion gap: 9 (ref 5–15)
BUN: 8 mg/dL (ref 6–20)
CO2: 30 mmol/L (ref 22–32)
Calcium: 8.3 mg/dL — ABNORMAL LOW (ref 8.9–10.3)
Chloride: 100 mmol/L (ref 98–111)
Creatinine, Ser: 0.67 mg/dL (ref 0.61–1.24)
GFR, Estimated: >60 mL/min (ref >60)
Glucose, Bld: 137 mg/dL — ABNORMAL HIGH (ref 70–99)
Potassium: 3.8 mmol/L (ref 3.5–5.1)
Sodium: 139 mmol/L (ref 135–145)

## 2022-04-04 LAB — GLUCOSE, CAPILLARY
Glucose-Capillary: 133 mg/dL — ABNORMAL HIGH (ref 70–99)
Glucose-Capillary: 195 mg/dL — ABNORMAL HIGH (ref 70–99)
Glucose-Capillary: 146 mg/dL — ABNORMAL HIGH (ref 70–99)
Glucose-Capillary: 107 mg/dL — ABNORMAL HIGH (ref 70–99)

## 2022-04-04 MED ORDER — VITAMIN D (ERGOCALCIFEROL) 1.25 MG (50000 UNIT) PO CAPS
50000.0000 [IU] | ORAL_CAPSULE | ORAL | Status: DC
  Administered 2022-04-05: 50000 [IU] via ORAL
  Filled 2022-04-04: qty 1

## 2022-04-04 MED ORDER — DICLOFENAC SODIUM 1 % EX GEL
2.0000 g | Freq: Four times a day (QID) | CUTANEOUS | Status: DC
  Administered 2022-04-04 – 2022-04-06 (×7): 2 g via TOPICAL
  Filled 2022-04-04: qty 100

## 2022-04-04 NOTE — Consult Note (Addendum)
Physical Medicine and Rehabilitation Consult Reason for Consult: Assess candidacy for CIR Referring Physician: Gevena Barre, MS   HPI: Samuel Chavez is a 46 y.o. male morbidly obese male with PMH of DM2 with neuropathy, HTN, pancreatitis, tobacco abuse, systolic CHF, OSA, lumbar radiculopathy, who presented to the ER on 7/25 with increasing lower extremity edema and progressive shortness of breath. He reported 30 pound weight gain. He was hypoxic and hypercarbic and was placed on BiPAP and then intubated due to decompensation. Daughter can provide 24/7 care. Physical Medicine & Rehabilitation was consulted to assess candidacy for CIR.     ROS denies shortness of breath, decreased endurance, balance, +back and left knee pain Past Medical History:  Diagnosis Date   Diabetes mellitus without complication ([place])    Hypertension    Pancreatitis    Past Surgical History:  Procedure Laterality Date   CHOLECYSTECTOMY     TRACHEOSTOMY TUBE PLACEMENT N/A 02/25/2022   Procedure: TRACHEOSTOMY;  Surgeon: Clyde Canterbury, MD;  Location: [facility] ORS;  Service: ENT;  Laterality: N/A;   History reviewed. No pertinent family history. Social History:  reports that he has been smoking cigarettes. He has been smoking an average of 1 pack per day. He has never used smokeless tobacco. He reports that he does not drink alcohol and does not use drugs. Allergies:  Allergies  Allergen Reactions   Hydrocodone Swelling   Medications Prior to Admission  Medication Sig Dispense Refill   losartan (COZAAR) 100 MG tablet Take 100 mg by mouth daily.     OZEMPIC, 0.25 OR 0.5 MG/DOSE, 2 MG/3ML SOPN SMARTSIG:0.25 Milligram(s) SUB-Q Once a Week     torsemide (DEMADEX) 20 MG tablet Take 20 mg by mouth daily.     Aspirin-Salicylamide-Caffeine (BC HEADACHE POWDER PO) Take 1 packet by mouth as needed (for pain).     cephALEXin (KEFLEX) 500 MG capsule Take 1 capsule (500 mg total) by mouth 4 (four) times daily.  (Patient not taking: Reported on 02/11/2022) 20 capsule 0   cyclobenzaprine (FLEXERIL) 10 MG tablet Take 0.5-1 tablets (5-10 mg total) by mouth 2 (two) times daily as needed for muscle spasms. (Patient not taking: Reported on 02/11/2022) 20 tablet 0   gabapentin (NEURONTIN) 600 MG tablet Take 600 mg by mouth at bedtime. (Patient not taking: Reported on 02/11/2022)     LABETALOL HCL PO Take by mouth. (Patient not taking: Reported on 02/11/2022)     meloxicam (MOBIC) 15 MG tablet Take 1 tablet (15 mg total) by mouth daily. Take 1 daily with food. (Patient not taking: Reported on 02/11/2022) 10 tablet 0   metFORMIN (GLUCOPHAGE) 500 MG tablet Take 500 mg by mouth 2 (two) times daily with a meal. (Patient not taking: Reported on 02/11/2022)     METFORMIN HCL ER, MOD, PO Take by mouth. (Patient not taking: Reported on 02/11/2022)     METOPROLOL SUCCINATE ER PO Take by mouth. (Patient not taking: Reported on 02/11/2022)     oxyCODONE-acetaminophen (PERCOCET) 10-325 MG tablet Take 1 tablet by mouth See admin instructions. Take 1 tablet by mouth 4-5 times a day as needed for pain (Patient not taking: Reported on 02/11/2022)     oxyCODONE-acetaminophen (PERCOCET/ROXICET) 5-325 MG tablet Take 1 tablet by mouth every 8 (eight) hours as needed for severe pain. (Patient not taking: Reported on 02/11/2022) 4 tablet 0   predniSONE (DELTASONE) 20 MG tablet Take 2 tablets (40 mg total) by mouth daily. (Patient not taking: Reported on 02/11/2022) 6  tablet 0    Home: Home Living Family/patient expects to be discharged to:: Private residence Living Arrangements: Children, Spouse/significant other Available Help at Discharge: Family Additional Comments: pt unable to provide hx, no family present to assist  Functional History: Prior Function Prior Level of Function : Independent/Modified Independent Mobility Comments: pt reports ambulatory without AD community distances - communicated via head nods/shakes however mildly delayed  responses Functional Status:  Mobility: Bed Mobility Overal bed mobility: Needs Assistance Bed Mobility: Rolling, Sidelying to Sit, Supine to Sit Rolling: Supervision (increased time + use of bedrail) Sidelying to sit: Min assist, Mod assist Supine to sit: Min assist, Mod assist Sit to supine: Max assist General bed mobility comments: pt was able to progress from supine to short sit with min-mod assist + increased time and vcs Transfers Overall transfer level: Needs assistance Equipment used: Rolling walker (2 wheels) (bariatric) Transfers: Sit to/from Stand, Bed to chair/wheelchair/BSC Sit to Stand: +2 safety/equipment, +2 physical assistance, Min assist, Mod assist, From elevated surface Bed to/from chair/wheelchair/BSC transfer type:: Stand pivot Stand pivot transfers: Mod assist, +2 physical assistance, From elevated surface, +2 safety/equipment Transfer via Lift Equipment:  (hoyer) General transfer comment: Pt was able to perform transfers with decreased overall assistance. he stood EOB 1 x, stood pivot to recliner, then stood one time from lower recliner surface. Vcs and increased time to perform. Ambulation/Gait General Gait Details: Unable to ambulate currently but does demonstrate overall improving abilities and progress towards returning to PLOF.    ADL: ADL Overall ADL's : Needs assistance/impaired General ADL Comments: MAX A don B socks in sitting. SUPERVISION seated grooming tasks.  Cognition: Cognition Overall Cognitive Status: Within Functional Limits for tasks assessed Orientation Level: Oriented X4 Cognition Arousal/Alertness: Awake/alert Behavior During Therapy: WFL for tasks assessed/performed Overall Cognitive Status: Within Functional Limits for tasks assessed General Comments: pt was A and O x 4. Agreeable and cooperative throughout. Is motivated to improve independence Difficult to assess due to: Tracheostomy  Blood pressure (!) 152/80, pulse 88,  temperature 98.5 F (36.9 C), temperature source Oral, resp. rate 18, height 5\' 9"  (1.753 m), weight (!) 177.8 kg, SpO2 100 %. Physical Exam Gen: no distress, normal appearing, weight 177.8kg HEENT: oral mucosa pink and moist, NCAT, trach in place Cardio: Reg rate Chest: normal effort, normal rate of breathing Abd: soft, non-distended Ext: no edema Psych: pleasant, normal affect Skin: intact MSK/Neuro: Alert and oriented x3. Diffuse weakness.   Results for orders placed or performed during the hospital encounter of 02/11/22 (from the past 24 hour(s))  Glucose, capillary     Status: Abnormal   Collection Time: 04/03/22  5:23 PM  Result Value Ref Range   Glucose-Capillary 108 (H) 70 - 99 mg/dL  Glucose, capillary     Status: Abnormal   Collection Time: 04/03/22  7:58 PM  Result Value Ref Range   Glucose-Capillary 201 (H) 70 - 99 mg/dL  Basic metabolic panel     Status: Abnormal   Collection Time: 04/04/22  4:00 AM  Result Value Ref Range   Sodium 139 135 - 145 mmol/L   Potassium 3.8 3.5 - 5.1 mmol/L   Chloride 100 98 - 111 mmol/L   CO2 30 22 - 32 mmol/L   Glucose, Bld 137 (H) 70 - 99 mg/dL   BUN 8 6 - 20 mg/dL   Creatinine, Ser 0.67 0.61 - 1.24 mg/dL   Calcium 8.3 (L) 8.9 - 10.3 mg/dL   GFR, Estimated >60 >60 mL/min   Anion gap  9 5 - 15  Glucose, capillary     Status: Abnormal   Collection Time: 04/04/22  7:47 AM  Result Value Ref Range   Glucose-Capillary 133 (H) 70 - 99 mg/dL  Glucose, capillary     Status: Abnormal   Collection Time: 04/04/22 11:21 AM  Result Value Ref Range   Glucose-Capillary 195 (H) 70 - 99 mg/dL   No results found.   Assessment/Plan: Diagnosis: Critical illness myopathy Does the need for close, 24 hr/day medical supervision in concert with the patient's rehab needs make it unreasonable for this patient to be served in a less intensive setting? Yes Co-Morbidities requiring supervision/potential complications:  Low back pain: may benefit from  kpad Left knee pain: may benefit from voltaren gel. Obesity: provide dietary education Type 2 diabetes with neuropathy CHF Due to bladder management, bowel management, safety, skin/wound care, disease management, medication administration, pain management, and patient education, does the patient require 24 hr/day rehab nursing? Yes Does the patient require coordinated care of a physician, rehab nurse, therapy disciplines of PT, OT to address physical and functional deficits in the context of the above medical diagnosis(es)? Yes Addressing deficits in the following areas: balance, endurance, locomotion, strength, transferring, bowel/bladder control, bathing, and dressing Can the patient actively participate in an intensive therapy program of at least 3 hrs of therapy per day at least 5 days per week? Yes The potential for patient to make measurable gains while on inpatient rehab is excellent Anticipated functional outcomes upon discharge from inpatient rehab are supervision  with PT, supervision with OT, independent with SLP. Estimated rehab length of stay to reach the above functional goals is: 18-21 days Anticipated discharge destination: Home Overall Rehab/Functional Prognosis: excellent  RECOMMENDATIONS: This patient's condition is appropriate for continued rehabilitative care in the following setting: CIR Patient has agreed to participate in recommended program. Yes Note that insurance prior authorization may be required for reimbursement for recommended care.  Discussed with Dr. Laurel Dimmer that we will hopefully have a bed for him on Monday or Tuesday of next week. Ordered kpad for back pain, voltaren gel for knee pain, and high dose vitamin D supplementation.   Izora Ribas, MD 04/04/2022

## 2022-04-04 NOTE — Progress Notes (Signed)
OT Cancellation Note  Patient Details Name: Samuel Chavez MRN: [id number] DOB: [birth year]   Cancelled Treatment:    Reason Eval/Treat Not Completed: Patient declined, no reason specified. Chart reviewed. Upon arrival pt visiting with family including newborn granddaughter - requesting to enjoy visit with family. Will defer and see at later date.   Dessie Coma, M.S. OTR/L  04/04/22, 3:38 PM  ascom [phone]

## 2022-04-04 NOTE — TOC Progression Note (Addendum)
Transition of Care [facility]) - Progression Note    Patient Details  Name: Samuel Chavez MRN: [id number] Date of Birth: 1976/02/06  Transition of Care [facility]) CM/SW [place], LCSW Phone Number: 04/04/2022, 9:53 AM  Clinical Narrative:    Per CIR, auth approved-  no bed until Monday or Tuesday.   Expected Discharge Plan:  (TBD) Barriers to Discharge: Continued Medical Work up  Expected Discharge Plan and Services Expected Discharge Plan:  (TBD)   Discharge Planning Services: CM Consult   Living arrangements for the past 2 months: Single Family Home                                       Social Determinants of Health (SDOH) Interventions    Readmission Risk Interventions     No data to display

## 2022-04-04 NOTE — Progress Notes (Signed)
Inpatient Rehabilitation Admissions Coordinator    I have received insurance approval for [facility] CIR admit but no bed available before Monday or Tuesday. I have alerted acute team and TOC. Dr Ranell Patrick, Rehab MD, to see patient onsite for assessment.  Danne Baxter, RN, MSN Rehab Admissions Coordinator [phone] 04/04/2022 10:32 AM

## 2022-04-04 NOTE — PMR Pre-admission (Signed)
PMR Admission Coordinator Pre-Admission Assessment  Patient: Samuel Chavez is an 46 y.o., male MRN: [id number] DOB: [birth year] Height: 5\' 9"  (175.3 cm) Weight: (!) 167.8 kg              Insurance Information HMO:     PPO:      PCP:      IPA:      80/20:      OTHER:  PRIMARY: [place] Medicaid Healthy Blue      Policy#: [id number]      Subscriber: pt CM Name: Olegario Shearer      Phone#: [phone]     Fax#: [phone] Pre-Cert#: [id number] approved for 7 days from admit      Employer:  Benefits:  Phone #: [phone]     Name: 8/14 Eff. Date: 01/19/20     Deduct: none      Out of Pocket Max: none      Life Max: none  CIR: 100% per medicaid guidelines      SNF: 100% Outpatient: $4 per visit     Co-Pay: per medical neccesity Home Health: 100%      Co-Pay: per medical neccesity DME: 100%     Co-Pay: per medical neccesity Providers: in network  SECONDARY: none  Financial Counselor:       Phone#:   [employer]" for patients in Inpatient Rehabilitation Facilities with attached "Privacy Act [place] Records" was provided and verbally reviewed with: N/A  Emergency Contact Information Contact Information     Name Relation Home Work Mobile   [place] Daughter   [phone]   Rochel Brome   [place] Daughter   [phone]      Current Medical History  Patient Admitting Diagnosis: Critical Illness Myopathy  History of Present Illness: 46 year old male with history of type 2 DM, HTN, pancreatitis, tobacco abuse, systolic CHF, OSA,diabetic neuropathy, lumbar radiculopathy and morbid obesity . Presented on 02/11/22 with acute onset of worsening LE edema with associated dyspnea and orthopnea. He felt he had gained over 30 lbs from his dry weight. Placed on BiPAP in ED initially due to respiratory distress and significant lethargy.  He later decompensated and was intubated. Eventual trach placed. He has been downsized per ENT  and using PMSV and on 28 % trach collar. No plans for decannulation prior to discharge home. Treated for MRSA HCAP and completed course with  linezolid. Prevotella sinusitis completed Unasyn and Augmentin per ID recommendations. DM poorly controlled. Continue on Levemir, SSI with CBGS. Initially with tube feeds, but now transitioned to diet. Gastric tube remains and surgery recommends to remove once the track has matured.  Encephalopathy has resolved. Felt developed critical illness myopathy and need extensive PT, OT and SLP prior to discharge home. LLE with history of sciatica. LLE [place] negative for DVT. Continue on gabapentin and flexeril. BP trending up on 9/16/ BNP checked and found to be mildy elevated. Patient given one dose of Lasix and BP has since improved.   Glasgow Coma Scale Score: 15  Past Medical History  Past Medical History:  Diagnosis Date   Diabetes mellitus without complication ([place])    Hypertension    Pancreatitis    Has the patient had major surgery during 100 days prior to admission? Yes  Family History  family history is not on file.   Current Medications   Current Facility-Administered Medications:    acetaminophen (TYLENOL) tablet 650 mg, 650 mg, Oral, Q6H PRN, 650 mg at 04/02/22  2338 **OR** acetaminophen (TYLENOL) suppository 650 mg, 650 mg, Rectal, Q6H PRN, Sakai, Isami, DO   apixaban (ELIQUIS) tablet 5 mg, 5 mg, Oral, BID, Delena Bali, RPH, 5 mg at 04/07/22 1003   camphor-menthol (SARNA) lotion, , Topical, PRN, Sharion Settler, NP, Given at 04/02/22 0854   carbamide peroxide (DEBROX) 6.5 % OTIC (EAR) solution 5 drop, 5 drop, Left EAR, BID, Jimmye Norman, Jamiese M, MD, 5 drop at 04/07/22 1008   carvedilol (COREG) tablet 6.25 mg, 6.25 mg, Oral, BID WC, Delena Bali, RPH, 6.25 mg at 04/07/22 1004   Chlorhexidine Gluconate Cloth 2 % PADS 6 each, 6 each, Topical, Q0600, Sakai, Isami, DO, 6 each at 04/06/22 0806   cholecalciferol (VITAMIN D3) 25 MCG (1000  UNIT) tablet 2,000 Units, 2,000 Units, Oral, Daily, Annita Brod, MD, 2,000 Units at 04/07/22 1003   cyclobenzaprine (FLEXERIL) tablet 10 mg, 10 mg, Oral, BID PRN, Delena Bali, RPH, 10 mg at 04/02/22 0446   diclofenac Sodium (VOLTAREN) 1 % topical gel 2 g, 2 g, Topical, QID, Raulkar, Clide Deutscher, MD, 2 g at 04/06/22 1219   diphenhydrAMINE (BENADRYL) capsule 25 mg, 25 mg, Oral, Q6H PRN, Wyvonnia Dusky, MD, 25 mg at 04/06/22 2131   docusate sodium (COLACE) capsule 100 mg, 100 mg, Oral, BID, Amery, Sahar, MD, 100 mg at 04/05/22 2211   escitalopram (LEXAPRO) tablet 10 mg, 10 mg, Oral, Daily, Delena Bali, RPH, 10 mg at 04/07/22 1003   free water 30 mL, 30 mL, Per Tube, BID, Wyvonnia Dusky, MD, 30 mL at 04/06/22 0807   gabapentin (NEURONTIN) tablet 600 mg, 600 mg, Oral, BID, Delena Bali, RPH, 600 mg at 04/07/22 1003   insulin aspart (novoLOG) injection 0-20 Units, 0-20 Units, Subcutaneous, TID AC, Foust, Katy L, NP, 3 Units at 04/06/22 1619   insulin detemir (LEVEMIR) injection 22 Units, 22 Units, Subcutaneous, BID, Sakai, Isami, DO, 22 Units at 04/07/22 1021   ipratropium-albuterol (DUONEB) 0.5-2.5 (3) MG/3ML nebulizer solution 3 mL, 3 mL, Nebulization, Q6H PRN, Wyvonnia Dusky, MD, 3 mL at 04/04/22 2119   lactulose (CHRONULAC) 10 GM/15ML solution 20 g, 20 g, Oral, BID PRN, Wyvonnia Dusky, MD, 20 g at 03/28/22 1619   lidocaine (LIDODERM) 5 % 1 patch, 1 patch, Transdermal, Q24H, Darel Hong D, NP, 1 patch at 04/06/22 2123   metoprolol tartrate (LOPRESSOR) injection 5 mg, 5 mg, Intravenous, Q6H PRN, Annita Brod, MD, 5 mg at 04/05/22 0845   multivitamin with minerals tablet 1 tablet, 1 tablet, Oral, Daily, Nolberto Hanlon, MD, 1 tablet at 04/07/22 1005   mupirocin ointment (BACTROBAN) 2 %, , Nasal, BID, Sakai, Isami, DO, Given at 04/07/22 1005   nutrition supplement (JUVEN) (JUVEN) powder packet 1 packet, 1 packet, Oral, BID BM, Wyvonnia Dusky, MD,  1 packet at 04/06/22 1219   Oral care mouth rinse, 15 mL, Mouth Rinse, PRN, Darel Hong D, NP   oxyCODONE (Oxy IR/ROXICODONE) immediate release tablet 10 mg, 10 mg, Oral, Q4H PRN, Wyvonnia Dusky, MD, 10 mg at 04/07/22 0207   pantoprazole (PROTONIX) EC tablet 40 mg, 40 mg, Oral, Daily, Delena Bali, RPH, 40 mg at 04/07/22 1003   polyethylene glycol (MIRALAX / GLYCOLAX) packet 17 g, 17 g, Oral, Daily, Kurtis Bushman, Sahar, MD, 17 g at 04/05/22 0836   protein supplement (ENSURE MAX) liquid, 11 oz, Oral, TID, Annita Brod, MD, 11 oz at 04/06/22 0808   Vitamin D (Ergocalciferol) (DRISDOL) 1.25 MG (50000 UNIT) capsule 50,000  Units, 50,000 Units, Oral, Q7 days, Raulkar, Clide Deutscher, MD, 50,000 Units at 04/05/22 0847  Patients Current Diet:  Diet Order             Diet regular Room service appropriate? Yes with Assist; Fluid consistency: Thin  Diet effective now                  Precautions / Restrictions Precautions Precautions: Fall Precaution Comments: trach with PMV (5 L O2 / 20%) Restrictions Weight Bearing Restrictions: No Other Position/Activity Restrictions: HOB 30 deg- trach   Has the patient had 2 or more falls or a fall with injury in the past year?No  Prior Activity Level Limited Community (1-2x/wk): Independent without AD  Prior Functional Level Prior Function Prior Level of Function : Independent/Modified Independent Mobility Comments: pt reports ambulatory without AD community distances - communicated via head nods/shakes however mildly delayed responses ADLs Comments: independent  Self Care: Did the patient need help bathing, dressing, using the toilet or eating?  Independent  Indoor Mobility: Did the patient need assistance with walking from room to room (with or without device)? Independent  Stairs: Did the patient need assistance with internal or external stairs (with or without device)? Independent  Functional Cognition: Did the patient need help  planning regular tasks such as shopping or remembering to take medications? Independent  Patient Information Are you of Hispanic, Latino/a,or Spanish origin?: A. No, not of Hispanic, Latino/a, or Spanish origin What is your race?: B. Black or African American Do you need or want an interpreter to communicate with a doctor or health care staff?: 0. No  Patient's Response To:  Health Literacy and Transportation Is the patient able to respond to health literacy and transportation needs?: Yes Health Literacy - How often do you need to have someone help you when you read instructions, pamphlets, or other written material from your doctor or pharmacy?: Never In the past 12 months, has lack of transportation kept you from medical appointments or from getting medications?: No In the past 12 months, has lack of transportation kept you from meetings, work, or from getting things needed for daily living?: No  Development worker, international aid / [place] Devices/Equipment: None  Prior Device Use: Indicate devices/aids used by the patient prior to current illness, exacerbation or injury? None of the above  Current Functional Level Cognition  Overall Cognitive Status: Within Functional Limits for tasks assessed Difficult to assess due to: Tracheostomy Orientation Level: Oriented X4 General Comments: pt was A and O x 4. Agreeable and cooperative throughout. Is motivated to improve independence    Extremity Assessment (includes Sensation/Coordination)  Upper Extremity Assessment: LUE deficits/detail, RUE deficits/detail RUE Deficits / Details: 3-/5 grip. 3/5 wrist flexion/extension. 2/5 elbow flexion/extension LUE Deficits / Details: 3-/5 grip. 3+/5 wrist flexion/extension. 2+/5 elbow flexion/extension  Lower Extremity Assessment: RLE deficits/detail, LLE deficits/detail RLE Deficits / Details: 2-/5 grossly    ADLs  Overall ADL's : Needs assistance/impaired General ADL Comments: MAX A don B  socks in sitting. SUPERVISION seated grooming tasks.    Mobility  Overal bed mobility: Needs Assistance Bed Mobility: Rolling, Sidelying to Sit, Sit to Sidelying, Sit to Supine Rolling: Supervision (Increased time to roll L with use of bedrails) Sidelying to sit: Min assist, Mod assist Supine to sit: Min assist, Mod assist Sit to supine: Mod assist, Max assist Sit to sidelying: Mod assist, Max assist General bed mobility comments: pt was able to exit L side of bed with ioncrased time + min-mod  assist of one. heavy use of bedrail. after standing trials, pt required more assistance to return to supine. mod-max of one. Pt was able to reposition to [facility] with min assist only after sliding down to FOB.    Transfers  Overall transfer level: Needs assistance Equipment used: Rolling walker (2 wheels) (Bariatric) Transfers: Sit to/from Stand Sit to Stand: Min assist, Mod assist, From elevated surface, +2 safety/equipment Bed to/from chair/wheelchair/BSC transfer type:: Stand pivot Stand pivot transfers: Mod assist, +2 physical assistance, From elevated surface, +2 safety/equipment Transfer via Lift Equipment:  (hoyer) General transfer comment: pt stood 4 x EOB (elevated) to bariatric RW. he stood ~ 10 sec each of first 3 attempts with bed height slightly lowered each attempt. Final attempt pt stood for 20 sec. Did attempt on several transfers to progress to clearing LEs from floor however pt unable. He puts forth great effort and is progressing quickly.    Ambulation / Gait / Stairs / Wheelchair Mobility  Ambulation/Gait General Gait Details: unable to progress from EOB due to strength deficits and safety    Posture / Balance Dynamic Sitting Balance Sitting balance - Comments: supervision while sitting EOB. Balance Overall balance assessment: Needs assistance Sitting-balance support: No upper extremity supported, Feet supported Sitting balance-Leahy Scale: Fair Sitting balance - Comments:  supervision while sitting EOB. Postural control: Right lateral lean Standing balance support: Bilateral upper extremity supported Standing balance-Leahy Scale: Poor Standing balance comment: Very limited standing endurance however is able to maintain standing balance with BUE support. Unable to stand without UE support.    Special needs/care consideration New trach this admit with plans to discharge home with trach. 9/13 Shiley XLT uncuffed #6 16 FR G tube placed 03/14/22. Not in use now   Previous Home Environment  Living Arrangements:  (Lives with daughter, [name] and other children of his; he has 13 kids total)  Lives With: Family Available Help at Discharge: Family, Available 24 hours/day Type of Home: House Home Layout: One level Home Access: Stairs to enter Entrance Stairs-Rails: None Entrance Stairs-Number of Steps: 3 to 4 Bathroom Shower/Tub: Chiropodist: Standard Bathroom Accessibility: Yes How Accessible: Accessible via walker [place]: No Additional Comments: PLOF and home layout clarified wiht patient and family on 9/13  Discharge Living Setting Plans for Discharge Living Setting: House, Lives with (comment) (daughter, [name]) Type of Home at Discharge: House Discharge Home Layout: One level Discharge Home Access: Stairs to enter Entrance Stairs-Rails: None Entrance Stairs-Number of Steps: 3 to 4 Discharge Bathroom Shower/Tub: Tub/shower unit Discharge Bathroom Toilet: Standard Discharge Bathroom Accessibility: Yes How Accessible: Accessible via walker Does the patient have any problems obtaining your medications?: No  Daughter states they are moving in a few weeks from now to new rental but that house is not ready. She states it will be more that 3 weeks form no when clarified on 9/18  Social/Family/Support Systems Patient Roles: Parent Contact Information: daughter, [name] Anticipated Caregiver: daughter adn children ( ages  30 to 58 years old). Not all in the home Anticipated Caregiver's Contact Information: see contacts Ability/Limitations of Caregiver: Daughter works days, but other family avaialble when she is at work Building control surveyor Availability: 24/7 Discharge Plan Discussed with Primary Caregiver: Yes Is Caregiver In Agreement with Plan?: Yes Does Caregiver/Family have Issues with Lodging/Transportation while Pt is in Rehab?: No  Goals Patient/Family Goal for Rehab: supervision PT, supervision to min OT, supervision with SLP Expected length of stay: ELOS 14 to 20 days Additional Information: Pulmonary plans  for him to go home with trach. Daughter needs continued education that was begun at [facility] with respiratory Pt/Family Agrees to Admission and willing to participate: Yes Program Orientation Provided & Reviewed with Pt/Caregiver Including Roles  & Responsibilities: Yes  Decrease burden of Care through IP rehab admission: n/a  Possible need for SNF placement upon discharge:not anticipated  Patient Condition: This patient's medical and functional status remains the same since the consult dated: 04/04/22 in which the Rehabilitation Physician determined and documented that the patient's condition is appropriate for intensive rehabilitative care in an inpatient rehabilitation facility. See "History of Present Illness" (above) for medical update. Functional : overall mod to max assist. Patient's medical and functional status update has been discussed with the Rehabilitation physician and patient remains appropriate for inpatient rehabilitation. Will admit to inpatient rehab today.  Preadmission Screen Completed By:  Cleatrice Burke, RN, 04/07/2022 10:52 AM ______________________________________________________________________   Discussed status with Dr. Letta Pate on 04/07/22 at 1015 and received approval for admission today.  Admission Coordinator:  Cleatrice Burke, time 0254  Date 04/07/22

## 2022-04-04 NOTE — Progress Notes (Signed)
PT Cancellation Note  Patient Details Name: Samuel Chavez MRN: [id number] DOB: 17-Jul-1976   Cancelled Treatment:     PT attempt. Pt currently c/o severe stomach pain. Requested author return at a later time. PT will continue to follow per current POC.    Willette Pa 04/04/2022, 1:17 PM

## 2022-04-04 NOTE — Progress Notes (Signed)
PROGRESS NOTE    46 y.o. 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  Patient states he has been taking his medication without improvement and reports 30 pound weight gain.  Patient found to be significantly hypoxic and hypercarbic and placed on BiPAP.  Following admission, he decompensated and was intubated.   Pt has been medically stable for d/c but no LTAC has taken the pt so far.  However, patient does become more stabilized, but remains deconditioned.  Seen by PT and OT recommended inpatient rehab and he was accepted and approved, now waiting for bed.   Samuel Chavez  QVZ:[id number] DOB: 10-Jul-1976 DOA: 02/11/2022 [facility]: [facility]  Assessment & Plan:   Principal Problem:   Acute respiratory failure with hypoxia and hypercarbia ([place]) Active Problems:   Acute on chronic systolic CHF (congestive heart failure) (HCC)   Hypertensive urgency   Type 2 diabetes mellitus with peripheral neuropathy (HCC)   Acute heart failure with preserved ejection fraction (HCC)   Renal failure (ARF), acute on chronic (HCC)   Hospital-acquired pneumonia   ARDS (adult respiratory distress syndrome) (HCC)   Pressure injury of skin   Constipation  Assessment and Plan: Acute on chronic hypoxemic and hypercapneic respiratory failure: s/p tracheostomy. Hx of OSA/OHS. Continue w/ trach collar & supplemental oxygen.  Seen by general surgery and underwent fiberoptic tracheoscopy to confirm proper placement of tube.   MRSA HCAP: completed linezolid course.  Provetella sinusitis & has completed unasyn/augmentin. ID signed off  Muffled hearing: of left eye. Secondary to wax build up. S/p ear flushing but  still w/ c/o muffled hearing. Continue w/ ear wax drops   AKI: resolved   Hypernatremia: WNL. Continue w/ free water flushes  Constipation: lactulose prn   Hyperkalemia: resolved  Hypomagnesemia: Resolved   DM2: likely poorly controlled. Continue on levemir, SSI w/ accuchecks. Tube feeds have been d/c and pt is eating by mouth. Per surgery, gastric tube sutures taken off & do NOT recommend removing the tube yet since the track hasnt matured yet.  Give it another 2 weeks before considering removal as per gen surg    Normocytic anemia:  H&H are labile. 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 EEG 8/16 neg. Needs rehab, see PT/OT notes & CM's notes.  PICC associated DVT: PICC removed. Continue on eliquis   Depression: severity unknown. Continue on lexapro   Left leg pain: w/ hx of sciatica as per pt.  LLE [place] was neg for DVT. Continue on gabapentin, flexeril    Morbidly obese: BMI 57.8. Complicates overall care & prognosis      DVT prophylaxis: eliquis  Code Status: full  Family Communication: Left message for family Disposition Plan: Inpatient rehab when bed available, possibly Monday 9/18  Level of care: Med-Surg  Status is: Inpatient Remains inpatient appropriate because: Awaiting bed for CIR   Consultants:  ICU Gen surg  Nephro  ID  Cardio   Procedures:   Antimicrobials:  Completed antibiotic course  Subjective: No complaints, breathing okay  Objective: Vitals:   04/04/22 0259 04/04/22 0750 04/04/22 1221 04/04/22 1549  BP: 124/72 (!) 152/80  (!) 168/103  Pulse: 89 88  88  Resp: 20 18  18  Temp: 98.8 F (37.1 C) 98.5 F (36.9 C)  98.8 F (37.1 C)  TempSrc: Oral Oral    SpO2: 95% 100% 100% 93%  Weight:      Height:        Intake/Output Summary (Last 24 hours) at 04/04/2022 1755 Last data filed at 04/04/2022 1249 Gross per 24 hour  Intake --   Output 3100 ml  Net -3100 ml    Filed Weights   03/24/22 1508 03/24/22 1714 03/28/22 0500  Weight: (!) 182.8 kg (!) 191 kg (!) 177.8 kg    Examination:  General exam: Alert and oriented x3, no acute distress Respiratory system: diminished breath sounds b/l secondary to body habitus. Trach in place Cardiovascular system: Regular rate and rhythm, S1-S2 Gastrointestinal system: Soft, nontender, obese, positive bowel sounds Psychiatry: Appropriate, no evidence of psychoses No change from previous day   Data Reviewed: Electrolytes and kidney function unremarkable 9/15  CBC: Recent Labs  Lab 03/29/22 0441 03/30/22 0710 03/31/22 0540 04/01/22 0848 04/02/22 0434  WBC 7.0 7.3 8.2 8.2 9.0  HGB 8.5* 8.5* 8.7* 9.2* 8.6*  HCT 29.8* 29.6* 30.1* 32.4* 29.8*  MCV 94.0 93.7 95.0 93.9 94.6  PLT 253 265 253 264 712    Basic Metabolic Panel: Recent Labs  Lab 03/29/22 0441 03/30/22 0710 03/31/22 0540 04/01/22 0429 04/01/22 0848 04/02/22 0434 04/04/22 0400  NA 139 142 138  --  138 135 139  K 4.1 4.2 4.0  --  4.5 4.3 3.8  CL 98 100 98  --  97* 97* 100  CO2 33* 35* 34*  --  33* 33* 30  GLUCOSE 117* 103* 149*  --  126* 117* 137*  BUN 10 7 11   --  9 12 8   CREATININE 0.59* 0.56* 0.65  --  0.58* 0.65 0.67  CALCIUM 8.5* 8.5* 8.5*  --  8.7* 8.4* 8.3*  MG 1.7 1.6* 1.7 1.7  --  1.6*  --     GFR: Estimated Creatinine Clearance: 189.2 mL/min (by C-G formula based on SCr of 0.67 mg/dL). Liver Function Tests: No results for input(s): "AST", "ALT", "ALKPHOS", "BILITOT", "PROT", "ALBUMIN" in the last 168 hours.  No results for input(s): "LIPASE", "AMYLASE" in the last 168 hours. No results for input(s): "AMMONIA" in the last 168 hours. Coagulation Profile: No results for input(s): "INR", "PROTIME" in the last 168 hours. Cardiac Enzymes: No results for input(s): "CKTOTAL", "CKMB", "CKMBINDEX", "TROPONINI" in the last 168 hours. BNP (last 3 results) No results for input(s): "PROBNP" in  the last 8760 hours. HbA1C: No results for input(s): "HGBA1C" in the last 72 hours. CBG: Recent Labs  Lab 04/03/22 1723 04/03/22 1958 04/04/22 0747 04/04/22 1121 04/04/22 1628  GLUCAP 108* 201* 133* 195* 146*    Lipid Profile: No results for input(s): "CHOL", "HDL", "LDLCALC", "TRIG", "CHOLHDL", "LDLDIRECT" in the last 72 hours. Thyroid Function Tests: No results for input(s): "TSH", "T4TOTAL", "FREET4", "T3FREE", "THYROIDAB" in the last 72 hours. Anemia Panel: No results for input(s): "VITAMINB12", "FOLATE", "FERRITIN", "TIBC", "IRON", "RETICCTPCT" in the last 72 hours. Sepsis Labs: No results for input(s): "PROCALCITON", "LATICACIDVEN" in the last 168 hours.  No results found for this or any previous visit (from the past 240 hour(s)).       Radiology Studies: No results found.      Scheduled Meds:  apixaban  5 mg Oral BID   carbamide peroxide  5 drop Left EAR BID   carvedilol  6.25 mg Oral BID WC   Chlorhexidine Gluconate Cloth  6 each Topical Q0600   cholecalciferol  2,000 Units Oral Daily   diclofenac Sodium  2 g Topical QID   docusate sodium  100 mg Oral BID   escitalopram  10 mg Oral Daily   free water  30 mL Per Tube BID   gabapentin  600 mg Oral BID   insulin aspart  0-20 Units Subcutaneous TID AC   insulin detemir  22 Units Subcutaneous BID   lidocaine  1 patch Transdermal Q24H   multivitamin with minerals  1 tablet Oral Daily   mupirocin ointment   Nasal BID   nutrition supplement (JUVEN)  1 packet Oral BID BM   pantoprazole  40 mg Oral Daily   polyethylene glycol  17 g Oral Daily   Ensure Max Protein  11 oz Oral TID   [START ON 04/05/2022] Vitamin D (Ergocalciferol)  50,000 Units Oral Q7 days   Continuous Infusions:  sodium chloride 10 mL/hr at 03/14/22 1036     LOS: 52 days     Annita Brod, MD [facility] Pager [phone]  If 7PM-7AM, please contact night-coverage [email or web address] 04/04/2022, 5:55 PM

## 2022-04-04 NOTE — Progress Notes (Signed)
Per pt request

## 2022-04-05 DIAGNOSIS — J9601 Acute respiratory failure with hypoxia: Secondary | ICD-10-CM | POA: Diagnosis not present

## 2022-04-05 DIAGNOSIS — I160 Hypertensive urgency: Secondary | ICD-10-CM | POA: Diagnosis not present

## 2022-04-05 DIAGNOSIS — I5023 Acute on chronic systolic (congestive) heart failure: Secondary | ICD-10-CM | POA: Diagnosis not present

## 2022-04-05 DIAGNOSIS — J80 Acute respiratory distress syndrome: Secondary | ICD-10-CM

## 2022-04-05 LAB — GLUCOSE, CAPILLARY
Glucose-Capillary: 127 mg/dL — ABNORMAL HIGH (ref 70–99)
Glucose-Capillary: 145 mg/dL — ABNORMAL HIGH (ref 70–99)
Glucose-Capillary: 160 mg/dL — ABNORMAL HIGH (ref 70–99)
Glucose-Capillary: 153 mg/dL — ABNORMAL HIGH (ref 70–99)

## 2022-04-05 LAB — BRAIN NATRIURETIC PEPTIDE: B Natriuretic Peptide: 134.8 pg/mL — ABNORMAL HIGH (ref 0.0–100.0)

## 2022-04-05 LAB — MAGNESIUM: Magnesium: 1.5 mg/dL — ABNORMAL LOW (ref 1.7–2.4)

## 2022-04-05 MED ORDER — FUROSEMIDE 10 MG/ML IJ SOLN
20.0000 mg | Freq: Once | INTRAMUSCULAR | Status: AC
  Administered 2022-04-05: 20 mg via INTRAVENOUS
  Filled 2022-04-05: qty 4

## 2022-04-05 MED ORDER — METOPROLOL TARTRATE 5 MG/5ML IV SOLN
5.0000 mg | Freq: Four times a day (QID) | INTRAVENOUS | Status: DC | PRN
  Administered 2022-04-05: 5 mg via INTRAVENOUS
  Filled 2022-04-05: qty 5

## 2022-04-05 MED ORDER — MAGNESIUM SULFATE 2 GM/50ML IV SOLN
2.0000 g | Freq: Once | INTRAVENOUS | Status: AC
  Administered 2022-04-05: 2 g via INTRAVENOUS
  Filled 2022-04-05: qty 50

## 2022-04-05 NOTE — Plan of Care (Signed)
  Problem: Education: Goal: Ability to demonstrate management of disease process will improve Outcome: Progressing Goal: Ability to verbalize understanding of medication therapies will improve Outcome: Progressing   Problem: Activity: Goal: Capacity to carry out activities will improve Outcome: Progressing   Problem: Cardiac: Goal: Ability to achieve and maintain adequate cardiopulmonary perfusion will improve Outcome: Progressing   Problem: Education: Goal: Knowledge of General Education information will improve Description: Including pain rating scale, medication(s)/side effects and non-pharmacologic comfort measures Outcome: Progressing   Problem: Health Behavior/Discharge Planning: Goal: Ability to manage health-related needs will improve Outcome: Progressing   Problem: Clinical Measurements: Goal: Ability to maintain clinical measurements within normal limits will improve Outcome: Progressing Goal: Will remain free from infection Outcome: Progressing Goal: Diagnostic test results will improve Outcome: Progressing Goal: Respiratory complications will improve Outcome: Progressing Goal: Cardiovascular complication will be avoided Outcome: Progressing   Problem: Activity: Goal: Risk for activity intolerance will decrease Outcome: Progressing   Problem: Nutrition: Goal: Adequate nutrition will be maintained Outcome: Progressing   Problem: Elimination: Goal: Will not experience complications related to bowel motility Outcome: Progressing Goal: Will not experience complications related to urinary retention Outcome: Progressing   Problem: Pain Managment: Goal: General experience of comfort will improve Outcome: Progressing   Problem: Safety: Goal: Ability to remain free from injury will improve Outcome: Progressing   Problem: Skin Integrity: Goal: Risk for impaired skin integrity will decrease Outcome: Progressing   Problem: Coping: Goal: Ability to adjust  to condition or change in health will improve Outcome: Progressing   Problem: Fluid Volume: Goal: Ability to maintain a balanced intake and output will improve Outcome: Progressing   Problem: Health Behavior/Discharge Planning: Goal: Ability to identify and utilize available resources and services will improve Outcome: Progressing Goal: Ability to manage health-related needs will improve Outcome: Progressing   Problem: Metabolic: Goal: Ability to maintain appropriate glucose levels will improve Outcome: Progressing   Problem: Nutritional: Goal: Maintenance of adequate nutrition will improve Outcome: Progressing Goal: Progress toward achieving an optimal weight will improve Outcome: Progressing   Problem: Skin Integrity: Goal: Risk for impaired skin integrity will decrease Outcome: Progressing   Problem: Tissue Perfusion: Goal: Adequacy of tissue perfusion will improve Outcome: Progressing   Problem: Activity: Goal: Ability to tolerate increased activity will improve Outcome: Progressing   Problem: Respiratory: Goal: Ability to maintain a clear airway and adequate ventilation will improve Outcome: Progressing   Problem: Role Relationship: Goal: Method of communication will improve Outcome: Progressing

## 2022-04-05 NOTE — Progress Notes (Signed)
PROGRESS NOTE    46 y.o. 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  Patient states he has been taking his medication without improvement and reports 30 pound weight gain.  Patient found to be significantly hypoxic and hypercarbic and placed on BiPAP.  Following admission, he decompensated and was intubated.   Pt has been medically stable for d/c but no LTAC has taken the pt so far.  However, patient does become more stabilized, but remains deconditioned.  Seen by PT and OT recommended inpatient rehab and he was accepted and approved, now waiting for bed.   CHIVAS NOTZ  GDJ:[id number] DOB: 11-10-1975 DOA: 02/11/2022 [facility]: [facility]  Assessment & Plan:   Principal Problem:   Acute respiratory failure with hypoxia and hypercarbia ([place]) Active Problems:   Acute on chronic systolic CHF (congestive heart failure) (HCC)   Hypertensive urgency   Type 2 diabetes mellitus with peripheral neuropathy (HCC)   Acute heart failure with preserved ejection fraction (HCC)   Renal failure (ARF), acute on chronic (HCC)   Hospital-acquired pneumonia   ARDS (adult respiratory distress syndrome) (HCC)   Pressure injury of skin   Constipation  Assessment and Plan: Acute on chronic hypoxemic and hypercapneic respiratory failure: s/p tracheostomy. Hx of OSA/OHS. Continue w/ trach collar & supplemental oxygen.  Seen by general surgery and underwent fiberoptic tracheoscopy to confirm proper placement of tube.   MRSA HCAP: completed linezolid course.  Provetella sinusitis & has completed unasyn/augmentin. ID signed off  Muffled hearing: of left eye. Secondary to wax build up. S/p ear flushing but  still w/ c/o muffled hearing. Continue w/ ear wax drops   AKI: resolved   Hypernatremia: WNL. Continue w/ free water flushes  Constipation: lactulose prn   Hyperkalemia: resolved  Hypomagnesemia: Resolved   DM2: likely poorly controlled. Continue on levemir, SSI w/ accuchecks. Tube feeds have been d/c and pt is eating by mouth. Per surgery, gastric tube sutures taken off & do NOT recommend removing the tube yet since the track hasnt matured yet.  Give it another 2 weeks before considering removal as per gen surg    Normocytic anemia:  H&H are labile. 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 EEG 8/16 neg. Needs rehab, see PT/OT notes & CM's notes.  PICC associated DVT: PICC removed. Continue on eliquis   Depression: severity unknown. Continue on lexapro   Left leg pain: w/ hx of sciatica as per pt.  LLE [place] was neg for DVT. Continue on gabapentin, flexeril    Morbidly obese: BMI 57.8. Complicates overall care & prognosis   Acute on chronic systolic heart failure.  Blood pressure trending up on 6/22 with systolic in the 297L to 892J.  BNP checked and found to be mildly elevated.  Patient given 1 dose of Lasix and blood pressure has since improved.   DVT prophylaxis: eliquis  Code Status: full  Family Communication: Left message for family Disposition Plan: Inpatient rehab when bed available, possibly Monday 9/18  Level of care: Med-Surg  Status is: Inpatient Remains inpatient appropriate because: Awaiting bed for CIR   Consultants:  ICU Gen surg  Nephro  ID  Cardio   Procedures:   Antimicrobials:  Completed  antibiotic course  Subjective: Patient denies headache or shortness of breath  Objective: Vitals:   04/05/22 0749 04/05/22 0806 04/05/22 1045 04/05/22 1605  BP: (!) 191/102  (!) 154/95 (!) 138/93  Pulse: 91  89 85  Resp: 18   20  Temp:    98.8 F (37.1 C)   TempSrc:    Oral  SpO2: 92% 98%  96%  Weight:      Height:        Intake/Output Summary (Last 24 hours) at 04/05/2022 1616 Last data filed at 04/05/2022 1225 Gross per 24 hour  Intake 480 ml  Output 2600 ml  Net -2120 ml    Filed Weights   03/24/22 1508 03/24/22 1714 03/28/22 0500  Weight: (!) 182.8 kg (!) 191 kg (!) 177.8 kg    Examination:  General exam: Alert and oriented x3, no acute distress Respiratory system: diminished breath sounds b/l secondary to body habitus. Trach in place Cardiovascular system: Regular rate and rhythm, S1-S2 Gastrointestinal system: Soft, nontender, obese, positive bowel sounds Psychiatry: Appropriate, no evidence of psychoses   Data Reviewed: Mildly elevated BNP  CBC: Recent Labs  Lab 03/30/22 0710 03/31/22 0540 04/01/22 0848 04/02/22 0434  WBC 7.3 8.2 8.2 9.0  HGB 8.5* 8.7* 9.2* 8.6*  HCT 29.6* 30.1* 32.4* 29.8*  MCV 93.7 95.0 93.9 94.6  PLT 265 253 264 161    Basic Metabolic Panel: Recent Labs  Lab 03/30/22 0710 03/31/22 0540 04/01/22 0429 04/01/22 0848 04/02/22 0434 04/04/22 0400 04/05/22 0453  NA 142 138  --  138 135 139  --   K 4.2 4.0  --  4.5 4.3 3.8  --   CL 100 98  --  97* 97* 100  --   CO2 35* 34*  --  33* 33* 30  --   GLUCOSE 103* 149*  --  126* 117* 137*  --   BUN 7 11  --  9 12 8   --   CREATININE 0.56* 0.65  --  0.58* 0.65 0.67  --   CALCIUM 8.5* 8.5*  --  8.7* 8.4* 8.3*  --   MG 1.6* 1.7 1.7  --  1.6*  --  1.5*    GFR: Estimated Creatinine Clearance: 189.2 mL/min (by C-G formula based on SCr of 0.67 mg/dL). Liver Function Tests: No results for input(s): "AST", "ALT", "ALKPHOS", "BILITOT", "PROT", "ALBUMIN" in the last 168 hours.  No results for input(s): "LIPASE", "AMYLASE" in the last 168 hours. No results for input(s): "AMMONIA" in the last 168 hours. Coagulation Profile: No results for input(s): "INR", "PROTIME" in the last 168 hours. Cardiac Enzymes: No results for input(s): "CKTOTAL", "CKMB",  "CKMBINDEX", "TROPONINI" in the last 168 hours. BNP (last 3 results) No results for input(s): "PROBNP" in the last 8760 hours. HbA1C: No results for input(s): "HGBA1C" in the last 72 hours. CBG: Recent Labs  Lab 04/04/22 1628 04/04/22 2156 04/05/22 0751 04/05/22 1115 04/05/22 1553  GLUCAP 146* 107* 127* 145* 160*    Lipid Profile: No results for input(s): "CHOL", "HDL", "LDLCALC", "TRIG", "CHOLHDL", "LDLDIRECT" in the last 72 hours. Thyroid Function Tests: No results for input(s): "TSH", "T4TOTAL", "FREET4", "T3FREE", "THYROIDAB" in the last 72 hours. Anemia Panel: No results for input(s): "VITAMINB12", "FOLATE", "FERRITIN", "TIBC", "IRON", "RETICCTPCT" in the last 72 hours. Sepsis Labs: No results for input(s): "PROCALCITON", "LATICACIDVEN" in the last 168 hours.  No results found for this or any previous visit (from the past 240 hour(s)).       Radiology Studies:  No results found.      Scheduled Meds:  apixaban  5 mg Oral BID   carbamide peroxide  5 drop Left EAR BID   carvedilol  6.25 mg Oral BID WC   Chlorhexidine Gluconate Cloth  6 each Topical Q0600   cholecalciferol  2,000 Units Oral Daily   diclofenac Sodium  2 g Topical QID   docusate sodium  100 mg Oral BID   escitalopram  10 mg Oral Daily   free water  30 mL Per Tube BID   gabapentin  600 mg Oral BID   insulin aspart  0-20 Units Subcutaneous TID AC   insulin detemir  22 Units Subcutaneous BID   lidocaine  1 patch Transdermal Q24H   multivitamin with minerals  1 tablet Oral Daily   mupirocin ointment   Nasal BID   nutrition supplement (JUVEN)  1 packet Oral BID BM   pantoprazole  40 mg Oral Daily   polyethylene glycol  17 g Oral Daily   Ensure Max Protein  11 oz Oral TID   Vitamin D (Ergocalciferol)  50,000 Units Oral Q7 days   Continuous Infusions:     LOS: 53 days     Annita Brod, MD [facility] Pager [phone]  If 7PM-7AM, please contact  night-coverage [email or web address] 04/05/2022, 4:16 PM

## 2022-04-05 NOTE — Progress Notes (Signed)
Physical Therapy Treatment Patient Details Name: Samuel Chavez MRN: [id number] DOB: 1975/08/27 Today's Date: 04/05/2022   History of Present Illness Pt is 5 YOM admitted for PNA, renail failure, AKI, hyperkalemia, CHF, Resp failure, sepsis, and is s/p prolonged ventilation, trach placement. PMH includes: DM, HTN, sCHF, OSA, obesity, ARDS Resp failure, sepsis.    PT Comments    Pt was long sitting in bed upon arriving. He is agreeable to session and much more motivated today.On 5 L trach collar at 20 %.  He continues to demonstrate improvements in strength and abilities. Was able to exit L side of bed and stood to bariatric RW 4 x. Stood for ~ 10-20 sec. HR elevated from low 90s to 115 bpm. Sao2 maintain >95% . Pt did attempt to clear LE in standing but unable. Overall pt is progressing towards goals and remains great CIR candidate. PT will continue to follow and progress as able per current POC.    Recommendations for follow up therapy are one component of a multi-disciplinary discharge planning process, led by the attending physician.  Recommendations may be updated based on patient status, additional functional criteria and insurance authorization.  Follow Up Recommendations  Acute inpatient rehab (3hours/day)     Assistance Recommended at Discharge Frequent or constant Supervision/Assistance  Patient can return home with the following Two people to help with walking and/or transfers;Two people to help with bathing/dressing/bathroom;Direct supervision/assist for medications management;Help with stairs or ramp for entrance;Assist for transportation;Assistance with feeding;Assistance with cooking/housework;Direct supervision/assist for financial management   Equipment Recommendations  Other (comment) (defer to next level of care)       Precautions / Restrictions Precautions Precautions: Fall Precaution Comments: trach with PMV (5 L O2 / 20%) Restrictions Weight Bearing Restrictions:  No     Mobility  Bed Mobility Overal bed mobility: Needs Assistance Bed Mobility: Rolling, Sidelying to Sit, Sit to Sidelying, Sit to Supine Rolling: Supervision (Increased time to roll L with use of bedrails) Sidelying to sit: Min assist, Mod assist Supine to sit: Min assist, Mod assist Sit to supine: Mod assist, Max assist Sit to sidelying: Mod assist, Max assist General bed mobility comments: pt was able to exit L side of bed with ioncrased time + min-mod assist of one. heavy use of bedrail. after standing trials, pt required more assistance to return to supine. mod-max of one. Pt was able to reposition to [facility] with min assist only after sliding down to FOB.    Transfers Overall transfer level: Needs assistance Equipment used: Rolling walker (2 wheels) (Bariatric) Transfers: Sit to/from Stand Sit to Stand: Min assist, Mod assist, From elevated surface, +2 safety/equipment      General transfer comment: pt stood 4 x EOB (elevated) to bariatric RW. he stood ~ 10 sec each of first 3 attempts with bed height slightly lowered each attempt. Final attempt pt stood for 20 sec. Did attempt on several transfers to progress to clearing LEs from floor however pt unable. He puts forth great effort and is progressing quickly.    Ambulation/Gait      General Gait Details: unable to progress from EOB due to strength deficits and safety    Balance Overall balance assessment: Needs assistance Sitting-balance support: No upper extremity supported, Feet supported Sitting balance-Leahy Scale: Fair Sitting balance - Comments: supervision while sitting EOB.   Standing balance support: Bilateral upper extremity supported Standing balance-Leahy Scale: Poor       Cognition Arousal/Alertness: Awake/alert Behavior During Therapy: WFL for tasks assessed/performed  Overall Cognitive Status: Within Functional Limits for tasks assessed      General Comments: pt was A and O x 4. Agreeable and  cooperative throughout. Is motivated to improve independence           General Comments General comments (skin integrity, edema, etc.): pt is extremely motivated to progress and eager to progress to ambulation.      Pertinent Vitals/Pain Pain Assessment Pain Assessment: No/denies pain Pain Score: 0-No pain     PT Goals (current goals can now be found in the care plan section) Acute Rehab PT Goals Patient Stated Goal: "get stronger so I can walk and go home" Progress towards PT goals: Progressing toward goals    Frequency    Min 2X/week      PT Plan Current plan remains appropriate    Co-evaluation     PT goals addressed during session: Mobility/safety with mobility;Balance;Proper use of DME;Strengthening/ROM        AM-PAC PT "6 Clicks" Mobility   Outcome Measure  Help needed turning from your back to your side while in a flat bed without using bedrails?: A Little Help needed moving from lying on your back to sitting on the side of a flat bed without using bedrails?: A Little Help needed moving to and from a bed to a chair (including a wheelchair)?: Total Help needed standing up from a chair using your arms (e.g., wheelchair or bedside chair)?: Total Help needed to walk in hospital room?: Total Help needed climbing 3-5 steps with a railing? : Total 6 Click Score: 10    End of Session Equipment Utilized During Treatment:  (5L trach collar 20%) Activity Tolerance: Patient tolerated treatment well Patient left: in bed;with call bell/phone within reach;with bed alarm set Nurse Communication: Mobility status PT Visit Diagnosis: Muscle weakness (generalized) (M62.81);Other abnormalities of gait and mobility (R26.89)     Time: 8299-3716 PT Time Calculation (min) (ACUTE ONLY): 15 min  Charges:  $Therapeutic Activity: 8-22 mins                     Julaine Fusi PTA 04/05/22, 12:44 PM

## 2022-04-05 NOTE — Progress Notes (Signed)
End of shift note: 0700-1900  Pt's BP was very hight this morning; MD was notified and IV metoprolol was given. Pt was started on IV lasix as well. BG was checked and coverage was given. Pt continuous on 5L  through the trach collar.

## 2022-04-06 LAB — GLUCOSE, CAPILLARY
Glucose-Capillary: 131 mg/dL — ABNORMAL HIGH (ref 70–99)
Glucose-Capillary: 134 mg/dL — ABNORMAL HIGH (ref 70–99)
Glucose-Capillary: 131 mg/dL — ABNORMAL HIGH (ref 70–99)
Glucose-Capillary: 123 mg/dL — ABNORMAL HIGH (ref 70–99)

## 2022-04-06 NOTE — Progress Notes (Signed)
PROGRESS NOTE    46 y.o. 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  Patient states he has been taking his medication without improvement and reports 30 pound weight gain.  Patient found to be significantly hypoxic and hypercarbic and placed on BiPAP.  Following admission, he decompensated and was intubated.   Pt has been medically stable for d/c but no LTAC has taken the pt so far.  However, patient does become more stabilized, but remains deconditioned.  Seen by PT and OT recommended inpatient rehab and he was accepted and approved, now waiting for bed, which should be available on 9/18   Samuel Chavez  DVV:[id number] DOB: [birth year] DOA: 02/11/2022 PCP: [facility]:   Principal Problem:   Acute respiratory failure with hypoxia and hypercarbia ([place]) Active Problems:   Acute on chronic systolic CHF (congestive heart failure) ([place])   Hypertensive urgency   Type 2 diabetes mellitus with peripheral neuropathy ([place])   Acute heart failure with preserved ejection fraction ([place])   Renal failure (ARF), acute on chronic (HCC)   Hospital-acquired pneumonia   ARDS (adult respiratory distress syndrome) (HCC)   Pressure injury of skin   Constipation  Assessment and Plan: Acute on chronic hypoxemic and hypercapneic respiratory failure: s/p tracheostomy. Hx of OSA/OHS. Continue w/ trach collar & supplemental oxygen.  Seen by general surgery and underwent fiberoptic tracheoscopy to confirm proper placement of tube.   MRSA HCAP: completed linezolid course.  Provetella sinusitis & has completed unasyn/augmentin. ID signed off  Muffled hearing: of left eye. Secondary to wax  build up. S/p ear flushing but still w/ c/o muffled hearing. Continue w/ ear wax drops   AKI: resolved   Hypernatremia: WNL. Continue w/ free water flushes  Constipation: lactulose prn   Hyperkalemia: resolved  Hypomagnesemia: Resolved   DM2: likely poorly controlled. Continue on levemir, SSI w/ accuchecks. Tube feeds have been d/c and pt is eating by mouth. Per surgery, gastric tube sutures taken off & do NOT recommend removing the tube yet since the track hasnt matured yet.  Give it another 2 weeks before considering removal as per gen surg    Normocytic anemia:  H&H are labile. 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 EEG 8/16 neg. Needs rehab, see PT/OT notes & CM's notes.  PICC associated DVT: PICC removed. Continue on eliquis   Depression: severity unknown. Continue on lexapro   Left leg pain: w/ hx of sciatica as per pt.  LLE [place] was neg for DVT. Continue on gabapentin, flexeril    Morbidly obese: BMI 57.8. Complicates overall care & prognosis   Acute on chronic systolic heart failure.  Blood pressure trending up on 6/26 with systolic in the 948N to 462V.  BNP checked and found to be mildly elevated.  Patient given 1 dose of Lasix and blood pressure has since improved.   DVT prophylaxis: eliquis  Code Status: full  Family Communication: Left message for family Disposition Plan: Inpatient rehab when bed available, possibly Monday 9/18  Level of care: Med-Surg  Status is: Inpatient Remains inpatient appropriate because: Awaiting bed for CIR   Consultants:  ICU Gen surg  Nephro  ID  Cardio  Procedures:   Antimicrobials:  Completed antibiotic course  Subjective: Patient doing okay, no complaints.  Objective: Vitals:   04/05/22 1605 04/05/22 1914 04/06/22 0425 04/06/22 0752  BP: (!) 138/93 (!) 145/86 (!) 150/84 136/74  Pulse: 85 90 86 91  Resp: 20 20 20 18    Temp: 98.8 F (37.1 C) 98.8 F (37.1 C) 98.1 F (36.7 C) 98 F (36.7 C)  TempSrc: Oral Oral Oral Oral  SpO2: 96% 94% 100% 100%  Weight:   (!) 167.8 kg   Height:        Intake/Output Summary (Last 24 hours) at 04/06/2022 1505 Last data filed at 04/06/2022 3009 Gross per 24 hour  Intake --  Output 2200 ml  Net -2200 ml    Filed Weights   03/28/22 0500 04/06/22 0425  Weight: (!) 177.8 kg (!) 167.8 kg    Examination: Sam unchanged from previous day General exam: Alert and oriented x3, no acute distress Respiratory system: diminished breath sounds b/l secondary to body habitus. Trach in place Cardiovascular system: Regular rate and rhythm, S1-S2 Gastrointestinal system: Soft, nontender, obese, positive bowel sounds Psychiatry: Appropriate, no evidence of psychoses   Data Reviewed: No labs today  CBC: Recent Labs  Lab 03/31/22 0540 04/01/22 0848 04/02/22 0434  WBC 8.2 8.2 9.0  HGB 8.7* 9.2* 8.6*  HCT 30.1* 32.4* 29.8*  MCV 95.0 93.9 94.6  PLT 253 264 233    Basic Metabolic Panel: Recent Labs  Lab 03/31/22 0540 04/01/22 0429 04/01/22 0848 04/02/22 0434 04/04/22 0400 04/05/22 0453  NA 138  --  138 135 139  --   K 4.0  --  4.5 4.3 3.8  --   CL 98  --  97* 97* 100  --   CO2 34*  --  33* 33* 30  --   GLUCOSE 149*  --  126* 117* 137*  --   BUN 11  --  9 12 8   --   CREATININE 0.65  --  0.58* 0.65 0.67  --   CALCIUM 8.5*  --  8.7* 8.4* 8.3*  --   MG 1.7 1.7  --  1.6*  --  1.5*    GFR: Estimated Creatinine Clearance: 182.5 mL/min (by C-G formula based on SCr of 0.67 mg/dL). Liver Function Tests: No results for input(s): "AST", "ALT", "ALKPHOS", "BILITOT", "PROT", "ALBUMIN" in the last 168 hours.  No results for input(s): "LIPASE", "AMYLASE" in the last 168 hours. No results for input(s): "AMMONIA" in the last 168 hours. Coagulation Profile: No results for input(s): "INR", "PROTIME" in the last 168 hours. Cardiac Enzymes: No results for input(s):  "CKTOTAL", "CKMB", "CKMBINDEX", "TROPONINI" in the last 168 hours. BNP (last 3 results) No results for input(s): "PROBNP" in the last 8760 hours. HbA1C: No results for input(s): "HGBA1C" in the last 72 hours. CBG: Recent Labs  Lab 04/05/22 1115 04/05/22 1553 04/05/22 2114 04/06/22 0748 04/06/22 1209  GLUCAP 145* 160* 153* 131* 134*    Lipid Profile: No results for input(s): "CHOL", "HDL", "LDLCALC", "TRIG", "CHOLHDL", "LDLDIRECT" in the last 72 hours. Thyroid Function Tests: No results for input(s): "TSH", "T4TOTAL", "FREET4", "T3FREE", "THYROIDAB" in the last 72 hours. Anemia Panel: No results for input(s): "VITAMINB12", "FOLATE", "FERRITIN", "TIBC", "IRON", "RETICCTPCT" in the last 72 hours. Sepsis Labs: No results for input(s): "PROCALCITON", "LATICACIDVEN" in the last 168 hours.  No results found for this or any previous visit (from the past 240 hour(s)).       Radiology Studies: No results found.  Scheduled Meds:  apixaban  5 mg Oral BID   carbamide peroxide  5 drop Left EAR BID   carvedilol  6.25 mg Oral BID WC   Chlorhexidine Gluconate Cloth  6 each Topical Q0600   cholecalciferol  2,000 Units Oral Daily   diclofenac Sodium  2 g Topical QID   docusate sodium  100 mg Oral BID   escitalopram  10 mg Oral Daily   free water  30 mL Per Tube BID   gabapentin  600 mg Oral BID   insulin aspart  0-20 Units Subcutaneous TID AC   insulin detemir  22 Units Subcutaneous BID   lidocaine  1 patch Transdermal Q24H   multivitamin with minerals  1 tablet Oral Daily   mupirocin ointment   Nasal BID   nutrition supplement (JUVEN)  1 packet Oral BID BM   pantoprazole  40 mg Oral Daily   polyethylene glycol  17 g Oral Daily   Ensure Max Protein  11 oz Oral TID   Vitamin D (Ergocalciferol)  50,000 Units Oral Q7 days   Continuous Infusions:     LOS: 54 days     Annita Brod, MD [facility] Pager [phone]  If 7PM-7AM, please contact  night-coverage [email or web address] 04/06/2022, 3:05 PM

## 2022-04-07 ENCOUNTER — Other Ambulatory Visit: Payer: Self-pay

## 2022-04-07 ENCOUNTER — Encounter (HOSPITAL_COMMUNITY): Payer: Self-pay | Admitting: Physical Medicine & Rehabilitation

## 2022-04-07 ENCOUNTER — Inpatient Hospital Stay (HOSPITAL_COMMUNITY)
Admission: RE | Admit: 2022-04-07 | Discharge: 2022-04-29 | DRG: 091 | Disposition: A | Discharge status: Home / Self Care | Payer: Medicaid Other | Source: Other Acute Inpatient Hospital | Attending: Physical Medicine & Rehabilitation | Admitting: Physical Medicine & Rehabilitation

## 2022-04-07 DIAGNOSIS — Z794 Long term (current) use of insulin: Secondary | ICD-10-CM

## 2022-04-07 DIAGNOSIS — G7281 Critical illness myopathy: Secondary | ICD-10-CM | POA: Diagnosis present

## 2022-04-07 DIAGNOSIS — R1013 Epigastric pain: Secondary | ICD-10-CM | POA: Diagnosis present

## 2022-04-07 DIAGNOSIS — D649 Anemia, unspecified: Secondary | ICD-10-CM | POA: Diagnosis present

## 2022-04-07 DIAGNOSIS — E1122 Type 2 diabetes mellitus with diabetic chronic kidney disease: Secondary | ICD-10-CM | POA: Diagnosis present

## 2022-04-07 DIAGNOSIS — J9621 Acute and chronic respiratory failure with hypoxia: Secondary | ICD-10-CM | POA: Diagnosis present

## 2022-04-07 DIAGNOSIS — I110 Hypertensive heart disease with heart failure: Secondary | ICD-10-CM | POA: Diagnosis present

## 2022-04-07 DIAGNOSIS — M7989 Other specified soft tissue disorders: Secondary | ICD-10-CM | POA: Diagnosis present

## 2022-04-07 DIAGNOSIS — R600 Localized edema: Secondary | ICD-10-CM | POA: Diagnosis not present

## 2022-04-07 DIAGNOSIS — R790 Abnormal level of blood mineral: Secondary | ICD-10-CM | POA: Diagnosis not present

## 2022-04-07 DIAGNOSIS — F419 Anxiety disorder, unspecified: Secondary | ICD-10-CM | POA: Diagnosis present

## 2022-04-07 DIAGNOSIS — Z930 Tracheostomy status: Secondary | ICD-10-CM

## 2022-04-07 DIAGNOSIS — G8929 Other chronic pain: Secondary | ICD-10-CM | POA: Diagnosis present

## 2022-04-07 DIAGNOSIS — R5381 Other malaise: Secondary | ICD-10-CM | POA: Diagnosis present

## 2022-04-07 DIAGNOSIS — E662 Morbid (severe) obesity with alveolar hypoventilation: Secondary | ICD-10-CM | POA: Diagnosis present

## 2022-04-07 DIAGNOSIS — R1310 Dysphagia, unspecified: Secondary | ICD-10-CM | POA: Diagnosis present

## 2022-04-07 DIAGNOSIS — E119 Type 2 diabetes mellitus without complications: Secondary | ICD-10-CM

## 2022-04-07 DIAGNOSIS — Z79899 Other long term (current) drug therapy: Secondary | ICD-10-CM | POA: Diagnosis not present

## 2022-04-07 DIAGNOSIS — R609 Edema, unspecified: Secondary | ICD-10-CM | POA: Diagnosis not present

## 2022-04-07 DIAGNOSIS — I82621 Acute embolism and thrombosis of deep veins of right upper extremity: Secondary | ICD-10-CM | POA: Diagnosis present

## 2022-04-07 DIAGNOSIS — H6122 Impacted cerumen, left ear: Secondary | ICD-10-CM | POA: Diagnosis present

## 2022-04-07 DIAGNOSIS — F329 Major depressive disorder, single episode, unspecified: Secondary | ICD-10-CM | POA: Diagnosis present

## 2022-04-07 DIAGNOSIS — I10 Essential (primary) hypertension: Secondary | ICD-10-CM

## 2022-04-07 DIAGNOSIS — I272 Pulmonary hypertension, unspecified: Secondary | ICD-10-CM | POA: Diagnosis present

## 2022-04-07 DIAGNOSIS — L853 Xerosis cutis: Secondary | ICD-10-CM | POA: Diagnosis not present

## 2022-04-07 DIAGNOSIS — W19XXXA Unspecified fall, initial encounter: Secondary | ICD-10-CM | POA: Diagnosis not present

## 2022-04-07 DIAGNOSIS — F17210 Nicotine dependence, cigarettes, uncomplicated: Secondary | ICD-10-CM | POA: Diagnosis present

## 2022-04-07 DIAGNOSIS — J9601 Acute respiratory failure with hypoxia: Secondary | ICD-10-CM | POA: Diagnosis not present

## 2022-04-07 DIAGNOSIS — R770 Abnormality of albumin: Secondary | ICD-10-CM | POA: Diagnosis not present

## 2022-04-07 DIAGNOSIS — M25561 Pain in right knee: Secondary | ICD-10-CM | POA: Diagnosis not present

## 2022-04-07 DIAGNOSIS — M5430 Sciatica, unspecified side: Secondary | ICD-10-CM | POA: Diagnosis present

## 2022-04-07 DIAGNOSIS — I5023 Acute on chronic systolic (congestive) heart failure: Secondary | ICD-10-CM | POA: Diagnosis not present

## 2022-04-07 DIAGNOSIS — Z6841 Body Mass Index (BMI) 40.0 and over, adult: Secondary | ICD-10-CM | POA: Diagnosis not present

## 2022-04-07 DIAGNOSIS — M62838 Other muscle spasm: Secondary | ICD-10-CM | POA: Diagnosis present

## 2022-04-07 DIAGNOSIS — M792 Neuralgia and neuritis, unspecified: Secondary | ICD-10-CM

## 2022-04-07 DIAGNOSIS — J449 Chronic obstructive pulmonary disease, unspecified: Secondary | ICD-10-CM | POA: Diagnosis present

## 2022-04-07 DIAGNOSIS — Z885 Allergy status to narcotic agent status: Secondary | ICD-10-CM

## 2022-04-07 DIAGNOSIS — Y92239 Unspecified place in hospital as the place of occurrence of the external cause: Secondary | ICD-10-CM | POA: Diagnosis not present

## 2022-04-07 DIAGNOSIS — K5900 Constipation, unspecified: Secondary | ICD-10-CM | POA: Diagnosis not present

## 2022-04-07 DIAGNOSIS — G510 Bell's palsy: Secondary | ICD-10-CM | POA: Diagnosis present

## 2022-04-07 DIAGNOSIS — J9690 Respiratory failure, unspecified, unspecified whether with hypoxia or hypercapnia: Secondary | ICD-10-CM | POA: Diagnosis present

## 2022-04-07 DIAGNOSIS — R519 Headache, unspecified: Secondary | ICD-10-CM | POA: Diagnosis present

## 2022-04-07 DIAGNOSIS — I5042 Chronic combined systolic (congestive) and diastolic (congestive) heart failure: Secondary | ICD-10-CM | POA: Diagnosis present

## 2022-04-07 DIAGNOSIS — J80 Acute respiratory distress syndrome: Secondary | ICD-10-CM | POA: Diagnosis not present

## 2022-04-07 DIAGNOSIS — Z7982 Long term (current) use of aspirin: Secondary | ICD-10-CM

## 2022-04-07 DIAGNOSIS — Z931 Gastrostomy status: Secondary | ICD-10-CM

## 2022-04-07 DIAGNOSIS — E1140 Type 2 diabetes mellitus with diabetic neuropathy, unspecified: Secondary | ICD-10-CM | POA: Diagnosis present

## 2022-04-07 DIAGNOSIS — R4189 Other symptoms and signs involving cognitive functions and awareness: Secondary | ICD-10-CM | POA: Diagnosis present

## 2022-04-07 DIAGNOSIS — G9341 Metabolic encephalopathy: Secondary | ICD-10-CM | POA: Diagnosis present

## 2022-04-07 DIAGNOSIS — N179 Acute kidney failure, unspecified: Secondary | ICD-10-CM | POA: Diagnosis not present

## 2022-04-07 LAB — CBC WITH DIFFERENTIAL/PLATELET
Abs Immature Granulocytes: 0.03 10*3/uL (ref 0.00–0.07)
Basophils Absolute: 0 10*3/uL (ref 0.0–0.1)
Basophils Relative: 0 %
Eosinophils Absolute: 0.3 10*3/uL (ref 0.0–0.5)
Eosinophils Relative: 4 %
HCT: 31.5 % — ABNORMAL LOW (ref 39.0–52.0)
Hemoglobin: 9.8 g/dL — ABNORMAL LOW (ref 13.0–17.0)
Immature Granulocytes: 0 %
Lymphocytes Relative: 16 %
Lymphs Abs: 1.1 10*3/uL (ref 0.7–4.0)
MCH: 27.9 pg (ref 26.0–34.0)
MCHC: 31.1 g/dL (ref 30.0–36.0)
MCV: 89.7 fL (ref 80.0–100.0)
Monocytes Absolute: 0.7 10*3/uL (ref 0.1–1.0)
Monocytes Relative: 10 %
Neutro Abs: 5 10*3/uL (ref 1.7–7.7)
Neutrophils Relative %: 70 %
Platelets: 292 10*3/uL (ref 150–400)
RBC: 3.51 MIL/uL — ABNORMAL LOW (ref 4.22–5.81)
RDW: 17.1 % — ABNORMAL HIGH (ref 11.5–15.5)
WBC: 7.1 10*3/uL (ref 4.0–10.5)
nRBC: 0 % (ref 0.0–0.2)

## 2022-04-07 LAB — GLUCOSE, CAPILLARY
Glucose-Capillary: 113 mg/dL — ABNORMAL HIGH (ref 70–99)
Glucose-Capillary: 127 mg/dL — ABNORMAL HIGH (ref 70–99)
Glucose-Capillary: 78 mg/dL (ref 70–99)

## 2022-04-07 MED ORDER — POLYETHYLENE GLYCOL 3350 17 G PO PACK
17.0000 g | PACK | Freq: Every day | ORAL | Status: DC
  Administered 2022-04-08 – 2022-04-21 (×4): 17 g via ORAL
  Filled 2022-04-07 (×18): qty 1

## 2022-04-07 MED ORDER — JUVEN PO PACK
1.0000 | PACK | Freq: Two times a day (BID) | ORAL | Status: DC
  Administered 2022-04-08 – 2022-04-28 (×17): 1 via ORAL
  Filled 2022-04-07 (×25): qty 1

## 2022-04-07 MED ORDER — PANTOPRAZOLE SODIUM 40 MG PO TBEC
40.0000 mg | DELAYED_RELEASE_TABLET | Freq: Every day | ORAL | Status: DC
  Administered 2022-04-08 – 2022-04-29 (×22): 40 mg via ORAL
  Filled 2022-04-07 (×22): qty 1

## 2022-04-07 MED ORDER — ESCITALOPRAM OXALATE 10 MG PO TABS
10.0000 mg | ORAL_TABLET | Freq: Every day | ORAL | Status: DC
  Administered 2022-04-08 – 2022-04-29 (×22): 10 mg via ORAL
  Filled 2022-04-07 (×22): qty 1

## 2022-04-07 MED ORDER — MAGNESIUM HYDROXIDE 400 MG/5ML PO SUSP: 30.0000 mL | Freq: Every day | ORAL | Status: DC | PRN

## 2022-04-07 MED ORDER — ADULT MULTIVITAMIN W/MINERALS CH
1.0000 | ORAL_TABLET | Freq: Every day | ORAL | Status: DC
  Administered 2022-04-08 – 2022-04-29 (×22): 1 via ORAL
  Filled 2022-04-07 (×22): qty 1

## 2022-04-07 MED ORDER — DICLOFENAC SODIUM 1 % EX GEL
2.0000 g | Freq: Four times a day (QID) | CUTANEOUS | Status: DC
  Administered 2022-04-07 – 2022-04-28 (×25): 2 g via TOPICAL
  Filled 2022-04-07 (×2): qty 100

## 2022-04-07 MED ORDER — INSULIN ASPART 100 UNIT/ML IJ SOLN
0.0000 [IU] | Freq: Three times a day (TID) | INTRAMUSCULAR | Status: DC
  Administered 2022-04-07 – 2022-04-10 (×7): 3 [IU] via SUBCUTANEOUS
  Administered 2022-04-10: 1 [IU] via SUBCUTANEOUS
  Administered 2022-04-11 – 2022-04-13 (×4): 3 [IU] via SUBCUTANEOUS
  Administered 2022-04-14 (×2): 4 [IU] via SUBCUTANEOUS
  Administered 2022-04-14 – 2022-04-15 (×2): 3 [IU] via SUBCUTANEOUS
  Administered 2022-04-15: 4 [IU] via SUBCUTANEOUS
  Administered 2022-04-16: 3 [IU] via SUBCUTANEOUS
  Administered 2022-04-16: 4 [IU] via SUBCUTANEOUS
  Administered 2022-04-17 (×3): 3 [IU] via SUBCUTANEOUS
  Administered 2022-04-18: 4 [IU] via SUBCUTANEOUS
  Administered 2022-04-19 (×2): 3 [IU] via SUBCUTANEOUS
  Administered 2022-04-19 – 2022-04-20 (×3): 7 [IU] via SUBCUTANEOUS
  Administered 2022-04-21 – 2022-04-22 (×6): 3 [IU] via SUBCUTANEOUS
  Administered 2022-04-23: 4 [IU] via SUBCUTANEOUS
  Administered 2022-04-23 – 2022-04-24 (×3): 3 [IU] via SUBCUTANEOUS
  Administered 2022-04-25: 4 [IU] via SUBCUTANEOUS
  Administered 2022-04-25: 3 [IU] via SUBCUTANEOUS
  Administered 2022-04-26: 4 [IU] via SUBCUTANEOUS
  Administered 2022-04-26: 3 [IU] via SUBCUTANEOUS
  Administered 2022-04-26 – 2022-04-29 (×7): 4 [IU] via SUBCUTANEOUS

## 2022-04-07 MED ORDER — CARBAMIDE PEROXIDE 6.5 % OT SOLN
5.0000 [drp] | Freq: Two times a day (BID) | OTIC | Status: DC
  Administered 2022-04-07 – 2022-04-23 (×3): 5 [drp] via OTIC
  Filled 2022-04-07: qty 15

## 2022-04-07 MED ORDER — VITAMIN D (ERGOCALCIFEROL) 1.25 MG (50000 UNIT) PO CAPS
50000.0000 [IU] | ORAL_CAPSULE | ORAL | Status: DC
  Administered 2022-04-12 – 2022-04-26 (×3): 50000 [IU] via ORAL
  Filled 2022-04-07 (×3): qty 1

## 2022-04-07 MED ORDER — GUAIFENESIN-DM 100-10 MG/5ML PO SYRP: 5.0000 mL | ORAL_SOLUTION | Freq: Four times a day (QID) | ORAL | Status: DC | PRN

## 2022-04-07 MED ORDER — VITAMIN D 25 MCG (1000 UNIT) PO TABS
2000.0000 [IU] | ORAL_TABLET | Freq: Every day | ORAL | Status: DC
  Administered 2022-04-08 – 2022-04-29 (×22): 2000 [IU] via ORAL
  Filled 2022-04-07 (×22): qty 2

## 2022-04-07 MED ORDER — GABAPENTIN 600 MG PO TABS
600.0000 mg | ORAL_TABLET | Freq: Two times a day (BID) | ORAL | Status: DC
  Administered 2022-04-07 – 2022-04-09 (×4): 600 mg via ORAL
  Filled 2022-04-07 (×4): qty 1

## 2022-04-07 MED ORDER — ALUM & MAG HYDROXIDE-SIMETH 200-200-20 MG/5ML PO SUSP: 30.0000 mL | ORAL | Status: DC | PRN

## 2022-04-07 MED ORDER — BISACODYL 5 MG PO TBEC: 5.0000 mg | DELAYED_RELEASE_TABLET | Freq: Every day | ORAL | Status: DC | PRN

## 2022-04-07 MED ORDER — MUPIROCIN 2 % EX OINT: TOPICAL_OINTMENT | Freq: Two times a day (BID) | CUTANEOUS | Status: DC

## 2022-04-07 MED ORDER — CYCLOBENZAPRINE HCL 10 MG PO TABS
10.0000 mg | ORAL_TABLET | Freq: Two times a day (BID) | ORAL | Status: DC | PRN
  Administered 2022-04-08 – 2022-04-19 (×3): 10 mg via ORAL
  Filled 2022-04-07 (×3): qty 1

## 2022-04-07 MED ORDER — ACETAMINOPHEN 325 MG PO TABS
325.0000 mg | ORAL_TABLET | ORAL | Status: DC | PRN
  Administered 2022-04-08 – 2022-04-23 (×6): 650 mg via ORAL
  Filled 2022-04-07 (×7): qty 2

## 2022-04-07 MED ORDER — CAMPHOR-MENTHOL 0.5-0.5 % EX LOTN: TOPICAL_LOTION | CUTANEOUS | Status: DC | PRN

## 2022-04-07 MED ORDER — PROCHLORPERAZINE 25 MG RE SUPP: 12.5000 mg | Freq: Four times a day (QID) | RECTAL | Status: DC | PRN

## 2022-04-07 MED ORDER — INSULIN DETEMIR 100 UNIT/ML ~~LOC~~ SOLN
22.0000 [IU] | Freq: Two times a day (BID) | SUBCUTANEOUS | Status: DC
  Administered 2022-04-07 – 2022-04-16 (×18): 22 [IU] via SUBCUTANEOUS
  Filled 2022-04-07 (×20): qty 0.22

## 2022-04-07 MED ORDER — IPRATROPIUM-ALBUTEROL 0.5-2.5 (3) MG/3ML IN SOLN: 3.0000 mL | Freq: Four times a day (QID) | RESPIRATORY_TRACT | Status: DC | PRN

## 2022-04-07 MED ORDER — LORAZEPAM 0.5 MG PO TABS
0.5000 mg | ORAL_TABLET | Freq: Three times a day (TID) | ORAL | Status: DC | PRN
  Administered 2022-04-14 – 2022-04-18 (×5): 0.5 mg via ORAL
  Filled 2022-04-07 (×5): qty 1

## 2022-04-07 MED ORDER — ENSURE MAX PROTEIN PO LIQD
11.0000 [oz_av] | Freq: Three times a day (TID) | ORAL | Status: DC
  Administered 2022-04-07 – 2022-04-25 (×13): 11 [oz_av] via ORAL

## 2022-04-07 MED ORDER — DOCUSATE SODIUM 100 MG PO CAPS
100.0000 mg | ORAL_CAPSULE | Freq: Two times a day (BID) | ORAL | Status: DC
  Administered 2022-04-09 – 2022-04-27 (×9): 100 mg via ORAL
  Filled 2022-04-07 (×36): qty 1

## 2022-04-07 MED ORDER — OXYCODONE HCL 5 MG PO TABS
10.0000 mg | ORAL_TABLET | ORAL | Status: DC | PRN
  Administered 2022-04-07 – 2022-04-29 (×61): 10 mg via ORAL
  Filled 2022-04-07 (×63): qty 2

## 2022-04-07 MED ORDER — METOPROLOL TARTRATE 5 MG/5ML IV SOLN: 5.0000 mg | Freq: Four times a day (QID) | INTRAVENOUS | Status: DC | PRN

## 2022-04-07 MED ORDER — APIXABAN 5 MG PO TABS
5.0000 mg | ORAL_TABLET | Freq: Two times a day (BID) | ORAL | Status: DC
  Administered 2022-04-07 – 2022-04-29 (×44): 5 mg via ORAL
  Filled 2022-04-07 (×44): qty 1

## 2022-04-07 MED ORDER — DIPHENHYDRAMINE HCL 12.5 MG/5ML PO ELIX
12.5000 mg | ORAL_SOLUTION | Freq: Once | ORAL | Status: AC
  Administered 2022-04-07: 12.5 mg via ORAL
  Filled 2022-04-07: qty 10

## 2022-04-07 MED ORDER — PROCHLORPERAZINE MALEATE 5 MG PO TABS
5.0000 mg | ORAL_TABLET | Freq: Four times a day (QID) | ORAL | Status: DC | PRN
  Filled 2022-04-07: qty 2
  Filled 2022-04-07: qty 1

## 2022-04-07 MED ORDER — LIDOCAINE 5 % EX PTCH
1.0000 | MEDICATED_PATCH | CUTANEOUS | Status: DC
  Administered 2022-04-07 – 2022-04-28 (×22): 1 via TRANSDERMAL
  Filled 2022-04-07 (×21): qty 1

## 2022-04-07 MED ORDER — METHOCARBAMOL 500 MG PO TABS
500.0000 mg | ORAL_TABLET | Freq: Four times a day (QID) | ORAL | Status: DC | PRN
  Administered 2022-04-10: 500 mg via ORAL
  Filled 2022-04-07: qty 1

## 2022-04-07 MED ORDER — CARVEDILOL 6.25 MG PO TABS
6.2500 mg | ORAL_TABLET | Freq: Two times a day (BID) | ORAL | Status: DC
  Administered 2022-04-07 – 2022-04-14 (×15): 6.25 mg via ORAL
  Filled 2022-04-07 (×15): qty 1

## 2022-04-07 MED ORDER — PROCHLORPERAZINE EDISYLATE 10 MG/2ML IJ SOLN: 5.0000 mg | Freq: Four times a day (QID) | INTRAMUSCULAR | Status: DC | PRN

## 2022-04-07 MED ORDER — FLEET ENEMA 7-19 GM/118ML RE ENEM: 1.0000 | ENEMA | Freq: Once | RECTAL | Status: DC | PRN

## 2022-04-07 NOTE — H&P (Addendum)
Physical Medicine and Rehabilitation Admission H&P     CC: Debility secondary to just of heart failure and respiratory failure associated with obesity hypoventilation syndrome, obstructive sleep apnea   HPI: Samuel Chavez is a 46 year old male who presented to the emergency department on 7/at [facility] with complaints of increased lower extremity edema despite taking home torsemide. During triage was found to be hypoxic and placed on BiPAP.  He was initially treated with Lasix, Solu-Medrol and bronchodilators.  His respiratory status deteriorated and he required intubation and mechanical ventilation.  Comorbid conditions include obstructive sleep apnea with question of whether he had sleep study or not, congestive heart failure, Bell's palsy of the left face, type 2 diabetes mellitus, morbid obesity, diabetic neuropathy, pulmonary hypertension, COPD and ongoing tobacco use.  Nephrology was consulted on 7/27 with acute kidney injury with likely due to cardiorenal syndrome.  He was placed on heparin infusion and vascular surgery consulted for concern for pulmonary embolism.  The patient underwent tracheostomy by Dr. Richardson Landry on 8/8.  Cardiology was consulted on 8/10.  Recent echocardiogram showed EF of 40 to 45%.  He developed fever postoperatively and infectious disease consultation was obtained and the patient was started on Zosyn.  Treated for MRSA pneumonia with linezolid.  ENT was reconsulted for purulent nasal drainage and protrusion of tongue.  He underwent diagnostic nasal endoscopy.  Tongue laceration noted and Peridex ordered.  Prevotella sinusitis treated Unasyn.  He had gradual improvement in ventilatory requirements.  He underwent gastrostomy tube placement on 8/25.  He was transitioned to trach collar on 8/30.  Passy-Muir valve and diet advancement on 8/31. The patient requires inpatient physical medicine and rehabilitation evaluations and treatment secondary to dysfunction due to respiratory failure,  congestive heart failure, morbid obesity.   ROS Review of Systems  Constitutional:  Positive for weight loss.  HENT:  Negative for nosebleeds.        + tracheostomy  Eyes:  Negative for discharge and redness.  Respiratory:  Negative for cough, sputum production, shortness of breath and stridor.   Cardiovascular:  Positive for leg swelling. Negative for chest pain.  Gastrointestinal:  Negative for heartburn, nausea and vomiting.       + Gtube  Genitourinary:  Negative for flank pain and hematuria.  Musculoskeletal:  Negative for back pain, joint pain and neck pain.  Skin:  Negative for rash.  Neurological:  Positive for speech change and weakness. Negative for sensory change and headaches.  Endo/Heme/Allergies: Negative.   Psychiatric/Behavioral:  Negative for hallucinations.         Past Medical History:  Diagnosis Date   Diabetes mellitus without complication ([place])     Hypertension     Pancreatitis           Past Surgical History:  Procedure Laterality Date   CHOLECYSTECTOMY       TRACHEOSTOMY TUBE PLACEMENT N/A 02/25/2022    Procedure: TRACHEOSTOMY;  Surgeon: Clyde Canterbury, MD;  Location: [facility] ORS;  Service: ENT;  Laterality: N/A;    History reviewed. No pertinent family history. Social History:  reports that he has been smoking cigarettes. He has been smoking an average of 1 pack per day. He has never used smokeless tobacco. He reports that he does not drink alcohol and does not use drugs. Allergies:      Allergies  Allergen Reactions   Hydrocodone Swelling          Medications Prior to Admission  Medication Sig Dispense Refill   losartan (COZAAR) 100 MG  tablet Take 100 mg by mouth daily.       OZEMPIC, 0.25 OR 0.5 MG/DOSE, 2 MG/3ML SOPN SMARTSIG:0.25 Milligram(s) SUB-Q Once a Week       torsemide (DEMADEX) 20 MG tablet Take 20 mg by mouth daily.       Aspirin-Salicylamide-Caffeine (BC HEADACHE POWDER PO) Take 1 packet by mouth as needed (for pain).       cephALEXin  (KEFLEX) 500 MG capsule Take 1 capsule (500 mg total) by mouth 4 (four) times daily. (Patient not taking: Reported on 02/11/2022) 20 capsule 0   cyclobenzaprine (FLEXERIL) 10 MG tablet Take 0.5-1 tablets (5-10 mg total) by mouth 2 (two) times daily as needed for muscle spasms. (Patient not taking: Reported on 02/11/2022) 20 tablet 0   gabapentin (NEURONTIN) 600 MG tablet Take 600 mg by mouth at bedtime. (Patient not taking: Reported on 02/11/2022)       LABETALOL HCL PO Take by mouth. (Patient not taking: Reported on 02/11/2022)       meloxicam (MOBIC) 15 MG tablet Take 1 tablet (15 mg total) by mouth daily. Take 1 daily with food. (Patient not taking: Reported on 02/11/2022) 10 tablet 0   metFORMIN (GLUCOPHAGE) 500 MG tablet Take 500 mg by mouth 2 (two) times daily with a meal. (Patient not taking: Reported on 02/11/2022)       METFORMIN HCL ER, MOD, PO Take by mouth. (Patient not taking: Reported on 02/11/2022)       METOPROLOL SUCCINATE ER PO Take by mouth. (Patient not taking: Reported on 02/11/2022)       oxyCODONE-acetaminophen (PERCOCET) 10-325 MG tablet Take 1 tablet by mouth See admin instructions. Take 1 tablet by mouth 4-5 times a day as needed for pain (Patient not taking: Reported on 02/11/2022)       oxyCODONE-acetaminophen (PERCOCET/ROXICET) 5-325 MG tablet Take 1 tablet by mouth every 8 (eight) hours as needed for severe pain. (Patient not taking: Reported on 02/11/2022) 4 tablet 0   predniSONE (DELTASONE) 20 MG tablet Take 2 tablets (40 mg total) by mouth daily. (Patient not taking: Reported on 02/11/2022) 6 tablet 0          Home: Home Living Family/patient expects to be discharged to:: Private residence Living Arrangements:  (Lives with daughter, [name] and other children of his; he has 13 kids total) Available Help at Discharge: Family, Available 24 hours/day Type of Home: House Home Access: Stairs to enter [employer] of Steps: 3 to 4 Entrance Stairs-Rails: None Home  Layout: One level Bathroom Shower/Tub: Chiropodist: Standard Bathroom Accessibility: Yes Additional Comments: PLOF and home layout clarified wiht patient and family on 9/13  Lives With: Family   Functional History: Prior Function Prior Level of Function : Independent/Modified Independent Mobility Comments: pt reports ambulatory without AD community distances - communicated via head nods/shakes however mildly delayed responses ADLs Comments: independent   Functional Status:  Mobility: Bed Mobility Overal bed mobility: Needs Assistance Bed Mobility: Rolling, Sidelying to Sit, Sit to Sidelying, Sit to Supine Rolling: Supervision (Increased time to roll L with use of bedrails) Sidelying to sit: Min assist, Mod assist Supine to sit: Min assist, Mod assist Sit to supine: Mod assist, Max assist Sit to sidelying: Mod assist, Max assist General bed mobility comments: pt was able to exit L side of bed with ioncrased time + min-mod assist of one. heavy use of bedrail. after standing trials, pt required more assistance to return to supine. mod-max of one. Pt was able to  reposition to [facility] with min assist only after sliding down to FOB. Transfers Overall transfer level: Needs assistance Equipment used: Rolling walker (2 wheels) (Bariatric) Transfers: Sit to/from Stand Sit to Stand: Min assist, Mod assist, From elevated surface, +2 safety/equipment Bed to/from chair/wheelchair/BSC transfer type:: Stand pivot Stand pivot transfers: Mod assist, +2 physical assistance, From elevated surface, +2 safety/equipment Transfer via Lift Equipment:  (hoyer) General transfer comment: pt stood 4 x EOB (elevated) to bariatric RW. he stood ~ 10 sec each of first 3 attempts with bed height slightly lowered each attempt. Final attempt pt stood for 20 sec. Did attempt on several transfers to progress to clearing LEs from floor however pt unable. He puts forth great effort and is progressing  quickly. Ambulation/Gait General Gait Details: unable to progress from EOB due to strength deficits and safety   ADL: ADL Overall ADL's : Needs assistance/impaired General ADL Comments: MAX A don B socks in sitting. SUPERVISION seated grooming tasks.   Cognition: Cognition Overall Cognitive Status: Within Functional Limits for tasks assessed Orientation Level: Oriented X4 Cognition Arousal/Alertness: Awake/alert Behavior During Therapy: WFL for tasks assessed/performed Overall Cognitive Status: Within Functional Limits for tasks assessed General Comments: pt was A and O x 4. Agreeable and cooperative throughout. Is motivated to improve independence Difficult to assess due to: Tracheostomy   Physical Exam: Blood pressure (!) 133/90, pulse 79, temperature 98.4 F (36.9 C), temperature source Oral, resp. rate 19, height 5\' 9"  (1.753 m), weight (!) 167.8 kg, SpO2 100 %. Physical Exam   General: Morbidly obese AAM, No acute distress Mood and affect are appropriate Heart: Regular rate and rhythm no rubs murmurs or extra sounds Lungs: Clear to auscultation, breathing unlabored, no rales or wheezes Abdomen: Positive bowel sounds, soft nontender to palpation, nondistended Extremities: No clubbing, cyanosis, or edema Skin: No evidence of breakdown, no evidence of rash Neurologic: Cranial nerves II through XII intact, motor strength is 5/5 in bilateral deltoid, bicep, tricep, grip, 3-hip flexor, 3- knee extensors,4/5 ankle dorsiflexor and plantar flexor Sensory exam normal sensation to light touch  in bilateral upper and lower extremities  Musculoskeletal: Full range of motion in all 4 extremities. No joint swelling  Lab Results Last 48 Hours        Results for orders placed or performed during the hospital encounter of 02/11/22 (from the past 48 hour(s))  Glucose, capillary     Status: Abnormal    Collection Time: 04/05/22 11:15 AM  Result Value Ref Range    Glucose-Capillary 145 (H)  70 - 99 mg/dL      Comment: Glucose reference range applies only to samples taken after fasting for at least 8 hours.  Glucose, capillary     Status: Abnormal    Collection Time: 04/05/22  3:53 PM  Result Value Ref Range    Glucose-Capillary 160 (H) 70 - 99 mg/dL      Comment: Glucose reference range applies only to samples taken after fasting for at least 8 hours.  Glucose, capillary     Status: Abnormal    Collection Time: 04/05/22  9:14 PM  Result Value Ref Range    Glucose-Capillary 153 (H) 70 - 99 mg/dL      Comment: Glucose reference range applies only to samples taken after fasting for at least 8 hours.  Glucose, capillary     Status: Abnormal    Collection Time: 04/06/22  7:48 AM  Result Value Ref Range    Glucose-Capillary 131 (H) 70 - 99 mg/dL  Comment: Glucose reference range applies only to samples taken after fasting for at least 8 hours.  Glucose, capillary     Status: Abnormal    Collection Time: 04/06/22 12:09 PM  Result Value Ref Range    Glucose-Capillary 134 (H) 70 - 99 mg/dL      Comment: Glucose reference range applies only to samples taken after fasting for at least 8 hours.  Glucose, capillary     Status: Abnormal    Collection Time: 04/06/22  4:12 PM  Result Value Ref Range    Glucose-Capillary 131 (H) 70 - 99 mg/dL      Comment: Glucose reference range applies only to samples taken after fasting for at least 8 hours.  Glucose, capillary     Status: Abnormal    Collection Time: 04/06/22  9:07 PM  Result Value Ref Range    Glucose-Capillary 123 (H) 70 - 99 mg/dL      Comment: Glucose reference range applies only to samples taken after fasting for at least 8 hours.  Glucose, capillary     Status: Abnormal    Collection Time: 04/07/22  8:37 AM  Result Value Ref Range    Glucose-Capillary 113 (H) 70 - 99 mg/dL      Comment: Glucose reference range applies only to samples taken after fasting for at least 8 hours.      Imaging Results (Last 48 hours)  No  results found.         Blood pressure (!) 133/90, pulse 79, temperature 98.4 F (36.9 C), temperature source Oral, resp. rate 19, height 5\' 9"  (1.753 m), weight (!) 167.8 kg, SpO2 100 %.   Medical Problem List and Plan: 1. Functional deficits secondary to respiratory failure             -patient may not shower             -ELOS/Goals: 14-20d 2.  Antithrombotics: -DVT/anticoagulation:  Pharmaceutical: Eliquis             -antiplatelet therapy: none 3. Pain Management: Tylenol as needed             -Neurontin BID, Lidoderm daily; oxycodone and Flexeril prn for sciatica and neuropathy 4. Mood/Behavior/Sleep: LCSW to evaluate and provide emotional support             -antipsychotic agents: n/a             -depression: continue Lexapro 5. Neuropsych/cognition: This patient is capable of making decisions on his own behalf. 6. Skin/Wound Care: Routine skin care checks             -right ischial DTI             -routine trach and PEG care 7. 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              -continue Duo neb prn 9: Heart failure with preserved EF:             Continue Coreg 6.25 mg BID             -daily weight 10: Left ear wax: continue Debrox  BID 11: Morbid obesity: BMI 54.64; dietary counseling 12: LLE sciatica: continue Neurontin and Flexeril 13: DM: CBGs 4 times daily             -continue Levemir 22 units BID             -SSI 14: Decreased serum magnesium: repeat in AM 15: Anemia: follow-up BMP 16: RUE brachial DVT secondary to PICC: on Eliquis 17: Vitamin D deficiency: continue supplementation 18: Left knee pain: continue Voltaren gel, Lidoderm 19: Tobacco use: Cessation counseling 20.  Dysphagia  improved s/p PEG 8/25, cannot remove earlier than 04/25/22, pt now eating regular diet    Barbie Banner, PA-C 04/07/2022  "I have personally performed a face to face diagnostic evaluation of this patient.  Additionally, I have reviewed and concur with the physician assistant's documentation above." Charlett Blake M.D. [employer] [facility] Fellow Am Acad of Phys Med and Rehab Diplomate Am Board of Electrodiagnostic Med Fellow Am Board of Interventional Pain

## 2022-04-07 NOTE — Progress Notes (Signed)
Kirsteins, Luanna Salk, MD  Physician Other PMR Pre-admission     Signed Date of Service:  04/04/2022  3:06 PM  Related encounter: ED to Hosp-Admission (Discharged) from 02/11/2022 in [employer]   Signed      PMR Admission Coordinator Pre-Admission Assessment   Patient: Samuel Chavez is an 46 y.o., male MRN: [id number] DOB: Feb 20, 1976 Height: 5\' 9"  (175.3 cm) Weight: (!) 167.8 kg                                                                                                                                                  Insurance Information HMO:     PPO:      PCP:      IPA:      80/20:      OTHER:  PRIMARY: [place] Medicaid Healthy Blue      Policy#: [id number]      Subscriber: pt CM Name: Olegario Shearer      Phone#: [phone]     Fax#: [phone] Pre-Cert#: [id number] approved for 7 days from admit      Employer:  Benefits:  Phone #: [phone]     Name: 8/14 Eff. Date: 01/19/20     Deduct: none      Out of Pocket Max: none      Life Max: none  CIR: 100% per medicaid guidelines      SNF: 100% Outpatient: $4 per visit     Co-Pay: per medical neccesity Home Health: 100%      Co-Pay: per medical neccesity DME: 100%     Co-Pay: per medical neccesity Providers: in network  SECONDARY: none   Financial Counselor:       Phone#:    [employer]" for patients in Inpatient Rehabilitation Facilities with attached "Privacy Act Grant Records" was provided and verbally reviewed with: N/A   Emergency Contact Information Contact Information       Name Relation Home Work Mobile    [place] Daughter     [phone]    Rochel Brome     [place] Daughter     [phone]         Current Medical History  Patient Admitting Diagnosis: Critical Illness Myopathy   History of Present Illness: 46 year old male with history of type 2 DM, HTN, pancreatitis, tobacco abuse, systolic CHF,  OSA,diabetic neuropathy, lumbar radiculopathy and morbid obesity . Presented on 02/11/22 with acute onset of worsening LE edema with associated dyspnea and orthopnea. He felt he had gained over 30 lbs from his dry weight. Placed on BiPAP in ED initially due to respiratory distress and significant lethargy.   He later decompensated and was intubated. Eventual trach placed. He has been downsized per ENT and using PMSV and on 28 % trach collar. No plans for decannulation  prior to discharge home. Treated for MRSA HCAP and completed course with  linezolid. Prevotella sinusitis completed Unasyn and Augmentin per ID recommendations. DM poorly controlled. Continue on Levemir, SSI with CBGS. Initially with tube feeds, but now transitioned to diet. Gastric tube remains and surgery recommends to remove once the track has matured.  Encephalopathy has resolved. Felt developed critical illness myopathy and need extensive PT, OT and SLP prior to discharge home. LLE with history of sciatica. LLE [place] negative for DVT. Continue on gabapentin and flexeril. BP trending up on 9/16/ BNP checked and found to be mildy elevated. Patient given one dose of Lasix and BP has since improved. Glasgow Coma Scale Score: 15   Past Medical History      Past Medical History:  Diagnosis Date   Diabetes mellitus without complication ([place])     Hypertension     Pancreatitis      Has the patient had major surgery during 100 days prior to admission? Yes   Family History  family history is not on file.     Current Medications    Current Facility-Administered Medications:    acetaminophen (TYLENOL) tablet 650 mg, 650 mg, Oral, Q6H PRN, 650 mg at 04/02/22 2338 **OR** acetaminophen (TYLENOL) suppository 650 mg, 650 mg, Rectal, Q6H PRN, Sakai, Isami, DO   apixaban (ELIQUIS) tablet 5 mg, 5 mg, Oral, BID, Delena Bali, RPH, 5 mg at 04/07/22 1003   camphor-menthol (SARNA) lotion, , Topical, PRN, Sharion Settler, NP, Given at 04/02/22  0854   carbamide peroxide (DEBROX) 6.5 % OTIC (EAR) solution 5 drop, 5 drop, Left EAR, BID, Jimmye Norman, Jamiese M, MD, 5 drop at 04/07/22 1008   carvedilol (COREG) tablet 6.25 mg, 6.25 mg, Oral, BID WC, Delena Bali, RPH, 6.25 mg at 04/07/22 1004   Chlorhexidine Gluconate Cloth 2 % PADS 6 each, 6 each, Topical, Q0600, Sakai, Isami, DO, 6 each at 04/06/22 0806   cholecalciferol (VITAMIN D3) 25 MCG (1000 UNIT) tablet 2,000 Units, 2,000 Units, Oral, Daily, Annita Brod, MD, 2,000 Units at 04/07/22 1003   cyclobenzaprine (FLEXERIL) tablet 10 mg, 10 mg, Oral, BID PRN, Delena Bali, RPH, 10 mg at 04/02/22 0446   diclofenac Sodium (VOLTAREN) 1 % topical gel 2 g, 2 g, Topical, QID, Raulkar, Clide Deutscher, MD, 2 g at 04/06/22 1219   diphenhydrAMINE (BENADRYL) capsule 25 mg, 25 mg, Oral, Q6H PRN, Wyvonnia Dusky, MD, 25 mg at 04/06/22 2131   docusate sodium (COLACE) capsule 100 mg, 100 mg, Oral, BID, Amery, Sahar, MD, 100 mg at 04/05/22 2211   escitalopram (LEXAPRO) tablet 10 mg, 10 mg, Oral, Daily, Delena Bali, RPH, 10 mg at 04/07/22 1003   free water 30 mL, 30 mL, Per Tube, BID, Wyvonnia Dusky, MD, 30 mL at 04/06/22 0807   gabapentin (NEURONTIN) tablet 600 mg, 600 mg, Oral, BID, Delena Bali, RPH, 600 mg at 04/07/22 1003   insulin aspart (novoLOG) injection 0-20 Units, 0-20 Units, Subcutaneous, TID AC, Foust, Katy L, NP, 3 Units at 04/06/22 1619   insulin detemir (LEVEMIR) injection 22 Units, 22 Units, Subcutaneous, BID, Sakai, Isami, DO, 22 Units at 04/07/22 1021   ipratropium-albuterol (DUONEB) 0.5-2.5 (3) MG/3ML nebulizer solution 3 mL, 3 mL, Nebulization, Q6H PRN, Wyvonnia Dusky, MD, 3 mL at 04/04/22 2119   lactulose (CHRONULAC) 10 GM/15ML solution 20 g, 20 g, Oral, BID PRN, Wyvonnia Dusky, MD, 20 g at 03/28/22 1619   lidocaine (LIDODERM) 5 % 1 patch, 1 patch,  Transdermal, Q24H, Darel Hong D, NP, 1 patch at 04/06/22 2123   metoprolol tartrate  (LOPRESSOR) injection 5 mg, 5 mg, Intravenous, Q6H PRN, Annita Brod, MD, 5 mg at 04/05/22 0845   multivitamin with minerals tablet 1 tablet, 1 tablet, Oral, Daily, Nolberto Hanlon, MD, 1 tablet at 04/07/22 1005   mupirocin ointment (BACTROBAN) 2 %, , Nasal, BID, Sakai, Isami, DO, Given at 04/07/22 1005   nutrition supplement (JUVEN) (JUVEN) powder packet 1 packet, 1 packet, Oral, BID BM, Wyvonnia Dusky, MD, 1 packet at 04/06/22 1219   Oral care mouth rinse, 15 mL, Mouth Rinse, PRN, Darel Hong D, NP   oxyCODONE (Oxy IR/ROXICODONE) immediate release tablet 10 mg, 10 mg, Oral, Q4H PRN, Wyvonnia Dusky, MD, 10 mg at 04/07/22 0207   pantoprazole (PROTONIX) EC tablet 40 mg, 40 mg, Oral, Daily, Delena Bali, RPH, 40 mg at 04/07/22 1003   polyethylene glycol (MIRALAX / GLYCOLAX) packet 17 g, 17 g, Oral, Daily, Kurtis Bushman, Sahar, MD, 17 g at 04/05/22 0836   protein supplement (ENSURE MAX) liquid, 11 oz, Oral, TID, Annita Brod, MD, 11 oz at 04/06/22 0808   Vitamin D (Ergocalciferol) (DRISDOL) 1.25 MG (50000 UNIT) capsule 50,000 Units, 50,000 Units, Oral, Q7 days, Raulkar, Clide Deutscher, MD, 50,000 Units at 04/05/22 0847   Patients Current Diet:  Diet Order                  Diet regular Room service appropriate? Yes with Assist; Fluid consistency: Thin  Diet effective now                       Precautions / Restrictions Precautions Precautions: Fall Precaution Comments: trach with PMV (5 L O2 / 20%) Restrictions Weight Bearing Restrictions: No Other Position/Activity Restrictions: HOB 30 deg- trach    Has the patient had 2 or more falls or a fall with injury in the past year?No   Prior Activity Level Limited Community (1-2x/wk): Independent without AD   Prior Functional Level Prior Function Prior Level of Function : Independent/Modified Independent Mobility Comments: pt reports ambulatory without AD community distances - communicated via head nods/shakes however  mildly delayed responses ADLs Comments: independent   Self Care: Did the patient need help bathing, dressing, using the toilet or eating?  Independent   Indoor Mobility: Did the patient need assistance with walking from room to room (with or without device)? Independent   Stairs: Did the patient need assistance with internal or external stairs (with or without device)? Independent   Functional Cognition: Did the patient need help planning regular tasks such as shopping or remembering to take medications? Independent   Patient Information Are you of Hispanic, Latino/a,or Spanish origin?: A. No, not of Hispanic, Latino/a, or Spanish origin What is your race?: B. Black or African American Do you need or want an interpreter to communicate with a doctor or health care staff?: 0. No   Patient's Response To:  Health Literacy and Transportation Is the patient able to respond to health literacy and transportation needs?: Yes Health Literacy - How often do you need to have someone help you when you read instructions, pamphlets, or other written material from your doctor or pharmacy?: Never In the past 12 months, has lack of transportation kept you from medical appointments or from getting medications?: No In the past 12 months, has lack of transportation kept you from meetings, work, or from getting things needed for daily living?: No  Home Assistive Devices / Equipment Home Assistive Devices/Equipment: None   Prior Device Use: Indicate devices/aids used by the patient prior to current illness, exacerbation or injury? None of the above   Current Functional Level Cognition   Overall Cognitive Status: Within Functional Limits for tasks assessed Difficult to assess due to: Tracheostomy Orientation Level: Oriented X4 General Comments: pt was A and O x 4. Agreeable and cooperative throughout. Is motivated to improve independence    Extremity Assessment (includes Sensation/Coordination)    Upper Extremity Assessment: LUE deficits/detail, RUE deficits/detail RUE Deficits / Details: 3-/5 grip. 3/5 wrist flexion/extension. 2/5 elbow flexion/extension LUE Deficits / Details: 3-/5 grip. 3+/5 wrist flexion/extension. 2+/5 elbow flexion/extension  Lower Extremity Assessment: RLE deficits/detail, LLE deficits/detail RLE Deficits / Details: 2-/5 grossly     ADLs   Overall ADL's : Needs assistance/impaired General ADL Comments: MAX A don B socks in sitting. SUPERVISION seated grooming tasks.     Mobility   Overal bed mobility: Needs Assistance Bed Mobility: Rolling, Sidelying to Sit, Sit to Sidelying, Sit to Supine Rolling: Supervision (Increased time to roll L with use of bedrails) Sidelying to sit: Min assist, Mod assist Supine to sit: Min assist, Mod assist Sit to supine: Mod assist, Max assist Sit to sidelying: Mod assist, Max assist General bed mobility comments: pt was able to exit L side of bed with ioncrased time + min-mod assist of one. heavy use of bedrail. after standing trials, pt required more assistance to return to supine. mod-max of one. Pt was able to reposition to [place] with min assist only after sliding down to FOB.     Transfers   Overall transfer level: Needs assistance Equipment used: Rolling walker (2 wheels) (Bariatric) Transfers: Sit to/from Stand Sit to Stand: Min assist, Mod assist, From elevated surface, +2 safety/equipment Bed to/from chair/wheelchair/BSC transfer type:: Stand pivot Stand pivot transfers: Mod assist, +2 physical assistance, From elevated surface, +2 safety/equipment Transfer via Lift Equipment:  (hoyer) General transfer comment: pt stood 4 x EOB (elevated) to bariatric RW. he stood ~ 10 sec each of first 3 attempts with bed height slightly lowered each attempt. Final attempt pt stood for 20 sec. Did attempt on several transfers to progress to clearing LEs from floor however pt unable. He puts forth great effort and is progressing quickly.      Ambulation / Gait / Stairs / Wheelchair Mobility   Ambulation/Gait General Gait Details: unable to progress from EOB due to strength deficits and safety     Posture / Balance Dynamic Sitting Balance Sitting balance - Comments: supervision while sitting EOB. Balance Overall balance assessment: Needs assistance Sitting-balance support: No upper extremity supported, Feet supported Sitting balance-Leahy Scale: Fair Sitting balance - Comments: supervision while sitting EOB. Postural control: Right lateral lean Standing balance support: Bilateral upper extremity supported Standing balance-Leahy Scale: Poor Standing balance comment: Very limited standing endurance however is able to maintain standing balance with BUE support. Unable to stand without UE support.     Special needs/care consideration New trach this admit with plans to discharge home with trach. 9/13 Shiley XLT uncuffed #6 16 FR G tube placed 03/14/22. Not in use now    Previous Home Environment  Living Arrangements:  (Lives with daughter, [name] and other children of his; he has 13 kids total)  Lives With: Family Available Help at Discharge: Family, Available 24 hours/day Type of Home: House Home Layout: One level Home Access: Stairs to enter Entrance Stairs-Rails: None Entrance Stairs-Number of Steps: 3 to  4 Bathroom Shower/Tub: Optometrist: Yes How Accessible: Accessible via walker Home Care Services: No Additional Comments: PLOF and home layout clarified wiht patient and family on 9/13   Discharge Living Setting Plans for Discharge Living Setting: House, Lives with (comment) (daughter, [name]) Type of Home at Discharge: House Discharge Home Layout: One level Discharge Home Access: Stairs to enter Entrance Stairs-Rails: None Entrance Stairs-Number of Steps: 3 to 4 Discharge Bathroom Shower/Tub: Tub/shower unit Discharge Bathroom Toilet:  Standard Discharge Bathroom Accessibility: Yes How Accessible: Accessible via walker Does the patient have any problems obtaining your medications?: No   Daughter states they are moving in a few weeks from now to new rental but that house is not ready. She states it will be more that 3 weeks form no when clarified on 9/18   Social/Family/Support Systems Patient Roles: Parent Contact Information: daughter, [name] Anticipated Caregiver: daughter adn children ( ages 64 to 22 years old). Not all in the home Anticipated Caregiver's Contact Information: see contacts Ability/Limitations of Caregiver: Daughter works days, but other family avaialble when she is at work Building control surveyor Availability: 24/7 Discharge Plan Discussed with Primary Caregiver: Yes Is Caregiver In Agreement with Plan?: Yes Does Caregiver/Family have Issues with Lodging/Transportation while Pt is in Rehab?: No   Goals Patient/Family Goal for Rehab: supervision PT, supervision to min OT, supervision with SLP Expected length of stay: ELOS 14 to 20 days Additional Information: Pulmonary plans for him to go home with trach. Daughter needs continued education that was begun at [facility] with respiratory Pt/Family Agrees to Admission and willing to participate: Yes Program Orientation Provided & Reviewed with Pt/Caregiver Including Roles  & Responsibilities: Yes   Decrease burden of Care through IP rehab admission: n/a   Possible need for SNF placement upon discharge:not anticipated   Patient Condition: This patient's medical and functional status remains the same since the consult dated: 04/04/22 in which the Rehabilitation Physician determined and documented that the patient's condition is appropriate for intensive rehabilitative care in an inpatient rehabilitation facility. See "History of Present Illness" (above) for medical update. Functional : overall mod to max assist. Patient's medical and functional status update has been  discussed with the Rehabilitation physician and patient remains appropriate for inpatient rehabilitation. Will admit to inpatient rehab today.   Preadmission Screen Completed By:  Cleatrice Burke, RN, 04/07/2022 10:52 AM ______________________________________________________________________   Discussed status with Dr. Letta Pate on 04/07/22 at 1015 and received approval for admission today.   Admission Coordinator:  Cleatrice Burke, time 4562  Date 04/07/22            Revision History                                Note Details  Author Charlett Blake, MD File Time 04/07/2022 11:04 AM  Author Type Physician Status Signed  Last Editor Charlett Blake, [place] # [id number] Admit Date 04/07/2022

## 2022-04-07 NOTE — Progress Notes (Signed)
Patient ID: Samuel Chavez, male   DOB: 16-Jun-1976, 46 y.o.   MRN: [id number] Met with the patient to review current situation, rehab process, team conference and plan of care. Patient anxious to have PEG and TRACH out; reviewed time frames for both with expected dischage with trach. PMSV intact, on RA without distress noted. On regular diet however specific instructions for low potassium diet, extra gravy etc. Patient is DM with A1C 10.1. Reviewed medications and dietary modification recommendations. Reviewed trach care education for discharge. Continue to follow along to discharge to address educational needs to facilitate preparation for discharge with daughter. Margarito Liner

## 2022-04-07 NOTE — Plan of Care (Signed)
  Problem: RH BLADDER ELIMINATION Goal: RH STG MANAGE BLADDER WITH ASSISTANCE Description: STG Manage Bladder With toileting Assistance Outcome: Progressing   Problem: RH SAFETY Goal: RH STG ADHERE TO SAFETY PRECAUTIONS W/ASSISTANCE/DEVICE Description: STG Adhere to Safety Precautions With Assistance/Device. Outcome: Progressing   Problem: Education: Goal: Knowledge about tracheostomy care/management will improve Description: Independent with care using handouts and educational resources Outcome: Progressing   Problem: Activity: Goal: Ability to tolerate increased activity will improve Description: Supervision level Outcome: Progressing

## 2022-04-07 NOTE — Progress Notes (Addendum)
Inpatient Rehabilitation Admission Medication Review by a Pharmacist  A complete drug regimen review was completed for this patient to identify any potential clinically significant medication issues.  High Risk Drug Classes Is patient taking? Indication by Medication  Antipsychotic Yes Compazine - nausea/vomiting Robitussin DM - cough  Anticoagulant Yes Eliquis - hx DVT  Antibiotic No   Opioid Yes Oxycodone - pain  Antiplatelet No   Hypoglycemics/insulin Yes Levemir, Novolog - T2DM  Vasoactive Medication Yes, as an intravenous medication Coreg PO, metoprolol IV - HTN  Chemotherapy No   Other Yes Robaxin - muscle relaxant Lexapro - depression Gabapentin, cyclobenzaprine - pain Protonix - GERD     Type of Medication Issue Identified Description of Issue Recommendation(s)  Drug Interaction(s) (clinically significant)     Duplicate Therapy     Allergy     No Medication Administration End Date     Incorrect Dose     Additional Drug Therapy Needed     Significant med changes from prior encounter (inform family/care partners about these prior to discharge).    Other  PTA meds held on hospital admission: losartan, torsemide Restart PTA meds when and if necessary during CIR admission or at time of discharge, if warranted    Clinically significant medication issues were identified that warrant physician communication and completion of prescribed/recommended actions by midnight of the next day:  No  Name of provider notified for urgent issues identified:   Provider Method of Notification:    Pharmacist comments:   Time spent performing this drug regimen review (minutes):  Wall, PharmD, BCPS 04/07/2022 1:24 PM

## 2022-04-07 NOTE — TOC Transition Note (Signed)
Transition of Care [facility]) - CM/SW Discharge Note   Patient Details  Name: Samuel Chavez MRN: [id number] Date of Birth: July 27, 1975  [facility]) CM/SW Contact:  Beverly Sessions, RN Phone Number: 04/07/2022, 3:29 PM   Clinical Narrative:     Patient was discharged today to [facility] order was signed by MD and sent to Rob with Edison Nasuti with [employer] updated     Barriers to Discharge: Continued Medical Work up   Patient Goals and CMS Choice Patient states their goals for this hospitalization and ongoing recovery are:: patient is intubated and sedated unable to voice      Discharge Placement                       Discharge Plan and Services   Discharge Planning Services: CM Consult                                 Social Determinants of Health (SDOH) Interventions     Readmission Risk Interventions     No data to display

## 2022-04-07 NOTE — Progress Notes (Signed)
Call received from [place], [place] at [facility]. Report given. All of nurse's questions answered to her satisfaction.

## 2022-04-07 NOTE — Progress Notes (Signed)
Patient arrived on unit, oriented to unit. Reviewed medications, therapy schedule, rehab routine and plan of care. States an understanding of information reviewed. No complications noted at this time. Patient reports no pain and is AX4 Delena Casebeer L Constance Hackenberg  

## 2022-04-07 NOTE — Progress Notes (Signed)
Pt discharged to [facility]. Transfer done by [facility]. Pt left in stable condition. Personal belongings given to daughter.

## 2022-04-07 NOTE — Progress Notes (Signed)
Inpatient Rehabilitation Admissions Coordinator   I have insurance approval and CIR bed at [facility] in [place] for admit today. I spoke with patient by phone and he is in agreement. I will verify room number and when it will be available . I will arrange [facility]. [place] staff to complete [facility] forms. Dr Letta Pate will be the Rehab MD admitting today. I have alerted acute team and TOC of CIR plan today.  Danne Baxter, RN, MSN Rehab Admissions Coordinator [phone] 04/07/2022 10:36 AM

## 2022-04-08 DIAGNOSIS — G7281 Critical illness myopathy: Principal | ICD-10-CM

## 2022-04-08 DIAGNOSIS — R770 Abnormality of albumin: Secondary | ICD-10-CM | POA: Diagnosis not present

## 2022-04-08 DIAGNOSIS — D649 Anemia, unspecified: Secondary | ICD-10-CM

## 2022-04-08 DIAGNOSIS — J9621 Acute and chronic respiratory failure with hypoxia: Secondary | ICD-10-CM

## 2022-04-08 DIAGNOSIS — E119 Type 2 diabetes mellitus without complications: Secondary | ICD-10-CM | POA: Diagnosis not present

## 2022-04-08 LAB — COMPREHENSIVE METABOLIC PANEL
ALT: 41 U/L (ref 0–44)
AST: 25 U/L (ref 15–41)
Albumin: 3 g/dL — ABNORMAL LOW (ref 3.5–5.0)
Alkaline Phosphatase: 157 U/L — ABNORMAL HIGH (ref 38–126)
Anion gap: 9 (ref 5–15)
BUN: 9 mg/dL (ref 6–20)
CO2: 27 mmol/L (ref 22–32)
Calcium: 8.9 mg/dL (ref 8.9–10.3)
Chloride: 100 mmol/L (ref 98–111)
Creatinine, Ser: 0.6 mg/dL — ABNORMAL LOW (ref 0.61–1.24)
GFR, Estimated: >60 mL/min (ref >60)
Glucose, Bld: 106 mg/dL — ABNORMAL HIGH (ref 70–99)
Potassium: 3.7 mmol/L (ref 3.5–5.1)
Sodium: 136 mmol/L (ref 135–145)
Total Bilirubin: 0.2 mg/dL — ABNORMAL LOW (ref 0.3–1.2)
Total Protein: 7 g/dL (ref 6.5–8.1)

## 2022-04-08 LAB — GLUCOSE, CAPILLARY
Glucose-Capillary: 105 mg/dL — ABNORMAL HIGH (ref 70–99)
Glucose-Capillary: 129 mg/dL — ABNORMAL HIGH (ref 70–99)
Glucose-Capillary: 148 mg/dL — ABNORMAL HIGH (ref 70–99)
Glucose-Capillary: 140 mg/dL — ABNORMAL HIGH (ref 70–99)

## 2022-04-08 MED ORDER — DIPHENHYDRAMINE HCL 12.5 MG/5ML PO ELIX
12.5000 mg | ORAL_SOLUTION | ORAL | Status: DC | PRN
  Administered 2022-04-08 – 2022-04-09 (×3): 12.5 mg via ORAL
  Filled 2022-04-08 (×2): qty 0
  Filled 2022-04-08: qty 10

## 2022-04-08 NOTE — Evaluation (Signed)
Occupational Therapy Assessment and Plan  Patient Details  Name: Samuel Chavez MRN: [id number] Date of Birth: February 25, 1976  OT Diagnosis: cognitive deficits and muscle weakness (generalized) Rehab Potential: Rehab Potential (ACUTE ONLY): Good ELOS: 2.5-3 weeks   Today's Date: 04/08/2022 OT Individual Time: 1010-1110 OT Individual Time Calculation (min): 60 min     Hospital Problem: Active Problems:   Respiratory failure (HCC)   Past Medical History:  Past Medical History:  Diagnosis Date   Diabetes mellitus without complication ([place])    Hypertension    Pancreatitis    Past Surgical History:  Past Surgical History:  Procedure Laterality Date   CHOLECYSTECTOMY     TRACHEOSTOMY TUBE PLACEMENT N/A 02/25/2022   Procedure: TRACHEOSTOMY;  Surgeon: Clyde Canterbury, MD;  Location: [facility] ORS;  Service: ENT;  Laterality: N/A;    Assessment & Plan Clinical Impression: Samuel Chavez is a 46 year old male who presented to the emergency department on 7/at [facility] with complaints of increased lower extremity edema despite taking home torsemide. During triage was found to be hypoxic and placed on BiPAP.  He was initially treated with Lasix, Solu-Medrol and bronchodilators.  His respiratory status deteriorated and he required intubation and mechanical ventilation.  Comorbid conditions include obstructive sleep apnea with question of whether he had sleep study or not, congestive heart failure, Bell's palsy of the left face, type 2 diabetes mellitus, morbid obesity, diabetic neuropathy, pulmonary hypertension, COPD and ongoing tobacco use.  Nephrology was consulted on 7/27 with acute kidney injury with likely due to cardiorenal syndrome.  He was placed on heparin infusion and vascular surgery consulted for concern for pulmonary embolism.  The patient underwent tracheostomy by Dr. Richardson Landry on 8/8.  Cardiology was consulted on 8/10.  Recent echocardiogram showed EF of 40 to 45%.  He developed fever  postoperatively and infectious disease consultation was obtained and the patient was started on Zosyn.  Treated for MRSA pneumonia with linezolid.  ENT was reconsulted for purulent nasal drainage and protrusion of tongue.  He underwent diagnostic nasal endoscopy.  Tongue laceration noted and Peridex ordered.  Prevotella sinusitis treated Unasyn.  He had gradual improvement in ventilatory requirements.  He underwent gastrostomy tube placement on 8/25.  He was transitioned to trach collar on 8/30.  Passy-Muir valve and diet advancement on 8/31. The patient requires inpatient physical medicine and rehabilitation evaluations and treatment secondary to dysfunction due to respiratory failure, congestive heart failure, morbid obesity Patient transferred to CIR on 04/07/2022 .    Patient currently requires max with basic self-care skills secondary to muscle weakness, decreased cardiorespiratoy endurance and decreased oxygen support, decreased safety awareness and decreased memory, and decreased sitting balance, decreased standing balance, decreased postural control, and decreased balance strategies.  Prior to hospitalization, patient could complete ADLs with independent .  Patient will benefit from skilled intervention to increase independence with basic self-care skills prior to discharge home with care partner.  Anticipate patient will require 24 hour supervision and follow up home health.  OT - End of Session Activity Tolerance: Tolerates < 10 min activity with changes in vital signs Endurance Deficit: Yes Endurance Deficit Description: generalized weakness OT Assessment Rehab Potential (ACUTE ONLY): Good OT Barriers to Discharge: Inaccessible home environment OT Barriers to Discharge Comments: stairs, reports it is a trailer so question accessibility from w/c level OT Patient demonstrates impairments in the following area(s): Balance;Cognition;Endurance;Motor;Pain;Safety;Skin Integrity OT Basic ADL's  Functional Problem(s): Bathing;Dressing;Toileting OT Transfers Functional Problem(s): Toilet;Tub/Shower OT Additional Impairment(s): None OT Plan OT Intensity: Minimum of 1-2  x/day, 45 to 90 minutes OT Frequency: 5 out of 7 days OT Duration/Estimated Length of Stay: 2.5-3 weeks OT Treatment/Interventions: Balance/vestibular training;Self Care/advanced ADL retraining;Therapeutic Exercise;Wheelchair propulsion/positioning;UE/LE Strength taining/ROM;Skin care/wound managment;Pain management;Cognitive remediation/compensation;DME/adaptive equipment instruction;Community reintegration;Patient/family education;UE/LE Coordination activities;Discharge planning;Functional mobility training;Psychosocial support;Therapeutic Activities OT Self Feeding Anticipated Outcome(s): no goal OT Basic Self-Care Anticipated Outcome(s): (S) OT Toileting Anticipated Outcome(s): (S) OT Bathroom Transfers Anticipated Outcome(s): (S) OT Recommendation Recommendations for Other Services: Neuropsych consult Patient destination: Home Follow Up Recommendations: Home health OT Equipment Recommended: To be determined   OT Evaluation Precautions/Restrictions  Precautions Precautions: Fall Precaution Comments: PEG, trach with PMSV Restrictions Weight Bearing Restrictions: No General Chart Reviewed: Yes Family/Caregiver Present: Yes (son) Vital Signs Therapy Vitals Pulse Rate: 84 Resp: 17 Oxygen Therapy SpO2: 93 % O2 Device: Tracheostomy Collar O2 Flow Rate (L/min): 5 L/min FiO2 (%): 28 %    Home Living/Prior Functioning Home Living Family/patient expects to be discharged to:: Private residence Living Arrangements: Children Available Help at Discharge: Family, Available 24 hours/day Type of Home: House Home Access: Stairs to enter [employer] of Steps: 3 to 4 Entrance Stairs-Rails: None Home Layout: One level Bathroom Shower/Tub: Journalist, newspaper: Yes  Lives With: Family IADL History Homemaking Responsibilities: Yes (kids handle most of home tasks) Meal Prep Responsibility: Secondary Laundry Responsibility: Secondary Cleaning Responsibility: Secondary Bill Paying/Finance Responsibility: Secondary Shopping Responsibility: Secondary Current License: Yes Mode of Transportation: Car Occupation: On disability Prior Function Level of Independence: Independent with basic ADLs, Independent with gait, Independent with transfers  Able to Take Stairs?: Yes Driving: Yes Leisure: Hobbies-yes (Comment) (likes to work on cars) Vision Baseline Vision/History: 0 No visual deficits Patient Visual Report: No change from baseline Vision Assessment?: No apparent visual deficits Perception  Perception: Within Functional Limits Praxis Praxis: Intact Cognition Cognition Overall Cognitive Status: Impaired/Different from baseline Arousal/Alertness: Awake/alert Orientation Level: Person;Situation;Place Person: Oriented Place: Oriented Situation: Oriented Memory: Impaired Memory Impairment: Retrieval deficit;Decreased short term memory;Decreased recall of new information Decreased Short Term Memory: Verbal complex Attention: Sustained Sustained Attention: Appears intact Awareness: Impaired Awareness Impairment: Emergent impairment Problem Solving: Impaired Problem Solving Impairment: Functional complex Safety/Judgment: Impaired Comments: Pt with gross overestimation of abilities Brief Interview for Mental Status (BIMS) Repetition of Three Words (First Attempt): 3 Temporal Orientation: Year: Correct Temporal Orientation: Month: Accurate within 5 days Temporal Orientation: Day: Correct Recall: "Sock": No, could not recall Recall: "Blue": Yes, after cueing ("a color") Recall: "Bed": No, could not recall BIMS Summary Score: 10 Sensation Sensation Light Touch: Impaired Detail Central sensation comments: Some numbness in  toes but overall normal Hot/Cold: Appears Intact Coordination Gross Motor Movements are Fluid and Coordinated: No Fine Motor Movements are Fluid and Coordinated: No Coordination and Movement Description: Grossly deconditioned Motor  Motor Motor: Abnormal postural alignment and control Motor - Skilled Clinical Observations: Deconditioned  Trunk/Postural Assessment  Cervical Assessment Cervical Assessment: Within Functional Limits Thoracic Assessment Thoracic Assessment: Exceptions to [facility] (rounded shoulders with kyphotic posture) Lumbar Assessment Lumbar Assessment: Exceptions to [facility] (posterior pelvic tilt) Postural Control Postural Control: Deficits on evaluation Righting Reactions: delayed  Balance Balance Balance Assessed: Yes Static Sitting Balance Static Sitting - Balance Support: Feet supported Static Sitting - Level of Assistance: 6: Modified independent (Device/Increase time) Dynamic Sitting Balance Dynamic Sitting - Balance Support: Feet supported Dynamic Sitting - Level of Assistance: 5: Stand by assistance Static Standing Balance Static Standing - Balance Support: During functional activity;Bilateral upper extremity supported Static Standing - Level of Assistance: 3: Mod assist Dynamic  Standing Balance Dynamic Standing - Balance Support: During functional activity;Bilateral upper extremity supported Dynamic Standing - Level of Assistance: 1: +2 Total assist Extremity/Trunk Assessment RUE Assessment RUE Assessment: Exceptions to [facility] General Strength Comments: generalized weakness LUE Assessment LUE Assessment: Exceptions to [facility] General Strength Comments: generalized weakness  Care Tool Care Tool Self Care Eating   Eating Assist Level: Set up assist    Oral Care    Oral Care Assist Level: Supervision/Verbal cueing    Bathing   Body parts bathed by patient: Right arm;Left arm;Abdomen;Front perineal area;Chest;Right upper leg;Left upper leg;Right lower  leg;Face Body parts bathed by helper: Buttocks   Assist Level: Moderate Assistance - Patient 50 - 74%    Upper Body Dressing(including orthotics)   What is the patient wearing?: [place] only   Assist Level: Moderate Assistance - Patient 50 - 74%    Lower Body Dressing (excluding footwear)   What is the patient wearing?: Pants Assist for lower body dressing: Moderate Assistance - Patient 50 - 74%    Putting on/Taking off footwear   What is the patient wearing?: Non-skid slipper socks Assist for footwear: Maximal Assistance - Patient 25 - 49%       Care Tool Toileting Toileting activity   Assist for toileting: 2 Helpers     Care Tool Bed Mobility Roll left and right activity   Roll left and right assist level: Moderate Assistance - Patient 50 - 74%    Sit to lying activity   Sit to lying assist level: Moderate Assistance - Patient 50 - 74%    Lying to sitting on side of bed activity   Lying to sitting on side of bed assist level: the ability to move from lying on the back to sitting on the side of the bed with no back support.: Moderate Assistance - Patient 50 - 74%     Care Tool Transfers Sit to stand transfer   Sit to stand assist level: Moderate Assistance - Patient 50 - 74%    Chair/bed transfer   Chair/bed transfer assist level: 2 Pension scheme manager transfer   Assist Level: 2 [place]  Expression of Ideas and Wants Expression of Ideas and Wants: 4. Without difficulty (complex and basic) - expresses complex messages without difficulty and with speech that is clear and easy to understand  Understanding Verbal and Non-Verbal Content Understanding Verbal and Non-Verbal Content: 4. Understands (complex and basic) - clear comprehension without cues or repetitions   Memory/Recall Ability Memory/Recall Ability : That he or she is in a hospital/hospital unit;Current season   Refer to Care Plan for Long Term Goals  SHORT TERM GOAL WEEK  1 OT Short Term Goal 1 (Week 1): Pt will don LB clothing with min A OT Short Term Goal 2 (Week 1): Pt will complete sit > stand with min A OT Short Term Goal 3 (Week 1): Pt will complete a stand pivot transfer with max A of 1 OT Short Term Goal 4 (Week 1): Pt will tolerate one minute of standing with no desaturation to demo improved activity tolerance  Recommendations for other services: Neuropsych   Skilled Therapeutic Intervention ADL ADL Eating: Set up Where Assessed-Eating: Chair Grooming: Supervision/safety Where Assessed-Grooming: Chair Upper Body Bathing: Minimal assistance Where Assessed-Upper Body Bathing: Sitting at sink Lower Body Bathing: Moderate assistance Where Assessed-Lower Body Bathing: Edge of bed Upper Body Dressing: Minimal assistance Where Assessed-Upper Body Dressing: Edge of bed Lower Body  Dressing: Maximal assistance Where Assessed-Lower Body Dressing: Edge of bed Toileting: Unable to assess Mobility  Bed Mobility Bed Mobility: Sit to Supine;Supine to Sit Supine to Sit: Moderate Assistance - Patient 50-74% Sit to Supine: Moderate Assistance - Patient 50-74% Transfers Sit to Stand: Moderate Assistance - Patient 50-74% Stand to Sit: Moderate Assistance - Patient 50-74%   Skilled OT evaluation completed with the creation of pt centered OT POC. Pt educated on condition, ELOS, rehab expectations, and fall risk reduction strategies throughout session. Skilled monitoring of vitals throughout session to ensure hemodynamic and cardiorespiratory stability. Pt frequently desaturating to high 80's with activity and requiring O2 at 28% FiO2 and cueing for breathing to recover. Pt able to stand with as little as min-mod A and maintain static standing balance with heavy UE reliance with mod A. Anticipate max A for toileting. +2 stand pivot transfer. He was left sitting up with all needs met, son present.   Discharge Criteria: Patient will be discharged from OT if patient  refuses treatment 3 consecutive times without medical reason, if treatment goals not met, if there is a change in medical status, if patient makes no progress towards goals or if patient is discharged from hospital.  The above assessment, treatment plan, treatment alternatives and goals were discussed and mutually agreed upon: by patient  Curtis Sites 04/08/2022, 12:18 PM

## 2022-04-08 NOTE — Progress Notes (Signed)
Inpatient Rehabilitation Care Coordinator Assessment and Plan Patient Details  Name: Samuel Chavez MRN: [id number] Date of Birth: 03/20/1976  Today's Date: 04/08/2022  Hospital Problems: Active Problems:   Respiratory failure [facility])  Past Medical History:  Past Medical History:  Diagnosis Date   Diabetes mellitus without complication ([place])    Hypertension    Pancreatitis    Past Surgical History:  Past Surgical History:  Procedure Laterality Date   CHOLECYSTECTOMY     TRACHEOSTOMY TUBE PLACEMENT N/A 02/25/2022   Procedure: TRACHEOSTOMY;  Surgeon: Samuel Canterbury, MD;  Location: [facility] ORS;  Service: ENT;  Laterality: N/A;   Social History:  reports that he has been smoking cigarettes. He has been smoking an average of 1 pack per day. He has never used smokeless tobacco. He reports that he does not drink alcohol and does not use drugs.  Family / Support Systems Marital Status: Single Patient Roles: Parent Children: Samuel Chavez-daughter-oldest [phone]  [phone] Other Supports: Friends Anticipated Caregiver: Samuel Chavez pt does have 13 children rnaging in age from 26-15 Ability/Limitations of Caregiver: Daughtrer works days but someone will be there while she is working Careers adviser: 24/7 Family Dynamics: Close with all of his kids and stays in touch with them. He has freinds who are supportive also and he feels he has good supports  Social History Preferred language: English Religion:  Cultural Background: No issues Education: [place] - How often do you need to have someone help you when you read instructions, pamphlets, or other written material from your doctor or pharmacy?: Never Writes: Yes Employment Status: Disabled Public relations account executive Issues: No issues Guardian/Conservator: None-according to MD pt is capable of making his own decisions while here   Abuse/Neglect Abuse/Neglect Assessment Can Be Completed: Yes Physical Abuse:  Denies Verbal Abuse: Denies Sexual Abuse: Denies Exploitation of patient/patient's resources: Denies Self-Neglect: Denies  Patient response to: Social Isolation - How often do you feel lonely or isolated from those around you?: Never  Emotional Status Pt's affect, behavior and adjustment status: Pt is motivated to regain his independence and would like the trach out by DC if possible. He has come a long way and feels he needs to go further. He is glad to be here on rehab due to this is where he needs to be. His youngest son is here to provide support today Recent Psychosocial Issues: other health issues Psychiatric History: History of depression takes medications he finds helpful. He would benefit from seeing neuro-psych while here due to long hospitalization and how sick he was. Substance Abuse History: Tobacco aware he needs to quit for his lungs and plans on doing this.  Patient / Family Perceptions, Expectations & Goals Pt/Family understanding of illness & functional limitations: Pt is able to explain his hospitalization and procedures. He talaks with the MD daly and feels he has a good understanding of his treamtent plan. He hopes to get his trach out before he goes home will ask this when in conference. Premorbid pt/family roles/activities: Father, friend, cousin, etc Anticipated changes in roles/activities/participation: resume Pt/family expectations/goals: Pt states: " I hope to get back on my feet and take care of myself before I leave here." His son who is present said: " There's a lot of [place]."  [employer]: None Premorbid Home Care/DME Agencies: None Transportation available at discharge: daughter Is the patient able to respond to transportation needs?: Yes In the past 12 months, has lack of transportation kept you from medical appointments or from getting  medications?: No In the past 12 months, has lack of transportation kept you from meetings, work,  or from getting things needed for daily living?: No Resource referrals recommended: Neuropsychology  Discharge Planning Living Arrangements: Children Support Systems: Children, Other relatives, Friends/neighbors Type of Residence: Private residence Insurance Resources: [employer] (specify county) Museum/gallery curator Resources: Family Support Financial Screen Referred: No Living Expenses: Lives with family Money Management: Patient, Family Does the patient have any problems obtaining your medications?: No Home Management: daughter Patient/Family Preliminary Plans: Return home with daughter who reports are moving in the next few weeks. Aware being evaluated today and goals being set. Care Coordinator Barriers to Discharge: Insurance for SNF coverage, Weight Care Coordinator Anticipated Follow Up Needs: HH/OP  Clinical Impression Pleasant gentleman who is motivated to do well and recover form his health  issues and long hospitalization. He has numerous kids who are supportive and stays with oldest daughter-Samuel Chavez. Between family will have 24/7 care. Will work on discharge needs and update once team conference tomorrow. Will place on neuro-psych list to be seen while here.  Samuel Chavez 04/08/2022, 11:19 AM

## 2022-04-08 NOTE — Progress Notes (Signed)
Inpatient Rehabilitation  Patient information reviewed and entered into eRehab system by Hanh Kertesz M. Saidi Santacroce, M.A., CCC/SLP, PPS Coordinator.  Information including medical coding, functional ability and quality indicators will be reviewed and updated through discharge.    

## 2022-04-08 NOTE — Plan of Care (Signed)
  Problem: Sit to Stand Goal: LTG:  Patient will perform sit to stand with assistance level (PT) Description: LTG:  Patient will perform sit to stand with assistance level (PT) Flowsheets (Taken 04/08/2022 1643) LTG: PT will perform sit to stand in preparation for functional mobility with assistance level: Supervision/Verbal cueing   Problem: RH Bed Mobility Goal: LTG Patient will perform bed mobility with assist (PT) Description: LTG: Patient will perform bed mobility with assistance, with/without cues (PT). Flowsheets (Taken 04/08/2022 1643) LTG: Pt will perform bed mobility with assistance level of: Independent with assistive device    Problem: RH Bed to Chair Transfers Goal: LTG Patient will perform bed/chair transfers w/assist (PT) Description: LTG: Patient will perform bed to chair transfers with assistance (PT). Flowsheets (Taken 04/08/2022 1643) LTG: Pt will perform Bed to Chair Transfers with assistance level: Supervision/Verbal cueing   Problem: RH Car Transfers Goal: LTG Patient will perform car transfers with assist (PT) Description: LTG: Patient will perform car transfers with assistance (PT). Flowsheets (Taken 04/08/2022 1643) LTG: Pt will perform car transfers with assist:: Supervision/Verbal cueing   Problem: RH Ambulation Goal: LTG Patient will ambulate in home environment (PT) Description: LTG: Patient will ambulate in home environment, # of feet with assistance (PT). Flowsheets (Taken 04/08/2022 1643) LTG: Pt will ambulate in home environ  assist needed:: Supervision/Verbal cueing LTG: Ambulation distance in home environment: 50'   Problem: RH Wheelchair Mobility Goal: LTG Patient will propel w/c in home environment (PT) Description: LTG: Patient will propel wheelchair in home environment, # of feet with assistance (PT). Flowsheets (Taken 04/08/2022 1643) LTG: Pt will propel w/c in home environ  assist needed:: Independent with assistive device Distance: wheelchair  distance in controlled environment: 50   Problem: RH Wheelchair Mobility Goal: LTG Patient will propel w/c in community environment (PT) Description: LTG: Patient will propel wheelchair in community environment, # of feet with assist (PT) Flowsheets (Taken 04/08/2022 1643) LTG: Pt will propel w/c in community environ  assist needed:: Independent with assistive device Distance: wheelchair distance in controlled environment: 150   Problem: RH Stairs Goal: LTG Patient will ambulate up and down stairs w/assist (PT) Description: LTG: Patient will ambulate up and down # of stairs with assistance (PT) Flowsheets (Taken 04/08/2022 1643) LTG: Pt will ambulate up/down stairs assist needed:: Contact Guard/Touching assist LTG: Pt will  ambulate up and down number of stairs: 4

## 2022-04-08 NOTE — Progress Notes (Signed)
I contacted pulmonary on-call provider for consultation.

## 2022-04-08 NOTE — Progress Notes (Signed)
Patients trach capped per MD order. Pt placed on 2L [place] and sats are 96%. RT to continue to monitor.

## 2022-04-08 NOTE — Consult Note (Signed)
NAME:  Samuel Chavez, MRN:  [id number], DOB:  01/19/1976, LOS: 1 ADMISSION DATE:  04/07/2022, CONSULTATION DATE:  04/08/22 REFERRING MD:  Curlene Dolphin, CHIEF COMPLAINT:  SOB   History of Present Illness:  Known to me from prior hospitalization.  Had some combination of pneumonia and fluid overload requiring prolonged intubation, HD initiation, tracheostomy.  He has improved markedly and is now in inpatient rehab off HD.  Using PMV during day, TC at night.  Has diagnosis of OSA but intolerant to CPAP.  PCCM consulted for trach management  Pertinent  Medical History  DM2 MRSA HCAP resolved Renal failure resolved Profound metabolic encephalopathy resolved Systolic heart failure   Significant Hospital Events: Including procedures, antibiotic start and stop dates in addition to other pertinent events   9/18 admit to inpatient  Interim History / Subjective:  Consulted  Objective   Blood pressure 125/85, pulse 84, temperature 98 F (36.7 C), temperature source Oral, resp. rate 18, height 5\' 9"  (1.753 m), weight (!) 151.9 kg, SpO2 92 %.    FiO2 (%):  [28 %] 28 %   Intake/Output Summary (Last 24 hours) at 04/08/2022 1708 Last data filed at 04/08/2022 1317 Gross per 24 hour  Intake 472 ml  Output 1300 ml  Net -828 ml   Filed Weights   04/07/22 1438 04/08/22 0500  Weight: (!) 167.8 kg (!) 151.9 kg    Examination: No distress Able to breath and phonate with prox 6 XLT trach occluded Minimal edema [place] 3 Moves all 4 ext to command [facility] Problem list   N/A  Assessment & Plan:  Pulm Issue: trach related to prolonged complex stay from pneumonia and fluid overload.  These issues have been treated.  He has lost a remarkable amount of weight.  Did have some OSA and intolerant to CPAP in past but do not see this as barrier to decannulation. - Switch to 4-0 uncuffed shiley and cap it - Place [place] - Potential decannulation either later tomorrow or Friday  depending on timing of trach capping and tolerance - Will follow  Best Practice (right click and "Reselect all SmartList Selections" daily)  Per primary  Labs   CBC: Recent Labs  Lab 04/02/22 0434 04/07/22 1549  WBC 9.0 7.1  NEUTROABS  --  5.0  HGB 8.6* 9.8*  HCT 29.8* 31.5*  MCV 94.6 89.7  PLT 262 357    Basic Metabolic Panel: Recent Labs  Lab 04/02/22 0434 04/04/22 0400 04/05/22 0453 04/08/22 0521  NA 135 139  --  136  K 4.3 3.8  --  3.7  CL 97* 100  --  100  CO2 33* 30  --  27  GLUCOSE 117* 137*  --  106*  BUN 12 8  --  9  CREATININE 0.65 0.67  --  0.60*  CALCIUM 8.4* 8.3*  --  8.9  MG 1.6*  --  1.5*  --    GFR: Estimated Creatinine Clearance: 172 mL/min (A) (by C-G formula based on SCr of 0.6 mg/dL (L)). Recent Labs  Lab 04/02/22 0434 04/07/22 1549  WBC 9.0 7.1    Liver Function Tests: Recent Labs  Lab 04/08/22 0521  AST 25  ALT 41  ALKPHOS 157*  BILITOT 0.2*  PROT 7.0  ALBUMIN 3.0*   No results for input(s): "LIPASE", "AMYLASE" in the last 168 hours. No results for input(s): "AMMONIA" in the last 168 hours.  ABG    Component Value Date/Time   PHART 7.34 (L)  03/04/2022 0900   PCO2ART 46 03/04/2022 0900   PO2ART 84 03/04/2022 0900   HCO3 24.8 03/04/2022 0900   TCO2 26 03/17/2007 1822   ACIDBASEDEF 1.3 03/04/2022 0900   O2SAT 95.9 03/03/2022 1043     Coagulation Profile: No results for input(s): "INR", "PROTIME" in the last 168 hours.  Cardiac Enzymes: No results for input(s): "CKTOTAL", "CKMB", "CKMBINDEX", "TROPONINI" in the last 168 hours.  HbA1C: Hgb A1c MFr Bld  Date/Time Value Ref Range Status  02/11/2022 10:29 AM 10.1 (H) 4.8 - 5.6 % Final    Comment:    (NOTE) Pre diabetes:          5.7%-6.4%  Diabetes:              >6.4%  Glycemic control for   <7.0% adults with diabetes     CBG: Recent Labs  Lab 04/07/22 1704 04/07/22 2013 04/08/22 0626 04/08/22 1123 04/08/22 1637  GLUCAP 127* 78 105* 129* 148*     Review of Systems:    Positive Symptoms in bold:  Constitutional fevers, chills, weight loss, fatigue, anorexia, malaise  Eyes decreased vision, double vision, eye irritation  Ears, Nose, Mouth, Throat sore throat, trouble swallowing, sinus congestion  Cardiovascular chest pain, paroxysmal nocturnal dyspnea, lower ext edema, palpitations   Respiratory SOB, cough, DOE, hemoptysis, wheezing  Gastrointestinal nausea, vomiting, diarrhea  Genitourinary burning with urination, trouble urinating  Musculoskeletal joint aches, joint swelling, back pain  Integumentary  rashes, skin lesions  Neurological focal weakness, focal numbness, trouble speaking, headaches  Psychiatric depression, anxiety, confusion  Endocrine polyuria, polydipsia, cold intolerance, heat intolerance  Hematologic abnormal bruising, abnormal bleeding, unexplained nose bleeds  Allergic/Immunologic recurrent infections, hives, swollen lymph nodes     Past Medical History:  He,  has a past medical history of Diabetes mellitus without complication ([place]), Hypertension, and Pancreatitis.   Surgical History:   Past Surgical History:  Procedure Laterality Date   CHOLECYSTECTOMY     TRACHEOSTOMY TUBE PLACEMENT N/A 02/25/2022   Procedure: TRACHEOSTOMY;  Surgeon: Clyde Canterbury, MD;  Location: [facility] ORS;  Service: ENT;  Laterality: N/A;     Social History:   reports that he has been smoking cigarettes. He has been smoking an average of 1 pack per day. He has never used smokeless tobacco. He reports that he does not drink alcohol and does not use drugs.   Family History:  His family history is not on file.   Allergies Allergies  Allergen Reactions   Hydrocodone Swelling     Home Medications  Prior to Admission medications   Medication Sig Start Date End Date Taking? Authorizing Provider  Aspirin-Salicylamide-Caffeine (BC HEADACHE POWDER PO) Take 1 packet by mouth as needed (for pain).    [provider]   cephALEXin (KEFLEX) 500 MG capsule Take 1 capsule (500 mg total) by mouth 4 (four) times daily. Patient not taking: Reported on 02/11/2022 12/16/18   Davonna Belling, MD  cyclobenzaprine (FLEXERIL) 10 MG tablet Take 0.5-1 tablets (5-10 mg total) by mouth 2 (two) times daily as needed for muscle spasms. Patient not taking: Reported on 02/11/2022 03/12/18   Margarita Mail, PA-C  gabapentin (NEURONTIN) 600 MG tablet Take 600 mg by mouth at bedtime. Patient not taking: Reported on 02/11/2022 10/13/21   [provider]  LABETALOL HCL PO Take by mouth. Patient not taking: Reported on 02/11/2022    [provider]  losartan (COZAAR) 100 MG tablet Take 100 mg by mouth daily. 02/01/22   [provider]  meloxicam (MOBIC) 15 MG tablet Take 1 tablet (15 mg total) by mouth daily. Take 1 daily with food. Patient not taking: Reported on 02/11/2022 03/12/18   Margarita Mail, PA-C  metFORMIN (GLUCOPHAGE) 500 MG tablet Take 500 mg by mouth 2 (two) times daily with a meal. Patient not taking: Reported on 02/11/2022    [provider]  METFORMIN HCL ER, MOD, PO Take by mouth. Patient not taking: Reported on 02/11/2022    [provider]  METOPROLOL SUCCINATE ER PO Take by mouth. Patient not taking: Reported on 02/11/2022    [provider]  oxyCODONE-acetaminophen (PERCOCET) 10-325 MG tablet Take 1 tablet by mouth See admin instructions. Take 1 tablet by mouth 4-5 times a day as needed for pain Patient not taking: Reported on 02/11/2022    [provider]  oxyCODONE-acetaminophen (PERCOCET/ROXICET) 5-325 MG tablet Take 1 tablet by mouth every 8 (eight) hours as needed for severe pain. Patient not taking: Reported on 02/11/2022 12/16/18   Davonna Belling, MD  OZEMPIC, 0.25 OR 0.5 MG/DOSE, 2 MG/3ML SOPN SMARTSIG:0.25 Milligram(s) SUB-Q Once a Week 12/09/21   [provider]  predniSONE (DELTASONE) 20 MG tablet Take 2 tablets (40 mg total) by mouth  daily. Patient not taking: Reported on 02/11/2022 04/04/15   Davonna Belling, MD  torsemide (DEMADEX) 20 MG tablet Take 20 mg by mouth daily. 12/18/21   [provider]     Critical care time: N/A

## 2022-04-08 NOTE — Discharge Summary (Signed)
Physician Discharge Summary  Patient ID: CARMINO OCAIN MRN: [id number] DOB/AGE: [birth year] 44 y.o.  Admit date: 04/07/2022 Discharge date: 04/29/2022  Discharge Diagnoses:  Principal Problem:   Critical illness myopathy Active Problems:   Type 2 diabetes mellitus without complication, without long-term current use of insulin (HCC)   Respiratory failure (HCC)   Neuropathic pain   Reactive depression   Primary hypertension Active problems: Debility secondary to respiratory failure OHS OSA Heart failure Morbid obesity LLE sciatica DM Anemia RUE brachial vein DVT Left knee pain Tobacco use Dysphagia  Anxiety Hypoalbuminemia  Hypomagnesemia Constipation Mood stabilization Hypertension  Discharged Condition: Stable  Significant Diagnostic Studies:  Narrative & Impression  CLINICAL DATA:  Right knee pain, inpatient   EXAM: RIGHT KNEE - 1-2 VIEW   COMPARISON:  None Available.   FINDINGS: No fracture, joint effusion or dislocation. No suspicious focal osseous lesions. Minimal patellofemoral and lateral compartment right knee osteoarthritis. Generalized soft tissue swelling. No suspicious focal osseous lesions. Tiny superior right patellar enthesophyte. No radiopaque foreign bodies.   IMPRESSION: Generalized soft tissue swelling. Minimal patellofemoral and lateral compartment right knee osteoarthritis. No acute osseous abnormality.     Electronically Signed   By: Ilona Sorrel M.D.   On: 04/12/2022 15:00     Labs:  Basic Metabolic Panel: Recent Labs  Lab 04/28/22 0622  NA 139  K 4.2  CL 100  CO2 31  GLUCOSE 173*  BUN 6  CREATININE 0.55*  CALCIUM 9.3  MG 1.6*    CBC: Recent Labs  Lab 04/28/22 0622  WBC 7.7  HGB 11.4*  HCT 38.3*  MCV 92.5  PLT 248    CBG: Recent Labs  Lab 04/27/22 2134 04/28/22 0617 04/28/22 1132 04/28/22 1633 04/28/22 2047  GLUCAP 197* 190* 159* 177* 190*    Brief HPI:   Samuel Chavez is a 46 y.o. male  who presented to the ED on 7/25 with complaints of leg swelling and found to be hypoxic.  Initially treated with Lasix, Solu-Medrol and bronchodilators.  He required intubation and vent support. He underwent trach 02/25/2022 per Dr. Richardson Landry and g-tube placement. Nephrology was consulted for AKI likely due to cardiorenal syndrome. Cardiology and ID consulted.  He was placed on heparin infusion and vascular surgery consulted for concern for pulmonary emboli.  Cardiology consulted 8/10.  Recent echocardiogram ejection fraction of 40 to 45%.  Treated for MRSA pneumonia.  ransitioned to trach collar and PMV.  ENT was reconsulted for purulent nasal drainage and protrusion of tongue.  He underwent diagnostic nasal endoscopy.  Tongue laceration noted and Peridex ordered.  Prevotella sinusitis treated with Unasyn.  Patient showed gradual improvement in ventilator requirements.  Underwent gastrostomy tube placement 8/25.  He was transitioned to trach collar 8/30.  Therapy evaluations completed due to patient decreased functional mobility was admitted for a comprehensive rehab program.  Hospital Course: [name] was admitted to rehab 04/07/2022 for inpatient therapies to consist of PT, ST and OT at least three hours five days a week. Past admission physiatrist, therapy team and rehab RN have worked together to provide customized collaborative inpatient rehab. Low albumin noted and encouraged protein supplementation. Pulmonology consulted on 9/19 for concerns from patient regarding trach. Ativan added for anxiety. Switched to 4-0 uncuffed Shiley and capped  Gabapentin increased>>600 mg TID from BID as well as oxycodone and Robaxin as needed for muscle spasms. Hemoglobin improved from 8.6 to 9.8 on 9/18. Dysphagia resolved and tolerating regular diet on 9/20. Lurline Idol was decannulated  on 9/20. Had assisted fall on 9/23 and right knee xray obtained. Minimal OA and ST swelling without fracture.  Patient remained on Eliquis  for brachial DVT right upper extremity. Neuropsychology consultation on 9/25 and patient remained on Lexapro. Hemoglobin stable at 10.0. Low serum magnesium and daily mag ox started on 9/25. Complained of nicotine cravings and started on nicotine patch. He insisted on regular diet despite insulin requirements and gastrostomy tube has since been removed.  Morbid obesity BMI 53.29 dietary follow-up.  Diabetic coordinator consulted for education on 9/26. Endorsed increased edema of and pain in RLE. TED hose applied. Venous duplex obtained on 10/2 and was negative for DVT. Knee pain resolved.   Blood pressures were monitored on TID basis and remained controlled on Coreg titrated to 25 mg BID as well as lisinopril 40 mg daily and would need outpatient follow-up.  Diabetes has been monitored with ac/hs CBG checks and SSI was use prn for tighter BS control. Levemir 22 units BID continued. Increased to 24 units BID on 9/27.   Rehab course: During patient's stay in rehab weekly team conferences were held to monitor patient's progress, set goals and discuss barriers to discharge. At admission, patient required mod to max assist for ADLs and mobility.  Physical exam.  Blood pressure 133/90 pulse 79 temperature 98.4 respirations 19 oxygen saturations 100% room air Constitutional.  No acute distress HEENT Head.  Normocephalic and atraumatic Eyes.  Pupils round and reactive to light no discharge without nystagmus Neck.  Supple nontender no JVD Cardiac regular rate and rhythm without any extra sounds or murmur heard Abdomen.  Soft nontender positive bowel sounds Neurologic.  Cranial nerves II through XII intact, motor strength 5/5 bilateral deltoid, bicep, tricep, grip, 3+ hip flexor, 3 - knee flexors, 4/5 ankle dorsi plantarflexion Sensation intact to light touch  He has had improvement in activity tolerance, balance, postural control as well as ability to compensate for deficits. He has had improvement in  functional use RUE/LUE  and RLE/LLE as well as improvement in awareness.  Patient navigated 6 stairs contact-guard in 2 handrails.  Perform car transfer rolling walker supervision.  Amatory transfer with rolling walker and supervision to the commode.  Ambulates 40 feet x 2 rolling walker rest breaks as needed working with energy conservation techniques.  Patient did need assist with lower body ADLs.  Full family teaching completed plan discharge to home    Disposition: Home    Diet: Carb modified  Special Instructions:  No driving, alcohol consumption or tobacco use.   Medications at discharge. 1.  Tylenol as needed 2.  Eliquis 5 mg p.o. twice daily 3.  Dulcolax tablet 5 mg daily as needed constipation 4.  Coreg 25 mg p.o. twice daily 5.  Vitamin D 2000 units p.o. daily 6.  Robaxin 500 mg every 6 hours as needed muscle spasms 7.  Voltaren gel 2 g 4 times daily to affected area 8.  Colace 100 mg p.o. twice daily 9.  Lexapro 10 mg p.o. daily 10.  Neurontin 600 mg p.o. 3 times daily 11.  Levemir 24 units twice daily 12.  Lidoderm patch change as directed 13.  Lisinopril 40 mg p.o. daily 14.  Ativan 0.5 mg p.o. every 8 hours as needed anxiety 15.  Magnesium oxide 400 mg p.o. twice daily 16.  Multivitamin daily 17.  NicoDerm patch taper as directed 18.  Oxycodone 10 mg every 4 hours as needed pain 19.  Protonix 40 mg p.o. daily 20.  MiraLAX  daily 21.  Vitamin D 50,000 units every 7 days  30-35 minutes were spent completing discharge summary and discharge planning  Discharge Instructions     Ambulatory referral to Physical Medicine Rehab   Complete by: As directed    Moderate complexity follow up 1-2 weeks critical illness myopathy         Follow-up [place], [place] Follow up.   Contact information: [address] [place] [address] [phone]         Jennye Boroughs, MD Follow up.   Specialty: Physical Medicine and  Rehabilitation Why: office will call you to arrange your appt (sent) Contact information: [address] [place] [address] [phone]                 Signed: Cathlyn Parsons 04/29/2022, 5:19 AM

## 2022-04-08 NOTE — Progress Notes (Signed)
Inpatient [facility] Individual Statement of Services  Patient Name:  Samuel Chavez  Date:  04/08/2022  Welcome to the [facility].  Our goal is to provide you with an individualized program based on your diagnosis and situation, designed to meet your specific needs.  With this comprehensive rehabilitation program, you will be expected to participate in at least 3 hours of rehabilitation therapies Monday-Friday, with modified therapy programming on the weekends.  Your rehabilitation program will include the following services:  Physical Therapy (PT), Occupational Therapy (OT), Speech Therapy (ST), 24 hour per day rehabilitation nursing, Therapeutic Recreaction (TR), Neuropsychology, Care Coordinator, Rehabilitation Medicine, Nutrition Services, and Pharmacy Services  Weekly team conferences will be held on wednesday to discuss your progress.  Your Inpatient Rehabilitation Care Coordinator will talk with you frequently to get your input and to update you on team discussions.  Team conferences with you and your family in attendance may also be held.  Expected length of stay: 2.5-3 weeks  Overall anticipated outcome: supervison-CGA level  Depending on your progress and recovery, your program may change. Your Inpatient Rehabilitation Care Coordinator will coordinate services and will keep you informed of any changes. Your Inpatient Rehabilitation Care Coordinator's name and contact numbers are listed  below.  The following services may also be recommended but are not provided by the [facility] will be made to provide these services after discharge if needed.  Arrangements include referral to agencies that provide these services.  Your insurance has been verified to be:  [employer] Your primary doctor is:  Psychiatric nurse  Pertinent information will be shared with your doctor and your insurance company.  Inpatient Rehabilitation Care Coordinator:  Ovidio Kin, [place] or [phone]  Information discussed with and copy given to patient by: Elease Hashimoto, 04/08/2022, 11:22 AM

## 2022-04-08 NOTE — Progress Notes (Addendum)
PROGRESS NOTE   Subjective/Complaints: No new concerns or complaints reported. Denies shortness of breath. He says pain is under control. Pt says he has not had BM in 2-3 days but feels like he will have one soon today.   Review of Systems  Constitutional:  Negative for chills and fever.  Eyes:  Negative for double vision.  Respiratory:  Negative for cough and shortness of breath.   Cardiovascular:  Negative for chest pain and palpitations.  Gastrointestinal:  Negative for abdominal pain.  Genitourinary:  Negative for urgency.  Neurological:  Positive for weakness. Negative for sensory change and speech change.     Objective:   No results found. Recent Labs    04/07/22 1549  WBC 7.1  HGB 9.8*  HCT 31.5*  PLT 292   Recent Labs    04/08/22 0521  NA 136  K 3.7  CL 100  CO2 27  GLUCOSE 106*  BUN 9  CREATININE 0.60*  CALCIUM 8.9    Intake/Output Summary (Last 24 hours) at 04/08/2022 1505 Last data filed at 04/08/2022 1317 Gross per 24 hour  Intake 472 ml  Output 1950 ml  Net -1478 ml     Pressure Injury 03/08/22 Buttocks Left Stage 3 -  Full thickness tissue loss. Subcutaneous fat may be visible but bone, tendon or muscle are NOT exposed. open, red/pink and yellow; WOC updated Staging to Stage 3 03/10/22 (Active)  03/08/22 1757  Location: Buttocks  Location Orientation: Left  Staging: Stage 3 -  Full thickness tissue loss. Subcutaneous fat may be visible but bone, tendon or muscle are NOT exposed.  Wound Description (Comments): open, red/pink and yellow; WOC updated Staging to Stage 3 03/10/22  Present on Admission: No    Physical Exam: Vital Signs Blood pressure 125/85, pulse 83, temperature 98 F (36.7 C), temperature source Oral, resp. rate 17, height 5\' 9"  (1.753 m), weight (!) 151.9 kg, SpO2 94 %.  General: Morbidly obese AAM, No acute distress Mood and affect are appropriate Heart: Regular rate and  rhythm no rubs murmurs or extra sounds Lungs: decreased breath sounds due to body habitus, breathing unlabored, no rales or wheezes, trach with trach collar in place Abdomen: Positive bowel sounds, soft nontender to palpation, nondistended Extremities: No clubbing, cyanosis, or edema Skin: No evidence of breakdown, no evidence of rash Neurologic: Cranial nerves II through XII intact, motor strength is 5/5 in bilateral deltoid, bicep, tricep, grip, 3-hip flexor, 3- knee extensors,4/5 ankle dorsiflexor and plantar flexor Sensory exam normal sensation to light touch  in bilateral upper and lower extremities   Musculoskeletal: Full range of motion in all 4 extremities. No joint swelling  Assessment/Plan: 1. Functional deficits which require 3+ hours per day of interdisciplinary therapy in a comprehensive inpatient rehab setting. Physiatrist is providing close team supervision and 24 hour management of active medical problems listed below. Physiatrist and rehab team continue to assess barriers to discharge/monitor patient progress toward functional and medical goals  Care Tool:  Bathing    Body parts bathed by patient: Right arm, Left arm, Abdomen, Front perineal area, Chest, Right upper leg, Left upper leg, Right lower leg, Face   Body parts bathed  by helper: Buttocks     Bathing assist Assist Level: Moderate Assistance - Patient 50 - 74%     Upper Body Dressing/Undressing Upper body dressing   What is the patient wearing?: Hospital gown only    Upper body assist Assist Level: Moderate Assistance - Patient 50 - 74%    Lower Body Dressing/Undressing Lower body dressing      What is the patient wearing?: Pants     Lower body assist Assist for lower body dressing: Moderate Assistance - Patient 50 - 74%     Toileting Toileting    Toileting assist Assist for toileting: 2 Helpers     Transfers Chair/bed transfer  Transfers assist     Chair/bed transfer assist level: 2  Helpers     Locomotion Ambulation   Ambulation assist      Assist level: Maximal Assistance - Patient 25 - 49% Assistive device:  (HDRW) Max distance: 8'   Walk 10 feet activity   Assist  Walk 10 feet activity did not occur: Safety/medical concerns (Unable to assess gait, curb, stair mobility and picking up an item from the floor at this time secondary to impaired endurance/activity tolerance and decreased global strength)        Walk 50 feet activity   Assist Walk 50 feet with 2 turns activity did not occur: Safety/medical concerns         Walk 150 feet activity   Assist Walk 150 feet activity did not occur: Safety/medical concerns         Walk 10 feet on uneven surface  activity   Assist Walk 10 feet on uneven surfaces activity did not occur: Safety/medical concerns         Wheelchair     Assist Is the patient using a wheelchair?: Yes Type of Wheelchair: Manual    Wheelchair assist level: Total Assistance - Patient < 25% Max wheelchair distance: Patient propelled manual wheelchair ~15-20' and therapist completed the remaining distance with total assist    Wheelchair 50 feet with 2 turns activity    Assist        Assist Level: Maximal Assistance - Patient 25 - 49%   Wheelchair 150 feet activity     Assist      Assist Level: Total Assistance - Patient < 25%   Blood pressure 125/85, pulse 83, temperature 98 F (36.7 C), temperature source Oral, resp. rate 17, height 5\' 9"  (1.753 m), weight (!) 151.9 kg, SpO2 94 %.    Medical Problem List and Plan: 1. 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  Antithrombotics: -DVT/anticoagulation:  Pharmaceutical: Eliquis             -antiplatelet therapy: none 3. Pain Management: Tylenol as needed             -Neurontin BID, Lidoderm daily; oxycodone  and Flexeril prn for sciatica and neuropathy 4. Mood/Behavior/Sleep: LCSW to evaluate and provide emotional support             -antipsychotic agents: n/a             -depression: continue Lexapro  -Ativan PRN anxiety 5. Neuropsych/cognition: This patient is capable of making decisions on his own behalf. 6. Skin/Wound Care: Routine skin care checks             -  right ischial DTI             -routine trach and PEG care 7. 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              -continue Duo neb prn  -9/19 Consult pulm today 9: Heart failure with preserved EF:             Continue Coreg 6.25 mg BID             -daily weight Filed Weights   04/07/22 1438 04/08/22 0500  Weight: (!) 167.8 kg (!) 151.9 kg   10: Left ear wax: continue Debrox BID 11: Morbid obesity: BMI 54.64; dietary counseling 12: LLE sciatica: continue Neurontin and Flexeril 13: DM: CBGs 4 times daily             -continue Levemir 22 units BID             -SSI  -9/19 CBG well controlled 14: Decreased serum magnesium:   -Recheck with next BMET 15: Anemia:   -HGB improved to 9.8 16: RUE brachial DVT secondary to PICC: on Eliquis 17: Vitamin D deficiency: continue supplementation 18: Left knee pain: continue Voltaren gel, Lidoderm 19: Tobacco use: Cessation counseling 20.  Dysphagia improved s/p PEG 8/25, cannot remove earlier than 04/25/22, pt now eating regular diet  21. Low Albumin 3.0 on 9/19, encourage protein intake       LOS: 1 days A FACE TO FACE EVALUATION WAS PERFORMED  Jennye Boroughs 04/08/2022, 3:05 PM

## 2022-04-08 NOTE — Evaluation (Signed)
Speech Language Pathology Assessment and Plan  Patient Details  Name: Samuel Chavez MRN: [id number] Date of Birth: 05-09-1976  SLP Diagnosis: Cognitive Impairments  Rehab Potential: Excellent ELOS: 2.5-3 weeks    Today's Date: 04/08/2022 SLP Individual Time: 5397-6734 SLP Individual Time Calculation (min): 79 min   Hospital Problem: Active Problems:   Respiratory failure (HCC)  Past Medical History:  Past Medical History:  Diagnosis Date   Diabetes mellitus without complication ([place])    Hypertension    Pancreatitis    Past Surgical History:  Past Surgical History:  Procedure Laterality Date   CHOLECYSTECTOMY     TRACHEOSTOMY TUBE PLACEMENT N/A 02/25/2022   Procedure: TRACHEOSTOMY;  Surgeon: Clyde Canterbury, MD;  Location: [facility] ORS;  Service: ENT;  Laterality: N/A;    Assessment / Plan / Recommendation Clinical Impression Patient  is a 46 year old male who presented to the emergency department on 02/13/22 at [facility] with complaints of increased lower extremity edema despite taking home torsemide. During triage was found to be hypoxic and placed on BiPAP.    His respiratory status deteriorated and he required intubation and mechanical ventilation.  Comorbid conditions include obstructive sleep apnea, congestive heart failure, Bell's palsy of the left face, type 2 diabetes mellitus, morbid obesity, diabetic neuropathy, pulmonary hypertension, COPD and ongoing tobacco use.  Nephrology was consulted on 7/27 with acute kidney injury with likely due to cardiorenal syndrome.  He was placed on heparin infusion and vascular surgery consulted for concern for pulmonary embolism.  The patient underwent tracheostomy by Dr. Richardson Landry on 8/8.  Cardiology was consulted on 8/10.  Recent echocardiogram showed EF of 40 to 45%.  He developed fever postoperatively and infectious disease consultation was obtained and the patient was started on Zosyn.  Treated for MRSA pneumonia with linezolid.  ENT was  reconsulted for purulent nasal drainage and protrusion of tongue.  He underwent diagnostic nasal endoscopy.  Tongue laceration noted and Peridex ordered. Prevotella sinusitis treated Unasyn.  He had gradual improvement in ventilatory requirements.  He underwent gastrostomy tube placement on 8/25, was transitioned to trach collar on 8/30, with Passy-Muir valve and diet advancement on 8/31. Therapy evaluations completed with recommendations for CIR. Patient admitted 04/07/22.  Patient was administered a PMSV evaluation. Upon arrival, patient was already wearing his PMSV. Patient's vitals remained WFL throughout the session and patient demonstrated adequate breath support for verbal expression at the sentence level. Patient independently recalled when to appropriately wear his PMSV and independently donned/doffed his PMSV with SLP providing a "leash" to help reduce chances of the PMSV being lost.  Overall, patient is Mod I for use of his PMSV and for speech intelligibility, therefore, SLP will not initiate goals at this time.   Patient consumed trials of regular textures with thin liquids via straw without overt s/s of aspiration. Patient with efficient mastication but reported the dry cookie was getting "stuck on the roof of his mouth." However, patient was independently able to clear and manage solid textures independently. Recommend patient continue current diet with f/u not warranted at this time.  Patient was also administered subtests of the Cognistat with deficits in working memory, short-term recall, awareness and functional problem solving noted. Patient appeared surprised and "embarrassed" regarding difficulty of tasks and therefore requested to discontinue tasks at times. SLP provided emotional support and education regarding current deficits, potential strategies, and overall goals of skilled SLP intervention.   Patient would benefit from skilled SLP intervention to maximize his cognitive functioning  and overall functional independence  prior to discharge. Patient verbalized understanding of all education provided.    Skilled Therapeutic Interventions          Administered a PMSV evaluation, cognitive-linguistic evaluation, and BSE. Please see above for details.    SLP Assessment  Patient will need skilled Speech Lanaguage Pathology Services during CIR admission    Recommendations  Patient may use Passy-Muir Speech Valve: During all waking hours (remove during sleep);During PO intake/meals PMSV Supervision: Intermittent SLP Diet Recommendations: Thin;Age appropriate regular solids Liquid Administration via: Straw Medication Administration: Whole meds with liquid Supervision: Patient able to self feed Compensations: Minimize environmental distractions;Slow rate;Small sips/bites;Follow solids with liquid Postural Changes and/or Swallow Maneuvers: Seated upright 90 degrees;Upright 30-60 min after meal Oral Care Recommendations: Oral care BID Recommendations for Other Services: Neuropsych consult Patient destination: Home Follow up Recommendations: 24 hour supervision/assistance;Home Health SLP;Outpatient SLP Equipment Recommended:  (PMSV)    SLP Frequency 3 to 5 out of 7 days   SLP Duration  SLP Intensity  SLP Treatment/Interventions 2.5-3 weeks  Minumum of 1-2 x/day, 30 to 90 minutes  Cognitive remediation/compensation;Internal/external aids;Therapeutic Activities;Environmental controls;Cueing hierarchy;Functional tasks;Patient/family education    Pain No/Denies Pain   Prior Functioning Type of Home: House  Lives With: Family Available Help at Discharge: Family;Available 24 hours/day  SLP Evaluation Cognition Overall Cognitive Status: Impaired/Different from baseline Arousal/Alertness: Awake/alert Orientation Level: Oriented X4 Attention: Sustained Sustained Attention: Appears intact Memory: Impaired Memory Impairment: Retrieval deficit;Decreased short term  memory;Decreased recall of new information Decreased Short Term Memory: Verbal complex Awareness: Impaired Awareness Impairment: Emergent impairment Problem Solving: Impaired Problem Solving Impairment: Functional complex Safety/Judgment: Impaired Comments: Pt with gross overestimation of abilities  Comprehension Auditory Comprehension Overall Auditory Comprehension: Appears within functional limits for tasks assessed Expression Expression Primary Mode of Expression: Verbal Verbal Expression Overall Verbal Expression: Appears within functional limits for tasks assessed (#6 XL cufess trach with PMSV during all waking hours) Written Expression Dominant Hand: Right Oral Motor Oral Motor/Sensory Function Overall Oral Motor/Sensory Function: Within functional limits (history of bells palsy) Motor Speech Overall Motor Speech: Impaired (history of bells palsy) Respiration: Within functional limits Phonation: Normal Resonance: Within functional limits Articulation: Impaired Intelligibility: Intelligible Word: 75-100% accurate Phrase: 75-100% accurate Sentence: 75-100% accurate Conversation: 75-100% accurate Motor Planning: Witnin functional limits  Care Tool Care Tool Cognition Ability to hear (with hearing aid or hearing appliances if normally used Ability to hear (with hearing aid or hearing appliances if normally used): 0. Adequate - no difficulty in normal conservation, social interaction, listening to TV   Expression of Ideas and Wants Expression of Ideas and Wants: 4. Without difficulty (complex and basic) - expresses complex messages without difficulty and with speech that is clear and easy to understand   Understanding Verbal and Non-Verbal Content Understanding Verbal and Non-Verbal Content: 4. Understands (complex and basic) - clear comprehension without cues or repetitions  Memory/Recall Ability Memory/Recall Ability : That he or she is in a hospital/hospital unit;Current  season   PMSV Assessment  PMSV Trial PMSV was placed for: already placed since this morning per pt Able to redirect subglottic air through upper airway: Yes Able to Attain Phonation: Yes Voice Quality: Normal Able to Expectorate Secretions: No attempts Level of Secretion Expectoration with PMSV: Not observed Breath Support for Phonation: Adequate Intelligibility: Intelligible Word: 75-100% accurate Phrase: 75-100% accurate Sentence: 75-100% accurate Conversation: 75-100% accurate SpO2 During Trial: 94 % Pulse During Trial: 88 Behavior: Alert;Cooperative;Expresses self well;Good eye contact;Responsive to questions  Bedside Swallowing Assessment General Date of Onset: 02/11/22  Previous Swallow Assessment: BSE at [facility] upgraded to regular textures with thin liquids Diet Prior to this Study: Regular;Thin liquids Temperature Spikes Noted: No Respiratory Status: Trach Trach Size and Type: #6;Uncuffed;Extra long;With PMSV in place Date extubated: 02/25/22 Behavior/Cognition: Alert;Cooperative;Pleasant mood Oral Cavity - Dentition: Adequate natural dentition Self-Feeding Abilities: Able to feed self Vision: Functional for self-feeding Patient Positioning: Upright in bed Baseline Vocal Quality: Normal Volitional Cough: Strong Volitional Swallow: Able to elicit  Oral Care Assessment Oral Assessment  (WDL): Exceptions to WDL Lips: Symmetrical Teeth: Missing (Comment) Tongue: Pink;Moist Mucous Membrane(s): Moist;Pink Saliva: Moist, saliva free flowing Level of Consciousness: Alert Is patient on any of following O2 devices?: Tracheostomy with trach collar 24hrs/day Nutritional status: No high risk factors Oral Assessment Risk : High Risk Ice Chips Ice chips: Not tested Thin Liquid Thin Liquid: Within functional limits Presentation: Straw;Self Fed Nectar Thick Nectar Thick Liquid: Not tested Honey Thick Honey Thick Liquid: Not tested Puree Puree: Not  tested Solid Solid: Within functional limits BSE Assessment Risk for Aspiration Impact on safety and function: Mild aspiration risk Other Related Risk Factors: Deconditioning;Tracheostomy  Short Term Goals: Week 1: SLP Short Term Goal 1 (Week 1): Patient will demonstrate complex problem solving for functional tasks with Min A verbal and visual cues. SLP Short Term Goal 2 (Week 1): Patient will recall new, daily information with use of external aids with Mod verbal and visual cues.  Refer to Care Plan for Long Term Goals  Recommendations for other services: Neuropsych  Discharge Criteria: Patient will be discharged from SLP if patient refuses treatment 3 consecutive times without medical reason, if treatment goals not met, if there is a change in medical status, if patient makes no progress towards goals or if patient is discharged from hospital.  The above assessment, treatment plan, treatment alternatives and goals were discussed and mutually agreed upon: by patient  Ayson Cherubini 04/08/2022, 12:43 PM

## 2022-04-08 NOTE — Plan of Care (Signed)
  Problem: RH Problem Solving Goal: LTG Patient will demonstrate problem solving for (SLP) Description: LTG:  Patient will demonstrate problem solving for basic/complex daily situations with cues  (SLP) 04/08/2022 1246 by Buzzy Han, CCC-SLP Flowsheets (Taken 04/08/2022 1246) LTG Patient will demonstrate problem solving for:  Modified Independent  Supervision 04/08/2022 1051 by Buzzy Han, CCC-SLP Flowsheets (Taken 04/08/2022 1051) LTG: Patient will demonstrate problem solving for (SLP): Complex daily situations LTG Patient will demonstrate problem solving for: Modified Independent

## 2022-04-08 NOTE — Evaluation (Signed)
Physical Therapy Assessment and Plan  Samuel Chavez Details  Name: Samuel Chavez MRN: [id number] Date of Birth: 1975-10-19  PT Diagnosis: Abnormal posture, Abnormality of gait, Difficulty walking, Low back pain, and Muscle weakness Rehab Potential: Good ELOS: 2-3 weeks   Today's Date: 04/08/2022 PT Individual Time: 1300-1415 PT Individual Time Calculation (min): 75 min    Hospital Problem: Active Problems:   Respiratory failure (HCC)   Past Medical History:  Past Medical History:  Diagnosis Date   Diabetes mellitus without complication ([place])    Hypertension    Pancreatitis    Past Surgical History:  Past Surgical History:  Procedure Laterality Date   CHOLECYSTECTOMY     TRACHEOSTOMY TUBE PLACEMENT N/A 02/25/2022   Procedure: TRACHEOSTOMY;  Surgeon: Clyde Canterbury, MD;  Location: [facility] ORS;  Service: ENT;  Laterality: N/A;    Assessment & Plan Clinical Impression: Samuel Chavez is a 46 year old male who presented to the emergency department on 7/at [facility] with complaints of increased lower extremity edema despite taking home torsemide. During triage was found to be hypoxic and placed on BiPAP.  He was initially treated with Lasix, Solu-Medrol and bronchodilators.  His respiratory status deteriorated and he required intubation and mechanical ventilation.  Comorbid conditions include obstructive sleep apnea with question of whether he had sleep study or not, congestive heart failure, Bell's palsy of the left face, type 2 diabetes mellitus, morbid obesity, diabetic neuropathy, pulmonary hypertension, COPD and ongoing tobacco use.  Nephrology was consulted on 7/27 with acute kidney injury with likely due to cardiorenal syndrome.  He was placed on heparin infusion and vascular surgery consulted for concern for pulmonary embolism.  The Samuel Chavez underwent tracheostomy by Dr. Richardson Landry on 8/8.  Cardiology was consulted on 8/10.  Recent echocardiogram showed EF of 40 to 45%.  He developed fever postoperatively  and infectious disease consultation was obtained and the Samuel Chavez was started on Zosyn.  Treated for MRSA pneumonia with linezolid.  ENT was reconsulted for purulent nasal drainage and protrusion of tongue.  He underwent diagnostic nasal endoscopy.  Tongue laceration noted and Peridex ordered.  Prevotella sinusitis treated Unasyn.  He had gradual improvement in ventilatory requirements.  He underwent gastrostomy tube placement on 8/25.  He was transitioned to trach collar on 8/30.  Passy-Muir valve and diet advancement on 8/31. The Samuel Chavez requires inpatient physical medicine and rehabilitation evaluations and treatment secondary to dysfunction due to respiratory failure, congestive heart failure, morbid obesity.  Samuel Chavez currently requires max with mobility secondary to muscle weakness, decreased cardiorespiratoy endurance, and decreased standing balance, decreased postural control, and decreased balance strategies.  Prior to hospitalization, Samuel Chavez was independent  with mobility and lived with Family, Son, Daughter, Other (Comment) (Grandkids) in a Mobile home home.  Home access is Samuel Chavez reports 2-3 stepsStairs to enter.  Samuel Chavez will benefit from skilled PT intervention to maximize safe functional mobility, minimize fall risk, and decrease caregiver burden for planned discharge home with intermittent assist.  Anticipate Samuel Chavez will benefit from follow up [facility] at discharge.  PT - End of Session Activity Tolerance: Tolerates 30+ min activity with multiple rests Endurance Deficit: Yes Endurance Deficit Description: generalized weakness PT Assessment Rehab Potential (ACUTE/IP ONLY): Good PT Barriers to Discharge: Home environment access/layout;Trach;Insurance for SNF coverage;New oxygen PT Samuel Chavez demonstrates impairments in the following area(s): Balance;Pain;Perception;Safety;Endurance;Motor;Sensory;Skin Integrity;Nutrition PT Transfers Functional Problem(s): Bed Mobility;Bed to Chair;Car PT  Locomotion Functional Problem(s): Ambulation;Wheelchair Mobility;Stairs PT Plan PT Intensity: Minimum of 1-2 x/day ,45 to 90 minutes PT Frequency: 5 out of 7 days PT  Duration Estimated Length of Stay: 2-3 weeks PT Treatment/Interventions: Ambulation/gait training;Cognitive remediation/compensation;Discharge planning;DME/adaptive equipment instruction;Functional mobility training;Pain management;Psychosocial support;Splinting/orthotics;Therapeutic Activities;UE/LE Strength taining/ROM;Visual/perceptual remediation/compensation;Balance/vestibular training;Community reintegration;Disease management/prevention;Neuromuscular re-education;Skin care/wound management;Samuel Chavez/family education;Stair training;Therapeutic Exercise;UE/LE Coordination activities;Wheelchair propulsion/positioning PT Transfers Anticipated Outcome(s): Supv with HDRW PT Locomotion Anticipated Outcome(s): Supv for household gait ~50' with HDRW PT Recommendation Recommendations for Other Services: Neuropsych consult Follow Up Recommendations: Home health PT Samuel Chavez destination: Home Equipment Recommended: To be determined Equipment Details: TBD   PT Evaluation Precautions/Restrictions Precautions Precautions: Fall Precaution Comments: PEG, trach with PMSV Restrictions Weight Bearing Restrictions: No Other Position/Activity Restrictions: HOB 30 deg- trach Pain Interference Pain Interference Pain Effect on Sleep: 1. Rarely or not at all Pain Interference with Therapy Activities: 1. Rarely or not at all Pain Interference with Day-to-Day Activities: 1. Rarely or not at all Home Living/Prior [place] Living Arrangements: Children Available Help at Discharge: Family;Available 24 hours/day Type of Home: Mobile home Home Access: Stairs to enter Entrance Stairs-Number of Steps: Samuel Chavez reports 2-3 steps Entrance Stairs-Rails: Can reach both Home Layout: One level Bathroom Shower/Tub: Print production planner: Standard Bathroom Accessibility: Yes  Lives With: Family;Son;Daughter;Other (Comment) (Grandkids) Prior Function Level of Independence: Independent with basic ADLs;Independent with gait;Independent with homemaking with ambulation;Independent with transfers  Able to Take Stairs?: Yes Driving: Yes Vocation: On disability Leisure: Hobbies-yes (Comment) (Samuel Chavez reports he enjoys working on cars) Vision/Perception  Vision - History Ability to See in Adequate Light: 0 Adequate Perception Perception: Within Functional Limits Praxis Praxis: Intact  Cognition Overall Cognitive Status: Impaired/Different from baseline Arousal/Alertness: Awake/alert Orientation Level: Oriented X4 Year: 2023 Month: September Day of Week: Correct Attention: Sustained Sustained Attention: Appears intact Memory: Impaired Memory Impairment: Retrieval deficit;Decreased short term memory;Decreased recall of new information Decreased Short Term Memory: Verbal complex Awareness: Impaired Awareness Impairment: Emergent impairment Problem Solving: Impaired Problem Solving Impairment: Functional complex Safety/Judgment: Impaired Comments: Pt with gross overestimation of abilities Sensation Sensation Light Touch: Impaired Detail Central sensation comments: Samuel Chavez reports when he lies down in the bed his toes become numb Hot/Cold: Appears Intact Coordination Gross Motor Movements are Fluid and Coordinated: No Fine Motor Movements are Fluid and Coordinated: No Coordination and Movement Description: Grossly deconditioned Motor  Motor Motor: Abnormal postural alignment and control Motor - Skilled Clinical Observations: Deconditioned   Trunk/Postural Assessment  Cervical Assessment Cervical Assessment: Within Functional Limits Thoracic Assessment Thoracic Assessment: Exceptions to [facility] (Rounded shoulders) Lumbar Assessment Lumbar Assessment: Exceptions to [facility] (Posterior pelvic tilt) Postural  Control Postural Control: Deficits on evaluation Righting Reactions: Delayed  Balance Balance Balance Assessed: Yes Static Sitting Balance Static Sitting - Balance Support: Feet supported Static Sitting - Level of Assistance: 6: Modified independent (Device/Increase time) Dynamic Sitting Balance Dynamic Sitting - Balance Support: Feet supported Dynamic Sitting - Level of Assistance: 5: Stand by assistance Static Standing Balance Static Standing - Balance Support: During functional activity;Bilateral upper extremity supported Static Standing - Level of Assistance: 3: Mod assist Dynamic Standing Balance Dynamic Standing - Balance Support: During functional activity;Bilateral upper extremity supported Dynamic Standing - Level of Assistance: 1: +2 Total assist Extremity Assessment  RUE Assessment RUE Assessment: Exceptions to [facility] General Strength Comments: generalized weakness LUE Assessment LUE Assessment: Exceptions to [facility] General Strength Comments: generalized weakness RLE Assessment RLE Assessment: Within Functional Limits General Strength Comments: MMT performed while sitting in wc- 4/5 globally LLE Assessment LLE Assessment: Within Functional Limits General Strength Comments: MMT performed while sitting in wc- 4/5 globally  Care Tool Care Tool Bed Mobility Roll left and  right activity   Roll left and right assist level: Moderate Assistance - Samuel Chavez 50 - 74%    Sit to lying activity   Sit to lying assist level: Moderate Assistance - Samuel Chavez 50 - 74%    Lying to sitting on side of bed activity   Lying to sitting on side of bed assist level: the ability to move from lying on the back to sitting on the side of the bed with no back support.: Moderate Assistance - Samuel Chavez 50 - 74%     Care Tool Transfers Sit to stand transfer   Sit to stand assist level: Maximal Assistance - Samuel Chavez 25 - 49%    Chair/bed transfer   Chair/bed transfer assist level: 2 Pension scheme manager  transfer   Assist Level: 2 Production assistant, radio transfer activity did not occur: Safety/medical concerns (Unable to assess car transfer at this time secondary to poor endurance/activity tolerance and decreased global strength)        Care Tool Locomotion Ambulation   Assist level: Maximal Assistance - Samuel Chavez 25 - 49% Assistive device:  (HDRW) Max distance: 8'  Walk 10 feet activity Walk 10 feet activity did not occur: Safety/medical concerns (Unable to assess gait, curb, stair mobility and picking up an item from the floor at this time secondary to impaired endurance/activity tolerance and decreased global strength)       Walk 50 feet with 2 turns activity Walk 50 feet with 2 turns activity did not occur: Safety/medical concerns      Walk 150 feet activity Walk 150 feet activity did not occur: Safety/medical concerns      Walk 10 feet on uneven surfaces activity Walk 10 feet on uneven surfaces activity did not occur: Safety/medical concerns      Stairs Stair activity did not occur: Safety/medical concerns        Walk up/down 1 step activity Walk up/down 1 step or curb (drop down) activity did not occur: Safety/medical concerns      Walk up/down 4 steps activity Walk up/down 4 steps activity did not occur: Safety/medical concerns      Walk up/down 12 steps activity Walk up/down 12 steps activity did not occur: Safety/medical concerns      Pick up small objects from floor Pick up small object from the floor (from standing position) activity did not occur: Safety/medical concerns      Wheelchair Is the Samuel Chavez using a wheelchair?: Yes Type of Wheelchair: Manual   Wheelchair assist level: Total Assistance - Samuel Chavez < 25% Max wheelchair distance: Samuel Chavez propelled manual wheelchair ~15-20' and therapist completed the remaining distance with total assist  Wheel 50 feet with 2 turns activity   Assist Level: Maximal Assistance - Samuel Chavez 25 - 49%  Wheel 150 feet  activity   Assist Level: Total Assistance - Samuel Chavez < 25%    Refer to Care Plan for Long Term Goals  SHORT TERM GOAL WEEK 1 PT Short Term Goal 1 (Week 1): Samuel Chavez will complete bed mobility with MinA PT Short Term Goal 2 (Week 1): Samuel Chavez will perform stand pivot transfer with HDRW and MinA PT Short Term Goal 3 (Week 1): Samuel Chavez will ambulate 3' with HDRW and MinA  Recommendations for other services: Neuropsych  Skilled Therapeutic Intervention Mobility Bed Mobility Bed Mobility: Sit to Supine;Supine to Sit;Rolling Right;Rolling Left Rolling Right: Moderate Assistance - Samuel Chavez 50-74% Rolling Left: Moderate Assistance - Samuel Chavez 50-74% Supine to Sit: Moderate Assistance - Samuel Chavez 50-74% Sit  to Supine: Moderate Assistance - Samuel Chavez 50-74% Transfers Transfers: Sit to Stand;Stand to Sit;Stand Pivot Transfers Sit to Stand: Maximal Assistance - Samuel Chavez 25-49% Stand to Sit: Maximal Assistance - Samuel Chavez 25-49% Stand Pivot Transfers: 2 Press photographer (Assistive device): Manufacturing systems engineer Ambulation: Yes Gait Assistance: 2 Helpers (Wheelchair follow for safety) Social research officer, government (Feet): 8 Feet Assistive device: Other (Comment) (HDRW) Gait Assistance Details: Verbal cues for technique;Verbal cues for gait pattern;Verbal cues for precautions/safety;Verbal cues for safe use of DME/AE Gait Gait: Yes Gait Pattern: Step-to pattern;Trunk flexed;Wide base of support Gait velocity: Decreased Stairs / Additional Locomotion Stairs: No (Unable to assess stair, curb, gait >8' and picking up an object from the floor secondary to poor endurance/activity tolerance and decreasd global strength) Wheelchair Mobility Wheelchair Mobility: Yes Wheelchair Assistance: Total Assistance - Samuel Chavez <25% Wheelchair Propulsion: Both upper extremities Wheelchair Parts Management: Needs assistance Distance: Samuel Chavez was able to propel ~15', therapist completed the rest of wheelchair mobility with  total assist  Skilled Intervention- Samuel Chavez greeted sitting upright in wheelchair in room with son present and agreeable to PT treatment session. Samuel Chavez presents on 5L oxygen with FiO2 28%- Therapist providing skilled monitoring of vitals with functional activity. Samuel Chavez noted to desaturate into the mid-80s with exertion requiring supplemental oxygen, VC for not holding his breath and extended seated rest breaks on order for saturation to increase into the 90s. Samuel Chavez performed multiple sit/stands throughout treatment session with HDRW and MaxA for initiating standing- Samuel Chavez was only able to tolerate ~30 seconds of standing initially, however with increased trials he was able to tolerate standing for increased amounts of time and introduce gait. Samuel Chavez gait trained x4', x8' with HDRW and [place] with close wheelchair follow for safety. Samuel Chavez impulsively sits throughout gait trials requiring VC for improved safety awareness. Samuel Chavez attempted to propel manual wheelchair, however unable to propel wheelchair with B UE >15-20' secondary to decreased UE strength, poor traction and poor endurance/activity tolerance, as well as body habitus limiting proper positioning. Samuel Chavez presents with adequate strength in B LE- His endurance/activity tolerance limits his abilities with functional mobility tasks. Samuel Chavez educated on staying out of the bed as much as possible in order to improve activity tolerance and endurance- Samuel Chavez agreeable and requesting to stay out of the bed stating, "I'm sick of that thing." Samuel Chavez returned to his room sitting upright in his wheelchair with call bell within reach and all needs met.    Discharge Criteria: Samuel Chavez will be discharged from PT if Samuel Chavez refuses treatment 3 consecutive times without medical reason, if treatment goals not met, if there is a change in medical status, if Samuel Chavez makes no progress towards goals or if Samuel Chavez is discharged from hospital.  The above  assessment, treatment plan, treatment alternatives and goals were discussed and mutually agreed upon: by Samuel Chavez  [place] 04/08/2022, 2:31 PM

## 2022-04-09 DIAGNOSIS — M792 Neuralgia and neuritis, unspecified: Secondary | ICD-10-CM

## 2022-04-09 LAB — GLUCOSE, CAPILLARY
Glucose-Capillary: 128 mg/dL — ABNORMAL HIGH (ref 70–99)
Glucose-Capillary: 113 mg/dL — ABNORMAL HIGH (ref 70–99)
Glucose-Capillary: 146 mg/dL — ABNORMAL HIGH (ref 70–99)
Glucose-Capillary: 172 mg/dL — ABNORMAL HIGH (ref 70–99)

## 2022-04-09 MED ORDER — GABAPENTIN 600 MG PO TABS
600.0000 mg | ORAL_TABLET | Freq: Three times a day (TID) | ORAL | Status: DC
  Administered 2022-04-09 – 2022-04-29 (×60): 600 mg via ORAL
  Filled 2022-04-09 (×60): qty 1

## 2022-04-09 MED ORDER — DIPHENHYDRAMINE HCL 12.5 MG/5ML PO ELIX
12.5000 mg | ORAL_SOLUTION | ORAL | Status: DC | PRN
  Administered 2022-04-10 – 2022-04-29 (×53): 12.5 mg via ORAL
  Filled 2022-04-09: qty 10
  Filled 2022-04-09: qty 0
  Filled 2022-04-09 (×3): qty 10
  Filled 2022-04-09 (×2): qty 0
  Filled 2022-04-09 (×11): qty 10
  Filled 2022-04-09: qty 0
  Filled 2022-04-09 (×2): qty 10
  Filled 2022-04-09 (×2): qty 0
  Filled 2022-04-09 (×12): qty 10
  Filled 2022-04-09: qty 0
  Filled 2022-04-09 (×6): qty 10
  Filled 2022-04-09 (×2): qty 0
  Filled 2022-04-09: qty 10
  Filled 2022-04-09: qty 0
  Filled 2022-04-09: qty 10
  Filled 2022-04-09: qty 0
  Filled 2022-04-09 (×4): qty 10
  Filled 2022-04-09 (×3): qty 0
  Filled 2022-04-09: qty 10

## 2022-04-09 NOTE — Progress Notes (Signed)
NAME:  Samuel Chavez, MRN:  [id number], DOB:  [birth year], LOS: 2 ADMISSION DATE:  04/07/2022, CONSULTATION DATE:  9/19 REFERRING MD:  Curlene Dolphin, CHIEF COMPLAINT:  SOB   History of Present Illness:  Samuel Chavez is a 46 year old male  with OSA, CHF, T2DM, morbid obesity, pulmonary hypertension, COPD, and tobacco abuse who presented to the emergency department at [facility] on 7/25 with complaints of increased lower extremity edema despite taking home torsemide. During triage was found to be hypoxic and placed on BiPAP.  He was initially treated with Lasix, Solu-Medrol and bronchodilators.  His respiratory status deteriorated and he required intubation and mechanical ventilation. The patient underwent tracheostomy by Dr. Richardson Landry on 8/8.  Recent echocardiogram showed EF of 40 to 45%.  He developed fever postoperatively and infectious disease consultation was obtained and the patient was started on Zosyn. Treated for MRSA pneumonia with linezolid.  ENT was reconsulted for purulent nasal drainage and protrusion of tongue.  He underwent diagnostic nasal endoscopy.  Tongue laceration noted and Peridex ordered.  Prevotella sinusitis treated Unasyn.  He had gradual improvement in ventilatory requirements.  He underwent gastrostomy tube placement on 8/25.  He was transitioned to trach collar on 8/30.  Passy-Muir valve and diet advancement on 8/31. The patient was admitted to CIR on 9/18.  Had some combination of pneumonia and fluid overload requiring prolonged intubation during previous hospitalization, HD initiation, tracheostomy.  He has improved markedly and is now in inpatient rehab off HD.  Using PMV during day, TC at night.  Has diagnosis of OSA but intolerant to CPAP.  PCCM consulted for trach management.  Pertinent  Medical History  DM2 MRSA HCAP resolved Renal failure resolved Profound metabolic encephalopathy resolved Systolic heart failure   Significant Hospital Events: Including procedures,  antibiotic start and stop dates in addition to other pertinent events   9/18 admit to inpatient rehab  Interim History / Subjective:  Patient had just finished working with OT. Denies any shortness of breath, chest pain, cough. Feeling improved.   Objective   Blood pressure (!) 143/77, pulse 88, temperature 98.5 F (36.9 C), temperature source Oral, resp. rate 17, height 5\' 9"  (1.753 m), weight (!) 156.5 kg, SpO2 97 %.    FiO2 (%):  [28 %] 28 %   Intake/Output Summary (Last 24 hours) at 04/09/2022 0920 Last data filed at 04/09/2022 7026 Gross per 24 hour  Intake 833 ml  Output 2160 ml  Net -1327 ml   Filed Weights   04/07/22 1438 04/08/22 0500 04/09/22 0519  Weight: (!) 167.8 kg (!) 151.9 kg (!) 156.5 kg    Examination: General: Middle aged obese male, NAD.  HENT: [place]/AT, eyes anicteric. Trach in place Lungs: CTAB, normal effort on 2 L [place]. Trach Cardiovascular: RRR, no murmurs Abdomen: Soft, nontender, nondistended. G tube. Extremities: Warm, dry. Trace lower extremity edema Neuro: A&Ox4, no focal deficit  Resolved Hospital Problem list     Assessment & Plan:  Acute on chronic respiratory failure Trach  OSA/OHS Tracheostomy placed (8/8) due to prolonged stay from pneumonia and volume overload from previous admission. Does have some OSA, but intolerant to CPAP in the past. Trach capped yesterday and patient has been satting >90% on 2 L [place].  - Potential trach decannulation  - Duoneb q6h prn - Robitussin q6h prn   Best Practice (right click and "Reselect all SmartList Selections" daily)   Diet/type: Regular consistency (see orders) DVT prophylaxis: DOAC GI prophylaxis: N/A Lines: N/A Foley:  N/A Code Status:  full code Last date of multidisciplinary goals of care discussion [updated pt at bedside 9/20]  Labs   CBC: Recent Labs  Lab 04/07/22 1549  WBC 7.1  NEUTROABS 5.0  HGB 9.8*  HCT 31.5*  MCV 89.7  PLT 401    Basic Metabolic Panel: Recent Labs  Lab  04/04/22 0400 04/05/22 0453 04/08/22 0521  NA 139  --  136  K 3.8  --  3.7  CL 100  --  100  CO2 30  --  27  GLUCOSE 137*  --  106*  BUN 8  --  9  CREATININE 0.67  --  0.60*  CALCIUM 8.3*  --  8.9  MG  --  1.5*  --    GFR: Estimated Creatinine Clearance: 175 mL/min (A) (by C-G formula based on SCr of 0.6 mg/dL (L)). Recent Labs  Lab 04/07/22 1549  WBC 7.1    Liver Function Tests: Recent Labs  Lab 04/08/22 0521  AST 25  ALT 41  ALKPHOS 157*  BILITOT 0.2*  PROT 7.0  ALBUMIN 3.0*   No results for input(s): "LIPASE", "AMYLASE" in the last 168 hours. No results for input(s): "AMMONIA" in the last 168 hours.  ABG    Component Value Date/Time   PHART 7.34 (L) 03/04/2022 0900   PCO2ART 46 03/04/2022 0900   PO2ART 84 03/04/2022 0900   HCO3 24.8 03/04/2022 0900   TCO2 26 03/17/2007 1822   ACIDBASEDEF 1.3 03/04/2022 0900   O2SAT 95.9 03/03/2022 1043     Coagulation Profile: No results for input(s): "INR", "PROTIME" in the last 168 hours.  Cardiac Enzymes: No results for input(s): "CKTOTAL", "CKMB", "CKMBINDEX", "TROPONINI" in the last 168 hours.  HbA1C: Hgb A1c MFr Bld  Date/Time Value Ref Range Status  02/11/2022 10:29 AM 10.1 (H) 4.8 - 5.6 % Final    Comment:    (NOTE) Pre diabetes:          5.7%-6.4%  Diabetes:              >6.4%  Glycemic control for   <7.0% adults with diabetes     CBG: Recent Labs  Lab 04/08/22 0626 04/08/22 1123 04/08/22 1637 04/08/22 2103 04/09/22 0638  GLUCAP 105* 129* 148* 140* 128*    Review of Systems:   Review of Systems  Constitutional:  Negative for chills and fever.  Respiratory:  Negative for cough, sputum production, shortness of breath and wheezing.   Cardiovascular:  Negative for chest pain and leg swelling.  Gastrointestinal:  Negative for diarrhea, nausea and vomiting.    Past Medical History:  He,  has a past medical history of Diabetes mellitus without complication ([place]), Hypertension, and  Pancreatitis.   Surgical History:   Past Surgical History:  Procedure Laterality Date   CHOLECYSTECTOMY     TRACHEOSTOMY TUBE PLACEMENT N/A 02/25/2022   Procedure: TRACHEOSTOMY;  Surgeon: Clyde Canterbury, MD;  Location: [facility] ORS;  Service: ENT;  Laterality: N/A;     Social History:   reports that he has been smoking cigarettes. He has been smoking an average of 1 pack per day. He has never used smokeless tobacco. He reports that he does not drink alcohol and does not use drugs.   Family History:  His family history is not on file.   Allergies Allergies  Allergen Reactions   Hydrocodone Swelling     Home Medications  Prior to Admission medications   Medication Sig Start Date End Date Taking? Authorizing Provider  Aspirin-Salicylamide-Caffeine [facility] HEADACHE  POWDER PO) Take 1 packet by mouth as needed (for pain).    [provider]  cephALEXin (KEFLEX) 500 MG capsule Take 1 capsule (500 mg total) by mouth 4 (four) times daily. Patient not taking: Reported on 02/11/2022 12/16/18   Davonna Belling, MD  cyclobenzaprine (FLEXERIL) 10 MG tablet Take 0.5-1 tablets (5-10 mg total) by mouth 2 (two) times daily as needed for muscle spasms. Patient not taking: Reported on 02/11/2022 03/12/18   Margarita Mail, PA-C  gabapentin (NEURONTIN) 600 MG tablet Take 600 mg by mouth at bedtime. Patient not taking: Reported on 02/11/2022 10/13/21   [provider]  LABETALOL HCL PO Take by mouth. Patient not taking: Reported on 02/11/2022    [provider]  losartan (COZAAR) 100 MG tablet Take 100 mg by mouth daily. 02/01/22   [provider]  meloxicam (MOBIC) 15 MG tablet Take 1 tablet (15 mg total) by mouth daily. Take 1 daily with food. Patient not taking: Reported on 02/11/2022 03/12/18   Margarita Mail, PA-C  metFORMIN (GLUCOPHAGE) 500 MG tablet Take 500 mg by mouth 2 (two) times daily with a meal. Patient not taking: Reported on 02/11/2022    [provider]   METFORMIN HCL ER, MOD, PO Take by mouth. Patient not taking: Reported on 02/11/2022    [provider]  METOPROLOL SUCCINATE ER PO Take by mouth. Patient not taking: Reported on 02/11/2022    [provider]  oxyCODONE-acetaminophen (PERCOCET) 10-325 MG tablet Take 1 tablet by mouth See admin instructions. Take 1 tablet by mouth 4-5 times a day as needed for pain Patient not taking: Reported on 02/11/2022    [provider]  oxyCODONE-acetaminophen (PERCOCET/ROXICET) 5-325 MG tablet Take 1 tablet by mouth every 8 (eight) hours as needed for severe pain. Patient not taking: Reported on 02/11/2022 12/16/18   Davonna Belling, MD  OZEMPIC, 0.25 OR 0.5 MG/DOSE, 2 MG/3ML SOPN SMARTSIG:0.25 Milligram(s) SUB-Q Once a Week 12/09/21   [provider]  predniSONE (DELTASONE) 20 MG tablet Take 2 tablets (40 mg total) by mouth daily. Patient not taking: Reported on 02/11/2022 04/04/15   Davonna Belling, MD  torsemide (DEMADEX) 20 MG tablet Take 20 mg by mouth daily. 12/18/21   [provider]     Critical care time: N/A

## 2022-04-09 NOTE — Progress Notes (Addendum)
Occupational Therapy Session Note  Patient Details  Name: Samuel Chavez MRN: [id number] Date of Birth: 1976-06-30  Session 1 Today's Date: 04/09/2022 OT Individual Time: 4098-1191 OT Individual Time Calculation (min): 60 min   Session 2 Today's Date: 04/09/2022 OT Individual Time: 1515-1600 OT Individual Time Calculation (min): 45 min    Short Term Goals: Week 1:  OT Short Term Goal 1 (Week 1): Pt will don LB clothing with min A OT Short Term Goal 2 (Week 1): Pt will complete sit > stand with min A OT Short Term Goal 3 (Week 1): Pt will complete a stand pivot transfer with max A of 1 OT Short Term Goal 4 (Week 1): Pt will tolerate one minute of standing with no desaturation to demo improved activity tolerance  Skilled Therapeutic Interventions/Progress Updates:    Session 1 Pt received supine with no c/o pain. He completed bed mobility to EOB with mod A and cueing for technique. Pt completed UB Adls seated on the EOB with close (S). He requested privacy for anterior peri hygiene and was able to complete with a large posterior lean. He completed sit > stand from EOB with mod A to stand with heavy UE reliance on the RW. He required max A for posterior peri hygiene in standing. He only lasted 30 sec standing before needing to stand. He completed 4x more sit > stand with mod A to stand and heavy RW support with BUE. Activity performed to challenge functional activity tolerance and standing tolerance/balance. He completed 4 side steps to be closer to [facility] with mod A. He returned to supine with mod A to lift BLE. He was left supine with all needs met.     Session 2 Pt received supine with 8/10 pain in his back, reporting he has just had pain medication and it should kick in soon. Pt recently decannulated. Audible air flow, cueing for applying pressure when talking. He completed bed mobility to EOB with mod A, requiring cueing for technique to reduce strain through stomach and back. He  completed sit > stand from EOB with mod A and mod A for stand pivot to the w/c. He was taken to the therapy gym. Pt completes 2x10-15 dowel rod therex for BUE shoulder strengthening required for BADLs/functional transfers as follows with demo cuing and 4-7 lb dumbbells (graded up/down): Shoulder flex/ext, shoulder press, elbow flex/ext, chest press. He required cueing and intervention for correct muscle activation. He was left sitting up with all needs met.   Skilled monitoring of vitals throughout session to ensure cardiorespiratory stability. His vitals remained >94% on 2L O2 via [place].    Therapy Documentation Precautions:  Precautions Precautions: Fall Precaution Comments: PEG, trach with PMSV Restrictions Weight Bearing Restrictions: No Other Position/Activity Restrictions: HOB 30 deg- trach   Therapy/Group: Individual Therapy  Curtis Sites 04/09/2022, 6:43 AM

## 2022-04-09 NOTE — Patient Care Conference (Cosign Needed Addendum)
Inpatient RehabilitationTeam Conference and Plan of Care Update Date: 04/09/2022   Time: 12:11 PM    Patient Name: Samuel Chavez      Medical Record Number: [id number]  Date of Birth: Aug 04, 1975 Sex: Male         Room/Bed: 4W17C/4W17C-01 Payor Info: Payor: [place] / Plan:  MEDICAID HEALTHY BLUE / Product Type: *No Product type* /    Admit Date/Time:  04/07/2022  1:35 PM  Primary Diagnosis:  Critical illness myopathy  Hospital Problems: Principal Problem:   Critical illness myopathy Active Problems:   Type 2 diabetes mellitus without complication, without long-term current use of insulin ([place])   Respiratory failure ([place])   Neuropathic pain    Expected Discharge Date: Expected Discharge Date: 04/29/22  Team Members Present: Physician leading conference: Dr. Jennye Boroughs Social Worker Present: Ovidio Kin, LCSW Nurse Present: Dorien Chihuahua, RN PT Present: Barrie Folk, PT OT Present: Laverle Hobby, OT SLP Present: Weston Anna, SLP PPS Coordinator present : Gunnar Fusi, SLP     Current Status/Progress Goal Weekly Team Focus  Bowel/Bladder   continent of bowel and bladder  remain continent of bowel and bladder  offer toileting q 2 hours   Swallow/Nutrition/ Hydration   Regular textures with thin liquids, Mod I  No SLP goals needed  N/A   ADL's   Very deconditioned, (S) UB ADLs, max A LB ADLs, max A toileting  supervision to CGA  activity tolerance, transfers, ADLs, strengthening   Mobility   Mod/MaxA for transfers with HDRW; Mod/MaxA for gait up to 8' with close wc follow; MaxA for standing with HDRW; ModA for bed mobility- limited significantly by endurance/activity tolerance. Patient motivated to improve and at times has unrealistic idea of his abilities, whether that's too high or too low. Adequate LE strength- should progress well.  Supv/CGA  Endurance/activity tolerance, transfers, gait, standing tolerance, B LE strengthening    Communication   #6 cuffless XL trach with PMSV during all waking hours, Mod I  No SLP goals needed  N/A   Safety/Cognition/ Behavioral Observations  Mod A  Supervision  complex problem solving and recall with use of compensatory strategies   Pain   pt reports 8 out of 10 pain  pain level >5  assess q shift and prn   Skin   stage 2 on sacrum  prevent further breakdown of skin by turning q 2 hours  assess 2 hours and per shift     Discharge Planning:  Home with daughter and other family members to assist-suppose to be moving to another place. Will try to confirm time line for this. Pt is very motivated to regain his independence   Team Discussion: Patient deconditioned post critical illness myopathy. Anticipate PEG tube remains in place for another month.  Patient on target to meet rehab goals: yes, currently needs supervision for upper body care and mod - max assist for lower body and toileting. Progress limited by fatigue and sciatica; MD adjusted meds. Needs mod - max assist to ambulate 8' and max assist for standing balance. Goals for discharge set for supervision overall.  *See Care Plan and progress notes for long and short-term goals.   Revisions to Treatment Plan:  De cannulated 04/09/22   Teaching Needs: Safety, medications, dietary modification, secondary risk management, skin care, etc.  Current Barriers to Discharge: Decreased caregiver support and Home enviroment access/layout  Possible Resolutions to Barriers: Family education DME:W/C, HD-RW [facility] follow up services  Medical Summary Current Status: CIM and respiratory failure, pain , DM, dysphagia, low albumin  Barriers to Discharge: Home enviroment access/layout;Medical stability;Weight  Barriers to Discharge Comments: CIM and respiratory failure, pain , DM, dysphagia, low albumin Possible Resolutions to [employer] Focus: pulm consult, montior CBG, monitor intake, adjust gabapentin   Continued Need for  Acute Rehabilitation Level of Care: The patient requires daily medical management by a physician with specialized training in physical medicine and rehabilitation for the following reasons: Direction of a multidisciplinary physical rehabilitation program to maximize functional independence : Yes Medical management of patient stability for increased activity during participation in an intensive rehabilitation regime.: Yes Analysis of laboratory values and/or radiology reports with any subsequent need for medication adjustment and/or medical intervention. : Yes   I attest that I was present, lead the team conference, and concur with the assessment and plan of the team.   Dorien Chihuahua B 04/09/2022, 3:54 PM

## 2022-04-09 NOTE — Progress Notes (Signed)
Patient ID: Samuel Chavez, male   DOB: 07-20-1976, 46 y.o.   MRN: [id number]  Met with pt and son who is present in his room to update him regarding team conference goals of supervision-CGA level and target discharge date of 10/10. He feels this sounds long but need to get his legs stronger and moving. Discussed can move up discharge date if he progresses more quickly. He reports they are looking for another place to live but do not have a place currently will let PT know to work on two steps with him also. Will continue to work on discharge needs.

## 2022-04-09 NOTE — Progress Notes (Signed)
Trach removed per MD order. No complications noted. Site cleaned and gauzes applied to stoma and secured with tape. PT is able to vocalize. Sp02 is 95%, HR 83, 20, BS clear.

## 2022-04-09 NOTE — Progress Notes (Signed)
PROGRESS NOTE   Subjective/Complaints: Seen by pulmonary last night.  He was changed to 4-0 uncuffed shiley and caped, possible decannulation today or Friday.  Review of Systems  Constitutional:  Negative for chills and fever.  Eyes:  Negative for double vision.  Respiratory:  Negative for cough and shortness of breath.   Cardiovascular:  Negative for chest pain and palpitations.  Gastrointestinal:  Negative for abdominal pain.  Genitourinary:  Negative for urgency.  Neurological:  Positive for weakness. Negative for sensory change and speech change.     Objective:   No results found. Recent Labs    04/07/22 1549  WBC 7.1  HGB 9.8*  HCT 31.5*  PLT 292    Recent Labs    04/08/22 0521  NA 136  K 3.7  CL 100  CO2 27  GLUCOSE 106*  BUN 9  CREATININE 0.60*  CALCIUM 8.9     Intake/Output Summary (Last 24 hours) at 04/09/2022 0808 Last data filed at 04/09/2022 4818 Gross per 24 hour  Intake 950 ml  Output 2160 ml  Net -1210 ml      Pressure Injury 03/08/22 Buttocks Left Stage 3 -  Full thickness tissue loss. Subcutaneous fat may be visible but bone, tendon or muscle are NOT exposed. open, red/pink and yellow; WOC updated Staging to Stage 3 03/10/22 (Active)  03/08/22 1757  Location: Buttocks  Location Orientation: Left  Staging: Stage 3 -  Full thickness tissue loss. Subcutaneous fat may be visible but bone, tendon or muscle are NOT exposed.  Wound Description (Comments): open, red/pink and yellow; WOC updated Staging to Stage 3 03/10/22  Present on Admission: No    Physical Exam: Vital Signs Blood pressure (!) 143/77, pulse 88, temperature 98.5 F (36.9 C), temperature source Oral, resp. rate 17, height 5\' 9"  (1.753 m), weight (!) 156.5 kg, SpO2 100 %.  General: Morbidly obese AAM, No acute distress. Sitting in bed Mood and affect are appropriate Heart: Regular rate and rhythm no rubs murmurs or extra  sounds Lungs: decreased breath sounds due to body habitus, breathing unlabored, no rales or wheezes, trach with trach collar in place Abdomen: Positive bowel sounds, soft nontender to palpation, nondistended, PEG in place Extremities: No clubbing, cyanosis, or edema Skin: No evidence of breakdown, no evidence of rash Neurologic: Cranial nerves II through XII intact, motor strength is 5/5 in bilateral deltoid, bicep, tricep, grip, 3-hip flexor, 3- knee extensors,4/5 ankle dorsiflexor and plantar flexor Sensory exam normal sensation to light touch  in bilateral upper and lower extremities   Musculoskeletal: Full range of motion in all 4 extremities. No joint swelling  Assessment/Plan: 1. Functional deficits which require 3+ hours per day of interdisciplinary therapy in a comprehensive inpatient rehab setting. Physiatrist is providing close team supervision and 24 hour management of active medical problems listed below. Physiatrist and rehab team continue to assess barriers to discharge/monitor patient progress toward functional and medical goals  Care Tool:  Bathing    Body parts bathed by patient: Right arm, Left arm, Abdomen, Front perineal area, Chest, Right upper leg, Left upper leg, Right lower leg, Face   Body parts bathed by helper: Buttocks  Bathing assist Assist Level: Moderate Assistance - Patient 50 - 74%     Upper Body Dressing/Undressing Upper body dressing   What is the patient wearing?: Hospital gown only    Upper body assist Assist Level: Moderate Assistance - Patient 50 - 74%    Lower Body Dressing/Undressing Lower body dressing      What is the patient wearing?: Pants     Lower body assist Assist for lower body dressing: Moderate Assistance - Patient 50 - 74%     Toileting Toileting    Toileting assist Assist for toileting: 2 Helpers     Transfers Chair/bed transfer  Transfers assist     Chair/bed transfer assist level: 2 Helpers      Locomotion Ambulation   Ambulation assist      Assist level: Maximal Assistance - Patient 25 - 49% Assistive device:  (HDRW) Max distance: 8'   Walk 10 feet activity   Assist  Walk 10 feet activity did not occur: Safety/medical concerns (Unable to assess gait, curb, stair mobility and picking up an item from the floor at this time secondary to impaired endurance/activity tolerance and decreased global strength)        Walk 50 feet activity   Assist Walk 50 feet with 2 turns activity did not occur: Safety/medical concerns         Walk 150 feet activity   Assist Walk 150 feet activity did not occur: Safety/medical concerns         Walk 10 feet on uneven surface  activity   Assist Walk 10 feet on uneven surfaces activity did not occur: Safety/medical concerns         Wheelchair     Assist Is the patient using a wheelchair?: Yes Type of Wheelchair: Manual    Wheelchair assist level: Total Assistance - Patient < 25% Max wheelchair distance: Patient propelled manual wheelchair ~15-20' and therapist completed the remaining distance with total assist    Wheelchair 50 feet with 2 turns activity    Assist        Assist Level: Maximal Assistance - Patient 25 - 49%   Wheelchair 150 feet activity     Assist      Assist Level: Total Assistance - Patient < 25%   Blood pressure (!) 143/77, pulse 88, temperature 98.5 F (36.9 C), temperature source Oral, resp. rate 17, height 5\' 9"  (1.753 m), weight (!) 156.5 kg, SpO2 100 %.    Medical Problem List and Plan: 1. 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  Antithrombotics: -DVT/anticoagulation:  Pharmaceutical: Eliquis             -antiplatelet therapy: none 3. Pain Management: Tylenol as needed             -Neurontin BID, Lidoderm daily;  oxycodone and Flexeril prn for sciatica and neuropathy  9/20 Gabapentin 600mg  BID increased to 600mg  TID 4. Mood/Behavior/Sleep: LCSW to evaluate and provide emotional support             -antipsychotic agents: n/a             -depression: continue Lexapro  -Ativan PRN anxiety 5. Neuropsych/cognition: This patient is capable of making decisions on his own behalf. 6. Skin/Wound Care: Routine skin care  checks             -right ischial DTI             -routine trach and PEG care 7. 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              -continue Duo neb prn  -9/20 pulm consult- possible decannulation today or friday 9: Heart failure with preserved EF:             Continue Coreg 6.25 mg BID             -daily weight Filed Weights   04/07/22 1438 04/08/22 0500 04/09/22 0519  Weight: (!) 167.8 kg (!) 151.9 kg (!) 156.5 kg   10: Left ear wax: continue Debrox BID 11: Morbid obesity: BMI 54.64; dietary counseling 12: LLE sciatica: continue Neurontin and Flexeril 13: DM: CBGs 4 times daily             -continue Levemir 22 units BID             -SSI  -9/20 continues to be well controlled, continue current medications 14: Decreased serum magnesium:   -Recheck with next BMET 15: Anemia:   -HGB improved to 9.8 16: RUE brachial DVT secondary to PICC: on Eliquis 17: Vitamin D deficiency: continue supplementation 18: Left knee pain: continue Voltaren gel, Lidoderm 19: Tobacco use: Cessation counseling 20.  Dysphagia improved s/p PEG 8/25, cannot remove earlier than 04/25/22, pt now eating regular diet  21. Low Albumin 3.0 on 9/19, encourage protein intake       LOS: 2 days A FACE TO FACE EVALUATION WAS  PERFORMED  Jennye Boroughs 04/09/2022, 8:08 AM

## 2022-04-09 NOTE — Progress Notes (Signed)
Physical Therapy Session Note  Patient Details  Name: Samuel Chavez MRN: [id number] Date of Birth: 01/16/1976  Today's Date: 04/09/2022 PT Individual Time: 1423-2009 PT Individual Time Calculation (min): 38 min   Short Term Goals: Week 1:  PT Short Term Goal 1 (Week 1): Patient will complete bed mobility with MinA PT Short Term Goal 2 (Week 1): Patient will perform stand pivot transfer with HDRW and MinA PT Short Term Goal 3 (Week 1): Patient will ambulate 39' with HDRW and MinA   Skilled Therapeutic Interventions/Progress Updates:  Pt received supine in bed and agreeable to PT at bed level due to severe LPB and referred pain into LLE from sciatica.   Supine therex:  LAQ x 10 with AROM SLR x 8 with AAROM Heel slides x 10 BLE  Ankle PF x 15 with green tband  Hip abduction manual resistance x 10 Hip adduction isometric x 10 with 3 sec hold Hip flexion AAROM x 8  Hip extension with manual resistance x 8   Therapeutic rest breaks between bouts with cues for pursed lip breathing for pain management. Pt left supine in bed with call bell in reach and all needs met.       Therapy Documentation Precautions:  Precautions Precautions: Fall Precaution Comments: PEG, trach with PMSV Restrictions Weight Bearing Restrictions: No Other Position/Activity Restrictions: HOB 30 deg- trach    Vital Signs: Oxygen Therapy SpO2: 97 % O2 Device: Nasal Cannula Pain: Pain Assessment Pain Scale: 0-10 Pain Score: 10-Worst pain ever Pain Type: Chronic pain Pain Location: Back Pain Orientation: Lower Pain Descriptors / Indicators: Shooting (into LLE) Pain Onset: On-going Pain Intervention(s): Medication (See eMAR)    Therapy/Group: Individual Therapy  Lorie Phenix 04/09/2022, 11:18 AM

## 2022-04-09 NOTE — Progress Notes (Addendum)
Speech Language Pathology Daily Session Note  Patient Details  Name: Samuel Chavez MRN: [id number] Date of Birth: [birth year]  Today's Date: 04/09/2022 SLP Individual Time: 5956-3875 SLP Individual Time Calculation (min): 42 min  Short Term Goals: Week 1: SLP Short Term Goal 1 (Week 1): Patient will demonstrate complex problem solving for functional tasks with Min A verbal and visual cues. SLP Short Term Goal 2 (Week 1): Patient will recall new, daily information with use of external aids with Mod verbal and visual cues.  Skilled Therapeutic Interventions: Skilled ST treatment focused on cognitive goals. Pt was greeted supine in bed on arrival and accompanied by youngest son, Samuel Chavez. Session was limited due to toileting needs and pt's prolonged attempt to use bed pan. Pt was able to void bladder in urinal, unable to void bowel. Lurline Idol was decannulated by RT this afternoon. Sp02 was 93%, HR 84. Pt endorsed mild soreness near stoma. Pt exhibited minimal air leakage from stoma but with vocal quality that was perceived as clear and with adequate intensity at the conversational level. Pt participated in functional discussion regarding ST evaluation and POC. Pt continues to endorse memory needs and overall cognitive "fogginess" which feels to be improving gradually. Pt uplifted by son's presence. Pt recalled previous events from therapy with sup A question cues for thoroughness. Patient was left in bed with alarm activated and immediate needs within reach at end of session. Continue per current plan of care.      Pain Pain Assessment Pain Scale: 0-10 Pain Score: 10-Worst pain ever Pain Type: Chronic pain Pain Location: Back Pain Orientation: Lower Pain Descriptors / Indicators: Shooting (into LLE) Pain Onset: On-going Pain Intervention(s): Medication (See eMAR)  Therapy/Group: Individual Therapy  Samuel Chavez 04/09/2022, 1:27 PM

## 2022-04-10 DIAGNOSIS — K5900 Constipation, unspecified: Secondary | ICD-10-CM

## 2022-04-10 LAB — GLUCOSE, CAPILLARY
Glucose-Capillary: 115 mg/dL — ABNORMAL HIGH (ref 70–99)
Glucose-Capillary: 129 mg/dL — ABNORMAL HIGH (ref 70–99)
Glucose-Capillary: 139 mg/dL — ABNORMAL HIGH (ref 70–99)
Glucose-Capillary: 146 mg/dL — ABNORMAL HIGH (ref 70–99)

## 2022-04-10 MED ORDER — MAGNESIUM HYDROXIDE 400 MG/5ML PO SUSP
30.0000 mL | Freq: Once | ORAL | Status: DC
  Filled 2022-04-10: qty 30

## 2022-04-10 NOTE — Progress Notes (Signed)
Physical Therapy Session Note  Patient Details  Name: Samuel Chavez MRN: [id number] Date of Birth: Jan 07, 1976  Today's Date: 04/10/2022 PT Individual Time:  Short Term Goals: Week 1:  PT Short Term Goal 1 (Week 1): Patient will complete bed mobility with MinA PT Short Term Goal 2 (Week 1): Patient will perform stand pivot transfer with HDRW and MinA PT Short Term Goal 3 (Week 1): Patient will ambulate 25' with HDRW and MinA    Skilled Therapeutic Interventions/Progress Updates:   Pt received supine in bed. Pt unable to remain aroused for >10sec. MD present in room and recommends waiting until pt in more alert to perform PT treatment.  Will re-attempt at later time/date. Provided pt in medically stable.       Therapy Documentation Precautions:  Precautions Precautions: Fall Precaution Comments: PEG, trach with PMSV Restrictions Weight Bearing Restrictions: No Other Position/Activity Restrictions: HOB 30 deg- trach General: PT Amount of Missed Time (min): 60 Minutes PT Missed Treatment Reason: Patient fatigue; Pain: Pain Assessment Pain Scale: 0-10 Pain Score: 4      Therapy/Group: Individual Therapy  Lorie Phenix 04/10/2022, 9:20 AM

## 2022-04-10 NOTE — Progress Notes (Signed)
Speech Language Pathology Daily Session Note  Patient Details  Name: Samuel Chavez MRN: [id number] Date of Birth: [birth year]  Today's Date: 04/10/2022 SLP Individual Time: 1315-1345 SLP Individual Time Calculation (min): 30 min and Today's Date: 04/10/2022 SLP Missed Time: 15 Minutes Missed Time Reason: Other (Comment) (late lunch tray delivered - pt requesting to consume lunch in private prior to SLP tx; visitors interferring with focus on session - kindly addressed with pt's daughter through educating on pt's therapy schedule)  Short Term Goals: Week 1: SLP Short Term Goal 1 (Week 1): Patient will demonstrate complex problem solving for functional tasks with Min A verbal and visual cues. SLP Short Term Goal 2 (Week 1): Patient will recall new, daily information with use of external aids with Mod verbal and visual cues.  Skilled Therapeutic Interventions: Skilled ST treatment focused on cognitive goals. Pt received supine in bed, alert, and in pleasant mood. Lunch tray had just been delivered and pt requested additional time to consume lunch with privacy. SLP arrived back in 15 minutes and lunch had still been untouched and daughter, son, and granddaughter were present at bedside. Pt focusing all attention to granddaughter who appeared to be a toddler. Visitors appeared to interfere with session due to increased distractibility and side conversation. Kindly discussed to either call pt daily or have pt call daughter to determine therapy schedule for the day in effort to reduce visitors from interfering with pt's therapy schedule. Daughter and pt verbalized understanding. Daughter had several questions pertaining to pt's care, as well as speech therapy services. SLP addressed questions pertaining to decannulated trach, PEG, and reviewed SLP POC. Medical questions pertaining to stoma and PEG to be deferred to MD/medical team. Daughter verbalized understanding through teach back. Pt missed 15 minutes of  skilled ST intervention due to the above mentioned reasons. Patient was left in bed with alarm activated and immediate needs within reach at end of session. Continue per current plan of care.      Pain  None/denied  Therapy/Group: Individual Therapy  Patty Sermons 04/10/2022, 1:45 PM

## 2022-04-10 NOTE — Discharge Instructions (Addendum)
Inpatient Rehab Discharge Instructions  Samuel Chavez Discharge date and time:   Activities/Precautions/ Functional Status: Activity: no lifting, driving, or strenuous exercise until cleared by MD Diet: diabetic diet Wound Care: none needed Functional status:  ___ No restrictions     ___ Walk up steps independently ___ 24/7 supervision/assistance   ___ Walk up steps with assistance __x_ Intermittent supervision/assistance  ___ Bathe/dress independently ___ Walk with walker     ___ Bathe/dress with assistance ___ Walk Independently    ___ Shower independently ___ Walk with assistance    _x__ Shower with assistance __x_ No alcohol     ___ Return to work/school ________  Special Instructions: No driving, alcohol consumption or tobacco use.    COMMUNITY REFERRALS UPON DISCHARGE:     Outpatient: PT & OT             Agency:[facility] ADDRESS: [place] [id number] Phone: [phone]             Appointment Date/Time: WILL ALL YOU TO SET UP APPOINTMENTS  Medical Equipment/Items Ordered: BARIATRIC ROLLING WALKER, BARIATRIC BEDSIDE COMMODE AND [place]                                                 Agency/Supplier:[facility]  [phone]          My questions have been answered and I understand these instructions. I will adhere to these goals and the provided educational materials after my discharge from the hospital.  Patient/Caregiver Signature _______________________________ Date __________  Clinician Signature _______________________________________ Date __________  Please bring this form and your medication list with you to all your follow-up doctor's appointments.

## 2022-04-10 NOTE — Progress Notes (Signed)
PROGRESS NOTE   Subjective/Complaints: Trach removed yesterday. No new concerns this AM.  Reports LBM yesterday.  Review of Systems  Constitutional:  Negative for chills and fever.  Eyes:  Negative for double vision.  Respiratory:  Negative for cough and shortness of breath.   Cardiovascular:  Negative for chest pain and palpitations.  Gastrointestinal:  Negative for abdominal pain.  Genitourinary:  Negative for urgency.  Neurological:  Positive for weakness. Negative for sensory change and speech change.     Objective:   No results found. Recent Labs    04/07/22 1549  WBC 7.1  HGB 9.8*  HCT 31.5*  PLT 292    Recent Labs    04/08/22 0521  NA 136  K 3.7  CL 100  CO2 27  GLUCOSE 106*  BUN 9  CREATININE 0.60*  CALCIUM 8.9     Intake/Output Summary (Last 24 hours) at 04/10/2022 0820 Last data filed at 04/10/2022 0716 Gross per 24 hour  Intake 600 ml  Output 1400 ml  Net -800 ml      Pressure Injury 03/08/22 Buttocks Left Stage 3 -  Full thickness tissue loss. Subcutaneous fat may be visible but bone, tendon or muscle are NOT exposed. open, red/pink and yellow; WOC updated Staging to Stage 3 03/10/22 (Active)  03/08/22 1757  Location: Buttocks  Location Orientation: Left  Staging: Stage 3 -  Full thickness tissue loss. Subcutaneous fat may be visible but bone, tendon or muscle are NOT exposed.  Wound Description (Comments): open, red/pink and yellow; WOC updated Staging to Stage 3 03/10/22  Present on Admission: No    Physical Exam: Vital Signs Blood pressure (!) 140/93, pulse 87, temperature 98.1 F (36.7 C), temperature source Oral, resp. rate 16, height 5\' 9"  (1.753 m), weight (!) 157.4 kg, SpO2 97 %.  General: Morbidly obese AAM, No acute distress.  Mood and affect are appropriate Heart: Regular rate and rhythm no rubs murmurs or extra sounds Lungs: decreased breath sounds due to body habitus,  breathing unlabored, no rales or wheezes, trach has been removed -dressing CDI Abdomen: Positive bowel sounds, soft nontender to palpation, nondistended, PEG in place Extremities: No clubbing, cyanosis, or edema Skin: No evidence of breakdown, no evidence of rash Neurologic: Drowsy today, Cranial nerves II through XII intact, motor strength is 5/5 in bilateral deltoid, bicep, tricep, grip, 3-hip flexor, 3- knee extensors,4/5 ankle dorsiflexor and plantar flexor Sensory exam normal sensation to light touch  in bilateral upper and lower extremities   Musculoskeletal: Full range of motion in all 4 extremities. No joint swelling  Assessment/Plan: 1. Functional deficits which require 3+ hours per day of interdisciplinary therapy in a comprehensive inpatient rehab setting. Physiatrist is providing close team supervision and 24 hour management of active medical problems listed below. Physiatrist and rehab team continue to assess barriers to discharge/monitor patient progress toward functional and medical goals  Care Tool:  Bathing    Body parts bathed by patient: Right arm, Left arm, Abdomen, Front perineal area, Chest, Right upper leg, Left upper leg, Right lower leg, Face   Body parts bathed by helper: Buttocks     Bathing assist Assist Level: Moderate Assistance -  Patient 50 - 74%     Upper Body Dressing/Undressing Upper body dressing   What is the patient wearing?: Hospital gown only    Upper body assist Assist Level: Moderate Assistance - Patient 50 - 74%    Lower Body Dressing/Undressing Lower body dressing      What is the patient wearing?: Pants     Lower body assist Assist for lower body dressing: Moderate Assistance - Patient 50 - 74%     Toileting Toileting    Toileting assist Assist for toileting: 2 Helpers     Transfers Chair/bed transfer  Transfers assist     Chair/bed transfer assist level: 2 Helpers     Locomotion Ambulation   Ambulation  assist      Assist level: Maximal Assistance - Patient 25 - 49% Assistive device:  (HDRW) Max distance: 8'   Walk 10 feet activity   Assist  Walk 10 feet activity did not occur: Safety/medical concerns (Unable to assess gait, curb, stair mobility and picking up an item from the floor at this time secondary to impaired endurance/activity tolerance and decreased global strength)        Walk 50 feet activity   Assist Walk 50 feet with 2 turns activity did not occur: Safety/medical concerns         Walk 150 feet activity   Assist Walk 150 feet activity did not occur: Safety/medical concerns         Walk 10 feet on uneven surface  activity   Assist Walk 10 feet on uneven surfaces activity did not occur: Safety/medical concerns         Wheelchair     Assist Is the patient using a wheelchair?: Yes Type of Wheelchair: Manual    Wheelchair assist level: Total Assistance - Patient < 25% Max wheelchair distance: Patient propelled manual wheelchair ~15-20' and therapist completed the remaining distance with total assist    Wheelchair 50 feet with 2 turns activity    Assist        Assist Level: Maximal Assistance - Patient 25 - 49%   Wheelchair 150 feet activity     Assist      Assist Level: Total Assistance - Patient < 25%   Blood pressure (!) 140/93, pulse 87, temperature 98.1 F (36.7 C), temperature source Oral, resp. rate 16, height 5\' 9"  (1.753 m), weight (!) 157.4 kg, SpO2 97 %.    Medical Problem List and Plan: 1. 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  Antithrombotics: -DVT/anticoagulation:  Pharmaceutical: Eliquis             -antiplatelet therapy: none 3. Pain Management: Tylenol as needed             -Neurontin BID, Lidoderm daily; oxycodone and Flexeril prn for sciatica and  neuropathy  9/20 Gabapentin 600mg  BID increased to 600mg  TID 4. Mood/Behavior/Sleep: LCSW to evaluate and provide emotional support             -antipsychotic agents: n/a             -depression: continue Lexapro  -Ativan PRN anxiety 5. Neuropsych/cognition: This patient is capable of making decisions on his own behalf. 6. Skin/Wound Care: Routine skin care checks             -  right ischial DTI             -routine trach and PEG care 7. 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              -continue Duo neb prn  -9/20 pulm consult- Decannulated, appreciate assistance  9: Heart failure with preserved EF:             Continue Coreg 6.25 mg BID             -daily weight  9/21 daily weight overall decreased from 167.8 kg on 9/18, continue to monitor Filed Weights   04/08/22 0500 04/09/22 0519 04/10/22 0500  Weight: (!) 151.9 kg (!) 156.5 kg (!) 157.4 kg   10: Left ear wax: continue Debrox BID 11: Morbid obesity: BMI 54.64; dietary counseling 12: LLE sciatica: continue Neurontin and Flexeril 13: DM: CBGs 4 times daily             -continue Levemir 22 units BID             -SSI  -9/21 well controlled overall, follow trend 14: Decreased serum magnesium:   -Recheck with next BMET 15: Anemia:   -HGB improved to 9.8 16: RUE brachial DVT secondary to PICC: on Eliquis 17: Vitamin D deficiency: continue supplementation 18: Left knee pain: continue Voltaren gel, Lidoderm 19: Tobacco use: Cessation counseling 20.  Dysphagia improved s/p PEG 8/25, cannot remove earlier than 04/25/22, pt now eating regular diet  21. Low Albumin 3.0 on 9/19, encourage protein intake  22. Constipation LBM documented 9/15, he reports LBM  yesterday  -continue to follow      LOS: 3 days A FACE TO FACE EVALUATION WAS PERFORMED  Jennye Boroughs 04/10/2022, 8:20 AM

## 2022-04-10 NOTE — Progress Notes (Addendum)
Occupational Therapy Session Note  Patient Details  Name: Samuel Chavez MRN: [id number] Date of Birth: 1975-08-06  Today's Date: 04/10/2022 OT Individual Time: 1115-1200 OT Individual Time Calculation (min): 45 min    Short Term Goals: Week 1:  OT Short Term Goal 1 (Week 1): Pt will don LB clothing with min A OT Short Term Goal 2 (Week 1): Pt will complete sit > stand with min A OT Short Term Goal 3 (Week 1): Pt will complete a stand pivot transfer with max A of 1 OT Short Term Goal 4 (Week 1): Pt will tolerate one minute of standing with no desaturation to demo improved activity tolerance  Skilled Therapeutic Interventions/Progress Updates:  Pt seen for skilled OT session this am. Pt was asleep upon OT arrival but able to be aroused awake. Pt reported having "a rough night which has messed up my whole therapy morning". Reported less pain from earlier now 6/10 lower back after meds and rest. Pt reports he struggled 2x for trying to toilet after calling nurse call bell and having an extended wait. Son needed to assist and was unable to remove pt's pants in time and voided on himself and the sheets. Pt then required to use the toilet for BM and son had to assist to toilet in bathroom with pt feeling exhausted returning to bed and intermittently sleeping through AM therapy attempts. OT worked with pt on green (medium) tband for elbow extension/flexion 15 reps x 3 sets with breathing integration and rests. Pt able to complete UB self care including sponge bathing, oral care and UB dressing with education for energy conservation and pacing. UB bathing and dressing with mod A fading to min A and oral care with min A after set up. OT issued and trained in foam block squeezes for overall strengthening and CDP management. Pt educated to perform 20 reps Bly 4x per day with + teach back. OT spoke with RN Santiago Glad re: pt's concerns and will convey to team in case he was unable to during somnolence earlier as he  requested this communication to limit confusion/perception that he was just refusing. OT left pt with bed exit engaged, nurse call button in reach and all needs in place.   Therapy Documentation Precautions:  Precautions Precautions: Fall Precaution Comments: PEG, trach with PMSV Restrictions Weight Bearing Restrictions: No Other Position/Activity Restrictions: HOB 30 deg- trach    Therapy/Group: Individual Therapy  [name] 04/10/2022, 7:51 AM

## 2022-04-10 NOTE — IPOC Note (Signed)
Overall Plan of Care [facility]) Patient Details Name: Samuel Chavez MRN: [id number] DOB: January 08, 1976  Admitting Diagnosis: Critical illness myopathy  Hospital Problems: Principal Problem:   Critical illness myopathy Active Problems:   Type 2 diabetes mellitus without complication, without long-term current use of insulin (HCC)   Respiratory failure ([place])   Neuropathic pain     Functional Problem List: Nursing Bladder, Bowel, Medication Management, Safety, Nutrition, Endurance, Other (comment) (trach)  PT Balance, Pain, Perception, Safety, Endurance, Motor, Sensory, Skin Integrity, Nutrition  OT Balance, Cognition, Endurance, Motor, Pain, Safety, Skin Integrity  SLP Cognition  TR         Basic ADL's: OT Bathing, Dressing, Toileting     Advanced  ADL's: OT       Transfers: PT Bed Mobility, Bed to Chair, Teacher, early years/pre, Tub/Shower     Locomotion: PT Ambulation, Emergency planning/management officer, Stairs     Additional Impairments: OT None  SLP Social Cognition   Memory, Problem Solving  TR      Anticipated Outcomes Item Anticipated Outcome  Self Feeding no goal  Swallowing      Basic self-care  (S)  Toileting  (S)   Bathroom Transfers (S)  Bowel/Bladder  manage bowel w mod I and bladder w toileting  Transfers  Supv with HDRW  Locomotion  Supv for household gait ~50' with HDRW  Communication     Cognition  Supervision  Pain  n/a  Safety/Judgment  manage safety w cues   Therapy Plan: PT Intensity: Minimum of 1-2 x/day ,45 to 90 minutes PT Frequency: 5 out of 7 days PT Duration Estimated Length of Stay: 2-3 weeks OT Intensity: Minimum of 1-2 x/day, 45 to 90 minutes OT Frequency: 5 out of 7 days OT Duration/Estimated Length of Stay: 2.5-3 weeks SLP Intensity: Minumum of 1-2 x/day, 30 to 90 minutes SLP Frequency: 3 to 5 out of 7 days SLP Duration/Estimated Length of Stay: 2.5-3 weeks   Team Interventions: Nursing Interventions Bladder Management, Disease  Management/Prevention, Medication Management, Discharge Planning, Dysphagia/Aspiration Precaution Training, Bowel Management, Patient/Family Education  PT interventions Ambulation/gait training, Cognitive remediation/compensation, Discharge planning, DME/adaptive equipment instruction, Functional mobility training, Pain management, Psychosocial support, Splinting/orthotics, Therapeutic Activities, UE/LE Strength taining/ROM, Visual/perceptual remediation/compensation, Training and development officer, Community reintegration, Disease management/prevention, Neuromuscular re-education, Skin care/wound management, Barrister's clerk education, IT trainer, Therapeutic Exercise, UE/LE Coordination activities, Wheelchair propulsion/positioning  OT Interventions Training and development officer, Self Care/advanced ADL retraining, Therapeutic Exercise, Wheelchair propulsion/positioning, UE/LE Strength taining/ROM, Skin care/wound managment, Pain management, Cognitive remediation/compensation, DME/adaptive equipment instruction, Academic librarian, Barrister's clerk education, UE/LE Coordination activities, Discharge planning, Functional mobility training, Psychosocial support, Therapeutic Activities  SLP Interventions Cognitive remediation/compensation, Internal/external aids, Therapeutic Activities, Environmental controls, Cueing hierarchy, Functional tasks, Patient/family education  TR Interventions    SW/CM Interventions Discharge Planning, Psychosocial Support, Patient/Family Education   Barriers to Discharge MD  Medical stability, Home enviroment access/loayout, Trach, and Wound care  Nursing Decreased caregiver support, Home environment access/layout, Trach, Incontinence, Nutrition means 1 level3/4 ste rental home w daughter; [name], moving in a month from 04/07/22  PT Home environment access/layout, Therapist, nutritional, Insurance underwriter for SNF coverage, New oxygen    OT Inaccessible home environment stairs, reports it is a  trailer so question accessibility from w/c level  SLP      SW Insurance for SNF coverage, [employer]     Team Discharge Planning: Destination: PT-Home ,OT- Home , SLP-Home Projected Follow-up: PT-Home health PT, OT-  Home health OT, SLP-24 hour supervision/assistance, Home Health SLP, Outpatient SLP Projected Equipment Needs: PT-To be  determined, OT- To be determined, SLP- (PMSV) Equipment Details: PT-TBD, OT-  Patient/family involved in discharge planning: PT- Patient,  OT-Patient, SLP-Patient  MD ELOS: 3 weeks Medical Rehab Prognosis:  Good Assessment: The patient has been admitted for CIR therapies with the diagnosis of  critical illness myopathy and respiratory failure. The team will be addressing functional mobility, strength, stamina, balance, safety, adaptive techniques and equipment, self-care, bowel and bladder mgt, patient and caregiver education. Goals have been set at sup to [facility]. Anticipated discharge destination is home.       See Team Conference Notes for weekly updates to the plan of care

## 2022-04-10 NOTE — Discharge Summary (Signed)
Physician Discharge Summary   Patient: Samuel Chavez MRN: [id number] DOB: 02-16-1976  Admit date:     02/11/2022  Discharge date: 04/07/2022  Discharge Physician: Annita Brod   PCP: [facility]   Recommendations at discharge:   Patient being transferred to inpatient rehab at [facility] Patient will continue tracheostomy on room air  Discharge Diagnoses: Principal Problem:   Acute respiratory failure with hypoxia and hypercarbia (HCC) Active Problems:   Acute on chronic systolic CHF (congestive heart failure) ([place])   Hypertensive urgency   Type 2 diabetes mellitus without complication, without long-term current use of insulin (HCC)   Acute heart failure with preserved ejection fraction (HCC)   Renal failure (ARF), acute on chronic (HCC)   Hospital-acquired pneumonia   ARDS (adult respiratory distress syndrome) (HCC)   Pressure injury of skin   Constipation  Resolved Problems:   Respiratory failure [facility])  Hospital Course: 46 y.o. 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  Patient states he has been taking his medication without improvement and reports 30 pound weight gain.  Patient found to be significantly hypoxic and hypercarbic and placed on BiPAP.  Despite this, he had worsening hypercapnia and was intubated for airway protection.  Hospital course complicated by worsening renal failure and nephrology consulted.  At 1 point, he became oligoanuric requiring hemodialysis support.  Over time, renal function stabilized and hemodialysis no longer required.  Dialysis catheter removed and renal function normalized.  Acute on chronic hypoxemic and hypercapneic  respiratory failure: Patient had difficult time being able to be extubated off of ventilator.  Respiratory failure felt to multifactorial secondary to systolic heart failure, obesity hypoventilation syndrome and eventually patient underwent tracheostomy by ENT on 8/8.  Postop, developed fever and was treated for MRSA pneumonia.  Sinusitis treated.  Patient able to be transition to tracheostomy collar on 8/30.  Over time, patient has required less and less oxygenation support.  At this point now, he does not need oxygen support however given his narrow airway, he is at high risk for recurrent episodes of respiratory failure chronically and so for now needs to leave to be in.  Advised patient if he wishes to have tube he would benefit from weight loss and usage of CPAP consistently.   MRSA HCAP: Resolved patient was followed by infectious disease.  He was diagnosed with strep pneumo and H. influenzae respiratory infections and given ceftriaxone.  After his tracheostomy on 8/8, started developing fever with some leukocytosis.  Found to have MRSA pneumonia and Prevotella sinusitis.  Completed course of antibiotics.   Muffled hearing of left ear: Secondary to wax build up. S/p ear flushing but still w/ c/o muffled hearing. Continue w/ ear wax drops   AKI: Secondary to cardiorenal syndrome.  Required dialysis for period of time, resolved.   Hypernatremia: Resolved.  With poor p.o. intake as well as diuresis, patient developed hypernatremia.  Enteral water flushes and over time, sodium normalized   Constipation: lactulose prn    Hyperkalemia: resolved   Hypomagnesemia: Resolved  PEG tube placement: Done due to poor p.o. intake.  Initially assessed by intervention radiology but due to patient's morbid obesity, unable to be done by dimensional radiology.  General surgery consulted and PEG tube was placed.  Following  treatment with enteral feedings, patient started to be able to take p.o. and by time of  discharge, tolerating p.o. with adequate calorie intake and tube feeds were discontinued.  In the future, patient can follow-up with general surgery as outpatient had PEG tube removed.  Tobacco abuse: Patient had been smoking about a pack a day.  Has not gone several months without smoking and says he does not want to smoke again.   DM2: likely poorly controlled.  Initially with markedly elevated blood sugars requiring insulin drip.  Over time, this was able to be weaned off and patient was placed on Levemir plus sliding scale.     Normocytic anemia:  H&H are labile. No need for a transfusion currently    Severe toxic encephalopathy: Multifactorial secondary to respiratory failure with hypercarbia and hypoxia as well as infection.  As above.  Resolved.   Deconditioning with myopathy and polyneuropathy: Seen by physical medicine rehab and accepted for transfer on 9/18.   PICC associated DVT: PICC removed. Continue on eliquis   Depression: severity unknown. Continue on lexapro   Left leg pain: w/ hx of sciatica as per pt.  LLE [place] was neg for DVT. Continue on gabapentin, flexeril   Morbidly obese: Meets criteria BMI greater than 40, BMI actually greater than 50.   Acute on chronic systolic and diastolic heart failure: Initial echocardiogram on admission noted preserved ejection fraction.  Patient has had previous hospitalizations for heart failure and previous echocardiogram before admission showed lower EF of 30 to 35%.  Follow-up echocardiogram on 7/30 noted ejection fraction of 40 to 45%.  Cardiology consulted.  Underlying etiologies appear to be related to his obesity hypoventilation syndrome and obstructive sleep apnea.  By time of discharge, patient on oral torsemide.  Felt to be euvolemic at that point.  Outpatient cardiology follow-up.  Not felt to be a good candidate for cardiac catheterization.      Pain control - [employer] Controlled Substance Reporting System database was  reviewed. and patient was instructed, not to drive, operate heavy machinery, perform activities at heights, swimming or participation in water activities or provide baby-sitting services while on Pain, Sleep and Anxiety Medications; until their outpatient Physician has advised to do so again. Also recommended to not to take more than prescribed Pain, Sleep and Anxiety Medications.  Consultants:  -Physical medicine rehab -Anesthesiology -Nephrology -ENT -Radiology -Palliative Care -Neurology -Infectious disease -Cardiology -Vascular Surgery -Critical care -GEN surgery  Procedures performed:  -Placement of dialysis catheter and hemodialysis and subsequent removal of dialysis catheter -Echocardiogram done 7/25 noting grade 1 diastolic dysfunction and ejection fraction of 55 to 60% -Repeat echocardiogram done 7/30 noted ejection fraction of 40 to 45% -Tracheostomy 8/8 -PEG tube placement 8/27 -PICC line placement with subsequent removal -Arterial line placed 7/31 -Bronchoscopy done 8/4   Disposition: Transferred to inpatient rehab Diet recommendation:  Carb modified DISCHARGE MEDICATION: Allergies as of 04/07/2022       Reactions   Hydrocodone Swelling        Medication List     ASK your doctor about these medications    BC HEADACHE POWDER PO Take 1 packet by mouth as needed (for pain).   cephALEXin 500 MG capsule Commonly known as: KEFLEX Take 1 capsule (500 mg total) by mouth 4 (four) times daily.   cyclobenzaprine 10 MG tablet Commonly known as: FLEXERIL Take 0.5-1 tablets (5-10 mg total) by mouth 2 (two) times daily as needed for muscle spasms.   gabapentin 600 MG tablet  Commonly known as: NEURONTIN Take 600 mg by mouth at bedtime.   LABETALOL HCL PO Take by mouth.   losartan 100 MG tablet Commonly known as: COZAAR Take 100 mg by mouth daily.   meloxicam 15 MG tablet Commonly known as: Mobic Take 1 tablet (15 mg total) by mouth daily. Take 1 daily  with food.   METFORMIN HCL ER (MOD) PO Take by mouth.   metFORMIN 500 MG tablet Commonly known as: GLUCOPHAGE Take 500 mg by mouth 2 (two) times daily with a meal.   METOPROLOL SUCCINATE ER PO Take by mouth.   oxyCODONE-acetaminophen 10-325 MG tablet Commonly known as: PERCOCET Take 1 tablet by mouth See admin instructions. Take 1 tablet by mouth 4-5 times a day as needed for pain   oxyCODONE-acetaminophen 5-325 MG tablet Commonly known as: PERCOCET/ROXICET Take 1 tablet by mouth every 8 (eight) hours as needed for severe pain.   Ozempic (0.25 or 0.5 MG/DOSE) 2 MG/3ML Sopn Generic drug: Semaglutide(0.25 or 0.5MG /DOS) SMARTSIG:0.25 Milligram(s) SUB-Q Once a Week   predniSONE 20 MG tablet Commonly known as: DELTASONE Take 2 tablets (40 mg total) by mouth daily.   torsemide 20 MG tablet Commonly known as: DEMADEX Take 20 mg by mouth daily.        Discharge Exam: Filed Weights   03/28/22 0500 04/06/22 0425  Weight: (!) 177.8 kg (!) 167.8 kg   Alert and oriented x3, no acute distress HEENT: Tracheostomy in place, narrow airway Cardiovascular: Regular rate and rhythm, S1-S2 Lungs: Decreased breath sounds throughout secondary to body habitus  Condition at discharge: improving  The results of significant diagnostics from this hospitalization (including imaging, microbiology, ancillary and laboratory) are listed below for reference.   Imaging Studies: DG Knee 1-2 Views Left  Result Date: 03/20/2022 CLINICAL DATA:  Left knee pain. EXAM: LEFT KNEE - 1-2 VIEW COMPARISON:  None Available. FINDINGS: Small joint effusion is present. There is no evidence for acute fracture or dislocation. There is mild medial compartment joint space narrowing. There is tricompartmental osteophyte formation. Soft tissues are within normal limits. IMPRESSION: 1. Small joint effusion. 2. No acute fracture or dislocation. 3. Mild-to-moderate degenerative changes. Electronically Signed   By: Ronney Asters M.D.   On: 03/20/2022 21:06   DG HIP UNILAT WITH PELVIS 2-3 VIEWS LEFT  Result Date: 03/20/2022 CLINICAL DATA:  Left leg pain. EXAM: DG HIP (WITH OR WITHOUT PELVIS) 2-3V LEFT COMPARISON:  None Available. FINDINGS: There is no evidence of hip fracture or dislocation. There are minimal degenerative changes of both hips with sclerosis and osteophyte formation. Soft tissues are within normal limits. IMPRESSION: No acute bony abnormality. Minimal degenerative changes of the hips. Electronically Signed   By: Ronney Asters M.D.   On: 03/20/2022 20:30   US Venous Img Lower Unilateral Left (DVT)  Result Date: 03/20/2022 CLINICAL DATA:  46 year old male with left leg pain. EXAM: LEFT LOWER EXTREMITY VENOUS DOPPLER ULTRASOUND TECHNIQUE: Gray-scale sonography with graded compression, as well as color Doppler and duplex ultrasound were performed to evaluate the left lower extremity deep venous systems from the level of the common femoral vein and including the common femoral, femoral, profunda femoral, popliteal and calf veins including the posterior tibial, peroneal and gastrocnemius veins when visible. Spectral Doppler was utilized to evaluate flow at rest and with distal augmentation maneuvers in the common femoral, femoral and popliteal veins. The contralateral common femoral vein was also evaluated for comparison. COMPARISON:  None Available. FINDINGS: LEFT LOWER EXTREMITY Common Femoral Vein: No  evidence of thrombus. Normal compressibility, respiratory phasicity and response to augmentation. Central Greater Saphenous Vein: No evidence of thrombus. Normal compressibility and flow on color Doppler imaging. Central Profunda Femoral Vein: No evidence of thrombus. Normal compressibility and flow on color Doppler imaging. Femoral Vein: No evidence of thrombus. Normal compressibility, respiratory phasicity and response to augmentation. Popliteal Vein: No evidence of thrombus. Normal compressibility, respiratory  phasicity and response to augmentation. Calf Veins: No evidence of thrombus. Normal compressibility and flow on color Doppler imaging. Other Findings:  None. RIGHT LOWER EXTREMITY Common Femoral Vein: No evidence of thrombus. Normal compressibility, respiratory phasicity and response to augmentation. IMPRESSION: No evidence of left lower extremity deep venous thrombosis. Ruthann Cancer, MD [facility] Electronically Signed   By: Ruthann Cancer M.D.   On: 03/20/2022 15:15    Microbiology: Results for orders placed or performed during the hospital encounter of 02/11/22  SARS Coronavirus 2 by RT PCR (hospital order, performed in [facility] hospital lab) *cepheid single result test* Anterior Nasal Swab     Status: None   Collection Time: 02/11/22  4:33 PM   Specimen: Anterior Nasal Swab  Result Value Ref Range Status   SARS Coronavirus 2 by RT PCR NEGATIVE NEGATIVE Final    Comment: (NOTE) SARS-CoV-2 target nucleic acids are NOT DETECTED.  The SARS-CoV-2 RNA is generally detectable in upper and lower respiratory specimens during the acute phase of infection. The lowest concentration of SARS-CoV-2 viral copies this assay can detect is 250 copies / mL. A negative result does not preclude SARS-CoV-2 infection and should not be used as the sole basis for treatment or other patient management decisions.  A negative result may occur with improper specimen collection / handling, submission of specimen other than nasopharyngeal swab, presence of viral mutation(s) within the areas targeted by this assay, and inadequate number of viral copies (<250 copies / mL). A negative result must be combined with clinical observations, patient history, and epidemiological information.  Fact Sheet for Patients:   https://www.patel.info/  Fact Sheet for Healthcare Providers: https://hall.com/  This test is not yet  approved or  cleared by the [place] FDA and has been authorized for detection and/or diagnosis of SARS-CoV-2 by FDA under an Emergency Use Authorization (EUA).  This EUA will remain in effect (meaning this test can be used) for the duration of the COVID-19 declaration under Section 564(b)(1) of the Act, 21 U.S.C. section 360bbb-3(b)(1), unless the authorization is terminated or revoked sooner.  Performed at [facility], [address] [place], [place] [address]   MRSA Next Gen by PCR, Nasal     Status: None   Collection Time: 02/11/22  5:48 PM   Specimen: Nasal Mucosa; Nasal Swab  Result Value Ref Range Status   MRSA by PCR Next Gen NOT DETECTED NOT DETECTED Final    Comment: (NOTE) The GeneXpert MRSA Assay (FDA approved for NASAL specimens only), is one component of a comprehensive MRSA colonization surveillance program. It is not intended to diagnose MRSA infection nor to guide or monitor treatment for MRSA infections. Test performance is not FDA approved in patients less than 35 years old. Performed at [facility], [address] [place],  [address]   Culture, Respiratory w Gram Stain     Status: None   Collection Time: 02/13/22  9:16 PM   Specimen: Tracheal Aspirate; Respiratory  Result Value Ref Range Status   Specimen Description   Final    TRACHEAL  ASPIRATE Performed at [facility], [address], [place], [place] [address]    Special Requests   Final    NONE Performed at [place] [place], [place] [address]    Gram Stain   Final    NO SQUAMOUS EPITHELIAL CELLS SEEN FEW WBC SEEN FEW GRAM NEGATIVE RODS MODERATE GRAM POSITIVE COCCI    Culture   Final    ABUNDANT STREPTOCOCCUS PNEUMONIAE ABUNDANT HAEMOPHILUS INFLUENZAE BETA LACTAMASE NEGATIVE NO STAPHYLOCOCCUS AUREUS ISOLATED No Pseudomonas species isolated Performed at [facility], [place] [address], [place], [place] [address]     Report Status 02/17/2022 FINAL  Final   Organism ID, Bacteria STREPTOCOCCUS PNEUMONIAE  Final      Susceptibility   Streptococcus pneumoniae - MIC*    ERYTHROMYCIN <=0.12 SENSITIVE Sensitive     LEVOFLOXACIN 0.5 SENSITIVE Sensitive     VANCOMYCIN 0.5 SENSITIVE Sensitive     PENICILLIN (meningitis) <=0.06 SENSITIVE Sensitive     PENO - penicillin <=0.06      PENICILLIN (non-meningitis) <=0.06 SENSITIVE Sensitive     PENICILLIN (oral) <=0.06 SENSITIVE Sensitive     CEFTRIAXONE (non-meningitis) <=0.12 SENSITIVE Sensitive     CEFTRIAXONE (meningitis) <=0.12 SENSITIVE Sensitive     * ABUNDANT STREPTOCOCCUS PNEUMONIAE  Culture, Respiratory w Gram Stain     Status: None   Collection Time: 02/16/22  4:27 PM   Specimen: Tracheal Aspirate; Respiratory  Result Value Ref Range Status   Specimen Description   Final    TRACHEAL ASPIRATE Performed at [facility], [address] [place], [place] [address]    Special Requests   Final    NONE Performed at [facility], [address] [place], [place] [address]    Gram Stain   Final    RARE WBC PRESENT,BOTH PMN AND MONONUCLEAR NO ORGANISMS SEEN    Culture   Final    NO GROWTH Performed at [facility], [place] [address] [place], [place] [address]    Report Status 02/20/2022 FINAL  Final  Culture, Respiratory w Gram Stain     Status: None   Collection Time: 02/21/22 11:41 AM   Specimen: Bronchoalveolar Lavage; Respiratory  Result Value Ref Range Status   Specimen Description   Final    BRONCHIAL ALVEOLAR LAVAGE Performed at [facility], [address] [place], [place] [address]    Special Requests   Final    NONE Performed at [facility], [address] [place], [place] [address]    Gram Stain   Final    FEW WBC PRESENT,BOTH PMN AND MONONUCLEAR NO ORGANISMS SEEN    Culture   Final    NO GROWTH 2 DAYS Performed at [facility], [place] [address] [place], [place] [address]     Report Status 02/23/2022 FINAL  Final  Culture, Respiratory w Gram Stain     Status: None   Collection Time: 02/26/22  2:37 PM   Specimen: SPU; Respiratory  Result Value Ref Range Status   Specimen Description   Final    SPUTUM Performed at [facility], [address] [place], [place] [address]    Special Requests   Final    NONE Performed at [facility], [place], [place] [address]    Gram Stain   Final    FEW GRAM POSITIVE COCCI IN PAIRS IN CLUSTERS FEW WBC PRESENT, PREDOMINANTLY PMN Performed at [facility], [address] [place], [place] [address]    Culture FEW METHICILLIN RESISTANT STAPHYLOCOCCUS AUREUS  Final   Report Status 03/01/2022 FINAL  Final   Organism ID, Bacteria METHICILLIN RESISTANT STAPHYLOCOCCUS AUREUS  Final      Susceptibility   Methicillin resistant staphylococcus aureus - MIC*    CIPROFLOXACIN <=0.5 SENSITIVE Sensitive     ERYTHROMYCIN <=0.25 SENSITIVE Sensitive     GENTAMICIN <=0.5 SENSITIVE Sensitive     OXACILLIN >=4 RESISTANT Resistant     TETRACYCLINE <=1 SENSITIVE Sensitive     VANCOMYCIN 1 SENSITIVE Sensitive     TRIMETH/SULFA <=10 SENSITIVE Sensitive     CLINDAMYCIN <=0.25 SENSITIVE Sensitive     RIFAMPIN <=0.5 SENSITIVE Sensitive     Inducible Clindamycin NEGATIVE Sensitive     * FEW METHICILLIN RESISTANT STAPHYLOCOCCUS AUREUS  Respiratory (~20 pathogens) panel by PCR     Status: None   Collection Time: 02/27/22  4:37 PM   Specimen: Nasopharyngeal Swab; Respiratory  Result Value Ref Range Status   Adenovirus NOT DETECTED NOT DETECTED Final   Coronavirus 229E NOT DETECTED NOT DETECTED Final    Comment: (NOTE) The Coronavirus on the Respiratory Panel, DOES NOT test for the novel  Coronavirus (2019 nCoV)    Coronavirus HKU1 NOT DETECTED NOT DETECTED Final   Coronavirus NL63 NOT DETECTED NOT DETECTED Final   Coronavirus OC43 NOT DETECTED NOT DETECTED Final   Metapneumovirus NOT DETECTED NOT  DETECTED Final   Rhinovirus / Enterovirus NOT DETECTED NOT DETECTED Final   Influenza A NOT DETECTED NOT DETECTED Final   Influenza B NOT DETECTED NOT DETECTED Final   Parainfluenza Virus 1 NOT DETECTED NOT DETECTED Final   Parainfluenza Virus 2 NOT DETECTED NOT DETECTED Final   Parainfluenza Virus 3 NOT DETECTED NOT DETECTED Final   Parainfluenza Virus 4 NOT DETECTED NOT DETECTED Final   Respiratory Syncytial Virus NOT DETECTED NOT DETECTED Final   Bordetella pertussis NOT DETECTED NOT DETECTED Final   Bordetella Parapertussis NOT DETECTED NOT DETECTED Final   Chlamydophila pneumoniae NOT DETECTED NOT DETECTED Final   Mycoplasma pneumoniae NOT DETECTED NOT DETECTED Final    Comment: Performed at [facility], [place]. [address] [place], [place] [address]  Aerobic/Anaerobic Culture w Gram Stain (surgical/deep wound)     Status: None   Collection Time: 02/27/22  5:07 PM   Specimen: Wound  Result Value Ref Range Status   Specimen Description WOUND  Final   Special Requests RIGHT NASAL CAVITY  Final   Gram Stain   Final    MODERATE GRAM POSITIVE COCCI IN PAIRS RARE GRAM NEGATIVE RODS RARE WBC PRESENT,BOTH PMN AND MONONUCLEAR    Culture   Final    ABUNDANT METHICILLIN RESISTANT STAPHYLOCOCCUS AUREUS MODERATE PREVOTELLA MELANINOGENICA BETA LACTAMASE POSITIVE Performed at [facility], [place] [address] [place], [place] [address]    Report Status 03/02/2022 FINAL  Final   Organism ID, Bacteria METHICILLIN RESISTANT STAPHYLOCOCCUS AUREUS  Final      Susceptibility   Methicillin resistant staphylococcus aureus - MIC*    CIPROFLOXACIN <=0.5 SENSITIVE Sensitive     ERYTHROMYCIN <=0.25 SENSITIVE Sensitive     GENTAMICIN <=0.5 SENSITIVE Sensitive     OXACILLIN >=4 RESISTANT Resistant     TETRACYCLINE <=1 SENSITIVE Sensitive     VANCOMYCIN 1 SENSITIVE Sensitive     TRIMETH/SULFA <=10 SENSITIVE Sensitive     CLINDAMYCIN <=0.25 SENSITIVE Sensitive     RIFAMPIN <=0.5 SENSITIVE  Sensitive     Inducible Clindamycin NEGATIVE Sensitive     *  ABUNDANT METHICILLIN RESISTANT STAPHYLOCOCCUS AUREUS  MRSA Next Gen by PCR, Nasal     Status: Abnormal   Collection Time: 02/27/22  6:19 PM   Specimen: Nasal Mucosa; Nasal Swab  Result Value Ref Range Status   MRSA by PCR Next Gen DETECTED (A) NOT DETECTED Final    Comment: RESULT CALLED TO, READ BACK BY AND VERIFIED WITH: Romilda Joy DEW 02/27/22 1928 MU (NOTE) The GeneXpert MRSA Assay (FDA approved for NASAL specimens only), is one component of a comprehensive MRSA colonization surveillance program. It is not intended to diagnose MRSA infection nor to guide or monitor treatment for MRSA infections. Test performance is not FDA approved in patients less than 34 years old. Performed at [facility], [address] [place], [place] [address]   Culture, blood (Routine X 2) w Reflex to ID Panel     Status: Abnormal   Collection Time: 02/28/22  7:55 PM   Specimen: BLOOD  Result Value Ref Range Status   Specimen Description   Final    BLOOD BLOOD LEFT HAND Performed at [facility], [address] [place], [place] [address]    Special Requests   Final    BOTTLES DRAWN AEROBIC ONLY Blood Culture results may not be optimal due to an inadequate volume of blood received in culture bottles Performed at [facility], [address], [place], [place] [address]    Culture  Setup Time   Final    GRAM POSITIVE COCCI AEROBIC BOTTLE ONLY CRITICAL RESULT CALLED TO, READ BACK BY AND VERIFIED WITH: NATHAM BELUE @ 7846 ON 03/02/2022 BY CAF GRAM STAIN REVIEWED-AGREE WITH RESULT    Culture (A)  Final    STAPHYLOCOCCUS EPIDERMIDIS THE SIGNIFICANCE OF ISOLATING THIS ORGANISM FROM A SINGLE SET OF BLOOD CULTURES WHEN MULTIPLE SETS ARE DRAWN IS UNCERTAIN. PLEASE NOTIFY THE MICROBIOLOGY DEPARTMENT WITHIN ONE WEEK IF SPECIATION AND SENSITIVITIES ARE REQUIRED. Performed at [facility], [place] [address],  [place], [place] [address]    Report Status 03/03/2022 FINAL  Final  Blood Culture ID Panel (Reflexed)     Status: Abnormal   Collection Time: 02/28/22  7:55 PM  Result Value Ref Range Status   Enterococcus faecalis NOT DETECTED NOT DETECTED Final   Enterococcus Faecium NOT DETECTED NOT DETECTED Final   Listeria monocytogenes NOT DETECTED NOT DETECTED Final   Staphylococcus species DETECTED (A) NOT DETECTED Final    Comment: CRITICAL RESULT CALLED TO, READ BACK BY AND VERIFIED WITH: NATHAN BELUE @ 0044 ON 03/02/2022 BY CAF    Staphylococcus aureus (BCID) NOT DETECTED NOT DETECTED Final   Staphylococcus epidermidis DETECTED (A) NOT DETECTED Final    Comment: Methicillin (oxacillin) resistant coagulase negative staphylococcus. Possible blood culture contaminant (unless isolated from more than one blood culture draw or clinical case suggests pathogenicity). No antibiotic treatment is indicated for blood  culture contaminants. CRITICAL RESULT CALLED TO, READ BACK BY AND VERIFIED WITH: NATHAN BELUE @ [id number] ON 03/02/2022 BY CAF    Staphylococcus lugdunensis NOT DETECTED NOT DETECTED Final   Streptococcus species NOT DETECTED NOT DETECTED Final   Streptococcus agalactiae NOT DETECTED NOT DETECTED Final   Streptococcus pneumoniae NOT DETECTED NOT DETECTED Final   Streptococcus pyogenes NOT DETECTED NOT DETECTED Final   A.calcoaceticus-baumannii NOT DETECTED NOT DETECTED Final   Bacteroides fragilis NOT DETECTED NOT DETECTED Final   Enterobacterales NOT DETECTED NOT DETECTED Final   Enterobacter cloacae complex NOT DETECTED NOT DETECTED Final   Escherichia coli NOT DETECTED NOT DETECTED Final  Klebsiella aerogenes NOT DETECTED NOT DETECTED Final   Klebsiella oxytoca NOT DETECTED NOT DETECTED Final   Klebsiella pneumoniae NOT DETECTED NOT DETECTED Final   Proteus species NOT DETECTED NOT DETECTED Final   Salmonella species NOT DETECTED NOT DETECTED Final   Serratia marcescens NOT DETECTED NOT  DETECTED Final   Haemophilus influenzae NOT DETECTED NOT DETECTED Final   Neisseria meningitidis NOT DETECTED NOT DETECTED Final   Pseudomonas aeruginosa NOT DETECTED NOT DETECTED Final   Stenotrophomonas maltophilia NOT DETECTED NOT DETECTED Final   Candida albicans NOT DETECTED NOT DETECTED Final   Candida auris NOT DETECTED NOT DETECTED Final   Candida glabrata NOT DETECTED NOT DETECTED Final   Candida krusei NOT DETECTED NOT DETECTED Final   Candida parapsilosis NOT DETECTED NOT DETECTED Final   Candida tropicalis NOT DETECTED NOT DETECTED Final   Cryptococcus neoformans/gattii NOT DETECTED NOT DETECTED Final   Methicillin resistance mecA/C DETECTED (A) NOT DETECTED Final    Comment: CRITICAL RESULT CALLED TO, READ BACK BY AND VERIFIED WITH: NATHAN BELUE @ [id number] ON 03/02/2022 BY CAF Performed at [facility], [address] [place], [place] [address]   Culture, blood (Routine X 2) w Reflex to ID Panel     Status: None   Collection Time: 02/28/22  8:16 PM   Specimen: BLOOD  Result Value Ref Range Status   Specimen Description BLOOD BLOOD LEFT WRIST  Final   Special Requests   Final    BOTTLES DRAWN AEROBIC AND ANAEROBIC Blood Culture adequate volume   Culture   Final    NO GROWTH 5 DAYS Performed at [facility], [address] [place],  [address]    Report Status 03/05/2022 FINAL  Final    Labs: CBC: Recent Labs  Lab 04/07/22 1549  WBC 7.1  NEUTROABS 5.0  HGB 9.8*  HCT 31.5*  MCV 89.7  PLT 676   Basic Metabolic Panel: Recent Labs  Lab 04/04/22 0400 04/05/22 0453 04/08/22 0521  NA 139  --  136  K 3.8  --  3.7  CL 100  --  100  CO2 30  --  27  GLUCOSE 137*  --  106*  BUN 8  --  9  CREATININE 0.67  --  0.60*  CALCIUM 8.3*  --  8.9  MG  --  1.5*  --    Liver Function Tests: Recent Labs  Lab 04/08/22 0521  AST 25  ALT 41  ALKPHOS 157*  BILITOT 0.2*  PROT 7.0  ALBUMIN 3.0*   CBG: Recent Labs  Lab 04/09/22 1639  04/09/22 2228 04/10/22 0618 04/10/22 1157 04/10/22 1656  GLUCAP 146* 172* 115* 129* 139*    Discharge time spent: less than 30 minutes.  Signed: Annita Brod, MD [facility] 04/10/2022

## 2022-04-10 NOTE — Progress Notes (Signed)
Physical Therapy Session Note  Patient Details  Name: Samuel Chavez MRN: [id number] Date of Birth: 01/13/1976  Today's Date: 04/10/2022 PT Individual Time: 0800-0828 PT Individual Time Calculation (min): 28 min   Short Term Goals: Week 1:  PT Short Term Goal 1 (Week 1): Patient will complete bed mobility with MinA PT Short Term Goal 2 (Week 1): Patient will perform stand pivot transfer with HDRW and MinA PT Short Term Goal 3 (Week 1): Patient will ambulate 19' with HDRW and MinA  Skilled Therapeutic Interventions/Progress Updates:    Pt asleep on arrival and difficult to rouse. Required multimodal stimuli from therapist and nurse, including opening blinds for light and continued verbal stimuli. Pt reporting unrated pain in his low back and RLE. Nsg provided medication. Pt initially resistant to any activity but agreeable to donning shorts. Therapist assisted to thread instructed pt on rolling with step by step cues. Required mod A for rolling for therapist to attempt to pull over hips. Only able to partially pull up when pt reported he was too tired to continue d/t interrupted sleep last night. Pt requested to leave short in place for modesty despite not being properly positioned. Pt was able to pull self up in bed with assist only to hold feet in place on bed. Pt remained in bed with all needs in reach. Pt missed 17 minutes d/t fatigue and refusal.   Therapy Documentation Precautions:  Precautions Precautions: Fall Precaution Comments: PEG, trach with PMSV Restrictions Weight Bearing Restrictions: No Other Position/Activity Restrictions: HOB 30 deg- trach General: PT Amount of Missed Time (min): 17 Minutes PT Missed Treatment Reason: Patient fatigue;Patient unwilling to participate     Therapy/Group: Individual Therapy  Mickel Fuchs 04/10/2022, 8:32 AM

## 2022-04-11 DIAGNOSIS — R1310 Dysphagia, unspecified: Secondary | ICD-10-CM

## 2022-04-11 LAB — GLUCOSE, CAPILLARY
Glucose-Capillary: 109 mg/dL — ABNORMAL HIGH (ref 70–99)
Glucose-Capillary: 148 mg/dL — ABNORMAL HIGH (ref 70–99)
Glucose-Capillary: 142 mg/dL — ABNORMAL HIGH (ref 70–99)
Glucose-Capillary: 137 mg/dL — ABNORMAL HIGH (ref 70–99)

## 2022-04-11 NOTE — Progress Notes (Signed)
Physical Therapy Session Note  Patient Details  Name: Samuel Chavez MRN: [id number] Date of Birth: 11-03-1975  Today's Date: 04/11/2022 PT Individual Time: 0900-1000 PT Individual Time Calculation (min): 60 min   Short Term Goals: Week 1:  PT Short Term Goal 1 (Week 1): Patient will complete bed mobility with MinA PT Short Term Goal 2 (Week 1): Patient will perform stand pivot transfer with HDRW and MinA PT Short Term Goal 3 (Week 1): Patient will ambulate 34' with HDRW and MinA  Skilled Therapeutic Interventions/Progress Updates:  Patient greeted sitting upright in wheelchair in room and agreeable to PT treatment session. Patient propelled manual wheelchair from room to gym ~20' with supv for safety- patient required therapist to push wc the rest of the way to the gym secondary to poor endurance/activity tolerance and decreased strength.   Patient performed x5 sit/stands with HDRW and Min/ModA for initiating standing- Upon standing, patient was able to stand ~10 seconds with CGA and verbal/tactile cues for B shoulder depression and improved postural extension. Patient required an extended seated rest break after sit/stands secondary to poor endurance/activity tolerance. Patient tolerated RA and saturating >90% with exertion and at rest.   Patient gait trained x10', x6' with HDRW and CGA/MinA for improved safety- VC/TC for improved postural extension, stepping within the RW and decreased cadence.   Patient stood with HDRW and attempted to march with B LE, however was only able to perform x4 total secondary to reports of fatigue.   With increased repetition of performing sit/stand with HDRW, patient was able to progress to only requiring CGA/MinA for initiating standing.   MAX encouragement required throughout treatment session for improved engagement and coaxing for trialing challenging activities. Entire treatment session completed on RA with sats >90%.   Patient returned to his room  sitting upright in wheelchair with tray table in front, call bell within reach and all needs met.    Therapy Documentation Precautions:  Precautions Precautions: Fall Precaution Comments: PEG, recently decannulated Restrictions Weight Bearing Restrictions: No Other Position/Activity Restrictions: HOB 30 deg- trach   Therapy/Group: Individual Therapy  Shilo Philipson 04/11/2022, 7:51 AM

## 2022-04-11 NOTE — Plan of Care (Signed)
  Problem: Consults Goal: RH GENERAL PATIENT EDUCATION Description: See Patient Education module for education specifics. Outcome: Progressing   Problem: RH BOWEL ELIMINATION Goal: RH STG MANAGE BOWEL WITH ASSISTANCE Description: STG Manage Bowel with mod I  Assistance. Outcome: Progressing Goal: RH STG MANAGE BOWEL W/MEDICATION W/ASSISTANCE Description: STG Manage Bowel with Medication with mod I  Assistance. Outcome: Progressing   Problem: RH BLADDER ELIMINATION Goal: RH STG MANAGE BLADDER WITH ASSISTANCE Description: STG Manage Bladder With toileting Assistance Outcome: Progressing

## 2022-04-11 NOTE — Progress Notes (Signed)
PROGRESS NOTE   Subjective/Complaints: Pt asked about removing PEG, discussed that it cannot be removed yet. Reports continued chronic back pain, although it is improved with oxycodone.   Review of Systems  Constitutional:  Negative for chills and fever.  Eyes:  Negative for double vision.  Respiratory:  Negative for cough and shortness of breath.   Cardiovascular:  Negative for chest pain and palpitations.  Gastrointestinal:  Negative for abdominal pain.  Genitourinary:  Negative for urgency.  Musculoskeletal:  Positive for back pain.  Neurological:  Positive for weakness. Negative for sensory change and speech change.     Objective:   No results found. No results for input(s): "WBC", "HGB", "HCT", "PLT" in the last 72 hours.  No results for input(s): "NA", "K", "CL", "CO2", "GLUCOSE", "BUN", "CREATININE", "CALCIUM" in the last 72 hours.   Intake/Output Summary (Last 24 hours) at 04/11/2022 0816 Last data filed at 04/11/2022 0528 Gross per 24 hour  Intake 840 ml  Output 1200 ml  Net -360 ml      Pressure Injury 03/08/22 Buttocks Left Stage 3 -  Full thickness tissue loss. Subcutaneous fat may be visible but bone, tendon or muscle are NOT exposed. open, red/pink and yellow; WOC updated Staging to Stage 3 03/10/22 (Active)  03/08/22 1757  Location: Buttocks  Location Orientation: Left  Staging: Stage 3 -  Full thickness tissue loss. Subcutaneous fat may be visible but bone, tendon or muscle are NOT exposed.  Wound Description (Comments): open, red/pink and yellow; WOC updated Staging to Stage 3 03/10/22  Present on Admission: No    Physical Exam: Vital Signs Blood pressure (!) 150/80, pulse 89, temperature 99.6 F (37.6 C), resp. rate 18, height 5\' 9"  (1.753 m), weight (!) 156.5 kg, SpO2 93 %.  General: Morbidly obese AAM, No acute distress.  Working with therapy Mood and affect are appropriate Heart: Regular rate  and rhythm no rubs murmurs or extra sounds Lungs: decreased breath sounds due to body habitus, breathing unlabored, no rales or wheezes, trach has been removed -dressing CDI Abdomen: Positive bowel sounds, soft nontender to palpation, nondistended, PEG in place Extremities: No clubbing, cyanosis, or edema Skin: No evidence of breakdown, no evidence of rash Neurologic: Drowsy today, Cranial nerves II through XII intact, motor strength is 5/5 in bilateral deltoid, bicep, tricep, grip, 3-hip flexor, 3- knee extensors,4/5 ankle dorsiflexor and plantar flexor Sensory exam normal sensation to light touch  in bilateral upper and lower extremities   Musculoskeletal: Full range of motion in all 4 extremities. L spine tenderness  Assessment/Plan: 1. Functional deficits which require 3+ hours per day of interdisciplinary therapy in a comprehensive inpatient rehab setting. Physiatrist is providing close team supervision and 24 hour management of active medical problems listed below. Physiatrist and rehab team continue to assess barriers to discharge/monitor patient progress toward functional and medical goals  Care Tool:  Bathing    Body parts bathed by patient: Right arm, Left arm, Abdomen, Front perineal area, Chest, Right upper leg, Left upper leg, Right lower leg, Face   Body parts bathed by helper: Buttocks     Bathing assist Assist Level: Moderate Assistance - Patient 50 - 74%  Upper Body Dressing/Undressing Upper body dressing   What is the patient wearing?: Hospital gown only    Upper body assist Assist Level: Moderate Assistance - Patient 50 - 74%    Lower Body Dressing/Undressing Lower body dressing      What is the patient wearing?: Pants     Lower body assist Assist for lower body dressing: Moderate Assistance - Patient 50 - 74%     Toileting Toileting    Toileting assist Assist for toileting: 2 Helpers     Transfers Chair/bed transfer  Transfers assist      Chair/bed transfer assist level: 2 Helpers     Locomotion Ambulation   Ambulation assist      Assist level: Maximal Assistance - Patient 25 - 49% Assistive device:  (HDRW) Max distance: 8'   Walk 10 feet activity   Assist  Walk 10 feet activity did not occur: Safety/medical concerns (Unable to assess gait, curb, stair mobility and picking up an item from the floor at this time secondary to impaired endurance/activity tolerance and decreased global strength)        Walk 50 feet activity   Assist Walk 50 feet with 2 turns activity did not occur: Safety/medical concerns         Walk 150 feet activity   Assist Walk 150 feet activity did not occur: Safety/medical concerns         Walk 10 feet on uneven surface  activity   Assist Walk 10 feet on uneven surfaces activity did not occur: Safety/medical concerns         Wheelchair     Assist Is the patient using a wheelchair?: Yes Type of Wheelchair: Manual    Wheelchair assist level: Total Assistance - Patient < 25% Max wheelchair distance: Patient propelled manual wheelchair ~15-20' and therapist completed the remaining distance with total assist    Wheelchair 50 feet with 2 turns activity    Assist        Assist Level: Maximal Assistance - Patient 25 - 49%   Wheelchair 150 feet activity     Assist      Assist Level: Total Assistance - Patient < 25%   Blood pressure (!) 150/80, pulse 89, temperature 99.6 F (37.6 C), resp. rate 18, height 5\' 9"  (1.753 m), weight (!) 156.5 kg, SpO2 93 %.    Medical Problem List and Plan: 1. 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  Antithrombotics: -DVT/anticoagulation:  Pharmaceutical: Eliquis             -antiplatelet therapy: none 3. Pain Management: Tylenol as needed             -Neurontin BID, Lidoderm  daily; oxycodone and Flexeril prn for sciatica and neuropathy  9/20 Gabapentin 600mg  BID increased to 600mg  TID  -Continue oxycodone current dose for now, continue to monitor for effect of increased gabapentin, consider addition of duloxetine 4. Mood/Behavior/Sleep: LCSW to evaluate and provide emotional support             -antipsychotic agents: n/a             -depression: continue Lexapro  -Ativan PRN anxiety 5. Neuropsych/cognition: This patient is capable of making decisions on his own behalf. 6. Skin/Wound Care: Routine skin care  checks             -right ischial DTI             -routine trach and PEG care 7. 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              -continue Duo neb prn  -9/20 pulm consult- Decannulated, appreciate assistance  9: Heart failure with preserved EF:             Continue Coreg 6.25 mg BID             -daily weight  9/21 daily weight overall decreased from 167.8 kg on 9/18, continue to monitor Filed Weights   04/09/22 0519 04/10/22 0500 04/11/22 0500  Weight: (!) 156.5 kg (!) 157.4 kg (!) 156.5 kg   10: Left ear wax: continue Debrox BID 11: Morbid obesity: BMI 54.64; dietary counseling 12: LLE sciatica: continue Neurontin and Flexeril 13: DM: CBGs 4 times daily             -continue Levemir 22 units BID             -SSI  -9/22 well controlled continue current dose of levemir 22u BID 14: Decreased serum magnesium:   -Recheck with next BMET 15: Anemia:   -HGB improved to 9.8 16: RUE brachial DVT secondary to PICC: on Eliquis 17: Vitamin D deficiency: continue supplementation 18: Left knee pain: continue Voltaren gel, Lidoderm 19: Tobacco use: Cessation counseling 20.   Dysphagia improved s/p PEG 8/25, cannot remove earlier than 04/25/22, pt now eating regular diet   -Discussed with patient unable to remove PEG until 04/25/22 21. Low Albumin 3.0 on 9/19, encourage protein intake  22. Constipation  -Pt reports LBM 9/20, continue to monitor      LOS: 4 days A FACE TO FACE EVALUATION WAS PERFORMED  Jennye Boroughs 04/11/2022, 8:16 AM

## 2022-04-11 NOTE — Progress Notes (Signed)
Physical Therapy Session Note  Patient Details  Name: Samuel Chavez MRN: [id number] Date of Birth: June 13, 1976  Today's Date: 04/11/2022 PT Individual Time: 1645-1725   40 min   Short Term Goals: Week 1:  PT Short Term Goal 1 (Week 1): Patient will complete bed mobility with MinA PT Short Term Goal 2 (Week 1): Patient will perform stand pivot transfer with HDRW and MinA PT Short Term Goal 3 (Week 1): Patient will ambulate 91' with HDRW and MinA   Skilled Therapeutic Interventions/Progress Updates:   Pt received sitting in WC and agreeable to PT. Pt transported to rehab gym in [facility]. Kinetron 30 sec bouts with prolonged therapeutic rest breaks between bouts. Performed 10 with cues from PT for improved participation and pursed lip breathing durring and following each bout. PT educated pt in deep core strengthening exercises to improve postural control and reduced back pain. Patient returned to room and left sitting in [facility] with call bell in reach and all needs met.         Therapy Documentation Precautions:  Precautions Precautions: Fall Precaution Comments: PEG, recently decannulated Restrictions Weight Bearing Restrictions: No Other Position/Activity Restrictions: HOB 30 deg- trach  Vital Signs: Therapy Vitals Temp: 98.8 F (37.1 C) Pulse Rate: 94 Resp: 16 BP: 138/79 Patient Position (if appropriate): Sitting Oxygen Therapy SpO2: 98 % O2 Device: Room Air Pain: Pain Assessment Pain Scale: 0-10 Pain Score: 6  Pain Location: Back Pain Orientation: Lower Pain Descriptors / Indicators: Shooting Pain Onset: On-going Patients Stated Pain Goal: 0 Pain Intervention(s): Medication (See eMAR);Ambulation/increased activity   Therapy/Group: Individual Therapy  Lorie Phenix 04/11/2022, 4:55 PM

## 2022-04-11 NOTE — Progress Notes (Signed)
Occupational Therapy Session Note  Patient Details  Name: Samuel Chavez MRN: [id number] Date of Birth: October 24, 1975  Today's Date: 04/11/2022 OT Individual Time: 1300-1400 OT Individual Time Calculation (min): 60 min    Short Term Goals: Week 1:  OT Short Term Goal 1 (Week 1): Pt will don LB clothing with min A OT Short Term Goal 2 (Week 1): Pt will complete sit > stand with min A OT Short Term Goal 3 (Week 1): Pt will complete a stand pivot transfer with max A of 1 OT Short Term Goal 4 (Week 1): Pt will tolerate one minute of standing with no desaturation to demo improved activity tolerance  Skilled Therapeutic Interventions/Progress Updates:    Pt received sitting in the w/c with 7/10 pain in his lower back, no request for intervention. He was agreeable to OT session. He reported fatigue and requested to focus on UB strengthening. He was taken outside and he reported really benefiting from being outdoors and the mood boost it provided. He was able to really open up outdoors and in length discussed his recovery, experiences being intubated, his roles being a dad and grandfather and how to re-engage in these meaningful roles in life. OT guided discussion and provided recommendations/ideas of reintegration into IADLs. He also completed UB strengthening exercises. Pt completes 2x10-15 dowel rod therex for BUE shoulder strengthening required for BADLs/functional transfers as follows with demo cuing and 4 lb dumbbells: Shoulder flex/ext,  Elbow flex/ext, and chest press. Pt returned to his room and was left sitting up with all needs met.    Therapy Documentation Precautions:  Precautions Precautions: Fall Precaution Comments: PEG, recently decannulated Restrictions Weight Bearing Restrictions: No Other Position/Activity Restrictions: HOB 30 deg- trach   Therapy/Group: Individual Therapy  Curtis Sites 04/11/2022, 7:50 AM

## 2022-04-11 NOTE — Progress Notes (Signed)
Speech Language Pathology Daily Session Note  Patient Details  Name: ALONZO OWCZARZAK MRN: [id number] Date of Birth: [birth year]  Today's Date: 04/11/2022 SLP Individual Time: 1455-1540 SLP Individual Time Calculation (min): 45 min  Short Term Goals: Week 1: SLP Short Term Goal 1 (Week 1): Patient will demonstrate complex problem solving for functional tasks with Min A verbal and visual cues. SLP Short Term Goal 2 (Week 1): Patient will recall new, daily information with use of external aids with Mod verbal and visual cues.  Skilled Therapeutic Interventions: Skilled ST treatment focused on cognitive goals. Pt reporting depression and difficult time adjusting to new changes. Pt feels like he is a burden to others and disappointed with current limitations. SLP provided validation and discussed positive re-frame. Pt was able to discuss several aspects of his recovery that felt positive including sitting in a chair today rather than laying in bed, walking 11 ft in PT, support from his son, and seeing his granddaughter yesterday. SLP recommending pt be seen by neuropsych. Requested SW to make referral. SLP facilitated working memory and alternating attention task in which pt learned novel information and simultaneously answered questions regarding learned details in effort to implement internal memory strategies including repetition, association, and visualization. Pt recalled up to 15 novel details with implementation of strategies with 100% accuracy at the mod I level. Pt expressed feeling much better and near cognitive baseline. Plan to continue high level problem solving and memory tasks. Patient was left in wheelchair with alarm activated and immediate needs within reach at end of session. Continue per current plan of care.      Pain  None/denied  Therapy/Group: Individual Therapy  Patty Sermons 04/11/2022, 4:25 PM

## 2022-04-12 ENCOUNTER — Inpatient Hospital Stay (HOSPITAL_COMMUNITY): Payer: Medicaid Other

## 2022-04-12 DIAGNOSIS — M25561 Pain in right knee: Secondary | ICD-10-CM

## 2022-04-12 LAB — GLUCOSE, CAPILLARY
Glucose-Capillary: 122 mg/dL — ABNORMAL HIGH (ref 70–99)
Glucose-Capillary: 107 mg/dL — ABNORMAL HIGH (ref 70–99)
Glucose-Capillary: 117 mg/dL — ABNORMAL HIGH (ref 70–99)
Glucose-Capillary: 133 mg/dL — ABNORMAL HIGH (ref 70–99)

## 2022-04-12 NOTE — Progress Notes (Signed)
04/12/22 1205  What Happened  Was fall witnessed? Yes (assisted to floor)  Who witnessed fall? Oren Barella RN- Patient lowered to floor  Patients activity before fall bathroom-unassisted  Point of contact other (comment) (right knee)  Was patient injured? Unsure  Follow Up  MD notified Naaman Plummer  Time MD notified 68  Family notified Yes - comment (notified by patient)  Time family notified 1215  Additional tests Yes-comment (x-ray of right knee)  Simple treatment Ice  Progress note created (see row info) Yes  Adult Fall Risk Assessment  Risk Factor Category (scoring not indicated) Fall has occurred during this admission (document High fall risk)  Age 46  Fall History: Fall within 6 months prior to admission 0  Elimination; Bowel and/or Urine Incontinence 0  Elimination; Bowel and/or Urine Urgency/Frequency 2  Medications: includes PCA/Opiates, Anti-convulsants, Anti-hypertensives, Diuretics, Hypnotics, Laxatives, Sedatives, and Psychotropics 5  Patient Care Equipment 0  Mobility-Assistance 2  Mobility-Gait 2  Mobility-Sensory Deficit 0  Altered awareness of immediate physical environment 0  Impulsiveness 0  Lack of understanding of one's physical/cognitive limitations 0  Total Score 11  Patient Fall Risk Level High fall risk  Adult Fall Risk Interventions  Required Bundle Interventions *See Row Information* High fall risk - low, moderate, and high requirements implemented  Additional Interventions PT/OT need assessed if change in mobility from baseline;Use of appropriate toileting equipment (bedpan, BSC, etc.);Other (Comment) (redirection)  Screening for Fall Injury Risk (To be completed on HIGH fall risk patients) - Assessing Need for Floor Mats  Risk For Fall Injury- Criteria for Floor Mats Noncompliant with safety precautions  Will Implement Floor Mats Yes  Vitals  Temp 98.6 F (37 C)  Temp Source Oral  BP (!) 140/95  MAP (mmHg) 108  BP Location Right Arm  BP Method  Automatic  Patient Position (if appropriate) Sitting  Pulse Rate 90  Pulse Rate Source Monitor  Resp 16  Oxygen Therapy  SpO2 100 %  O2 Device Room Air  Pain Assessment  Pain Scale 0-10  Pain Score 10  Pain Type Chronic pain  Pain Location Back  Pain Orientation Lower  Pain Descriptors / Indicators Shooting  Pain Frequency Constant  Pain Onset On-going  Patients Stated Pain Goal 0  Pain Intervention(s) Medication (See eMAR)  Multiple Pain Sites No  Neurological  Neuro (WDL) X  Level of Consciousness Alert  Orientation Level Oriented X4  Cognition Poor safety awareness  Speech Clear  R Hand Grip Moderate  L Hand Grip Moderate  RUE Motor Response Purposeful movement  RUE Sensation Full sensation  RUE Motor Strength 4  LUE Motor Response Purposeful movement  LUE Sensation Full sensation  LUE Motor Strength 3  RLE Motor Response Purposeful movement  RLE Sensation Numbness  RLE Motor Strength 3  LLE Motor Response Purposeful movement  LLE Sensation Numbness  LLE Motor Strength 3  Neuro Symptoms Fatigue  Neuro symptoms relieved by Rest  Musculoskeletal  Musculoskeletal (WDL) X  Assistive Device Wheelchair;Front wheel walker  Generalized Weakness Yes  Weight Bearing Restrictions No  Musculoskeletal Details  RUE Weakness  LUE Weakness  RLE Weakness  LLE Weakness  Integumentary  Integumentary (WDL) X  Skin Color Appropriate for ethnicity  Skin Condition Dry;Flaky;Itching  Itching intervention benadryl PRN  Skin Integrity Abrasion;Scratch Marks  Abrasion Location Scrotum  Abrasion Location Orientation Bilateral  Abrasion Intervention Cleansed  Scratch Marks Location Other (Comment) (various areas)  Scratch Marks Location Orientation Bilateral  Scratch Marks Intervention Cleansed  Skin Turgor Non-tenting  Pain Assessment  Date Pain First Started 04/07/22  Result of Injury No

## 2022-04-12 NOTE — Progress Notes (Signed)
PROGRESS NOTE   Subjective/Complaints: Pt asking about PEG again. Does have some discomfort with it. Later this morning after I saw him he got up on his own to go to bathroom and had an assisted fall per RN. Was found on right knee which is now sore.   Review of Systems  Constitutional:  Negative for chills and fever.  Eyes:  Negative for double vision.  Respiratory:  Negative for cough and shortness of breath.   Cardiovascular:  Negative for chest pain and palpitations.  Gastrointestinal:  Negative for abdominal pain.  Genitourinary:  Negative for urgency.  Musculoskeletal:  Positive for back pain.  Neurological:  Positive for weakness. Negative for sensory change and speech change.     Objective:   No results found. No results for input(s): "WBC", "HGB", "HCT", "PLT" in the last 72 hours.  No results for input(s): "NA", "K", "CL", "CO2", "GLUCOSE", "BUN", "CREATININE", "CALCIUM" in the last 72 hours.   Intake/Output Summary (Last 24 hours) at 04/12/2022 1233 Last data filed at 04/12/2022 8768 Gross per 24 hour  Intake 240 ml  Output 2225 ml  Net -1985 ml      Pressure Injury 03/08/22 Buttocks Left Stage 3 -  Full thickness tissue loss. Subcutaneous fat may be visible but bone, tendon or muscle are NOT exposed. open, red/pink and yellow; WOC updated Staging to Stage 3 03/10/22 (Active)  03/08/22 1757  Location: Buttocks  Location Orientation: Left  Staging: Stage 3 -  Full thickness tissue loss. Subcutaneous fat may be visible but bone, tendon or muscle are NOT exposed.  Wound Description (Comments): open, red/pink and yellow; WOC updated Staging to Stage 3 03/10/22  Present on Admission: No    Physical Exam: Vital Signs Blood pressure (!) 140/95, pulse 90, temperature 98.6 F (37 C), temperature source Oral, resp. rate 16, height 5\' 9"  (1.753 m), weight (!) 159.1 kg, SpO2 100 %.  Constitutional: No distress .  Vital signs reviewed. HEENT: NCAT, EOMI, oral membranes moist Neck: supple Cardiovascular: RRR without murmur. No JVD    Respiratory/Chest: CTA Bilaterally without wheezes or rales. Normal effort    GI/Abdomen: BS +, non-tender, non-distended. PEG site clean, bumper is loose Ext: no clubbing, cyanosis, or edema Psych: pleasant and cooperative  Skin: No evidence of breakdown, no evidence of rash Neurologic: Drowsy today, Cranial nerves II through XII intact, motor strength is 5/5 in bilateral deltoid, bicep, tricep, grip, 3-hip flexor, 3- knee extensors,4/5 ankle dorsiflexor and plantar flexor Sensory exam normal sensation to light touch  in bilateral upper and lower extremities   Musculoskeletal: Right knee tenderness at/around patella  Assessment/Plan: 1. Functional deficits which require 3+ hours per day of interdisciplinary therapy in a comprehensive inpatient rehab setting. Physiatrist is providing close team supervision and 24 hour management of active medical problems listed below. Physiatrist and rehab team continue to assess barriers to discharge/monitor patient progress toward functional and medical goals  Care Tool:  Bathing    Body parts bathed by patient: Right arm, Left arm, Abdomen, Front perineal area, Chest, Right upper leg, Left upper leg, Right lower leg, Face   Body parts bathed by helper: Buttocks     Bathing  assist Assist Level: Moderate Assistance - Patient 50 - 74%     Upper Body Dressing/Undressing Upper body dressing   What is the patient wearing?: Hospital gown only    Upper body assist Assist Level: Moderate Assistance - Patient 50 - 74%    Lower Body Dressing/Undressing Lower body dressing      What is the patient wearing?: Pants     Lower body assist Assist for lower body dressing: Moderate Assistance - Patient 50 - 74%     Toileting Toileting Toileting Activity did not occur Landscape architect and hygiene only): N/A (no void or bm)   Toileting assist Assist for toileting: 2 Helpers     Transfers Chair/bed transfer  Transfers assist     Chair/bed transfer assist level: 2 Helpers     Locomotion Ambulation   Ambulation assist      Assist level: Maximal Assistance - Patient 25 - 49% Assistive device:  (HDRW) Max distance: 8'   Walk 10 feet activity   Assist  Walk 10 feet activity did not occur: Safety/medical concerns (Unable to assess gait, curb, stair mobility and picking up an item from the floor at this time secondary to impaired endurance/activity tolerance and decreased global strength)        Walk 50 feet activity   Assist Walk 50 feet with 2 turns activity did not occur: Safety/medical concerns         Walk 150 feet activity   Assist Walk 150 feet activity did not occur: Safety/medical concerns         Walk 10 feet on uneven surface  activity   Assist Walk 10 feet on uneven surfaces activity did not occur: Safety/medical concerns         Wheelchair     Assist Is the patient using a wheelchair?: Yes Type of Wheelchair: Manual    Wheelchair assist level: Total Assistance - Patient < 25% Max wheelchair distance: Patient propelled manual wheelchair ~15-20' and therapist completed the remaining distance with total assist    Wheelchair 50 feet with 2 turns activity    Assist        Assist Level: Maximal Assistance - Patient 25 - 49%   Wheelchair 150 feet activity     Assist      Assist Level: Total Assistance - Patient < 25%   Blood pressure (!) 140/95, pulse 90, temperature 98.6 F (37 C), temperature source Oral, resp. rate 16, height 5\' 9"  (1.753 m), weight (!) 159.1 kg, SpO2 100 %.    Medical Problem List and Plan: 1. 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   Antithrombotics: -DVT/anticoagulation:  Pharmaceutical: Eliquis             -antiplatelet therapy: none 3. Pain Management: Tylenol as needed             -Neurontin BID, Lidoderm daily; oxycodone and Flexeril prn for sciatica and neuropathy  9/20 Gabapentin 600mg  BID increased to 600mg  TID  -Continue oxycodone current dose for now, continue to monitor for effect of increased gabapentin, consider addition of duloxetine  9/23 new right knee pain after assisted fall   -check xrays   -apply ice 4. Mood/Behavior/Sleep: LCSW to evaluate and provide emotional support             -  antipsychotic agents: n/a             -depression: continue Lexapro  -Ativan PRN anxiety 5. Neuropsych/cognition: This patient is capable of making decisions on his own behalf. 6. Skin/Wound Care: Routine skin care checks             -right ischial DTI             -routine trach and PEG care 7. 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              -continue Duo neb prn  -9/20 pulm consult- Decannulated, appreciate assistance  9: Heart failure with preserved EF:             Continue Coreg 6.25 mg BID             -daily weight  9/21 daily weight overall decreased from 167.8 kg on 9/18, continue to monitor Filed Weights   04/10/22 0500 04/11/22 0500 04/12/22 0446  Weight: (!) 157.4 kg (!) 156.5 kg (!) 159.1 kg   10: Left ear wax: continue Debrox BID 11: Morbid obesity: BMI 54.64; dietary counseling 12: LLE sciatica: continue Neurontin and Flexeril 13: DM: CBGs 4 times daily             -continue Levemir 22 units BID             -SSI  CBG (last 3)  Recent Labs    04/11/22 2052 04/12/22 0652 04/12/22 1215  GLUCAP 137* 122* 107*      -9/23 well controlled continue current dose of levemir 22u BID 14: Decreased serum magnesium:   -Recheck with next BMET 15: Anemia:   -HGB improved to 9.8 16: RUE brachial DVT secondary to PICC: on Eliquis 17: Vitamin D deficiency: continue supplementation 18: Left knee pain: continue Voltaren gel, Lidoderm 19: Tobacco use: Cessation counseling 20.  Dysphagia improved s/p PEG 8/25, cannot remove earlier than 04/25/22, pt now eating regular diet   -this has been discussed with pt 21. Low Albumin 3.0 on 9/19, encourage protein intake  22. Constipation  -Pt reports LBM 9/22, continue to monitor      LOS: 5 days A FACE TO FACE EVALUATION WAS PERFORMED  Meredith Staggers 04/12/2022, 12:33 PM

## 2022-04-13 LAB — GLUCOSE, CAPILLARY
Glucose-Capillary: 130 mg/dL — ABNORMAL HIGH (ref 70–99)
Glucose-Capillary: 117 mg/dL — ABNORMAL HIGH (ref 70–99)
Glucose-Capillary: 116 mg/dL — ABNORMAL HIGH (ref 70–99)
Glucose-Capillary: 174 mg/dL — ABNORMAL HIGH (ref 70–99)

## 2022-04-13 NOTE — Plan of Care (Signed)
  Problem: Consults Goal: RH GENERAL PATIENT EDUCATION Description: See Patient Education module for education specifics. Outcome: Progressing   Problem: RH BOWEL ELIMINATION Goal: RH STG MANAGE BOWEL WITH ASSISTANCE Description: STG Manage Bowel with mod I  Assistance. Outcome: Not Progressing Goal: RH STG MANAGE BOWEL W/MEDICATION W/ASSISTANCE Description: STG Manage Bowel with Medication with mod I  Assistance. Outcome: Not Progressing   Problem: RH BLADDER ELIMINATION Goal: RH STG MANAGE BLADDER WITH ASSISTANCE Description: STG Manage Bladder With toileting Assistance Outcome: Not Progressing   Problem: RH SAFETY Goal: RH STG ADHERE TO SAFETY PRECAUTIONS W/ASSISTANCE/DEVICE Description: STG Adhere to Safety Precautions With Assistance/Device. Outcome: Not Progressing   Problem: RH KNOWLEDGE DEFICIT GENERAL Goal: RH STG INCREASE KNOWLEDGE OF SELF CARE AFTER HOSPITALIZATION Description: Patient, and daughter will be able to manage care, medications and dietary modifications, trach using handouts and educational resources independently Outcome: Not Progressing   Problem: Education: Goal: Knowledge about tracheostomy care/management will improve Description: Independent with care using handouts and educational resources Outcome: Progressing   Problem: Activity: Goal: Ability to tolerate increased activity will improve Description: Supervision level Outcome: Progressing   Problem: Respiratory: Goal: Patent airway maintenance will improve Description: Trach care managed w supervision Outcome: Progressing

## 2022-04-13 NOTE — Progress Notes (Addendum)
Speech Language Pathology Daily Session Note  Patient Details  Name: Samuel Chavez MRN: [id number] Date of Birth: [birth year]  Today's Date: 04/13/2022 SLP Individual Time: 1115-1200 SLP Individual Time Calculation (min): 45 min  Short Term Goals: Week 1: SLP Short Term Goal 1 (Week 1): Patient will demonstrate complex problem solving for functional tasks with Min A verbal and visual cues. - Reviewed importance and rationale for use of call bell and waiting for assistance prior to standing with pt and pt's daughter; pt with documented fall this weekend due to attempting to utilizing bathroom without help.   SLP Short Term Goal 2 (Week 1): Patient will recall new, daily information with use of external aids with Mod verbal and visual cues. - Pt recalled a list of 7 novel words given Sup A verbal cue x 1 immediately following exposure; 100% accuracy following 5-minute delay and 15-minute delay at the Mod I level.  Skilled Therapeutic Interventions: S: Pt seen this date for skilled ST intervention targeting cognitive goals outlined above. Pt received awake/alert and OOB in w/c. Daughter present. Agreeable to ST intervention in hospital room.  O: Please see above for objective data re: pt's performance on targeted goals. SLP provided skilled education re: compensatory memory strategies, which pt was unable to fully recall despite previous education on strategies. Utilized strategies given Sup A verbal cues during therapeutic cognitive tasks targeting recall of grocery list. Mod I for use of external aids to recall therapists name. Initiated conversation re: medication management and reviewed current medications. Pt and pt's daughter report pt previously utilized pill organizer, and would be amenable to trying to fill one in upcoming sessions. Unable to begin this session due to time constraints. Reviewed "call, don't fall" signage to promote waiting for available staff to assist with transferring to  the restroom.  A: Pt remains sitmulable for skilled ST intervention as evident by pt recalling information without assistance as task progressed.  P: Pt left OOB in w/c with all safety measures activated. Call bell reviewed and within reach and all immediate needs met. Daughter remain with pt at the end of today's session. Continue per current ST POC next session. Specifically, pt and pt's daughter would like to address medication management via pill organizer in upcoming sessions.  Pain Pain Assessment Pain Scale: 0-10 Pain Score: 0-No pain  Therapy/Group: Individual Therapy  Nisreen Guise A Chalene Treu 04/13/2022, 3:42 PM

## 2022-04-13 NOTE — Progress Notes (Signed)
Occupational Therapy Session Note  Patient Details  Name: Samuel Chavez MRN: [id number] Date of Birth: 1976/03/06  Today's Date: 04/13/2022 OT Individual Time: 0930-1015 OT Individual Time Calculation (min): 45 min    Short Term Goals: Week 1:  OT Short Term Goal 1 (Week 1): Pt will don LB clothing with min A OT Short Term Goal 2 (Week 1): Pt will complete sit > stand with min A OT Short Term Goal 3 (Week 1): Pt will complete a stand pivot transfer with max A of 1 OT Short Term Goal 4 (Week 1): Pt will tolerate one minute of standing with no desaturation to demo improved activity tolerance  Skilled Therapeutic Interventions/Progress Updates:   Patient presents supine in bed - sleeping - difficult to arouse.  Patient reports "I thought I had a day off - didn't know I had all this again today."  Patient reporting significant back pain.  Called for pain medication.  Patient agreeable to sit at edge of bed.  He reports he has already washed up today and did not want to get out of hospital gown - "too hot" and daughter / patient indicate that he has difficulty voiding/ using urinal with clothing on.  Patient needing mod assist and increased time to sit at edge of bed.  Once seated - patient requesting to lean forward to relieve pressure in back - pain reduced from "11/10 to 8/10"  with pressure relief.   Patient very upset that children at home are getting into trouble and not listening to him.   Patient visibly upset.  Listened to patient's concerns and reinforced the importance of putting his energy into getting stronger to get out of the hospital.  Patient assisted to wheelchair with RW and Mod/Min assist.  Patient completed grooming at sink.  O2 remianed at 100% on room air this session.  Patient left up in wheelchair with call bell and personal items in reach.    Therapy Documentation Precautions:  Precautions Precautions: Fall Precaution Comments: PEG, recently  decannulated Restrictions Weight Bearing Restrictions: No Other Position/Activity Restrictions: HOB 30 deg- trach   Vital Signs: Oxygen Therapy SpO2: 100 % O2 Device: Room Air Pain: Pain Assessment Pain Scale: 0-10 Pain Score: "11" Worst pain ever Pain Type: Chronic pain Pain Location: Back Pain Orientation: Lower Pain Descriptors / Indicators: Shooting Pain Frequency: Constant Pain Onset: On-going Patients Stated Pain Goal: 1 Pain Intervention(s): Medication (See eMAR) Multiple Pain Sites: No   Therapy/Group: Individual Therapy  Mariah Milling 04/13/2022, 12:11 PM

## 2022-04-13 NOTE — Progress Notes (Signed)
Physical Therapy Session Note  Patient Details  Name: Samuel Chavez MRN: [id number] Date of Birth: 1976-04-04  Today's Date: 04/13/2022 PT Individual Time: 0800-0900 PT Individual Time Calculation (min): 60 min & 43 min     Short Term Goals: Week 1:  PT Short Term Goal 1 (Week 1): Patient will complete bed mobility with MinA PT Short Term Goal 2 (Week 1): Patient will perform stand pivot transfer with HDRW and MinA PT Short Term Goal 3 (Week 1): Patient will ambulate 33' with HDRW and MinA  Skilled Therapeutic Interventions/Progress Updates:      Therapy Documentation Precautions:  Precautions Precautions: Fall Precaution Comments: PEG, recently decannulated Restrictions Weight Bearing Restrictions: No Other Position/Activity Restrictions: HOB 30 deg- trach  Treatment Session 1:  Pt received semi-reclined in bed, agreeable to PT session. Pt reports pain level 4-5/10, pre-medicated. Pt reports mild right knee soreness and edema from fall previous day. Pt reports HDRW removed from hospital room yesterday and therapist utilized time during treatment session to retrieve bariatric RW. On PT return to room pt declined going to gym and requested bed level exercises. Pt (S) wit supine to sit with HOB elevated.  Pt performed following exercises to address strength and activity tolerance deficits:   -Seated LAQ bilateral 3 x 8   -Seated hip flexion 3 x 8   -Seated resisted hip abduction 3 x 8 (green theraband) Pt requires CGA with sit to stand with bariatric RW from elevated bed and performed lateral side steps toward head of bed. Pt required min A for sit to supine and able to scoot up in bed (S) with use of bed rails. Pt instructed and educated to perform ankle pumps to address increased right knee edema and pt left semi-reclined in bed with all needs in reach and alarm on.   Treatment Session 2:  Pt received seated in w/c at bedside, agreeable to PT session. Pt reports 7-8/10 back pain  and nursing notified and administered medication at start of session. Pt propelled w/c ~40 ft close (S) and transported remaining distance to main gym due to decreased activity tolerance. Pt utilized kinetron to increase LE strength and acivity tolerance. Pt performed 3 x 30 s at 20 cm/sec for first two trials and 30 cm/sec for final trial as pt reported increased fatigue. Pt provided with extensive rest break for relief and ambulated 5 ft with HDRW min with close w/c follow. Pt emotional following gait and PT provided emotional support. Pt transported to room and PT educated pt on neuropsychologist and pt reported he wanted to think about the recommendation. Pt left seated in w/c at bedside with all needs in reach and declined alarm.    Therapy/Group: Individual Therapy  Verl Dicker Verl Dicker PT, DPT  04/13/2022, 7:37 AM

## 2022-04-13 NOTE — Progress Notes (Signed)
PROGRESS NOTE   Subjective/Complaints: Pt feeling ok after fall yesterday. No knee pain. Reviewed knee xray results with him. Was surprised he had "arthritis". Expressed anxiety over not being able to move around like he wants to and says that's partially why he got up on his own. Asked for something for "nerves."---told him he has something, has to ask for it   Review of Systems  Constitutional:  Negative for chills and fever.  Eyes:  Negative for double vision.  Respiratory:  Negative for cough and shortness of breath.   Cardiovascular:  Negative for chest pain and palpitations.  Gastrointestinal:  Negative for abdominal pain.  Genitourinary:  Negative for urgency.  Musculoskeletal:  Positive for back pain. Negative for joint pain.  Neurological:  Positive for weakness. Negative for sensory change and speech change.  Psychiatric/Behavioral:  The patient is nervous/anxious.      Objective:   DG Knee 1-2 Views Right  Result Date: 04/12/2022 CLINICAL DATA:  Right knee pain, inpatient EXAM: RIGHT KNEE - 1-2 VIEW COMPARISON:  None Available. FINDINGS: No fracture, joint effusion or dislocation. No suspicious focal osseous lesions. Minimal patellofemoral and lateral compartment right knee osteoarthritis. Generalized soft tissue swelling. No suspicious focal osseous lesions. Tiny superior right patellar enthesophyte. No radiopaque foreign bodies. IMPRESSION: Generalized soft tissue swelling. Minimal patellofemoral and lateral compartment right knee osteoarthritis. No acute osseous abnormality. Electronically Signed   By: Ilona Sorrel M.D.   On: 04/12/2022 15:00   No results for input(s): "WBC", "HGB", "HCT", "PLT" in the last 72 hours.  No results for input(s): "NA", "K", "CL", "CO2", "GLUCOSE", "BUN", "CREATININE", "CALCIUM" in the last 72 hours.   Intake/Output Summary (Last 24 hours) at 04/13/2022 0904 Last data filed at 04/13/2022  0748 Gross per 24 hour  Intake 1335 ml  Output 1400 ml  Net -65 ml      Pressure Injury 03/08/22 Buttocks Left Stage 3 -  Full thickness tissue loss. Subcutaneous fat may be visible but bone, tendon or muscle are NOT exposed. open, red/pink and yellow; WOC updated Staging to Stage 3 03/10/22 (Active)  03/08/22 1757  Location: Buttocks  Location Orientation: Left  Staging: Stage 3 -  Full thickness tissue loss. Subcutaneous fat may be visible but bone, tendon or muscle are NOT exposed.  Wound Description (Comments): open, red/pink and yellow; WOC updated Staging to Stage 3 03/10/22  Present on Admission: No    Physical Exam: Vital Signs Blood pressure (!) 151/78, pulse 91, temperature 99 F (37.2 C), temperature source Oral, resp. rate 18, height 5\' 9"  (1.753 m), weight (!) 159.1 kg, SpO2 97 %.  Constitutional: No distress . Vital signs reviewed. obese HEENT: NCAT, EOMI, oral membranes moist Neck: supple Cardiovascular: RRR without murmur. No JVD    Respiratory/Chest: CTA Bilaterally without wheezes or rales. Normal effort    GI/Abdomen: BS +, non-tender, non-distended Ext: no clubbing, cyanosis, or edema Psych: pleasant and cooperative   Skin: No evidence of breakdown, no evidence of rash. Trach stoma dressed. PEG site clean, intact. Right buttock region not visualized Neurologic: Drowsy today, Cranial nerves II through XII intact, motor strength is 5/5 in bilateral deltoid, bicep, tricep, grip, 3-hip flexor,  3- knee extensors,4/5 ankle dorsiflexor and plantar flexor Sensory exam normal sensation to light touch  in bilateral upper and lower extremities   Musculoskeletal: Right knee tenderness at/around patella  Assessment/Plan: 1. Functional deficits which require 3+ hours per day of interdisciplinary therapy in a comprehensive inpatient rehab setting. Physiatrist is providing close team supervision and 24 hour management of active medical problems listed below. Physiatrist and  rehab team continue to assess barriers to discharge/monitor patient progress toward functional and medical goals  Care Tool:  Bathing    Body parts bathed by patient: Right arm, Left arm, Abdomen, Front perineal area, Chest, Right upper leg, Left upper leg, Right lower leg, Face   Body parts bathed by helper: Buttocks     Bathing assist Assist Level: Moderate Assistance - Patient 50 - 74%     Upper Body Dressing/Undressing Upper body dressing   What is the patient wearing?: Hospital gown only    Upper body assist Assist Level: Moderate Assistance - Patient 50 - 74%    Lower Body Dressing/Undressing Lower body dressing      What is the patient wearing?: Pants     Lower body assist Assist for lower body dressing: Moderate Assistance - Patient 50 - 74%     Toileting Toileting Toileting Activity did not occur Landscape architect and hygiene only): N/A (no void or bm)  Toileting assist Assist for toileting: 2 Helpers     Transfers Chair/bed transfer  Transfers assist     Chair/bed transfer assist level: 2 Helpers     Locomotion Ambulation   Ambulation assist      Assist level: Maximal Assistance - Patient 25 - 49% Assistive device:  (HDRW) Max distance: 8'   Walk 10 feet activity   Assist  Walk 10 feet activity did not occur: Safety/medical concerns (Unable to assess gait, curb, stair mobility and picking up an item from the floor at this time secondary to impaired endurance/activity tolerance and decreased global strength)        Walk 50 feet activity   Assist Walk 50 feet with 2 turns activity did not occur: Safety/medical concerns         Walk 150 feet activity   Assist Walk 150 feet activity did not occur: Safety/medical concerns         Walk 10 feet on uneven surface  activity   Assist Walk 10 feet on uneven surfaces activity did not occur: Safety/medical concerns         Wheelchair     Assist Is the patient using a  wheelchair?: Yes Type of Wheelchair: Manual    Wheelchair assist level: Total Assistance - Patient < 25% Max wheelchair distance: Patient propelled manual wheelchair ~15-20' and therapist completed the remaining distance with total assist    Wheelchair 50 feet with 2 turns activity    Assist        Assist Level: Maximal Assistance - Patient 25 - 49%   Wheelchair 150 feet activity     Assist      Assist Level: Total Assistance - Patient < 25%   Blood pressure (!) 151/78, pulse 91, temperature 99 F (37.2 C), temperature source Oral, resp. rate 18, height 5\' 9"  (1.753 m), weight (!) 159.1 kg, SpO2 97 %.    Medical Problem List and Plan: 1. 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 Had discussion about rehab expectations and safety with patient. He expressed an understanding and will ask for help next time he wants to get out of bed.    2.  Antithrombotics: -DVT/anticoagulation:  Pharmaceutical: Eliquis             -antiplatelet therapy: none 3. Pain Management: Tylenol as needed             -Neurontin BID, Lidoderm daily; oxycodone and Flexeril prn for sciatica and neuropathy  9/20 Gabapentin 600mg  BID increased to 600mg  TID  -Continue oxycodone current dose for now, continue to monitor for effect of increased gabapentin, consider addition of duloxetine  9/24 knee pain resolved after yesterday's fall  -reviewed films with pt--mild OA 4. Mood/Behavior/Sleep: LCSW to evaluate and provide emotional support             -antipsychotic agents: n/a             -depression: continue Lexapro  -Ativan PRN anxiety--told pt he has it available to use. It's not scheduled 5. Neuropsych/cognition: This patient is capable of making decisions on his own behalf. 6. Skin/Wound Care: Routine skin care checks              -right ischial DTI             -routine trach and PEG care 7. 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              -continue Duo neb prn  -9/20 pulm consult- Decannulated, appreciate assistance  9: Heart failure with preserved EF:             Continue Coreg 6.25 mg BID             -daily weight  9/21 daily weight overall decreased from 167.8 kg on 9/18, continue to monitor Filed Weights   04/10/22 0500 04/11/22 0500 04/12/22 0446  Weight: (!) 157.4 kg (!) 156.5 kg (!) 159.1 kg   10: Left ear wax: continue Debrox BID 11: Morbid obesity: BMI 54.64; dietary counseling 12: LLE sciatica: continue Neurontin and Flexeril 13: DM: CBGs 4 times daily             -continue Levemir 22 units BID             -SSI  CBG (last 3)  Recent Labs    04/12/22 1708 04/12/22 2034 04/13/22 0635  GLUCAP 117* 133* 130*      -9/24 well controlled continue current dose of levemir 22u BID 14: Decreased serum magnesium:   -Recheck with next BMET 15: Anemia:   -HGB improved to 9.8 16: RUE brachial DVT secondary to PICC: on Eliquis 17: Vitamin D deficiency: continue supplementation 18: Left knee pain: continue Voltaren gel, Lidoderm 19: Tobacco use: Cessation counseling 20.  Dysphagia improved s/p PEG 8/25, cannot remove earlier than 04/25/22, pt now eating regular diet   -this has been discussed with pt 21. Low Albumin 3.0 on 9/19, encourage protein intake  22. Constipation  -Pt reports LBM 9/22, continue to monitor  LOS: 6 days A FACE TO FACE EVALUATION WAS PERFORMED  Meredith Staggers 04/13/2022, 9:04 AM

## 2022-04-14 DIAGNOSIS — R790 Abnormal level of blood mineral: Secondary | ICD-10-CM

## 2022-04-14 DIAGNOSIS — F17200 Nicotine dependence, unspecified, uncomplicated: Secondary | ICD-10-CM

## 2022-04-14 DIAGNOSIS — F329 Major depressive disorder, single episode, unspecified: Secondary | ICD-10-CM

## 2022-04-14 DIAGNOSIS — L853 Xerosis cutis: Secondary | ICD-10-CM

## 2022-04-14 LAB — CBC
HCT: 32.1 % — ABNORMAL LOW (ref 39.0–52.0)
Hemoglobin: 10 g/dL — ABNORMAL LOW (ref 13.0–17.0)
MCH: 28.1 pg (ref 26.0–34.0)
MCHC: 31.2 g/dL (ref 30.0–36.0)
MCV: 90.2 fL (ref 80.0–100.0)
Platelets: 244 10*3/uL (ref 150–400)
RBC: 3.56 MIL/uL — ABNORMAL LOW (ref 4.22–5.81)
RDW: 17.2 % — ABNORMAL HIGH (ref 11.5–15.5)
WBC: 7.7 10*3/uL (ref 4.0–10.5)
nRBC: 0 % (ref 0.0–0.2)

## 2022-04-14 LAB — BASIC METABOLIC PANEL
Anion gap: 10 (ref 5–15)
BUN: 7 mg/dL (ref 6–20)
CO2: 28 mmol/L (ref 22–32)
Calcium: 9.1 mg/dL (ref 8.9–10.3)
Chloride: 102 mmol/L (ref 98–111)
Creatinine, Ser: 0.54 mg/dL — ABNORMAL LOW (ref 0.61–1.24)
GFR, Estimated: >60 mL/min (ref >60)
Glucose, Bld: 136 mg/dL — ABNORMAL HIGH (ref 70–99)
Potassium: 4.1 mmol/L (ref 3.5–5.1)
Sodium: 140 mmol/L (ref 135–145)

## 2022-04-14 LAB — GLUCOSE, CAPILLARY
Glucose-Capillary: 197 mg/dL — ABNORMAL HIGH (ref 70–99)
Glucose-Capillary: 153 mg/dL — ABNORMAL HIGH (ref 70–99)
Glucose-Capillary: 124 mg/dL — ABNORMAL HIGH (ref 70–99)
Glucose-Capillary: 143 mg/dL — ABNORMAL HIGH (ref 70–99)

## 2022-04-14 MED ORDER — HYDROCERIN EX CREA
TOPICAL_CREAM | Freq: Two times a day (BID) | CUTANEOUS | Status: DC
  Filled 2022-04-14: qty 113

## 2022-04-14 MED ORDER — CARVEDILOL 12.5 MG PO TABS
12.5000 mg | ORAL_TABLET | Freq: Two times a day (BID) | ORAL | Status: DC
  Administered 2022-04-15 – 2022-04-17 (×5): 12.5 mg via ORAL
  Filled 2022-04-14 (×6): qty 1

## 2022-04-14 MED ORDER — NICOTINE 14 MG/24HR TD PT24
14.0000 mg | MEDICATED_PATCH | Freq: Every day | TRANSDERMAL | Status: DC
  Administered 2022-04-14 – 2022-04-29 (×16): 14 mg via TRANSDERMAL
  Filled 2022-04-14 (×16): qty 1

## 2022-04-14 NOTE — Plan of Care (Signed)
Goal downgraded due to slower than anticipated progress Problem: RH Problem Solving Goal: LTG Patient will demonstrate problem solving for (SLP) Description: LTG:  Patient will demonstrate problem solving for basic/complex daily situations with cues  (SLP) Flowsheets (Taken 04/14/2022 1626) LTG: Patient will demonstrate problem solving for (SLP): Complex daily situations LTG Patient will demonstrate problem solving for: Minimal Assistance - Patient > 75%

## 2022-04-14 NOTE — Consult Note (Signed)
Neuropsychological Consultation   Patient:   Samuel Chavez   DOB:   1976/05/20  MR Number:  [id number]  Location:  [place] [facility] [place] [id number] [place] [address] Dept: [place]: [phone]           Date of Service:   04/14/2022  Start Time:   9 AM End Time:   10 AM  Provider/Observer:  Ilean Skill, Psy.D.       Clinical Neuropsychologist       Billing Code/Service: [phone]  Chief Complaint:    Samuel Chavez is a 46 year old male who presented to the emergency department at [facility] with complaints of increased lower extremity edema despite medication regimen.  During triage she was found to be hypoxic and placed on BiPAP.  His respiratory status deteriorated and he required intubation and mechanical ventilation.  Patient with history of obstructive sleep apnea, congestive heart failure, Bell's palsy of the left face, type 2 diabetes, morbid obesity, diabetic neuropathy, pulmonary hypertension, COPD and ongoing tobacco use.  Patient underwent trach and also had issues with kidney due to cardiorenal syndrome.  Patient was transition to a trach collar on 8/30.  After therapy evaluations patient was deemed to require inpatient physical medicine and rehab services secondary to dysfunction due to respiratory failure, congestive heart failure and morbid obesity.  Reason for Service:  Patient was referred for neuropsychological consultation due to coping and adjustment issues with significant respiratory and cardio illness.  Below is the HPI for the current admission.  HPI: Samuel Chavez is a 46 year old male who presented to the emergency department on 7/at [facility] with complaints of increased lower extremity edema despite taking home torsemide. During triage was found to be hypoxic and placed on BiPAP.  He was initially treated with Lasix, Solu-Medrol and  bronchodilators.  His respiratory status deteriorated and he required intubation and mechanical ventilation.  Comorbid conditions include obstructive sleep apnea with question of whether he had sleep study or not, congestive heart failure, Bell's palsy of the left face, type 2 diabetes mellitus, morbid obesity, diabetic neuropathy, pulmonary hypertension, COPD and ongoing tobacco use.  Nephrology was consulted on 7/27 with acute kidney injury with likely due to cardiorenal syndrome.  He was placed on heparin infusion and vascular surgery consulted for concern for pulmonary embolism.  The patient underwent tracheostomy by Dr. Richardson Landry on 8/8.  Cardiology was consulted on 8/10.  Recent echocardiogram showed EF of 40 to 45%.  He developed fever postoperatively and infectious disease consultation was obtained and the patient was started on Zosyn.  Treated for MRSA pneumonia with linezolid.  ENT was reconsulted for purulent nasal drainage and protrusion of tongue.  He underwent diagnostic nasal endoscopy.  Tongue laceration noted and Peridex ordered.  Prevotella sinusitis treated Unasyn.  He had gradual improvement in ventilatory requirements.  He underwent gastrostomy tube placement on 8/25.  He was transitioned to trach collar on 8/30.  Passy-Muir valve and diet advancement on 8/31. The patient requires inpatient physical medicine and rehabilitation evaluations and treatment secondary to dysfunction due to respiratory failure, congestive heart failure, morbid obesity.  Current Status:  Patient was awake and alert as I entered the room with good mental status and cognition.  He did acknowledge significant frustration with his deteriorating physical status and understood the reasons for his ongoing rehab efforts.  Patient reports that he has had some degree of anhedonia and frustration but knows  that he needs to get stronger and improve so he can help with his kids and grandkids.  The patient does appear to be having  some degree of reactive depressive symptomatology but I suspect that there is been some depression ongoing for some time.  Behavioral Observation: Samuel Chavez  presents as a 46 y.o.-year-old Right handed African American Male who appeared his stated age. his dress was Appropriate and he was Well Groomed and his manners were Appropriate to the situation.  his participation was indicative of Appropriate and Redirectable behaviors.  There were physical disabilities noted.  he displayed an appropriate level of cooperation and motivation.     Interactions:    Active Appropriate  Attention:   abnormal and attention span appeared shorter than expected for age  Memory:   within normal limits; recent and remote memory intact  Visuo-spatial:  not examined  Speech (Volume):  low  Speech:   normal; normal  Thought Process:  Coherent and Relevant  Though Content:  WNL; not suicidal and not homicidal  Orientation:   person, place, time/date, and situation  Judgment:   Fair  Planning:   Fair  Affect:    Flat and Lethargic  Mood:    Dysphoric  Insight:   Fair  Intelligence:   normal  Medical History:   Past Medical History:  Diagnosis Date   Diabetes mellitus without complication ([place])    Hypertension    Pancreatitis          Patient Active Problem List   Diagnosis Date Noted   Reactive depression    Neuropathic pain    Critical illness myopathy 04/08/2022   Respiratory failure ([place]) 04/07/2022   Constipation 03/25/2022   Pressure injury of skin 03/08/2022   Renal failure (ARF), acute on chronic ([place]) 02/28/2022   Hospital-acquired pneumonia 02/28/2022   ARDS (adult respiratory distress syndrome) ([place]) 02/28/2022   Acute heart failure with preserved ejection fraction ([place])    Acute respiratory failure ([place]) 02/11/2022   Acute respiratory failure with hypoxia and hypercarbia ([place]) 02/11/2022   Acute on chronic systolic CHF (congestive heart failure) ([place]) 02/11/2022    Hypertensive urgency 02/11/2022   Type 2 diabetes mellitus without complication, without long-term current use of insulin ([place]) 02/11/2022   OBSTRUCTIVE SLEEP APNEA 04/30/2009   BELL'S PALSY, HX OF 04/30/2009     Psychiatric History:  No prior psychiatric history noted.  Family Med/Psych History: History reviewed. No pertinent family history.  Risk of Suicide/Violence: low patient denies any suicidal or homicidal ideation.  Impression/DX:  Samuel Chavez is a 46 year old male who presented to the emergency department at [facility] with complaints of increased lower extremity edema despite medication regimen.  During triage she was found to be hypoxic and placed on BiPAP.  His respiratory status deteriorated and he required intubation and mechanical ventilation.  Patient with history of obstructive sleep apnea, congestive heart failure, Bell's palsy of the left face, type 2 diabetes, morbid obesity, diabetic neuropathy, pulmonary hypertension, COPD and ongoing tobacco use.  Patient underwent trach and also had issues with kidney due to cardiorenal syndrome.  Patient was transition to a trach collar on 8/30.  After therapy evaluations patient was deemed to require inpatient physical medicine and rehab services secondary to dysfunction due to respiratory failure, congestive heart failure and morbid obesity.  Patient was awake and alert as I entered the room with good mental status and cognition.  He did acknowledge significant frustration with his deteriorating physical status  and understood the reasons for his ongoing rehab efforts.  Patient reports that he has had some degree of anhedonia and frustration but knows that he needs to get stronger and improve so he can help with his kids and grandkids.  The patient does appear to be having some degree of reactive depressive symptomatology but I suspect that there is been some depression ongoing for some  time.  Disposition/Plan:  Today we worked on coping and adjustment issues with significant debility due to respiratory failure.  I had a discussion with the patient's attending after my visit with the patient and the attending will review potential of adding an SSRI medication with the patient tomorrow morning.  Diagnosis:  Reactive depression         Electronically Signed   _______________________ Ilean Skill, Psy.D. Clinical Neuropsychologist

## 2022-04-14 NOTE — Progress Notes (Signed)
Occupational Therapy Session Note  Patient Details  Name: Samuel Chavez MRN: [id number] Date of Birth: 05/02/1976  Session 1  Today's Date: 04/14/2022 OT Individual Time: 3220-2542 OT Individual Time Calculation (min): 70 min   Session 2  Today's Date: 04/14/2022 OT Individual Time: 1300-1330 OT Individual Time Calculation (min): 30 min    Short Term Goals: Week 1:  OT Short Term Goal 1 (Week 1): Pt will don LB clothing with min A OT Short Term Goal 2 (Week 1): Pt will complete sit > stand with min A OT Short Term Goal 3 (Week 1): Pt will complete a stand pivot transfer with max A of 1 OT Short Term Goal 4 (Week 1): Pt will tolerate one minute of standing with no desaturation to demo improved activity tolerance  Skilled Therapeutic Interventions/Progress Updates:    Session 1 Pt received supine, sleeping, easily awoken but slow to get started this morning. He reported high levels of back pain and RN entered to administer pain medication. Much improved trach stoma closure. Pt agreeable to try shower this session. Bariatric BSC placed in walk in shower. He came to EOB with very elevated HOB and heavy use of bed rails but (S) overall with this method. He completed a stand pivot transfer with the RW with min A. He was taken via w/c to the bathroom and he completed 4 steps with the RW to the bariatric BSC with min A. He compelted bathing seated, OT providing distant (S) d/t modesty request. His daughter was able to provide assist for foot hygiene and hair care as he needed. He required extra time and min A from his daughter with cueing from OT to problem solve washing peri areas. Pt completed sit > stand from the bariatric BSC with CGA. Pt required min A to dry off distally. He returned to the w/c via stand pivot with min A using the bariatric BSC. His peg and trach stoma dressings were changed with MD present. Pt donned gown and his daughter assisted with applying lotion. Discussed home  accessibility and possible need for hospital bed at home. Pt also had several questions re peg removal and all were answered within OT scope. Pt left sitting up with his daughter present, all needs met.    Session 2 Pt received sitting in the w/c with no c/o pain. Agreeable to OT session. He was taken to the therapy gym. Pt completed intervals on the BUE ergometer to challenge BUE strength and endurance needed for increased independence with ADL transfers and IADLs. Intervals used to challenge cardiovascular endurance and maximize HR recovery with pedaling forward/backward. He was cued to increase effort and intensity. He returned to his room and was left sitting up with all needs met.     Therapy Documentation Precautions:  Precautions Precautions: Fall Precaution Comments: PEG, recently decannulated Restrictions Weight Bearing Restrictions: No Other Position/Activity Restrictions: HOB 30 deg- trach  Therapy/Group: Individual Therapy  Curtis Sites 04/14/2022, 6:35 AM

## 2022-04-14 NOTE — Progress Notes (Signed)
Physical Therapy Session Note  Patient Details  Name: Samuel Chavez MRN: [id number] Date of Birth: 12/23/1975  Today's Date: 04/14/2022 PT Individual Time: 1015-1115 PT Individual Time Calculation (min): 60 min   Short Term Goals: Week 1:  PT Short Term Goal 1 (Week 1): Patient will complete bed mobility with MinA PT Short Term Goal 2 (Week 1): Patient will perform stand pivot transfer with HDRW and MinA PT Short Term Goal 3 (Week 1): Patient will ambulate 25' with HDRW and MinA  Skilled Therapeutic Interventions/Progress Updates:  Patient greeted sitting upright in wheelchair with daughter present and requesting his breakfast be re-heated and given time to eat prior to PT treatment session. Patient provided ~15 minutes to eat breakfast and then was agreeable to treatment session. Patient reporting 4/10 pain in B knees with gait, however does not inhibit his ability to participate in treatment session. Entire treatment session completed on A.   Patient performed sit/stand with with HDRW x2 attempts and Min/ModA- While standing, therapist pulled up briefs.   Patient wheeled to rehab gym by therapist for time management.   Patient performed various sit/stands for gait with HDRW and CGA/MinA for initiating standing.   Patient gait trained x20', x35', x42'  with HDRW and CGA/MinA- VC for decreased cadence, improved postural extension and breathing with functional mobility. Patient required extended seated rest break between gait trials secondary to fatigue. VC for pursed lip breathing.   Seated Therex- B LAQ with 2.5#, 3 x 10 B hip flexion with 2.5#, 3 x 10  Patient propelled manual wheelchair ~40' in the direction of his room, however required therapist to push the wheelchair the remainder of the way secondary to poor endurance/activity tolerance and increased fatigue at end of treatment session.   Patient returned to his room sitting upright in wheelchair with daughter present and all  needs met.     Therapy Documentation Precautions:  Precautions Precautions: Fall Precaution Comments: PEG, recently decannulated Restrictions Weight Bearing Restrictions: No Other Position/Activity Restrictions: HOB 30 deg- trach   Therapy/Group: Individual Therapy  Sydney  Rafoth 04/14/2022, 7:53 AM  

## 2022-04-14 NOTE — Progress Notes (Addendum)
PROGRESS NOTE   Subjective/Complaints: Reports he is having cravings for tobacco. Reports he has some dry skin. He asks about medication for anxiety.  No additional concerns.   Review of Systems  Constitutional:  Negative for chills and fever.  Eyes:  Negative for double vision.  Respiratory:  Negative for cough and shortness of breath.   Cardiovascular:  Negative for chest pain and palpitations.  Gastrointestinal:  Negative for abdominal pain.  Genitourinary:  Negative for urgency.  Musculoskeletal:  Positive for back pain. Negative for joint pain.  Skin:        Dry skin  Neurological:  Positive for weakness. Negative for sensory change and speech change.  Psychiatric/Behavioral:  The patient is nervous/anxious.      Objective:   DG Knee 1-2 Views Right  Result Date: 04/12/2022 CLINICAL DATA:  Right knee pain, inpatient EXAM: RIGHT KNEE - 1-2 VIEW COMPARISON:  None Available. FINDINGS: No fracture, joint effusion or dislocation. No suspicious focal osseous lesions. Minimal patellofemoral and lateral compartment right knee osteoarthritis. Generalized soft tissue swelling. No suspicious focal osseous lesions. Tiny superior right patellar enthesophyte. No radiopaque foreign bodies. IMPRESSION: Generalized soft tissue swelling. Minimal patellofemoral and lateral compartment right knee osteoarthritis. No acute osseous abnormality. Electronically Signed   By: Ilona Sorrel M.D.   On: 04/12/2022 15:00   No results for input(s): "WBC", "HGB", "HCT", "PLT" in the last 72 hours.  No results for input(s): "NA", "K", "CL", "CO2", "GLUCOSE", "BUN", "CREATININE", "CALCIUM" in the last 72 hours.   Intake/Output Summary (Last 24 hours) at 04/14/2022 0804 Last data filed at 04/14/2022 8466 Gross per 24 hour  Intake 240 ml  Output 2350 ml  Net -2110 ml      Pressure Injury 03/08/22 Buttocks Left Stage 3 -  Full thickness tissue loss.  Subcutaneous fat may be visible but bone, tendon or muscle are NOT exposed. open, red/pink and yellow; WOC updated Staging to Stage 3 03/10/22 (Active)  03/08/22 1757  Location: Buttocks  Location Orientation: Left  Staging: Stage 3 -  Full thickness tissue loss. Subcutaneous fat may be visible but bone, tendon or muscle are NOT exposed.  Wound Description (Comments): open, red/pink and yellow; WOC updated Staging to Stage 3 03/10/22  Present on Admission: No    Physical Exam: Vital Signs Blood pressure (!) 168/97, pulse 98, temperature (!) 97.5 F (36.4 C), resp. rate 20, height 5\' 9"  (1.753 m), weight (!) 159.3 kg, SpO2 92 %.  Constitutional: No distress . Vital signs reviewed. obese HEENT: NCAT, EOMI, oral membranes moist Neck: supple Cardiovascular: RRR without murmur. No JVD    Respiratory/Chest: CTA Bilaterally without wheezes or rales. Normal effort    GI/Abdomen: BS +, non-tender, non-distended Ext: no clubbing, cyanosis, or edema Psych: pleasant and cooperative   Skin: No evidence of breakdown, no evidence of rash. Trach stoma dressed. PEG site clean, intact. Right buttock region not visualized. Dry skin on arms and legs.  Neurologic: Drowsy today, Cranial nerves II through XII intact, motor strength is 5/5 in bilateral deltoid, bicep, tricep, grip, 3-hip flexor, 3- knee extensors,4/5 ankle dorsiflexor and plantar flexor Sensory exam normal sensation to light touch  in bilateral upper  and lower extremities   Musculoskeletal: Right knee tenderness at/around patella  Assessment/Plan: 1. Functional deficits which require 3+ hours per day of interdisciplinary therapy in a comprehensive inpatient rehab setting. Physiatrist is providing close team supervision and 24 hour management of active medical problems listed below. Physiatrist and rehab team continue to assess barriers to discharge/monitor patient progress toward functional and medical goals  Care Tool:  Bathing    Body  parts bathed by patient: Right arm, Left arm, Abdomen, Front perineal area, Chest, Right upper leg, Left upper leg, Right lower leg, Face   Body parts bathed by helper: Buttocks     Bathing assist Assist Level: Moderate Assistance - Patient 50 - 74%     Upper Body Dressing/Undressing Upper body dressing   What is the patient wearing?: Hospital gown only    Upper body assist Assist Level: Moderate Assistance - Patient 50 - 74%    Lower Body Dressing/Undressing Lower body dressing      What is the patient wearing?: Pants     Lower body assist Assist for lower body dressing: Moderate Assistance - Patient 50 - 74%     Toileting Toileting Toileting Activity did not occur (Clothing management and hygiene only): N/A (no void or bm)  Toileting assist Assist for toileting: 2 Helpers     Transfers Chair/bed transfer  Transfers assist     Chair/bed transfer assist level: Moderate Assistance - Patient 50 - 74%     Locomotion Ambulation   Ambulation assist      Assist level: Maximal Assistance - Patient 25 - 49% Assistive device:  (HDRW) Max distance: 8'   Walk 10 feet activity   Assist  Walk 10 feet activity did not occur: Safety/medical concerns (Unable to assess gait, curb, stair mobility and picking up an item from the floor at this time secondary to impaired endurance/activity tolerance and decreased global strength)        Walk 50 feet activity   Assist Walk 50 feet with 2 turns activity did not occur: Safety/medical concerns         Walk 150 feet activity   Assist Walk 150 feet activity did not occur: Safety/medical concerns         Walk 10 feet on uneven surface  activity   Assist Walk 10 feet on uneven surfaces activity did not occur: Safety/medical concerns         Wheelchair     Assist Is the patient using a wheelchair?: Yes Type of Wheelchair: Manual    Wheelchair assist level: Total Assistance - Patient < 25% Max  wheelchair distance: Patient propelled manual wheelchair ~15-20' and therapist completed the remaining distance with total assist    Wheelchair 50 feet with 2 turns activity    Assist        Assist Level: Maximal Assistance - Patient 25 - 49%   Wheelchair 150 feet activity     Assist      Assist Level: Total Assistance - Patient < 25%   Blood pressure (!) 168/97, pulse 98, temperature (!) 97.5 F (36.4 C), resp. rate 20, height 5\' 9"  (1.753 m), weight (!) 159.3 kg, SpO2 92 %.    Medical Problem List and Plan: 1. 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 Had discussion about rehab expectations and safety with patient. He expressed an understanding and will ask for help next time he wants to get out of bed.    2.  Antithrombotics: -DVT/anticoagulation:  Pharmaceutical: Eliquis             -antiplatelet therapy: none 3. Pain Management: Tylenol as needed             -Neurontin BID, Lidoderm daily; oxycodone and Flexeril prn for sciatica and neuropathy  9/20 Gabapentin 600mg  BID increased to 600mg  TID  -Continue oxycodone current dose for now, continue to monitor for effect of increased gabapentin, consider addition of duloxetine  9/24 knee pain resolved after yesterday's fall  -reviewed films with pt--mild OA 4. Mood/Behavior/Sleep: LCSW to evaluate and provide emotional support             -antipsychotic agents: n/a             -depression: continue Lexapro  -Ativan PRN anxiety--told pt he has it available to use. It's not scheduled  -9/25 discussed with him that ativan available if needed for anxiety, he was seen by neuropsychology today 5. Neuropsych/cognition: This patient is capable of making decisions on his own behalf. 6. Skin/Wound Care: Routine skin care checks             -right ischial DTI              -routine trach and PEG care 7. 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              -continue Duo neb prn  -9/20 pulm consult- Decannulated, appreciate assistance  9: Heart failure with preserved EF:             Continue Coreg 6.25 mg BID             -daily weight  9/21 daily weight overall decreased from 167.8 kg on 9/18, continue to monitor Filed Weights   04/11/22 0500 04/12/22 0446 04/14/22 0500  Weight: (!) 156.5 kg (!) 159.1 kg (!) 159.3 kg   10: Left ear wax: continue Debrox BID 11: Morbid obesity: BMI 54.64; dietary counseling 12: LLE sciatica: continue Neurontin and Flexeril 13: DM: CBGs 4 times daily             -continue Levemir 22 units BID             -SSI  CBG (last 3)  Recent Labs    04/13/22 1626 04/13/22 2104 04/14/22 0612  GLUCAP 116* 174* 197*      -9/25 CBGs a litter higher this AM but overall well controlled, continue to monitor trend 14: Decreased serum magnesium:   -Recheck tomorrow 15: Anemia:   -HGB stable at 10.0 9/25 16: RUE brachial DVT secondary to PICC: on Eliquis 17: Vitamin D deficiency: continue supplementation 18: Left knee pain: continue Voltaren gel, Lidoderm 19: Tobacco use: Cessation counseling 20.  Dysphagia improved s/p PEG 8/25, cannot remove earlier than 04/25/22, pt now eating regular diet   -this has been discussed with pt 21. Low Albumin 3.0 on 9/19, encourage protein intake  22. Constipation  -  Pt reports LBM 9/22, continue to monitor 23. Tobacco use   -Start nicotine patch 24. Dry Skin  -Eucerin lotion 25. Htn  -Increase coreg to 12.5mg  BID      LOS: 7 days A FACE TO FACE EVALUATION WAS PERFORMED  Jennye Boroughs 04/14/2022, 8:04 AM

## 2022-04-14 NOTE — Progress Notes (Signed)
Speech Language Pathology Daily Session Note  Patient Details  Name: Samuel Chavez MRN: [id number] Date of Birth: 25-May-1976  Today's Date: 04/14/2022 SLP Individual Time: 5498-2641 SLP Individual Time Calculation (min): 43 min  Short Term Goals: Week 1: SLP Short Term Goal 1 (Week 1): Patient will demonstrate complex problem solving for functional tasks with Min A verbal and visual cues. SLP Short Term Goal 2 (Week 1): Patient will recall new, daily information with use of external aids with Mod verbal and visual cues.  Skilled Therapeutic Interventions: Skilled ST treatment focused on cognitive goals. Pt was sitting upright in wheelchair on arrival and accompanied by family member. Pt was mildly distracted by external stimuli d/t conversation in background of family member on phone which improved when phone call was eventually terminated per gentle request of  SLP.   SLP and pt completing medication management task to increase awareness of current medication regime. Prior to education, pt reported being "pretty familiar" with current medications, however when asked to elaborate he was only able to report taking "pain medication", and uncertain of names of prescriptions, dose, purpose, etc. SLP reviewed medications and wrote down information on med chart containing: name, purpose, dose, frequency. Pt was able to locate information on chart with mod I.   SLP facilitated comprehension of medication labels with overall mod A to understand differences with various prescription frequencies (e.g., "take one tablet twice daily" vs "take two tablets once daily"). Pt participated in problem solving scenarios pertaining to medications with overall mod A for safety considerations and carry over of previously learned information.   SLP facilitated a complex medication organization and problem solving task by identifying purposeful organization errors in daily BID pillbox with max fading to mod A to achieve  50% accuracy, and min A for generating appropriate solutions.  At this time, it would be beneficial for pt to have direct supervision/assist with managing medications at discharge. Will continue efforts in upcoming session(s).   Patient was left in bed with alarm activated and immediate needs within reach at end of session. Continue per current plan of care.       Pain Pain Assessment Pain Scale: 0-10 Pain Score: 6  Pain Type: Chronic pain Pain Location: Back Pain Orientation: Lower Pain Descriptors / Indicators: Aching Pain Intervention(s): Medication (See eMAR)  Therapy/Group: Individual Therapy  Patty Sermons 04/14/2022, 3:46 PM

## 2022-04-15 LAB — BASIC METABOLIC PANEL
Anion gap: 7 (ref 5–15)
BUN: 7 mg/dL (ref 6–20)
CO2: 27 mmol/L (ref 22–32)
Calcium: 8.5 mg/dL — ABNORMAL LOW (ref 8.9–10.3)
Chloride: 103 mmol/L (ref 98–111)
Creatinine, Ser: 0.54 mg/dL — ABNORMAL LOW (ref 0.61–1.24)
GFR, Estimated: >60 mL/min (ref >60)
Glucose, Bld: 151 mg/dL — ABNORMAL HIGH (ref 70–99)
Potassium: 3.9 mmol/L (ref 3.5–5.1)
Sodium: 137 mmol/L (ref 135–145)

## 2022-04-15 LAB — MAGNESIUM: Magnesium: 1.5 mg/dL — ABNORMAL LOW (ref 1.7–2.4)

## 2022-04-15 LAB — GLUCOSE, CAPILLARY
Glucose-Capillary: 190 mg/dL — ABNORMAL HIGH (ref 70–99)
Glucose-Capillary: 138 mg/dL — ABNORMAL HIGH (ref 70–99)
Glucose-Capillary: 99 mg/dL (ref 70–99)
Glucose-Capillary: 165 mg/dL — ABNORMAL HIGH (ref 70–99)

## 2022-04-15 MED ORDER — MAGNESIUM OXIDE -MG SUPPLEMENT 400 (240 MG) MG PO TABS
400.0000 mg | ORAL_TABLET | Freq: Every day | ORAL | Status: DC
  Administered 2022-04-15 – 2022-04-21 (×7): 400 mg via ORAL
  Filled 2022-04-15 (×7): qty 1

## 2022-04-15 MED ORDER — ORAL CARE MOUTH RINSE: 15.0000 mL | OROMUCOSAL | Status: DC | PRN

## 2022-04-15 NOTE — Progress Notes (Signed)
Occupational Therapy Weekly Progress Note  Patient Details  Name: Samuel Chavez MRN: [id number] Date of Birth: 1975/10/07  Beginning of progress report period: April 08, 2022 End of progress report period: April 15, 2022  Today's Date: 04/15/2022 OT Individual Time: 8185-9093 OT Individual Time Calculation (min): 40 min  and Today's Date: 04/15/2022 OT Missed Time: 20 Minutes Missed Time Reason: Patient fatigue   Patient has met 4 of 4 short term goals.  Samuel Chavez has made excellent progress in CIR so far. He has progressed significantly with his ADL transfers, requiring min A from moderate to high level surfaces and occasional mod A from lower surfaces. He is completing showers now and requiring min A overall. He is requiring min-mod A for LB ADLs and toileting tasks. He is able to tolerate being OOB for most of the day. His children are supportive and alternate being present in the room. Family education will take place more formally closer to d/c.   Patient continues to demonstrate the following deficits: muscle weakness, decreased cardiorespiratoy endurance, decreased problem solving and delayed processing, and decreased standing balance, decreased postural control, and decreased balance strategies and therefore will continue to benefit from skilled OT intervention to enhance overall performance with BADL.  Patient progressing toward long term goals..  Continue plan of care.  OT Short Term Goals Week 1:  OT Short Term Goal 1 (Week 1): Pt will don LB clothing with min A OT Short Term Goal 1 - Progress (Week 1): Met OT Short Term Goal 2 (Week 1): Pt will complete sit > stand with min A OT Short Term Goal 2 - Progress (Week 1): Met OT Short Term Goal 3 (Week 1): Pt will complete a stand pivot transfer with max A of 1 OT Short Term Goal 3 - Progress (Week 1): Met OT Short Term Goal 4 (Week 1): Pt will tolerate one minute of standing with no desaturation to demo improved activity  tolerance OT Short Term Goal 4 - Progress (Week 1): Met Week 2:  OT Short Term Goal 1 (Week 2): Pt will complete toileting tasks with min A OT Short Term Goal 2 (Week 2): Pt will complete stand pivot transfers with CGA OT Short Term Goal 3 (Week 2): Pt will complete one ADL in standing (1 min)  to demo improved activity tolerance OT Short Term Goal 4 (Week 2): Pt will complete bed mobility with no more than CGA  Skilled Therapeutic Interventions/Progress Updates:    Pt received supine sleeping soundly. Upon waking him up he reports he "just got into bed" and fell asleep. He requested to rest for a bit longer and missed first 20 min of session d/t fatigue. He required more than reasonable time to come to EOB, including verbal and tactile cueing d/t fatigue. Discussed sleep hygiene and encouraged pt to talk to MD re potential use of supplements/ medications. Provided edu on importance of sleep for recovery. (S) for bed mobility with bed rail use. Max A to don socks. He completed a sit > stand with CGA. Min A ambulatory transfer to the w/c, about 5 ft with a standard RW. Pt requested to use an incentive spirometer and one was provided for him. Assisted LPN in gathering vitals as pt sat. He was left sitting up with all needs met.   Therapy Documentation Precautions:  Precautions Precautions: Fall Precaution Comments: PEG, recently decannulated Restrictions Weight Bearing Restrictions: No Other Position/Activity Restrictions: HOB 30 deg- trach    Therapy/Group: Individual Therapy  Samuel Chavez 04/15/2022, 6:41 AM

## 2022-04-15 NOTE — Progress Notes (Signed)
Physical Therapy Session Note  Patient Details  Name: Samuel Chavez MRN: [id number] Date of Birth: 1975/08/18  Today's Date: 04/15/2022 PT Individual Time: 9373-4287 PT Individual Time Calculation (min): 29 min   Short Term Goals: Week 1:  PT Short Term Goal 1 (Week 1): Patient will complete bed mobility with MinA PT Short Term Goal 2 (Week 1): Patient will perform stand pivot transfer with HDRW and MinA PT Short Term Goal 3 (Week 1): Patient will ambulate 53' with HDRW and MinA  Skilled Therapeutic Interventions/Progress Updates: Pt presents sitting in w/c and requires encouragement  to participate.  Pt states generalized pain, sleeping in w/c.  Pt agreeable to LE there ex.  Pt performed calf raises, LAQ, hip flexion 3 x 10 w/ cueing for complete ROM and hold/squeeze at end range.   Pt required education on breathing technique but also counting during ther ex for improved breathing.  Pt remained sitting in w/c to eat breakfast w/ all needs in reach and family member present.  Emptied 500 ml of urine from urinal, NT to chart.    Therapy Documentation Precautions:  Precautions Precautions: Fall Precaution Comments: PEG, recently decannulated Restrictions Weight Bearing Restrictions: No Other Position/Activity Restrictions: HOB 30 deg- trach General:   Vital Signs: Therapy Vitals Temp: 98.2 F (36.8 C) Pulse Rate: 95 Resp: 19 BP: (!) 152/93 Patient Position (if appropriate): Lying Oxygen Therapy SpO2: 98 % O2 Device: Room Air Pain:5/10 general pain  Pain Assessment Pain Scale: 0-10 Pain Score: 8  Pain Location: Generalized Pain Intervention(s): Medication (See eMAR)       Therapy/Group: Individual Therapy  Ladoris Gene 04/15/2022, 9:16 AM

## 2022-04-15 NOTE — Progress Notes (Signed)
PROGRESS NOTE   Subjective/Complaints: Pt requests regular diet.  Says he cant eat current food. He requests grounds pass today. Reports he had BM yesterday.   Review of Systems  Constitutional:  Negative for chills and fever.  Eyes:  Negative for double vision.  Respiratory:  Negative for cough and shortness of breath.   Cardiovascular:  Negative for chest pain and palpitations.  Gastrointestinal:  Negative for abdominal pain, constipation, nausea and vomiting.  Genitourinary:  Negative for urgency.  Musculoskeletal:  Positive for back pain. Negative for joint pain.  Skin:        Dry skin  Neurological:  Positive for weakness. Negative for sensory change and speech change.  Psychiatric/Behavioral:  The patient is nervous/anxious.      Objective:   No results found. Recent Labs    04/14/22 0906  WBC 7.7  HGB 10.0*  HCT 32.1*  PLT 244    Recent Labs    04/14/22 0906 04/15/22 0535  NA 140 137  K 4.1 3.9  CL 102 103  CO2 28 27  GLUCOSE 136* 151*  BUN 7 7  CREATININE 0.54* 0.54*  CALCIUM 9.1 8.5*     Intake/Output Summary (Last 24 hours) at 04/15/2022 0834 Last data filed at 04/15/2022 0600 Gross per 24 hour  Intake --  Output 975 ml  Net -975 ml      Pressure Injury 03/08/22 Buttocks Left Stage 3 -  Full thickness tissue loss. Subcutaneous fat may be visible but bone, tendon or muscle are NOT exposed. open, red/pink and yellow; WOC updated Staging to Stage 3 03/10/22 (Active)  03/08/22 1757  Location: Buttocks  Location Orientation: Left  Staging: Stage 3 -  Full thickness tissue loss. Subcutaneous fat may be visible but bone, tendon or muscle are NOT exposed.  Wound Description (Comments): open, red/pink and yellow; WOC updated Staging to Stage 3 03/10/22  Present on Admission: No    Physical Exam: Vital Signs Blood pressure (!) 152/93, pulse 95, temperature 98.2 F (36.8 C), resp. rate 19, height  5\' 9"  (1.753 m), weight (!) 163.4 kg, SpO2 98 %.  Constitutional: No distress . Vital signs reviewed. obese HEENT: NCAT, Conjugate gaze, oral membranes moist Neck: supple Cardiovascular: RRR without murmur. No JVD    Respiratory/Chest: CTA Bilaterally without wheezes or rales. Normal effort    GI/Abdomen: BS +, non-tender, non-distended, + PEG tube in place, site clean and dry Ext: no clubbing, cyanosis, or edema Psych: appropriate and cooperative   Skin: No evidence of breakdown, no evidence of rash. Trach stoma dressed. Right buttock region not visualized. Dry skin on arms and legs.  Neurologic: Alert, Cranial nerves II through XII grossly intact, motor strength is 5/5 in bilateral deltoid, bicep, tricep, grip, 3-hip flexor, 3- knee extensors,4/5 ankle dorsiflexor and plantar flexor Sensory exam normal sensation to light touch  in bilateral upper and lower extremities   Musculoskeletal: Right knee tenderness at/around patella  Assessment/Plan: 1. Functional deficits which require 3+ hours per day of interdisciplinary therapy in a comprehensive inpatient rehab setting. Physiatrist is providing close team supervision and 24 hour management of active medical problems listed below. Physiatrist and rehab team continue to assess  barriers to discharge/monitor patient progress toward functional and medical goals  Care Tool:  Bathing    Body parts bathed by patient: Right arm, Left arm, Abdomen, Front perineal area, Chest, Right upper leg, Left upper leg, Right lower leg, Face   Body parts bathed by helper: Buttocks     Bathing assist Assist Level: Moderate Assistance - Patient 50 - 74%     Upper Body Dressing/Undressing Upper body dressing   What is the patient wearing?: Hospital gown only    Upper body assist Assist Level: Moderate Assistance - Patient 50 - 74%    Lower Body Dressing/Undressing Lower body dressing      What is the patient wearing?: Pants     Lower body  assist Assist for lower body dressing: Moderate Assistance - Patient 50 - 74%     Toileting Toileting Toileting Activity did not occur (Clothing management and hygiene only): N/A (no void or bm)  Toileting assist Assist for toileting: 2 Helpers     Transfers Chair/bed transfer  Transfers assist     Chair/bed transfer assist level: Moderate Assistance - Patient 50 - 74%     Locomotion Ambulation   Ambulation assist      Assist level: Maximal Assistance - Patient 25 - 49% Assistive device:  (HDRW) Max distance: 8'   Walk 10 feet activity   Assist  Walk 10 feet activity did not occur: Safety/medical concerns (Unable to assess gait, curb, stair mobility and picking up an item from the floor at this time secondary to impaired endurance/activity tolerance and decreased global strength)        Walk 50 feet activity   Assist Walk 50 feet with 2 turns activity did not occur: Safety/medical concerns         Walk 150 feet activity   Assist Walk 150 feet activity did not occur: Safety/medical concerns         Walk 10 feet on uneven surface  activity   Assist Walk 10 feet on uneven surfaces activity did not occur: Safety/medical concerns         Wheelchair     Assist Is the patient using a wheelchair?: Yes Type of Wheelchair: Manual    Wheelchair assist level: Total Assistance - Patient < 25% Max wheelchair distance: Patient propelled manual wheelchair ~15-20' and therapist completed the remaining distance with total assist    Wheelchair 50 feet with 2 turns activity    Assist        Assist Level: Maximal Assistance - Patient 25 - 49%   Wheelchair 150 feet activity     Assist      Assist Level: Total Assistance - Patient < 25%   Blood pressure (!) 152/93, pulse 95, temperature 98.2 F (36.8 C), resp. rate 19, height 5\' 9"  (1.753 m), weight (!) 163.4 kg, SpO2 98 %.    Medical Problem List and Plan: 1. 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 Had discussion about rehab expectations and safety with patient. He expressed an understanding and will ask for help next time he wants to get out of bed.     -Grounds pass ordered 2.  Antithrombotics: -DVT/anticoagulation:  Pharmaceutical: Eliquis             -  antiplatelet therapy: none 3. Pain Management: Tylenol as needed             -Neurontin BID, Lidoderm daily; oxycodone and Flexeril prn for sciatica and neuropathy  9/20 Gabapentin 600mg  BID increased to 600mg  TID  -Continue oxycodone current dose for now, continue to monitor for effect of increased gabapentin, consider addition of duloxetine  9/24 knee pain resolved after yesterday's fall  -reviewed films with pt--mild OA 4. Mood/Behavior/Sleep: LCSW to evaluate and provide emotional support             -antipsychotic agents: n/a             -depression: continue Lexapro  -Ativan PRN anxiety--told pt he has it available to use. It's not scheduled 5. Neuropsych/cognition: This patient is capable of making decisions on his own behalf. 6. Skin/Wound Care: Routine skin care checks             -right ischial DTI             -routine trach and PEG care 7. 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              -continue Duo neb prn  -9/20 pulm consult- Decannulated, appreciate assistance  9: Heart failure with preserved EF:             Continue Coreg 6.25 mg BID             -daily weight  9/21 daily weight overall decreased from 167.8 kg on 9/18, continue to monitor Filed Weights    04/12/22 0446 04/14/22 0500 04/15/22 0500  Weight: (!) 159.1 kg (!) 159.3 kg (!) 163.4 kg   10: Left ear wax: continue Debrox BID 11: Morbid obesity: BMI 54.64; dietary counseling 12: LLE sciatica: continue Neurontin and Flexeril 13: DM: CBGs 4 times daily             -continue Levemir 22 units BID             -SSI  CBG (last 3)  Recent Labs    04/14/22 1623 04/14/22 2058 04/15/22 0613  GLUCAP 124* 143* 190*     -9/25 CBGs a litter higher this AM but overall well controlled, continue to monitor trend  9/26 Advised to continue carb modified diet but he says he cant eat the food and insists on regular diet. Diet changed to regular, monitor for response 14: Decreased serum magnesium:   -9/26 Mag-Ox 400mg  daily started 15: Anemia:   -HGB stable at 10.0 9/25 16: RUE brachial DVT secondary to PICC: on Eliquis 17: Vitamin D deficiency: continue supplementation 18: Left knee pain: continue Voltaren gel, Lidoderm 19: Tobacco use: Cessation counseling 20.  Dysphagia improved s/p PEG 8/25, cannot remove earlier than 04/25/22, pt now eating regular diet   -this has been discussed with pt 21. Low Albumin 3.0 on 9/19, encourage protein intake  22. Constipation  -Pt reports LBM 9/25, improved continue to monitor, Mag-ox started today may help with constipation 23. Tobacco use   -Start nicotine patch 24. Dry Skin  -Eucerin lotion 25. HTN  -9/26 Increased coreg to 12.5mg  BID last  night, monitor for response       LOS: 8 days A FACE TO FACE EVALUATION WAS PERFORMED  Jennye Boroughs 04/15/2022, 8:34 AM

## 2022-04-15 NOTE — Inpatient Diabetes Management (Signed)
Inpatient Diabetes Program Recommendations  AACE/ADA: New Consensus Statement on Inpatient Glycemic Control (2015)  Target Ranges:  Prepandial:   less than 140 mg/dL      Peak postprandial:   less than 180 mg/dL (1-2 hours)      Critically ill patients:  140 - 180 mg/dL   Lab Results  Component Value Date   GLUCAP 190 (H) 04/15/2022   HGBA1C 10.1 (H) 02/11/2022    Review of Glycemic Control  Diabetes history: DM 2  Current orders for Inpatient glycemic control:  Levemir 22 units bid Novolog 0-20 units tid  Ensure max tid between meals A1c 10.1%  Consulted for diet education.  Pt desires to be switched to regular diet to prevent himself from being restricted in this area and be more independent with food choices to learn how to control carbohydrate intake for the home setting. Discussed in detail diet modifications for the hospital setting and for continued practice in the home setting. Discouraged drinking regular juice and beverages, and described portion sizes to patient. Cautioned against ordering whatever he wants and to consider the carbohydrate foods and portion sizes. Gave handouts on food choices and apps to help with carbohydrate counting in the future.  Will follow glucose trends the next 24-48 hours on regular diet  Thanks,  Tama Headings RN, MSN, BC-ADM Inpatient Diabetes Coordinator Team Pager [phone] (8a-5p)

## 2022-04-15 NOTE — Progress Notes (Signed)
Speech Language Pathology Weekly Progress and Session Note  Patient Details  Name: [name] MRN: [id number] Date of Birth: 24-Apr-1976  Beginning of progress report period: April 07, 2022 End of progress report period: April 15, 2022  Today's Date: 04/15/2022 SLP Individual Time: 1400-1430 SLP Individual Time Calculation (min): 30 min  Short Term Goals: Week 1: SLP Short Term Goal 1 (Week 1): Patient will demonstrate complex problem solving for functional tasks with Min A verbal and visual cues. SLP Short Term Goal 1 - Progress (Week 1): Met SLP Short Term Goal 2 (Week 1): Patient will recall new, daily information with use of external aids with Mod verbal and visual cues. SLP Short Term Goal 2 - Progress (Week 1): Met    New Short Term Goals: Week 2: SLP Short Term Goal 1 (Week 2): Patient will demonstrate complex problem solving for functional tasks with supervision verbal and visual cues. SLP Short Term Goal 2 (Week 2): Patient will recall new, daily information with use of external aids with Min verbal and visual cues.  Weekly Progress Updates: Patient has made functional gains and has met 2 of 2 STGs this reporting period. Currently, patient is completing functional and mildly complex tasks safely in regards to problem solving and recall with use of strategies with overall Min-Mod A verbal and visual cues. Patient and family education is ongoing. Patient would benefit from continued skilled SLP intervention to maximize his cognitive functioning and overall functional independence prior to discharge.     Intensity: Minumum of 1-2 x/day, 30 to 90 minutes Frequency: 3 to 5 out of 7 days Duration/Length of Stay: 04/29/22 Treatment/Interventions: Cognitive remediation/compensation;Internal/external aids;Therapeutic Activities;Environmental controls;Cueing hierarchy;Functional tasks;Patient/family education   Daily Session  Skilled Therapeutic Interventions:  Skilled  treatment session focused on cognitive goals. SLP facilitated session by providing overall Min A verbal cues for functional problem solving during a basic money management task. Throughout session, patient recalling events from previous therapy sessions independently but required intermittent redirection to tasks due to verbosity. Patient left upright in the wheelchair with alarm on and all needs within reach. Continue with current plan of care.      Pain No/Denies Pain   Therapy/Group: Individual Therapy  Bren Borys 04/15/2022, 6:22 AM

## 2022-04-15 NOTE — Progress Notes (Signed)
Occupational Therapy Session Note  Patient Details  Name: Samuel Chavez MRN: [id number] Date of Birth: 02/08/1976  Today's Date: 04/15/2022 OT Individual Time: 1300-1350 OT Individual Time Calculation (min): 50 min    Short Term Goals: Week 2:  OT Short Term Goal 1 (Week 2): Pt will complete toileting tasks with min A OT Short Term Goal 2 (Week 2): Pt will complete stand pivot transfers with CGA OT Short Term Goal 3 (Week 2): Pt will complete one ADL in standing (1 min)  to demo improved activity tolerance OT Short Term Goal 4 (Week 2): Pt will complete bed mobility with no more than CGA  Skilled Therapeutic Interventions/Progress Updates:    Pt received sitting in the w/c with no c/o pain, agreeable to OT session. He was taken to the therapy gym via w/c. He completed 10 ft of functional mobility to the mat with the bariatric RW. He completed functional core strengthening exercises to improve sitting/standing balance. He required cueing for proper activation. He then 3 sets of 15. He then completed 2x5 blocked practice  sit <> stands without UE support. He then completed 15 ft of functional mobility with the RW with cueing for pacing and CGA overall. He returned to his room and was left sitting up with all needs met.   Therapy Documentation Precautions:  Precautions Precautions: Fall Precaution Comments: PEG, recently decannulated Restrictions Weight Bearing Restrictions: No Other Position/Activity Restrictions: HOB 30 deg- trach   Therapy/Group: Individual Therapy  Curtis Sites 04/15/2022, 1:15 PM

## 2022-04-15 NOTE — Progress Notes (Signed)
Physical Therapy Weekly Progress Note  Patient Details  Name: Samuel Chavez MRN: [id number] Date of Birth: Jan 21, 1976  Beginning of progress report period: April 08, 2022 End of progress report period: April 15, 2022  Today's Date: 04/15/2022 PT Individual Time: 1030-1130 PT Individual Time Calculation (min): 60 min   Patient has met 3 of 3 short term goals. Samuel Chavez has made significant progress toward his long-term goals since initial evaluation. Patient currently requires CGA/MinA for transfers and gait up to 58' with HDRW. Patient trialed stair mobility this morning and was able to ascend/descend a single step with B HR and Mod/MaxA- Will continue to address deficits in order to improve overall functional mobility for a safe discharge home.   Patient continues to demonstrate the following deficits muscle weakness, decreased cardiorespiratoy endurance, and decreased standing balance and decreased balance strategies and therefore will continue to benefit from skilled PT intervention to increase functional independence with mobility.  Patient progressing toward long term goals..  Continue plan of care.  PT Short Term Goals Week 1:  PT Short Term Goal 1 (Week 1): Patient will complete bed mobility with MinA PT Short Term Goal 1 - Progress (Week 1): Met PT Short Term Goal 2 (Week 1): Patient will perform stand pivot transfer with HDRW and MinA PT Short Term Goal 2 - Progress (Week 1): Met PT Short Term Goal 3 (Week 1): Patient will ambulate 72' with HDRW and MinA PT Short Term Goal 3 - Progress (Week 1): Met Week 2:  PT Short Term Goal 1 (Week 2): STG=LTG seondary to ELOS  Skilled Therapeutic Interventions/Progress Updates:  Patient greeted sitting upright in wheelchair in room with daughter and dietician present and agreeable to PT treatment session. Patient reporting 7/10 pain in his lower back where he reports he has a pinched nerve that at times radiates to L LE or B LE.     Patient wheeled to the rehab gym for time management.   Patient performed sit/stands throughout treatment session with HDRW and CGA/MinA for safety- VC for proper hand placement with improved carryover with repetition.   Patient performed stand pivot transfer to/from wheelchair and mat table with HDRW and CGA- VC for decreased cadence for improved safety.   Patient transferred to/from car simulator and wheelchair with HDRW and CGA- Once seated in car simulator, patient was able to place B LE into/out of the car. VC for proper hand placement throughout transfer.   Patient gait trained x25', x16' with HDRW and CGA- VC for decreased cadence and improved postural extension. First gait trial performed without a wheelchair follow.   Patient ascended/descended a single 6" step with B HR and MaxA x1 with second person present for safety- Patient unable to ascend >1 step secondary to global weakness/impaired endurance/activity tolerance.    Patient returned to his room sitting upright in wheelchair in room with tray table in front, call bell within reach, daughter present and all needs met.    Therapy Documentation Precautions:  Precautions Precautions: Fall Precaution Comments: PEG, recently decannulated Restrictions Weight Bearing Restrictions: No Other Position/Activity Restrictions: HOB 30 deg- trach  Therapy/Group: Individual Therapy  Samuel Chavez 04/15/2022, 7:53 AM

## 2022-04-16 LAB — GLUCOSE, CAPILLARY
Glucose-Capillary: 137 mg/dL — ABNORMAL HIGH (ref 70–99)
Glucose-Capillary: 193 mg/dL — ABNORMAL HIGH (ref 70–99)
Glucose-Capillary: 107 mg/dL — ABNORMAL HIGH (ref 70–99)
Glucose-Capillary: 146 mg/dL — ABNORMAL HIGH (ref 70–99)

## 2022-04-16 MED ORDER — NALOXEGOL OXALATE 12.5 MG PO TABS
12.5000 mg | ORAL_TABLET | Freq: Every day | ORAL | Status: DC
  Administered 2022-04-17 – 2022-04-29 (×13): 12.5 mg via ORAL
  Filled 2022-04-16 (×15): qty 1

## 2022-04-16 MED ORDER — INSULIN DETEMIR 100 UNIT/ML ~~LOC~~ SOLN
24.0000 [IU] | Freq: Two times a day (BID) | SUBCUTANEOUS | Status: DC
  Administered 2022-04-16 – 2022-04-29 (×26): 24 [IU] via SUBCUTANEOUS
  Filled 2022-04-16 (×28): qty 0.24

## 2022-04-16 NOTE — Progress Notes (Signed)
Patient ID: Samuel Chavez, male   DOB: Apr 19, 1976, 46 y.o.   MRN: [id number]  Met with pt and son who is in his room to inform team conference progress this week in therapies. He reports he is so tired but trying to stay with the program and push himself so he can go home sooner than 10/10. Discussed getitng your endurance and activity tolerance takes time but he is making progress and better than when he first came here. He needs encouragement to keep pushing. Discussed having his daughter come in closer to discharge date to do hands on care and also have his son who has been here in his room daily. Will continue to work on discharge needs and provide support. Has seen neuro-psych but may benefit from seeing him again.

## 2022-04-16 NOTE — Patient Care Conference (Signed)
Inpatient RehabilitationTeam Conference and Plan of Care Update Date: 04/16/2022   Time: 12:01 PM    Patient Name: Samuel Chavez      Medical Record Number: [id number]  Date of Birth: 06/09/1976 Sex: Male         Room/Bed: 4W17C/4W17C-01 Payor Info: Payor: [place] [employer] / Plan: [place] MEDICAID HEALTHY BLUE / Product Type: *No Product type* /    Admit Date/Time:  04/07/2022  1:35 PM  Primary Diagnosis:  Critical illness myopathy  Hospital Problems: Principal Problem:   Critical illness myopathy Active Problems:   Type 2 diabetes mellitus without complication, without long-term current use of insulin ([place])   Respiratory failure ([place])   Neuropathic pain   Reactive depression    Expected Discharge Date: Expected Discharge Date: 04/29/22  Team Members Present: Physician leading conference: Dr. Alysia Penna Social Worker Present: Ovidio Kin, LCSW Nurse Present: Dorien Chihuahua, RN PT Present: Other (comment) Jodi Mourning PT) OT Present: Laverle Hobby, OT SLP Present: Weston Anna, SLP PPS Coordinator present : Gunnar Fusi, SLP     Current Status/Progress Goal Weekly Team Focus  Bowel/Bladder   pt is continet of B/B last bowel movement was 04/15/2022  to assist pt to meet toileting needs  To assess toileting needs QS or  prn   Swallow/Nutrition/ Hydration             ADL's   CGA-min A stand pivot transfers, (S) UB ADLs, mod A LB and toileting  supervision to CGA  activity tolerance, transfers, ADls, strengthening, d/c planning   Mobility   CGA/MinA with transfers with HDRW; MinA gait up to 53' with HDRW; MaxA for 1 step with B HR  Supv/CGA  Endurance/activity tolerance, transfers, gait, dynamic stability, B LE strengthening, stair mobility   Communication             Safety/Cognition/ Behavioral Observations  mod A for higher level cognitive tasks, decreased insight into cognitive deficits.  Supervision memory, min A problem solving  complex problem  solving, recall with use of compensatory strategies   Pain   complains of chronic back pain  to keep pain less that 3 out of 10  to assess QS and PRn   Skin   has post trach wound to anterior neck and PEG tube site  To do dressing as needed to create a healing environment for wound and  to reduce infection  to assess wound  PRn     Discharge Planning:  Doing well workin gon activity tolerance and endurance, did not realize how weak he is. Will need to educate family closer to discharge.   Team Discussion: Patient with low back pain. Refusing medications for constipation; MD adjusted meds. PEG remains for ~ 3 weeks more. Progress limited by poor endurance and activity intolerance.  Patient on target to meet rehab goals: no, downgraded PT goals to min assist. Able to complete stand pivot with CGA and ambulate up to 93' with min assist. Managed steps with bilateral rails and max assist. Completes upper body care with supervision and lower body/toileting needs mod assist.    *See Care Plan and progress notes for long and short-term goals.   Revisions to Treatment Plan:  Downgraded PT goals   Teaching Needs: Safety, skin care, medications, transfers, etc  Current Barriers to Discharge: Decreased caregiver support and Home enviroment access/layout  Possible Resolutions to Barriers: Family education HH follow up services DME: W/C, HD RW     Medical Summary Current Status: pt  switched to regular diet per request. might affect cbg's. had fall over w/e without sequelae. fixated on PEG out. anxiety a problem at times  Barriers to Discharge: Behavior;Medical stability   Possible Resolutions to [employer] Focus: ongoing pt education, med regimen adjustment, PEG out after 10/6   Continued Need for Acute Rehabilitation Level of Care: The patient requires daily medical management by a physician with specialized training in physical medicine and rehabilitation for the following  reasons: Direction of a multidisciplinary physical rehabilitation program to maximize functional independence : Yes Medical management of patient stability for increased activity during participation in an intensive rehabilitation regime.: Yes Analysis of laboratory values and/or radiology reports with any subsequent need for medication adjustment and/or medical intervention. : Yes   I attest that I was present, lead the team conference, and concur with the assessment and plan of the team.   Dorien Chihuahua B 04/16/2022, 1:16 PM

## 2022-04-16 NOTE — Progress Notes (Addendum)
Speech Language Pathology Daily Session Note  Patient Details  Name: Samuel Chavez MRN: [id number] Date of Birth: [birth year]  Today's Date: 04/16/2022 SLP Individual Time: 1020-1105 SLP Individual Time Calculation (min): 45 min  Short Term Goals: Week 2: SLP Short Term Goal 1 (Week 2): Patient will demonstrate complex problem solving for functional tasks with supervision verbal and visual cues. SLP Short Term Goal 2 (Week 2): Patient will recall new, daily information with use of external aids with Min verbal and visual cues.  Skilled Therapeutic Interventions:Skilled ST services focused on cognitive skills. SLP facilitated complex problem solving and working/short term recall, pt completed simple deductive reasoning task with mod A fade to min A verbal cues and scheduling task (talent show acts) with min A fade to supervision A verbal cues. Pt requested to end session early due to chronic back pain and back to back sessions, pt missed 15 minutes of treatment. Pt was left in room with call bell within reach and chair alarm set. SLP recommends to continue skilled services.  SLP messaged MD via secure Epic chat, to notify chronic back pain impacting participation.      Pain Pain Assessment Pain Scale: 0-10 Pain Score: 8  Faces Pain Scale: Hurts even more Pain Type: Chronic pain Pain Location: Back Pain Orientation: Lower Pain Intervention(s): Rest  Therapy/Group: Individual Therapy  Haze Antillon  [facility] 04/16/2022, 11:14 AM

## 2022-04-16 NOTE — Progress Notes (Signed)
Pt continues to refuse scheduled ordered stool softeners and interventions.. Pt stated his last bowel movement was 9/25 and doesn't need interventions. Pt in bathroom c/o nausea. Pt return to chair and denies nausea. Nausea resolved. Pt educated on oxy and constipation. Pt needs reinforcement.  Sheela Stack, LPN

## 2022-04-16 NOTE — Progress Notes (Signed)
Physical Therapy Session Note  Patient Details  Name: Samuel Chavez MRN: [id number] Date of Birth: 07/23/1975  Today's Date: 04/16/2022 PT Individual Time: 0915-1015 PT Individual Time Calculation (min): 60 min   Short Term Goals: Week 1:  PT Short Term Goal 1 (Week 1): Patient will complete bed mobility with MinA PT Short Term Goal 1 - Progress (Week 1): Met PT Short Term Goal 2 (Week 1): Patient will perform stand pivot transfer with HDRW and MinA PT Short Term Goal 2 - Progress (Week 1): Met PT Short Term Goal 3 (Week 1): Patient will ambulate 39' with HDRW and MinA PT Short Term Goal 3 - Progress (Week 1): Met Week 2:  PT Short Term Goal 1 (Week 2): STG=LTG seondary to ELOS  Skilled Therapeutic Interventions/Progress Updates:  Patient greeted sitting EOB and eventually agreeable to PT treatment session, however required increased time, coaxing and encouragement. Patient reporting significant "soreness" from yesterday.   Patient performed sit/stand and stand pivot transfer with HDRW and CGA for safety. Patient wheeled ~25' with B UE and supv, however unable to continue propelling manual wheelchair secondary to decreased strength and impaired endurance.   Patient performed stand pivot transfer to/from wheelchair and mat table with HDRW and CGA- VC for decreased cadence in order to improve overall stability and safety awareness and proper hand placement.   Seated therex performed in order to improve B LE strength in order to improve functional mobility- B LAQ with 2.5#, 3 x 10 with 3 second hold B hip flexion with 2.5#, 3 x 10 B hip adduction with ball between B LE, 3 x 10 B hip abduction with green TB, 3 x 10 with 3 second hold   Patient propelled manual wheelchair >150' back to his room with B UE and supv for safety. Patient left sitting upright in wheelchair with tray table in front, call bell within reach and all needs met.    Therapy Documentation Precautions:   Precautions Precautions: Fall Precaution Comments: PEG, recently decannulated Restrictions Weight Bearing Restrictions: No Other Position/Activity Restrictions: HOB 30 deg- trach  Therapy/Group: Individual Therapy  Teniola Tseng 04/16/2022, 7:56 AM

## 2022-04-16 NOTE — Inpatient Diabetes Management (Signed)
Inpatient Diabetes Program Recommendations  AACE/ADA: New Consensus Statement on Inpatient Glycemic Control (2015)  Target Ranges:  Prepandial:   less than 140 mg/dL      Peak postprandial:   less than 180 mg/dL (1-2 hours)      Critically ill patients:  140 - 180 mg/dL   Lab Results  Component Value Date   GLUCAP 137 (H) 04/16/2022   HGBA1C 10.1 (H) 02/11/2022    Review of Glycemic Control  Diabetes history: DM 2  Current orders for Inpatient glycemic control:  Levemir 22 units bid Novolog 0-20 units tid  Ensure max tid between meals A1c 10.1%  Glucose trends at goal on regular diet. Continue to follow.    Thanks,  Tama Headings RN, MSN, BC-ADM Inpatient Diabetes Coordinator Team Pager [phone] (8a-5p)

## 2022-04-16 NOTE — Progress Notes (Signed)
PROGRESS NOTE   Subjective/Complaints: Still asking when PEG will come out. No problems otherwise overnight. Reports that PEG remains loose  Review of Systems  Constitutional:  Negative for chills and fever.  Eyes:  Negative for double vision.  Respiratory:  Negative for cough and shortness of breath.   Cardiovascular:  Negative for chest pain and palpitations.  Gastrointestinal:  Negative for abdominal pain, constipation, nausea and vomiting.  Genitourinary:  Negative for urgency.  Musculoskeletal:  Positive for back pain. Negative for joint pain.  Skin:        Dry skin  Neurological:  Positive for weakness. Negative for sensory change and speech change.  Psychiatric/Behavioral:  The patient is nervous/anxious.      Objective:   No results found. Recent Labs    04/14/22 0906  WBC 7.7  HGB 10.0*  HCT 32.1*  PLT 244    Recent Labs    04/14/22 0906 04/15/22 0535  NA 140 137  K 4.1 3.9  CL 102 103  CO2 28 27  GLUCOSE 136* 151*  BUN 7 7  CREATININE 0.54* 0.54*  CALCIUM 9.1 8.5*     Intake/Output Summary (Last 24 hours) at 04/16/2022 0820 Last data filed at 04/16/2022 0700 Gross per 24 hour  Intake 930 ml  Output 950 ml  Net -20 ml      Pressure Injury 03/08/22 Buttocks Left Stage 3 -  Full thickness tissue loss. Subcutaneous fat may be visible but bone, tendon or muscle are NOT exposed. open, red/pink and yellow; WOC updated Staging to Stage 3 03/10/22 (Active)  03/08/22 1757  Location: Buttocks  Location Orientation: Left  Staging: Stage 3 -  Full thickness tissue loss. Subcutaneous fat may be visible but bone, tendon or muscle are NOT exposed.  Wound Description (Comments): open, red/pink and yellow; WOC updated Staging to Stage 3 03/10/22  Present on Admission: No    Physical Exam: Vital Signs Blood pressure (!) 171/100, pulse 98, temperature 97.8 F (36.6 C), resp. rate 20, height 5\' 9"  (1.753  m), weight (!) 165.6 kg, SpO2 99 %.  Constitutional: No distress . Vital signs reviewed. HEENT: NCAT, EOMI, oral membranes moist Neck: supple Cardiovascular: RRR without murmur. No JVD    Respiratory/Chest: CTA Bilaterally without wheezes or rales. Normal effort    GI/Abdomen: BS +, non-tender, non-distended. PEG taped to abdomen. Bumper loose Ext: no clubbing, cyanosis, or edema Psych: pleasant and cooperative   Skin: No evidence of breakdown, no evidence of rash. Trach stoma dressed. Right buttock region obscured.. Dry skin on arms and legs.  Neurologic: Alert, Cranial nerves II through XII grossly intact, motor strength is 5/5 in bilateral deltoid, bicep, tricep, grip, 3-hip flexor, 3- knee extensors,4/5 ankle dorsiflexor and plantar flexor Sensory exam normal sensation to light touch  in bilateral upper and lower extremities   Musculoskeletal: Right knee tenderness at/around patella  Assessment/Plan: 1. Functional deficits which require 3+ hours per day of interdisciplinary therapy in a comprehensive inpatient rehab setting. Physiatrist is providing close team supervision and 24 hour management of active medical problems listed below. Physiatrist and rehab team continue to assess barriers to discharge/monitor patient progress toward functional and medical goals  Care Tool:  Bathing    Body parts bathed by patient: Right arm, Left arm, Abdomen, Front perineal area, Chest, Right upper leg, Left upper leg, Right lower leg, Face   Body parts bathed by helper: Buttocks     Bathing assist Assist Level: Moderate Assistance - Patient 50 - 74%     Upper Body Dressing/Undressing Upper body dressing   What is the patient wearing?: Hospital gown only    Upper body assist Assist Level: Moderate Assistance - Patient 50 - 74%    Lower Body Dressing/Undressing Lower body dressing      What is the patient wearing?: Pants     Lower body assist Assist for lower body dressing: Moderate  Assistance - Patient 50 - 74%     Toileting Toileting Toileting Activity did not occur (Clothing management and hygiene only): N/A (no void or bm)  Toileting assist Assist for toileting: 2 Helpers     Transfers Chair/bed transfer  Transfers assist     Chair/bed transfer assist level: Moderate Assistance - Patient 50 - 74%     Locomotion Ambulation   Ambulation assist      Assist level: Maximal Assistance - Patient 25 - 49% Assistive device:  (HDRW) Max distance: 8'   Walk 10 feet activity   Assist  Walk 10 feet activity did not occur: Safety/medical concerns (Unable to assess gait, curb, stair mobility and picking up an item from the floor at this time secondary to impaired endurance/activity tolerance and decreased global strength)        Walk 50 feet activity   Assist Walk 50 feet with 2 turns activity did not occur: Safety/medical concerns         Walk 150 feet activity   Assist Walk 150 feet activity did not occur: Safety/medical concerns         Walk 10 feet on uneven surface  activity   Assist Walk 10 feet on uneven surfaces activity did not occur: Safety/medical concerns         Wheelchair     Assist Is the patient using a wheelchair?: Yes Type of Wheelchair: Manual    Wheelchair assist level: Total Assistance - Patient < 25% Max wheelchair distance: Patient propelled manual wheelchair ~15-20' and therapist completed the remaining distance with total assist    Wheelchair 50 feet with 2 turns activity    Assist        Assist Level: Maximal Assistance - Patient 25 - 49%   Wheelchair 150 feet activity     Assist      Assist Level: Total Assistance - Patient < 25%   Blood pressure (!) 171/100, pulse 98, temperature 97.8 F (36.6 C), resp. rate 20, height 5\' 9"  (1.753 m), weight (!) 165.6 kg, SpO2 99 %.    Medical Problem List and Plan: 1. 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 Interdisciplinary team conference today to discuss goals, barriers to discharge, and dc planning.     -Grounds pass ordered 2.  Antithrombotics: -DVT/anticoagulation:  Pharmaceutical: Eliquis             -antiplatelet therapy: none 3. Pain Management: Tylenol as needed             -Neurontin  BID, Lidoderm daily; oxycodone and Flexeril prn for sciatica and neuropathy  9/20 Gabapentin 600mg  BID increased to 600mg  TID  -Continue oxycodone current dose for now, continue to monitor for effect of increased gabapentin, consider addition of duloxetine  -knee pain resolved after w/e fall    4. Mood/Behavior/Sleep: LCSW to evaluate and provide emotional support             -antipsychotic agents: n/a             -depression: continue Lexapro  -Ativan PRN anxiety--told pt he has it available to use. It's not scheduled 5. Neuropsych/cognition: This patient is capable of making decisions on his own behalf. 6. Skin/Wound Care: Routine skin care checks             -right ischial DTI             -routine trach and PEG care 7. 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              -continue Duo neb prn  -9/20 pulm consult- Decannulated, appreciate assistance  9: Heart failure with preserved EF:             Continue Coreg 6.25 mg BID             -daily weight  9/21 daily weight overall decreased from 167.8 kg on 9/18, continue to monitor Filed Weights   04/14/22 0500 04/15/22 0500 04/16/22 0617  Weight: (!) 159.3 kg (!) 163.4 kg (!) 165.6 kg   10: Left ear wax: continue Debrox  BID 11: Morbid obesity: BMI 54.64; dietary counseling 12: LLE sciatica: continue Neurontin and Flexeril 13: DM: CBGs 4 times daily             -continue Levemir 22 units BID             -SSI  CBG (last 3)  Recent Labs    04/15/22 1650 04/15/22 2058 04/16/22 0611  GLUCAP 99 165* 137*      -9/25 CBGs a litter higher this AM but overall well controlled, continue to monitor trend  9/27 pt now on regular diet per request--monitor for pattern 14: Decreased serum magnesium:   -9/26 Mag-Ox 400mg  daily started 15: Anemia:   -HGB stable at 10.0 9/25 16: RUE brachial DVT secondary to PICC: on Eliquis 17: Vitamin D deficiency: continue supplementation 18: Left knee pain: continue Voltaren gel, Lidoderm 19: Tobacco use: Cessation counseling 20.  Dysphagia improved s/p PEG 8/25, cannot remove earlier than 04/25/22, pt now eating regular diet   -this has been discussed with pt numerous times 21. Low Albumin 3.0 on 9/19, encourage protein intake  22. Constipation  -Pt reports LBM 9/25, improved continue to monitor, Mag-ox started may help with constipation  -if no bm 9/27 consider intervention 23. Tobacco use   -Start nicotine patch 24. Dry Skin  -Eucerin lotion 25. HTN  -9/26 Increased coreg to 12.5mg  BID last night, monitor for response       LOS: 9 days A FACE TO FACE EVALUATION WAS PERFORMED  Meredith Staggers 04/16/2022, 8:20 AM

## 2022-04-16 NOTE — Progress Notes (Signed)
Occupational Therapy Session Note  Patient Details  Name: Samuel Chavez MRN: [id number] Date of Birth: 17-Dec-1975  Session 1 Today's Date: 04/16/2022 OT Individual Time: 1130-1200 OT Individual Time Calculation (min): 30 min   Session 2  Today's Date: 04/16/2022 OT Individual Time: 4599-7741 OT Individual Time Calculation (min): 44 min    Short Term Goals: Week 2:  OT Short Term Goal 1 (Week 2): Pt will complete toileting tasks with min A OT Short Term Goal 2 (Week 2): Pt will complete stand pivot transfers with CGA OT Short Term Goal 3 (Week 2): Pt will complete one ADL in standing (1 min)  to demo improved activity tolerance OT Short Term Goal 4 (Week 2): Pt will complete bed mobility with no more than CGA  Skilled Therapeutic Interventions/Progress Updates:    Session 1 Pt received sitting up in the w/c with 6/10 pain in his back and reported high levels of fatigue/soreness from yesterday. Focus of session on psychosocial adjustment to CLOF and processing hospitalization. Utilized motivational interviewing techniques to discuss adjustment to recent events and process through emotions. Pt taken outside to reap benefits of fresh air and being in more natural environment. He returned inside and was left sitting up with all needs met.    Session 2 Pt received sitting up in the w/c with 7/10 pain in his back but agreeable to OT session. Discussed goals and progress so far in rehab, as well as barriers to d/c home. Pt feeling discouraged re progress but was given emotional support and motivation. He was taken to the therapy gym via w/c. He completed 44 ft of functional mobility with the RW, requiring CGA-min A overall. Heavy fatigue and extended rest break following. He was determined to meet his goal of 50 ft today. Another trial attempted- pt able to complete 51 ft!! CGA increasing to min/mod for final 10 ft. Activity completed to increase functional activity tolerance and ability to  access home environment. Pt returned to his room and was left sitting up with all needs met.    Therapy Documentation Precautions:  Precautions Precautions: Fall Precaution Comments: PEG, recently decannulated Restrictions Weight Bearing Restrictions: No Other Position/Activity Restrictions: HOB 30 deg- trach  Therapy/Group: Individual Therapy  Curtis Sites 04/16/2022, 6:47 AM

## 2022-04-17 LAB — GLUCOSE, CAPILLARY
Glucose-Capillary: 146 mg/dL — ABNORMAL HIGH (ref 70–99)
Glucose-Capillary: 141 mg/dL — ABNORMAL HIGH (ref 70–99)
Glucose-Capillary: 145 mg/dL — ABNORMAL HIGH (ref 70–99)
Glucose-Capillary: 167 mg/dL — ABNORMAL HIGH (ref 70–99)

## 2022-04-17 MED ORDER — CARVEDILOL 6.25 MG PO TABS
18.7500 mg | ORAL_TABLET | Freq: Two times a day (BID) | ORAL | Status: DC
  Administered 2022-04-17 – 2022-04-24 (×14): 18.75 mg via ORAL
  Filled 2022-04-17 (×14): qty 1

## 2022-04-17 NOTE — Progress Notes (Signed)
Occupational Therapy Session Note  Patient Details  Name: Samuel Chavez MRN: [id number] Date of Birth: 1975-10-09  Today's Date: 04/17/2022 OT Individual Time: 1000-1112 OT Individual Time Calculation (min): 72 min    Short Term Goals: Week 2:  OT Short Term Goal 1 (Week 2): Pt will complete toileting tasks with min A OT Short Term Goal 2 (Week 2): Pt will complete stand pivot transfers with CGA OT Short Term Goal 3 (Week 2): Pt will complete one ADL in standing (1 min)  to demo improved activity tolerance OT Short Term Goal 4 (Week 2): Pt will complete bed mobility with no more than CGA  Skilled Therapeutic Interventions/Progress Updates:    Pt received in the w/c with 7/10 pain in his back and "all over" but agreeable to OT session and request to go outside for pain management. Once outside he completed 3 trials of standing with functional reaching with a 3lb dumbbell. Graded activity up with no UE support on the RW. He lasted about 30 seconds each trial before fatiguing. Extended rest breaks between each set. He then completed 3x5 overhead reach with BUE on a 3 lb dumbbell. Pt overall demonstrates significant proximal weakness. Pt with questions re recovery and length of return to IADLs and functional mobility. He then completed 1x8 high knee marches in standing with BUE support. He was able to lift each foot about 4-5 in from the ground. Activity completed to increase independence and safety with threshold management. Pt returned inside. He transferred to the NuStep with CGA using the RW and completed 8 min on level 6 to increase functional activity tolerance. Pt was passed off to PT [place] on NuStep.   Therapy Documentation Precautions:  Precautions Precautions: Fall Precaution Comments: PEG, recently decannulated Restrictions Weight Bearing Restrictions: No Other Position/Activity Restrictions: HOB 30 deg- trach  Therapy/Group: Individual Therapy  Curtis Sites 04/17/2022, 6:39  AM

## 2022-04-17 NOTE — Progress Notes (Signed)
Speech Language Pathology Daily Session Note  Patient Details  Name: Samuel Chavez MRN: [id number] Date of Birth: January 24, 1976  Today's Date: 04/17/2022 SLP Individual Time: 1445-1530 SLP Individual Time Calculation (min): 45 min  Short Term Goals: Week 2: SLP Short Term Goal 1 (Week 2): Patient will demonstrate complex problem solving for functional tasks with supervision verbal and visual cues. SLP Short Term Goal 2 (Week 2): Patient will recall new, daily information with use of external aids with Min verbal and visual cues.  Skilled Therapeutic Interventions: Skilled ST treatment focused on cognitive goals. Pt greeted upright in wheelchair on arrival and accompanied by his son. Pt agreeable to ST treatment outdoors in courtyard area and stated he thoroughly enjoyed the warmth and opportunity for fresh air. SLP facilitated session by providing overall min A verbal and visual cues for a complex problem solving, deductive reasoning, and organization task in which the patient prioritized information in order to generate a daily schedule when given a list of appointments. Patient required min A fading to sup A verbal cues to identify and repair errors and benefited from additional processing time to understand concepts. Efficiency and self monitoring skills improved as task progressed. Pt reported task as being functional because "I do stuff like this everyday", referring to making a schedule, to-do lists, etc. Patient was returned to room and left in wheelchair with alarm activated and immediate needs within reach at end of session. Continue per current plan of care.       Pain  None/denied  Therapy/Group: Individual Therapy  Patty Sermons 04/17/2022, 4:42 PM

## 2022-04-17 NOTE — Progress Notes (Signed)
Physical Therapy Session Note  Patient Details  Name: Samuel Chavez MRN: [id number] Date of Birth: 12-Jan-1976  Today's Date: 04/17/2022 PT Individual Time: 1112-1152 PT Individual Time Calculation (min): 40 min   Short Term Goals: Week 2:  PT Short Term Goal 1 (Week 2): STG=LTG seondary to ELOS  Skilled Therapeutic Interventions/Progress Updates:    Pt received on nustep as hand off from OT. Pt was initially resistant to unscheduled makeup therapy, but was agreeable with encouragement. Pt completed 2 x 5 min at level 6 for global strength and endurance. Pt required extended rest break d/t fatigue from back to back sessions. Pt reports extremely high radicular pain in his R LB and thigh, premedicated. Rest and positioning provided as needed. Pt participated in self directed stretching for pain management. Pt then performed alternating OH press and chest press 3 x 10 with 4 lb med ball for UE strength and endurance. Pt then propelled w/c back to room with one rest break d/t fatigue for endurance and functional mobility. Pt remained seated in w/c at end of session and was left with all needs in reach.   Therapy Documentation Precautions:  Precautions Precautions: Fall Precaution Comments: PEG, recently decannulated Restrictions Weight Bearing Restrictions: No Other Position/Activity Restrictions: HOB 30 deg- trach General:       Therapy/Group: Individual Therapy  Mickel Fuchs 04/17/2022, 11:21 AM

## 2022-04-17 NOTE — Progress Notes (Signed)
Physical Therapy Session Note  Patient Details  Name: Samuel Chavez MRN: [id number] Date of Birth: 11-Aug-1975  Today's Date: 04/17/2022 PT Individual Time: 1st Treatment Session: 0830-0900; 2nd Treatment Session: 1300-1400 PT Individual Time Calculation (min): 30 min; 60  Short Term Goals: Week 1:  PT Short Term Goal 1 (Week 1): Patient will complete bed mobility with MinA PT Short Term Goal 1 - Progress (Week 1): Met PT Short Term Goal 2 (Week 1): Patient will perform stand pivot transfer with HDRW and MinA PT Short Term Goal 2 - Progress (Week 1): Met PT Short Term Goal 3 (Week 1): Patient will ambulate 85' with HDRW and MinA PT Short Term Goal 3 - Progress (Week 1): Met Week 2:  PT Short Term Goal 1 (Week 2): STG=LTG seondary to ELOS  Skilled Therapeutic Interventions/Progress Updates:  1st Treatment Session- Patient greeted sitting upright in wheelchair in room and eventually agreeable to PT treatment session with increased time and coaxing.  Patient requesting to use the urinal prior to treatment session- Patient was able to use the urinal independently.   Patient wheeled to rehab gym for time management.   Patient gait trained 2 x 22' with [place] for decreased cadence, breathing with movement and improved postural extension. No wheelchair follow during gait trials.     Patient returned to his room sitting upright in wheelchair with call bell within reach and all needs met.    2nd Treatment Session- Patient greeted sitting on toilet in room and agreeable to PT treatment session. Patient requesting to transfer back to the wheelchair by himself secondary to not wanting therapist in the bathroom with him- Patient thoroughly educated on need for someone to be with him in order to ensure safety. Patient agreeable to having son present while therapist provided education of proper hand placement, RW management, etc. Patient performed sit/stand with HDRW and CGA- While standing son  pulled up patient's briefs and then he was able to perform a stand pivot transfer to the wheelchair with CGA. Patient wheeled to rehab gym for time management.   Patient performed stand pivot transfer to/from wheelchair and mat table with HDRW and CGA- VC for decreased cadence and proper hand placement. Patient visibly frustrated with poor frustration tolerance, not making eye contact with therapist and quiet with flat affect. Therapist and patient spent a significant amount of time discussing patient's mental state and frustration with himself. Therapist discussed CLOF and expected LOF upon discharge, as well as in the future after continued rehab. Patient given words of affirmation to repeat with focus being on giving himself grace and realizing how far he has come physically since being in the hospital. Patient responded well to conversation and was able to repeat all 6 affirmations therapist provided. Patient was then able to transfer back to his wheelchair with HDRW and CGA. Patient then performed x5 sit/stands with 10 second stand and good effort noted- Patient unable to perform >5 secondary to poor endurance/activity tolerance. Patient wheeled back to his room for time management.   Patient returned to his room sitting upright in wheelchair with call bell within reach and all needs met.    Therapy Documentation Precautions:  Precautions Precautions: Fall Precaution Comments: PEG, recently decannulated Restrictions Weight Bearing Restrictions: No Other Position/Activity Restrictions: HOB 30 deg- trach  Therapy/Group: Individual Therapy  Essance Gatti 04/17/2022, 8:22 AM

## 2022-04-17 NOTE — Progress Notes (Signed)
Pt continues to refuse bed alarm. Educated pt on his recent fall and safety precautions. Pt continues to refuse safety interventions. Reminded pt that staff is to assist him with transfers in the room. Pt verbalized understanding.  .me

## 2022-04-17 NOTE — Progress Notes (Signed)
PROGRESS NOTE   Subjective/Complaints: Pt up in day room. Working on ambulation. Says anxiety is better as lon as he's using ativan. Feels anxious in bed, room, not being able to get up on his own. Had some anxiety at home before, but it was never like this.   Review of Systems  Constitutional:  Negative for chills and fever.  Eyes:  Negative for double vision.  Respiratory:  Negative for cough and shortness of breath.   Cardiovascular:  Negative for chest pain and palpitations.  Gastrointestinal:  Negative for abdominal pain, constipation, nausea and vomiting.  Genitourinary:  Negative for urgency.  Musculoskeletal:  Positive for back pain. Negative for joint pain.  Skin:        Dry skin  Neurological:  Positive for weakness. Negative for sensory change and speech change.  Psychiatric/Behavioral:  The patient is nervous/anxious.      Objective:   No results found. No results for input(s): "WBC", "HGB", "HCT", "PLT" in the last 72 hours.  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 Subcutaneous fat may be visible but bone, tendon or muscle are NOT exposed. open, red/pink and yellow; WOC updated Staging to Stage 3 03/10/22, changed to stage 2 on 04/07/22; ad (Active)  03/08/22 1757  Location: Buttocks  Location Orientation: Left  Staging: Stage 3 -  Full thickness tissue loss. Subcutaneous fat may be visible but bone, tendon or muscle are NOT exposed.  Wound Description (Comments): open, red/pink and yellow; WOC updated Staging to Stage 3 03/10/22, changed to stage 2 on 04/07/22; admit to rehab  Present on  Admission: Yes    Physical Exam: Vital Signs Blood pressure (!) 173/101, pulse 95, temperature 98.4 F (36.9 C), temperature source Oral, resp. rate 20, height 5\' 9"  (1.753 m), weight (!) 163.5 kg, SpO2 90 %.  Constitutional: No distress . Vital signs reviewed. HEENT: NCAT, EOMI, oral membranes moist Neck: supple Cardiovascular: RRR without murmur. No JVD    Respiratory/Chest: CTA Bilaterally without wheezes or rales. Normal effort    GI/Abdomen: BS +, non-tender, non-distended Ext: no clubbing, cyanosis, or edema Psych: pleasant and cooperative    Skin: No evidence of breakdown, no evidence of rash. Trach stoma dressed. Right buttock region not visualized.. Dry skin on arms and legs.  Neurologic: Alert, Cranial nerves II through XII grossly intact, motor strength is 5/5 in bilateral deltoid, bicep, tricep, grip, 3-hip flexor, 3- knee extensors,4/5 ankle dorsiflexor and plantar flexor Sensory exam normal sensation to light touch  in bilateral upper and lower extremities   Musculoskeletal: Right knee tenderness at/around patella  Assessment/Plan: 1. Functional deficits which require 3+ hours per day of interdisciplinary therapy in a comprehensive inpatient rehab setting. Physiatrist is providing close team supervision and 24  hour management of active medical problems listed below. Physiatrist and rehab team continue to assess barriers to discharge/monitor patient progress toward functional and medical goals  Care Tool:  Bathing    Body parts bathed by patient: Right arm, Left arm, Abdomen, Front perineal area, Chest, Right upper leg, Left upper leg, Right lower leg, Face   Body parts bathed by helper: Buttocks     Bathing assist Assist Level: Moderate Assistance - Patient 50 - 74%     Upper Body Dressing/Undressing Upper body dressing   What is the patient wearing?: Hospital gown only    Upper body assist Assist Level: Moderate Assistance - Patient 50 - 74%    Lower Body  Dressing/Undressing Lower body dressing      What is the patient wearing?: Pants     Lower body assist Assist for lower body dressing: Moderate Assistance - Patient 50 - 74%     Toileting Toileting Toileting Activity did not occur (Clothing management and hygiene only): N/A (no void or bm)  Toileting assist Assist for toileting: 2 Helpers     Transfers Chair/bed transfer  Transfers assist     Chair/bed transfer assist level: Moderate Assistance - Patient 50 - 74%     Locomotion Ambulation   Ambulation assist      Assist level: Maximal Assistance - Patient 25 - 49% Assistive device:  (HDRW) Max distance: 8'   Walk 10 feet activity   Assist  Walk 10 feet activity did not occur: Safety/medical concerns (Unable to assess gait, curb, stair mobility and picking up an item from the floor at this time secondary to impaired endurance/activity tolerance and decreased global strength)        Walk 50 feet activity   Assist Walk 50 feet with 2 turns activity did not occur: Safety/medical concerns         Walk 150 feet activity   Assist Walk 150 feet activity did not occur: Safety/medical concerns         Walk 10 feet on uneven surface  activity   Assist Walk 10 feet on uneven surfaces activity did not occur: Safety/medical concerns         Wheelchair     Assist Is the patient using a wheelchair?: Yes Type of Wheelchair: Manual    Wheelchair assist level: Total Assistance - Patient < 25% Max wheelchair distance: Patient propelled manual wheelchair ~15-20' and therapist completed the remaining distance with total assist    Wheelchair 50 feet with 2 turns activity    Assist        Assist Level: Maximal Assistance - Patient 25 - 49%   Wheelchair 150 feet activity     Assist      Assist Level: Total Assistance - Patient < 25%   Blood pressure (!) 173/101, pulse 95, temperature 98.4 F (36.9 C), temperature source Oral, resp.  rate 20, height 5\' 9"  (1.753 m), weight (!) 163.5 kg, SpO2 90 %.    Medical Problem List and Plan: 1. 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  Antithrombotics: -DVT/anticoagulation:  Pharmaceutical: Eliquis             -antiplatelet therapy: none 3. Pain Management: Tylenol as needed             -  Neurontin BID, Lidoderm daily; oxycodone and Flexeril prn for sciatica and neuropathy  9/20 Gabapentin 600mg  BID increased to 600mg  TID  -Continue oxycodone current dose for now, continue to monitor for effect of increased gabapentin, consider addition of duloxetine  -knee pain resolved after w/e fall    4. Mood/Behavior/Sleep: LCSW to evaluate and provide emotional support             -antipsychotic agents: n/a             -depression: continue Lexapro  9/28 -Ativan PRN anxiety--we discussed a scheduled option for his anxiety control beyond lexapro and ativan. I told him that we don't intend on sending him home on much more than qd to bid prn ativan.  5. Neuropsych/cognition: This patient is capable of making decisions on his own behalf. 6. Skin/Wound Care: Routine skin care checks             -right ischial DTI             -routine trach and PEG care 7. 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              -continue Duo neb prn  -9/20 pulm consult- Decannulated, appreciate assistance  9: Heart failure with preserved EF:             Continue Coreg 6.25 mg BID             -daily weight  9/21 daily weight overall decreased from  167.8 kg on 9/18, continue to monitor Filed Weights   04/15/22 0500 04/16/22 0617 04/17/22 0450  Weight: (!) 163.4 kg (!) 165.6 kg (!) 163.5 kg   10: Left ear wax: continue Debrox BID 11: Morbid obesity: BMI 54.64; dietary counseling 12: LLE sciatica: continue Neurontin and Flexeril 13: DM: CBGs 4 times daily             -continue Levemir 22 units BID             -SSI  CBG (last 3)  Recent Labs    04/16/22 1627 04/16/22 2025 04/17/22 0625  GLUCAP 107* 146* 146*      -9/25 CBGs a litter higher this AM but overall well controlled, continue to monitor trend  9/28 pt now on regular diet per request--sugars reasonable 14: Decreased serum magnesium:   -9/26 Mag-Ox 400mg  daily started 15: Anemia:   -HGB stable at 10.0 9/25 16: RUE brachial DVT secondary to PICC: on Eliquis 17: Vitamin D deficiency: continue supplementation 18: Left knee pain: continue Voltaren gel, Lidoderm 19: Tobacco use: Cessation counseling 20.  Dysphagia improved s/p PEG 8/25, cannot remove earlier than 04/25/22, pt now eating regular diet   -this has been discussed with pt numerous times 21. Low Albumin 3.0 on 9/19, encourage protein intake  22. Constipation  -Pt reports LBM 9/25, none recorded since 9/22  -give 60cc sorbitol today 9/28 23. Tobacco use   -Start ednicotine patch 24. Dry Skin  -Eucerin lotion 25. HTN  -9/26 Increased coreg to 12.5mg  BID last night, monitor for response    -9/28 remains elevated, increase coreg to 18.75mg  bid  LOS: 10 days A FACE TO FACE EVALUATION WAS PERFORMED  Meredith Staggers 04/17/2022, 10:54 AM

## 2022-04-18 DIAGNOSIS — G44209 Tension-type headache, unspecified, not intractable: Secondary | ICD-10-CM

## 2022-04-18 DIAGNOSIS — I10 Essential (primary) hypertension: Secondary | ICD-10-CM

## 2022-04-18 LAB — GLUCOSE, CAPILLARY
Glucose-Capillary: 158 mg/dL — ABNORMAL HIGH (ref 70–99)
Glucose-Capillary: 117 mg/dL — ABNORMAL HIGH (ref 70–99)
Glucose-Capillary: 105 mg/dL — ABNORMAL HIGH (ref 70–99)

## 2022-04-18 MED ORDER — LISINOPRIL 5 MG PO TABS: 5.0000 mg | ORAL_TABLET | Freq: Every day | ORAL | Status: DC

## 2022-04-18 MED ORDER — HYDRALAZINE HCL 10 MG PO TABS
10.0000 mg | ORAL_TABLET | Freq: Three times a day (TID) | ORAL | Status: DC | PRN
  Administered 2022-04-21 – 2022-04-28 (×5): 10 mg via ORAL
  Filled 2022-04-18 (×5): qty 1

## 2022-04-18 MED ORDER — LISINOPRIL 10 MG PO TABS
10.0000 mg | ORAL_TABLET | Freq: Every day | ORAL | Status: DC
  Administered 2022-04-18 – 2022-04-21 (×4): 10 mg via ORAL
  Filled 2022-04-18 (×4): qty 1

## 2022-04-18 NOTE — Progress Notes (Signed)
PROGRESS NOTE   Subjective/Complaints: He developed HA today, has not had tylenol yet. Continues to have increased anxiety.   Review of Systems  Constitutional:  Negative for chills and fever.  Eyes:  Negative for double vision.  Respiratory:  Negative for cough and shortness of breath.   Cardiovascular:  Negative for chest pain and palpitations.  Gastrointestinal:  Negative for abdominal pain, constipation, nausea and vomiting.  Genitourinary:  Negative for urgency.  Musculoskeletal:  Positive for back pain. Negative for joint pain.  Skin:        Dry skin  Neurological:  Positive for weakness and headaches. Negative for sensory change and speech change.  Psychiatric/Behavioral:  The patient is nervous/anxious.      Objective:   No results found. No results for input(s): "WBC", "HGB", "HCT", "PLT" in the last 72 hours.  No results for input(s): "NA", "K", "CL", "CO2", "GLUCOSE", "BUN", "CREATININE", "CALCIUM" in the last 72 hours.   Intake/Output Summary (Last 24 hours) at 04/18/2022 1529 Last data filed at 04/18/2022 1100 Gross per 24 hour  Intake --  Output 3650 ml  Net -3650 ml      Pressure Injury 03/08/22 Buttocks Left Stage 3 -  Full thickness tissue loss. Subcutaneous fat may be visible but bone, tendon or muscle are NOT exposed. open, red/pink and yellow; WOC updated Staging to Stage 3 03/10/22, changed to stage 2 on 04/07/22; ad (Active)  03/08/22 1757  Location: Buttocks  Location Orientation: Left  Staging: Stage 3 -  Full thickness tissue loss. Subcutaneous fat may be visible but bone, tendon or muscle are NOT exposed.  Wound Description (Comments): open, red/pink and yellow; WOC updated Staging to Stage 3 03/10/22, changed to stage 2 on 04/07/22; admit to rehab  Present on Admission: Yes    Physical Exam: Vital Signs Blood pressure (!) 158/96, pulse 89, temperature 97.8 F (36.6 C), resp. rate 18, height  5\' 9"  (1.753 m), weight (!) 164 kg, SpO2 95 %.  Constitutional: No distress . Appears mildly uncomfortable due to headache. Vital signs reviewed. HEENT: NCAT, EOMI, oral membranes moist Neck: supple Cardiovascular: RRR without murmur. No JVD    Respiratory/Chest: CTA Bilaterally without wheezes or rales. Normal effort    GI/Abdomen: BS +, non-tender, non-distended Ext: no clubbing, cyanosis, or edema Psych: pleasant and cooperative    Skin: No evidence of breakdown, no evidence of rash. Trach stoma dressed. Right buttock region not visualized.. Dry skin on arms and legs.  Neurologic: Alert, Cranial nerves II through XII grossly intact, motor strength is 5/5 in bilateral deltoid, bicep, tricep, grip, 3-hip flexor, 3- knee extensors,4/5 ankle dorsiflexor and plantar flexor Sensory exam normal sensation to light touch  in bilateral upper and lower extremities   Musculoskeletal: Right knee tenderness at/around patella  Assessment/Plan: 1. Functional deficits which require 3+ hours per day of interdisciplinary therapy in a comprehensive inpatient rehab setting. Physiatrist is providing close team supervision and 24 hour management of active medical problems listed below. Physiatrist and rehab team continue to assess barriers to discharge/monitor patient progress toward functional and medical goals  Care Tool:  Bathing    Body parts bathed by patient: Right arm, Left arm,  Abdomen, Front perineal area, Chest, Right upper leg, Left upper leg, Right lower leg, Face   Body parts bathed by helper: Buttocks     Bathing assist Assist Level: Moderate Assistance - Patient 50 - 74%     Upper Body Dressing/Undressing Upper body dressing   What is the patient wearing?: Hospital gown only    Upper body assist Assist Level: Moderate Assistance - Patient 50 - 74%    Lower Body Dressing/Undressing Lower body dressing      What is the patient wearing?: Pants     Lower body assist Assist for  lower body dressing: Moderate Assistance - Patient 50 - 74%     Toileting Toileting Toileting Activity did not occur (Clothing management and hygiene only): N/A (no void or bm)  Toileting assist Assist for toileting: 2 Helpers     Transfers Chair/bed transfer  Transfers assist     Chair/bed transfer assist level: Moderate Assistance - Patient 50 - 74%     Locomotion Ambulation   Ambulation assist      Assist level: Maximal Assistance - Patient 25 - 49% Assistive device:  (HDRW) Max distance: 8'   Walk 10 feet activity   Assist  Walk 10 feet activity did not occur: Safety/medical concerns (Unable to assess gait, curb, stair mobility and picking up an item from the floor at this time secondary to impaired endurance/activity tolerance and decreased global strength)        Walk 50 feet activity   Assist Walk 50 feet with 2 turns activity did not occur: Safety/medical concerns         Walk 150 feet activity   Assist Walk 150 feet activity did not occur: Safety/medical concerns         Walk 10 feet on uneven surface  activity   Assist Walk 10 feet on uneven surfaces activity did not occur: Safety/medical concerns         Wheelchair     Assist Is the patient using a wheelchair?: Yes Type of Wheelchair: Manual    Wheelchair assist level: Total Assistance - Patient < 25% Max wheelchair distance: Patient propelled manual wheelchair ~15-20' and therapist completed the remaining distance with total assist    Wheelchair 50 feet with 2 turns activity    Assist        Assist Level: Maximal Assistance - Patient 25 - 49%   Wheelchair 150 feet activity     Assist      Assist Level: Total Assistance - Patient < 25%   Blood pressure (!) 158/96, pulse 89, temperature 97.8 F (36.6 C), resp. rate 18, height 5\' 9"  (1.753 m), weight (!) 164 kg, SpO2 95 %.    Medical Problem List and Plan: 1. Functional deficits secondary to critical  illness myopathy and respiratory failure             -patient may not shower             -ELOS/Goals: 10/10, sup to CGA  -Continue CIR therapies including PT, OT  -Grounds pass ordered 2.  Antithrombotics: -DVT/anticoagulation:  Pharmaceutical: Eliquis             -antiplatelet therapy: none 3. Pain Management: Tylenol as needed             -Neurontin BID, Lidoderm daily; oxycodone and Flexeril prn for sciatica and neuropathy  9/20 Gabapentin 600mg  BID increased to 600mg  TID  -Continue oxycodone current dose for now, continue to monitor for effect of increased  gabapentin, consider addition of duloxetine  -knee pain resolved after w/e fall    4. Mood/Behavior/Sleep: LCSW to evaluate and provide emotional support             -antipsychotic agents: n/a             -depression: continue Lexapro  9/28 -Ativan PRN anxiety--we discussed a scheduled option for his anxiety control beyond lexapro and ativan. I told him that we don't intend on sending him home on much more than qd to bid prn ativan.  5. Neuropsych/cognition: This patient is capable of making decisions on his own behalf. 6. Skin/Wound Care: Routine skin care checks             -right ischial DTI             -routine trach and PEG care 7. 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              -continue Duo neb prn  -9/20 pulm consult- Decannulated, appreciate assistance  9: Heart failure with preserved EF:             Continue Coreg 6.25 mg BID             -daily weight  9/21 daily weight overall decreased from 167.8 kg on 9/18, continue to monitor Filed Weights   04/16/22 0617 04/17/22 0450 04/18/22 0500  Weight: (!) 165.6 kg (!) 163.5 kg (!) 164 kg    10: Left ear wax: continue Debrox BID 11: Morbid obesity: BMI 54.64; dietary counseling 12: LLE sciatica: continue Neurontin and Flexeril 13: DM: CBGs 4 times daily             -continue Levemir 22 units BID             -SSI  CBG (last 3)  Recent Labs    04/17/22 2056 04/18/22 0544 04/18/22 1135  GLUCAP 167* 158* 117*      -9/25 CBGs a litter higher this AM but overall well controlled, continue to monitor trend  9/28 pt now on regular diet per request--sugars reasonable 14: Decreased serum magnesium:   -9/26 Mag-Ox 400mg  daily started  -recheck monday 15: Anemia:   -HGB stable at 10.0 9/25 16: RUE brachial DVT secondary to PICC: on Eliquis 17: Vitamin D deficiency: continue supplementation 18: Left knee pain: continue Voltaren gel, Lidoderm 19: Tobacco use: Cessation counseling 20.  Dysphagia improved s/p PEG 8/25, cannot remove earlier than 04/25/22, pt now eating regular diet   -this has been discussed with pt numerous times 21. Low Albumin 3.0 on 9/19, encourage protein intake  22. Constipation  -Pt reports LBM 9/25, none recorded since 9/22  -give 60cc sorbitol today 9/28  -9/29 Reports multiple BM today 23. Tobacco use   -Start ednicotine patch 24. Dry Skin  -Eucerin lotion 25. HTN  -9/26 Increased coreg to 12.5mg  BID last night, monitor for response    -9/28 remains elevated, increase coreg to 18.75mg  bid   -9/29 Start lisinopril 10mg  daily 26. HA   -9/29 Tylenol was given, his HA improved    LOS: 11 days A FACE TO FACE  EVALUATION WAS PERFORMED  Jennye Boroughs 04/18/2022, 3:29 PM

## 2022-04-18 NOTE — Progress Notes (Signed)
Physical Therapy Session Note  Patient Details  Name: Samuel Chavez MRN: [id number] Date of Birth: 03-Mar-1976  Today's Date: 04/18/2022 PT Individual Time: 1300-1415 PT Individual Time Calculation (min): 75 min   Short Term Goals: Week 1:  PT Short Term Goal 1 (Week 1): Patient will complete bed mobility with MinA PT Short Term Goal 1 - Progress (Week 1): Met PT Short Term Goal 2 (Week 1): Patient will perform stand pivot transfer with HDRW and MinA PT Short Term Goal 2 - Progress (Week 1): Met PT Short Term Goal 3 (Week 1): Patient will ambulate 56' with HDRW and MinA PT Short Term Goal 3 - Progress (Week 1): Met Week 2:  PT Short Term Goal 1 (Week 2): STG=LTG seondary to ELOS  Skilled Therapeutic Interventions/Progress Updates:  Patient greeted semi-reclined in bed with son present- Patient in and out of sleep while therapist present. Vitals taken while in the bed- BP: 158/114; HR: 84-90s; SpO2: 95% and higher. Spoke with RN, Faby, reporting patient's BP is normally elevated. Patient declined OOB activity, as well as bed level exercises. Patient did request to use the restroom- Patient transitioned from semi-reclined to sitting EOB with the use of the bed rail and Supv. Patient transitioned from sit/stand with HDRW and CGA. Patient then performed stand pivot transfer with HDRW from EOB to wheelchair with CGA- Good recall of proper hand placement prior to sitting. Patient wheeled to the restroom where he then performed stand pivot transfer to the toilet with HDRW and CGA- While standing without UE support, patient was able to doff briefs with CGA for safety. Patient demonstrated good stability while sitting on the toilet with no LOB noted. Patient was able to perform pericare independently. Patient performed sit/stand with HDRW and grab bar with SBA- While standing, therapist provided MinA for puling up boxer briefs. Patient then performed stand pivot with HDRW and CGA for safety. Patient sat  in his wheelchair at the sink while performing hand hygiene.   Patient agreeable to going to the gym for "exercise." Patient transferred to/from wheelchair and Nustep with HDRW and CGA/SBA for safety. Patient performed Nustep x5 minutes on level 4. Patient required extended seated rest break post Nustep secondary to SOB and fatigue after exercise. Patient wheeled back to his room for time management and left sitting upright in wheelchair with call bell within reach and all needs met.   Therapy Documentation Precautions:  Precautions Precautions: Fall Precaution Comments: PEG, recently decannulated Restrictions Weight Bearing Restrictions: No Other Position/Activity Restrictions: HOB 30 deg- trach  Therapy/Group: Individual Therapy  Samuel Chavez 04/18/2022, 7:59 AM

## 2022-04-18 NOTE — Progress Notes (Signed)
Physical Therapy Session Note  Patient Details  Name: Samuel Chavez MRN: [id number] Date of Birth: 01/07/1976  Today's Date: 04/18/2022 PT Individual Time: 1031-1055 PT Individual Time Calculation (min): 24 min  and Today's Date: 04/18/2022 PT Missed Time: 21 Minutes Missed Time Reason: Patient ill (Comment);Pain (High BP reading with 7/10 headache pain)  Short Term Goals: Week 1:  PT Short Term Goal 1 (Week 1): Patient will complete bed mobility with MinA PT Short Term Goal 1 - Progress (Week 1): Met PT Short Term Goal 2 (Week 1): Patient will perform stand pivot transfer with HDRW and MinA PT Short Term Goal 2 - Progress (Week 1): Met PT Short Term Goal 3 (Week 1): Patient will ambulate 60' with HDRW and MinA PT Short Term Goal 3 - Progress (Week 1): Met Week 2:  PT Short Term Goal 1 (Week 2): STG=LTG seondary to ELOS  Skilled Therapeutic Interventions/Progress Updates:  Patient supine in bed on entrance to room. Son present in room. Patient alert but not readily agreeable to PT session.   Patient with 7/10 headache pain complaint at start of session.  Pt has had a high BP reading earlier in morning and MD and PA looking into additional medication to provide to continue to manage BP. BP taken in supine with MD present with reading of 166/105 (MAP 125) and pulse 81 bpm. Pt is very concerned with BP and headache pain. Educated pt that medical team is "chasing" his change in BP and trying to manipulate medications to best treat his symptoms. Pt relates that he has been up since 4am but is scared to go to sleep b/c he does not want to "slip into a coma". Educated pt that a coma is not something that the team is worried about at this point as his health is being monitored well. His headache is most likely a symptom of his high BP and he may benefit from getting sleep.   Related to MD that pt is anxious re: falling asleep and returning to comatose state. Encouraged rest at this point to  address high BP. MD to continue to look into meds and attempt to slowly bring BP down.   Patient remains supine at end of session with brakes locked, bed alarm set, and all needs within reach.  Pt missed 21 min of skilled therapy due to high BP and headache pain with fatigue. Will re-attempt as schedule and pt availability permits.   Therapy Documentation Precautions:  Precautions Precautions: Fall Precaution Comments: PEG, recently decannulated Restrictions Weight Bearing Restrictions: No Other Position/Activity Restrictions: HOB 30 deg- trach General: PT Amount of Missed Time (min): 21 Minutes PT Missed Treatment Reason: Patient ill (Comment);Pain (High BP reading with 7/10 headache pain) Vital Signs: Therapy Vitals Temp: 97.8 F (36.6 C) Pulse Rate: 89 Resp: 18 BP: (!) 158/96 Patient Position (if appropriate): Sitting Oxygen Therapy SpO2: 95 % O2 Device: Room Air Pain:  Relates 7/10 headache pain. Premedicated with pain medicine from RN. RN also providing Tylenol during session.    Therapy/Group: Individual Therapy  Alger Simons PT, DPT, CSRS 04/18/2022, 5:28 PM

## 2022-04-18 NOTE — Progress Notes (Signed)
Occupational Therapy Session Note  Patient Details  Name: Samuel Chavez MRN: [id number] Date of Birth: 09/06/1975  Today's Date: 04/18/2022 OT Individual Time: 1194-1740 OT Individual Time Calculation (min): 40 min  and Today's Date: 04/18/2022 OT Missed Time: 35 Minutes Missed Time Reason: Other (comment) (BP elevated and pt w/fatigue and headache)   Short Term Goals: Week 1:  OT Short Term Goal 1 (Week 1): Pt will don LB clothing with min A OT Short Term Goal 1 - Progress (Week 1): Met OT Short Term Goal 2 (Week 1): Pt will complete sit > stand with min A OT Short Term Goal 2 - Progress (Week 1): Met OT Short Term Goal 3 (Week 1): Pt will complete a stand pivot transfer with max A of 1 OT Short Term Goal 3 - Progress (Week 1): Met OT Short Term Goal 4 (Week 1): Pt will tolerate one minute of standing with no desaturation to demo improved activity tolerance OT Short Term Goal 4 - Progress (Week 1): Met  Skilled Therapeutic Interventions/Progress Updates:    Pt received transferring w/o assist as OT entered room, OT stepping in to assist for safety with transfer with min A using RW. OT took BP in w/c and it was reading 181/97 HR 92 and SpO2 93%. RN present and notified PA. PA stating to continue if pt asymptomatic, however pt visibly fatigued and reporting headache. With education and encouragement, pt agreeable to transfer back to bed completing with min A using RW. Pt required +2 assist to position in bed. In supine, BP 189/99 and HR 88. OT educated on signs of heart attack, safety with asking for assistance prior to getting OOB, etc. Pt left supine with HOB elevated and all needs in reach. Family member also present.   Pt missed 35 min due to elevated BP with headache and fatigue.    Therapy Documentation Precautions:  Precautions Precautions: Fall Precaution Comments: PEG, recently decannulated Restrictions Weight Bearing Restrictions: No Other Position/Activity Restrictions:  HOB 30 deg- trach General: General OT Amount of Missed Time: 35 Minutes Vital Signs: Therapy Vitals Temp: 98.2 F (36.8 C) Temp Source: Oral Pulse Rate: 96 Resp: 18 BP: (!) 188/108 Oxygen Therapy SpO2: 93 % O2 Device: Room Air Pain:   ADL: ADL Eating: Set up Where Assessed-Eating: Chair Grooming: Supervision/safety Where Assessed-Grooming: Chair Upper Body Bathing: Minimal assistance Where Assessed-Upper Body Bathing: Sitting at sink Lower Body Bathing: Moderate assistance Where Assessed-Lower Body Bathing: Edge of bed Upper Body Dressing: Minimal assistance Where Assessed-Upper Body Dressing: Edge of bed Lower Body Dressing: Maximal assistance Where Assessed-Lower Body Dressing: Edge of bed Toileting: Unable to assess Vision   Perception    Praxis   Balance   Exercises:   Other Treatments:     Therapy/Group: Individual Therapy  Duayne Cal 04/18/2022, 8:45 AM

## 2022-04-18 NOTE — Progress Notes (Signed)
Pt BP 188/108 this am. PRN  IV metoprolol per order not given per Linna Hoff A. (PA). Will give scheduled Coreg at 7 am. Pt is asymptomatic.

## 2022-04-19 LAB — GLUCOSE, CAPILLARY
Glucose-Capillary: 139 mg/dL — ABNORMAL HIGH (ref 70–99)
Glucose-Capillary: 212 mg/dL — ABNORMAL HIGH (ref 70–99)
Glucose-Capillary: 121 mg/dL — ABNORMAL HIGH (ref 70–99)
Glucose-Capillary: 170 mg/dL — ABNORMAL HIGH (ref 70–99)

## 2022-04-19 NOTE — Progress Notes (Signed)
Pt continues to refuse colace at hs. Pt stated his LBM was on 9/28 and does not have problem moving his bowel. Educated pt on the need to have a regular bowel pattern. Pt refused any intervention for no bm over 72 hr.

## 2022-04-19 NOTE — Progress Notes (Signed)
Pt was up watching TV till 2:30 am. Encouraged pt to get some rest to be able to do therapy effectively in the morning. Pt refused colace at hs stated he had bm with therapy on 9/28. He reported normal bm pattern. Continue to refuse bed alarm and educated pt and family the need for staff to assist the risk involved.                                     Dessie Coma RN

## 2022-04-20 LAB — GLUCOSE, CAPILLARY
Glucose-Capillary: 203 mg/dL — ABNORMAL HIGH (ref 70–99)
Glucose-Capillary: 119 mg/dL — ABNORMAL HIGH (ref 70–99)
Glucose-Capillary: 152 mg/dL — ABNORMAL HIGH (ref 70–99)

## 2022-04-20 NOTE — Progress Notes (Signed)
Occupational Therapy Session Note  Patient Details  Name: Samuel Chavez MRN: [id number] Date of Birth: December 28, 1975  Session 1 Today's Date: 04/20/2022 OT Individual Time: 1300-1330 OT Individual Time Calculation (min): 30 min   Session 2 Today's Date: 04/20/2022 OT Individual Time: 1415-1440 OT Individual Time Calculation (min): 25 min  and Today's Date: 04/20/2022 OT Missed Time: 20 Minutes Missed Time Reason: Patient fatigue   Short Term Goals: Week 2:  OT Short Term Goal 1 (Week 2): Pt will complete toileting tasks with min A OT Short Term Goal 2 (Week 2): Pt will complete stand pivot transfers with CGA OT Short Term Goal 3 (Week 2): Pt will complete one ADL in standing (1 min)  to demo improved activity tolerance OT Short Term Goal 4 (Week 2): Pt will complete bed mobility with no more than CGA  Skilled Therapeutic Interventions/Progress Updates:    Session 1  Pt received sitting in the w/c with 7/10 pain in his back and legs but reporting he is premedicated. He was taken via w/c to the therapy gym. He completed HIIT style training to target functional activity tolerance gains and cardiovascular endurance to facilitate ADL and IADL participation. He completed a 4 min interval with gradual increase in exertion levels over each minute at level 7.0. His Spo2 was checked and he dropped to 84% following but reported he was holding his breath for the final 30 seconds. Educated pt on breathing technique and using breath to power movement. He opened up to OT re current struggles going on in his family and OT provided emotional support and therapeutic listening. He completed one more 5 min interval with increase in resistance to 10.0. He had improved breathing technique and SpO2 following was 89%, requiring 1 min rest to increase to >94%. Pt returned to his room and was left sitting up with all needs met, his son present.    Session 2 Pt received sitting in the w/c with 7/10 pain in his back  and legs but reporting he has just had pain medication. He was taken via w/c to the therapy gym. Pt spirits low and pt reporting he is "not in a good headspace" d/t ongoing home issues. OT provided ongoing emotional support and encouragement. He completed 5 ft of functional mobility to the mat with CGA using the bariatric RW. He initiated functional mobility with a goal of 40 ft and within 7 ft his R knee gave out slightly, requiring mod A to recover. Pt very upset and reporting "I just can't do this today" and requesting to sit back down. He was able to safely return to the mat. He completed 5x sit <> stand from the mat and then asked to return to his room. Discussed depression/anxiety medications and s/s and encouraged pt to discuss further with MD if he feels medications initiated are not working. He was left sitting up in the w/c with his son present. 20 min missed.     Therapy Documentation Precautions:  Precautions Precautions: Fall Precaution Comments: PEG, recently decannulated Restrictions Weight Bearing Restrictions: No Other Position/Activity Restrictions: HOB 30 deg- trach   Therapy/Group: Individual Therapy  Curtis Sites 04/20/2022, 7:22 AM

## 2022-04-20 NOTE — Progress Notes (Signed)
Physical Therapy Session Note  Patient Details  Name: Samuel Chavez MRN: [id number] Date of Birth: Jul 10, 1976  Today's Date: 04/20/2022 PT Individual Time: 1049-1125 PT Individual Time Calculation (min): 36 min   Short Term Goals: Week 2:  PT Short Term Goal 1 (Week 2): STG=LTG seondary to ELOS  Skilled Therapeutic Interventions/Progress Updates:      Therapy Documentation Precautions:  Precautions Precautions: Fall Precaution Comments: PEG, recently decannulated Restrictions Weight Bearing Restrictions: No Other Position/Activity Restrictions: HOB 30 deg- trach  Pt received seated on toilet with NT present. Pt declined PT assist with toilet transfer and with (S) performed stand pivot with RW to w/c. Pt transported by w/c to main gym for time management and energy conservation. Pt negotiated 4 steps (6 inches) and requires CGA with ascending and min A with descending and verbal cues for sequencing. Pt requested to return to room and session ended early. Pt left seated in w/c at bedside with MD present and all needs in reach.    Therapy/Group: Individual Therapy  Verl Dicker Verl Dicker PT, DPT  04/20/2022, 7:53 AM

## 2022-04-20 NOTE — Plan of Care (Signed)
  Problem: Consults Goal: RH GENERAL PATIENT EDUCATION Description: See Patient Education module for education specifics. Outcome: Progressing   Problem: RH BOWEL ELIMINATION Goal: RH STG MANAGE BOWEL WITH ASSISTANCE Description: STG Manage Bowel with mod I  Assistance. Outcome: Progressing Goal: RH STG MANAGE BOWEL W/MEDICATION W/ASSISTANCE Description: STG Manage Bowel with Medication with mod I  Assistance. Outcome: Progressing   Problem: RH BLADDER ELIMINATION Goal: RH STG MANAGE BLADDER WITH ASSISTANCE Description: STG Manage Bladder With toileting Assistance Outcome: Progressing   Problem: RH SAFETY Goal: RH STG ADHERE TO SAFETY PRECAUTIONS W/ASSISTANCE/DEVICE Description: STG Adhere to Safety Precautions With Assistance/Device. 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

## 2022-04-20 NOTE — Progress Notes (Addendum)
PROGRESS NOTE   Subjective/Complaints:  Seen and evaluated after therapies. Complaining of increased edema and pain in his right foot > left leg. Otherwise, no concerns.   ROS: Denies fevers, chills, N/V, abdominal pain, constipation, diarrhea, SOB, cough, chest pain, new weakness or paraesthesias.      Objective:   No results found. No results for input(s): "WBC", "HGB", "HCT", "PLT" in the last 72 hours.  No results for input(s): "NA", "K", "CL", "CO2", "GLUCOSE", "BUN", "CREATININE", "CALCIUM" in the last 72 hours.   Intake/Output Summary (Last 24 hours) at 04/20/2022 2243 Last data filed at 04/20/2022 1819 Gross per 24 hour  Intake 720 ml  Output 1550 ml  Net -830 ml      Pressure Injury 03/08/22 Buttocks Left Stage 3 -  Full thickness tissue loss. Subcutaneous fat may be visible but bone, tendon or muscle are NOT exposed. open, red/pink and yellow; WOC updated Staging to Stage 3 03/10/22, changed to stage 2 on 04/07/22; ad (Active)  03/08/22 1757  Location: Buttocks  Location Orientation: Left  Staging: Stage 3 -  Full thickness tissue loss. Subcutaneous fat may be visible but bone, tendon or muscle are NOT exposed.  Wound Description (Comments): open, red/pink and yellow; WOC updated Staging to Stage 3 03/10/22, changed to stage 2 on 04/07/22; admit to rehab  Present on Admission: Yes    Physical Exam: Vital Signs Blood pressure (!) 152/84, pulse 85, temperature 99.2 F (37.3 C), resp. rate 18, height 5\' 9"  (1.753 m), weight (!) 162.3 kg, SpO2 92 %.  Constitutional: No distress . Appears mildly uncomfortable due to headache. Vital signs reviewed. HEENT: NCAT, EOMI, oral membranes moist Neck: supple Cardiovascular: RRR without murmur. No JVD    Respiratory/Chest: CTA Bilaterally without wheezes or rales. Normal effort    GI/Abdomen: BS +, non-tender, non-distended Ext: no clubbing, cyanosis; 3+ RLE edema, 2+ LLE  edema, pitting. Negative homann's bilaterally. Distal pulses intact.  Psych: pleasant and cooperative    Skin: No evidence of breakdown, no evidence of rash. Trach stoma dressed, small pinpoint opening remains, clean and dry.  Neurologic: Alert, Cranial nerves II through XII grossly intact, motor strength is 5/5 in bilateral deltoid, bicep, tricep, grip, 3-hip flexor, 3- knee extensors,4/5 ankle dorsiflexor and plantar flexor Sensory exam normal sensation to light touch  in bilateral upper and lower extremities   Musculoskeletal: Right knee tenderness at/around patella  Assessment/Plan: 1. Functional deficits which require 3+ hours per day of interdisciplinary therapy in a comprehensive inpatient rehab setting. Physiatrist is providing close team supervision and 24 hour management of active medical problems listed below. Physiatrist and rehab team continue to assess barriers to discharge/monitor patient progress toward functional and medical goals  Care Tool:  Bathing    Body parts bathed by patient: Right arm, Left arm, Abdomen, Front perineal area, Chest, Right upper leg, Left upper leg, Right lower leg, Face   Body parts bathed by helper: Buttocks     Bathing assist Assist Level: Moderate Assistance - Patient 50 - 74%     Upper Body Dressing/Undressing Upper body dressing   What is the patient wearing?: Hospital gown only    Upper body assist Assist  Level: Moderate Assistance - Patient 50 - 74%    Lower Body Dressing/Undressing Lower body dressing      What is the patient wearing?: Pants     Lower body assist Assist for lower body dressing: Moderate Assistance - Patient 50 - 74%     Toileting Toileting Toileting Activity did not occur (Clothing management and hygiene only): N/A (no void or bm)  Toileting assist Assist for toileting: 2 Helpers     Transfers Chair/bed transfer  Transfers assist     Chair/bed transfer assist level: Moderate Assistance - Patient 50  - 74%     Locomotion Ambulation   Ambulation assist      Assist level: Maximal Assistance - Patient 25 - 49% Assistive device:  (HDRW) Max distance: 8'   Walk 10 feet activity   Assist  Walk 10 feet activity did not occur: Safety/medical concerns (Unable to assess gait, curb, stair mobility and picking up an item from the floor at this time secondary to impaired endurance/activity tolerance and decreased global strength)        Walk 50 feet activity   Assist Walk 50 feet with 2 turns activity did not occur: Safety/medical concerns         Walk 150 feet activity   Assist Walk 150 feet activity did not occur: Safety/medical concerns         Walk 10 feet on uneven surface  activity   Assist Walk 10 feet on uneven surfaces activity did not occur: Safety/medical concerns         Wheelchair     Assist Is the patient using a wheelchair?: Yes Type of Wheelchair: Manual    Wheelchair assist level: Total Assistance - Patient < 25% Max wheelchair distance: Patient propelled manual wheelchair ~15-20' and therapist completed the remaining distance with total assist    Wheelchair 50 feet with 2 turns activity    Assist        Assist Level: Maximal Assistance - Patient 25 - 49%   Wheelchair 150 feet activity     Assist      Assist Level: Total Assistance - Patient < 25%   Blood pressure (!) 152/84, pulse 85, temperature 99.2 F (37.3 C), resp. rate 18, height 5\' 9"  (1.753 m), weight (!) 162.3 kg, SpO2 92 %.    Medical Problem List and Plan: 1. 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  Antithrombotics: -DVT/anticoagulation:  Pharmaceutical: Eliquis             -antiplatelet therapy: none 3. Pain Management: Tylenol as needed             -Neurontin  BID, Lidoderm daily; oxycodone and Flexeril prn for sciatica and neuropathy  9/20 Gabapentin 600mg  BID increased to 600mg  TID  -Continue oxycodone current dose for now, continue to monitor for effect of increased gabapentin, consider addition of duloxetine  -knee pain resolved after w/e fall    4. Mood/Behavior/Sleep: LCSW to evaluate and provide emotional support             -antipsychotic agents: n/a             -depression: continue Lexapro  9/28 -Ativan PRN anxiety--we discussed a scheduled option for his  anxiety control beyond lexapro and ativan. I told him that we don't intend on sending him home on much more than qd to bid prn ativan.  5. Neuropsych/cognition: This patient is capable of making decisions on his own behalf. 6. Skin/Wound Care: Routine skin care checks             -right ischial DTI             -routine trach and PEG care 7. 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              -continue Duo neb prn  -9/20 pulm consult- Decannulated, appreciate assistance  9: Heart failure with preserved EF:             Continue Coreg 6.25 mg BID             -daily weight  9/21 daily weight overall decreased from 167.8 kg on 9/18, continue to monitor Filed Weights   04/19/22 0351 04/19/22 0614 04/20/22 0511  Weight: (!) 162.3 kg (!) 160.9 kg (!) 162.3 kg   - 10/1 - stable  10: Left ear wax: continue Debrox BID 11: Morbid obesity: BMI 54.64; dietary counseling 12: LLE sciatica: continue Neurontin and Flexeril 13: DM: CBGs 4 times daily             -continue Levemir 22 units BID             -SSI  CBG (last 3)  Recent Labs    04/20/22 0602 04/20/22 1637 04/20/22 2055  GLUCAP 203* 119* 152*       -9/25 CBGs a litter higher this AM but overall well controlled, continue to monitor trend  9/28 pt now on regular diet per request--sugars reasonable 14: Decreased serum magnesium:   -9/26 Mag-Ox 400mg  daily started  -recheck monday 15: Anemia:   -HGB stable at 10.0 9/25 16: RUE brachial DVT secondary to PICC: on Eliquis             - LE duplex ordered for patient endorsement of increased edema, pain RLE. Adding bilateral TED hose.  17: Vitamin D deficiency: continue supplementation 18: Left knee pain: continue Voltaren gel, Lidoderm 19: Tobacco use: Cessation counseling 20.  Dysphagia improved s/p PEG 8/25, cannot remove earlier than 04/25/22, pt now eating regular diet   -this has been discussed with pt numerous times 21. Low Albumin 3.0 on 9/19, encourage protein intake  22. Constipation  -Pt reports LBM 9/25, none recorded since 9/22  -give 60cc sorbitol today 9/28  -9/29 Reports multiple BM today 23. Tobacco use   -Start ednicotine patch 24. Dry Skin  -Eucerin lotion 25. HTN  -9/26 Increased coreg to 12.5mg  BID last night, monitor for response    -9/28 remains elevated, increase coreg to 18.75mg  bid   -9/29 Start lisinopril 10mg  daily    04/20/2022    8:07 PM 04/20/2022    2:33 PM 04/20/2022    5:11 AM  Vitals with BMI  Weight   357 lbs 13 oz  BMI   42.59  Systolic 563 875 643  Diastolic 84 78 74  Pulse 85 91  91   - 10/1 stable, mildly hypertensive - monitor  26. HA   -9/29 Tylenol was given, his HA improved    LOS: 13 days A FACE TO [place] 04/20/2022, 10:43 PM

## 2022-04-20 NOTE — Progress Notes (Signed)
Speech Language Pathology Daily Session Note  Patient Details  Name: Samuel Chavez MRN: [id number] Date of Birth: Mar 04, 1976  Today's Date: 04/20/2022 SLP Individual Time: 1324-4010 SLP Individual Time Calculation (min): 45 min  Short Term Goals: Week 2: SLP Short Term Goal 1 (Week 2): Patient will demonstrate complex problem solving for functional tasks with supervision verbal and visual cues. SLP Short Term Goal 2 (Week 2): Patient will recall new, daily information with use of external aids with Min verbal and visual cues.  Skilled Therapeutic Interventions:  Pt was seen for skilled ST targeting cognitive goals.  SLP administered the [facility] standardized cognitive assessment to measure progress from initial evaluation given primary SLP's reports that pt may be nearing baseline.  Pt scored 23/30 on assessment which places him slightly below the range of normal limits when baseline education is accounted for  (pt did not complete high school).  Deficits were noted in mental math, backwards digit repetition, and story recall subtests, which could be indicative of ongoing challenges in working memory.  Pt was not surprised by results and states that he probably could have done better but he wasn't giving tasks his full attention as he's currently preoccupied with things happening at home (pt did not elaborate).  Pt feels he is back to baseline cognition and SLP informed him that she would relay his comments and test results back to primary SLP to help make decisions regarding pt's current plan of care.  Pt was left in wheelchair with call bell within reach.  Continue per current plan of care.    Pain Pain Assessment Pain Scale: 0-10 Pain Score: 0-No pain    Therapy/Group: Individual Therapy  Hebe Merriwether, Selinda Orion 04/20/2022, 12:19 PM

## 2022-04-21 ENCOUNTER — Ambulatory Visit (HOSPITAL_COMMUNITY): Payer: Medicaid Other | Attending: Physical Medicine and Rehabilitation

## 2022-04-21 DIAGNOSIS — M7989 Other specified soft tissue disorders: Secondary | ICD-10-CM | POA: Diagnosis present

## 2022-04-21 DIAGNOSIS — I10 Essential (primary) hypertension: Secondary | ICD-10-CM

## 2022-04-21 DIAGNOSIS — R609 Edema, unspecified: Secondary | ICD-10-CM

## 2022-04-21 DIAGNOSIS — R600 Localized edema: Secondary | ICD-10-CM | POA: Insufficient documentation

## 2022-04-21 DIAGNOSIS — R1013 Epigastric pain: Secondary | ICD-10-CM

## 2022-04-21 LAB — GLUCOSE, CAPILLARY
Glucose-Capillary: 144 mg/dL — ABNORMAL HIGH (ref 70–99)
Glucose-Capillary: 142 mg/dL — ABNORMAL HIGH (ref 70–99)
Glucose-Capillary: 136 mg/dL — ABNORMAL HIGH (ref 70–99)
Glucose-Capillary: 217 mg/dL — ABNORMAL HIGH (ref 70–99)

## 2022-04-21 LAB — CBC
HCT: 33.3 % — ABNORMAL LOW (ref 39.0–52.0)
Hemoglobin: 9.9 g/dL — ABNORMAL LOW (ref 13.0–17.0)
MCH: 27.3 pg (ref 26.0–34.0)
MCHC: 29.7 g/dL — ABNORMAL LOW (ref 30.0–36.0)
MCV: 92 fL (ref 80.0–100.0)
Platelets: 199 10*3/uL (ref 150–400)
RBC: 3.62 MIL/uL — ABNORMAL LOW (ref 4.22–5.81)
RDW: 17.7 % — ABNORMAL HIGH (ref 11.5–15.5)
WBC: 7.1 10*3/uL (ref 4.0–10.5)
nRBC: 0 % (ref 0.0–0.2)

## 2022-04-21 LAB — BASIC METABOLIC PANEL
Anion gap: 7 (ref 5–15)
BUN: 6 mg/dL (ref 6–20)
CO2: 30 mmol/L (ref 22–32)
Calcium: 8.7 mg/dL — ABNORMAL LOW (ref 8.9–10.3)
Chloride: 103 mmol/L (ref 98–111)
Creatinine, Ser: 0.66 mg/dL (ref 0.61–1.24)
GFR, Estimated: >60 mL/min (ref >60)
Glucose, Bld: 141 mg/dL — ABNORMAL HIGH (ref 70–99)
Potassium: 3.9 mmol/L (ref 3.5–5.1)
Sodium: 140 mmol/L (ref 135–145)

## 2022-04-21 LAB — MAGNESIUM: Magnesium: 1.5 mg/dL — ABNORMAL LOW (ref 1.7–2.4)

## 2022-04-21 MED ORDER — LISINOPRIL 20 MG PO TABS
20.0000 mg | ORAL_TABLET | Freq: Every day | ORAL | Status: DC
  Administered 2022-04-22 – 2022-04-26 (×5): 20 mg via ORAL
  Filled 2022-04-21 (×5): qty 1

## 2022-04-21 MED ORDER — MAGNESIUM OXIDE -MG SUPPLEMENT 400 (240 MG) MG PO TABS
400.0000 mg | ORAL_TABLET | Freq: Two times a day (BID) | ORAL | Status: DC
  Administered 2022-04-21 – 2022-04-29 (×16): 400 mg via ORAL
  Filled 2022-04-21 (×16): qty 1

## 2022-04-21 NOTE — Progress Notes (Signed)
BLE venous duplex has been completed.   Results can be found under chart review under CV PROC. 04/21/2022 3:22 PM Normand Damron RVT, RDMS

## 2022-04-21 NOTE — Progress Notes (Signed)
Physical Therapy Weekly Progress Note  Patient Details  Name: Samuel Chavez MRN: [id number] Date of Birth: Jan 02, 1976  Beginning of progress report period: April 08, 2022 End of progress report period: April 21, 2022  Today's Date: 04/21/2022 PT Individual Time: 1st Treatment Session: [phone]; 2nd Treatment Session: 1130-1200  PT Individual Time Calculation (min): 45 min; 30 min  Samuel Chavez has made progress toward his long-term goals since initial evaluation. He currently requires supv for bed mobility; SBA for transfers and gait up to 25' without a wheelchair follow and CGA/MinA for stair mobility with B HR. Patient continues to demonstrate impaired endurance/activity tolerance and global strength deficits, however improving with time. Patient is motivated to improve his overall independence prior to discharge home. No new STGs made secondary to ELOS.   Patient continues to demonstrate the following deficits muscle weakness, decreased cardiorespiratoy endurance, and decreased standing balance and decreased balance strategies and therefore will continue to benefit from skilled PT intervention to increase functional independence with mobility.  Patient progressing toward long term goals..  Continue plan of care.  PT Short Term Goals Week 1:  PT Short Term Goal 1 (Week 1): Patient will complete bed mobility with MinA PT Short Term Goal 1 - Progress (Week 1): Met PT Short Term Goal 2 (Week 1): Patient will perform stand pivot transfer with HDRW and MinA PT Short Term Goal 2 - Progress (Week 1): Met PT Short Term Goal 3 (Week 1): Patient will ambulate 98' with HDRW and MinA PT Short Term Goal 3 - Progress (Week 1): Met Week 2:  PT Short Term Goal 1 (Week 2): STG=LTG seondary to ELOS Week 3:  PT Short Term Goal 1 (Week 3): STG=LTG secondary to ELOS  Skilled Therapeutic Interventions/Progress Updates:  1st Treatment Session- Patient greeted supine in bed reporting stomach pain at G-tube  site, however with increased time agreeable to PT treatment session. Patient transitioned from semi-reclined to sitting EOB with supv and use of bed rails. Patient performed sit/stand and stand pivot transfer to wheelchair with HDRW and SBA. Patient wheeled to the rehab gym for time management.   Patient performed sit/stand from wheelchair and then stand pivot transfer to mat table with HDRW and SBA- Good recall of appropriate hand placement throughout.   Gait 3 x 22' with HDRW and SBA- Patient demonstrated good control throughout gait trials with increased ease noted with increased reps. Patient required an extended seated rest break after each trial secondary to reports of fatigue.   Patient returned to his room sitting upright in wheelchair with tray table in front, call bell within reach and all needs met.    2nd Treatment Session- Patient greeted sitting upright in wheelchair in room and agreeable to PT treatment session. Patient wheeled to rehab gym for time management.  Patient ascended/descended x4 steps with B HR and CGA for safety- Patient with increased use of B UE throughout stair mobility, however no physical assistance required from therapist. Patient demonstrated inconsistent pattern alternating between reciprocal and step-to throughout stair mobility.   Patient propelled manual wheelchair from main gym to his room (~150') with B UE and supv. Patient required increased time secondary to decreased global strength and impaired endurance/activity tolerance.   Therapist and patient discussed discharge planning, equipment to be ordered, family training etc. In order to ensure a safe discharge plan.   Patient left sitting upright in wheelchair with tray table in front, call bell within reach, son present and all needs met.  Therapy Documentation Precautions:  Precautions Precautions: Fall Precaution Comments: PEG, recently decannulated Restrictions Weight Bearing Restrictions:  No Other Position/Activity Restrictions: HOB 30 deg- trach  Therapy/Group: Individual Therapy  Samuel Chavez 04/21/2022, 7:52 AM

## 2022-04-21 NOTE — Progress Notes (Signed)
PROGRESS NOTE   Subjective/Complaints:  Pt in bed this AM. Reports some abdominal discomfort this AM, has not tried maalox yet. Reports LBM yesterday.   ROS: Denies fevers, chills, N/V, constipation, diarrhea, SOB, cough, chest pain, new weakness or paraesthesias.      Objective:   No results found. Recent Labs    04/21/22 0655  WBC 7.1  HGB 9.9*  HCT 33.3*  PLT 199    Recent Labs    04/21/22 0655  NA 140  K 3.9  CL 103  CO2 30  GLUCOSE 141*  BUN 6  CREATININE 0.66  CALCIUM 8.7*     Intake/Output Summary (Last 24 hours) at 04/21/2022 0805 Last data filed at 04/21/2022 0800 Gross per 24 hour  Intake 1000 ml  Output 2100 ml  Net -1100 ml      Pressure Injury 03/08/22 Buttocks Left Stage 3 -  Full thickness tissue loss. Subcutaneous fat may be visible but bone, tendon or muscle are NOT exposed. open, red/pink and yellow; WOC updated Staging to Stage 3 03/10/22, changed to stage 2 on 04/07/22; ad (Active)  03/08/22 1757  Location: Buttocks  Location Orientation: Left  Staging: Stage 3 -  Full thickness tissue loss. Subcutaneous fat may be visible but bone, tendon or muscle are NOT exposed.  Wound Description (Comments): open, red/pink and yellow; WOC updated Staging to Stage 3 03/10/22, changed to stage 2 on 04/07/22; admit to rehab  Present on Admission: Yes    Physical Exam: Vital Signs Blood pressure (!) 182/94, pulse 87, temperature 98 F (36.7 C), resp. rate 18, height 5\' 9"  (1.753 m), weight (!) 162.5 kg, SpO2 99 %.  Constitutional: No distress . Appears mildly uncomfortable due to headache. Vital signs reviewed. HEENT: NCAT, EOMI, oral membranes moist Neck: supple Cardiovascular: RRR without murmur. No JVD    Respiratory/Chest: CTA Bilaterally without wheezes or rales. Normal effort    GI/Abdomen: BS +, mildly tender upper abdomen, non-distended Ext: no clubbing, cyanosis; 3+ RLE edema, 2+ LLE  edema, pitting. Negative homann's bilaterally. Distal pulses intact.  Psych: pleasant and cooperative    Skin: No evidence of breakdown, no evidence of rash. Trach stoma dressed, small pinpoint opening remains, clean and dry.  Neurologic: Alert, Cranial nerves II through XII grossly intact, motor strength is 5/5 in bilateral deltoid, bicep, tricep, grip, 3-hip flexor, 3- knee extensors,4/5 ankle dorsiflexor and plantar flexor Sensory exam normal sensation to light touch  in bilateral upper and lower extremities   Musculoskeletal: Right knee tenderness at/around patella  Assessment/Plan: 1. Functional deficits which require 3+ hours per day of interdisciplinary therapy in a comprehensive inpatient rehab setting. Physiatrist is providing close team supervision and 24 hour management of active medical problems listed below. Physiatrist and rehab team continue to assess barriers to discharge/monitor patient progress toward functional and medical goals  Care Tool:  Bathing    Body parts bathed by patient: Right arm, Left arm, Abdomen, Front perineal area, Chest, Right upper leg, Left upper leg, Right lower leg, Face   Body parts bathed by helper: Buttocks     Bathing assist Assist Level: Moderate Assistance - Patient 50 - 74%  Upper Body Dressing/Undressing Upper body dressing   What is the patient wearing?: Hospital gown only    Upper body assist Assist Level: Moderate Assistance - Patient 50 - 74%    Lower Body Dressing/Undressing Lower body dressing      What is the patient wearing?: Pants     Lower body assist Assist for lower body dressing: Moderate Assistance - Patient 50 - 74%     Toileting Toileting Toileting Activity did not occur (Clothing management and hygiene only): N/A (no void or bm)  Toileting assist Assist for toileting: 2 Helpers     Transfers Chair/bed transfer  Transfers assist     Chair/bed transfer assist level: Moderate Assistance - Patient 50  - 74%     Locomotion Ambulation   Ambulation assist      Assist level: Maximal Assistance - Patient 25 - 49% Assistive device:  (HDRW) Max distance: 8'   Walk 10 feet activity   Assist  Walk 10 feet activity did not occur: Safety/medical concerns (Unable to assess gait, curb, stair mobility and picking up an item from the floor at this time secondary to impaired endurance/activity tolerance and decreased global strength)        Walk 50 feet activity   Assist Walk 50 feet with 2 turns activity did not occur: Safety/medical concerns         Walk 150 feet activity   Assist Walk 150 feet activity did not occur: Safety/medical concerns         Walk 10 feet on uneven surface  activity   Assist Walk 10 feet on uneven surfaces activity did not occur: Safety/medical concerns         Wheelchair     Assist Is the patient using a wheelchair?: Yes Type of Wheelchair: Manual    Wheelchair assist level: Total Assistance - Patient < 25% Max wheelchair distance: Patient propelled manual wheelchair ~15-20' and therapist completed the remaining distance with total assist    Wheelchair 50 feet with 2 turns activity    Assist        Assist Level: Maximal Assistance - Patient 25 - 49%   Wheelchair 150 feet activity     Assist      Assist Level: Total Assistance - Patient < 25%   Blood pressure (!) 182/94, pulse 87, temperature 98 F (36.7 C), resp. rate 18, height 5\' 9"  (1.753 m), weight (!) 162.5 kg, SpO2 99 %.    Medical Problem List and Plan: 1. 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  Antithrombotics: -DVT/anticoagulation:  Pharmaceutical: Eliquis             -antiplatelet therapy: none 3. Pain Management: Tylenol as needed             -Neurontin  BID, Lidoderm daily; oxycodone and Flexeril prn for sciatica and neuropathy  9/20 Gabapentin 600mg  BID increased to 600mg  TID  -Continue oxycodone current dose for now, continue to monitor for effect of increased gabapentin, consider addition of duloxetine  -knee pain resolved after w/e fall 4. Mood/Behavior/Sleep: LCSW to evaluate and provide emotional support             -antipsychotic agents: n/a             -  depression: continue Lexapro  9/28 -Ativan PRN anxiety--we discussed a scheduled option for his anxiety control beyond lexapro and ativan. I told him that we don't intend on sending him home on much more than qd to bid prn ativan.  5. Neuropsych/cognition: This patient is capable of making decisions on his own behalf. 6. Skin/Wound Care: Routine skin care checks             -right ischial DTI             -routine trach and PEG care 7. 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              -continue Duo neb prn  -9/20 pulm consult- Decannulated, appreciate assistance  9: Heart failure with preserved EF:             Continue Coreg 6.25 mg BID             -daily weight  9/21 daily weight overall decreased from 167.8 kg on 9/18, continue to monitor Filed Weights   04/19/22 0614 04/20/22 0511 04/21/22 0633  Weight: (!) 160.9 kg (!) 162.3 kg (!) 162.5 kg   - 10/1 - stable  10: Left ear wax: continue Debrox BID 11: Morbid obesity: BMI 54.64; dietary counseling 12: LLE sciatica: continue Neurontin and Flexeril 13: DM: CBGs 4 times daily             -continue Levemir 22 units BID             -SSI  CBG (last 3)  Recent Labs    04/20/22 1637 04/20/22 2055 04/21/22 0606  GLUCAP 119* 152* 144*      -9/25  CBGs a litter higher this AM but overall well controlled, continue to monitor trend  9/28 pt now on regular diet per request--sugars reasonable 14: Decreased serum magnesium:   -9/26 Mag-Ox 400mg  daily started  -10/2 increase Mag Ox to BID 15: Anemia:   -HGB stable at 10.0 9/25 16: RUE brachial DVT secondary to PICC: on Eliquis             - LE duplex ordered for patient endorsement of increased edema, pain RLE. Adding bilateral TED hose. -Pending 17: Vitamin D deficiency: continue supplementation 18: Left knee pain: continue Voltaren gel, Lidoderm 19: Tobacco use: Cessation counseling 20.  Dysphagia improved s/p PEG 8/25, cannot remove earlier than 04/25/22, pt now eating regular diet   -this has been discussed with pt numerous times 21. Low Albumin 3.0 on 9/19, encourage protein intake  22. Constipation  -Pt reports LBM 9/25, none recorded since 9/22  -give 60cc sorbitol today 9/28  -9/29 Reports multiple BM today  -10/2 LBM yesterday 23. Tobacco use   -Start ednicotine patch 24. Dry Skin  -Eucerin lotion 25. HTN  -9/26 Increased coreg to 12.5mg  BID last night, monitor for response    -9/28 remains elevated, increase coreg to 18.75mg  bid   -9/29 Start lisinopril 10mg  daily    04/21/2022    6:33 AM 04/21/2022    6:04 AM 04/21/2022    4:20 AM  Vitals with BMI  Weight 358 lbs 4  oz    BMI 69.24    Systolic  932 419  Diastolic  94 914  Pulse  87 91   - 10/1 stable, mildly hypertensive - monitor  -10/2 increase lisinopril to 20mg  daily 26. HA   -9/29 Tylenol was given, his HA improved 27. Dyspepsia  -Advised to try Maalox     LOS: 14 days A FACE TO FACE EVALUATION WAS PERFORMED  Jennye Boroughs 04/21/2022, 8:05 AM

## 2022-04-21 NOTE — Progress Notes (Signed)
Pt blood pressure was  BP 187/103 at 0420. PRN Hydralazine given per provider's order. Rechecked: 182/94. Pt is asymptomatic. Denied HA/Dizziness.

## 2022-04-22 LAB — GLUCOSE, CAPILLARY
Glucose-Capillary: 123 mg/dL — ABNORMAL HIGH (ref 70–99)
Glucose-Capillary: 129 mg/dL — ABNORMAL HIGH (ref 70–99)
Glucose-Capillary: 143 mg/dL — ABNORMAL HIGH (ref 70–99)
Glucose-Capillary: 160 mg/dL — ABNORMAL HIGH (ref 70–99)
Glucose-Capillary: 167 mg/dL — ABNORMAL HIGH (ref 70–99)

## 2022-04-22 NOTE — Progress Notes (Signed)
Physical Therapy Session Note  Patient Details  Name: Samuel Chavez MRN: [id number] Date of Birth: 01-Jun-1976  Today's Date: 04/22/2022 PT Individual Time: 1315-1400 PT Individual Time Calculation (min): 45 min   Short Term Goals: Week 1:  PT Short Term Goal 1 (Week 1): Patient will complete bed mobility with MinA PT Short Term Goal 1 - Progress (Week 1): Met PT Short Term Goal 2 (Week 1): Patient will perform stand pivot transfer with HDRW and MinA PT Short Term Goal 2 - Progress (Week 1): Met PT Short Term Goal 3 (Week 1): Patient will ambulate 75' with HDRW and MinA PT Short Term Goal 3 - Progress (Week 1): Met Week 2:  PT Short Term Goal 1 (Week 2): STG=LTG seondary to ELOS Week 3:  PT Short Term Goal 1 (Week 3): STG=LTG secondary to ELOS  Skilled Therapeutic Interventions/Progress Updates:  Patient greeted sitting upright in wheelchair in room with lunch tray in front of him- Patient requesting to finish his food prior to start of session. Therapist returned 15 minutes later and extended treatment session by 15 minutes in order to make scheduled therapy minutes. Patient wheeled to rehab gym for time management.   Patient gait trained 2 x 50' with HDRW and SBA without a wheelchair follow- Patient required minor VC for decreased cadence, stepping within the RW and keeping the RW in contact with the floor. Patient required extensive time prior to gait trials in order to mentally prepare and extended seated rest breaks after gait trial secondary to SOB and fatigue.   Patient performed x5 sit/stands without UE use and no AD with SBA- Patient unable to perform >5 at this time secondary to impaired endurance/activity tolerance and requesting to sit. Poor eccentric control noted during final rep secondary to fatigue.   Patient transitioned to OT care at end of treatment session.   Therapy Documentation Precautions:  Precautions Precautions: Fall Precaution Comments: PEG, recently  decannulated Restrictions Weight Bearing Restrictions: No Other Position/Activity Restrictions: HOB 30 deg- trach  Therapy/Group: Individual Therapy  Hayslee Casebolt 04/22/2022, 7:53 AM

## 2022-04-22 NOTE — Progress Notes (Signed)
Occupational Therapy Weekly Progress Note  Patient Details  Name: HENRYK URSIN MRN: [id number] Date of Birth: 02-12-1976  Beginning of progress report period: April 15, 2022 End of progress report period: April 22, 2022  Today's Date: 04/22/2022 OT Individual Time: 1400-1430 OT Individual Time Calculation (min): 30 min    Patient has met 3 of 4 short term goals.  Pt is currently functioning at [place] overall for functional mobility with RW and MIN- MOD A for LB ADLs. Pt has had limited participation with ADLs to demo and address ADL retraining strategies and frequently will state, "Ill have my kids help with that." Pt continues to need encouragement with practicing BADLs, but participation in tx has improved tremendously with building rapport, therapeutic use of self and pt being more open about mental and emotional struggles. Pt will continue to progress and make gains with skilled OT.   Patient continues to demonstrate the following deficits: muscle weakness, decreased cardiorespiratoy endurance and decreased oxygen support, and decreased sitting balance, decreased standing balance, decreased postural control, and decreased balance strategies and therefore will continue to benefit from skilled OT intervention to enhance overall performance with BADL, iADL, and Reduce care partner burden.  Patient progressing toward long term goals..  Continue plan of care.  OT Short Term Goals Week 2:  OT Short Term Goal 1 (Week 2): Pt will complete toileting tasks with min A OT Short Term Goal 1 - Progress (Week 2): Progressing toward goal OT Short Term Goal 2 (Week 2): Pt will complete stand pivot transfers with CGA OT Short Term Goal 2 - Progress (Week 2): Met OT Short Term Goal 3 (Week 2): Pt will complete one ADL in standing (1 min)  to demo improved activity tolerance OT Short Term Goal 3 - Progress (Week 2): Met OT Short Term Goal 4 (Week 2): Pt will complete bed mobility with no  more than CGA OT Short Term Goal 4 - Progress (Week 2): Met Week 3:  OT Short Term Goal 1 (Week 3): STG=LTG d/t ELOS  Skilled Therapeutic Interventions/Progress Updates:    1:1. Pt received in w/c with PT present. Pain premedicated and unrated.pt reporting fatigue but will to participate. Pt requesting to go outside. Pt provided total A tranport to courtyard for time management. Focus of time outside mobilizing over community surfaces with RW with supervision with w/c follow and motivational interviewing for lifestyle changes and coping with CLOF. Exited session with pt seated in w/c, exit alarm declined by pt but reporting will call if need to get up or need anything not in reach. Call light, phone andPS4 remote in reach   Therapy Documentation Precautions:  Precautions Precautions: Fall Precaution Comments: PEG, recently decannulated Restrictions Weight Bearing Restrictions: No Other Position/Activity Restrictions: HOB 30 deg- trach  Therapy/Group: Individual Therapy  Tonny Branch 04/22/2022, 11:32 AM

## 2022-04-22 NOTE — Progress Notes (Signed)
Physical Therapy Session Note  Patient Details  Name: Samuel Chavez MRN: [id number] Date of Birth: 09/08/1975  Today's Date: 04/22/2022 PT Individual Time: 1005-1033 PT Individual Time Calculation (min): 28 min   Short Term Goals: Week 2:  PT Short Term Goal 1 (Week 2): STG=LTG seondary to ELOS  Skilled Therapeutic Interventions/Progress Updates: Pt presents supine in bed but RLE hanging off bed, c/o 8/10 pain to back.  Nursing to bring pain meds.  Pt unable to participate at this time and PT to return as able.  PT returned for 2nd attempt (1 hour later) and pt sitting in w/c stating pain 2/10 after pain meds administered.  Pt agreed to LE there ex including standing calf raises, LAQ, hip flexion, abd/add 3 x 10-15.  Pt requires seated rest breaks 2/2 fatigue.  Knee high TEDS donned after nurse brings to room.  Pt handed off to OT.  Missed time of 32 min 2/2 pain.     Therapy Documentation Precautions:  Precautions Precautions: Fall Precaution Comments: PEG, recently decannulated Restrictions Weight Bearing Restrictions: No Other Position/Activity Restrictions: HOB 30 deg- trach General: PT Amount of Missed Time (min): 32 Minutes PT Missed Treatment Reason: Pain Vital Signs:  Pain:8/10 Pain Assessment Pain Scale: 0-10 Pain Score: 5  Pain Type: Chronic pain Pain Location: Back Pain Intervention(s): Medication (See eMAR)     Therapy/Group: Individual Therapy  Ladoris Gene 04/22/2022, 12:08 PM

## 2022-04-22 NOTE — Progress Notes (Signed)
PROGRESS NOTE   Subjective/Complaints:  Reports abdominal pain resolved yesterday. Asks again what day PEG tube can come out. BP has been elevated.  Reports LBM yesterday, last documented 10/1  ROS: Denies fevers, chills, N/V, constipation, diarrhea, SOB, cough, chest pain, new weakness or paraesthesias.      Objective:   VAS [place] LOWER EXTREMITY VENOUS (DVT)  Result Date: 04/21/2022  Lower Venous DVT Study Patient Name:  Samuel Chavez  Date of Exam:   04/21/2022 Medical Rec #: [id number]         Accession #:    [id number] Date of Birth: Jun 08, 1976         Patient Gender: M Patient Age:   46 years Exam Location:  [facility] Procedure:      VAS [place] LOWER EXTREMITY VENOUS (DVT) Referring Phys: Durel Salts --------------------------------------------------------------------------------  Indications: Edema.  Limitations: Body habitus and poor ultrasound/tissue interface. Comparison Study: Previous exams on 03/20/22, 03/04/22, and 02/15/22 all negative                   for DVT. Performing Technologist: Rogelia Rohrer RVT, RDMS  Examination Guidelines: A complete evaluation includes B-mode imaging, spectral Doppler, color Doppler, and power Doppler as needed of all accessible portions of each vessel. Bilateral testing is considered an integral part of a complete examination. Limited examinations for reoccurring indications may be performed as noted. The reflux portion of the exam is performed with the patient in reverse Trendelenburg.  +---------+---------------+---------+-----------+----------+-------------------+ RIGHT    CompressibilityPhasicitySpontaneityPropertiesThrombus Aging      +---------+---------------+---------+-----------+----------+-------------------+ CFV      Full           Yes      Yes                                      +---------+---------------+---------+-----------+----------+-------------------+ SFJ      Full                                                              +---------+---------------+---------+-----------+----------+-------------------+ FV Prox  Full           Yes      Yes                                      +---------+---------------+---------+-----------+----------+-------------------+ FV Mid   Full           Yes      Yes                                      +---------+---------------+---------+-----------+----------+-------------------+ FV DistalFull           Yes      Yes                                      +---------+---------------+---------+-----------+----------+-------------------+  PFV                     Yes      Yes                  patent by color and                                                       doppler             +---------+---------------+---------+-----------+----------+-------------------+ POP      Full           Yes      Yes                                      +---------+---------------+---------+-----------+----------+-------------------+ PTV      Full                                         Not well visualized +---------+---------------+---------+-----------+----------+-------------------+ PERO     Full                                         Not well visualized +---------+---------------+---------+-----------+----------+-------------------+   +--------+---------------+---------+-----------+----------+--------------------+ LEFT    CompressibilityPhasicitySpontaneityPropertiesThrombus Aging       +--------+---------------+---------+-----------+----------+--------------------+ CFV     Full           Yes      Yes                                       +--------+---------------+---------+-----------+----------+--------------------+ SFJ     Full                                                              +--------+---------------+---------+-----------+----------+--------------------+ FV Prox Full            Yes      Yes                                       +--------+---------------+---------+-----------+----------+--------------------+ FV Mid  Full           Yes      Yes                                       +--------+---------------+---------+-----------+----------+--------------------+ FV      Full           Yes      Yes  Distal                                                                    +--------+---------------+---------+-----------+----------+--------------------+ PFV                    Yes      Yes                  patent by                                                                 color/doppler        +--------+---------------+---------+-----------+----------+--------------------+ POP     Full           Yes      Yes                                       +--------+---------------+---------+-----------+----------+--------------------+ PTV     Full                                         Not well visualized  +--------+---------------+---------+-----------+----------+--------------------+ PERO    Full                                         Not well visualized  +--------+---------------+---------+-----------+----------+--------------------+     Summary: BILATERAL: - No evidence of deep vein thrombosis seen in the lower extremities, bilaterally. -No evidence of popliteal cyst, bilaterally. -Subcutaneous edema seen bilaterally in area of calves and ankles.  *See table(s) above for measurements and observations. Electronically signed by Servando Snare MD on 04/21/2022 at 7:50:47 PM.    Final    Recent Labs    04/21/22 0655  WBC 7.1  HGB 9.9*  HCT 33.3*  PLT 199    Recent Labs    04/21/22 0655  NA 140  K 3.9  CL 103  CO2 30  GLUCOSE 141*  BUN 6  CREATININE 0.66  CALCIUM 8.7*     Intake/Output Summary (Last 24 hours) at 04/22/2022 0900 Last data filed at 04/22/2022 0831 Gross per 24  hour  Intake --  Output 2100 ml  Net -2100 ml      Pressure Injury 03/08/22 Buttocks Left Stage 3 -  Full thickness tissue loss. Subcutaneous fat may be visible but bone, tendon or muscle are NOT exposed. open, red/pink and yellow; WOC updated Staging to Stage 3 03/10/22, changed to stage 2 on 04/07/22; ad (Active)  03/08/22 1757  Location: Buttocks  Location Orientation: Left  Staging: Stage 3 -  Full thickness tissue loss. Subcutaneous fat may be visible but bone, tendon or muscle are NOT exposed.  Wound Description (Comments): open, red/pink and yellow; WOC updated Staging to Stage 3 03/10/22, changed to stage  2 on 04/07/22; admit to rehab  Present on Admission: Yes    Physical Exam: Vital Signs Blood pressure (!) 188/92, pulse 90, temperature 98.1 F (36.7 C), resp. rate 20, height 5\' 9"  (1.753 m), weight (!) 159.3 kg, SpO2 93 %.  Constitutional: No distress . In bed, appears comfortable. Vital signs reviewed. HEENT: NCAT, conjugate gaze, oral membranes moist Neck: supple. Cardiovascular: RRR without murmur. No JVD    Respiratory/Chest: CTA Bilaterally without wheezes or rales. Normal effort    GI/Abdomen: BS +,NT, non-distended Ext: no clubbing, cyanosis; 3+ RLE edema, 2+ LLE edema, pitting. Negative homann's bilaterally. Distal pulses intact.  Psych: pleasant and cooperative    Skin: No evidence of breakdown, no evidence of rash. Trach stoma dressed, small pinpoint opening remains, clean and dry.  Neurologic: Alert, Cranial nerves II through XII grossly intact, motor strength is 5/5 in bilateral deltoid, bicep, tricep, grip, 3-hip flexor, 3- knee extensors,4/5 ankle dorsiflexor and plantar flexor Sensory exam normal sensation to light touch  in bilateral upper and lower extremities   Musculoskeletal: Right knee tenderness at/around patella  Assessment/Plan: 1. Functional deficits which require 3+ hours per day of interdisciplinary therapy in a comprehensive inpatient rehab  setting. Physiatrist is providing close team supervision and 24 hour management of active medical problems listed below. Physiatrist and rehab team continue to assess barriers to discharge/monitor patient progress toward functional and medical goals  Care Tool:  Bathing    Body parts bathed by patient: Right arm, Left arm, Abdomen, Front perineal area, Chest, Right upper leg, Left upper leg, Right lower leg, Face   Body parts bathed by helper: Buttocks     Bathing assist Assist Level: Moderate Assistance - Patient 50 - 74%     Upper Body Dressing/Undressing Upper body dressing   What is the patient wearing?: Hospital gown only    Upper body assist Assist Level: Moderate Assistance - Patient 50 - 74%    Lower Body Dressing/Undressing Lower body dressing      What is the patient wearing?: Pants     Lower body assist Assist for lower body dressing: Moderate Assistance - Patient 50 - 74%     Toileting Toileting Toileting Activity did not occur (Clothing management and hygiene only): N/A (no void or bm)  Toileting assist Assist for toileting: 2 Helpers     Transfers Chair/bed transfer  Transfers assist     Chair/bed transfer assist level: Moderate Assistance - Patient 50 - 74%     Locomotion Ambulation   Ambulation assist      Assist level: Maximal Assistance - Patient 25 - 49% Assistive device:  (HDRW) Max distance: 8'   Walk 10 feet activity   Assist  Walk 10 feet activity did not occur: Safety/medical concerns (Unable to assess gait, curb, stair mobility and picking up an item from the floor at this time secondary to impaired endurance/activity tolerance and decreased global strength)        Walk 50 feet activity   Assist Walk 50 feet with 2 turns activity did not occur: Safety/medical concerns         Walk 150 feet activity   Assist Walk 150 feet activity did not occur: Safety/medical concerns         Walk 10 feet on uneven surface   activity   Assist Walk 10 feet on uneven surfaces activity did not occur: Safety/medical concerns         Wheelchair     Assist Is the patient using  a wheelchair?: Yes Type of Wheelchair: Manual    Wheelchair assist level: Total Assistance - Patient < 25% Max wheelchair distance: Patient propelled manual wheelchair ~15-20' and therapist completed the remaining distance with total assist    Wheelchair 50 feet with 2 turns activity    Assist        Assist Level: Maximal Assistance - Patient 25 - 49%   Wheelchair 150 feet activity     Assist      Assist Level: Total Assistance - Patient < 25%   Blood pressure (!) 188/92, pulse 90, temperature 98.1 F (36.7 C), resp. rate 20, height 5\' 9"  (1.753 m), weight (!) 159.3 kg, SpO2 93 %.    Medical Problem List and Plan: 1. 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  Antithrombotics: -DVT/anticoagulation:  Pharmaceutical: Eliquis             -antiplatelet therapy: none 3. Pain Management: Tylenol as needed             -Neurontin BID, Lidoderm daily; oxycodone and Flexeril prn for sciatica and neuropathy  9/20 Gabapentin 600mg  BID increased to 600mg  TID  -Continue oxycodone current dose for now, continue to monitor for effect of increased gabapentin, consider addition of duloxetine  -knee pain resolved after w/e fall 4. Mood/Behavior/Sleep: LCSW to evaluate and provide emotional support             -antipsychotic agents: n/a             -depression: continue Lexapro  9/28 -Ativan PRN anxiety--we discussed a scheduled option for his anxiety control beyond lexapro and ativan. I told him that we don't intend on sending him home on much more than qd to bid prn ativan.  5. Neuropsych/cognition: This patient is capable of making  decisions on his own behalf. 6. Skin/Wound Care: Routine skin care checks             -right ischial DTI             -routine trach and PEG care 7. 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              -continue Duo neb prn  -9/20 pulm consult- Decannulated, appreciate assistance  9: Heart failure with preserved EF:             Continue Coreg 6.25 mg BID             -daily weight  9/21 daily weight overall decreased from 167.8 kg on 9/18, continue to monitor Filed Weights   04/20/22 0511 04/21/22 0633 04/22/22 0512  Weight: (!) 162.3 kg (!) 162.5 kg (!) 159.3 kg   - 10/3 down trending this AM, continue to monitor  10: Left ear wax: continue Debrox BID 11: Morbid obesity: BMI 54.64; dietary counseling 12: LLE sciatica: continue Neurontin and Flexeril 13: DM:  CBGs 4 times daily             -continue Levemir 22 units BID             -SSI  CBG (last 3)  Recent Labs    04/21/22 1620 04/21/22 2037 04/22/22 0619  GLUCAP 136* 217* 143*      -9/25 CBGs a litter higher this AM but overall well controlled, continue to monitor trend  9/28 pt now on regular diet per request--sugars reasonable 14: Decreased serum magnesium:   -9/26 Mag-Ox 400mg  daily started  -10/2 increase Mag Ox to BID 15: Anemia:   -HGB stable at 10.0 9/25 16: RUE brachial DVT secondary to PICC: on Eliquis             - LE duplex ordered for patient endorsement of increased edema, pain RLE. Adding bilateral TED hose. -neg for DVT 17: Vitamin D deficiency: continue supplementation 18: Left knee pain: continue Voltaren gel, Lidoderm 19: Tobacco use: Cessation counseling 20.  Dysphagia improved s/p PEG 8/25, cannot remove earlier than 04/25/22,  pt now eating regular diet   -this has been discussed with pt numerous times 21. Low Albumin 3.0 on 9/19, encourage protein intake  22. Constipation  -Pt reports LBM 9/25, none recorded since 9/22  -give 60cc sorbitol today 9/28  -9/29 Reports multiple BM today  -10/3 reports LBM yesterday 23. Tobacco use   -Start ednicotine patch 24. Dry Skin  -Eucerin lotion 25. HTN  -9/26 Increased coreg to 12.5mg  BID last night, monitor for response    -9/28 remains elevated, increase coreg to 18.75mg  bid   -9/29 Start lisinopril 10mg  daily    04/22/2022    5:12 AM 04/21/2022    7:48 PM 04/21/2022    1:15 PM  Vitals with BMI  Weight 351 lbs 3 oz    BMI 20.35    Systolic 597 416 384  Diastolic 92 93 85  Pulse 90 81 80   - 10/1 stable, mildly hypertensive - monitor  -10/2 increase lisinopril to 20mg  daily -10/3 monitor trend with increased lisinopril 26. HA   -9/29 Tylenol was given, his HA improved 27. Dyspepsia  -Advised to try Maalox   -Resolved, monitor    LOS: 15 days A FACE TO FACE EVALUATION WAS PERFORMED  Jennye Boroughs 04/22/2022, 9:00 AM

## 2022-04-22 NOTE — Progress Notes (Signed)
Occupational Therapy Session Note  Patient Details  Name: Samuel Chavez MRN: [id number] Date of Birth: 02/24/1976  Today's Date: 04/22/2022 OT Individual Time: 1694-5038 OT Individual Time Calculation (min): 58 min    Short Term Goals: Week 1:  OT Short Term Goal 1 (Week 1): Pt will don LB clothing with min A OT Short Term Goal 1 - Progress (Week 1): Met OT Short Term Goal 2 (Week 1): Pt will complete sit > stand with min A OT Short Term Goal 2 - Progress (Week 1): Met OT Short Term Goal 3 (Week 1): Pt will complete a stand pivot transfer with max A of 1 OT Short Term Goal 3 - Progress (Week 1): Met OT Short Term Goal 4 (Week 1): Pt will tolerate one minute of standing with no desaturation to demo improved activity tolerance OT Short Term Goal 4 - Progress (Week 1): Met  Skilled Therapeutic Interventions/Progress Updates:     Pt received in w/c with PT present. Pt with fluctuating back pain throughout that is premedicated and repositioning/rest provided for pain relief.   ADL: Pt and OT discuss prior footwear management. Pt reporting would bend forward or sit in external rotation in the bed to improve access to feet but pt concerned he would not be able ot do this now. OT demo sock aide, reacher and shoe funnel but does not make pt practice as does not have extra wide sock aide to use. Pt likes the tools but states will likely just have his kids help him.    Therapeutic exercise Pt completes 3x30 ball toss (chest, bounce, overhead pass) in unspported sitting position with 2 # wrist weights to improve BUE coordination/strengthening required for BADLs/functional transfers.   30 sec on/30 sec off circuit Sit to stand Lateral side stepping with band around knees Seated chest/overhead press Standing toe taps   Pt left at end of session in bed with exit alarm on, call light in reach and all needs met   Therapy Documentation Precautions:  Precautions Precautions:  Fall Precaution Comments: PEG, recently decannulated Restrictions Weight Bearing Restrictions: No Other Position/Activity Restrictions: HOB 30 deg- trach General:    Therapy/Group: Individual Therapy  Tonny Branch 04/22/2022, 6:52 AM

## 2022-04-23 LAB — GLUCOSE, CAPILLARY
Glucose-Capillary: 146 mg/dL — ABNORMAL HIGH (ref 70–99)
Glucose-Capillary: 116 mg/dL — ABNORMAL HIGH (ref 70–99)
Glucose-Capillary: 174 mg/dL — ABNORMAL HIGH (ref 70–99)
Glucose-Capillary: 145 mg/dL — ABNORMAL HIGH (ref 70–99)

## 2022-04-23 NOTE — Progress Notes (Signed)
Patient ID: Samuel Chavez, male   DOB: 1976/03/14, 46 y.o.   MRN: [id number] Met with pt to give him an update regarding team conference progress toward his goals of supervision/CGA level. Pt is doing well and pushing himself but extremely exhausted from it. Aware of discharge still 10/10. Discussed having daughter come in for education prior to discharge. Discussed medicaid will only cover bariatric rw or wheelchair not both. He has decided up walker. Aware MD to remove PEG tube while here he would like this it does bother him. Will see if can get home health or Op therapies for him at discharge.

## 2022-04-23 NOTE — Progress Notes (Signed)
PROGRESS NOTE   Subjective/Complaints:  Working with therapy today. No new concerns this AM.   LBM 10/4  ROS: Denies fevers, chills, N/V, constipation, diarrhea, SOB, cough, chest pain, new weakness or paraesthesias.      Objective:   VAS [place] LOWER EXTREMITY VENOUS (DVT)  Result Date: 04/21/2022  Lower Venous DVT Study Patient Name:  Samuel Chavez  Date of Exam:   04/21/2022 Medical Rec #: [id number]         Accession #:    [id number] Date of Birth: [birth year]         Patient Gender: M Patient Age:   46 years Exam Location:  [facility] Procedure:      VAS [place] LOWER EXTREMITY VENOUS (DVT) Referring Phys: Durel Salts --------------------------------------------------------------------------------  Indications: Edema.  Limitations: Body habitus and poor ultrasound/tissue interface. Comparison Study: Previous exams on 03/20/22, 03/04/22, and 02/15/22 all negative                   for DVT. Performing Technologist: Rogelia Rohrer RVT, RDMS  Examination Guidelines: A complete evaluation includes B-mode imaging, spectral Doppler, color Doppler, and power Doppler as needed of all accessible portions of each vessel. Bilateral testing is considered an integral part of a complete examination. Limited examinations for reoccurring indications may be performed as noted. The reflux portion of the exam is performed with the patient in reverse Trendelenburg.  +---------+---------------+---------+-----------+----------+-------------------+ RIGHT    CompressibilityPhasicitySpontaneityPropertiesThrombus Aging      +---------+---------------+---------+-----------+----------+-------------------+ CFV      Full           Yes      Yes                                      +---------+---------------+---------+-----------+----------+-------------------+ SFJ      Full                                                              +---------+---------------+---------+-----------+----------+-------------------+ FV Prox  Full           Yes      Yes                                      +---------+---------------+---------+-----------+----------+-------------------+ FV Mid   Full           Yes      Yes                                      +---------+---------------+---------+-----------+----------+-------------------+ FV DistalFull           Yes      Yes                                      +---------+---------------+---------+-----------+----------+-------------------+  PFV                     Yes      Yes                  patent by color and                                                       doppler             +---------+---------------+---------+-----------+----------+-------------------+ POP      Full           Yes      Yes                                      +---------+---------------+---------+-----------+----------+-------------------+ PTV      Full                                         Not well visualized +---------+---------------+---------+-----------+----------+-------------------+ PERO     Full                                         Not well visualized +---------+---------------+---------+-----------+----------+-------------------+   +--------+---------------+---------+-----------+----------+--------------------+ LEFT    CompressibilityPhasicitySpontaneityPropertiesThrombus Aging       +--------+---------------+---------+-----------+----------+--------------------+ CFV     Full           Yes      Yes                                       +--------+---------------+---------+-----------+----------+--------------------+ SFJ     Full                                                              +--------+---------------+---------+-----------+----------+--------------------+ FV Prox Full           Yes      Yes                                        +--------+---------------+---------+-----------+----------+--------------------+ FV Mid  Full           Yes      Yes                                       +--------+---------------+---------+-----------+----------+--------------------+ FV      Full           Yes      Yes  Distal                                                                    +--------+---------------+---------+-----------+----------+--------------------+ PFV                    Yes      Yes                  patent by                                                                 color/doppler        +--------+---------------+---------+-----------+----------+--------------------+ POP     Full           Yes      Yes                                       +--------+---------------+---------+-----------+----------+--------------------+ PTV     Full                                         Not well visualized  +--------+---------------+---------+-----------+----------+--------------------+ PERO    Full                                         Not well visualized  +--------+---------------+---------+-----------+----------+--------------------+     Summary: BILATERAL: - No evidence of deep vein thrombosis seen in the lower extremities, bilaterally. -No evidence of popliteal cyst, bilaterally. -Subcutaneous edema seen bilaterally in area of calves and ankles.  *See table(s) above for measurements and observations. Electronically signed by Servando Snare MD on 04/21/2022 at 7:50:47 PM.    Final    Recent Labs    04/21/22 0655  WBC 7.1  HGB 9.9*  HCT 33.3*  PLT 199    Recent Labs    04/21/22 0655  NA 140  K 3.9  CL 103  CO2 30  GLUCOSE 141*  BUN 6  CREATININE 0.66  CALCIUM 8.7*     Intake/Output Summary (Last 24 hours) at 04/23/2022 1210 Last data filed at 04/23/2022 0836 Gross per 24 hour  Intake 794 ml  Output 500 ml  Net 294 ml       Pressure Injury 03/08/22 Buttocks Left Stage 3 -  Full thickness tissue loss. Subcutaneous fat may be visible but bone, tendon or muscle are NOT exposed. open, red/pink and yellow; WOC updated Staging to Stage 3 03/10/22, changed to stage 2 on 04/07/22; ad (Active)  03/08/22 1757  Location: Buttocks  Location Orientation: Left  Staging: Stage 3 -  Full thickness tissue loss. Subcutaneous fat may be visible but bone, tendon or muscle are NOT exposed.  Wound Description (Comments): open, red/pink and yellow; WOC updated Staging to Stage 3 03/10/22, changed to  stage 2 on 04/07/22; admit to rehab  Present on Admission: Yes    Physical Exam: Vital Signs Blood pressure (!) 176/90, pulse 78, temperature 98.4 F (36.9 C), temperature source Oral, resp. rate 16, height 5\' 9"  (1.753 m), weight (!) 150.4 kg, SpO2 94 %.  Constitutional: No distress .working with therapy Vital signs reviewed. HEENT: NCAT, conjugate gaze, oral membranes moist Neck: supple. Cardiovascular: RRR without murmur. No JVD    Respiratory/Chest: CTA Bilaterally without wheezes or rales. Normal effort    GI/Abdomen: BS +,NT, non-distended Ext: no clubbing, cyanosis; 3+ RLE edema, 2+ LLE edema, pitting. Negative homann's bilaterally. Distal pulses intact.  Psych: a little anxious Skin: No evidence of breakdown, no evidence of rash. Trach stoma dressed, small pinpoint opening remains, clean and dry.  Neurologic: Alert, Cranial nerves II through XII grossly intact, motor strength is 5/5 in bilateral deltoid, bicep, tricep, grip, 3-hip flexor, 3- knee extensors,4/5 ankle dorsiflexor and plantar flexor Sensory exam normal sensation to light touch  in bilateral upper and lower extremities   Musculoskeletal: Right knee tenderness at/around patella  Assessment/Plan: 1. Functional deficits which require 3+ hours per day of interdisciplinary therapy in a comprehensive inpatient rehab setting. Physiatrist is providing close team  supervision and 24 hour management of active medical problems listed below. Physiatrist and rehab team continue to assess barriers to discharge/monitor patient progress toward functional and medical goals  Care Tool:  Bathing    Body parts bathed by patient: Right arm, Left arm, Abdomen, Front perineal area, Chest, Right upper leg, Left upper leg, Right lower leg, Face   Body parts bathed by helper: Buttocks     Bathing assist Assist Level: Moderate Assistance - Patient 50 - 74%     Upper Body Dressing/Undressing Upper body dressing   What is the patient wearing?: Hospital gown only    Upper body assist Assist Level: Moderate Assistance - Patient 50 - 74%    Lower Body Dressing/Undressing Lower body dressing      What is the patient wearing?: Pants     Lower body assist Assist for lower body dressing: Moderate Assistance - Patient 50 - 74%     Toileting Toileting Toileting Activity did not occur (Clothing management and hygiene only): N/A (no void or bm)  Toileting assist Assist for toileting: 2 Helpers     Transfers Chair/bed transfer  Transfers assist     Chair/bed transfer assist level: Moderate Assistance - Patient 50 - 74%     Locomotion Ambulation   Ambulation assist      Assist level: Maximal Assistance - Patient 25 - 49% Assistive device:  (HDRW) Max distance: 8'   Walk 10 feet activity   Assist  Walk 10 feet activity did not occur: Safety/medical concerns (Unable to assess gait, curb, stair mobility and picking up an item from the floor at this time secondary to impaired endurance/activity tolerance and decreased global strength)        Walk 50 feet activity   Assist Walk 50 feet with 2 turns activity did not occur: Safety/medical concerns         Walk 150 feet activity   Assist Walk 150 feet activity did not occur: Safety/medical concerns         Walk 10 feet on uneven surface  activity   Assist Walk 10 feet on uneven  surfaces activity did not occur: Safety/medical concerns         Wheelchair     Assist Is the patient using a  wheelchair?: Yes Type of Wheelchair: Manual    Wheelchair assist level: Total Assistance - Patient < 25% Max wheelchair distance: Patient propelled manual wheelchair ~15-20' and therapist completed the remaining distance with total assist    Wheelchair 50 feet with 2 turns activity    Assist        Assist Level: Maximal Assistance - Patient 25 - 49%   Wheelchair 150 feet activity     Assist      Assist Level: Total Assistance - Patient < 25%   Blood pressure (!) 176/90, pulse 78, temperature 98.4 F (36.9 C), temperature source Oral, resp. rate 16, height 5\' 9"  (1.753 m), weight (!) 150.4 kg, SpO2 94 %.    Medical Problem List and Plan: 1. 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  Antithrombotics: -DVT/anticoagulation:  Pharmaceutical: Eliquis             -antiplatelet therapy: none 3. Pain Management: Tylenol as needed             -Neurontin BID, Lidoderm daily; oxycodone and Flexeril prn for sciatica and neuropathy  9/20 Gabapentin 600mg  BID increased to 600mg  TID  -Continue oxycodone current dose for now, continue to monitor for effect of increased gabapentin, consider addition of duloxetine  -knee pain resolved after w/e fall 4. Mood/Behavior/Sleep: LCSW to evaluate and provide emotional support             -antipsychotic agents: n/a             -depression: continue Lexapro  9/28 -Ativan PRN anxiety--we discussed a scheduled option for his anxiety control beyond lexapro and ativan. I told him that we don't intend on sending him home on much more than qd to bid prn ativan.  5. Neuropsych/cognition: This patient is capable of making  decisions on his own behalf. 6. Skin/Wound Care: Routine skin care checks             -right ischial DTI             -routine trach and PEG care 7. 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              -continue Duo neb prn  -9/20 pulm consult- Decannulated, appreciate assistance  9: Heart failure with preserved EF:             Continue Coreg 6.25 mg BID             -daily weight  9/21 daily weight overall decreased from 167.8 kg on 9/18, continue to monitor Filed Weights   04/21/22 0633 04/22/22 0512 04/23/22 0540  Weight: (!) 162.5 kg (!) 159.3 kg (!) 150.4 kg   - 10/4 suspect weight not accurate, will ask for repeat  10: Left ear wax: continue Debrox BID 11: Morbid obesity: BMI 54.64; dietary counseling 12: LLE  sciatica: continue Neurontin and Flexeril 13: DM: CBGs 4 times daily             -continue Levemir 22 units BID             -SSI  CBG (last 3)  Recent Labs    04/22/22 2344 04/23/22 0617 04/23/22 1144  GLUCAP 167* 146* 116*      -9/25 CBGs a litter higher this AM but overall well controlled, continue to monitor trend  10/4 well controlled overall 14: Decreased serum magnesium:   -9/26 Mag-Ox 400mg  daily started  -10/2 increase Mag Ox to BID  -recheck monday 15: Anemia:   -HGB stable at 10.0 9/25 16: RUE brachial DVT secondary to PICC: on Eliquis             - LE duplex ordered for patient endorsement of increased edema, pain RLE. Adding bilateral TED hose. -neg for DVT 17: Vitamin D deficiency: continue supplementation 18: Left knee pain: continue Voltaren gel, Lidoderm 19: Tobacco use: Cessation counseling 20.  Dysphagia improved s/p PEG 8/25, cannot remove earlier than 04/25/22, pt now  eating regular diet   -this has been discussed with pt numerous times 21. Low Albumin 3.0 on 9/19, encourage protein intake  22. Constipation  -Pt reports LBM 9/25, none recorded since 9/22  -give 60cc sorbitol today 9/28  -9/29 Reports multiple BM today  -10/4 BM today, improved 23. Tobacco use   -Start ednicotine patch 24. Dry Skin  -Eucerin lotion 25. HTN  -9/26 Increased coreg to 12.5mg  BID last night, monitor for response    -9/28 remains elevated, increase coreg to 18.75mg  bid   -9/29 Start lisinopril 10mg  daily    04/23/2022    5:40 AM 04/23/2022    3:24 AM 04/22/2022    7:31 PM  Vitals with BMI  Weight 331 lbs 9 oz    BMI 77.41    Systolic  287 867  Diastolic  90 74  Pulse  78 81   - 10/1 stable, mildly hypertensive - monitor  -10/2 increase lisinopril to 20mg  daily -10/4 continue to monitor trend, consider increase medication tomorrow if BP still above goal 26. HA   -9/29 Tylenol was given, his HA improved 27. Dyspepsia  -Advised to try Maalox   -Resolved, monitor    LOS: 16 days A FACE TO FACE EVALUATION WAS PERFORMED  Jennye Boroughs 04/23/2022, 12:10 PM

## 2022-04-23 NOTE — Progress Notes (Signed)
Pt awake sitting in wheelchair all night watching TV. Declined laying down. Pt Stated he 'just stay up until in was time for therapy". Continues to watch TV. No further needs at this time.

## 2022-04-23 NOTE — Patient Care Conference (Signed)
Inpatient RehabilitationTeam Conference and Plan of Care Update Date: 04/23/2022   Time: 12:08 PM    Patient Name: Samuel Chavez      Medical Record Number: [id number]  Date of Birth: 1976-05-24 Sex: Male         Room/Bed: 4W17C/4W17C-01 Payor Info: Payor: [place] / Plan: [place] MEDICAID HEALTHY BLUE / Product Type: *No Product type* /    Admit Date/Time:  04/07/2022  1:35 PM  Primary Diagnosis:  Critical illness myopathy  Hospital Problems: Principal Problem:   Critical illness myopathy Active Problems:   Type 2 diabetes mellitus without complication, without long-term current use of insulin ([place])   Respiratory failure ([place])   Neuropathic pain   Reactive depression   Primary hypertension    Expected Discharge Date: Expected Discharge Date: 04/29/22  Team Members Present: Physician leading conference: Dr. Jennye Boroughs Social Worker Present: Ovidio Kin, LCSW Nurse Present: Dorien Chihuahua, RN PT Present: Other (comment) Jodi Mourning Rafoth, PT) OT Present: Laverle Hobby, OT SLP Present: Charolett Bumpers, SLP PPS Coordinator present : Ileana Ladd, PT     Current Status/Progress Goal Weekly Team Focus  Bowel/Bladder   Continent of B/B  Remain continent  Assist with toileting as needed   Swallow/Nutrition/ Hydration             ADL's   CGA-S for stand pivot transfers and short distance funcitonal moiblity,  S UB ADLS and Min-MOD A LB ADLs  supervision to [place]  AE training PRN, ADL retraining, strengthening, endruance, DC planning   Mobility   SBA with transfers with HDRW; SBA gait up to 25+ feet with HDRW without wc follow; MinA for 4 stairs with B HR  Supv/CGA  Endurance/activity tolerance, transfers, gait, dynamic stability, B LE strengthening, stair mobility   Communication             Safety/Cognition/ Behavioral Observations  Min-supervision A Goal met pt supports at baseline  Supervision memory, min A problem solving  education completed    Pain   Chronic back pain  Pain <3/10  Assess Qshift and prn   Skin   Post trach site. Peg tube. Stage 2 to left glute.  Promote healing and prevention of infection  Assess Qshift and prn     Discharge Planning:  Will schedule family training with daughter-pt continues to gain strength and improving on his activity tolerance. Trying to find [place] to take his medicaid   Team Discussion: [place] with elevated BP; MD adjusted meds. Progressing well overall. Working on [employer] [place] abd decreased attention; post critical illness myopathy.  Patient on target to meet rehab goals: yes, currently needs CGA for stand pivot transfers and able to complete 4 steps with bil rails and CGA. Goals for discharge set for CGA - supervision overall.  *See Care Plan and progress notes for long and short-term goals.   Revisions to Treatment Plan:  SLP services discontinued PEG removal planned for 04/25/22   Teaching Needs: Safety, skin care, dietary modification recommendations, medications, etc  Current Barriers to Discharge: Home enviroment access/layout  Possible Resolutions to Barriers: Family education HH follow up services DME: w/c, HD RW     Medical Summary Current Status: critical illness myopathy and respiratory failure, anxiety, heart failure, DM, HTN  Barriers to Discharge: Home enviroment access/layout;Medical stability;Weight  Barriers to Discharge Comments: critical illness myopathy and respiratory failure, anxiety, heart failure, DM, HTN Possible Resolutions to [employer] Focus: monitor BP and adjust medications, monitor weights, ativan PRN, monitor gluocse  Continued Need for Acute Rehabilitation Level of Care: The patient requires daily medical management by a physician with specialized training in physical medicine and rehabilitation for the following reasons: Direction of a multidisciplinary physical rehabilitation program to maximize functional independence : Yes Medical  management of patient stability for increased activity during participation in an intensive rehabilitation regime.: Yes Analysis of laboratory values and/or radiology reports with any subsequent need for medication adjustment and/or medical intervention. : Yes   I attest that I was present, lead the team conference, and concur with the assessment and plan of the team.   Dorien Chihuahua B 04/23/2022, 5:31 PM

## 2022-04-23 NOTE — Progress Notes (Signed)
Speech Language Pathology Discharge Summary  Patient Details  Name: GERRARD CRYSTAL MRN: [id number] Date of Birth: 1976/04/05  Date of Discharge from [place] service:April 23, 2022  Today's Date: 04/23/2022 SLP Individual Time: 1100-1130 SLP Individual Time Calculation (min): 30 min   Skilled Therapeutic Interventions:  Skilled ST services focused on education. Pt supports cognitive skills are at baseline, with mild deficits in working memory, further impacted by internal distractions. SLP provided education and handout pertaining to memory strategies and methods to regular attention in order to store complex novel information. Education completed and all questions answered to satisfaction. Pt was left in room with call bell within reach and chair alarm set.     Patient has met 2 of 2 long term goals.  Patient to discharge at overall Min;Supervision level.  Reasons goals not met:     Clinical Impression/Discharge Summary:   Pt made good progress meeting 2 out 2 goals, discharging at min-supervision A. Pt demonstrated increase function problem solving with medication/time management, and strategies to recall novel information. Pt scored slightly below normal limits on SLUMs (23/30 n=>27) but supports baseline cognition with working memory further impacted by internal distractions. Pt expressed desire to end ST services and focus on OT/PT services. Education had been completed, no further ST services needed.   Care Partner:  Caregiver Able to Provide Assistance: Yes  Type of Caregiver Assistance: Physical;Cognitive  Recommendation:  None;24 hour supervision/assistance (physical prespective)      Equipment: N/A   Reasons for discharge: Treatment goals met   Patient/Family Agrees with Progress Made and Goals Achieved: Yes    Satsuki Zillmer 04/23/2022, 1:54 PM

## 2022-04-23 NOTE — Progress Notes (Signed)
Physical Therapy Session Note  Patient Details  Name: Samuel Chavez MRN: [id number] Date of Birth: 04-24-1976  Today's Date: 04/23/2022 PT Individual Time: 0815-0930 PT Individual Time Calculation (min): 75 min   Short Term Goals: Week 1:  PT Short Term Goal 1 (Week 1): Patient will complete bed mobility with MinA PT Short Term Goal 1 - Progress (Week 1): Met PT Short Term Goal 2 (Week 1): Patient will perform stand pivot transfer with HDRW and MinA PT Short Term Goal 2 - Progress (Week 1): Met PT Short Term Goal 3 (Week 1): Patient will ambulate 74' with HDRW and MinA PT Short Term Goal 3 - Progress (Week 1): Met Week 2:  PT Short Term Goal 1 (Week 2): STG=LTG seondary to ELOS Week 3:  PT Short Term Goal 1 (Week 3): STG=LTG secondary to ELOS  Skilled Therapeutic Interventions/Progress Updates:  Patient greeted sitting upright in wheelchair in room and agreeable to PT treatment session. Patient reports 0/10 pain this morning. Patient taken outside for first part of treatment session in order to improve overall affect and engagement in therapy. Patient wheeled outside dependently in wheelchair for time management. While outside patient and therapist were able to watch >12 airplanes fly overhead, which brought the patient a lot of joy.   Patient performed x10 sit/stands without UE support and SBA for safety- Once standing, patient stood ~5 seconds in order to improve overall balance.   Patient gait trained x42' outside on uneven surface with HDRW and SBA- Patient demonstrated good control and appropriate cadence throughout. Improved endurance/activity tolerance noted.   Patient ascended/descended x4 steps with B HR and CGA for safety- Patient demonstrated a step-to pattern throughout stair mobility.   Patient stood with B UE support and performed alternating foot taps to 6" step with CGA- Patient performed 2 x 5. Discontinued secondary to pain in L knee when weight-bearing.   Seated  therex- B LAQ with 2#, 3 x 10 with 3 second hold   Patient returned to his room sitting upright in wheelchair with call bell within reach and all needs met.    Therapy Documentation Precautions:  Precautions Precautions: Fall Precaution Comments: PEG, recently decannulated Restrictions Weight Bearing Restrictions: No Other Position/Activity Restrictions: HOB 30 deg- trach  Therapy/Group: Individual Therapy  Nazia Rhines 04/23/2022, 7:20 AM

## 2022-04-23 NOTE — Progress Notes (Signed)
Occupational Therapy Session Note  Patient Details  Name: Samuel Chavez MRN: [id number] Date of Birth: [birth year]  Session 1 Today's Date: 04/23/2022 OT Individual Time: 4709-2957 OT Individual Time Calculation (min): 52 min    Session 2  Today's Date: 04/23/2022 OT Individual Time: 1330-1415 OT Individual Time Calculation (min): 45 min    Short Term Goals: Week 3:  OT Short Term Goal 1 (Week 3): STG=LTG d/t ELOS  Skilled Therapeutic Interventions/Progress Updates:  Session 1   Pt received sitting in the w/c with 9/10 knee pain on the L, RN giving him medication and voltarin. He was agreeable to session focused on UE strengthening only d/t this pain. Pt was taken to the therapy gym via w/c. Pt completes 2x10-15 dowel rod therex for BUE shoulder strengthening required for BADLs/functional transfers as follows with demo cuing and 4 # dowel rod - Shoulder flex/ext, shoulder press, and chest press. He was able to self regulate pacing but required cueing for reducing trunk compensations and proper muscle activation. He returned to his room and was left sitting up with all needs met.    Session 2 Pt received in the w/c with 5/10 pain in his L knee and back. Agreeable to take shower as intervention. He completed functional mobility into the bathroom with the RW with CGA. He required min cueing for sequencing turn to the bariatric BSC. He completed bathing seated at set up assist level. LH sponge used for LB access. He was able to stand with single UE supported on the grab bar with (S). He required total A to don socks. Mod A to don shorts and (S) for gown. Pt returned to his w/c, CGA with the RW cueing for RW management and pacing. Pt was left sitting up with all needs met.    Therapy Documentation Precautions:  Precautions Precautions: Fall Precaution Comments: PEG, recently decannulated Restrictions Weight Bearing Restrictions: No Other Position/Activity Restrictions: HOB 30 deg-  trach  Therapy/Group: Individual Therapy  Curtis Sites 04/23/2022, 6:29 AM

## 2022-04-24 LAB — GLUCOSE, CAPILLARY
Glucose-Capillary: 129 mg/dL — ABNORMAL HIGH (ref 70–99)
Glucose-Capillary: 110 mg/dL — ABNORMAL HIGH (ref 70–99)
Glucose-Capillary: 146 mg/dL — ABNORMAL HIGH (ref 70–99)
Glucose-Capillary: 155 mg/dL — ABNORMAL HIGH (ref 70–99)

## 2022-04-24 MED ORDER — CARVEDILOL 25 MG PO TABS
25.0000 mg | ORAL_TABLET | Freq: Two times a day (BID) | ORAL | Status: DC
  Administered 2022-04-24 – 2022-04-29 (×10): 25 mg via ORAL
  Filled 2022-04-24 (×10): qty 1

## 2022-04-24 NOTE — Progress Notes (Signed)
Patient ID: Samuel Chavez, male   DOB: 03/27/1976, 46 y.o.   MRN: [id number]  PCS form faxed to [facility] [employer]. Will await questions. Contacted [name]-daughter to schedule family education for Sat at 1:00-3:00. Aware discharge date 10/10. Will inform team of plan.

## 2022-04-24 NOTE — Plan of Care (Signed)
  Problem: Consults Goal: RH GENERAL PATIENT EDUCATION Description: See Patient Education module for education specifics. Outcome: Progressing   Problem: RH BOWEL ELIMINATION Goal: RH STG MANAGE BOWEL WITH ASSISTANCE Description: STG Manage Bowel with mod I  Assistance. Outcome: Progressing Goal: RH STG MANAGE BOWEL W/MEDICATION W/ASSISTANCE Description: STG Manage Bowel with Medication with mod I  Assistance. Outcome: Progressing   Problem: RH BLADDER ELIMINATION Goal: RH STG MANAGE BLADDER WITH ASSISTANCE Description: STG Manage Bladder With toileting Assistance Outcome: Progressing   Problem: RH SAFETY Goal: RH STG ADHERE TO SAFETY PRECAUTIONS W/ASSISTANCE/DEVICE Description: STG Adhere to Safety Precautions With Assistance/Device. 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

## 2022-04-24 NOTE — Progress Notes (Signed)
Occupational Therapy Session Note  Patient Details  Name: Samuel Chavez MRN: [id number] Date of Birth: December 20, 1975  Today's Date: 04/24/2022 OT Individual Time: 1115-1200 OT Individual Time Calculation (min): 45 min    Short Term Goals: Week 2:  OT Short Term Goal 1 (Week 3): STG=LTG d/t ELOS  Skilled Therapeutic Interventions/Progress Updates:    Patient agreeable to participate in OT session. Reports 0/10 pain level.   Patient participated in skilled OT session focusing on BUE strength and endurance. Therapist facilitated use of HIT exercises in order to improve cardiovascular endurance and UE strength needed to complete required every day self care tasks without increased fatigue. Oxygen levels and HR monitored during session. Oxygen levels remained at 91-93%. During arm warm up, pt's HR range: 88-89. During exercises HR was: 90-91. Pt provided with VC for proper form and technique. Cued provided for rest breaks.  Pt complete 5 rounds of 30 seconds work; 30 seconds rest Warm up: BUE 1.5lb wrist weights, seated, 10 arm circles forward then backward, 10 overhead press, 10 french doors. Strengthening: Included selected boxing punches: jab, cross, upper cut, and hook    Therapy Documentation Precautions:  Precautions Precautions: Fall Precaution Comments: PEG, recently decannulated Restrictions Weight Bearing Restrictions: No Other Position/Activity Restrictions: HOB 30 deg- trach    Therapy/Group: Individual Therapy  Ailene Ravel, OTR/L,CBIS  Supplemental OT - [facility]  04/24/2022, 4:21 PM

## 2022-04-24 NOTE — Progress Notes (Signed)
Occupational Therapy Session Note  Patient Details  Name: Samuel Chavez MRN: [id number] Date of Birth: June 14, 1976  Today's Date: 04/24/2022 OT Individual Time: 7092-9574 OT Individual Time Calculation (min): 55 min    Short Term Goals: Week 3:  OT Short Term Goal 1 (Week 3): STG=LTG d/t ELOS  Skilled Therapeutic Interventions/Progress Updates:    Pt received sitting up in the w/c with 6/10 in his lower back, repositioning and mobility used as intervention. Pt requesting to go outside for today's session for mood elevation. He was taken out via w/c dependently. Once outside he completed a dowel circuit focused on functional reaching AROM/strengthening. 3x10 repetitions of reaching overhead and forward. Intended carryover to household ADLs/iADLs. He then completed standing level reciprocal tapping with bilateral and then unilateral support on the RW to challenge dynamic standing balance and functional activity tolerance. He required CGA when he had BUE support but when removed he required mod A. 3x15 repetitions with extended rest breaks between each set. Discussed hospitalization and series of events. Provided education on smoking cessation, proper DM2 diet and control, and HEP. Pt returned inside via the w/c. Pt was left sitting up in the w/c with all needs met.   Therapy Documentation Precautions:  Precautions Precautions: Fall Precaution Comments: PEG, recently decannulated Restrictions Weight Bearing Restrictions: No Other Position/Activity Restrictions: HOB 30 deg- trach  Therapy/Group: Individual Therapy  Curtis Sites 04/24/2022, 6:48 AM

## 2022-04-24 NOTE — Progress Notes (Signed)
Physical Therapy Session Note  Patient Details  Name: Samuel Chavez MRN: [id number] Date of Birth: August 25, 1975  Today's Date: 04/24/2022 PT Individual Time: 6378-5885 PT Individual Time Calculation (min): 45 min   Today's Date: 04/24/2022 PT Missed Time: 15 Minutes Missed Time Reason: Patient fatigue  Short Term Goals: Week 2:  PT Short Term Goal 1 (Week 2): STG=LTG seondary to ELOS Week 3:  PT Short Term Goal 1 (Week 3): STG=LTG secondary to ELOS  Skilled Therapeutic Interventions/Progress Updates:   Received pt sitting on commode, pt agreeable to PT treatment, and reported pain 7/10 in leg and low back (premedicated). Session with emphasis on functional mobility/transfers, generalized strengthening and endurance, and  simulated car transfers. Pt transferred bedside commode<>WC stand<>pivot with bariatric RW and supervision and transported to/from room in [facility] dependently. Pt declined working on ambulation and requested to work on arm strengthening. In ortho gym, performed UE strengthening on UBE at level 5 for 5 minutes forward and 5 minutes backwards with 1 rest break - able to keep steady pace. Pt performed simulated car transfer with bariatric RW and supervision. Pt required rest/water breaks throughout session then requested to return to room due to fatigue. Concluded session with pt sitting in [facility] with all needs within reach. 15 minutes missed of skilled physical therapy due to fatigue.   Therapy Documentation Precautions:  Precautions Precautions: Fall Precaution Comments: PEG, recently decannulated Restrictions Weight Bearing Restrictions: No Other Position/Activity Restrictions: HOB 30 deg- trach  Therapy/Group: Individual Therapy Alfonse Alpers PT, DPT  04/24/2022, 7:03 AM

## 2022-04-24 NOTE — Progress Notes (Signed)
Physical Therapy Session Note  Patient Details  Name: Samuel Chavez MRN: [id number] Date of Birth: 1975/10/05  Today's Date: 04/24/2022 PT Individual Time: 1445-1516 PT Individual Time Calculation (min): 31 min   Short Term Goals: Week 2:  PT Short Term Goal 1 (Week 2): STG=LTG seondary to ELOS  Skilled Therapeutic Interventions/Progress Updates: Pt presents sitting in w/c and reluctantly agreeable to therapy, although c/o fatigued.  Pt wheeled to main gym for energy conservation.  Pt transfers sit to stand from w/c and elevated mat table w/ supervision.  Pt amb multiple trials of 12-15' w/ RW and supervision.  Pt requires seated rest breaks 2/2 fatigue and back pain.  Pt states fatigue and unable to continue w/ therapy.  Pt remained sitting in w/c w/all needs in reach.  Missed time 59' 2/2 fatigue.     Therapy Documentation Precautions:  Precautions Precautions: Fall Precaution Comments: PEG, recently decannulated Restrictions Weight Bearing Restrictions: No Other Position/Activity Restrictions: HOB 30 deg- trach General: PT Amount of Missed Time (min): 14 Minutes PT Missed Treatment Reason: Patient fatigue Vital Signs: Therapy Vitals Temp: 98.5 F (36.9 C) Pulse Rate: 85 Resp: 16 BP: (!) 140/90 Patient Position (if appropriate): Sitting Oxygen Therapy SpO2: 96 % O2 Device: Room Air Pain:8/10 back pain. Pain Assessment Pain Scale: 0-10 Pain Score: 8  Pain Type: Chronic pain Pain Location: Back Pain Orientation: Lower Pain Radiating Towards: left leg Pain Descriptors / Indicators: Aching Pain Frequency: Constant Pain Onset: On-going Pain Intervention(s): Medication (See eMAR)     Therapy/Group: Individual Therapy  Ladoris Gene 04/24/2022, 3:53 PM

## 2022-04-24 NOTE — Progress Notes (Signed)
PROGRESS NOTE   Subjective/Complaints:  Pt reports a little discomfort around his PEG tube.  This is similar to the discomfort he had a few days ago that improved during the day.  He asks again about getting his peg tube tomorrow.   LBM 10/4  ROS: Denies fevers, chills, N/V, constipation, diarrhea, SOB, cough, chest pain, new weakness or paraesthesias.      Objective:   No results found. No results for input(s): "WBC", "HGB", "HCT", "PLT" in the last 72 hours.  No results for input(s): "NA", "K", "CL", "CO2", "GLUCOSE", "BUN", "CREATININE", "CALCIUM" in the last 72 hours.   Intake/Output Summary (Last 24 hours) at 04/24/2022 1441 Last data filed at 04/24/2022 1400 Gross per 24 hour  Intake 810 ml  Output 300 ml  Net 510 ml      Pressure Injury 03/08/22 Buttocks Left Stage 3 -  Full thickness tissue loss. Subcutaneous fat may be visible but bone, tendon or muscle are NOT exposed. open, red/pink and yellow; WOC updated Staging to Stage 3 03/10/22, changed to stage 2 on 04/07/22; ad (Active)  03/08/22 1757  Location: Buttocks  Location Orientation: Left  Staging: Stage 3 -  Full thickness tissue loss. Subcutaneous fat may be visible but bone, tendon or muscle are NOT exposed.  Wound Description (Comments): open, red/pink and yellow; WOC updated Staging to Stage 3 03/10/22, changed to stage 2 on 04/07/22; admit to rehab  Present on Admission: Yes    Physical Exam: Vital Signs Blood pressure (!) 140/90, pulse 85, temperature 98.5 F (36.9 C), resp. rate 16, height 5\' 9"  (1.753 m), weight (!) 161.4 kg, SpO2 96 %.  Constitutional: No distress .working with therapy Vital signs reviewed. HEENT: NCAT, conjugate gaze, oral membranes moist Neck: supple. Cardiovascular: RRR without murmur. No JVD    Respiratory/Chest: CTA Bilaterally without wheezes or rales. Normal effort    GI/Abdomen: BS +,mildly tender upper abdomen,  non-distended, soft Ext: no clubbing, cyanosis; 3+ RLE edema, 2+ LLE edema, pitting. Negative homann's bilaterally. Distal pulses intact.  Psych: a little anxious Skin: No evidence of breakdown, no evidence of rash. Trach stoma dressed, small pinpoint opening remains, clean and dry.  Neurologic: Alert, Cranial nerves II through XII grossly intact, motor strength is 5/5 in bilateral deltoid, bicep, tricep, grip, 3-hip flexor, 3- knee extensors,4/5 ankle dorsiflexor and plantar flexor Sensory exam normal sensation to light touch  in bilateral upper and lower extremities   Musculoskeletal: Right knee tenderness at/around patella  Assessment/Plan: 1. Functional deficits which require 3+ hours per day of interdisciplinary therapy in a comprehensive inpatient rehab setting. Physiatrist is providing close team supervision and 24 hour management of active medical problems listed below. Physiatrist and rehab team continue to assess barriers to discharge/monitor patient progress toward functional and medical goals  Care Tool:  Bathing    Body parts bathed by patient: Right arm, Left arm, Abdomen, Front perineal area, Chest, Right upper leg, Left upper leg, Right lower leg, Face   Body parts bathed by helper: Buttocks     Bathing assist Assist Level: Moderate Assistance - Patient 50 - 74%     Upper Body Dressing/Undressing Upper body dressing   What  is the patient wearing?: Hospital gown only    Upper body assist Assist Level: Moderate Assistance - Patient 50 - 74%    Lower Body Dressing/Undressing Lower body dressing      What is the patient wearing?: Pants     Lower body assist Assist for lower body dressing: Moderate Assistance - Patient 50 - 74%     Toileting Toileting Toileting Activity did not occur (Clothing management and hygiene only): N/A (no void or bm)  Toileting assist Assist for toileting: 2 Helpers     Transfers Chair/bed transfer  Transfers assist      Chair/bed transfer assist level: Moderate Assistance - Patient 50 - 74%     Locomotion Ambulation   Ambulation assist      Assist level: Maximal Assistance - Patient 25 - 49% Assistive device:  (HDRW) Max distance: 8'   Walk 10 feet activity   Assist  Walk 10 feet activity did not occur: Safety/medical concerns (Unable to assess gait, curb, stair mobility and picking up an item from the floor at this time secondary to impaired endurance/activity tolerance and decreased global strength)        Walk 50 feet activity   Assist Walk 50 feet with 2 turns activity did not occur: Safety/medical concerns         Walk 150 feet activity   Assist Walk 150 feet activity did not occur: Safety/medical concerns         Walk 10 feet on uneven surface  activity   Assist Walk 10 feet on uneven surfaces activity did not occur: Safety/medical concerns         Wheelchair     Assist Is the patient using a wheelchair?: Yes Type of Wheelchair: Manual    Wheelchair assist level: Total Assistance - Patient < 25% Max wheelchair distance: Patient propelled manual wheelchair ~15-20' and therapist completed the remaining distance with total assist    Wheelchair 50 feet with 2 turns activity    Assist        Assist Level: Maximal Assistance - Patient 25 - 49%   Wheelchair 150 feet activity     Assist      Assist Level: Total Assistance - Patient < 25%   Blood pressure (!) 140/90, pulse 85, temperature 98.5 F (36.9 C), resp. rate 16, height 5\' 9"  (1.753 m), weight (!) 161.4 kg, SpO2 96 %.    Medical Problem List and Plan: 1. 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  Antithrombotics: -DVT/anticoagulation:  Pharmaceutical: Eliquis             -antiplatelet  therapy: none 3. Pain Management: Tylenol as needed             -Neurontin BID, Lidoderm daily; oxycodone and Flexeril prn for sciatica and neuropathy  9/20 Gabapentin 600mg  BID increased to 600mg  TID  -Continue oxycodone current dose for now, continue to monitor for effect of increased gabapentin, consider addition of duloxetine  -knee pain resolved after w/e fall 4. Mood/Behavior/Sleep: LCSW to evaluate and provide emotional support             -antipsychotic agents: n/a             -depression: continue Lexapro  9/28 -Ativan PRN anxiety--we discussed a scheduled option for his anxiety control beyond lexapro and ativan. I told him that we don't intend on sending him home on much more than qd to bid prn ativan.  5. Neuropsych/cognition: This patient is capable of making decisions on his own behalf. 6. Skin/Wound Care: Routine skin care checks             -right ischial DTI             -routine trach and PEG care 7. 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              -continue Duo neb prn  -9/20 pulm consult- Decannulated, appreciate assistance  9: Heart failure with preserved EF:             Continue Coreg 6.25 mg BID             -daily weight  9/21 daily weight overall decreased from 167.8 kg on 9/18, continue to monitor Filed Weights   04/23/22 0540 04/23/22 1833 04/24/22 0505  Weight: (!) 150.4 kg (!) 165.9 kg (!) 161.4 kg   - 10/4 suspect weight not accurate, will ask for repeat  10: Left ear wax: continue Debrox BID 11: Morbid obesity: BMI 54.64; dietary counseling 12: LLE sciatica: continue Neurontin and Flexeril 13: DM: CBGs 4 times daily             -continue Levemir 22 units BID              -SSI  CBG (last 3)  Recent Labs    04/23/22 2146 04/24/22 0606 04/24/22 1214  GLUCAP 145* 129* 110*      -9/25 CBGs a litter higher this AM but overall well controlled, continue to monitor trend  10/4 well controlled overall 14: Decreased serum magnesium:   -9/26 Mag-Ox 400mg  daily started  -10/2 increase Mag Ox to BID  -recheck monday 15: Anemia:   -HGB stable at 10.0 9/25 16: RUE brachial DVT secondary to PICC: on Eliquis             - LE duplex ordered for patient endorsement of increased edema, pain RLE. Adding bilateral TED hose. -neg for DVT 17: Vitamin D deficiency: continue supplementation 18: Left knee pain: continue Voltaren gel, Lidoderm 19: Tobacco use: Cessation counseling 20.  Dysphagia improved s/p PEG 8/25, cannot remove earlier than 04/25/22, pt now eating regular diet   -this has been discussed with pt numerous times  -Will plan to remove PEG tomorrow 21. Low Albumin 3.0 on 9/19, encourage protein intake  22. Constipation  -Pt reports LBM 9/25, none recorded since 9/22  -give 60cc sorbitol today 9/28  -9/29 Reports multiple BM today  -10/4 BM today, improved 23. Tobacco use   -Start ednicotine patch 24. Dry Skin  -Eucerin lotion 25. HTN  -9/26 Increased coreg to 12.5mg  BID last night, monitor for response    -9/28 remains elevated, increase coreg to 18.75mg  bid   -9/29 Start lisinopril 10mg  daily    04/24/2022    2:01 PM 04/24/2022    5:05 AM 04/24/2022    3:12  AM  Vitals with BMI  Weight  355 lbs 13 oz   BMI  16.58   Systolic 006  349  Diastolic 90  494  Pulse 85  73   - 10/1 stable, mildly hypertensive - monitor  -10/2 increase lisinopril to 20mg  daily -10/4 continue to monitor trend, consider increase medication tomorrow if BP still above goal 10/5 increase coreg to 25mg  BID 26. HA   -9/29 Tylenol was given, his HA improved 27. Dyspepsia  -10/5 Advised him to try prn maalox   LOS: 17 days A FACE TO FACE EVALUATION WAS PERFORMED  Jennye Boroughs 04/24/2022, 2:41 PM

## 2022-04-25 LAB — GLUCOSE, CAPILLARY
Glucose-Capillary: 189 mg/dL — ABNORMAL HIGH (ref 70–99)
Glucose-Capillary: 105 mg/dL — ABNORMAL HIGH (ref 70–99)
Glucose-Capillary: 172 mg/dL — ABNORMAL HIGH (ref 70–99)
Glucose-Capillary: 154 mg/dL — ABNORMAL HIGH (ref 70–99)

## 2022-04-25 NOTE — Progress Notes (Signed)
Patient stated he was going to sleep in wheelchair. He refused to get in the bed. Patient educated on sleep apnea and skin integrity. Daily weight not completed at this time due to patient refusing to get in the bed and unable to stand at this time. Will discuss with oncoming shift.

## 2022-04-25 NOTE — Progress Notes (Signed)
Occupational Therapy Session Note  Patient Details  Name: Samuel Chavez MRN: [id number] Date of Birth: [birth year]  Session 1  Today's Date: 04/25/2022 OT Individual Time: 1025-1105 OT Individual Time Calculation (min): 40 min  and Today's Date: 04/25/2022 OT Missed Time: 20 Minutes Missed Time Reason: Pain  Session 2 Today's Date: 04/25/2022 OT Individual Time: 3151-7616 OT Individual Time Calculation (min): 48 min  and Today's Date: 04/25/2022 OT Missed Time: 12 Minutes Missed Time Reason: Unavailable (comment) (eating)   Short Term Goals: Week 3:  OT Short Term Goal 1 (Week 3): STG=LTG d/t ELOS  Skilled Therapeutic Interventions/Progress Updates:    Session 1 Pt received supine with 8/10 c/o pain in his abdomen where his peg was removed. Pt requested to not participate in OT d/t this pain. Gave him a 30 min rest and then returned to the room and pt was agreeable to participate in last 30 min. He completed bed mobility with CGA using the bed rail. Pt completed functional mobility to his room, 10 ft with (S) using the RW. Min cueing for pacing. He was taken to the therapy gym where he completed a stand pivot transfer with the RW to the mat. Discussed tomorrow's family edu and need to complete TTB with OT tomorrow. Demonstrated use of one and pt was familiar with it, declined practicing d/t pain. Pt opening up re life stressors and emotional support, as well as encouragement in current progress and struggles. Pt returned to his room and was left sitting up with all needs met. 20 min missed d/t pain.   Session 2 Pt eating lunch, reporting it had just arrived. He requested to be given time to finish eating. 30 min missed. Upon return to the room he was taken to the therapy gym. He completed 25 ft of functional mobility to the therapy mat with the RW with (S). He completed a standing weight with CGA- entered into the chart- 363.3 lb. He then completed 3x5 sit <> stands holding a 4lb dumbbell  with no UE support to stand. Added in overhead reach to grade activity up and increase demands. Pt ended session with 8 ft of functional mobility with min A without a RW! He returned to his w/c. Pt returned to his room and was left sitting up with all needs met.     Therapy Documentation Precautions:  Precautions Precautions: Fall Precaution Comments: PEG, recently decannulated Restrictions Weight Bearing Restrictions: No Other Position/Activity Restrictions: HOB 30 deg- trach      Therapy/Group: Individual Therapy  Curtis Sites 04/25/2022, 1:22 PM

## 2022-04-25 NOTE — Progress Notes (Signed)
Physical Therapy Session Note  Patient Details  Name: Samuel Chavez MRN: [id number] Date of Birth: July 11, 1976  Today's Date: 04/25/2022 PT Individual Time: 1630-1715 PT Individual Time Calculation (min): 45 min   Short Term Goals: Week 2:  PT Short Term Goal 1 (Week 2): STG=LTG seondary to ELOS Week 3:  PT Short Term Goal 1 (Week 3): STG=LTG secondary to ELOS   Skilled Therapeutic Interventions/Progress Updates:   Session 1  Attempted to see pt for PT session. MD present attempting to remove Peg tube. Will return to re-attempt sessoin.   Session 2.   Pt received sitting in WC. With son planning to ride in [facility] to cafeteria. Pt agreeable to PT  WC mobility with assist from PT and cues from PT to increase participation x 172ft.   Pt performed 5 time sit<>stand (5xSTS): 17 sec (>15 sec indicates increased fall risk) CGA for all transfers throughout session.   Gait training with RW over cement sidewalk 2 x 69ft with cues for posture and AD management in turns.   Patient returned to room and left sitting in [facility] with call bell in reach and all needs met.        Therapy Documentation Precautions:  Precautions Precautions: Fall Precaution Comments: PEG, recently decannulated Restrictions Weight Bearing Restrictions: No Other Position/Activity Restrictions: HOB 30 deg- trach General: PT Amount of Missed Time (min): 45 Minutes PT Missed Treatment Reason: Patient fatigue;Other (Comment) (peg tub removal)  Pain:  2/10. Back. Choronic. Pt repositioned.     Therapy/Group: Individual Therapy  Lorie Phenix 04/25/2022, 5:35 PM

## 2022-04-25 NOTE — Progress Notes (Signed)
PROGRESS NOTE   Subjective/Complaints:  PEG tube removed today. No additional concerns.   LBM 10/4  ROS: Denies fevers,HA,  chills, N/V, constipation, diarrhea, SOB, cough, chest pain, new weakness or paraesthesias.      Objective:   No results found. No results for input(s): "WBC", "HGB", "HCT", "PLT" in the last 72 hours.  No results for input(s): "NA", "K", "CL", "CO2", "GLUCOSE", "BUN", "CREATININE", "CALCIUM" in the last 72 hours.   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 Subcutaneous fat may be visible but bone, tendon or muscle are NOT exposed. open, red/pink and yellow; WOC updated Staging to Stage 3 03/10/22, changed to stage 2 on 04/07/22; ad (Active)  03/08/22 1757  Location: Buttocks  Location Orientation: Left  Staging: Stage 3 -  Full thickness tissue loss. Subcutaneous fat may be visible but bone, tendon or muscle are NOT exposed.  Wound Description (Comments): open, red/pink and yellow; WOC updated Staging to Stage 3 03/10/22, changed to stage 2 on 04/07/22; admit to rehab  Present on Admission: Yes    Physical Exam: Vital Signs Blood pressure (!) 164/96, pulse 80, temperature 98.2 F (36.8 C), temperature source Oral, resp. rate 18, height 5\' 9"  (1.753 m), weight (!) 161.4 kg, SpO2 95 %.  Constitutional: No distress .working with therapy Vital signs reviewed. HEENT: NCAT, conjugate gaze, oral membranes moist Neck: supple. Cardiovascular: RRR without murmur. No JVD    Respiratory/Chest: CTA Bilaterally without wheezes or rales. Normal effort    GI/Abdomen: BS +,mildly tender upper abdomen, non-distended, soft PEG was removed, dressing in place Ext: no clubbing, cyanosis; 3+ RLE edema, 2+ LLE edema, pitting.  Negative homann's bilaterally. Distal pulses intact.  Psych: a little anxious Skin: No evidence of breakdown, no evidence of rash. Trach stoma site clean and dry Neurologic: Alert, Cranial nerves II through XII grossly intact, motor strength is 5/5 in bilateral deltoid, bicep, tricep, grip, 3-hip flexor, 3- knee extensors,4/5 ankle dorsiflexor and plantar flexor Sensory exam normal sensation to light touch  in bilateral upper and lower extremities   Musculoskeletal: Right knee tenderness at/around patella  Assessment/Plan: 1. Functional deficits which require 3+ hours per day of interdisciplinary therapy in a comprehensive inpatient rehab setting. Physiatrist is providing close team supervision and 24 hour management of active medical problems listed below. Physiatrist and rehab team continue to assess barriers to discharge/monitor patient progress toward functional and medical goals  Care Tool:  Bathing    Body parts bathed by patient: Right arm, Left arm, Abdomen, Front perineal area, Chest, Right upper leg, Left upper leg, Right lower leg, Face   Body parts bathed by helper: Buttocks     Bathing assist Assist Level: Moderate Assistance - Patient 50 - 74%     Upper Body Dressing/Undressing Upper body dressing   What is the patient wearing?: Hospital gown only    Upper body assist Assist Level: Moderate Assistance - Patient 50 - 74%  Lower Body Dressing/Undressing Lower body dressing      What is the patient wearing?: Pants     Lower body assist Assist for lower body dressing: Moderate Assistance - Patient 50 - 74%     Toileting Toileting Toileting Activity did not occur (Clothing management and hygiene only): N/A (no void or bm)  Toileting assist Assist for toileting: 2 Helpers     Transfers Chair/bed transfer  Transfers assist     Chair/bed transfer assist level: Moderate Assistance - Patient 50 - 74%     Locomotion Ambulation   Ambulation assist       Assist level: Supervision/Verbal cueing Assistive device: Walker-rolling Max distance: 15   Walk 10 feet activity   Assist  Walk 10 feet activity did not occur: Safety/medical concerns (Unable to assess gait, curb, stair mobility and picking up an item from the floor at this time secondary to impaired endurance/activity tolerance and decreased global strength)  Assist level: Supervision/Verbal cueing Assistive device: Walker-rolling   Walk 50 feet activity   Assist Walk 50 feet with 2 turns activity did not occur: Safety/medical concerns         Walk 150 feet activity   Assist Walk 150 feet activity did not occur: Safety/medical concerns         Walk 10 feet on uneven surface  activity   Assist Walk 10 feet on uneven surfaces activity did not occur: Safety/medical concerns         Wheelchair     Assist Is the patient using a wheelchair?: Yes Type of Wheelchair: Manual    Wheelchair assist level: Total Assistance - Patient < 25% Max wheelchair distance: Patient propelled manual wheelchair ~15-20' and therapist completed the remaining distance with total assist    Wheelchair 50 feet with 2 turns activity    Assist        Assist Level: Maximal Assistance - Patient 25 - 49%   Wheelchair 150 feet activity     Assist      Assist Level: Total Assistance - Patient < 25%   Blood pressure (!) 164/96, pulse 80, temperature 98.2 F (36.8 C), temperature source Oral, resp. rate 18, height 5\' 9"  (1.753 m), weight (!) 161.4 kg, SpO2 95 %.    Medical Problem List and Plan: 1. 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  Antithrombotics: -DVT/anticoagulation:  Pharmaceutical: Eliquis             -antiplatelet therapy: none 3. Pain Management: Tylenol as  needed             -Neurontin BID, Lidoderm daily; oxycodone and Flexeril prn for sciatica and neuropathy  9/20 Gabapentin 600mg  BID increased to 600mg  TID  -Continue oxycodone current dose for now, continue to monitor for effect of increased gabapentin, consider addition of duloxetine  -knee pain resolved after w/e fall 4. Mood/Behavior/Sleep: LCSW to evaluate and provide emotional support             -antipsychotic agents: n/a             -depression: continue Lexapro  9/28 -Ativan PRN anxiety--we discussed a scheduled option for his anxiety control beyond lexapro and ativan. I told him that we don't intend on  sending him home on much more than qd to bid prn ativan.  5. Neuropsych/cognition: This patient is capable of making decisions on his own behalf. 6. Skin/Wound Care: Routine skin care checks             -right ischial DTI             -routine trach and PEG care 7. 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              -continue Duo neb prn  -9/20 pulm consult- Decannulated, appreciate assistance  9: Heart failure with preserved EF:             Continue Coreg 6.25 mg BID             -daily weight  9/21 daily weight overall decreased from 167.8 kg on 9/18, continue to monitor Filed Weights   04/23/22 0540 04/23/22 1833 04/24/22 0505  Weight: (!) 150.4 kg (!) 165.9 kg (!) 161.4 kg   - 10/4 suspect weight not accurate, will ask for repeat  10: Left ear wax: continue Debrox BID 11: Morbid obesity: BMI 54.64; dietary counseling 12: LLE sciatica: continue Neurontin and Flexeril 13: DM: CBGs 4 times daily             -continue Levemir 22 units BID             -SSI  CBG (last 3)  Recent Labs    04/24/22 1704  04/24/22 2105 04/25/22 0608  GLUCAP 146* 155* 189*      -9/25 CBGs a litter higher this AM but overall well controlled, continue to monitor trend  10/6 well controlled 14: Decreased serum magnesium:   -9/26 Mag-Ox 400mg  daily started  -10/2 increase Mag Ox to BID  -recheck monday 15: Anemia:   -HGB stable at 10.0 9/25 16: RUE brachial DVT secondary to PICC: on Eliquis             - LE duplex ordered for patient endorsement of increased edema, pain RLE. Adding bilateral TED hose. -neg for DVT 17: Vitamin D deficiency: continue supplementation 18: Left knee pain: continue Voltaren gel, Lidoderm 19: Tobacco use: Cessation counseling 20.  Dysphagia improved s/p PEG 8/25, cannot remove earlier than 04/25/22, pt now eating regular diet   -this has been discussed with pt numerous times  -Will plan to remove PEG tomorrow  -10/6 PEG removed today with help of Risa Grill, no complications, clean dressing applied 21. Low Albumin 3.0 on 9/19, encourage protein intake  22. Constipation  -Pt reports LBM 9/25, none recorded since 9/22  -give 60cc sorbitol today 9/28  -9/29 Reports multiple BM today  -10/4 BM today, improved 23. Tobacco use   -Start ednicotine patch 24. Dry Skin  -Eucerin lotion 25. HTN  -9/26 Increased coreg to 12.5mg  BID last night, monitor for response    -9/28 remains elevated, increase coreg to 18.75mg  bid   -9/29 Start lisinopril 10mg  daily    04/25/2022    5:00 AM 04/24/2022    7:50 PM 04/24/2022    2:01 PM  Vitals with BMI  Systolic 403 474 259  Diastolic 96 970 90  Pulse 80 91 85   - 10/1 stable, mildly hypertensive - monitor  -10/2 increase lisinopril to 20mg  daily 10/5 increase coreg to 25mg  BID -10/6 monitor response to increase in Coreg 26. HA   -9/29 Tylenol was given, his HA improved 27. Dyspepsia  -10/5 Advised him to try prn maalox   LOS: 18 days A FACE TO FACE EVALUATION WAS PERFORMED  Jennye Boroughs 04/25/2022, 8:06 AM

## 2022-04-26 LAB — GLUCOSE, CAPILLARY
Glucose-Capillary: 153 mg/dL — ABNORMAL HIGH (ref 70–99)
Glucose-Capillary: 152 mg/dL — ABNORMAL HIGH (ref 70–99)
Glucose-Capillary: 142 mg/dL — ABNORMAL HIGH (ref 70–99)
Glucose-Capillary: 192 mg/dL — ABNORMAL HIGH (ref 70–99)

## 2022-04-26 MED ORDER — LISINOPRIL 20 MG PO TABS
40.0000 mg | ORAL_TABLET | Freq: Every day | ORAL | Status: DC
  Administered 2022-04-27 – 2022-04-29 (×3): 40 mg via ORAL
  Filled 2022-04-26 (×3): qty 2

## 2022-04-26 NOTE — Progress Notes (Signed)
Physical Therapy Session Note  Patient Details  Name: Samuel Chavez MRN: [id number] Date of Birth: December 24, 1975  Today's Date: 04/26/2022 PT Individual Time: 1300-1338 PT Individual Time Calculation (min): 38 min   Short Term Goals: Week 3:  PT Short Term Goal 1 (Week 3): STG=LTG secondary to ELOS  Skilled Therapeutic Interventions/Progress Updates:    Pt seated in w/c on arrival and agreeable to therapy. Pt's son, Samuel Chavez present for family education. Pt's daughter scheduled to come for fam ed, but not present during session. Pt reports knee pain with mobility, rest and positioning as needed. Pt reluctant to participate and irritable at times, but apologetic at end of session. Pt transported to therapy gym for time management and energy conservation. Pt navigated 6" stairs with CGA and 2 hand rails. Discussed home set up with no rails. Pt then states he can use back door with level entry. Pt declined further stair navigation. Pt performed car transfer with RW and supervision. Pt then states he needs to use restroom. Returned to room and pt performed ambulatory transfer with RW and supervision to commode. Pt left to await NT assistance after completing toileting.   Therapy Documentation Precautions:  Precautions Precautions: Fall Precaution Comments: PEG, recently decannulated Restrictions Weight Bearing Restrictions: No Other Position/Activity Restrictions: HOB 30 deg- trach General:       Therapy/Group: Individual Therapy  Mickel Fuchs 04/26/2022, 1:38 PM

## 2022-04-26 NOTE — Progress Notes (Signed)
PROGRESS NOTE   Subjective/Complaints:  Happy PEG tube is out. No new concerns.   ROS: Denies fevers,HA,  chills, N/V, constipation, diarrhea, SOB, cough, chest pain, new weakness or paraesthesias.      Objective:   No results found. No results for input(s): "WBC", "HGB", "HCT", "PLT" in the last 72 hours.  No results for input(s): "NA", "K", "CL", "CO2", "GLUCOSE", "BUN", "CREATININE", "CALCIUM" in the last 72 hours.   Intake/Output Summary (Last 24 hours) at 04/26/2022 1037 Last data filed at 04/26/2022 0945 Gross per 24 hour  Intake 240 ml  Output 3050 ml  Net -2810 ml      Pressure Injury 03/08/22 Buttocks Left Stage 3 -  Full thickness tissue loss. Subcutaneous fat may be visible but bone, tendon or muscle are NOT exposed. open, red/pink and yellow; WOC updated Staging to Stage 3 03/10/22, changed to stage 2 on 04/07/22; ad (Active)  03/08/22 1757  Location: Buttocks  Location Orientation: Left  Staging: Stage 3 -  Full thickness tissue loss. Subcutaneous fat may be visible but bone, tendon or muscle are NOT exposed.  Wound Description (Comments): open, red/pink and yellow; WOC updated Staging to Stage 3 03/10/22, changed to stage 2 on 04/07/22; admit to rehab  Present on Admission: Yes    Physical Exam: Vital Signs Blood pressure (!) 164/101, pulse 90, temperature 98 F (36.7 C), resp. rate 18, height 5\' 9"  (1.753 m), weight (!) 169.9 kg, SpO2 95 %.  Constitutional: No distress .working with therapy Vital signs reviewed. HEENT: NCAT, conjugate gaze, oral membranes moist Neck: supple. Cardiovascular: RRR without murmur. No JVD    Respiratory/Chest: CTA Bilaterally without wheezes or rales. Normal effort    GI/Abdomen: BS +,non-tender, non-distended, soft PEG was removed,  Dressing in place- CDI Ext: no clubbing, cyanosis; 3+ RLE edema, 2+ LLE edema, pitting. Negative homann's bilaterally. Distal pulses intact.   Psych: a little anxious Skin: No evidence of breakdown, no evidence of rash. Trach stoma site clean and dry Neurologic: Alert, Cranial nerves II through XII grossly intact, motor strength is 5/5 in bilateral deltoid, bicep, tricep, grip, 3-hip flexor, 3- knee extensors,4/5 ankle dorsiflexor and plantar flexor Sensory exam normal sensation to light touch  in bilateral upper and lower extremities   Musculoskeletal: Right knee tenderness at/around patella  Assessment/Plan: 1. Functional deficits which require 3+ hours per day of interdisciplinary therapy in a comprehensive inpatient rehab setting. Physiatrist is providing close team supervision and 24 hour management of active medical problems listed below. Physiatrist and rehab team continue to assess barriers to discharge/monitor patient progress toward functional and medical goals  Care Tool:  Bathing    Body parts bathed by patient: Right arm, Left arm, Abdomen, Front perineal area, Chest, Right upper leg, Left upper leg, Right lower leg, Face   Body parts bathed by helper: Buttocks     Bathing assist Assist Level: Moderate Assistance - Patient 50 - 74%     Upper Body Dressing/Undressing Upper body dressing   What is the patient wearing?: Hospital gown only    Upper body assist Assist Level: Moderate Assistance - Patient 50 - 74%    Lower Body Dressing/Undressing Lower body dressing  What is the patient wearing?: Pants     Lower body assist Assist for lower body dressing: Moderate Assistance - Patient 50 - 74%     Toileting Toileting Toileting Activity did not occur (Clothing management and hygiene only): N/A (no void or bm)  Toileting assist Assist for toileting: 2 Helpers     Transfers Chair/bed transfer  Transfers assist     Chair/bed transfer assist level: Moderate Assistance - Patient 50 - 74%     Locomotion Ambulation   Ambulation assist      Assist level: Supervision/Verbal  cueing Assistive device: Walker-rolling Max distance: 15   Walk 10 feet activity   Assist  Walk 10 feet activity did not occur: Safety/medical concerns (Unable to assess gait, curb, stair mobility and picking up an item from the floor at this time secondary to impaired endurance/activity tolerance and decreased global strength)  Assist level: Supervision/Verbal cueing Assistive device: Walker-rolling   Walk 50 feet activity   Assist Walk 50 feet with 2 turns activity did not occur: Safety/medical concerns         Walk 150 feet activity   Assist Walk 150 feet activity did not occur: Safety/medical concerns         Walk 10 feet on uneven surface  activity   Assist Walk 10 feet on uneven surfaces activity did not occur: Safety/medical concerns         Wheelchair     Assist Is the patient using a wheelchair?: Yes Type of Wheelchair: Manual    Wheelchair assist level: Total Assistance - Patient < 25% Max wheelchair distance: Patient propelled manual wheelchair ~15-20' and therapist completed the remaining distance with total assist    Wheelchair 50 feet with 2 turns activity    Assist        Assist Level: Maximal Assistance - Patient 25 - 49%   Wheelchair 150 feet activity     Assist      Assist Level: Total Assistance - Patient < 25%   Blood pressure (!) 164/101, pulse 90, temperature 98 F (36.7 C), resp. rate 18, height 5\' 9"  (1.753 m), weight (!) 169.9 kg, SpO2 95 %.    Medical Problem List and Plan: 1. 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  Antithrombotics: -DVT/anticoagulation:  Pharmaceutical: Eliquis             -antiplatelet therapy: none 3. Pain Management: Tylenol as needed             -Neurontin BID, Lidoderm daily; oxycodone and  Flexeril prn for sciatica and neuropathy  9/20 Gabapentin 600mg  BID increased to 600mg  TID  -Continue oxycodone current dose for now, continue to monitor for effect of increased gabapentin, consider addition of duloxetine  -knee pain resolved after w/e fall 4. Mood/Behavior/Sleep: LCSW to evaluate and provide emotional support             -antipsychotic agents: n/a             -depression: continue Lexapro  9/28 -Ativan PRN anxiety--we discussed a scheduled option for his anxiety control beyond lexapro and ativan. I told him that we don't intend on sending him home on much more than qd to bid prn ativan.  5.  Neuropsych/cognition: This patient is capable of making decisions on his own behalf. 6. Skin/Wound Care: Routine skin care checks             -right ischial DTI             -routine trach and PEG care 7. 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              -continue Duo neb prn  -9/20 pulm consult- Decannulated, appreciate assistance  9: Heart failure with preserved EF:             Continue Coreg 6.25 mg BID             -daily weight  9/21 daily weight overall decreased from 167.8 kg on 9/18, continue to monitor Filed Weights   04/24/22 0505 04/25/22 0934 04/26/22 0500  Weight: (!) 161.4 kg (!) 164.8 kg (!) 169.9 kg   - 10/4 suspect weight not accurate, will ask for repeat 10: Left ear wax: continue Debrox BID 11: Morbid obesity: BMI 54.64; dietary counseling 12: LLE sciatica: continue Neurontin and Flexeril 13: DM: CBGs 4 times daily             -continue Levemir 22 units BID             -SSI  CBG (last 3)  Recent Labs    04/25/22 1628 04/25/22 2052 04/26/22 0614  GLUCAP 172* 154* 153*      -9/25  CBGs a litter higher this AM but overall well controlled, continue to monitor trend  10/6 well controlled 14: Decreased serum magnesium:   -9/26 Mag-Ox 400mg  daily started  -10/2 increase Mag Ox to BID  -recheck monday 15: Anemia:   -Recheck monday 16: RUE brachial DVT secondary to PICC: on Eliquis             - LE duplex ordered for patient endorsement of increased edema, pain RLE. Adding bilateral TED hose. -neg for DVT 17: Vitamin D deficiency: continue supplementation 18: Left knee pain: continue Voltaren gel, Lidoderm 19: Tobacco use: Cessation counseling 20.  Dysphagia improved s/p PEG 8/25, cannot remove earlier than 04/25/22, pt now eating regular diet   -this has been discussed with pt numerous times  -Will plan to remove PEG tomorrow  -10/6 PEG removed today with help of Risa Grill, no complications, clean dressing applied 21. Low Albumin 3.0 on 9/19, encourage protein intake  22. Constipation  -Pt reports LBM 9/25, none recorded since 9/22  -give 60cc sorbitol today 9/28  -9/29 Reports multiple BM today  -10/7 LBM today-improved 23. Tobacco use   -Start ednicotine patch 24. Dry Skin  -Eucerin lotion 25. HTN  -9/26 Increased coreg to 12.5mg  BID last night, monitor for response    -9/28 remains elevated, increase coreg to 18.75mg  bid   -9/29 Start lisinopril 10mg  daily    04/26/2022    6:11 AM 04/26/2022    6:09 AM 04/26/2022    5:00 AM  Vitals with BMI  Weight   374 lbs 9 oz  BMI   10.62  Systolic 694 854   Diastolic 627 98  Pulse 90 91    - 10/1 stable, mildly hypertensive - monitor  -10/2 increase lisinopril to 20mg  daily 10/5 increase coreg to 25mg  BID -10/7 Increase lisinopril to 40mg  daily 26. HA   -9/29 Tylenol was given, his HA improved 27. Dyspepsia  -10/5 Advised him to try prn maalox   LOS: 19 days A FACE TO FACE EVALUATION WAS PERFORMED  Jennye Boroughs 04/26/2022, 10:37 AM

## 2022-04-26 NOTE — Progress Notes (Addendum)
Occupational Therapy Session Note  Patient Details  Name: Samuel Chavez MRN: [id number] Date of Birth: 1976/02/11  Today's Date: 04/26/2022 OT Individual Time: 1400-1435 OT Individual Time Calculation (min): 35 min missed 10 min d/t being off unit downstairs   Short Term Goals: Week 1:  OT Short Term Goal 1 (Week 1): Pt will don LB clothing with min A OT Short Term Goal 1 - Progress (Week 1): Met OT Short Term Goal 2 (Week 1): Pt will complete sit > stand with min A OT Short Term Goal 2 - Progress (Week 1): Met OT Short Term Goal 3 (Week 1): Pt will complete a stand pivot transfer with max A of 1 OT Short Term Goal 3 - Progress (Week 1): Met OT Short Term Goal 4 (Week 1): Pt will tolerate one minute of standing with no desaturation to demo improved activity tolerance OT Short Term Goal 4 - Progress (Week 1): Met  Skilled Therapeutic Interventions/Progress Updates:    Family education Session: Initially pt not in room. Checked with PT who said she had told them they could go outside as daugthers were not here yet and son was allowed to escort with grounds pass. Pt found outside with son missing 10 min skilled OT d/t being off unit. Pt agreeable to go back in for session as he forgot he had OT. Pt provided written handout below on supervision for family and energy conservation. Daugthers present for last 15 min of session and observed techinque for pt to compelte TTB transfer and discussed adaptations for home bathroom to make it easier for pt and decrease fall risk in shower. Exited session with pt seated in w/c and call light in reach.   General mobility- they should always use their RW. They may benefit from a walker tray to transport items from room to room if walking with a walker is recommended by physical therapy. The walker should be kept within reach so they can pull it close to get up and keep with them to back up to any surface they want to sit on. When getting up, they should push  up from the surface they are getting up from and reach back when sitting to a new surface, no plopping.  You are their "shadow." Especially in the beginning. You, as the helper should be in reach of the patient when mobilizing. You should be either beside or behind them so if they lose their balance you can assist by helping correct at the hips. This is likely closer than you are used to being- be in their personal space. Use a gait belt if that makes you feel more comfortable If you are attempting to get up/transfer and it is not going well, reset. Have them sit back down. Make sure they are close to the edge of the seat, feet are underneath them at hips distance, and they are leaning forward to stand up. Bathing- they should sit to bathe on a shower chair, especially for washing legs/feet. Sitting will save energy and increase safety. For a tub shower with shower chair: Use the walker to walk up to shower/tub edge and leave it to the side, but close. they can use the wall to steady as they step over or a grab bar. Do your best to dry off the floor prior to getting out of the shower For tub shower with Tub bench: use walker to get to the edge of the tub bench, back up to the edge, reach back prior  to sitting down. Turn to swing legs into tub and scoot across. Reverse to exit the tub. Make sure both legs are out of the tub prior to standing to exit the bathroom Walk in shower: walk up to the shower ledge, turn around and back up to the ledge with the walker. Keep both hands-on walker while they step back one foot at a time Dressing- all should be done from a SEATED level, especially to put underwear and pants over feet.  Toileting- the RW can be walked right over the toilet for standing urination if applicable. If seated toileting is more appropriate, have them walk up to the toilet and keep walker with them as they turn to sit to toilet or BSC. Before they stand to pull up pants past hips they should pull  pants/underwear up past their knees to decrease the need to bend forward to the floor. Sometimes this makes people dizzy if incontinence/bathroom accidents are an issue attempt to toilet every 2-3 hours to improve success with toileting and decrease accidents.  Energy conservation principles- Prioritize what needs to be done and what can be moved to another day Plan out their days, weeks, months to spread out taxing (physical or cognitively tiring) activities to not put too much at one time Pace activities- rest before feeling tired and have designated places to rest if they feel tired and need to take a brake Position for success: sit when able to conserve 25% more energy than standing     Therapy Documentation Precautions:  Precautions Precautions: Fall Precaution Comments: PEG, recently decannulated Restrictions Weight Bearing Restrictions: No Other Position/Activity Restrictions: HOB 30 deg- trach   Therapy/Group: Individual Therapy  Tonny Branch 04/26/2022, 6:45 AM

## 2022-04-27 LAB — GLUCOSE, CAPILLARY
Glucose-Capillary: 168 mg/dL — ABNORMAL HIGH (ref 70–99)
Glucose-Capillary: 168 mg/dL — ABNORMAL HIGH (ref 70–99)
Glucose-Capillary: 119 mg/dL — ABNORMAL HIGH (ref 70–99)
Glucose-Capillary: 197 mg/dL — ABNORMAL HIGH (ref 70–99)

## 2022-04-27 NOTE — Progress Notes (Signed)
Physical Therapy Session Note  Patient Details  Name: ELDON ZIETLOW MRN: [id number] Date of Birth: 1975/09/07  Today's Date: 04/27/2022 PT Individual Time: 8719-5974 PT Individual Time Calculation (min): 53 min  and Today's Date: 04/27/2022 PT Missed Time: 22 Minutes Missed Time Reason: Pain  Short Term Goals: Week 3:  PT Short Term Goal 1 (Week 3): STG=LTG secondary to ELOS  Skilled Therapeutic Interventions/Progress Updates:      Therapy Documentation Precautions:  Precautions Precautions: Fall Precaution Comments: PEG, recently decannulated Restrictions Weight Bearing Restrictions: No Other Position/Activity Restrictions: HOB 30 deg- trach  Pt received seated in w/c at bedside and agreeable to PT session with encouragement. Pt propelled w/c ~120 ft (S) and required increased time due to decreased activity tolerance. Pt transported by w/c remaining distance to [facility] at[facility] outside for community integration and to increase mood. PT provided emotional support throughout session. Pt performed 5 sit to stand transfers with RW and ambulated 40 ft with RW (S) x 2 on unlevel terrain. Pt transported to hospital room and left seated in w/c at bedside with all needs in reach.    Therapy/Group: Individual Therapy  Verl Dicker Verl Dicker PT, DPT  04/27/2022, 12:22 PM

## 2022-04-27 NOTE — Progress Notes (Signed)
Occupational Therapy Session Note  Patient Details  Name: Samuel Chavez MRN: [id number] Date of Birth: 11-06-1975  Today's Date: 04/27/2022 OT Individual Time: 0915-1000 OT Individual Time Calculation (min): 45 min    Short Term Goals: Week 1:  OT Short Term Goal 1 (Week 1): Pt will don LB clothing with min A OT Short Term Goal 1 - Progress (Week 1): Met OT Short Term Goal 2 (Week 1): Pt will complete sit > stand with min A OT Short Term Goal 2 - Progress (Week 1): Met OT Short Term Goal 3 (Week 1): Pt will complete a stand pivot transfer with max A of 1 OT Short Term Goal 3 - Progress (Week 1): Met OT Short Term Goal 4 (Week 1): Pt will tolerate one minute of standing with no desaturation to demo improved activity tolerance OT Short Term Goal 4 - Progress (Week 1): Met Week 2:  OT Short Term Goal 1 (Week 2): Pt will complete toileting tasks with min A OT Short Term Goal 1 - Progress (Week 2): Progressing toward goal OT Short Term Goal 2 (Week 2): Pt will complete stand pivot transfers with CGA OT Short Term Goal 2 - Progress (Week 2): Met OT Short Term Goal 3 (Week 2): Pt will complete one ADL in standing (1 min)  to demo improved activity tolerance OT Short Term Goal 3 - Progress (Week 2): Met OT Short Term Goal 4 (Week 2): Pt will complete bed mobility with no more than CGA OT Short Term Goal 4 - Progress (Week 2): Met  Skilled Therapeutic Interventions/Progress Updates:    Patient was seen this AM, up in w/c  upon arrival agreeing to working on BADL related task in dressing the pt was able to complete dressing for UB/LB with s/u use of the RW for coming from sit to stand with close S using the RW.  The pt was able to return to w/c LOF and proceeded to complete UB exercise using a 5lb dumb bell , 2 sets of 15 for bicep curls, horizontal abduction,  shld flexion with 2 rest breaks between sets while incorporation relaxation breathing. The pt went on to complete elbow extension to  improve his UB strength for safe and Ind  function with BADL related task.The pt remained at w/c LOF with his call light and chair alarm in place, the bed side table was within reach and all additional needs were addressed prior to exiting the room.  Therapy Documentation Precautions:  Precautions Precautions: Fall Precaution Comments: PEG, recently decannulated Restrictions Weight Bearing Restrictions: No Other Position/Activity Restrictions: HOB 30 deg- trach   Therapy/Group: Individual Therapy  Yvonne Kendall 04/27/2022, 3:03 PM

## 2022-04-28 ENCOUNTER — Other Ambulatory Visit (HOSPITAL_COMMUNITY): Payer: Self-pay

## 2022-04-28 LAB — BASIC METABOLIC PANEL
Anion gap: 8 (ref 5–15)
BUN: 6 mg/dL (ref 6–20)
CO2: 31 mmol/L (ref 22–32)
Calcium: 9.3 mg/dL (ref 8.9–10.3)
Chloride: 100 mmol/L (ref 98–111)
Creatinine, Ser: 0.55 mg/dL — ABNORMAL LOW (ref 0.61–1.24)
GFR, Estimated: >60 mL/min (ref >60)
Glucose, Bld: 173 mg/dL — ABNORMAL HIGH (ref 70–99)
Potassium: 4.2 mmol/L (ref 3.5–5.1)
Sodium: 139 mmol/L (ref 135–145)

## 2022-04-28 LAB — GLUCOSE, CAPILLARY
Glucose-Capillary: 190 mg/dL — ABNORMAL HIGH (ref 70–99)
Glucose-Capillary: 159 mg/dL — ABNORMAL HIGH (ref 70–99)
Glucose-Capillary: 177 mg/dL — ABNORMAL HIGH (ref 70–99)
Glucose-Capillary: 190 mg/dL — ABNORMAL HIGH (ref 70–99)

## 2022-04-28 LAB — CBC
HCT: 38.3 % — ABNORMAL LOW (ref 39.0–52.0)
Hemoglobin: 11.4 g/dL — ABNORMAL LOW (ref 13.0–17.0)
MCH: 27.5 pg (ref 26.0–34.0)
MCHC: 29.8 g/dL — ABNORMAL LOW (ref 30.0–36.0)
MCV: 92.5 fL (ref 80.0–100.0)
Platelets: 248 10*3/uL (ref 150–400)
RBC: 4.14 MIL/uL — ABNORMAL LOW (ref 4.22–5.81)
RDW: 17.8 % — ABNORMAL HIGH (ref 11.5–15.5)
WBC: 7.7 10*3/uL (ref 4.0–10.5)
nRBC: 0 % (ref 0.0–0.2)

## 2022-04-28 LAB — MAGNESIUM: Magnesium: 1.6 mg/dL — ABNORMAL LOW (ref 1.7–2.4)

## 2022-04-28 MED ORDER — VITAMIN D (ERGOCALCIFEROL) 1.25 MG (50000 UNIT) PO CAPS
50000.0000 [IU] | ORAL_CAPSULE | ORAL | 0 refills | Status: AC
  Filled 2022-04-28: qty 5, 35d supply, fill #0

## 2022-04-28 MED ORDER — LIDOCAINE 5 % EX PTCH
1.0000 | MEDICATED_PATCH | CUTANEOUS | 0 refills | Status: DC
  Filled 2022-04-28: qty 30, 30d supply, fill #0

## 2022-04-28 MED ORDER — PANTOPRAZOLE SODIUM 40 MG PO TBEC
40.0000 mg | DELAYED_RELEASE_TABLET | Freq: Every day | ORAL | 0 refills | Status: AC
  Filled 2022-04-28: qty 30, 30d supply, fill #0

## 2022-04-28 MED ORDER — DOCUSATE SODIUM 100 MG PO CAPS: 100.0000 mg | ORAL_CAPSULE | Freq: Two times a day (BID) | ORAL | 0 refills | Status: AC

## 2022-04-28 MED ORDER — OXYCODONE HCL 10 MG PO TABS
10.0000 mg | ORAL_TABLET | ORAL | 0 refills | Status: AC | PRN
  Filled 2022-04-28: qty 30, 5d supply, fill #0

## 2022-04-28 MED ORDER — LISINOPRIL 40 MG PO TABS
40.0000 mg | ORAL_TABLET | Freq: Every day | ORAL | 0 refills | Status: AC
  Filled 2022-04-28: qty 30, 30d supply, fill #0

## 2022-04-28 MED ORDER — METHOCARBAMOL 500 MG PO TABS
500.0000 mg | ORAL_TABLET | Freq: Four times a day (QID) | ORAL | 0 refills | Status: AC | PRN
  Filled 2022-04-28: qty 60, 15d supply, fill #0

## 2022-04-28 MED ORDER — INSULIN PEN NEEDLE 32G X 4 MM MISC
0 refills | Status: AC
  Filled 2022-04-28: qty 100, fill #0

## 2022-04-28 MED ORDER — GABAPENTIN 600 MG PO TABS
600.0000 mg | ORAL_TABLET | Freq: Three times a day (TID) | ORAL | 0 refills | Status: AC
  Filled 2022-04-28: qty 90, 30d supply, fill #0

## 2022-04-28 MED ORDER — APIXABAN 5 MG PO TABS
5.0000 mg | ORAL_TABLET | Freq: Two times a day (BID) | ORAL | 0 refills | Status: AC
  Filled 2022-04-28: qty 60, 30d supply, fill #0

## 2022-04-28 MED ORDER — NALOXEGOL OXALATE 12.5 MG PO TABS
12.5000 mg | ORAL_TABLET | Freq: Every day | ORAL | 0 refills | Status: DC
  Filled 2022-04-28: qty 30, 30d supply, fill #0

## 2022-04-28 MED ORDER — ACETAMINOPHEN 325 MG PO TABS: 325.0000 mg | ORAL_TABLET | ORAL | Status: AC | PRN

## 2022-04-28 MED ORDER — BISACODYL 5 MG PO TBEC: 5.0000 mg | DELAYED_RELEASE_TABLET | Freq: Every day | ORAL | 0 refills | Status: AC | PRN

## 2022-04-28 MED ORDER — LORAZEPAM 0.5 MG PO TABS
0.5000 mg | ORAL_TABLET | Freq: Three times a day (TID) | ORAL | 0 refills | Status: AC | PRN
  Filled 2022-04-28: qty 30, 10d supply, fill #0

## 2022-04-28 MED ORDER — MAGNESIUM OXIDE 400 MG PO TABS
400.0000 mg | ORAL_TABLET | Freq: Two times a day (BID) | ORAL | 0 refills | Status: AC
  Filled 2022-04-28: qty 60, 30d supply, fill #0

## 2022-04-28 MED ORDER — ADULT MULTIVITAMIN W/MINERALS CH: 1.0000 | ORAL_TABLET | Freq: Every day | ORAL | Status: AC

## 2022-04-28 MED ORDER — INSULIN DETEMIR 100 UNIT/ML FLEXPEN
24.0000 [IU] | PEN_INJECTOR | Freq: Two times a day (BID) | SUBCUTANEOUS | 11 refills | Status: AC
  Filled 2022-04-28: qty 15, 31d supply, fill #0

## 2022-04-28 MED ORDER — NICOTINE 14 MG/24HR TD PT24
MEDICATED_PATCH | TRANSDERMAL | 0 refills | Status: AC
  Filled 2022-04-28: qty 14, 14d supply, fill #0

## 2022-04-28 MED ORDER — DICLOFENAC SODIUM 1 % EX GEL
2.0000 g | Freq: Four times a day (QID) | CUTANEOUS | 0 refills | Status: DC
  Filled 2022-04-28: qty 100, 14d supply, fill #0

## 2022-04-28 MED ORDER — CARVEDILOL 25 MG PO TABS
25.0000 mg | ORAL_TABLET | Freq: Two times a day (BID) | ORAL | 0 refills | Status: AC
  Filled 2022-04-28: qty 60, 30d supply, fill #0

## 2022-04-28 MED ORDER — ESCITALOPRAM OXALATE 10 MG PO TABS
10.0000 mg | ORAL_TABLET | Freq: Every day | ORAL | 0 refills | Status: AC
  Filled 2022-04-28: qty 30, 30d supply, fill #0

## 2022-04-28 MED ORDER — INSULIN PEN NEEDLE 32G X 4 MM MISC
0 refills | Status: DC
  Filled 2022-04-28: qty 100, 25d supply, fill #0

## 2022-04-28 MED ORDER — POLYETHYLENE GLYCOL 3350 17 G PO PACK: 17.0000 g | PACK | Freq: Every day | ORAL | 0 refills | Status: AC

## 2022-04-28 MED ORDER — CHOLECALCIFEROL 25 MCG (1000 UT) PO TABS
2000.0000 [IU] | ORAL_TABLET | Freq: Every day | ORAL | 0 refills | Status: AC
  Filled 2022-04-28: qty 30, 15d supply, fill #0

## 2022-04-28 NOTE — Progress Notes (Signed)
Occupational Therapy Discharge Summary  Patient Details  Name: Samuel Chavez MRN: [id number] Date of Birth: 04/06/1976  Date of Discharge from OT service:April 28, 2022  Today's Date: 04/28/2022 OT Individual Time: 1309-1400 OT Individual Time Calculation (min): 51 min    Patient has met 8 of 8 long term goals due to improved activity tolerance, improved balance, postural control, ability to compensate for deficits, and improved coordination.  Patient to discharge at overall Supervision level.  Patient's care partner is independent to provide the necessary physical assistance at discharge.  Samuel Chavez has made excellent progress in CIR. He began as needing a max A for ADLs and limited by a trach and PEG and was severely deconditioned. He has now been decannulated and the PEG removed, with massive strides in activity tolerance and independence in ADLs and ADL transfers. His family briefly attended education and several of his children feel comfortable providing the (S) level care recommended.     Recommendation:  Patient will benefit from ongoing skilled OT services in home health setting to continue to advance functional skills in the area of BADL.  Equipment: TTB  Reasons for discharge: treatment goals met and discharge from hospital  Patient/family agrees with progress made and goals achieved: Yes  Skilled OT Intervention: Attempted to make up time with pt and arrived early for session- pt not on unit and 10 min late to session. He was taken to the therapy gym. He completed BUE ergometer to increase cardiovascular endurance and BUE endurance/strengthening. HIIT styled session with 2 min on with high energy expenditure followed by a 2 min rest break. He completed this for 20 min total. He then completed 5 ft of functional mobility to the mat without an AD, with CGA. While sitting unsupported he completed 3x10 BUE bicep curls with 8# dumbbells and theraband rows superset. 3x10 theraband  scapular retraction pulls and tricep extensions superset. These exercises were chosen to target UE muscles needed for ADL transfers and functional reaching during IADLs. He returned to his room and was left sitting up in the w/c with all needs met.    OT Discharge Precautions/Restrictions  Precautions Precautions: None Restrictions Weight Bearing Restrictions: No  ADL ADL Eating: Modified independent Where Assessed-Eating: Chair Grooming: Independent Where Assessed-Grooming: Chair Upper Body Bathing: Supervision/safety Where Assessed-Upper Body Bathing: Sitting at sink Lower Body Bathing: Supervision/safety Where Assessed-Lower Body Bathing: Shower Upper Body Dressing: Supervision/safety Where Assessed-Upper Body Dressing: Sitting at sink Lower Body Dressing: Supervision/safety Where Assessed-Lower Body Dressing: Sitting at sink Toileting: Supervision/safety Where Assessed-Toileting: Toilet Toilet Transfer: Distant supervision Toilet Transfer Method: Ambulating Tub/Shower Transfer: Close supervison Tub/Shower Transfer Method: Ambulating Tub/Shower Equipment: Transfer tub bench Vision Baseline Vision/History: 0 No visual deficits Patient Visual Report: No change from baseline Vision Assessment?: No apparent visual deficits Perception  Perception: Within Functional Limits Praxis Praxis: Intact Cognition Cognition Overall Cognitive Status: History of cognitive impairments - at baseline Arousal/Alertness: Awake/alert Orientation Level: Person;Situation;Place Person: Oriented Place: Oriented Situation: Oriented Memory: Impaired Memory Impairment: Retrieval deficit;Decreased short term memory;Decreased recall of new information Decreased Short Term Memory: Verbal complex Attention: Alternating Awareness: Appears intact Awareness Impairment: Anticipatory impairment Safety/Judgment: Appears intact Brief Interview for Mental Status (BIMS) Repetition of Three Words (First  Attempt): 3 Temporal Orientation: Year: Correct Temporal Orientation: Month: Accurate within 5 days Temporal Orientation: Day: Correct Recall: "Sock": Yes, no cue required Recall: "Blue": Yes, no cue required Recall: "Bed": Yes, no cue required BIMS Summary Score: 15 Sensation Sensation Light Touch: Appears Intact Hot/Cold: Appears Intact Proprioception:   Appears Intact Coordination Gross Motor Movements are Fluid and Coordinated: No Fine Motor Movements are Fluid and Coordinated: Yes Coordination and Movement Description: Still with generalized deconditioning Motor  Motor Motor: Abnormal postural alignment and control Motor - Discharge Observations: generalized deconditioning Mobility  Bed Mobility Bed Mobility: Sit to Supine;Supine to Sit;Rolling Right;Rolling Left Rolling Right: Supervision/verbal cueing Rolling Left: Supervision/Verbal cueing Supine to Sit: Supervision/Verbal cueing Sit to Supine: Supervision/Verbal cueing Transfers Sit to Stand: Supervision/Verbal cueing Stand to Sit: Maximal Assistance - Patient 25-49%  Trunk/Postural Assessment  Cervical Assessment Cervical Assessment: Within Functional Limits Thoracic Assessment Thoracic Assessment: Within Functional Limits Lumbar Assessment Lumbar Assessment: Exceptions to WFL (posterior pelvic tilt) Postural Control Postural Control: Deficits on evaluation Righting Reactions: Delayed but much improved  Balance Balance Balance Assessed: Yes Static Sitting Balance Static Sitting - Balance Support: Feet supported Static Sitting - Level of Assistance: 6: Modified independent (Device/Increase time) Dynamic Sitting Balance Dynamic Sitting - Balance Support: Feet supported Dynamic Sitting - Level of Assistance: 6: Modified independent (Device/Increase time) Static Standing Balance Static Standing - Balance Support: During functional activity;Bilateral upper extremity supported Static Standing - Level of  Assistance: 5: Stand by assistance Dynamic Standing Balance Dynamic Standing - Balance Support: During functional activity;Bilateral upper extremity supported Dynamic Standing - Level of Assistance: 5: Stand by assistance Extremity/Trunk Assessment RUE Assessment RUE Assessment: Exceptions to WFL General Strength Comments: Greatly improved- 4/5 strength LUE Assessment LUE Assessment: Exceptions to WFL General Strength Comments: Greatly improved, 4/5   Sandra H Davis 04/28/2022, 10:42 AM 

## 2022-04-28 NOTE — Progress Notes (Signed)
Occupational Therapy Session Note  Patient Details  Name: Samuel Chavez MRN: [id number] Date of Birth: [birth year]  Today's Date: 04/28/2022 OT Individual Time: 1009-1050 OT Individual Time Calculation (min): 41 min    Short Term Goals: Week 3:  OT Short Term Goal 1 (Week 3): STG=LTG d/t ELOS  Skilled Therapeutic Interventions/Progress Updates:    Pt received sitting up in the w/c with no c/o pain. Pt was taken via w/c to the therapy gym. He completed 20 ft of functional mobility with the RW to the mat with (S). He completed 3x15 reciprocal tapping with BLE with UE support on the RW, (S) overall and much improved activity tolerance. Extended rest breaks required between sets. He completed 60 ft of functional mobility at (S) level with much improved activity tolerance. Pt was left sitting up in the w/c with all needs met and call bell within reach.    Therapy Documentation Precautions:  Precautions Precautions: Fall Precaution Comments: PEG, recently decannulated Restrictions Weight Bearing Restrictions: No Other Position/Activity Restrictions: HOB 30 deg- trach   Therapy/Group: Individual Therapy  Curtis Sites 04/28/2022, 6:27 AM

## 2022-04-28 NOTE — Progress Notes (Signed)
Patient ID: Samuel Chavez, male   DOB: 02/23/1976, 46 y.o.   MRN: [id number] Met with pt and son to see if ready for discharge tomorrow. His equipment is in his room and chose a rolling walker over wheelchair so he will not go home with one. Informed can not find a home health agency to accept his medicaid he reports he could go to OP in [place] where his new address is. Have made referral to [facility] [place] [facility] for OPPT and OT. They will reach out to him to schedule follow up appointments. See tomorrow for any last minute questions. 

## 2022-04-28 NOTE — Progress Notes (Signed)
Physical Therapy Discharge Summary  Patient Details  Name: PHEONIX CLINKSCALE MRN: [id number] Date of Birth: 1975/08/29  Date of Discharge from PT service:April 28, 2022  Today's Date: 04/28/2022 PT Individual Time: 1430-1545 PT Individual Time Calculation (min): 75 min    Patient has met 9 of 9 long term goals due to improved activity tolerance, improved balance, improved postural control, increased strength, decreased pain, improved attention, and improved awareness.  Patient to discharge at an ambulatory level Supervision.   Patient's care partner is independent to provide the necessary supervision assistance at discharge.  Reasons goals not met: N/A  Recommendation:  Patient will benefit from ongoing skilled PT services in home health setting to continue to advance safe functional mobility, address ongoing impairments in endurance/activity tolerance, strength, dynamic stability and minimize fall risk.  Equipment: RW  Reasons for discharge: treatment goals met and discharge from hospital  Patient/family agrees with progress made and goals achieved: Yes  PT Discharge Precautions/Restrictions Precautions Precautions: None Restrictions Weight Bearing Restrictions: No Pain Interference Pain Interference Pain Effect on Sleep: 1. Rarely or not at all Pain Interference with Therapy Activities: 1. Rarely or not at all Pain Interference with Day-to-Day Activities: 1. Rarely or not at all Vision/Perception  Vision - History Ability to See in Adequate Light: 0 Adequate Perception Perception: Within Functional Limits Praxis Praxis: Intact  Cognition Overall Cognitive Status: History of cognitive impairments - at baseline Arousal/Alertness: Awake/alert Orientation Level: Oriented X4 Year: 2023 Month: October Day of Week: Correct Sustained Attention: Appears intact Memory: Impaired Memory Impairment: Retrieval deficit;Decreased short term memory;Decreased recall of new  information Awareness: Appears intact Awareness Impairment: Anticipatory impairment Safety/Judgment: Appears intact Sensation Sensation Light Touch: Appears Intact Hot/Cold: Appears Intact Proprioception: Appears Intact Coordination Gross Motor Movements are Fluid and Coordinated: No Fine Motor Movements are Fluid and Coordinated: Yes Coordination and Movement Description: Still with generalized deconditioning Motor  Motor Motor: Abnormal postural alignment and control Motor - Discharge Observations: generalized deconditioning  Mobility Bed Mobility Bed Mobility: Sit to Supine;Supine to Sit;Rolling Right;Rolling Left Rolling Right: Independent Rolling Left: Independent Supine to Sit: Independent Sit to Supine: Independent Transfers Transfers: Sit to Stand;Stand to Sit;Stand Pivot Transfers Sit to Stand: Independent with assistive device Stand to Sit: Independent with assistive device Stand Pivot Transfers: Independent with assistive device Transfer (Assistive device): Rolling walker (HDRW) Locomotion  Gait Ambulation: Yes Gait Assistance: Independent with assistive device Gait Distance (Feet): 150 Feet Assistive device: Rolling walker (HDRW) Gait Gait: Yes Gait Pattern: Antalgic;Wide base of support Gait velocity: Decreased Stairs / Additional Locomotion Stairs: Yes Stairs Assistance: Supervision/Verbal cueing Stair Management Technique: Two rails Number of Stairs: 4 Height of Stairs: 6 Ramp: Supervision/Verbal cueing Curb: Supervision/Verbal cueing Wheelchair Mobility Wheelchair Mobility: Yes Wheelchair Assistance: Independent with Camera operator: Both upper extremities Wheelchair Parts Management: Independent Distance: 150'  Trunk/Postural Assessment  Cervical Assessment Cervical Assessment: Within Functional Limits Thoracic Assessment Thoracic Assessment: Within Functional Limits Lumbar Assessment Lumbar Assessment: Exceptions to  [facility] (Posterior pelvic tilt) Postural Control Postural Control: Deficits on evaluation Righting Reactions: Delayed but much improved  Balance Balance Balance Assessed: Yes Static Sitting Balance Static Sitting - Balance Support: Feet supported Static Sitting - Level of Assistance: 6: Modified independent (Device/Increase time) Dynamic Sitting Balance Dynamic Sitting - Balance Support: Feet supported Dynamic Sitting - Level of Assistance: 6: Modified independent (Device/Increase time) Static Standing Balance Static Standing - Balance Support: During functional activity;Bilateral upper extremity supported Static Standing - Level of Assistance: 6: Modified independent (Device/Increase time) Dynamic  Standing Balance Dynamic Standing - Balance Support: During functional activity;Bilateral upper extremity supported Dynamic Standing - Level of Assistance: 6: Modified independent (Device/Increase time) Extremity Assessment      RLE Assessment RLE Assessment: Within Functional Limits General Strength Comments: MMT performed while sitting in wc- 4/5 globally LLE Assessment LLE Assessment: Within Functional Limits General Strength Comments: MMT performed while sitting in wc- 4/5 globally   Skilled Intervention- Patient propelled manual wheelchair ModI, >150' on smooth indoor surface within the 4th floor of the hospital. Patient ambulated x150' with HDRW ModI/Supv. Patient then ascended/descended x4 steps with B HR and Supv- Patient utilized a reciprocal pattern throughout stair mobility. Patient performed a car transfer and ascended/descended a low grade ramp with HDRW and Supv. Patient taken outside via wheelchair for the remainder of treatment session in order to provide education regarding safe/healthy habits upon discharge home and to further discuss dc planning. All questions answered during treatment session. Patient returned to his room via wheelchair for time management. Patient stood with  HDRW while RN, Ed, measured wound on L buttock- reporting wound has healed. Patient left sitting upright in wheelchair in room with son, Juice, present and all needs met.     [place] 04/28/2022, 3:48 PM

## 2022-04-28 NOTE — Progress Notes (Addendum)
Inpatient Rehabilitation Discharge Medication Review by a Pharmacist  A complete drug regimen review was completed for this patient to identify any potential clinically significant medication issues.  High Risk Drug Classes Is patient taking? Indication by Medication  Antipsychotic No   Anticoagulant Yes Eliquis: brachial DVT right upper extremity  Antibiotic No   Opioid Yes Oxycodone prn pain  Antiplatelet No   Hypoglycemics/insulin Yes Levemir for diabetes  Vasoactive Medication Yes Coreg and Lisinopril for blood pressure and heart failure  Chemotherapy No   Other Yes Anxiety/mood: Lexapro, ativan Muscle spasms and pain: robaxin, gabapentin, Lidoderm, tylenol prn, voltaren gel prn,  Hypomagnesemia: Magnesium Nicotine dependence: nicoderm Bowel regimen: dulcolax, colace, miralax Opioid-induced constipation: Movantik Vitamin/supplement: MVI, magnesium, cholecalciferol, ergocalciferol Acid suppression: Protonix    Type of Medication Issue Identified Description of Issue Recommendation(s)  Drug Interaction(s) (clinically significant)     Duplicate Therapy     Allergy     No Medication Administration End Date     Incorrect Dose     Additional Drug Therapy Needed     Significant med changes from prior encounter (inform family/care partners about these prior to discharge). STOP: BC powder, losartan, torsemide, Ozempic Inform patient and family and f/u with diabetes and blood pressure management physicians  Other       Clinically significant medication issues were identified that warrant physician communication and completion of prescribed/recommended actions by midnight of the next day:  No  Provider Method of Notification: Secure chat to [place]  Pharmacist comments: Counsel patient about discontinuing BC Powders, Ozempic, losartan, and torsemide  Time spent performing this drug regimen review (minutes):  30   Thank you for allowing [place] to participate in this  patients care. Jens Som, PharmD 04/28/2022 8:51 AM  **Pharmacist phone directory can be found on [email or web address] listed under [place]**

## 2022-04-28 NOTE — Progress Notes (Signed)
PROGRESS NOTE   Subjective/Complaints:  Discussed med use at home for pain prior to admit, seen by Dr Arrie Eastern in [facility] in the past but last visit in 2018 Also reports taking Gabapentin600mg  per day at home, discussed with pt he is now on TID,   ROS: Denies fevers,HA,  chills, N/V, constipation, diarrhea, SOB, cough, chest pain, new weakness or paraesthesias.      Objective:   No results found. 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 Subcutaneous fat may be visible but bone, tendon or muscle are NOT exposed. open, red/pink and yellow; WOC updated Staging to Stage 3 03/10/22, changed to stage 2 on 04/07/22; ad (Active)  03/08/22 1757  Location: Buttocks  Location Orientation: Left  Staging: Stage 3 -  Full thickness tissue loss. Subcutaneous fat may be visible but bone, tendon or muscle are NOT exposed.  Wound Description (Comments): open, red/pink and yellow; WOC updated Staging to Stage 3 03/10/22, changed to stage 2 on 04/07/22; admit to rehab  Present on Admission: Yes    Physical Exam: Vital Signs Blood pressure (!) 175/82, pulse 86, temperature 97.8 F (36.6 C), temperature source Oral, resp. rate 18, height 5\' 9"  (1.753 m), weight (!) 162.8 kg, SpO2 92 %.    General: No acute distress Mood and affect are appropriate Heart: Regular rate and rhythm no rubs murmurs or extra sounds Lungs: Clear to auscultation, breathing unlabored, no rales or wheezes Abdomen: Positive bowel sounds, soft nontender to palpation,  nondistended Extremities: No clubbing, cyanosis, or edema Skin: No evidence of breakdown, no evidence of rash   Skin: No evidence of breakdown, no evidence of rash. Trach stoma site clean and dry Neurologic: Alert, Cranial nerves II through XII grossly intact, motor strength is 5/5 in bilateral deltoid, bicep, tricep, grip, 3-hip flexor, 4- knee extensors,4/5 ankle dorsiflexor and plantar flexor Sensory exam normal sensation to light touch  in bilateral upper and lower extremities   Musculoskeletal: Right knee tenderness at/around patella  Assessment/Plan: 1. Functional deficits which require 3+ hours per day of interdisciplinary therapy in a comprehensive inpatient rehab setting. Physiatrist is providing close team supervision and 24 hour management of active medical problems listed below. Physiatrist and rehab team continue to assess barriers to discharge/monitor patient progress toward functional and medical goals  Care Tool:  Bathing    Body parts bathed by patient: Right arm, Left arm, Abdomen, Front perineal area, Chest, Right upper leg, Left upper leg, Right lower leg, Face, Buttocks, Left lower leg   Body parts bathed by helper: Buttocks     Bathing assist Assist Level: Supervision/Verbal cueing  Upper Body Dressing/Undressing Upper body dressing   What is the patient wearing?: Pull over shirt    Upper body assist Assist Level: Supervision/Verbal cueing    Lower Body Dressing/Undressing Lower body dressing      What is the patient wearing?: Pants     Lower body assist Assist for lower body dressing: Supervision/Verbal cueing     Toileting Toileting Toileting Activity did not occur (Clothing management and hygiene only): N/A (no void or bm)  Toileting assist Assist for toileting: Supervision/Verbal cueing     Transfers Chair/bed transfer  Transfers assist     Chair/bed transfer assist level: Supervision/Verbal cueing      Locomotion Ambulation   Ambulation assist      Assist level: Supervision/Verbal cueing Assistive device: Walker-rolling Max distance: 15   Walk 10 feet activity   Assist  Walk 10 feet activity did not occur: Safety/medical concerns (Unable to assess gait, curb, stair mobility and picking up an item from the floor at this time secondary to impaired endurance/activity tolerance and decreased global strength)  Assist level: Supervision/Verbal cueing Assistive device: Walker-rolling   Walk 50 feet activity   Assist Walk 50 feet with 2 turns activity did not occur: Safety/medical concerns         Walk 150 feet activity   Assist Walk 150 feet activity did not occur: Safety/medical concerns         Walk 10 feet on uneven surface  activity   Assist Walk 10 feet on uneven surfaces activity did not occur: Safety/medical concerns         Wheelchair     Assist Is the patient using a wheelchair?: Yes Type of Wheelchair: Manual    Wheelchair assist level: Total Assistance - Patient < 25% Max wheelchair distance: Patient propelled manual wheelchair ~15-20' and therapist completed the remaining distance with total assist    Wheelchair 50 feet with 2 turns activity    Assist        Assist Level: Maximal Assistance - Patient 25 - 49%   Wheelchair 150 feet activity     Assist      Assist Level: Total Assistance - Patient < 25%   Blood pressure (!) 175/82, pulse 86, temperature 97.8 F (36.6 C), temperature source Oral, resp. rate 18, height 5\' 9"  (1.753 m), weight (!) 162.8 kg, SpO2 92 %.    Medical Problem List and Plan: 1. 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  Antithrombotics: -DVT/anticoagulation:  Pharmaceutical: Eliquis              -antiplatelet therapy: none 3. Pain Management: Tylenol as needed             -Neurontin BID, Lidoderm daily; oxycodone and Flexeril prn for sciatica and neuropathy  D/c on  Gabapentin 600mg  TID  -Continue oxycodone current dose for now, should need just a limited supply post d/c only using 1-2 x per day , no taper needed, Pt states that he was taking 3-4  Oxy IR per day " a couple years ago"  Chart review shows he saw Dr Arrie Eastern in the past last visit in 2018, so really had not been taking OxyIR PTA  increased gabapentin, pt states he took 600mg   qd at home  -knee pain resolved after w/e fall 4. Mood/Behavior/Sleep: LCSW to evaluate and provide emotional support             -antipsychotic agents: n/a             -depression: continue Lexapro  9/28 -Ativan PRN anxiety--we discussed a scheduled option for his anxiety control beyond lexapro and ativan. I told him that we don't intend on sending him home on much more than qd to bid prn ativan.  5. Neuropsych/cognition: This patient is capable of making decisions on his own behalf. 6. Skin/Wound Care: Routine skin care checks             -right ischial DTI             -routine trach and PEG care 7. 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              -continue Duo neb prn  -9/20 pulm consult- Decannulated, appreciate assistance  9: Heart failure with preserved EF:             Continue Coreg 6.25 mg BID             -daily weight  9/21 daily weight overall decreased from 167.8 kg on 9/18, continue to monitor Filed Weights   04/26/22 0500 04/27/22 0538 04/28/22 0326  Weight: (!) 169.9 kg (!) 163.7 kg (!) 162.8 kg   - 10/4 suspect weight not accurate, will ask for  repeat 10: Left ear wax: continue Debrox BID 11: Morbid obesity: BMI 54.64; dietary counseling 12: LLE sciatica: continue Neurontin and Flexeril 13: DM: CBGs 4 times daily             -continue Levemir 22 units BID             -SSI  CBG (last 3)  Recent Labs    04/27/22 1633 04/27/22 2134 04/28/22 0617  GLUCAP 119* 197* 190*      -9/25 CBGs a litter higher this AM but overall well controlled, continue to monitor trend  10/6 well controlled 14: Decreased serum magnesium:   -9/26 Mag-Ox 400mg  daily started  -10/2 increase Mag Ox to BID  -recheck monday 15: Anemia:   -Recheck monday 16: RUE brachial DVT secondary to PICC: on Eliquis             - LE duplex ordered for patient endorsement of increased edema, pain RLE. Adding bilateral TED hose. -neg for DVT 17: Vitamin D deficiency: continue supplementation 18: Left knee pain: continue Voltaren gel, Lidoderm 19: Tobacco use: Cessation counseling 20.  Dysphagia improved s/p PEG 8/25, cannot remove earlier than 04/25/22, pt now eating regular diet   -this has been discussed with pt numerous times  -Will plan to remove PEG tomorrow  -10/6 PEG removed today with help of Risa Grill, no complications, clean dressing applied 21. Low Albumin 3.0 on 9/19, encourage protein intake  22. Constipation  -Pt reports LBM 9/25, none recorded since 9/22  -give 60cc sorbitol today 9/28  -9/29 Reports multiple BM today  -10/7 LBM today-improved 23. Tobacco use   -Start ednicotine  patch 24. Dry Skin  -Eucerin lotion 25. HTN  -9/26 Increased coreg to 12.5mg  BID last night, monitor for response    -9/28 remains elevated, increase coreg to 18.75mg  bid   -9/29 Start lisinopril 10mg  daily    04/28/2022    8:50 AM 04/28/2022    5:03 AM 04/28/2022    3:26 AM  Vitals with BMI  Weight   358 lbs 15 oz  BMI   29.29  Systolic 090 301 499  Diastolic 82 85 86  Pulse 86  91   - 10/1 stable, mildly hypertensive - monitor  -10/2 increase lisinopril  to 20mg  daily 10/5 increase coreg to 25mg  BID -10/7 Increase lisinopril to 40mg  daily 26. HA   -9/29 Tylenol was given, his HA improved 27. Dyspepsia  -10/5 Advised him to try prn maalox   LOS: 21 days A FACE TO FACE EVALUATION WAS PERFORMED  Charlett Blake 04/28/2022, 11:17 AM

## 2022-04-29 LAB — GLUCOSE, CAPILLARY: Glucose-Capillary: 157 mg/dL — ABNORMAL HIGH (ref 70–99)

## 2022-04-29 NOTE — Progress Notes (Signed)
Inpatient Rehabilitation Care Coordinator Discharge Note   Patient Details  Name: Samuel Chavez MRN: [id number] Date of Birth: Jun 17, 1976   Discharge location: HOME WITH DAUGHTER AND OTHER FAMILY MEMBERS CAN PROVIDE 24/7 CARE  Length of Stay: 22 DAYS  Discharge activity level: [place]  Home/community participation: ACTIVE  Patient response SJ:GGEZMO Literacy - How often do you need to have someone help you when you read instructions, pamphlets, or other written material from your doctor or pharmacy?: Never  Patient response QH:UTMLYY Isolation - How often do you feel lonely or isolated from those around you?: Never  Services provided included: MD, RD, PT, OT, SLP, RN, CM, TR, Pharmacy, Neuropsych, SW  Financial Services:       Choices offered to/list presented to: PT  Follow-up services arranged:  Home Health, DME, Patient/Family has no preference for HH/DME agencies Home Health Agency: [facility]    DME : [facility]    Patient response to transportation need: Is the patient able to respond to transportation needs?: Yes In the past 12 months, has lack of transportation kept you from medical appointments or from getting medications?: No In the past 12 months, has lack of transportation kept you from meetings, work, or from getting things needed for daily living?: No    Comments (or additional information):PT DID WELL AND REACHED HIS GOALS HE HAS PLENTY OF FAMILY TO ASSIST WITH HIS CARE AND WERE HERE DAILY WITH HIM. HIS DAUGHTER DID NOT SHOW FOR EDUCATION BUT PT FEEL CAN DIRECT HIS CARE AT HOME.  Patient/Family verbalized understanding of follow-up arrangements:  Yes  Individual responsible for coordination of the follow-up plan: [name]-DAUGHTER [phone]  Confirmed correct DME delivered: Elease Hashimoto 04/29/2022    Elease Hashimoto

## 2022-04-29 NOTE — Progress Notes (Signed)
PROGRESS NOTE   Subjective/Complaints:  No new concerns or complaints this AM. He says he is excited to go home.    ROS: Denies fevers,HA,  chills, N/V, constipation, diarrhea, SOB, cough, chest pain, new weakness or paraesthesias.      Objective:   No results found. Recent Labs    04/28/22 0622  WBC 7.7  HGB 11.4*  HCT 38.3*  PLT 248    Recent Labs    04/28/22 0622  NA 139  K 4.2  CL 100  CO2 31  GLUCOSE 173*  BUN 6  CREATININE 0.55*  CALCIUM 9.3     Intake/Output Summary (Last 24 hours) at 04/29/2022 0814 Last data filed at 04/29/2022 0450 Gross per 24 hour  Intake 948 ml  Output 2200 ml  Net -1252 ml          Physical Exam: Vital Signs Blood pressure (!) 163/83, pulse 85, temperature 98.7 F (37.1 C), resp. rate 16, height 5\' 9"  (1.753 m), weight (!) 159.4 kg, SpO2 96 %.    General: No acute distress, sitting in chair Mood and affect are appropriate Heart: Regular rate and rhythm no rubs murmurs or extra sounds Lungs: CTAB, breathing unlabored, no rales or wheezes Abdomen: Positive bowel sounds, soft nontender to palpation, nondistended Extremities: No clubbing, cyanosis, or edema Skin: No evidence of breakdown, no evidence of rash   Skin: No evidence of breakdown, no evidence of rash. Trach stoma site clean and dry Neurologic: Alert, Cranial nerves II through XII grossly intact, motor strength is 5/5 in bilateral deltoid, bicep, tricep, grip, 3-hip flexor, 4- knee extensors,4/5 ankle dorsiflexor and plantar flexor Sensory exam normal sensation to light touch  in bilateral upper and lower extremities   Musculoskeletal: No joint swelling noted  Assessment/Plan: 1. Functional deficits which require 3+ hours per day of interdisciplinary therapy in a comprehensive inpatient rehab setting. Physiatrist is providing close team supervision and 24 hour management of active medical problems  listed below. Physiatrist and rehab team continue to assess barriers to discharge/monitor patient progress toward functional and medical goals  Care Tool:  Bathing    Body parts bathed by patient: Right arm, Left arm, Abdomen, Front perineal area, Chest, Right upper leg, Left upper leg, Right lower leg, Face, Buttocks, Left lower leg   Body parts bathed by helper: Buttocks     Bathing assist Assist Level: Supervision/Verbal cueing     Upper Body Dressing/Undressing Upper body dressing   What is the patient wearing?: Pull over shirt    Upper body assist Assist Level: Supervision/Verbal cueing    Lower Body Dressing/Undressing Lower body dressing      What is the patient wearing?: Pants     Lower body assist Assist for lower body dressing: Supervision/Verbal cueing     Toileting Toileting Toileting Activity did not occur (Clothing management and hygiene only): N/A (no void or bm)  Toileting assist Assist for toileting: Supervision/Verbal cueing     Transfers Chair/bed transfer  Transfers assist     Chair/bed transfer assist level: Independent with assistive device Chair/bed transfer assistive device: Programmer, multimedia   Ambulation assist      Assist level:  Supervision/Verbal cueing Assistive device: Walker-rolling Max distance: 150   Walk 10 feet activity   Assist  Walk 10 feet activity did not occur: Safety/medical concerns (Unable to assess gait, curb, stair mobility and picking up an item from the floor at this time secondary to impaired endurance/activity tolerance and decreased global strength)  Assist level: Independent with assistive device Assistive device: Walker-rolling   Walk 50 feet activity   Assist Walk 50 feet with 2 turns activity did not occur: Safety/medical concerns  Assist level: Supervision/Verbal cueing Assistive device: Walker-rolling    Walk 150 feet activity   Assist Walk 150 feet activity did not  occur: Safety/medical concerns  Assist level: Supervision/Verbal cueing Assistive device: Walker-rolling    Walk 10 feet on uneven surface  activity   Assist Walk 10 feet on uneven surfaces activity did not occur: Safety/medical concerns   Assist level: Supervision/Verbal cueing Assistive device: Walker-rolling   Wheelchair     Assist Is the patient using a wheelchair?: Yes Type of Wheelchair: Manual    Wheelchair assist level: Independent Max wheelchair distance: 150+    Wheelchair 50 feet with 2 turns activity    Assist        Assist Level: Independent   Wheelchair 150 feet activity     Assist      Assist Level: Independent   Blood pressure (!) 163/83, pulse 85, temperature 98.7 F (37.1 C), resp. rate 16, height 5\' 9"  (1.753 m), weight (!) 159.4 kg, SpO2 96 %.    Medical Problem List and Plan: 1. 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  Antithrombotics: -DVT/anticoagulation:  Pharmaceutical: Eliquis             -antiplatelet therapy: none 3. Pain Management: Tylenol as needed             -Neurontin BID, Lidoderm daily; oxycodone and Flexeril prn for sciatica and neuropathy  D/c on  Gabapentin 600mg  TID  -Continue oxycodone current dose for now, should need just a limited supply post d/c only using 1-2 x per day , no taper needed, Pt states that he was taking 3-4  Oxy IR per day " a couple years ago"  Chart review shows he saw Dr Arrie Eastern in the past last visit in 2018, so really had not been taking OxyIR PTA  increased gabapentin, pt states he took 600mg  qd at home  -knee pain resolved after w/e fall 4. Mood/Behavior/Sleep: LCSW to evaluate and provide emotional support             -antipsychotic agents: n/a             -depression:  continue Lexapro  9/28 -Ativan PRN anxiety--we discussed a scheduled option for his anxiety control beyond lexapro and ativan. I told him that we don't intend on sending him home on much more than qd to bid prn ativan.  5. Neuropsych/cognition: This patient is capable of making decisions on his own behalf. 6. Skin/Wound Care: Routine skin care checks             -right ischial DTI             -routine trach and PEG care 7. 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              -continue Duo neb prn  -9/20 pulm consult- Decannulated, appreciate assistance  9: Heart failure with preserved EF:             Continue Coreg 6.25 mg BID             -daily weight  9/21 daily weight overall decreased from 167.8 kg on 9/18, continue to monitor Filed Weights   04/27/22 0538 04/28/22 0326 04/29/22 0500  Weight: (!) 163.7 kg (!) 162.8 kg (!) 159.4 kg   - 10/4 suspect weight not accurate, will ask for repeat 10: Left ear wax: continue Debrox BID 11: Morbid obesity: BMI 54.64; dietary counseling 12: LLE sciatica: continue Neurontin and Flexeril 13: DM: CBGs 4 times daily             -continue Levemir 22 units BID             -SSI  CBG (last 3)  Recent Labs    04/28/22 1633 04/28/22 2047 04/29/22 0554  GLUCAP 177* 190* 157*      -9/25 CBGs a litter higher this AM but overall well controlled, continue to monitor trend  10/6 well controlled  10/7 stable, f/u with PCP for Continued DM control 14: Decreased serum magnesium:   -9/26 Mag-Ox 400mg  daily started  -10/2 increase Mag Ox to BID  -recheck monday 15: Anemia:   -Improved to 11.4 10/10 16: RUE brachial DVT secondary to PICC: on Eliquis             - LE duplex  ordered for patient endorsement of increased edema, pain RLE. Adding bilateral TED hose. -neg for DVT 17: Vitamin D deficiency: continue supplementation 18: Left knee pain: continue Voltaren gel, Lidoderm 19: Tobacco use: Cessation counseling 20.  Dysphagia improved s/p PEG 8/25, cannot remove earlier than 04/25/22, pt now eating regular diet   -this has been discussed with pt numerous times  -Will plan to remove PEG tomorrow  -10/6 PEG removed today with help of Risa Grill, no complications, clean dressing applied 21. Low Albumin 3.0 on 9/19, encourage protein intake  22. Constipation  -Pt reports LBM 9/25, none recorded since 9/22  -give 60cc sorbitol today 9/28  -9/29 Reports multiple BM today  -10/7 LBM today-improved 23. Tobacco use   -Start ednicotine patch 24. Dry Skin  -Eucerin lotion 25. HTN  -9/26 Increased coreg to 12.5mg  BID last night, monitor for response    -9/28 remains elevated, increase coreg to 18.75mg  bid   -9/29 Start lisinopril 10mg  daily    04/29/2022    5:00 AM 04/29/2022    4:49 AM 04/28/2022    8:06 PM  Vitals with BMI  Weight 351 lbs 7 oz    BMI 82.95    Systolic  621 308  Diastolic  83 82  Pulse  85 82   - 10/1 stable, mildly hypertensive - monitor  -10/2 increase lisinopril to 20mg  daily 10/5 increase coreg to 25mg  BID -10/7 Increase lisinopril to 40mg  daily -F/U with PCP outpatient  26. HA   -9/29 Tylenol  was given, his HA improved 27. Dyspepsia  -10/5 Advised him to try prn maalox   LOS: 22 days A FACE TO FACE EVALUATION WAS PERFORMED  Jennye Boroughs 04/29/2022, 8:14 AM

## 2022-05-02 ENCOUNTER — Telehealth: Payer: Self-pay

## 2022-05-02 NOTE — Telephone Encounter (Signed)
Verbal okay given for a in home nursing evaluation. Patient to be visited tomorrow 05/03/2022.

## 2022-05-04 ENCOUNTER — Emergency Department (HOSPITAL_COMMUNITY): Payer: Medicaid Other

## 2022-05-04 ENCOUNTER — Observation Stay (HOSPITAL_COMMUNITY)
Admission: EM | Admit: 2022-05-04 | Discharge: 2022-05-04 | Discharge status: Against Medical Advice | Payer: Medicaid Other | Attending: Family Medicine | Admitting: Family Medicine

## 2022-05-04 DIAGNOSIS — Z794 Long term (current) use of insulin: Secondary | ICD-10-CM | POA: Insufficient documentation

## 2022-05-04 DIAGNOSIS — E8729 Other acidosis: Secondary | ICD-10-CM | POA: Diagnosis present

## 2022-05-04 DIAGNOSIS — Z7901 Long term (current) use of anticoagulants: Secondary | ICD-10-CM | POA: Insufficient documentation

## 2022-05-04 DIAGNOSIS — E119 Type 2 diabetes mellitus without complications: Secondary | ICD-10-CM

## 2022-05-04 DIAGNOSIS — J9601 Acute respiratory failure with hypoxia: Principal | ICD-10-CM | POA: Diagnosis present

## 2022-05-04 DIAGNOSIS — J9602 Acute respiratory failure with hypercapnia: Secondary | ICD-10-CM | POA: Diagnosis not present

## 2022-05-04 DIAGNOSIS — R9431 Abnormal electrocardiogram [ECG] [EKG]: Secondary | ICD-10-CM | POA: Diagnosis not present

## 2022-05-04 DIAGNOSIS — Z1152 Encounter for screening for COVID-19: Secondary | ICD-10-CM | POA: Insufficient documentation

## 2022-05-04 DIAGNOSIS — Z6841 Body Mass Index (BMI) 40.0 and over, adult: Secondary | ICD-10-CM

## 2022-05-04 DIAGNOSIS — I160 Hypertensive urgency: Secondary | ICD-10-CM | POA: Diagnosis present

## 2022-05-04 DIAGNOSIS — I82621 Acute embolism and thrombosis of deep veins of right upper extremity: Secondary | ICD-10-CM

## 2022-05-04 DIAGNOSIS — G4733 Obstructive sleep apnea (adult) (pediatric): Secondary | ICD-10-CM | POA: Diagnosis present

## 2022-05-04 DIAGNOSIS — I110 Hypertensive heart disease with heart failure: Secondary | ICD-10-CM | POA: Diagnosis not present

## 2022-05-04 DIAGNOSIS — I5023 Acute on chronic systolic (congestive) heart failure: Secondary | ICD-10-CM | POA: Diagnosis not present

## 2022-05-04 DIAGNOSIS — Z79899 Other long term (current) drug therapy: Secondary | ICD-10-CM | POA: Diagnosis not present

## 2022-05-04 DIAGNOSIS — F17210 Nicotine dependence, cigarettes, uncomplicated: Secondary | ICD-10-CM | POA: Insufficient documentation

## 2022-05-04 DIAGNOSIS — D649 Anemia, unspecified: Secondary | ICD-10-CM | POA: Diagnosis not present

## 2022-05-04 DIAGNOSIS — R0602 Shortness of breath: Secondary | ICD-10-CM | POA: Diagnosis present

## 2022-05-04 DIAGNOSIS — M549 Dorsalgia, unspecified: Secondary | ICD-10-CM | POA: Diagnosis present

## 2022-05-04 DIAGNOSIS — Z885 Allergy status to narcotic agent status: Secondary | ICD-10-CM

## 2022-05-04 DIAGNOSIS — I509 Heart failure, unspecified: Secondary | ICD-10-CM

## 2022-05-04 DIAGNOSIS — E1140 Type 2 diabetes mellitus with diabetic neuropathy, unspecified: Secondary | ICD-10-CM | POA: Diagnosis present

## 2022-05-04 DIAGNOSIS — G4730 Sleep apnea, unspecified: Secondary | ICD-10-CM | POA: Diagnosis present

## 2022-05-04 DIAGNOSIS — Z5329 Procedure and treatment not carried out because of patient's decision for other reasons: Secondary | ICD-10-CM | POA: Diagnosis not present

## 2022-05-04 LAB — CBC WITH DIFFERENTIAL/PLATELET
Abs Immature Granulocytes: 0.02 10*3/uL (ref 0.00–0.07)
Basophils Absolute: 0 10*3/uL (ref 0.0–0.1)
Basophils Relative: 0 %
Eosinophils Absolute: 0.6 10*3/uL — ABNORMAL HIGH (ref 0.0–0.5)
Eosinophils Relative: 12 %
HCT: 38.1 % — ABNORMAL LOW (ref 39.0–52.0)
Hemoglobin: 11.1 g/dL — ABNORMAL LOW (ref 13.0–17.0)
Immature Granulocytes: 0 %
Lymphocytes Relative: 15 %
Lymphs Abs: 0.8 10*3/uL (ref 0.7–4.0)
MCH: 27.7 pg (ref 26.0–34.0)
MCHC: 29.1 g/dL — ABNORMAL LOW (ref 30.0–36.0)
MCV: 95 fL (ref 80.0–100.0)
Monocytes Absolute: 0.7 10*3/uL (ref 0.1–1.0)
Monocytes Relative: 13 %
Neutro Abs: 3.1 10*3/uL (ref 1.7–7.7)
Neutrophils Relative %: 60 %
Platelets: 240 10*3/uL (ref 150–400)
RBC: 4.01 MIL/uL — ABNORMAL LOW (ref 4.22–5.81)
RDW: 17.6 % — ABNORMAL HIGH (ref 11.5–15.5)
WBC: 5.3 10*3/uL (ref 4.0–10.5)
nRBC: 0 % (ref 0.0–0.2)

## 2022-05-04 LAB — COMPREHENSIVE METABOLIC PANEL
ALT: 21 U/L (ref 0–44)
AST: 20 U/L (ref 15–41)
Albumin: 3.4 g/dL — ABNORMAL LOW (ref 3.5–5.0)
Alkaline Phosphatase: 86 U/L (ref 38–126)
Anion gap: 4 — ABNORMAL LOW (ref 5–15)
BUN: 8 mg/dL (ref 6–20)
CO2: 33 mmol/L — ABNORMAL HIGH (ref 22–32)
Calcium: 8.6 mg/dL — ABNORMAL LOW (ref 8.9–10.3)
Chloride: 102 mmol/L (ref 98–111)
Creatinine, Ser: 0.69 mg/dL (ref 0.61–1.24)
GFR, Estimated: >60 mL/min (ref >60)
Glucose, Bld: 121 mg/dL — ABNORMAL HIGH (ref 70–99)
Potassium: 4.5 mmol/L (ref 3.5–5.1)
Sodium: 139 mmol/L (ref 135–145)
Total Bilirubin: 0.5 mg/dL (ref 0.3–1.2)
Total Protein: 7 g/dL (ref 6.5–8.1)

## 2022-05-04 LAB — I-STAT VENOUS BLOOD GAS, ED
Acid-Base Excess: 4 mmol/L — ABNORMAL HIGH (ref 0.0–2.0)
Bicarbonate: 33.2 mmol/L — ABNORMAL HIGH (ref 20.0–28.0)
Calcium, Ion: 1.21 mmol/L (ref 1.15–1.40)
HCT: 38 % — ABNORMAL LOW (ref 39.0–52.0)
Hemoglobin: 12.9 g/dL — ABNORMAL LOW (ref 13.0–17.0)
O2 Saturation: 68 %
Potassium: 4.5 mmol/L (ref 3.5–5.1)
Sodium: 140 mmol/L (ref 135–145)
TCO2: 35 mmol/L — ABNORMAL HIGH (ref 22–32)
pCO2, Ven: 76.6 mmHg (ref 44–60)
pH, Ven: 7.245 — ABNORMAL LOW (ref 7.25–7.43)
pO2, Ven: 43 mmHg (ref 32–45)

## 2022-05-04 LAB — RESP PANEL BY RT-PCR (FLU A&B, COVID) ARPGX2
Influenza A by PCR: NEGATIVE
Influenza B by PCR: NEGATIVE
SARS Coronavirus 2 by RT PCR: NEGATIVE

## 2022-05-04 LAB — TROPONIN I (HIGH SENSITIVITY): Troponin I (High Sensitivity): 12 ng/L (ref <18)

## 2022-05-04 LAB — BRAIN NATRIURETIC PEPTIDE: B Natriuretic Peptide: 220.9 pg/mL — ABNORMAL HIGH (ref 0.0–100.0)

## 2022-05-04 MED ORDER — MAGNESIUM OXIDE -MG SUPPLEMENT 400 (240 MG) MG PO TABS: 400.0000 mg | ORAL_TABLET | Freq: Two times a day (BID) | ORAL | Status: DC

## 2022-05-04 MED ORDER — CARVEDILOL 12.5 MG PO TABS: 25.0000 mg | ORAL_TABLET | Freq: Two times a day (BID) | ORAL | Status: DC

## 2022-05-04 MED ORDER — FUROSEMIDE 10 MG/ML IJ SOLN: 40.0000 mg | Freq: Every day | INTRAMUSCULAR | Status: DC

## 2022-05-04 MED ORDER — FUROSEMIDE 10 MG/ML IJ SOLN: 40.0000 mg | Freq: Once | INTRAMUSCULAR | Status: DC

## 2022-05-04 MED ORDER — ACETAMINOPHEN 650 MG RE SUPP: 650.0000 mg | Freq: Four times a day (QID) | RECTAL | Status: DC | PRN

## 2022-05-04 MED ORDER — LISINOPRIL 20 MG PO TABS: 40.0000 mg | ORAL_TABLET | Freq: Every day | ORAL | Status: DC

## 2022-05-04 MED ORDER — PANTOPRAZOLE SODIUM 40 MG PO TBEC: 40.0000 mg | DELAYED_RELEASE_TABLET | Freq: Every day | ORAL | Status: DC

## 2022-05-04 MED ORDER — APIXABAN 5 MG PO TABS: 5.0000 mg | ORAL_TABLET | Freq: Two times a day (BID) | ORAL | Status: DC

## 2022-05-04 MED ORDER — SODIUM CHLORIDE 0.9% FLUSH: 3.0000 mL | INTRAVENOUS | Status: DC | PRN

## 2022-05-04 MED ORDER — SODIUM CHLORIDE 0.9 % IV SOLN: 250.0000 mL | INTRAVENOUS | Status: DC | PRN

## 2022-05-04 MED ORDER — ENOXAPARIN SODIUM 40 MG/0.4ML IJ SOSY: 40.0000 mg | PREFILLED_SYRINGE | INTRAMUSCULAR | Status: DC

## 2022-05-04 MED ORDER — SODIUM CHLORIDE 0.9% FLUSH: 3.0000 mL | Freq: Two times a day (BID) | INTRAVENOUS | Status: DC

## 2022-05-04 MED ORDER — METHOCARBAMOL 500 MG PO TABS: 500.0000 mg | ORAL_TABLET | Freq: Four times a day (QID) | ORAL | Status: DC | PRN

## 2022-05-04 MED ORDER — BISACODYL 5 MG PO TBEC: 5.0000 mg | DELAYED_RELEASE_TABLET | Freq: Every day | ORAL | Status: DC | PRN

## 2022-05-04 MED ORDER — INSULIN ASPART 100 UNIT/ML IJ SOLN: 0.0000 [IU] | Freq: Three times a day (TID) | INTRAMUSCULAR | Status: DC

## 2022-05-04 MED ORDER — ESCITALOPRAM OXALATE 10 MG PO TABS: 10.0000 mg | ORAL_TABLET | Freq: Every day | ORAL | Status: DC

## 2022-05-04 MED ORDER — INSULIN DETEMIR 100 UNIT/ML FLEXPEN: 24.0000 [IU] | PEN_INJECTOR | Freq: Every day | SUBCUTANEOUS | Status: DC

## 2022-05-04 MED ORDER — ACETAMINOPHEN 325 MG PO TABS: 650.0000 mg | ORAL_TABLET | Freq: Four times a day (QID) | ORAL | Status: DC | PRN

## 2022-05-04 MED ORDER — DOCUSATE SODIUM 100 MG PO CAPS: 100.0000 mg | ORAL_CAPSULE | Freq: Two times a day (BID) | ORAL | Status: DC

## 2022-05-04 NOTE — Assessment & Plan Note (Signed)
Has not been compliant with cpap at home which is likely part of his hypercapnia/respiratory failure bipap to help pH for now Continue  Nightly

## 2022-05-04 NOTE — Assessment & Plan Note (Signed)
A1C 10.1 in jUly 2023 Update here Continue long acting insulin SSI and accuchecks qac/hs

## 2022-05-04 NOTE — Assessment & Plan Note (Signed)
Check iron studies Overall stable around baseline

## 2022-05-04 NOTE — ED Notes (Addendum)
Patient Alert and oriented  without confusion at this time. Patient and family refused Bipap/ ABG being drawn, and care. Patient family very argumentative about treatment plan. Patient and family was explained the risk and benefits for hospital stay and leaving AMA. Family arguing with staff and security about care. Making patient very restless and tearful.  Patient continues to destat @ 82%, family and patient continues to want to leave hospital. This nurse tried to reassure that patient would not receive any sedative medications this time. MD notifed of patient refusal. Family put on patient clothes and put patient in wheelchair. IV removed. Family left with patient before AMA paper with refusal of signing AMA paper. Daughter states "He is not signing anything." As she wheeled patient out the room. Family took patient out hospital without any oxygen. MD made aware.

## 2022-05-04 NOTE — ED Notes (Signed)
Pt was becoming increasingly combative stating he wanted to go to another hospital. Pt IV was removed and wheelchair was provided. Family wheeled pt out before [facility] paperwork was signed

## 2022-05-04 NOTE — Assessment & Plan Note (Addendum)
Blood pressure initially 200/166, but has improved with no intervention Continue home coreg 25mg  Bid and lisinopril 40mg  daily

## 2022-05-04 NOTE — Assessment & Plan Note (Signed)
Continue eliquis  ?

## 2022-05-04 NOTE — Assessment & Plan Note (Addendum)
-   volume overloaded, not on daily lasix/torsemide in rehab. Had hx of AKI when initially hospitalized, unsure why not started back -BNP 220 with CXR consistent with CHF/pulmonary edema and exam with LE edema -lasix 40mg  daily -strict I/o and daily weights -echo 7/23: EF of 40-45% with mildly decreased LVF.  -continue coreg 25mg  BID

## 2022-05-04 NOTE — Progress Notes (Signed)
  X-cover Note: Received call from ED RN regarding pt's continued refusal to wear bipap. H&P reviewed. Pt just admitted between 6-7 pm. Was recently discharged from [place].  Appears to be in acute CHF, acute hypercapnic respiratory failure.  Appears pt resistant and refusing to wear bipap despite multiple attempts to convince him otherwise.  During my conversation with bedside RN, I ordered an ABG. Discussed with RN that I would come and speak with pt and family after ABG resulted.  During this conversation, RN reported that pt and family decided to leave AMA and got patient dressed.  Asked RN to have patient and family sign AMA form.   Kristopher Oppenheim, DO [facility]

## 2022-05-04 NOTE — Progress Notes (Signed)
RT attempted to get ABG from pt, but pt is refusing all blood work saying he is not staying here. RN at bedside along with pt's daughters. MD notified.

## 2022-05-04 NOTE — Progress Notes (Signed)
RT attempted to place BiPAP on pt however patient's daughters began stating to RT that patient was not going to wear the mask and that he needed to eat supper before being put on the mask because he has not ate all day.  RT attempted to explain that the patient needed to be on bipap, based on clinical knowledge of VBG results, however patient's daughters still argued with placing patient on bipap.   One daughter did also ask RT, "Why can't we just put the tube in?" And "This girl is pissing me off."  RT then looked at daughters and stated that RT would go speak with MD and have MD come and discuss plan of care with them.  MD Regenia Skeeter notified of events.

## 2022-05-04 NOTE — Assessment & Plan Note (Signed)
Optimize electrolytes Check magnesium  Keep on telemetry Avoid qt prolonging drugs  Repeat ekg in AM   

## 2022-05-04 NOTE — ED Provider Notes (Signed)
[facility] EMERGENCY DEPARTMENT Provider Note   CSN: [id number] Arrival date & time: 05/04/22  1443     History  Chief Complaint  Patient presents with   Shortness of Breath    Samuel Chavez is a 46 y.o. male.  HPI 46 year old male presents with low oxygen.  He does not wear oxygen at home.  Family at the bedside indicates that some medical provider (nurse?)  Came by the house today and his O2 sats were around 87% on room air.  They called 911 for him to be transferred here.    He denies cough, dyspnea, chest pain or fever.  He states his back is hurting which is a chronic issue.  He takes oxycodone for this but denies any new or recent change in this or extra doses.  He is currently on 5 L when I am seeing him.  I took him off oxygen and he desaturated into the high 80s and was put back on 2 L.  He states he has a history of sleep apnea.  His only complaint currently is the back pain.  Family indicates his leg swelling is gone.  It was present when he first arrived to the hospital several months ago during his prolonged admission/rehabilitation but that has resolved.  Patient has multiple comorbidities which include systolic heart failure, type 2 diabetes, hypertension.  Home Medications Prior to Admission medications   Medication Sig Start Date End Date Taking? Authorizing Provider  apixaban (ELIQUIS) 5 MG TABS tablet Take 1 tablet (5 mg total) by mouth 2 (two) times daily. 04/28/22  Yes Angiulli, Lavon Paganini, PA-C  bisacodyl (DULCOLAX) 5 MG EC tablet Take 1 tablet (5 mg total) by mouth daily as needed for moderate constipation. 04/28/22  Yes Angiulli, Lavon Paganini, PA-C  carvedilol (COREG) 25 MG tablet Take 1 tablet (25 mg total) by mouth 2 (two) times daily with a meal. 04/28/22  Yes Angiulli, Lavon Paganini, PA-C  Cholecalciferol 25 MCG (1000 UT) tablet Take 2 tablets (2,000 Units total) by mouth daily. 04/28/22  Yes Angiulli, Lavon Paganini, PA-C  docusate sodium (COLACE) 100  MG capsule Take 1 capsule (100 mg total) by mouth 2 (two) times daily. 04/28/22  Yes Angiulli, Lavon Paganini, PA-C  escitalopram (LEXAPRO) 10 MG tablet Take 1 tablet (10 mg total) by mouth daily. 04/28/22  Yes Angiulli, Lavon Paganini, PA-C  gabapentin (NEURONTIN) 600 MG tablet Take 1 tablet (600 mg total) by mouth 3 (three) times daily. 04/28/22  Yes Angiulli, Lavon Paganini, PA-C  insulin detemir (LEVEMIR) 100 UNIT/ML FlexPen Inject 24 Units into the skin 2 (two) times daily. Patient taking differently: Inject 24 Units into the skin at bedtime. 04/28/22  Yes Angiulli, Lavon Paganini, PA-C  lisinopril (ZESTRIL) 40 MG tablet Take 1 tablet (40 mg total) by mouth daily. 04/28/22  Yes Angiulli, Lavon Paganini, PA-C  LORazepam (ATIVAN) 0.5 MG tablet Take 1 tablet (0.5 mg total) by mouth every 8 (eight) hours as needed for anxiety. 04/28/22  Yes Angiulli, Lavon Paganini, PA-C  magnesium oxide (MAG-OX) 400 MG tablet Take 1 tablet (400 mg total) by mouth 2 (two) times daily. 04/28/22  Yes Angiulli, Lavon Paganini, PA-C  methocarbamol (ROBAXIN) 500 MG tablet Take 1 tablet (500 mg total) by mouth every 6 (six) hours as needed for muscle spasms (refractory to Flexeril). 04/28/22  Yes Angiulli, Lavon Paganini, PA-C  Multiple Vitamin (MULTIVITAMIN WITH MINERALS) TABS tablet Take 1 tablet by mouth daily. 04/28/22  Yes Angiulli, Lavon Paganini, PA-C  Oxycodone HCl 10 MG TABS Take 1 tablet (10 mg total) by mouth every 4 (four) hours as needed for moderate pain. 04/28/22  Yes Angiulli, Lavon Paganini, PA-C  pantoprazole (PROTONIX) 40 MG tablet Take 1 tablet (40 mg total) by mouth daily. 04/28/22  Yes Angiulli, Lavon Paganini, PA-C  Vitamin D, Ergocalciferol, (DRISDOL) 1.25 MG (50000 UNIT) CAPS capsule Take 1 capsule (50,000 Units total) by mouth every 7 (seven) days. 05/03/22  Yes Angiulli, Lavon Paganini, PA-C  acetaminophen (TYLENOL) 325 MG tablet Take 1-2 tablets (325-650 mg total) by mouth every 4 (four) hours as needed for mild pain. Patient not taking: Reported on 05/04/2022 04/28/22    Angiulli, Lavon Paganini, PA-C  diclofenac Sodium (VOLTAREN) 1 % GEL Apply 2 g topically 4 (four) times daily. Patient not taking: Reported on 05/04/2022 04/28/22   Angiulli, Lavon Paganini, PA-C  Insulin Pen Needle 32G X 4 MM MISC Use as directed up to four times daily. 04/28/22   Angiulli, Lavon Paganini, PA-C  lidocaine (LIDODERM) 5 % Place 1 patch onto the skin daily. Remove & Discard patch within 12 hours or as directed by MD Patient not taking: Reported on 05/04/2022 04/28/22   Angiulli, Lavon Paganini, PA-C  nicotine (NICODERM CQ - DOSED IN MG/24 HOURS) 14 mg/24hr patch Apply 14 mg patch daily for 2 weeks,  then switch to 7 mg patch daily for 3 weeks and stop Patient not taking: Reported on 05/04/2022 04/28/22   Angiulli, Lavon Paganini, PA-C  polyethylene glycol (MIRALAX / GLYCOLAX) 17 g packet Take 17 g by mouth daily. 04/28/22   Angiulli, Lavon Paganini, PA-C      Allergies    Hydrocodone    Review of Systems   Review of Systems  Constitutional:  Negative for fever.  Respiratory:  Negative for cough and shortness of breath.   Cardiovascular:  Negative for chest pain and leg swelling.  Gastrointestinal:  Negative for abdominal pain.  Musculoskeletal:  Positive for back pain.    Physical Exam Updated Vital Signs BP (!) 162/80   Pulse 77   Temp 98.9 F (37.2 C) (Oral)   Resp 14   Ht 5\' 9"  (1.753 m)   Wt (!) 159.4 kg   SpO2 97%   BMI 51.89 kg/m  Physical Exam Vitals and nursing note reviewed.  Constitutional:      Appearance: He is well-developed. He is obese.  HENT:     Head: Normocephalic and atraumatic.  Cardiovascular:     Rate and Rhythm: Normal rate and regular rhythm.     Heart sounds: Normal heart sounds.  Pulmonary:     Effort: Pulmonary effort is normal.     Breath sounds: Examination of the left-lower field reveals rales. Rales present. No wheezing.  Abdominal:     Palpations: Abdomen is soft.     Tenderness: There is no abdominal tenderness.  Skin:    General: Skin is warm and dry.   Neurological:     Mental Status: He is alert and oriented to person, place, and time.     Comments: Patient is sleepy but easily arousable and will talk to me.  However if left alone he will seemingly quickly fall asleep or at least closes eyes.  However he is oriented to person, place, time, situation.     ED Results / Procedures / Treatments   Labs (all labs ordered are listed, but only abnormal results are displayed) Labs Reviewed  COMPREHENSIVE METABOLIC PANEL - Abnormal; Notable for the following components:  Result Value   CO2 33 (*)    Glucose, Bld 121 (*)    Calcium 8.6 (*)    Albumin 3.4 (*)    Anion gap 4 (*)    All other components within normal limits  BRAIN NATRIURETIC PEPTIDE - Abnormal; Notable for the following components:   B Natriuretic Peptide 220.9 (*)    All other components within normal limits  CBC WITH DIFFERENTIAL/PLATELET - Abnormal; Notable for the following components:   RBC 4.01 (*)    Hemoglobin 11.1 (*)    HCT 38.1 (*)    MCHC 29.1 (*)    RDW 17.6 (*)    Eosinophils Absolute 0.6 (*)    All other components within normal limits  I-STAT VENOUS BLOOD GAS, ED - Abnormal; Notable for the following components:   pH, Ven 7.245 (*)    pCO2, Ven 76.6 (*)    Bicarbonate 33.2 (*)    TCO2 35 (*)    Acid-Base Excess 4.0 (*)    HCT 38.0 (*)    Hemoglobin 12.9 (*)    All other components within normal limits  RESP PANEL BY RT-PCR (FLU A&B, COVID) ARPGX2  MAGNESIUM  HEMOGLOBIN K8L  BASIC METABOLIC PANEL  CBC  FERRITIN  IRON AND TIBC  CBG MONITORING, ED  TROPONIN I (HIGH SENSITIVITY)  TROPONIN I (HIGH SENSITIVITY)    EKG EKG Interpretation  Date/Time:  Sunday May 04 2022 14:59:50 EDT Ventricular Rate:  83 PR Interval:  177 QRS Duration: 108 QT Interval:  420 QTC Calculation: 494 R Axis:   1 Text Interpretation: Sinus rhythm Probable left atrial enlargement Abnormal R-wave progression, late transition Borderline prolonged QT interval  similar to July 2023 Confirmed by Sherwood Gambler [phone]) on 05/04/2022 3:03:32 PM  Radiology DG Chest Portable 1 View  Result Date: 05/04/2022 CLINICAL DATA:  Hypoxia EXAM: PORTABLE CHEST 1 VIEW COMPARISON:  03/05/2022 FINDINGS: Cardiomegaly. Pulmonary vascular congestion. Diffuse interstitial opacities, most pronounced within the perihilar and bibasilar regions. Suspect small bilateral pleural effusions. No pneumothorax. IMPRESSION: Findings suggestive of CHF with pulmonary edema. Electronically Signed   By: Davina Poke D.O.   On: 05/04/2022 15:39    Procedures Ultrasound ED Peripheral IV (Provider)  Date/Time: 05/04/2022 4:13 PM  Performed by: Sherwood Gambler, MD Authorized by: Sherwood Gambler, MD   Procedure details:    Indications: multiple failed IV attempts and poor IV access     Skin Prep: chlorhexidine gluconate     Location:  Right AC   Angiocath:  20 G   Bedside Ultrasound Guided: Yes     Images: archived     Patient tolerated procedure without complications: Yes     Dressing applied: Yes   .Critical Care  Performed by: Sherwood Gambler, MD Authorized by: Sherwood Gambler, MD   Critical care provider statement:    Critical care time (minutes):  45   Critical care time was exclusive of:  Separately billable procedures and treating other patients   Critical care was necessary to treat or prevent imminent or life-threatening deterioration of the following conditions:  Respiratory failure   Critical care was time spent personally by me on the following activities:  Development of treatment plan with patient or surrogate, discussions with consultants, evaluation of patient's response to treatment, examination of patient, ordering and review of laboratory studies, ordering and review of radiographic studies, ordering and performing treatments and interventions, pulse oximetry, re-evaluation of patient's condition and review of old charts     Medications Ordered in  ED  Medications  insulin aspart (novoLOG) injection 0-9 Units (has no administration in time range)  sodium chloride flush (NS) 0.9 % injection 3 mL (has no administration in time range)  sodium chloride flush (NS) 0.9 % injection 3 mL (has no administration in time range)  0.9 %  sodium chloride infusion (has no administration in time range)  acetaminophen (TYLENOL) tablet 650 mg (has no administration in time range)    Or  acetaminophen (TYLENOL) suppository 650 mg (has no administration in time range)  carvedilol (COREG) tablet 25 mg (has no administration in time range)  lisinopril (ZESTRIL) tablet 40 mg (has no administration in time range)  escitalopram (LEXAPRO) tablet 10 mg (has no administration in time range)  bisacodyl (DULCOLAX) EC tablet 5 mg (has no administration in time range)  docusate sodium (COLACE) capsule 100 mg (has no administration in time range)  magnesium oxide (MAG-OX) tablet 400 mg (has no administration in time range)  pantoprazole (PROTONIX) EC tablet 40 mg (has no administration in time range)  apixaban (ELIQUIS) tablet 5 mg (has no administration in time range)  methocarbamol (ROBAXIN) tablet 500 mg (has no administration in time range)  insulin detemir (LEVEMIR) FlexPen 24 Units (has no administration in time range)  furosemide (LASIX) injection 40 mg (has no administration in time range)    ED Course/ Medical Decision Making/ A&P                           Medical Decision Making Amount and/or Complexity of Data Reviewed Independent Historian:     Details: Multiple family members at the bedside External Data Reviewed: notes. Labs: ordered.    Details: VBG with respiratory acidosis with pH of 7.2.  Elevated BNP.  Normal troponin, normal COVID/flu Radiology: ordered and independent interpretation performed.    Details: CHF on CXR ECG/medicine tests: ordered and independent interpretation performed.    Details: No acute ischemia or  change  Risk Decision regarding hospitalization.   Patient is a little sleepy at first that does easily awaken.  I do not think he needs immediate intubation.  At first he refused all treatments and interventions such as IVs and ABG.  After discussion with him, he seems to be refusing or not compliant with multiple interventions.  However it also seems like he was not prescribed certain medications such as diuretics upon discharge.  He does not have CPAP at home and he has a history significant for sleep apnea.  Eventually he did allow me to both diagnose and treat which included an IV that I placed as above.  Work-up is consistent with CHF but also hypercarbic respiratory failure.  He again remains awake and alert and does not appear to need immediate intubation.  However I discussed that BiPAP would be helpful.  Most likely a mixed picture.  He has agreed to BiPAP though its been back-and-forth as he was hoping for a different mask than the full facemask which we do not have available.  He is on and off refusing different treatments.  I and other providers including Dr. Rogers Blocker have discussed the severity of his illness and potential downstream effects.  At this point he is not altered.  He does agree to admission and he was started on IV furosemide.  Blood pressure was very elevated on arrival though I think part that was a false measurement and it has come down though he is still hypertensive.  I think this is likely related  to the CHF and sleep apnea as above.  I doubt ACS, PE, dissection, pneumonia.  Admit to hospitalist service.        Final Clinical Impression(s) / ED Diagnoses Final diagnoses:  Acute respiratory failure with hypoxia and hypercapnia (HCC)  Acute on chronic congestive heart failure, unspecified heart failure type [facility])    Rx / DC Orders ED Discharge Orders     None         Sherwood Gambler, MD 05/04/22 2006

## 2022-05-04 NOTE — H&P (Signed)
History and Physical    Patient: Samuel Chavez HWT:[id number] DOB: Jan 07, 1976 DOA: 05/04/2022 DOS: the patient was seen and examined on 05/04/2022 PCP: [facility]  Patient coming from: Home - lives with mom and daughters. He uses a walker.    Chief Complaint: hypoxia   HPI: Samuel Chavez is a 46 y.o. male with medical history significant of  type 2 diabetes mellitus, hypertension, pancreatitis, ongoing tobacco abuse, systolic CHF, OSA/OHS, diabetic neuropathy, lumbar radiculopathy, and morbid obesity who presented to ED with complaints of hypoxia. A home health nurse came by and checked his vitals and his oxygen wa 80% on room air. He tells me he feels fine and wants to go home. He has not felt short of breath or been confused.   Denies any orthopnea, improving lower leg swelling. Does not weigh himself at home. Was not discharged on any lasix or torsemide from inpatient rehab.   Not using cpap at home. He states he never has received his cpap machine at home after being discharged from the hospital.   Denies any fever/chills, vision changes/headaches, chest pain or palpitations, shortness of breath or cough, abdominal pain, N/V/D, dysuria or leg swelling.   Just discharged from inpatient rehab on 10/10.   He does not smoke or drink alcohol. Has not smoked since he has been in rehab.   ER Course:  vitals: bp: 200/166, HR: 80, RR: 15, oxygen: 99% on 2-3L oxygen Pertinent labs: hgb: 11.1, vbg: ph: 7.245 CXR: findings suggestive of CHF with pulmonary edema  In ED: placed on bipap, given 40mg  lasix. Trh asked to admit.    Review of Systems: As mentioned in the history of present illness. All other systems reviewed and are negative. Past Medical History:  Diagnosis Date   Diabetes mellitus without complication ([place])    Hypertension    Pancreatitis    Past Surgical History:  Procedure Laterality Date   CHOLECYSTECTOMY     TRACHEOSTOMY TUBE PLACEMENT N/A 02/25/2022    Procedure: TRACHEOSTOMY;  Surgeon: Clyde Canterbury, MD;  Location: [facility] ORS;  Service: ENT;  Laterality: N/A;   Social History:  reports that he has been smoking cigarettes. He has been smoking an average of 1 pack per day. He has never used smokeless tobacco. He reports that he does not drink alcohol and does not use drugs.  Allergies  Allergen Reactions   Hydrocodone Swelling    No family history on file.  Prior to Admission medications   Medication Sig Start Date End Date Taking? Authorizing Provider  acetaminophen (TYLENOL) 325 MG tablet Take 1-2 tablets (325-650 mg total) by mouth every 4 (four) hours as needed for mild pain. 04/28/22   Angiulli, Lavon Paganini, PA-C  apixaban (ELIQUIS) 5 MG TABS tablet Take 1 tablet (5 mg total) by mouth 2 (two) times daily. 04/28/22   Angiulli, Lavon Paganini, PA-C  bisacodyl (DULCOLAX) 5 MG EC tablet Take 1 tablet (5 mg total) by mouth daily as needed for moderate constipation. 04/28/22   Angiulli, Lavon Paganini, PA-C  carvedilol (COREG) 25 MG tablet Take 1 tablet (25 mg total) by mouth 2 (two) times daily with a meal. 04/28/22   Angiulli, Lavon Paganini, PA-C  Cholecalciferol 25 MCG (1000 UT) tablet Take 2 tablets (2,000 Units total) by mouth daily. 04/28/22   Angiulli, Lavon Paganini, PA-C  diclofenac Sodium (VOLTAREN) 1 % GEL Apply 2 g topically 4 (four) times daily. 04/28/22   Angiulli, Lavon Paganini, PA-C  docusate sodium (COLACE) 100 MG capsule  Take 1 capsule (100 mg total) by mouth 2 (two) times daily. 04/28/22   Angiulli, Lavon Paganini, PA-C  escitalopram (LEXAPRO) 10 MG tablet Take 1 tablet (10 mg total) by mouth daily. 04/28/22   Angiulli, Lavon Paganini, PA-C  gabapentin (NEURONTIN) 600 MG tablet Take 1 tablet (600 mg total) by mouth 3 (three) times daily. 04/28/22   Angiulli, Lavon Paganini, PA-C  insulin detemir (LEVEMIR) 100 UNIT/ML FlexPen Inject 24 Units into the skin 2 (two) times daily. 04/28/22   Angiulli, Lavon Paganini, PA-C  Insulin Pen Needle 32G X 4 MM MISC Use as directed up to four times  daily. 04/28/22   Angiulli, Lavon Paganini, PA-C  lidocaine (LIDODERM) 5 % Place 1 patch onto the skin daily. Remove & Discard patch within 12 hours or as directed by MD 04/28/22   Angiulli, Lavon Paganini, PA-C  lisinopril (ZESTRIL) 40 MG tablet Take 1 tablet (40 mg total) by mouth daily. 04/28/22   Angiulli, Lavon Paganini, PA-C  LORazepam (ATIVAN) 0.5 MG tablet Take 1 tablet (0.5 mg total) by mouth every 8 (eight) hours as needed for anxiety. 04/28/22   Angiulli, Lavon Paganini, PA-C  magnesium oxide (MAG-OX) 400 MG tablet Take 1 tablet (400 mg total) by mouth 2 (two) times daily. 04/28/22   Angiulli, Lavon Paganini, PA-C  methocarbamol (ROBAXIN) 500 MG tablet Take 1 tablet (500 mg total) by mouth every 6 (six) hours as needed for muscle spasms (refractory to Flexeril). 04/28/22   Angiulli, Lavon Paganini, PA-C  Multiple Vitamin (MULTIVITAMIN WITH MINERALS) TABS tablet Take 1 tablet by mouth daily. 04/28/22   Angiulli, Lavon Paganini, PA-C  nicotine (NICODERM CQ - DOSED IN MG/24 HOURS) 14 mg/24hr patch Apply 14 mg patch daily for 2 weeks,  then switch to 7 mg patch daily for 3 weeks and stop 04/28/22   Angiulli, Lavon Paganini, PA-C  Oxycodone HCl 10 MG TABS Take 1 tablet (10 mg total) by mouth every 4 (four) hours as needed for moderate pain. 04/28/22   Angiulli, Lavon Paganini, PA-C  pantoprazole (PROTONIX) 40 MG tablet Take 1 tablet (40 mg total) by mouth daily. 04/28/22   Angiulli, Lavon Paganini, PA-C  polyethylene glycol (MIRALAX / GLYCOLAX) 17 g packet Take 17 g by mouth daily. 04/28/22   Angiulli, Lavon Paganini, PA-C  Vitamin D, Ergocalciferol, (DRISDOL) 1.25 MG (50000 UNIT) CAPS capsule Take 1 capsule (50,000 Units total) by mouth every 7 (seven) days. 05/03/22   Cathlyn Parsons, PA-C    Physical Exam: Vitals:   05/04/22 1545 05/04/22 1630 05/04/22 1700 05/04/22 1900  BP: (!) 200/166 (!) 145/89 (!) 99/41 (!) 162/80  Pulse: 80 80 (!) 119 77  Resp: 15 17 12 14   SpO2: 99% 94% 91% 97%  Weight:      Height:       General:  Appears calm and comfortable and  is in NAD, morbidly obese.  Eyes:  PERRL, EOMI, normal lids, iris ENT:  grossly normal hearing, lips & tongue, mmm; appropriate dentition Neck:  no LAD, masses or thyromegaly; no carotid bruits, no JVD  Cardiovascular:  RRR, no m/r/g. 1+ LLE, trace on right LE  Respiratory:   CTA bilaterally with no wheezes/rales/rhonchi.  Normal respiratory effort. Abdomen:  soft, NT, ND, NABS Back:   normal alignment, no CVAT Skin:  no rash or induration seen on limited exam Musculoskeletal:  grossly normal tone BUE/BLE, good ROM, no bony abnormality Lower extremity:  No LE edema.  Limited foot exam with no ulcerations.  2+ distal pulses.  Psychiatric:  grossly normal mood and affect, speech fluent and appropriate, AOx3 Neurologic:  CN 2-12 grossly intact, moves all extremities in coordinated fashion, sensation intact   Radiological Exams on Admission: Independently reviewed - see discussion in A/P where applicable  DG Chest Portable 1 View  Result Date: 05/04/2022 CLINICAL DATA:  Hypoxia EXAM: PORTABLE CHEST 1 VIEW COMPARISON:  03/05/2022 FINDINGS: Cardiomegaly. Pulmonary vascular congestion. Diffuse interstitial opacities, most pronounced within the perihilar and bibasilar regions. Suspect small bilateral pleural effusions. No pneumothorax. IMPRESSION: Findings suggestive of CHF with pulmonary edema. Electronically Signed   By: Davina Poke D.O.   On: 05/04/2022 15:39    EKG: Independently reviewed.  NSR with rate 83; nonspecific ST changes with no evidence of acute ischemia Borderline prolonged qt   Labs on Admission: I have personally reviewed the available labs and imaging studies at the time of the admission.  Pertinent labs:   hgb: 11.1,  vbg: ph: 7.245 Bnp: 220  Assessment and Plan: Principal Problem:   Acute respiratory failure with hypoxia and hypercapnia (HCC) Active Problems:   Acute on chronic systolic CHF (congestive heart failure) (HCC)   Hypertensive urgency   OSA/OHS    Prolonged QT interval   Type 2 diabetes mellitus without complication, without long-term current use of insulin (HCC)   Acute deep vein thrombosis (DVT) of right upper extremity (HCC)   Normocytic anemia    Assessment and Plan: * Acute respiratory failure with hypoxia and hypercapnia ([place]) 46 year old with history of OSA/OHS and CHF who presented to ED for hypoxia after home health nurse found his oxygen to be 80% on room air. VBG showed ph of 7.2 with co2 of 76.6 -obs to progressive -he denies any shortness of breath, family denies any confusion  -has not received his cpap machine at home and has thus not been wearing. Likely cause of hypoxia and hypercapnia  -NPO and bipap. (he has refused x 2 to wear this and family keeps asking for him to eat even though he has already been given food and juice by RN. Both myself and EDP have discussed with him multiple times the necessity of wearing this mask and if he doesn't wear this his CO2 will continue to go up and he risks worsening decompensation and possible intubation. He will agree to wear the bipap and then refuse when RT comes into the room. Has threatened to leave and discussed it would be against medical advice and that he could go home and die or come back needing intubation) -repeat vbg after bipap  -hold ativan/oxycodone/gabapentin for now  -encourage weight loss/life style modification  -start IV lasix for CHF exacerbation   Acute on chronic systolic CHF (congestive heart failure) (HCC) - volume overloaded, not on daily lasix/torsemide in rehab. Had hx of AKI when initially hospitalized, unsure why not started back -BNP 220 with CXR consistent with CHF/pulmonary edema and exam with LE edema -lasix 40mg  daily -strict I/o and daily weights -echo 7/23: EF of 40-45% with mildly decreased LVF.  -continue coreg 25mg  BID   Hypertensive urgency Blood pressure initially 200/166, but has improved with no intervention Continue home coreg 25mg   Bid and lisinopril 40mg  daily   OSA/OHS Has not been compliant with cpap at home which is likely part of his hypercapnia/respiratory failure bipap to help pH for now Continue  Nightly   Prolonged QT interval Optimize electrolytes Check magnesium  Keep on telemetry Avoid qt prolonging drugs  Repeat ekg in AM  Acute deep vein thrombosis (DVT) of right upper extremity (HCC) Continue eliquis   Type 2 diabetes mellitus without complication, without long-term current use of insulin (HCC) A1C 10.1 in jUly 2023 Update here Continue long acting insulin SSI and accuchecks qac/hs   Normocytic anemia Check iron studies Overall stable around baseline     Advance Care Planning:   Code Status: Full Code   Consults: none   DVT Prophylaxis: eliquis   Family Communication: daughters at bedside.   Severity of Illness: The appropriate patient status for this patient is INPATIENT. Inpatient status is judged to be reasonable and necessary in order to provide the required intensity of service to ensure the patient's safety. The patient's presenting symptoms, physical exam findings, and initial radiographic and laboratory data in the context of their chronic comorbidities is felt to place them at high risk for further clinical deterioration. Furthermore, it is not anticipated that the patient will be medically stable for discharge from the hospital within 2 midnights of admission.   * I certify that at the point of admission it is my clinical judgment that the patient will require inpatient hospital care spanning beyond 2 midnights from the point of admission due to high intensity of service, high risk for further deterioration and high frequency of surveillance required.*  Author: Orma Flaming, MD 05/04/2022 7:28 PM  For on call review [email or web address].

## 2022-05-04 NOTE — Assessment & Plan Note (Addendum)
46 year old with history of OSA/OHS and CHF who presented to ED for hypoxia after home health nurse found his oxygen to be 80% on room air. VBG showed ph of 7.2 with co2 of 76.6 -obs to progressive -he denies any shortness of breath, family denies any confusion  -has not received his cpap machine at home and has thus not been wearing. Likely cause of hypoxia and hypercapnia  -NPO and bipap. (he has refused x 2 to wear this and family keeps asking for him to eat even though he has already been given food and juice by RN. Both myself and EDP have discussed with him multiple times the necessity of wearing this mask and if he doesn't wear this his CO2 will continue to go up and he risks worsening decompensation and possible intubation. He will agree to wear the bipap and then refuse when RT comes into the room. Has threatened to leave and discussed it would be against medical advice and that he could go home and die or come back needing intubation) -repeat vbg after bipap  -hold ativan/oxycodone/gabapentin for now  -encourage weight loss/life style modification  -start IV lasix for CHF exacerbation

## 2022-05-04 NOTE — ED Notes (Addendum)
Patient alert and oriented without any confusion.RT explained to patient and family ABG results and why bipap was needed. Patient very tearful due to family arguing about care, and any sedative medications. Patient SPO @85 % and patient very SOB. Patient and family was explain to on the reasons of needing bipap from this Probation officer. Family continues to be argumentative about care.Family was explained to that no sedative medication are being given at this time. Family threaten to  take patient out of hospital to another one. MD made aware.

## 2022-05-06 ENCOUNTER — Telehealth: Payer: Self-pay

## 2022-05-06 NOTE — Telephone Encounter (Signed)
In home nursing orders okay given for once a week for 3 weeks. To work on respiratory,  CHF issue, & Adl's (Discharge order reviewed). Okay given to [facility] with Alvis Lemmings [phone]  Kenney Houseman RN also reported Mr. Hosick  O2 was in the 69's. EMS was called and he went to the ED.   Per daughter he refused to stay in the hospital and went home. Patient is at home now resting. Tonya RN has been advised to call Mr. Worth  PCP for follow up advice (DM, CHF). Dr. Curlene Dolphin has been informed and agreed.

## 2022-05-08 ENCOUNTER — Encounter: Payer: No Typology Code available for payment source | Admitting: Physical Medicine & Rehabilitation

## 2022-05-09 ENCOUNTER — Telehealth: Payer: Self-pay

## 2022-05-09 NOTE — Telephone Encounter (Signed)
Verbal okay given to Roselie Awkward PT with Alvis Lemmings to provide in home therapy. To work on mobility, strength, and education on CHF. With a frequency of twice a week for 2 weeks.

## 2022-05-15 ENCOUNTER — Telehealth: Payer: Self-pay | Admitting: Physical Medicine & Rehabilitation

## 2022-05-15 NOTE — Telephone Encounter (Signed)
Patient being discharged from [facility] for PT due to missed appts and lack of participation.  Patient is starting outpatient therapy on the 7th.

## 2022-05-22 ENCOUNTER — Encounter: Payer: Medicaid Other | Attending: Physical Medicine & Rehabilitation | Admitting: Physical Medicine & Rehabilitation

## 2022-05-22 ENCOUNTER — Encounter: Payer: Self-pay | Admitting: *Deleted

## 2022-05-22 NOTE — Telephone Encounter (Signed)
Opened in error

## 2022-05-23 DIAGNOSIS — Z7901 Long term (current) use of anticoagulants: Secondary | ICD-10-CM

## 2022-05-23 DIAGNOSIS — Z87891 Personal history of nicotine dependence: Secondary | ICD-10-CM

## 2022-05-23 DIAGNOSIS — F419 Anxiety disorder, unspecified: Secondary | ICD-10-CM

## 2022-05-23 DIAGNOSIS — J9601 Acute respiratory failure with hypoxia: Secondary | ICD-10-CM

## 2022-05-23 DIAGNOSIS — D649 Anemia, unspecified: Secondary | ICD-10-CM

## 2022-05-23 DIAGNOSIS — R1310 Dysphagia, unspecified: Secondary | ICD-10-CM

## 2022-05-23 DIAGNOSIS — K5790 Diverticulosis of intestine, part unspecified, without perforation or abscess without bleeding: Secondary | ICD-10-CM

## 2022-05-23 DIAGNOSIS — J9602 Acute respiratory failure with hypercapnia: Secondary | ICD-10-CM

## 2022-05-23 DIAGNOSIS — Z6841 Body Mass Index (BMI) 40.0 and over, adult: Secondary | ICD-10-CM

## 2022-05-23 DIAGNOSIS — I110 Hypertensive heart disease with heart failure: Secondary | ICD-10-CM | POA: Diagnosis not present

## 2022-05-23 DIAGNOSIS — Z9181 History of falling: Secondary | ICD-10-CM

## 2022-05-23 DIAGNOSIS — E1140 Type 2 diabetes mellitus with diabetic neuropathy, unspecified: Secondary | ICD-10-CM | POA: Diagnosis not present

## 2022-05-23 DIAGNOSIS — M1711 Unilateral primary osteoarthritis, right knee: Secondary | ICD-10-CM

## 2022-05-23 DIAGNOSIS — F329 Major depressive disorder, single episode, unspecified: Secondary | ICD-10-CM

## 2022-05-23 DIAGNOSIS — I5023 Acute on chronic systolic (congestive) heart failure: Secondary | ICD-10-CM | POA: Diagnosis not present

## 2022-05-23 DIAGNOSIS — E662 Morbid (severe) obesity with alveolar hypoventilation: Secondary | ICD-10-CM

## 2022-05-23 DIAGNOSIS — Z794 Long term (current) use of insulin: Secondary | ICD-10-CM

## 2022-05-23 DIAGNOSIS — G7281 Critical illness myopathy: Secondary | ICD-10-CM | POA: Diagnosis not present

## 2022-05-23 DIAGNOSIS — I82621 Acute embolism and thrombosis of deep veins of right upper extremity: Secondary | ICD-10-CM

## 2022-06-24 ENCOUNTER — Emergency Department (HOSPITAL_BASED_OUTPATIENT_CLINIC_OR_DEPARTMENT_OTHER)
Admission: EM | Admit: 2022-06-24 | Discharge: 2022-06-24 | Disposition: A | Discharge status: Home / Self Care | Payer: Medicaid Other | Attending: Emergency Medicine | Admitting: Emergency Medicine

## 2022-06-24 ENCOUNTER — Encounter (HOSPITAL_BASED_OUTPATIENT_CLINIC_OR_DEPARTMENT_OTHER): Payer: Self-pay

## 2022-06-24 ENCOUNTER — Emergency Department (HOSPITAL_BASED_OUTPATIENT_CLINIC_OR_DEPARTMENT_OTHER): Payer: Medicaid Other

## 2022-06-24 ENCOUNTER — Other Ambulatory Visit: Payer: Self-pay

## 2022-06-24 DIAGNOSIS — Z7901 Long term (current) use of anticoagulants: Secondary | ICD-10-CM | POA: Insufficient documentation

## 2022-06-24 DIAGNOSIS — R0602 Shortness of breath: Secondary | ICD-10-CM | POA: Diagnosis not present

## 2022-06-24 DIAGNOSIS — M545 Low back pain, unspecified: Secondary | ICD-10-CM

## 2022-06-24 DIAGNOSIS — Z794 Long term (current) use of insulin: Secondary | ICD-10-CM | POA: Diagnosis not present

## 2022-06-24 DIAGNOSIS — E119 Type 2 diabetes mellitus without complications: Secondary | ICD-10-CM | POA: Diagnosis not present

## 2022-06-24 LAB — CBC WITH DIFFERENTIAL/PLATELET
Abs Immature Granulocytes: 0.01 10*3/uL (ref 0.00–0.07)
Basophils Absolute: 0 10*3/uL (ref 0.0–0.1)
Basophils Relative: 0 %
Eosinophils Absolute: 0.4 10*3/uL (ref 0.0–0.5)
Eosinophils Relative: 6 %
HCT: 43 % (ref 39.0–52.0)
Hemoglobin: 12.8 g/dL — ABNORMAL LOW (ref 13.0–17.0)
Immature Granulocytes: 0 %
Lymphocytes Relative: 19 %
Lymphs Abs: 1.4 10*3/uL (ref 0.7–4.0)
MCH: 26.3 pg (ref 26.0–34.0)
MCHC: 29.8 g/dL — ABNORMAL LOW (ref 30.0–36.0)
MCV: 88.5 fL (ref 80.0–100.0)
Monocytes Absolute: 0.8 10*3/uL (ref 0.1–1.0)
Monocytes Relative: 11 %
Neutro Abs: 4.8 10*3/uL (ref 1.7–7.7)
Neutrophils Relative %: 64 %
Platelets: 215 10*3/uL (ref 150–400)
RBC: 4.86 MIL/uL (ref 4.22–5.81)
RDW: 15.1 % (ref 11.5–15.5)
WBC: 7.5 10*3/uL (ref 4.0–10.5)
nRBC: 0 % (ref 0.0–0.2)

## 2022-06-24 LAB — I-STAT VENOUS BLOOD GAS, ED
Acid-Base Excess: 3 mmol/L — ABNORMAL HIGH (ref 0.0–2.0)
Bicarbonate: 31 mmol/L — ABNORMAL HIGH (ref 20.0–28.0)
Calcium, Ion: 1.14 mmol/L — ABNORMAL LOW (ref 1.15–1.40)
HCT: 44 % (ref 39.0–52.0)
Hemoglobin: 15 g/dL (ref 13.0–17.0)
O2 Saturation: 81 %
Potassium: 3.3 mmol/L — ABNORMAL LOW (ref 3.5–5.1)
Sodium: 140 mmol/L (ref 135–145)
TCO2: 33 mmol/L — ABNORMAL HIGH (ref 22–32)
pCO2, Ven: 62.4 mmHg — ABNORMAL HIGH (ref 44–60)
pH, Ven: 7.305 (ref 7.25–7.43)
pO2, Ven: 51 mmHg — ABNORMAL HIGH (ref 32–45)

## 2022-06-24 LAB — BASIC METABOLIC PANEL
Anion gap: 7 (ref 5–15)
BUN: 8 mg/dL (ref 6–20)
CO2: 33 mmol/L — ABNORMAL HIGH (ref 22–32)
Calcium: 8.2 mg/dL — ABNORMAL LOW (ref 8.9–10.3)
Chloride: 100 mmol/L (ref 98–111)
Creatinine, Ser: 0.77 mg/dL (ref 0.61–1.24)
GFR, Estimated: >60 mL/min (ref >60)
Glucose, Bld: 231 mg/dL — ABNORMAL HIGH (ref 70–99)
Potassium: 3.3 mmol/L — ABNORMAL LOW (ref 3.5–5.1)
Sodium: 140 mmol/L (ref 135–145)

## 2022-06-24 LAB — BRAIN NATRIURETIC PEPTIDE: B Natriuretic Peptide: 74.2 pg/mL (ref 0.0–100.0)

## 2022-06-24 LAB — TROPONIN I (HIGH SENSITIVITY): Troponin I (High Sensitivity): 18 ng/L — ABNORMAL HIGH (ref <18)

## 2022-06-24 MED ORDER — ACETAMINOPHEN 325 MG PO TABS
650.0000 mg | ORAL_TABLET | Freq: Once | ORAL | Status: AC
  Administered 2022-06-24: 650 mg via ORAL
  Filled 2022-06-24: qty 2

## 2022-06-24 MED ORDER — OXYCODONE HCL 5 MG PO TABS
5.0000 mg | ORAL_TABLET | Freq: Once | ORAL | Status: AC
  Administered 2022-06-24: 5 mg via ORAL
  Filled 2022-06-24: qty 1

## 2022-06-24 MED ORDER — LIDOCAINE 5 % EX PTCH: 1.0000 | MEDICATED_PATCH | CUTANEOUS | 0 refills | Status: AC

## 2022-06-24 MED ORDER — DICLOFENAC SODIUM 1 % EX GEL: 2.0000 g | Freq: Four times a day (QID) | CUTANEOUS | 0 refills | Status: AC

## 2022-06-24 NOTE — ED Notes (Signed)
SpO2 85% on r/a after moving to bed in room 4, placed on 2lpm O2NC

## 2022-06-24 NOTE — ED Provider Notes (Addendum)
[facility] EMER[facility]ENC[facility] DEPARTMENT Provider Note   Samuel Chavez: [id number] Arrival date & time: 06/24/22  2778     History  Chief Complaint  Patient presents with   Back Pain    lumbar    Samuel Chavez is a 46 y.o. male.  Patient here with acute on chronic back pain.  Had prolonged hospital stay earlier this year.  He has been wheelchair-bound since then.  Having ongoing pain.  Been using some muscle relaxants, lidocaine patches, Voltaren.  He was supposed to go to court today but came here because he is having pain.  He needs a note for excuse.  He seems sleepy and not sleeping well at home he states.  He denies any respiratory symptoms.  However family member in the room says he falls asleep all the time.  He supposed to have a CPAP finally later this week.  He has sleep apnea.  Has heart failure.  He is on blood thinners for suspected blood clots.  Overall he denies any chest pain.  Denies any trauma.  Denies any falls.  He denies taking any narcotic pain medicine.  The history is provided by the patient.       Home Medications Prior to Admission medications   Medication Sig Start Date End Date Taking? Authorizing Provider  acetaminophen (TYLENOL) 325 MG tablet Take 1-2 tablets (325-650 mg total) by mouth every 4 (four) hours as needed for mild pain. Patient not taking: Reported on 05/04/2022 04/28/22   Angiulli, Lavon Paganini, PA-C  apixaban (ELIQUIS) 5 MG TABS tablet Take 1 tablet (5 mg total) by mouth 2 (two) times daily. 04/28/22   Angiulli, Lavon Paganini, PA-C  bisacodyl (DULCOLAX) 5 MG EC tablet Take 1 tablet (5 mg total) by mouth daily as needed for moderate constipation. 04/28/22   Angiulli, Lavon Paganini, PA-C  carvedilol (COREG) 25 MG tablet Take 1 tablet (25 mg total) by mouth 2 (two) times daily with a meal. 04/28/22   Angiulli, Lavon Paganini, PA-C  Cholecalciferol 25 MCG (1000 UT) tablet Take 2 tablets (2,000 Units total) by mouth daily. 04/28/22   Angiulli, Lavon Paganini, PA-C  diclofenac  Sodium (VOLTAREN) 1 % GEL Apply 2 g topically 4 (four) times daily. 06/24/22   Hudson Lehmkuhl, DO  docusate sodium (COLACE) 100 MG capsule Take 1 capsule (100 mg total) by mouth 2 (two) times daily. 04/28/22   Angiulli, Lavon Paganini, PA-C  escitalopram (LEXAPRO) 10 MG tablet Take 1 tablet (10 mg total) by mouth daily. 04/28/22   Angiulli, Lavon Paganini, PA-C  gabapentin (NEURONTIN) 600 MG tablet Take 1 tablet (600 mg total) by mouth 3 (three) times daily. 04/28/22   Angiulli, Lavon Paganini, PA-C  insulin detemir (LEVEMIR) 100 UNIT/ML FlexPen Inject 24 Units into the skin 2 (two) times daily. Patient taking differently: Inject 24 Units into the skin at bedtime. 04/28/22   Angiulli, Lavon Paganini, PA-C  Insulin Pen Needle 32G X 4 MM MISC Use as directed up to four times daily. 04/28/22   Angiulli, Lavon Paganini, PA-C  lidocaine (LIDODERM) 5 % Place 1 patch onto the skin daily. Remove & Discard patch within 12 hours or as directed by MD 06/24/22   Lennice Sites, DO  lisinopril (ZESTRIL) 40 MG tablet Take 1 tablet (40 mg total) by mouth daily. 04/28/22   Angiulli, Lavon Paganini, PA-C  LORazepam (ATIVAN) 0.5 MG tablet Take 1 tablet (0.5 mg total) by mouth every 8 (eight) hours as needed for anxiety. 04/28/22   Lauraine Rinne  J, PA-C  magnesium oxide (MAG-OX) 400 MG tablet Take 1 tablet (400 mg total) by mouth 2 (two) times daily. 04/28/22   Angiulli, Lavon Paganini, PA-C  methocarbamol (ROBAXIN) 500 MG tablet Take 1 tablet (500 mg total) by mouth every 6 (six) hours as needed for muscle spasms (refractory to Flexeril). 04/28/22   Angiulli, Lavon Paganini, PA-C  Multiple Vitamin (MULTIVITAMIN WITH MINERALS) TABS tablet Take 1 tablet by mouth daily. 04/28/22   Angiulli, Lavon Paganini, PA-C  nicotine (NICODERM CQ - DOSED IN MG/24 HOURS) 14 mg/24hr patch Apply 14 mg patch daily for 2 weeks,  then switch to 7 mg patch daily for 3 weeks and stop Patient not taking: Reported on 05/04/2022 04/28/22   Angiulli, Lavon Paganini, PA-C  Oxycodone HCl 10 MG TABS Take 1 tablet (10  mg total) by mouth every 4 (four) hours as needed for moderate pain. 04/28/22   Angiulli, Lavon Paganini, PA-C  pantoprazole (PROTONIX) 40 MG tablet Take 1 tablet (40 mg total) by mouth daily. 04/28/22   Angiulli, Lavon Paganini, PA-C  polyethylene glycol (MIRALAX / GLYCOLAX) 17 g packet Take 17 g by mouth daily. 04/28/22   Angiulli, Lavon Paganini, PA-C  Vitamin D, Ergocalciferol, (DRISDOL) 1.25 MG (50000 UNIT) CAPS capsule Take 1 capsule (50,000 Units total) by mouth every 7 (seven) days. 05/03/22   Angiulli, Lavon Paganini, PA-C      Allergies    Hydrocodone    Review of Systems   Review of Systems  Physical Exam Updated Vital Signs BP 124/82   Pulse 87   Temp 98.2 F (36.8 C) (Oral)   Resp (!) 21   SpO2 94%  Physical Exam Vitals and nursing note reviewed.  Constitutional:      General: He is not in acute distress.    Appearance: He is well-developed.  HENT:     Head: Normocephalic and atraumatic.     Nose: Nose normal.     Mouth/Throat:     Mouth: Mucous membranes are moist.  Eyes:     Extraocular Movements: Extraocular movements intact.     Conjunctiva/sclera: Conjunctivae normal.     Pupils: Pupils are equal, round, and reactive to light.  Cardiovascular:     Rate and Rhythm: Normal rate and regular rhythm.     Pulses: Normal pulses.     Heart sounds: Normal heart sounds. No murmur heard. Pulmonary:     Effort: Pulmonary effort is normal. No respiratory distress.     Breath sounds: Normal breath sounds.  Abdominal:     Palpations: Abdomen is soft.     Tenderness: There is no abdominal tenderness.  Musculoskeletal:        General: No swelling.     Cervical back: Neck supple.  Skin:    General: Skin is warm and dry.     Capillary Refill: Capillary refill takes less than 2 seconds.  Neurological:     General: No focal deficit present.     Mental Status: He is alert.     Comments: Patient falls asleep very easily on exam, wakes up with voice, seems to be at his neurologic baseline.  Says  he has Bell's palsy history and chronic speech issues from that  Psychiatric:        Mood and Affect: Mood normal.     ED Results / Procedures / Treatments   Labs (all labs ordered are listed, but only abnormal results are displayed) Labs Reviewed  CBC WITH DIFFERENTIAL/PLATELET - Abnormal; Notable for the following  components:      Result Value   Hemoglobin 12.8 (*)    MCHC 29.8 (*)    All other components within normal limits  BASIC METABOLIC PANEL - Abnormal; Notable for the following components:   Potassium 3.3 (*)    CO2 33 (*)    Glucose, Bld 231 (*)    Calcium 8.2 (*)    All other components within normal limits  I-STAT VENOUS BLOOD GAS, ED - Abnormal; Notable for the following components:   pCO2, Ven 62.4 (*)    pO2, Ven 51 (*)    Bicarbonate 31.0 (*)    TCO2 33 (*)    Acid-Base Excess 3.0 (*)    Potassium 3.3 (*)    Calcium, Ion 1.14 (*)    All other components within normal limits  TROPONIN I (HIGH SENSITIVITY) - Abnormal; Notable for the following components:   Troponin I (High Sensitivity) 18 (*)    All other components within normal limits  BRAIN NATRIURETIC PEPTIDE  BLOOD GAS, VENOUS    EKG EKG Interpretation  Date/Time:  Tuesday June 24 2022 10:12:21 EST Ventricular Rate:  85 PR Interval:  186 QRS Duration: 100 QT Interval:  416 QTC Calculation: 495 R Axis:   -32 Text Interpretation: Sinus rhythm Left atrial enlargement Left axis deviation Borderline prolonged QT interval Confirmed by Lennice Sites ([id number]) on 06/24/2022 10:35:44 AM  Radiology DG Chest Portable 1 View  Result Date: 06/24/2022 CLINICAL DATA:  Shortness of breath. EXAM: PORTABLE CHEST 1 VIEW COMPARISON:  May 04, 2022. FINDINGS: Stable cardiomegaly. Mild central pulmonary vascular congestion is noted with possible bibasilar pulmonary edema. Bony thorax is unremarkable. IMPRESSION: Stable cardiomegaly with mild central pulmonary vascular congestion and possible bibasilar edema.  Electronically Signed   By: Marijo Conception M.D.   On: 06/24/2022 11:23    Procedures Procedures    Medications Ordered in ED Medications  acetaminophen (TYLENOL) tablet 650 mg (650 mg Oral Given 06/24/22 1155)  oxyCODONE (Oxy IR/ROXICODONE) immediate release tablet 5 mg (5 mg Oral Given 06/24/22 1154)    ED Course/ Medical Decision Making/ A&P                           Medical Decision Making Amount and/or Complexity of Data Reviewed Labs: ordered. Radiology: ordered.  Risk OTC drugs. Prescription drug management.   BLONG BUSK is here for acute on chronic back pain.  Wheelchair-bound.  History of heart failure, blood clots on Eliquis.  On fluid pills.  History of diabetes.  He is mostly here for back pain.  Denies taking anything other than his prescribed home medications.  He is not on narcotic pain medicine anymore.  He states he mostly came because he needs a note for court that he missed today.  He was noticed in triage to fall asleep easily.  He was having hypoxic events.  When he is awake and talk to me appears to have normal oxygen levels but he does fall asleep at times during my interview.  His family member at the bedside states that he falls asleep constantly at home.  It appears that he has sleep apnea and is supposed have a CPAP machine hopefully by the end of the week.  But not currently using 1.  He states that he has been able to sleep well because his granddaughter has not been doing well and was keeping him up late at night as well.  Overall he appears  well he is in no major distress but per my chart review sounds like he had a similar presentation a month and a half ago and was found to be hypercarbic thought to be secondary to volume overload, sleep apnea.  I think I need to evaluate for this as well as he is falling asleep at times during my interview with some episodes of hypoxia.  Back pain appears to be acute on chronic.  He is at his neurologic baseline.  Will  make sure he has refills of his chronic medications for this assuming other workup is unremarkable.  Will get CBC, BMP, troponin, BNP, chest x-ray, blood gas.  EKG shows sinus rhythm.  No ischemic changes.  VBG is reassuring.  Blood work otherwise per my review and interpretation shows no evidence of electrolyte abnormality or kidney injury.  BNP and troponin are unremarkable.  EKG with no ischemic changes.  Doubt ACS or volume overload.  Chest x-ray per my review and interpretation shows no evidence of pneumonia or volume overload.  Patient feeling much better.  He has been more awake and alert.  Will give him a dose of oxycodone for breakthrough pain while he is here but will refill his chronic medications for pain management he will need to follow-up closely with primary care doctor.  Patient discharged in good condition.  Strongly encouraged that when he gets the CPAP that he wears it.    This chart was dictated using voice recognition software.  Despite best efforts to proofread,  errors can occur which can change the documentation meaning.         Final Clinical Impression(s) / ED Diagnoses Final diagnoses:  Acute low back pain without sciatica, unspecified back pain laterality    Rx / DC Orders ED Discharge Orders          Ordered    lidocaine (LIDODERM) 5 %  Every 24 hours        06/24/22 1157    diclofenac Sodium (VOLTAREN) 1 % GEL  4 times daily        06/24/22 [place], Hokulani Rogel, DO 06/24/22 1157    Tazia Illescas, DO 06/24/22 1203

## 2022-06-24 NOTE — ED Triage Notes (Signed)
Pt c/o difficulty completing ADLs at home, low back pain, "hard for me to get situated & move." Reports recent hospitalization at [facility] where "one minute I was in the waiting room & I woke up 23mos later." Pt denies incontinence, numbness/tingling in BLE

## 2022-06-24 NOTE — ED Notes (Signed)
Pt O2 noted to be 89% on RA; RT called to assess pt in triage

## 2022-10-20 DEATH — deceased

## 2024-04-13 ENCOUNTER — Other Ambulatory Visit (HOSPITAL_COMMUNITY): Payer: Self-pay
